# Patient Record
Sex: Female | Born: 1960 | Race: White | Hispanic: No | Marital: Single | Smoking: Never smoker
Health system: Southern US, Community
[De-identification: ages and names within clinical notes are randomized; demographics above are authoritative.]

## PROBLEM LIST (undated history)

## (undated) DIAGNOSIS — Z22322 Carrier or suspected carrier of Methicillin resistant Staphylococcus aureus: Secondary | ICD-10-CM

## (undated) DIAGNOSIS — D8940 Mast cell activation, unspecified: Secondary | ICD-10-CM

## (undated) DIAGNOSIS — I456 Pre-excitation syndrome: Secondary | ICD-10-CM

## (undated) DIAGNOSIS — E079 Disorder of thyroid, unspecified: Secondary | ICD-10-CM

## (undated) DIAGNOSIS — I441 Atrioventricular block, second degree: Secondary | ICD-10-CM

## (undated) DIAGNOSIS — F419 Anxiety disorder, unspecified: Secondary | ICD-10-CM

## (undated) DIAGNOSIS — D839 Common variable immunodeficiency, unspecified: Secondary | ICD-10-CM

## (undated) LAB — HM DIABETES EYE EXAM: HM Diabetes Eye Exam: NORMAL

## (undated) LAB — HM DM FOOT MONOFILAMENT AND PULSE

## (undated) LAB — GLUCOSE POC NURSING: POC Glucose: 133

## (undated) LAB — HEMOGLOBIN A1C: Hemoglobin A1C: 6.6

## (undated) MED FILL — NEBULIZER AND COMPRESSOR: 1.00 1.00 ampule | Qty: 1 | Fill #0

## (undated) MED FILL — VILAZODONE 10 MG (7)-20 MG (23) TABLETS IN A TITRATION PACK: 10 10 mg (7)- 20 mg (23) | ORAL | 30 days supply | Qty: 30 | Fill #0

## (undated) MED FILL — LEVALBUTEROL 1.25 MG/3 ML SOLUTION FOR NEBULIZATION: 1.25 1.25 mg/3 mL | RESPIRATORY_TRACT | 6 days supply | Qty: 72 | Fill #0

## (undated) MED FILL — CROMOLYN 100 MG/5 ML ORAL CONCENTRATE: 100 100 mg/5 mL | ORAL | 32 days supply | Qty: 960 | Fill #0

## (undated) MED FILL — CETIRIZINE 10 MG TABLET: 10 10 MG | ORAL | 30 days supply | Qty: 90 | Fill #0

## (undated) MED FILL — DIPHENHYDRAMINE 50 MG CAPSULE: 50 50 MG | ORAL | 30 days supply | Qty: 90 | Fill #0

---

## 2015-09-27 ENCOUNTER — Encounter (HOSPITAL_COMMUNITY): Payer: Self-pay | Admitting: Emergency Medicine

## 2015-09-27 ENCOUNTER — Emergency Department (HOSPITAL_COMMUNITY): Payer: Self-pay

## 2015-09-27 ENCOUNTER — Emergency Department (HOSPITAL_COMMUNITY)
Admission: EM | Admit: 2015-09-27 | Discharge: 2015-09-27 | Disposition: A | Discharge status: Home / Self Care | Payer: Self-pay | Attending: Emergency Medicine | Admitting: Emergency Medicine

## 2015-09-27 DIAGNOSIS — R002 Palpitations: Secondary | ICD-10-CM | POA: Insufficient documentation

## 2015-09-27 HISTORY — DX: Atrioventricular block, second degree: I44.1

## 2015-09-27 HISTORY — DX: Anxiety disorder, unspecified: F41.9

## 2015-09-27 HISTORY — DX: Mast cell activation, unspecified: D89.40

## 2015-09-27 HISTORY — DX: Carrier or suspected carrier of methicillin resistant Staphylococcus aureus: Z22.322

## 2015-09-27 HISTORY — DX: Pre-excitation syndrome: I45.6

## 2015-09-27 HISTORY — DX: Common variable immunodeficiency, unspecified: D83.9

## 2015-09-27 HISTORY — DX: Disorder of thyroid, unspecified: E07.9

## 2015-09-27 LAB — BASIC METABOLIC PANEL
Anion gap: 8 (ref 5–15)
BUN: <5 mg/dL — ABNORMAL LOW (ref 6–20)
CO2: 26 mmol/L (ref 22–32)
Calcium: 8.8 mg/dL — ABNORMAL LOW (ref 8.9–10.3)
Chloride: 105 mmol/L (ref 101–111)
Creatinine, Ser: 0.64 mg/dL (ref 0.44–1.00)
GFR calc Af Amer: >60 mL/min (ref >60)
GFR calc non Af Amer: >60 mL/min (ref >60)
Glucose, Bld: 91 mg/dL (ref 65–99)
Potassium: 3.1 mmol/L — ABNORMAL LOW (ref 3.5–5.1)
Sodium: 139 mmol/L (ref 135–145)

## 2015-09-27 LAB — CBC
HCT: 40.5 % (ref 36.0–46.0)
Hemoglobin: 12.5 g/dL (ref 12.0–15.0)
MCH: 29.1 pg (ref 26.0–34.0)
MCHC: 30.9 g/dL (ref 30.0–36.0)
MCV: 94.2 fL (ref 78.0–100.0)
Platelets: 388 K/uL (ref 150–400)
RBC: 4.3 MIL/uL (ref 3.87–5.11)
RDW: 14 % (ref 11.5–15.5)
WBC: 9.3 K/uL (ref 4.0–10.5)

## 2015-09-27 MED ORDER — POTASSIUM CHLORIDE CRYS ER 20 MEQ PO TBCR
40.0000 meq | EXTENDED_RELEASE_TABLET | Freq: Once | ORAL | Status: AC
  Administered 2015-09-27: 40 meq via ORAL
  Filled 2015-09-27: qty 2

## 2015-09-27 MED ORDER — SODIUM CHLORIDE 0.9 % IV BOLUS (SEPSIS)
1000.0000 mL | Freq: Once | INTRAVENOUS | Status: AC
  Administered 2015-09-27: 1000 mL via INTRAVENOUS

## 2015-09-27 MED ORDER — METHYLPREDNISOLONE 4 MG PO TBPK: ORAL_TABLET | ORAL | 0 refills | Status: DC

## 2015-09-27 MED ORDER — METHYLPREDNISOLONE SODIUM SUCC 125 MG IJ SOLR
125.0000 mg | Freq: Once | INTRAMUSCULAR | Status: AC
  Administered 2015-09-27: 125 mg via INTRAVENOUS
  Filled 2015-09-27: qty 2

## 2015-09-27 NOTE — ED Notes (Signed)
Patient transported to X-ray 

## 2015-09-27 NOTE — ED Triage Notes (Signed)
Pt. Stated, I have MRSA July 2nd and a month of hospital for the MRSA and it triggers my heart palpitations . 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

## 2015-09-27 NOTE — ED Notes (Signed)
Pt given turkey sandwich and coke 

## 2015-09-27 NOTE — ED Provider Notes (Signed)
[facility]-EMERGENCY DEPT Provider Note   CSN: [id number] Arrival date & time: 09/27/15  1348  By signing my name below, I, Joanna Rojas, attest that this documentation has been prepared under the direction and in the presence of Raeford RazorStephen Makih Stefanko, MD. Electronically Signed: Sonum Rojas, Neurosurgeoncribe. 09/27/15. 3:39 PM.  History   Chief Complaint Chief Complaint  Patient presents with  . Irregular Heart Beat  . Blood Infection   The history is provided by the patient. No language interpreter was used.     HPI Comments: Joanna Rojas is a 55 y.o. female with past medical history of mast cell activation syndrome, MRSA who presents to the Emergency Department complaining of intermittent palpitations that has been ongoing for the last 3 days. She describes her symptoms as a fluttering sensation and believes she had PVC/PAC every 4th beat yesterday. She reports also having facial swelling that started a few days ago. She had BLE fractures in June 2017 that were surgically repaired in [place]. When she was discharged from the hospital she was started on a 21 day course of Cipro for onset of pustules to different areas of her body. She reports having MRSA related pustules to the right knee today and to the face that began yesterday.  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 She denies chest pain, SOB.   Past Medical History:  Diagnosis Date  . Anxiety   . CVID (common variable immunodeficiency) (HCC)   . Heart block AV second degree   . Mast cell activation (HCC)   . MRSA (methicillin resistant staph aureus) culture positive   . Thyroid disease   . 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   History reviewed. No pertinent surgical history.  OB History    No data available       Home Medications    Prior to Admission medications   Not on File    Family History No family history  on file.  Social History Social History  Substance Use Topics  . Smoking status: Never Smoker  . Smokeless tobacco: Never Used  . Alcohol use No     Allergies   Review of patient's allergies indicates not on file.   Review of Systems Review of Systems  HENT: Positive for facial swelling.   Respiratory: Negative for shortness of breath.   Cardiovascular: Positive for palpitations. Negative for chest pain.  Skin: Positive for wound.  All other systems reviewed and are negative.    Physical Exam Updated Vital Signs BP 128/79 (BP Location: Left Arm)   Pulse 103   Temp 98.3 F (36.8 C) (Oral)   Resp 14   SpO2 99%   Physical Exam  Constitutional: She is oriented to person, place, and time. She appears well-developed and well-nourished. No distress.  HENT:  Head: Normocephalic and atraumatic.  Eyes: EOM are normal.  Neck: Normal range of motion.  Cardiovascular: Normal rate, regular rhythm and normal heart sounds.   Pulmonary/Chest: Effort normal and breath sounds normal.  Abdominal: Soft. She exhibits no distension. There is no tenderness.  Musculoskeletal: Normal range of motion.  Neurological: She is alert and oriented to person, place, and time.  Skin: Skin is warm and dry.  Small pustule to right knee. No surrounding cellulitis. Very small ulceration to the dorsal aspect of the left index finger. Scaled and scabbed lesion to lower lip.   Psychiatric: She has  a normal mood and affect. Judgment normal.  Nursing note and vitals reviewed.    ED Treatments / Results  DIAGNOSTIC STUDIES: Oxygen Saturation is 99% on RA, normal by my interpretation.    COORDINATION OF CARE: 3:39 PM Discussed treatment plan with pt at bedside and pt agreed to plan.   Labs (all labs ordered are listed, but only abnormal results are displayed) Labs Reviewed  CBC  BASIC METABOLIC PANEL    EKG  EKG Interpretation  Date/Time:  Saturday September 27 2015 13:54:52 EDT Ventricular  Rate:  102 PR Interval:    QRS Duration: 80 QT Interval:  532 QTC Calculation: 693 R Axis:   78 Text Interpretation:  Sinus rhythm Premature ventricular complexes Nonspecific ST and T wave abnormality Prolonged QT Abnormal ECG Confirmed by Juleen China  MD, Jeannett Senior ([id number]) on 09/27/2015 3:14:48 PM       Radiology No results found.  Procedures Procedures (including critical care time)  Medications Ordered in ED Medications - No data to display   Initial Impression / Assessment and Plan / ED Course  I have reviewed the triage vital signs and the nursing notes.  Pertinent labs & imaging results that were available during my care of the patient were reviewed by me and considered in my medical decision making (see chart for details).  Clinical Course   55yF with palpitations. Likely feeling PVCs. She has several other complaints including skin lesions. Sparse red papules to extremities. They appear in differing ages of chronicity. No cellulitis. She is currently on abx. I see no reason to change therapy.    Final Clinical Impressions(s) / ED Diagnoses   Final diagnoses:  Palpitations    New Prescriptions New Prescriptions   No medications on file   I personally preformed the services scribed in my presence. The recorded information has been reviewed is accurate. Raeford Razor, MD.    Raeford Razor, MD 10/01/15 [phone]

## 2015-11-18 ENCOUNTER — Encounter (HOSPITAL_COMMUNITY): Payer: Self-pay

## 2015-11-18 ENCOUNTER — Emergency Department (HOSPITAL_COMMUNITY): Payer: Medicare Other

## 2015-11-18 ENCOUNTER — Emergency Department (HOSPITAL_COMMUNITY)
Admission: EM | Admit: 2015-11-18 | Discharge: 2015-11-18 | Disposition: A | Discharge status: Home / Self Care | Payer: Medicare Other | Attending: Emergency Medicine | Admitting: Emergency Medicine

## 2015-11-18 DIAGNOSIS — Z79899 Other long term (current) drug therapy: Secondary | ICD-10-CM | POA: Insufficient documentation

## 2015-11-18 DIAGNOSIS — Y929 Unspecified place or not applicable: Secondary | ICD-10-CM | POA: Insufficient documentation

## 2015-11-18 DIAGNOSIS — S39012A Strain of muscle, fascia and tendon of lower back, initial encounter: Secondary | ICD-10-CM | POA: Diagnosis not present

## 2015-11-18 DIAGNOSIS — S199XXA Unspecified injury of neck, initial encounter: Secondary | ICD-10-CM | POA: Diagnosis present

## 2015-11-18 DIAGNOSIS — W19XXXA Unspecified fall, initial encounter: Secondary | ICD-10-CM | POA: Insufficient documentation

## 2015-11-18 DIAGNOSIS — S161XXA Strain of muscle, fascia and tendon at neck level, initial encounter: Secondary | ICD-10-CM | POA: Diagnosis not present

## 2015-11-18 DIAGNOSIS — R51 Headache: Secondary | ICD-10-CM | POA: Insufficient documentation

## 2015-11-18 DIAGNOSIS — Y939 Activity, unspecified: Secondary | ICD-10-CM | POA: Diagnosis not present

## 2015-11-18 DIAGNOSIS — Y999 Unspecified external cause status: Secondary | ICD-10-CM | POA: Insufficient documentation

## 2015-11-18 MED ORDER — METHYLPREDNISOLONE SODIUM SUCC 125 MG IJ SOLR
125.0000 mg | Freq: Once | INTRAMUSCULAR | Status: AC
  Administered 2015-11-18: 125 mg via INTRAVENOUS
  Filled 2015-11-18: qty 2

## 2015-11-18 MED ORDER — HYDROMORPHONE HCL 1 MG/ML IJ SOLN
1.0000 mg | Freq: Once | INTRAMUSCULAR | Status: AC
  Administered 2015-11-18: 1 mg via INTRAVENOUS
  Filled 2015-11-18: qty 1

## 2015-11-18 MED ORDER — DIPHENHYDRAMINE HCL 50 MG/ML IJ SOLN
50.0000 mg | Freq: Once | INTRAMUSCULAR | Status: AC
  Administered 2015-11-18: 50 mg via INTRAVENOUS
  Filled 2015-11-18: qty 1

## 2015-11-18 MED ORDER — SODIUM CHLORIDE 0.9 % IV SOLN
40.0000 mg | Freq: Once | INTRAVENOUS | Status: AC
  Administered 2015-11-18: 40 mg via INTRAVENOUS
  Filled 2015-11-18: qty 4

## 2015-11-18 NOTE — Progress Notes (Signed)
Staffed with ED Secretary and consult if for case management.  Elenore PaddyLaVonia Roel Douthat, LCSWA [phone] ED CSW 11/18/2015 8:24 AM

## 2015-11-18 NOTE — ED Notes (Signed)
Pt lives alone, the last thing patient remembers getting up to go to the bathroom and then waking up on the floor. Pt complains of neck and back pain from the fall more than she's normally hurts.

## 2015-11-18 NOTE — ED Provider Notes (Addendum)
[facility]-EMERGENCY DEPT Provider Note   CSN: [id number] Arrival date & time: 11/18/15  0535     History   Chief Complaint Chief Complaint  Patient presents with  . Loss of Consciousness    HPI Joanna Rojas is a 55 y.o. female.  Patient with history of mast cell activation resulting in frequent syncope presents to the ER after a syncopal episode. Patient reports that she did go to the bathroom, woke up on the floor. Patient is complaining of pain in her lower back as well as head and neck.  Patient also has a history of common variable immunodeficiency. She feels like her IgG is low and needs H1 and H2-blockers. She used to be on these continuously, but recently had MRSA infection causing hospitalization and her chronic IV access was removed.      Past Medical History:  Diagnosis Date  . Anxiety   . CVID (common variable immunodeficiency) (HCC)   . Heart block AV second degree   . Mast cell activation (HCC)   . MRSA (methicillin resistant staph aureus) culture positive   . Thyroid disease   . 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   History reviewed. No pertinent surgical history.  OB History    No data available       Home Medications    Prior to Admission medications   Medication Sig Start Date End Date Taking? Authorizing Provider  cetirizine (ZYRTEC) 10 MG tablet Take 20 mg by mouth every 8 (eight) hours.    Historical Provider, MD  clonazePAM (KLONOPIN) 1 MG tablet Take 1 mg by mouth 3 (three) times daily as needed for anxiety.    Historical Provider, MD  diphenhydrAMINE (BENADRYL) 50 MG/ML injection Inject 50 mg into the muscle See admin instructions. EVERY 4-6 HOURS AS NEEDED    Historical Provider, MD  ezetimibe (ZETIA) 10 MG tablet Take 10 mg by mouth every morning.    Historical Provider, MD  fexofenadine (ALLEGRA) 180 MG tablet Take 180 mg by mouth at bedtime.     Historical Provider, MD  furosemide (LASIX) 20 MG tablet Take 20-40 mg by mouth every morning. DEPENDING ON SWELLING    Historical Provider, MD  levalbuterol (XOPENEX) 1.25 MG/3ML nebulizer solution Take 1.25 mg by nebulization every 6 (six) hours as needed for wheezing or shortness of breath.    Historical Provider, MD  levothyroxine (SYNTHROID, LEVOTHROID) 112 MCG tablet Take 112 mcg by mouth daily before breakfast.    Historical Provider, MD  methylPREDNISolone (MEDROL DOSEPAK) 4 MG TBPK tablet 6,5,4,3,2,1 tablets daily. 09/27/15   Raeford RazorStephen Kohut, MD  montelukast (SINGULAIR) 10 MG tablet Take 10 mg by mouth at bedtime.    Historical Provider, MD  omeprazole (PRILOSEC) 40 MG capsule Take 40 mg by mouth 2 (two) times daily.    Historical Provider, MD  OnabotulinumtoxinA (BOTOX IM) Inject 400 Units into the muscle every 3 (three) months.    Historical Provider, MD  ranitidine (ZANTAC) 300 MG tablet Take 300 mg by mouth 3 (three) times daily.    Historical Provider, MD    Family History No family history on file.  Social History Social History  Substance Use Topics  . Smoking status: Never Smoker  . Smokeless tobacco: Never Used  . Alcohol use No     Allergies   Asa [aspirin]; Latex; Lidocaine; Lyrica [pregabalin]; Other; and Tape   Review of Systems Review of Systems  Musculoskeletal: Positive for back pain and neck pain.  Neurological: Positive for syncope and headaches.  All other systems reviewed and are negative.    Physical Exam Updated Vital Signs BP 139/97 (BP Location: Left Arm)   Pulse 67   Temp 98.3 F (36.8 C) (Oral)   Resp 18   Ht 5\' 7"  (1.702 m)   Wt 158 lb (71.7 kg)   SpO2 100%   BMI 24.75 kg/m   Physical Exam  Constitutional: She is oriented to person, place, and time. She appears well-developed and well-nourished. No distress.  HENT:  Head: Normocephalic and atraumatic.  Right Ear: Hearing normal.  Left Ear: Hearing normal.  Nose: Nose normal.    Mouth/Throat: Oropharynx is clear and moist and mucous membranes are normal.  Eyes: Conjunctivae and EOM are normal. Pupils are equal, round, and reactive to light.  Neck: Normal range of motion. Neck supple. Muscular tenderness present.  Cardiovascular: Regular rhythm, S1 normal and S2 normal.  Exam reveals no gallop and no friction rub.   No murmur heard. Pulmonary/Chest: Effort normal and breath sounds normal. No respiratory distress. She exhibits no tenderness.  Abdominal: Soft. Normal appearance and bowel sounds are normal. There is no hepatosplenomegaly. There is no tenderness. There is no rebound, no guarding, no tenderness at McBurney's point and negative Murphy's sign. No hernia.  Musculoskeletal: Normal range of motion.       Lumbar back: She exhibits tenderness.       Back:  Neurological: She is alert and oriented to person, place, and time. She has normal strength. No cranial nerve deficit or sensory deficit. Coordination normal. GCS eye subscore is 4. GCS verbal subscore is 5. GCS motor subscore is 6.  Skin: Skin is warm, dry and intact. No rash noted. No cyanosis.  Psychiatric: She has a normal mood and affect. Her speech is normal and behavior is normal. Thought content normal.  Nursing note and vitals reviewed.    ED Treatments / Results  Labs (all labs ordered are listed, but only abnormal results are displayed) Labs Reviewed - No data to display  EKG  EKG Interpretation  Date/Time:  Tuesday November 18 2015 07:04:19 EDT Ventricular Rate:  70 PR Interval:    QRS Duration: 92 QT Interval:  478 QTC Calculation: 516 R Axis:   57 Text Interpretation:  Sinus rhythm RSR' in V1 or V2, probably normal variant Nonspecific ST and T wave abnormality Prolonged QT interval No significant change since last tracing Confirmed by Blinda LeatherwoodPOLLINA  MD, Arvie Bartholomew [phone]([id number]) on 11/18/2015 7:20:10 AM       Radiology Dg Lumbar Spine Complete  Result Date: 11/18/2015 CLINICAL DATA:   Unwitnessed fall tonight. EXAM: LUMBAR SPINE - COMPLETE 4+ VIEW COMPARISON:  None. FINDINGS: There is no evidence of lumbar spine fracture. Alignment is normal. Mild degenerative lumbar disc disease, greatest at L5-S1. Sacroiliac joints appear unremarkable. IMPRESSION: Negative for acute lumbar spine fracture. Electronically Signed   By: Ellery Plunkaniel R Mitchell M.D.   On: 11/18/2015 07:00   Ct Head Wo Contrast  Result Date: 11/18/2015 CLINICAL DATA:  Unwitnessed fall. EXAM: CT HEAD WITHOUT CONTRAST CT CERVICAL SPINE WITHOUT CONTRAST TECHNIQUE: Multidetector CT imaging of the head and cervical spine was performed following the standard protocol without intravenous contrast. Multiplanar CT image reconstructions of the cervical spine were also generated. COMPARISON:  None. FINDINGS: CT HEAD FINDINGS Brain: No evidence of acute infarction, hemorrhage, hydrocephalus, extra-axial collection or mass lesion/mass effect. Gray matter and white matter are unremarkable, with normal differentiation.  Brain volume is normal for age. Vascular: No hyperdense vessel or unexpected calcification. Skull: Normal. Negative for fracture or focal lesion. Sinuses/Orbits: No acute finding. CT CERVICAL SPINE FINDINGS Alignment: The cervical vertebrae are normal in height and alignment. Skull base and vertebrae: No acute fracture. No primary bone lesion or focal pathologic process. Soft tissues and spinal canal: No prevertebral fluid or swelling. No visible canal hematoma. Disc levels: Moderately severe degenerative cervical disc disease from C3 through C7. Upper chest: No significant abnormality. IMPRESSION: 1. Normal brain. 2. Negative for acute cervical spine fracture. Moderately severe cervical degenerative disc disease. Electronically Signed   By: Ellery Plunk M.D.   On: 11/18/2015 06:57   Ct Cervical Spine Wo Contrast  Result Date: 11/18/2015 CLINICAL DATA:  Unwitnessed fall. EXAM: CT HEAD WITHOUT CONTRAST CT CERVICAL SPINE  WITHOUT CONTRAST TECHNIQUE: Multidetector CT imaging of the head and cervical spine was performed following the standard protocol without intravenous contrast. Multiplanar CT image reconstructions of the cervical spine were also generated. COMPARISON:  None. FINDINGS: CT HEAD FINDINGS Brain: No evidence of acute infarction, hemorrhage, hydrocephalus, extra-axial collection or mass lesion/mass effect. Gray matter and white matter are unremarkable, with normal differentiation. Brain volume is normal for age. Vascular: No hyperdense vessel or unexpected calcification. Skull: Normal. Negative for fracture or focal lesion. Sinuses/Orbits: No acute finding. CT CERVICAL SPINE FINDINGS Alignment: The cervical vertebrae are normal in height and alignment. Skull base and vertebrae: No acute fracture. No primary bone lesion or focal pathologic process. Soft tissues and spinal canal: No prevertebral fluid or swelling. No visible canal hematoma. Disc levels: Moderately severe degenerative cervical disc disease from C3 through C7. Upper chest: No significant abnormality. IMPRESSION: 1. Normal brain. 2. Negative for acute cervical spine fracture. Moderately severe cervical degenerative disc disease. Electronically Signed   By: Ellery Plunk M.D.   On: 11/18/2015 06:57    Procedures Procedures (including critical care time)  Medications Ordered in ED Medications  famotidine (PEPCID) 40 mg in sodium chloride 0.9 % 50 mL IVPB (40 mg Intravenous New Bag/Given 11/18/15 0702)  HYDROmorphone (DILAUDID) injection 1 mg (not administered)  diphenhydrAMINE (BENADRYL) injection 50 mg (50 mg Intravenous Given 11/18/15 0653)  methylPREDNISolone sodium succinate (SOLU-MEDROL) 125 mg/2 mL injection 125 mg (125 mg Intravenous Given 11/18/15 0653)     Initial Impression / Assessment and Plan / ED Course  I have reviewed the triage vital signs and the nursing notes.  Pertinent labs & imaging results that were available during  my care of the patient were reviewed by me and considered in my medical decision making (see chart for details).  Clinical Course   Patient presents for evaluation after a fall. Patient had a syncopal episode which is common for her because of her multiple medical problems. She was complaining of headache, neck pain and low back pain. CT head, CT cervical spine, lumbar spine x-ray negative. Patient initially declined pain medication. After being in the ER for a while, however, her pain increased and she was given a milligram of IV Dilaudid. She reports a history of allergies to multiple analgesics but has tolerated Dilaudid in the past.  Patient reports that she feels like she is experiencing low IgG secondary to her common variable immunodeficiency. She is generally treated with H1, H2 blockers and Solu-Medrol. She was administered IV medications here in the ER for this.  Final Clinical Impressions(s) / ED Diagnoses   Final diagnoses:  Cervical strain, acute, initial encounter  Lumbar strain, initial encounter  New Prescriptions New Prescriptions   No medications on file     Gilda Crease, MD 11/18/15 1191    Gilda Crease, MD 11/18/15 [phone]

## 2015-11-18 NOTE — Progress Notes (Signed)
CM consult called by ED unit secretary and ED Rn Ikrame   Pt from [place] moved to [place] temporary and wants medicare accepting MD but does not want to see a NP Has a 7 page list of medicare providers and unable tofind MD she wants Has appt in [facility] in 3 months for "her special needs" Pt to leave her number so if cm finds an earlier appt Cmwill share with her Apll apppt Cm found iin December  Pt has all her OTC meds Does not need Meds from CM

## 2015-11-18 NOTE — ED Notes (Signed)
IV access attempted x three, unsuccessful

## 2015-11-18 NOTE — ED Notes (Signed)
Pt states that if she doesn't eat every 2 hours that she will have a reaction. Nurse gave pt a sandwich and drink

## 2015-11-19 ENCOUNTER — Telehealth: Payer: Self-pay | Admitting: *Deleted

## 2015-11-21 ENCOUNTER — Ambulatory Visit: Payer: Self-pay | Admitting: Allergy and Immunology

## 2015-11-25 ENCOUNTER — Encounter (HOSPITAL_COMMUNITY): Payer: Self-pay | Admitting: Emergency Medicine

## 2015-11-25 ENCOUNTER — Emergency Department (HOSPITAL_COMMUNITY)
Admission: EM | Admit: 2015-11-25 | Discharge: 2015-11-25 | Disposition: A | Discharge status: Home / Self Care | Payer: Medicare PPO | Attending: Emergency Medicine | Admitting: Emergency Medicine

## 2015-11-25 DIAGNOSIS — D8940 Mast cell activation, unspecified: Secondary | ICD-10-CM | POA: Diagnosis not present

## 2015-11-25 DIAGNOSIS — Z91040 Latex allergy status: Secondary | ICD-10-CM | POA: Diagnosis not present

## 2015-11-25 DIAGNOSIS — R0602 Shortness of breath: Secondary | ICD-10-CM | POA: Diagnosis present

## 2015-11-25 MED ORDER — METHYLPREDNISOLONE 4 MG PO TBPK: ORAL_TABLET | ORAL | 0 refills | Status: DC

## 2015-11-25 MED ORDER — FAMOTIDINE IN NACL 20-0.9 MG/50ML-% IV SOLN
20.0000 mg | Freq: Once | INTRAVENOUS | Status: AC
  Administered 2015-11-25: 20 mg via INTRAVENOUS

## 2015-11-25 MED ORDER — DIPHENHYDRAMINE HCL 50 MG/ML IJ SOLN
INTRAMUSCULAR | Status: AC
  Filled 2015-11-25: qty 1

## 2015-11-25 MED ORDER — LEVALBUTEROL HCL 1.25 MG/0.5ML IN NEBU
1.2500 mg | INHALATION_SOLUTION | Freq: Once | RESPIRATORY_TRACT | Status: AC
  Administered 2015-11-25: 1.25 mg via RESPIRATORY_TRACT
  Filled 2015-11-25: qty 0.5

## 2015-11-25 MED ORDER — METHYLPREDNISOLONE SODIUM SUCC 125 MG IJ SOLR
INTRAMUSCULAR | Status: AC
  Filled 2015-11-25: qty 2

## 2015-11-25 MED ORDER — METHYLPREDNISOLONE SODIUM SUCC 125 MG IJ SOLR
125.0000 mg | Freq: Once | INTRAMUSCULAR | Status: AC
  Administered 2015-11-25: 125 mg via INTRAVENOUS

## 2015-11-25 MED ORDER — DIPHENHYDRAMINE HCL 50 MG/ML IJ SOLN
50.0000 mg | Freq: Once | INTRAMUSCULAR | Status: AC
  Administered 2015-11-25: 50 mg via INTRAVENOUS

## 2015-11-25 MED ORDER — DIPHENHYDRAMINE HCL 50 MG/ML IJ SOLN
25.0000 mg | Freq: Once | INTRAMUSCULAR | Status: AC
  Administered 2015-11-25: 25 mg via INTRAMUSCULAR
  Filled 2015-11-25: qty 1

## 2015-11-25 MED ORDER — SODIUM CHLORIDE 0.9 % IV BOLUS (SEPSIS)
500.0000 mL | Freq: Once | INTRAVENOUS | Status: AC
  Administered 2015-11-25: 500 mL via INTRAVENOUS

## 2015-11-25 NOTE — ED Notes (Signed)
Pt states that she is feeling better but she is complaining of her vision being "a little blurry"

## 2015-11-25 NOTE — ED Notes (Signed)
Pt is sitting comfortably in room but states " I am doing fine, just meditating. But my body is still reacting because me incision on my leg is still red and her lips still hurt"

## 2015-11-25 NOTE — ED Notes (Signed)
Patient ambulated with steady gait to restroom and back

## 2015-11-25 NOTE — ED Triage Notes (Signed)
Patient presents to ED via POV with SOB and anxiety. She states she has mast cell activation syndrome and is allergic to a lot of medications. She reports her stomach is "normally flat, but is bloated because there are mast cells in there." Patient very talkative and anxious at this time, airway intact. Tachypneic on arrival, resp equal and labored. A&O x 4. Ambulatory.

## 2015-11-25 NOTE — ED Notes (Signed)
Allergy for Alcohol is added

## 2015-11-25 NOTE — ED Notes (Signed)
Attempted to remove IV. Used alcohol. With a touch, patient states: "I have allergy to alcohol". It was cleaned off from the skin. MD notified. Pt requested Benadryl, which was given IM. Pt states: "I feel a little bit better"

## 2015-11-25 NOTE — ED Provider Notes (Signed)
[facility]PT Provider Note   CSN: [id number] Arrival date & time: 11/25/15  1814     History   Chief Complaint Chief Complaint  Patient presents with  . Shortness of Breath    HPI Joanna Rojas is a 55 y.o. female.  Patient presents with wheezing and tingling sensation in her lips. History of mastocytosis/mast cell activation and reports that she was exposed to a fragrance while in a taxi which is a typical symptom of hers. Reports this was sudden in onset. No other known allergens. She was previously well before the exposure.   The history is provided by the patient. No language interpreter was used.  Wheezing   This is a recurrent problem. The current episode started less than 1 hour ago. The problem occurs constantly. The problem has not changed since onset.Pertinent negatives include no chest pain, no fever, no vomiting, no diarrhea, no swollen glands, no cough, no sputum production and no rash. The problem's precipitants include strong odors. She has tried nothing for the symptoms. The treatment provided no relief. She has had prior ED visits. Past medical history comments: History of mastocytosis.    Past Medical History:  Diagnosis Date  . Anxiety   . CVID (common variable immunodeficiency) (HCC)   . Heart block AV second degree   . Mast cell activation (HCC)   . MRSA (methicillin resistant staph aureus) culture positive   . Thyroid disease   . 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   History reviewed. No pertinent surgical history.  OB History    No data available       Home Medications    Prior to Admission medications   Medication Sig Start Date End Date Taking? Authorizing Provider  amoxicillin (AMOXIL) 875 MG tablet Take 875 mg by mouth 2 (two) times daily. 11/17/15 11/27/15 Yes Historical Provider, MD  busPIRone (BUSPAR) 15 MG tablet Take 15 mg by mouth 3 (three)  times daily.   Yes Historical Provider, MD  cetirizine (ZYRTEC) 10 MG tablet Take 20 mg by mouth every 8 (eight) hours.   Yes Historical Provider, MD  clonazePAM (KLONOPIN) 1 MG tablet Take 1 mg by mouth 3 (three) times daily as needed for anxiety.   Yes Historical Provider, MD  cromolyn (GASTROCROM) 100 MG/5ML solution Take 100 mg by mouth 4 (four) times daily -  before meals and at bedtime.   Yes Historical Provider, MD  diphenhydrAMINE (BENADRYL) 50 MG/ML injection Inject 50 mg into the muscle See admin instructions. EVERY 4-6 HOURS AS NEEDED   Yes Historical Provider, MD  ezetimibe (ZETIA) 10 MG tablet Take 10 mg by mouth every morning.   Yes Historical Provider, MD  fexofenadine (ALLEGRA) 180 MG tablet Take 180 mg by mouth at bedtime.   Yes Historical Provider, MD  furosemide (LASIX) 20 MG tablet Take 20-40 mg by mouth every morning. DEPENDING ON SWELLING   Yes Historical Provider, MD  immune globulin, human, (GAMMAGARD S/D) 5 g injection Inject 6 g into the vein every 7 (seven) days.   Yes Historical Provider, MD  levalbuterol (XOPENEX) 1.25 MG/3ML nebulizer solution Take 1.25 mg by nebulization every 6 (six) hours as needed for wheezing or shortness of breath.   Yes Historical Provider, MD  levothyroxine (SYNTHROID, LEVOTHROID) 112 MCG tablet Take 112 mcg by mouth daily before breakfast.   Yes Historical Provider, MD  montelukast (SINGULAIR) 10 MG tablet Take 10 mg  by mouth at bedtime.   Yes Historical Provider, MD  omeprazole (PRILOSEC) 40 MG capsule Take 40 mg by mouth 2 (two) times daily.   Yes Historical Provider, MD  OnabotulinumtoxinA (BOTOX IM) Inject 400 Units into the muscle every 3 (three) months.   Yes Historical Provider, MD  ranitidine (ZANTAC) 300 MG tablet Take 300 mg by mouth 3 (three) times daily.   Yes Historical Provider, MD  methylPREDNISolone (MEDROL DOSEPAK) 4 MG TBPK tablet 6,5,4,3,2,1 tablets daily. 11/25/15   Preston FleetingAnna Kaesha Kirsch, MD    Family History No family history on  file.  Social History Social History  Substance Use Topics  . Smoking status: Never Smoker  . Smokeless tobacco: Never Used  . Alcohol use No     Allergies   Alcohol-sulfur [sulfur]; Asa [aspirin]; Epinephrine; Latex; Lidocaine; Lyrica [pregabalin]; Onion; Other; Red dye; Yellow dye; and Tape   Review of Systems Review of Systems  Constitutional: Negative for fever.  HENT: Negative.   Respiratory: Positive for wheezing. Negative for cough and sputum production.   Cardiovascular: Negative for chest pain.  Gastrointestinal: Negative for diarrhea and vomiting.  Genitourinary: Negative.   Musculoskeletal: Negative.   Skin: Negative for rash.  Allergic/Immunologic: Negative for immunocompromised state.  Neurological:       Tingling  Hematological: Does not bruise/bleed easily.  Psychiatric/Behavioral: Negative.      Physical Exam Updated Vital Signs BP 145/89 (BP Location: Left Arm)   Pulse 89   Temp 98.3 F (36.8 C) (Oral)   Resp 19   Ht 5\' 7"  (1.702 m)   Wt 71.7 kg   SpO2 96%   BMI 24.75 kg/m   Physical Exam  Constitutional: She appears well-developed and well-nourished. She is cooperative.  Non-toxic appearance. She appears distressed.  HENT:  Head: Normocephalic and atraumatic.  Mild facial flushing, no angioedema  Eyes: Conjunctivae and EOM are normal. Pupils are equal, round, and reactive to light.  Neck: Normal range of motion. Neck supple.  Cardiovascular: Regular rhythm and normal pulses.  Tachycardia present.   Pulmonary/Chest: No accessory muscle usage. Tachypnea noted. No respiratory distress. She has no decreased breath sounds. She has wheezes in the right middle field, the right lower field, the left middle field and the left lower field. She has no rhonchi. She has no rales.  Abdominal: Soft. She exhibits no distension. There is no tenderness. There is no rebound and no guarding.  Musculoskeletal: Normal range of motion.  Neurological: She is alert.    Skin: No rash noted. She is not diaphoretic.     ED Treatments / Results  Labs (all labs ordered are listed, but only abnormal results are displayed) Labs Reviewed - No data to display  EKG  EKG Interpretation None       Radiology No results found.  Procedures Procedures (including critical care time)  Medications Ordered in ED Medications  diphenhydrAMINE (BENADRYL) injection 50 mg (50 mg Intravenous Given 11/25/15 1821)  famotidine (PEPCID) IVPB 20 mg premix (0 mg Intravenous Stopped 11/25/15 1917)  methylPREDNISolone sodium succinate (SOLU-MEDROL) 125 mg/2 mL injection 125 mg (125 mg Intravenous Given 11/25/15 1821)  levalbuterol (XOPENEX) nebulizer solution 1.25 mg (1.25 mg Nebulization Given 11/25/15 1856)  sodium chloride 0.9 % bolus 500 mL (0 mLs Intravenous Stopped 11/25/15 2154)  diphenhydrAMINE (BENADRYL) injection 25 mg (25 mg Intramuscular Given 11/25/15 2222)     Initial Impression / Assessment and Plan / ED Course  I have reviewed the triage vital signs and the nursing notes.  Pertinent  labs & imaging results that were available during my care of the patient were reviewed by me and considered in my medical decision making (see chart for details).  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 On arrival she has wheezes and mild facial flushing but no rash, no angioedema. Do not suspect anaphylaxis based on her known history. She was given a dose of benadryl, solumedrol, and pepcid as well as IVF and had complete resolution of symptoms. She was observed for 3 hours and did have mild irritation from an alcohol wipe she was given another dose of IM benadryl, but otherwise felt well and back to baseline. She was given return precautions for worsening symptoms and expressed understanding. She is in good condition for discharge  home.  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

## 2015-11-27 ENCOUNTER — Emergency Department (HOSPITAL_COMMUNITY)
Admission: EM | Admit: 2015-11-27 | Discharge: 2015-11-27 | Disposition: A | Discharge status: Home / Self Care | Payer: Medicare PPO | Attending: Emergency Medicine | Admitting: Emergency Medicine

## 2015-11-27 ENCOUNTER — Encounter (HOSPITAL_COMMUNITY): Payer: Self-pay | Admitting: Emergency Medicine

## 2015-11-27 DIAGNOSIS — I456 Pre-excitation syndrome: Secondary | ICD-10-CM | POA: Insufficient documentation

## 2015-11-27 DIAGNOSIS — T7849XA Other allergy, initial encounter: Secondary | ICD-10-CM | POA: Insufficient documentation

## 2015-11-27 DIAGNOSIS — Z91040 Latex allergy status: Secondary | ICD-10-CM | POA: Diagnosis not present

## 2015-11-27 DIAGNOSIS — T7840XA Allergy, unspecified, initial encounter: Secondary | ICD-10-CM

## 2015-11-27 MED ORDER — METHYLPREDNISOLONE 4 MG PO TBPK: ORAL_TABLET | ORAL | 0 refills | Status: AC

## 2015-11-27 MED ORDER — METHYLPREDNISOLONE SODIUM SUCC 125 MG IJ SOLR
125.0000 mg | Freq: Once | INTRAMUSCULAR | Status: AC
  Administered 2015-11-27: 125 mg via INTRAVENOUS
  Filled 2015-11-27: qty 2

## 2015-11-27 MED ORDER — SODIUM CHLORIDE 0.9 % IV BOLUS (SEPSIS)
1000.0000 mL | Freq: Once | INTRAVENOUS | Status: AC
  Administered 2015-11-27: 1000 mL via INTRAVENOUS

## 2015-11-27 NOTE — Discharge Instructions (Signed)
Please read and follow all provided instructions.  Your diagnoses today include:  1. Allergic reaction, initial encounter     Tests performed today include:  Vital signs. See below for your results today.   Medications prescribed:   Medrol dose pack  Take any prescribed medications only as directed.  Home care instructions:   Follow any educational materials contained in this packet  Follow-up instructions: Please follow-up with your primary care provider in the next 3 days for further evaluation of your symptoms.   Return instructions:   Please return to the Emergency Department if you experience worsening symptoms.   Call 9-1-1 immediately if you have an allergic reaction that is worse than your typical reactions and not responding to your standard treatment. Please return if you have any other emergent concerns.  Additional Information:  Your vital signs today were: BP 147/84    Pulse 76    Temp 98.1 F (36.7 C) (Oral)    Resp 22    Ht 5\' 7"  (1.702 m)    Wt 69.9 kg    SpO2 99%    BMI 24.12 kg/m  If your blood pressure (BP) was elevated above 135/85 this visit, please have this repeated by your doctor within one month. --------------

## 2015-11-27 NOTE — ED Triage Notes (Signed)
Nurse first Marisa CyphersBrenda M, RN st's pt talking on phone, no resp. Distress present.

## 2015-11-27 NOTE — ED Triage Notes (Addendum)
Pt to ED with multi. Complaiints.  St's she has been having anaphylatic reactions every 15 mins since last pm.  St's she is allergic to everything even stress.  Pt very talkative in triage.  Speaking in full sentences.  No resp distress noted at this time.  Pt was seen here for same on 11/7.  Pt st's she needs a neg. Pressure room and needs to be shocked.  Pt st's she needs on a Benadryl drip.

## 2015-11-27 NOTE — ED Provider Notes (Signed)
[facility] Provider Note   CSN: [id number] Arrival date & time: 11/27/15  1534     History   Chief Complaint Chief Complaint  Patient presents with  . Allergic Reaction    HPI Joanna GuiseLynette Rojas is a 55 y.o. female.  Patient with history of mast cell activation requiring IV medications and highly specialized care -- presents with recurrent symptoms she states can be triggered by anything. Currently symptoms seem to be exacerbated by air freshener that was sprayed on the floor of the hotel she is staying in. She describes abdominal distention, lip swelling, near syncope, rash that occurs with these episodes. She was seen in emergency department 2 days ago and treated with albuterol and Solu-Medrol. Symptoms seemed to improve. Patient states that she has taken Benadryl, Zantac today which eventually helped calm her symptoms. Symptoms are currently resolved. No shortness of breath or vomiting. She is afraid to be alone and has been calling nursing homes to find a place to stay where she will be safe. She has an allergist appointment scheduled on 11/27 and [facility]. The onset of this condition was acute. The course is constant. Aggravating factors: none. Alleviating factors: none.        Past Medical History:  Diagnosis Date  . Anxiety   . CVID (common variable immunodeficiency) (HCC)   . Heart block AV second degree   . Mast cell activation (HCC)   . MRSA (methicillin resistant staph aureus) culture positive   . Thyroid disease   . 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   History reviewed. No pertinent surgical history.  OB History    No data available       Home Medications    Prior to Admission medications   Medication Sig Start Date End Date Taking? Authorizing Provider  amoxicillin (AMOXIL) 875 MG tablet Take 875 mg by mouth 2 (two) times daily. 11/17/15 11/27/15   Historical Provider, MD  busPIRone (BUSPAR) 15 MG tablet Take 15 mg by mouth 3 (three) times daily.    Historical Provider, MD  cetirizine (ZYRTEC) 10 MG tablet Take 20 mg by mouth every 8 (eight) hours.    Historical Provider, MD  clonazePAM (KLONOPIN) 1 MG tablet Take 1 mg by mouth 3 (three) times daily as needed for anxiety.    Historical Provider, MD  cromolyn (GASTROCROM) 100 MG/5ML solution Take 100 mg by mouth 4 (four) times daily -  before meals and at bedtime.    Historical Provider, MD  diphenhydrAMINE (BENADRYL) 50 MG/ML injection Inject 50 mg into the muscle See admin instructions. EVERY 4-6 HOURS AS NEEDED    Historical Provider, MD  ezetimibe (ZETIA) 10 MG tablet Take 10 mg by mouth every morning.    Historical Provider, MD  fexofenadine (ALLEGRA) 180 MG tablet Take 180 mg by mouth at bedtime.    Historical Provider, MD  furosemide (LASIX) 20 MG tablet Take 20-40 mg by mouth every morning. DEPENDING ON SWELLING    Historical Provider, MD  immune globulin, human, (GAMMAGARD S/D) 5 g injection Inject 6 g into the vein every 7 (seven) days.    Historical Provider, MD  levalbuterol Pauline Aus(XOPENEX) 1.25 MG/3ML nebulizer solution Take 1.25 mg by nebulization every 6 (six) hours as needed for wheezing or shortness of breath.    Historical Provider, MD  levothyroxine (SYNTHROID, LEVOTHROID) 112 MCG tablet Take 112 mcg by mouth daily before breakfast.  Historical Provider, MD  methylPREDNISolone (MEDROL DOSEPAK) 4 MG TBPK tablet 6,5,4,3,2,1 tablets daily. 11/25/15   Preston Fleeting, MD  montelukast (SINGULAIR) 10 MG tablet Take 10 mg by mouth at bedtime.    Historical Provider, MD  omeprazole (PRILOSEC) 40 MG capsule Take 40 mg by mouth 2 (two) times daily.    Historical Provider, MD  OnabotulinumtoxinA (BOTOX IM) Inject 400 Units into the muscle every 3 (three) months.    Historical Provider, MD  ranitidine (ZANTAC) 300 MG tablet Take 300 mg by mouth 3 (three) times daily.    Historical Provider, MD      Family History No family history on file.  Social History Social History  Substance Use Topics  . Smoking status: Never Smoker  . Smokeless tobacco: Never Used  . Alcohol use No     Allergies   Alcohol-sulfur [sulfur]; Asa [aspirin]; Epinephrine; Latex; Lidocaine; Lyrica [pregabalin]; Onion; Other; Red dye; Yellow dye; and Tape   Review of Systems Review of Systems  Constitutional: Negative for fever.  HENT: Positive for facial swelling. Negative for trouble swallowing.   Eyes: Negative for redness.  Respiratory: Positive for wheezing. Negative for shortness of breath and stridor.   Cardiovascular: Negative for chest pain.  Gastrointestinal: Negative for nausea and vomiting.  Musculoskeletal: Negative for myalgias.  Skin: Positive for rash.  Neurological: Positive for light-headedness.  Psychiatric/Behavioral: Negative for confusion.     Physical Exam Updated Vital Signs BP 125/86 (BP Location: Left Arm)   Pulse 96   Temp 98.1 F (36.7 C) (Oral)   Resp 22   Ht 5\' 7"  (1.702 m)   Wt 69.9 kg   SpO2 97%   BMI 24.12 kg/m   Physical Exam  Constitutional: She appears well-developed and well-nourished.  HENT:  Head: Normocephalic and atraumatic.  Mouth/Throat: Oropharynx is clear and moist.  No angioedema.  Eyes: Conjunctivae are normal. Right eye exhibits no discharge. Left eye exhibits no discharge.  Neck: Normal range of motion. Neck supple.  Cardiovascular: Normal rate, regular rhythm and normal heart sounds.   No murmur heard. Pulmonary/Chest: Effort normal and breath sounds normal. No respiratory distress. She has no wheezes. She has no rales.  Abdominal: Soft. There is no tenderness.  Neurological: She is alert.  Skin: Skin is warm and dry. No rash noted.  Psychiatric: She has a normal mood and affect.  Nursing note and vitals reviewed.    ED Treatments / Results  Labs (all labs ordered are listed, but only abnormal results are displayed) Labs  Reviewed - No data to display  EKG  EKG Interpretation  Date/Time:  Thursday November 27 2015 15:48:25 EST Ventricular Rate:  98 PR Interval:  114 QRS Duration: 80 QT Interval:  358 QTC Calculation: 457 R Axis:   85 Text Interpretation:  Sinus rhythm with Premature supraventricular complexes Nonspecific ST and T wave abnormality Abnormal ECG Since last tracing, non-specific ST depressions in inferior leads, previously noted but new since 11/18/15 Confirmed by ISAACS MD, Sheria Lang [phone]) on 11/27/2015 3:54:19 PM       Radiology No results found.  Procedures Procedures (including critical care time)  Medications Ordered in ED Medications  sodium chloride 0.9 % bolus 1,000 mL (not administered)  methylPREDNISolone sodium succinate (SOLU-MEDROL) 125 mg/2 mL injection 125 mg (not administered)     Initial Impression / Assessment and Plan / ED Course  I have reviewed the triage vital signs and the nursing notes.  Pertinent labs & imaging results that were available during my  care of the patient were reviewed by me and considered in my medical decision making (see chart for details).  Clinical Course    Patient seen and examined. She is very animated and anxious, in no extremis. No findings of anaphylaxis at this time. She has not had severe symptoms during 4+ hrs in waiting room. I don't see any indications for admission at this time. I will give IV bolus, IV solumedrol, check orthostatics and monitor. Hopefully this will help ameliorate her symptoms. I anticipate that she will be able to be discharged.   Vital signs reviewed and are as follows: BP 125/86 (BP Location: Left Arm)   Pulse 96   Temp 98.1 F (36.7 C) (Oral)   Resp 22   Ht 5\' 7"  (1.702 m)   Wt 69.9 kg   SpO2 97%   BMI 24.12 kg/m   10:46 PM She is stable. Will d/c to home with Medrol dose pack.   Encouraged return with worsening symptoms or other concerns.   Encouraged follow-up with allergist as planned.    Final Clinical Impressions(s) / ED Diagnoses   Final diagnoses:  Allergic reaction, initial encounter   Patient with history of mast cell disorder -- with recurrent effects of this. She requires highly specialized care by someone who is fully informed of her past history. Patient seems stable tonight, no unstable vitals. She is very frustrated with her chronic illness.   No dangerous or life-threatening conditions acutely suspected or identified by history, physical exam, and by work-up. No indications for hospitalization identified.     New Prescriptions Current Discharge Medication List       Renne CriglerJoshua Synia Douglass, PA-C 11/27/15 2250    Shaune Pollackameron Isaacs, MD 11/29/15 0157

## 2015-12-01 ENCOUNTER — Ambulatory Visit: Payer: Self-pay | Admitting: Pediatrics

## 2015-12-16 ENCOUNTER — Ambulatory Visit: Payer: Self-pay | Admitting: Allergy and Immunology

## 2016-03-30 ENCOUNTER — Ambulatory Visit: Admit: 2016-03-30 | Payer: MEDICARE

## 2016-03-30 ENCOUNTER — Other Ambulatory Visit: Admit: 2016-03-30 | Payer: MEDICARE

## 2016-03-30 DIAGNOSIS — I456 Pre-excitation syndrome: Secondary | ICD-10-CM

## 2016-03-30 DIAGNOSIS — D8940 Mast cell activation, unspecified: Secondary | ICD-10-CM

## 2016-03-30 LAB — TRYPTASE: Tryptase: 5.5 ug/L (ref 2.2–13.2)

## 2016-03-30 LAB — DIFFERENTIAL
Basophils Absolute: 16 /uL (ref 0–200)
Basophils Relative: 0.2 % (ref 0.0–1.0)
Eosinophils Absolute: 86 /uL (ref 15–500)
Eosinophils Relative: 1.1 % (ref 0.0–8.0)
Lymphocytes Absolute: 2207 /uL (ref 850–3900)
Lymphocytes Relative: 28.3 % (ref 15.0–45.0)
Monocytes Absolute: 827 /uL (ref 200–950)
Monocytes Relative: 10.6 % (ref 0.0–12.0)
Neutrophils Absolute: 4664 /uL (ref 1500–7800)
Neutrophils Relative: 59.8 % (ref 40.0–80.0)
nRBC: 0 /100 WBC (ref 0–0)

## 2016-03-30 LAB — CBC
Hematocrit: 41.6 % (ref 35.0–45.0)
Hemoglobin: 13.4 g/dL (ref 11.7–15.5)
MCH: 28.3 pg (ref 27.0–33.0)
MCHC: 32.1 g/dL (ref 32.0–36.0)
MCV: 88.1 fL (ref 80.0–100.0)
MPV: 9.4 fL (ref 7.5–11.5)
Platelets: 279 10*3/uL (ref 140–400)
RBC: 4.72 10*6/uL (ref 3.80–5.10)
RDW: 13.9 % (ref 11.0–15.0)
WBC: 7.8 10*3/uL (ref 3.8–10.8)

## 2016-03-30 NOTE — Unmapped (Signed)
Patient brought in her medical records from [facility] in [place]. Phone number [phone].     Medical records available in Epic.    Prior to that patient was at [employer].

## 2016-03-30 NOTE — Unmapped (Addendum)
It was a pleasure seeing you today!     Make sure to activate your [employer] account if you have not already done so to follow up with your lab results.   Go to the following website: [email or web address]  Click on Activate your account  Enter the following code:   [email or web address]  Expires: 05/29/2016 10:19 AM   Pick a username and password that you can remember, but is difficult for others to guess.  Make sure to write your username and password in a secure location.    Do not share your username and password with anyone to keep your medical records secure.  pt

## 2016-03-30 NOTE — Unmapped (Signed)
[facility] Internal Medicine  Faculty Practice New Patient Visit  Subjective:   Pre-visit planning done, including pre-visit huddle with nursing and MA staff prior to session.     Patient ID: Emily Bonilla is a 56 y.o. lady with a history of paroxysmal atrial fibrillation, mast cell disorder, anxiety, and wolff-parkinson-white syndrome, who presents today for a new patient visit to establish care and for a wellness exam.     HPI   Patient has numerous issues today.  I need help.     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     Recent blood work in [employer] says she has passed out, fell to the floor, 5 times within the past 4 months.  The last time she passed out was in November 2017.  It's when I go to the grocery store and I'm in the produce section and it just hits me.  I go to the ground.  Sometimes I get no warning and just go to the ground.  Sometimes I see little spots.  At times has associated memory loss, has also seized during these episodes, and has been out for 2 hours, I'm not epileptic.     Numerous allergies:  Patient has been using IV and IM benadryl several times daily for her allergies.  Has been putting in in through her port.     Atrial fibrillation with history of syncope, WPW, reportedly s/p ablations x2.  Syncopal episodes reportedly triggered by certain smells, eating, fatigue. Has had broken limbs and teeth as a result.  She states she isn't sure if she is confused afterwards.  She states she has had EEG's in the past, was told this isn't epilepsy.    - TTE 07/01/15 showed an EF of 65%, trace MR, Grade II DD.     Last episode of a fib was 6 weeks ago, says she can feel when it comes on and that it woke her out of her sleep.      I also had v tach.  It was because I am so allergic to so many chemicals, preservatives, and dyes.     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 She states that she was first diagnosed with WPW syndrome at the age of 73 by Dr. [place] in [place]. This was over 25 years ago. He did attempt an ablation at that time but she states it was unsuccessful due to being too close to the SA node. She was without problems for some time but 9 years ago due to a history of paroxysmal atrial fibrillation and frequent episodes with possible preexcitation with a diagnosis of WPW, she was seen at [facility] [place]. She had an EP study at that point in time however no ablation was performed due to the noninducibility of any arrhythmias. Despite the diagnosis of paroxysmal atrial fibrillation with a CHA2DS2VASC score of 1, she is not on any antiarrhythmic therapy and also not on any anticoagulation. Her biggest  complaint today is that she does continue to have frequent falls with the most recent occurring on 11/24/2015. These are commonly preceded by some kind of noxious stimuli whether that be a smell, stress, or the general environment. She has profound allergic reactions to multiple sources including the plastic use on face mask. Due to her complicated history she presents to establish care here as she is considering moving up to [place] from [place], [place].    Hypercholesterolemia: I'm allergic to statins.  My cholesterol is through the roof.2    Pt states she does have anxiety and episodes of depression, but I don't have any psych disorders.  Sees a psychologist who also writes meds for her I vent.  Also states that psychiatry won't see her.  Tries mindful meditation  to calm herself, also has taken klonopin in the past.     Pt moved around frequently because she was married to an Tourist information centre manager.  Was married for 18 years.     Preventative Health Care   There is no immunization history on file for this patient.   - PAP: partial hysterectomy, still has ovaries.  I did it because I bled for two years because of the inflammation.   - Sex: no   - Mammogram: I'm due.  Was due in November.   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    Review of Systems   Constitutional: Negative for chills and fever.   Respiratory: Negative for cough, chest tightness, shortness of breath and wheezing.    Cardiovascular: Negative for chest pain, palpitations and leg swelling.   Neurological: Positive for syncope. Negative for dizziness, weakness and light-headedness.   Psychiatric/Behavioral: Positive for depression. The patient is nervous/anxious.    All other systems reviewed and are negative.  As above, otherwise negative   Objective:   Physical Exam   Nursing note and vitals reviewed.  Constitutional: She is oriented to person, place, and time. She appears well-developed and well-nourished. No distress.   Pleasant lady, sitting in chair, in NAD   HENT:   Head: Normocephalic and atraumatic.   Nose: Nose normal.   Mouth/Throat: Oropharynx is clear and moist. No oropharyngeal exudate.   Eyes: Conjunctivae are normal. Pupils are equal, round, and reactive to light. Right eye exhibits no discharge. Left eye exhibits no discharge. No scleral icterus.   Neck: Normal  range of motion. Neck supple. No tracheal deviation present. No thyromegaly present.   Cardiovascular: Normal rate, regular rhythm and normal heart sounds.  Exam reveals no gallop and no friction rub.    No murmur heard.  Pulmonary/Chest: Effort normal and breath sounds normal. No respiratory distress. She has no wheezes. She has no rales. She exhibits no tenderness.   Abdominal: Soft. Bowel sounds are normal. She exhibits no distension and no mass. There is no tenderness. There is no rebound and no guarding.   Musculoskeletal: Normal range of motion.   Lymphadenopathy:     She has no cervical adenopathy.   Neurological: She  is alert and oriented to person, place, and time.   Skin: Skin is warm and dry. Rash noted. She is not diaphoretic.         Assessment:   Please see comments below.   Plan:   New patient/annual wellness/preventative exam without gynecologic exam  - Screening labs based on risk factors: tryptase, differential, CBC  - immunizations reviewed, none today  - safety and functional status assessed  - preventative measures discussed.    - PAP/STI screening, as above    - Colonoscopy - discussed    - Mammogram - discussed     Anxiety and depression: discussed treatment options with patient. Given numerous medication allergies will refer to psychiatry   - psychiatry referral     Atrial fibrillation, paroxysmal with syncope and WPW: sinus rhythm today.  Again, h/o allergy to several different blood thinners.    - will refer to cardiac electrophysiology     Mast Cell activation syndrome: unclear diagnosis.   - will refer to allery and hem/onc.   - check tryptase level    Multiple allergies: stable  - will refer to allergy    Chronic pain: Assessed as new problem.     - home health referral for PT and HHN    Hashimoto's: stable  - will check TFT's    Patient understands his/her medication(s). Proper use, potential side effects of these medications,  as well as barriers to using these medications were  reviewed and addressed, as were over-the-counter medications and supplements during encounter. Response and side effects to current medications also reviewed.      This note was scribed by me, Brittani Nicolaci, in the presence of Court Joy, MD.     **I spent greater than 60 minutes of direct face to face time with patient, including time spent evaluating, examining and discussing her current disease processes and plan of treatment. Greater than half of the above time was spent counseling patient on current chief complaint and disease processes.  We reviewed her outside records on [facility].     This note was scribed in the presence of me, Court Joy, MD, by Gregor Hams Nicolaci.  The documentation recorded by the scribe accurately reflects the service I personally performed and the decisions I made.  This note was reviewed by me, Court Joy, MD, for accuracy, though proofreading may not be 100% accurate.  Feel free to contact me with any questions or concerns.     Court Joy, MD, FACP  Assistant Professor of Clinical Medicine  [facility]  Department of Internal Medicine  03/30/2016   4:15 PM

## 2016-03-31 MED ORDER — ranitidine (ZANTAC) 300 MG tablet
300 | ORAL_TABLET | Freq: Three times a day (TID) | ORAL | 1 refills | Status: AC
Start: 2016-03-31 — End: 2016-06-08

## 2016-03-31 MED ORDER — montelukast (SINGULAIR) 10 mg tablet
10 | ORAL_TABLET | Freq: Every evening | ORAL | 1 refills | Status: AC
Start: 2016-03-31 — End: 2016-09-11

## 2016-03-31 MED ORDER — furosemide (LASIX) 20 MG tablet
20 | ORAL_TABLET | Freq: Every day | ORAL | 1 refills | Status: AC
Start: 2016-03-31 — End: 2016-09-11

## 2016-03-31 NOTE — Unmapped (Signed)
Requested Prescriptions     Pending Prescriptions Disp Refills   ??? cromolyn (GASTROCROM) 100 mg/5 mL solution 480 mL 1     Sig: Take 10 mLs (200 mg total) by mouth 4 times daily before meals and at bedtime.   ??? montelukast (SINGULAIR) 10 mg tablet 90 tablet 1     Sig: Take 1 tablet (10 mg total) by mouth at bedtime.   ??? ranitidine (ZANTAC) 300 MG tablet 90 tablet 1     Sig: Take 1 tablet (300 mg total) by mouth 3 times a day.   ??? furosemide (LASIX) 20 MG tablet 180 tablet 1     Sig: Take 1 tablet (20 mg total) by mouth daily.

## 2016-03-31 NOTE — Telephone Encounter (Signed)
SW received call forwarded from somewhere in the hospital. Pt looking for transportation resources to get to and from appointments. Pt being seen by Dr. Lourdes Sledge for a Mast Cell dx, receives chemo and sees a Hematologist, so SW provided pt with contact information for Sonnie Alamo from [employer] (ACS) [phone], encouraged her to call about transportation. SW also provided emotional support as pt shared her struggles coping emotionally with all of the medical issues and barriers it provides to her life. Pt has referral out for home health, is considering Assisted Living if she feels the home health is not enough care as she is very overwhelmed. Babette, SW with [facility] will call her this afternoon for follow up re: the referrals and any other needs. Pt was also given contact information for Judi, SW with Dr. Lourdes Sledge and this SW contact so we can hopefully provide some assistance to pt needs.   Jillyn Hidden, MSW, LISW-S  [phone]

## 2016-03-31 NOTE — Telephone Encounter (Signed)
Patient requesting process for [facility] referral.  Educated patient on HHC process and that referral will be sent once office visit not completed.  Patient with no preferred HHC.  Ms. Chadwick in agreement to referral being sent to next vendor. [facility] [phone].  Reviewed with patient services offered at [facility] [phone].  Patient in agreement.  Contact information provided.  No further SW need(s) reported or identified this encounter.  SW contact information provided should further need(s) arise.  Wished patient well.

## 2016-04-01 NOTE — Telephone Encounter (Signed)
AMB referral to home health faxed to [facility] [phone].  Patient accepted.

## 2016-04-02 NOTE — Unmapped (Signed)
Addended by: Court Joy on: 04/02/2016 12:08 PM     Modules accepted: Orders

## 2016-04-06 NOTE — Telephone Encounter (Signed)
Nurse calling-Alecia    From [facility]  Phone [phone]    Calling to ask to speak with MD directly     Future Appointments  Date Time Provider Department Center   04/21/2016 2:00 PM [facility] BIC SCR MAM 1 [facility] Imaging   04/21/2016 3:00 PM Deeptankar Demazumder, MD [facility] HOX HOX   05/13/2016 9:15 AM Court Joy, MD [facility] HOX HOX   05/17/2016 9:50 AM Loleta Books, MD [facility]   06/24/2016 1:15 PM Kalman Drape, MD [facility] PSY OP OP   07/15/2016 8:40 AM Parks Ranger, MD [facility] ALG HOX HOX     Forwarding to MD for review and further instructions or advice.

## 2016-04-06 NOTE — Telephone Encounter (Signed)
Called home health nurse #, spoke to [place].    Numerous different complaints -   - patient has follow up with allergy, cardiology and psychiatry, but allergy is not until June and she states she needs to be seen sooner.    - HHN was concerned that she is on both Zantac 300 mg TID as well as omeprazole.  I was unable to find records mentioning the need for this.   - patient asking about Botox injections.    - patient also asking about a nebulizer.      I discussed patient with RN - I did not see a lot of information from her records that we had access to.  I will look further. I will also get more information and try to get patient sooner appointments with her specialists.  She verbalized understanding and expressed appreciation for the call.

## 2016-04-09 NOTE — Telephone Encounter (Signed)
Patient called  Phone [phone] (home)     Asking for new allergy appointment - NEW PATIENT   My search took me to end of year.     Please advise

## 2016-04-09 NOTE — Unmapped (Signed)
Called and spoke with pt     Sooner appointment has been made     Future Appointments  Date Time Provider Department Center   04/21/2016 2:00 PM [facility] BIC SCR MAM 1 [facility] MAMM [facility] Imaging   04/21/2016 3:00 PM Deeptankar Demazumder, MD [facility] HOX HOX   05/13/2016 9:15 AM Court Joy, MD [facility] HOX HOX   05/17/2016 9:50 AM Loleta Books, MD [facility]   06/03/2016 9:20 AM Margo [place], DO [facility] ALG HOX HOX   06/24/2016 1:15 PM Kalman Drape, MD [facility] PSY OP OP

## 2016-04-14 NOTE — Unmapped (Signed)
Received paperwork from [facility] regarding PT plan of care, dated 04/14/16. Paperwork will be put in the MD's mailbox today for completion and signing Tera Mater 04/14/2016 3:24 PM

## 2016-04-14 NOTE — Unmapped (Signed)
Completed paperwork and faxed to number given Emily Bonilla 04/15/2016 9:01 AM

## 2016-04-20 MED ORDER — cromolyn (GASTROCROM) 100 mg/5 mL solution
100 | Freq: Four times a day (QID) | ORAL | 0 refills | 24.00000 days | Status: AC
Start: 2016-04-20 — End: 2016-05-24

## 2016-04-20 NOTE — Unmapped (Signed)
Per Dr. Alpha Gula pt needs to be seen sooner than May for allergy. Please see note from 3/13 from Dr. Alpha Gula and advise. Thank you, Doreene Adas 04/20/2016 11:28 AM

## 2016-04-20 NOTE — Telephone Encounter (Addendum)
HHC requesting sooner Allergy Visit for patient.  Currently scheduled for 06/03/16 with [facility] MD [place].  Patient is in need of new RX for Cromolyn to hold her over until she is able to establish care with mentioned allergy clinic.  Patient would like medical home to be UC and is not wanting to leave UC system to see other allergist.  SW spoke with [facility] Allergy clinic staff Marisue Ivan who is requesting MD Chrystie Nose review PCP office visit note to determine if patient can be seen sooner.

## 2016-04-20 NOTE — Unmapped (Signed)
Will forward message to fellows for permission to OB     Pt has been given the soonest appointment available at this time and has been added to the wait list as high priority     Thank you

## 2016-04-20 NOTE — Unmapped (Signed)
Addended by: Court Joy on: 04/20/2016 05:24 PM     Modules accepted: Orders

## 2016-04-21 ENCOUNTER — Ambulatory Visit: Admit: 2016-04-21 | Payer: MEDICARE | Attending: Clinical Cardiac Electrophysiology

## 2016-04-21 ENCOUNTER — Inpatient Hospital Stay: Admit: 2016-04-21 | Payer: MEDICARE

## 2016-04-21 DIAGNOSIS — I4891 Unspecified atrial fibrillation: Secondary | ICD-10-CM

## 2016-04-21 DIAGNOSIS — Z1231 Encounter for screening mammogram for malignant neoplasm of breast: Secondary | ICD-10-CM

## 2016-04-21 NOTE — Unmapped (Signed)
HISTORY OF PRESENT ILLNESS:  Emily Bonilla is a pleasant 56 year-old woman with a complex medical history, including psychiatric, allergic, mast cell, and thyroid issues, and a remote history of WPW and an episode of atrial fibrillation, who presents to the [facility] Cardiac Electrophysiology clinic who presents to the [facility] Cardiac Electrophysiology clinic and is being seen by me for the first time for assessment and management.    The patient brought a stack of papers with detailed history. This included the documented episode of atrial fibrillation which upon review, seem to not be atrial fibrillation and instead, is likely to be a sinus tachycardia. Further, review of her ECG suggests little or no anterograde conduction of the putative WPW accessory pathway. Unfortunately, no records of the prior EP studies were available at the time of this visit and my office will attempt to obtain these for further insight.    Currently, the patient has no other complaints and is comfortable. The patient does not have nausea, vomiting, fevers, chills, chest pain, dyspnea with exertion, diaphoresis, orthopnea, paroxysmal nocturnal dyspnea, dizziness, presyncope, palpitations, fatigue, exercise intolerance, claudication, visual changes, neurological complaints, urinary symptoms, bowel changes, or edema.    PAST MEDICAL/SURGICAL HISTORY:  As listed above in the HPI.    FAMILY HISTORY:  There is no family history of sudden cardiac death.    SOCIAL HISTORY:   The patient does not have a history of tobacco use, significant alcohol use or illicit drug use.      REVIEW OF SYSTEMS:  A 12-point review of systems was conducted and was negative except for the pertinent positives and negatives listed above in the HPI.    PHYSICAL EXAM:  A comprehensive cardiovascular examination was performed.  GEN: Comfortable, anxious, NAD.   SKIN: No cyanosis or pallor.  LUNGS: Symmetric chest, clear to auscultation bilaterally.  HEART: No JVD, regular  rhythm, normal S1 and S2, no murmurs, rubs or gallops.   ABD: Soft, non-tender, non-distended, bowel sounds present.   EXT: No LE edema, dorsalis Pedis and radial pulses are full and equal bilaterally.   NEURO: Alert & oriented x 3, CN II-XII intact.    ECG: Normal sinus rhythm, 67 bpm, no evidence of accessory pathway conduction at this rate.    TEST RESULTS:  Prior test results and chart notes were reviewed in detail, including labs, radiology and ECGs, and pertinent positives and negatives were noted.    ASSESSMENT:  Emily Bonilla is a pleasant 56 year-old woman with a complex medical history, including psychiatric, allergic, mast cell, and thyroid issues, and a remote history of WPW and an episode of atrial fibrillation, who presents to the [facility] Cardiac Electrophysiology clinic who presents to the [facility] Cardiac Electrophysiology clinic and is being seen by me for the first time for assessment and management.    The patient brought a stack of papers with detailed history. This included the documented episode of atrial fibrillation which upon review, seem to not be atrial fibrillation and instead, is likely to be a sinus tachycardia.     The lack of any signs of the accessory pathway on review of her ECG at 67 bpm suggests little or no anterograde conduction of the putative WPW accessory pathway. Unfortunately, no records of the prior EP studies were available at the time of this visit and my office will attempt to obtain these for further insight. It is reasonable to avoid AV nodal blocking agents in this patient.    RECOMMENDATIONS:    1.  There is no evidence available at the time of this clinic visit that the patient had any episode of atrial fibrillation. If she did have an episode of atrial fibrillation during stress, and this episode has not recurred, no further treatment is necessary.    2. There is no indication for additional EP workup or treatment for WPW at this time. A concealed accessory pathway is  possible. Recommend avoiding AV nodal blocking agents until documentation is available from the prior EP studies.    3. Agree with referral to psych and allergy and other specialists.    4. Centralize care through primary care physician and psychiatry.    Health Maintenance  -- The patient was advised to contact the Cardiac Electrophysiology team at the [facility] in case of worsening symptoms, including fever, chills, nausea, vomiting, persistent cough, diarrhea, decreased or increased urinary output, shortness of breath, chest pain, palpitations, headache, dizziness, weakness, loss of balance, persistent pain, visual changes, bleeding, or for any symptoms that are of concern to the patient.   -- For matters after hours or on weekends, the patient was advised to contact the senior Cardiology Fellow on Call.  -- If prompt contact cannot be established with a physician, or in case of an emergency, the patient should call 911 and go to the nearest emergency room (Crisis hotline is [phone] nationally).    DICTATED BY:  Hershal Coria, MD, PhD  Assistant Professor of Medicine (Cardiac Electrophysiology)  [facility] Department of Medicine  [address] [place], [address]), [place], [place] [address], [place]  Bethany Hudson, Environmental health practitioner  [phone] (F), [email or web address]

## 2016-04-21 NOTE — Unmapped (Addendum)
Follow up in three months.    There are no changes in your medications at this time.    Please call with any questions or concerns at [phone].

## 2016-04-21 NOTE — Unmapped (Signed)
Patient noftified that refill of cromolyn was sent to [facility]. Informed that allergy staff will check on possibility of moving up her new patient appointment.

## 2016-04-21 NOTE — Telephone Encounter (Addendum)
HHC informed of Rx for Cromolyn sent to patient's pharmacy.

## 2016-04-22 NOTE — Unmapped (Signed)
Patient called  Phone [phone] (home)     Pharmacy phone / location     Asking if you can see her on 05/17/2016.  She has Hemoc 9:50  At [facility].  nevermind , I found another slot for her on a later date with ALLERGY.     She gets wiped out from the [place] trip in and back out.  So I closed your 11 am on the day I scheduled her and took your acute to do it.      Future Appointments  Date Time Provider Department Center   05/17/2016 9:50 AM Loleta Books, MD [facility]   06/03/2016 9:20 AM Metta Clines, DO [facility] ALG HOX HOX   06/03/2016 10:30 AM Court Joy, MD [facility] FAC HOX HOX   06/24/2016 1:15 PM Kalman Drape, MD [facility] PSY OP OP   07/28/2016 9:30 AM Hershal Coria, MD [facility] HOX HOX

## 2016-04-27 ENCOUNTER — Inpatient Hospital Stay: Admit: 2016-04-27 | Payer: MEDICARE

## 2016-05-07 NOTE — Unmapped (Signed)
Faxed  to number given. Confirmation received, copy filed. LISA STARKEY5/2/20189:51 AM

## 2016-05-07 NOTE — Unmapped (Signed)
Received paperwork from [facility]  regarding PT POC, dated 2061/03/01. Paperwork will be put in the MD's mailbox today for completion and signing Tera Mater 05/07/2016 12:29 PM

## 2016-05-13 ENCOUNTER — Ambulatory Visit: Admit: 2016-05-13 | Payer: MEDICARE

## 2016-05-13 ENCOUNTER — Other Ambulatory Visit: Admit: 2016-05-13 | Payer: MEDICARE

## 2016-05-13 DIAGNOSIS — D4709 Other mast cell neoplasms of uncertain behavior: Secondary | ICD-10-CM

## 2016-05-13 LAB — CBC
Hematocrit: 43.3 % (ref 35.0–45.0)
Hemoglobin: 14.2 g/dL (ref 11.7–15.5)
MCH: 28.7 pg (ref 27.0–33.0)
MCHC: 32.8 g/dL (ref 32.0–36.0)
MCV: 87.6 fL (ref 80.0–100.0)
MPV: 8.4 fL (ref 7.5–11.5)
Platelets: 300 10E3/uL (ref 140–400)
RBC: 4.94 10E6/uL (ref 3.80–5.10)
RDW: 14.2 % (ref 11.0–15.0)
WBC: 7.1 10E3/uL (ref 3.8–10.8)

## 2016-05-13 LAB — DIFFERENTIAL
Basophils Absolute: 36 /uL (ref 0–200)
Basophils Relative: 0.5 % (ref 0.0–1.0)
Eosinophils Absolute: 71 /uL (ref 15–500)
Eosinophils Relative: 1 % (ref 0.0–8.0)
Lymphocytes Absolute: 1910 /uL (ref 850–3900)
Lymphocytes Relative: 26.9 % (ref 15.0–45.0)
Monocytes Absolute: 873 /uL (ref 200–950)
Monocytes Relative: 12.3 % (ref 0.0–12.0)
Neutrophils Absolute: 4210 /uL (ref 1500–7800)
Neutrophils Relative: 59.3 % (ref 40.0–80.0)
nRBC: 0 /100 WBC (ref 0–0)

## 2016-05-13 LAB — KAPPA/LAMBDA, FREE LIGHT CHAINS, SERUM
Kappa/Lambda Ratio: 0.29 ratio (ref 0.26–1.65)
Kappa: 2.4 mg/L (ref 3.3–19.4)
Lambda: 8.4 mg/L (ref 5.7–26.3)

## 2016-05-13 LAB — IGG: IgG: 800 mg/dL (ref 751.0–1560.0)

## 2016-05-13 LAB — TRYPTASE: Tryptase: 6 ug/L (ref 2.2–13.2)

## 2016-05-13 LAB — IGA: IgA: 119 mg/dL (ref 82.0–453.0)

## 2016-05-13 LAB — IGM: IgM: 56.1 mg/dL (ref 46.0–304.0)

## 2016-05-13 NOTE — Unmapped (Signed)
[facility] /F2F documentation paperwork recvd and placed on md's desk for completion. LISA [facility] 05/13/2016 1:59 PM

## 2016-05-13 NOTE — Unmapped (Signed)
Allergy/Immunology New Clinic Visit     Cc:   Chief Complaint   Patient presents with   ??? New Patient Visit/ Consultation     H/o mastocytosis       Reason for Consult: evaluation for h/o mastocytosis    HPI: Emily Bonilla is a 56 y.o.female with complicated PMH who presents for evaluation of mastocytosis.  Pt moved here from [place], as she has family in [place], and her diagnosis has been managed by at least 3 different hospitals.  This history below is largely derived from records provided by the pt.  She states that she was diagnosed with mastocytosis in the late 1990s.      [facility] February/March 1998: Pt reported intermittently recurrent sx--severe flushing episodes a/w BP changes, syncope, abdominal bloating and cramping, fatigue/weakness.  Also had recurrent episodes of respiratory distress c/b sudden onset, severe dyspnea, inspiratory and expiratory wheezing, throat and chest tightening.  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  Pt was started on high-dose PO antihistamines and Gastrocrom with decrease in frequency of attacks but no complete resolution.  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  Relaxation exercises + laryngeal massage improved resp distress episodes, and pt adopted strict diet (pork, fresh corn, potato, custards) that decreased abdominal bloating and flushing    Pt had hospital admission on 02/17/1996--p/w respiratory distress and states that her sx needed to be treated with IV benadryl--she was given 50mg  w/improvement in 1hr.  On second day of hospitalization, she had severe leg cramps and was given parenteral benadryl and  Vistaril with relief of sx--no recurrence of resp sx.      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    Allergy eval by [facility]: Physicians explained to pt that she did not have pathologic mastocytosis based on available evidence.  Felt that constellation of diagnoses (food and preservative allergies, asthma, VCD) could masquerade as mastocytosis--recommended no further evaluation for mastocytosis.  Attempted to wean down antihistamine regimen to benadryl, Zyrtec, doxepin.      Dr.Betty Ronne Binning at the [facility] [place] 09/09/1997: Described pt's condition as probable mastocytosis.  Pt was using Gastrocrom 200mg  QID, benadryl 100mg  BID, Zytec 10mg  QHS PRN for episodes, Doxepin 100mg  QHS, Zantac 200mg  BID and occasional Prevacid 30mg  in the morning.    Dr.Bryan Locke (Hematology) 11/05/2014: States that pt had elevated tryptase level on two occasions over five years apart (48 and in the 30s) during allergic rxn exacerbations--SOB, anxiety attacks, distention, nausea, and vomiting.  Pt also reported multiple hospitalizations for anaphylactic rxns to smells, odors, perfumes, various meds, narcotics.  At that time, pt reported giving herself IV benadryl 4-6hrs through a port.  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  Dx pt w/idiopathic mast cell activation syndrome due  to report of prior elevated tryptase (though all normal outside of those two episodes) and clinical history with objective  pictures of rash/flushing. Continued antihistamine w/plan to add cromolyn depending on insurance coverage.      [facility] 07/19/2015:  Admitted for fall but also evaluated by A/I service.  Per specialists--history and extensive w/units thus far is not c/w idiopathic mast cell activation syndrome--tryptase level normal. Had episode of lip swelling that was attributed to MSSA soft tissue infection (evaluated by ID).  Port removed during this time due to erosion.   Told she continue PO H1 and H2 blockers, PPI, Singulair 10mg  QHS, and benadryl 40mg  Q4H PRN for sx control.  Plan had been to check IgG and tryptase level 20mo after completion of steroids.      ACT score: ACT  How much time did your asthma keep you from getting as much done at work, school, or at home?: some of the time  How often have you had shortness of breath?: more than once a day  How often did your asthma symptoms wake you up at night or earlier than usual in the morning?: 2 or 3 nights a week  How often have you used your rescue inhaler or nebulizer medication?: not at all  How would you rate your asthma control?: well controlled  ACT Total: 15               Review of Systems:    Const.:      [x]  NL  []  Weight change  []  Fever  []  Other:    Skin:        []  NL  [x]  Itching  []  Rash  []  Other:    Head/Eyes:   [x]  NL  []  Headache  []  Change in vision  []  Other:    ENT/Mouth:   [x]  NL  []  Change in hearing  []  Dental problems  []  Other:    Resp.:       []  NL  []  Cough  [x]  Difficulty breathing  []  Other:    CV:          [x]  NL  []  Chest pain  []  Leg swelling  []  Other:    GI:          []  NL  [x]  Nausea/vomiting  [x]  Diarrhea  []  Other:    GU:          [x]  NL  []  Frequent urination  []  Urinary infection  []  Other:    MS:          [x]  NL  []  Muscle weakness  []  Joint pain  []  Other:    Heme/Lymph: [x]  NL  []  Easy bruising  []  Blood transfusion  []  Other:    Endo:        [x]  NL  []  Heat/cold intol.  []  Change in appetite  []  Other:     Neuro:       []  NL  []  Fainting  [x]  Seizures  []  Other:_    Psych:       []  NL  [x]  Anxiety  []  Depression  []  Other:_    All/Rheum:   []  NL  [x]  See ACT/Rhinitis []  Joint stiffness  []  Other:         PMH:  Hypothyroidism  WPW   Anxiety and depression  VCD  Asthma  CVID    PSH:  Past Surgical History:   Procedure Laterality Date   ??? CHOLECYSTECTOMY  Family History:  Mom and aunt--mastocytosis (clinical dx)  Venom allergy       Allergies:  Allergies   Allergen Reactions   ??? Acetaminophen Anaphylaxis     Unknown   ??? Aspirin Anaphylaxis     Unknown   ??? Blue Dye Anaphylaxis     Unknown   ??? Epinephrine Anaphylaxis   ??? Flecainide Shortness Of Breath     Itching, decreased libido, wheezing, flushing, numbness.    ??? Gabapentin Anaphylaxis   ??? Iodinated Contrast- Oral And Iv Dye Anaphylaxis   ??? Isopropyl Alcohol Anaphylaxis   ??? Latex Anaphylaxis and Rash   ??? Lidocaine Anaphylaxis   ??? Nsaids (Non-Steroidal Anti-Inflammatory Drug) Anaphylaxis   ??? Onion Anaphylaxis   ??? 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   ??? Perfume Anaphylaxis   ??? Pregabalin Anaphylaxis   ??? Quetiapine Anaphylaxis     Mental Status Change   ??? Red Dye Anaphylaxis     Unknown   ??? Sulfur Anaphylaxis     Pt states: Any alcohol product   ??? Yellow Dye Anaphylaxis     Unknown   ??? Doxepin Other (See Comments)     Unknown   ??? Duloxetine      Other reaction(s): Other (See Comments)  Mental Status Change   ??? Hydroxyzine Itching     Unknown   ??? Levofloxacin Itching   ??? Verapamil (Bulk) Hives     Insomnia, flushing, scattered brain, migraine, itching, anxiety.    ??? Adhesive Tape-Silicones Itching, Rash and Swelling     Unknown   ??? Trazodone Rash       Social History:  Social History     Social History   ??? Marital status: Divorced     Spouse name: N/A   ??? Number of children: N/A   ??? Years of education: N/A      Occupational History   ??? Not on file.     Social History Main Topics   ??? Smoking status: Never Smoker   ??? Smokeless tobacco: Never Used   ??? Alcohol use No   ??? Drug use: No   ??? Sexual activity: Not on file     Other Topics Concern   ??? Not on file     Social History Narrative   ??? No narrative on file         Medications:  Current Outpatient Prescriptions on File Prior to Visit   Medication Sig Dispense Refill   ??? cetirizine (ZYRTEC) 10 MG tablet Take 60 mg by mouth.               ??? cholecalciferol, vitamin D3, 1000 units tablet Take by mouth.     ??? cromolyn (GASTROCROM) 100 mg/5 mL solution Take 10 mLs (200 mg total) by mouth 4 times daily before meals and at bedtime. 480 mL 0   ??? diphenhydrAMINE (BENADRYL) 50 MG capsule Take by mouth every 4 hours.               ??? EPINEPHrine 1 mg/mL (1 mL) Soln injection Inject into the muscle.     ??? fexofenadine (ALLEGRA) 180 MG tablet Take 180 mg by mouth.     ??? furosemide (LASIX) 20 MG tablet Take 1 tablet (20 mg total) by mouth daily. 180 tablet 1   ??? levothyroxine (SYNTHROID, LEVOTHROID) 112 MCG tablet Take by  mouth.     ??? montelukast (SINGULAIR) 10 mg tablet Take 1 tablet (10 mg total) by mouth at bedtime. 90 tablet 1   ??? omeprazole (PRILOSEC) 40 MG capsule Take 40 mg by mouth.     ??? ranitidine (ZANTAC) 300 MG tablet Take 1 tablet (300 mg total) by mouth 3 times a day. 90 tablet 1   ??? IMMUNE GLOBULIN,GAMMA,IGG, (IMMUNE GLOBULIN, HUMAN,, IGG, IV) Inject 6 g subcutaneously.     ??? iron dextran complex (INFED) 100 mg/2 mL (50 mg/mL) Soln injection Inject into the vein.     ??? levalbuterol (XOPENEX) 1.25 mg/3 mL nebulizer solution 1.25 mg.     ??? onabotulinumtoxinA (BOTOX COSMETIC) 50 unit SolR Inject into the muscle.       No current facility-administered medications on file prior to visit.          Physical Exam:   Vitals:    05/13/16 1018   BP: 132/72   Pulse: 77   Temp: 98.4 ??F (36.9 ??C)     PF Readings from Last 3 Encounters:   No data found for PF               System/Body  Area  /  Normal/Negative     /  Positive Findings    Gen.       Distress     []  None significant     [x]  Present                   Habitus      []  Well Nourished       [x]  Overweight  []  Underweight    Skin        Turgor       [x]  Normal               []  Abnormal:                   Rash         []  None                 [x]  Present:  Red patch on leg  Eyes       Sclera       [x]  Non-injected         []  Injected                                [x]  No watering          []  Watering                    Pupils       [x]  Equal                []  Not equal                                [x]  Reactive             []  Not reactive    ENT/OP  Mucosa       []  Non-edematous       []  Boggy  [x]  Edematous                                                           []   Erythematous                    Discharge   []  None                 []  Watery  [x]  Mucoid                    Septum       [x]  No perforation       []  Perforation                    Polyps       [x]  None                 []  Present                    Oropharynx [x]  No edema, thrush    []  Cobblestoning  []  Thrush    Neck        ROM          [x]  Neck supple          []  Neck not supple                    Thyroid      []  No enlargement       []  Thyroid enlargement    Resp.      Effort       [x]  No resp distress     []  Resp distress                    Ausc.        [x]  Lungs Clear          []  Wheezing  []  Crackles    CV           Rhythm       [x]  Regular rhythm       []  Irregular rhythm                    Sounds       [x]  No M/R/G             []  Murmurs, rubs, gallops                    Edema        []  No edema             []  Edema    GI          Bowel Sound [x]  Normal               []  Hypoactive  []  Hyperactive                     Abd Palp     [x]  No tenderness        []  Tenderness    GU           CVA          []  No tenderness        []  Tenderness                     Bladder      []  Not distended        []  Distended    MS           Joints       []  No tenderness        []   Tenderness                      Muscles    []  No tenderness        []  Tenderness    Lymph      Neck         [x]  No lad    []  Lymphadenopathy                     Axillae      []  No lad    []  Lymphadenopathy    Neuro       Mental Stat [x]  A/O x3              []  Not oriented x3                     Sensation   [x]  Grossly normal       []  Sensation not normal      Labs/Imaging:  Tryptase 5.5 in 03/2016  Surgical pathology report from 08/2013: liver and GB biopsies--negative for malignancy, no increase in mast cells (confirmed by [facility] and CD117 immunohistochemical staining)  See HPI for further discussion of w/u    Assessment/Plan:  1. 55yo CF with complicated PMH presents for evaluation of mastocytosis.      According to the [facility] guidelines, those diagnosed with systemic mastocytosis must fulfill the following criteria (1 major and 1 minor OR 3 minor):  Major criterion??--??The major criterion is the presence of multifocal, dense aggregates of greater than 15 mast cells in bone marrow (preferred) or other extracutaneous organs (eg, gastrointestinal tract, lymph nodes, liver, or spleen), as detected with antibodies for tryptase or other special stains   Minor criteria??--1) Atypical morphology or spindle shapes in >25 percent of the mast cells in bone marrow sections, bone marrow aspirate, or other extracutaneous tissues. 2) Mutational analysis of KIT (the gene for c-kit) showing a codon 816 mutation (commonly Asp816Val) in bone marrow, peripheral blood, or extracutaneous organs. 3) Bone marrow or other extracutaneous mast cells expressing the surface markers CD2, CD25, or both. 4) Serum tryptase levels (when the patient is in a baseline state) >20 ng/mL.     At this time, the pt does not fulfill the criteria for a diagnosis of systemic mastocytosis, and recent evaluations by physicians seem to be in line with this thought process.  However, as the pt seems to have sx that respond to antihistamine use, work-up in relation to this  diagnosis should be pursued.  As pt complained of a potential attack during this visit, a tryptase level was obtained.  Other labwork, including urine studies, will also be pursued, including r/o carcinoid syndrome or pheochromocytoma.  Note that the differential for systemic mastocytosis include idiopathic anaphylaxis or  idiopathic mast cell activation syndrome.  Also, it was explained to pt that we would not be able to prescribe IV or IM benadryl and that treatment would largely focus on PO medications, if warranted.  I also recommended that she seek referral for Psychiatry for management of her anxiety.      The patient was seen and discussed with Dr.Villareal    Return to Clinic 2 months

## 2016-05-13 NOTE — Unmapped (Signed)
Faxed  to number given. Confirmation received, copy filed. LISA STARKEY4/30/20189:16 AM

## 2016-05-13 NOTE — Unmapped (Signed)
I saw and examined the patient 05/13/16.  I discussed with the resident or fellow and agree with Dr Aldean Jewett' findings and plan as documented in the note.    HPI:  56 year old female seen for allergy evaluation.  Pt presents with very complicated history.  She states she has mastocytosis and has been receiving IV and IM Benadryl as needed. Her sxs range from diarrhea, abdominal cramps, anxiety, swelling.  She currently takes PO  Benadryl 300 mg a day.   Reviewed medical records that patient brought with Dr Fayrene Fearing.    Physical Exam: Agree with Dr Fayrene Fearing' finding    A&P:  Agree with plan as discussed  Obtain tryptase, 24 hr 5hiaa, metanephrines, norepinephrine,  Check igg, iga, igm  Return in 2 months  Explained to patient we do not rx anxiety meds.    Flora Lipps, MD

## 2016-05-17 ENCOUNTER — Ambulatory Visit: Payer: MEDICARE

## 2016-05-19 NOTE — Unmapped (Signed)
The request for Medical records that was sent to [facility] was sent to the wrong dept, it went to the Billing Dept    The Pt was contacted about this     She is calling to give correct where the request should sent:     [facility]    Fax # [phone], office # [phone]    Pleas ensure that the return address is for [facility] Allergy Dept    The Fax # of [phone] is the wrong fax # , they shredded the documents that was sent to that fax #.

## 2016-05-19 NOTE — Telephone Encounter (Signed)
Patient called  Phone [phone] (home)     Please fax ROI to   The office of doctor  in [place]   Phone     This was originally faxed to a billing office in [place] , so Dr Metta Clines will not be getting the medical records without the corrections to where the records are to be sent and  Faxing release to correct office  to the correct office.   She will be calling to get correct information to resend ROI but the send to location needs to be changed to Dr Dineen Kid office address.

## 2016-05-21 ENCOUNTER — Ambulatory Visit: Admit: 2016-05-21 | Payer: MEDICARE

## 2016-05-21 DIAGNOSIS — F418 Other specified anxiety disorders: Secondary | ICD-10-CM

## 2016-05-21 MED ORDER — busPIRone (BUSPAR) 5 MG tablet
5 | ORAL_TABLET | Freq: Two times a day (BID) | ORAL | 1 refills | Status: AC
Start: 2016-05-21 — End: 2016-06-24
  Filled 2016-05-21: qty 60, 30d supply, fill #0

## 2016-05-21 NOTE — Unmapped (Signed)
[facility]  Faculty [facility], Follow Up Visit   Subjective:   Pre-visit planning done, including pre-visit huddle with nursing and MA staff prior to session.    Patient ID: Emily Bonilla is a 56 y.o. lady with a history of WPW, Mast Cell activation syndrome, as well as multiple allergies, anxiety and depression, syncope, here for follow up for above.     HPI   Anxiety and depression: has been on klonopin 1 mg daily as needed for anxiety.  psychiatry: pending [facility] appt 06/24/16.  Patient states she had been on klonopin in the past.  I've tried meditation, but it's not working. Patient states she gets worsening anxiety when she eats spicy foods.  Patient states she would like buspar and klonopin refills.      Mast cell activation syndrome: was seen by allergy -  patient states she is getting an overnight urine 5HIAA, metanephrines, norepinephrine.  Tryptase was negative, but they will repeat. Patient states she has been on IV benadryl for several years, and states her reaction has worsened since then.  She states she has noted inflammation in her lips, perianal and vaginal area.      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  She is rescheduled for June.       The following portions of the patient's history were reviewed and updated as appropriate: allergies, current medications, past family history, past medical history, past social history, past surgical history and problem list.    Review of Systems  As above, otherwise negative.   Objective:   Physical Exam   Nursing note and vitals reviewed.  Constitutional: She is oriented to person, place, and time.   Pleasant lady, sitting in chair, talkative, in no acute distress.   Cardiovascular: Normal rate, regular rhythm and normal heart sounds.    Pulmonary/Chest: Effort normal and breath  sounds normal. No respiratory distress. She has no wheezes. She has no rales.   Musculoskeletal: Normal range of motion.   Neurological: She is alert and oriented to person, place, and time.   Skin: Skin is warm and dry.   Psychiatric: She has a normal mood and affect. Her behavior is normal. Judgment and thought content normal.     Assessment:   Please see below   Plan:   Anxiety and depression: continues to remain uncontrolled. Unfortunately [facility] was unable to see her, but she does have a pending appt on June 24, 2016.  We had a long conversation that she would need to follow up with psychiatry. We also discussed that our office would not be able to write for benzodiazepines. Patient states she is interested in hypnotherapy - she understands that [facility] disorder clinic may not be able to provide this.    - will restart buspar 5 mg po BID (until she is seen by psychiatry, after which point they will take over her regimen).   - reiterated relaxation techniques, breathing exercises, as well as exercise, stretches, as well as proper sleep hygiene.     WPW with tachycardia: Assessed as unchanged - continues to have palpitations.  Cardiology was unsure of etiology, but they did not observe any arrhythmias. May be due to anxiety.     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 Diagnosis not clear.  Agree with allergy workup  - continue to follow up with allergy  - we again reviewed that our office would be unable to write for IV benadryl or a semi-permanent IV access.   - will follow up with hem/onc in June    Patient understands his/her medication(s). Proper use, potential side effects of these medications,  as well as barriers to using these medications were reviewed and addressed, as were over-the-counter medications and supplements during encounter. Response and side effects to current medications also reviewed.        Court Joy, MD, FACP  Assistant Professor of Clinical  Medicine  [facility]  [facility]  Department of Internal Medicine

## 2016-05-21 NOTE — Unmapped (Signed)
It was a pleasure seeing you today!    Buspirone tablets  What is this medicine?  BUSPIRONE (byoo SPYE rone) is used to treat anxiety disorders.  This medicine may be used for other purposes; ask your health care provider or pharmacist if you have questions.  What should I tell my health care provider before I take this medicine?  They need to know if you have any of these conditions:  -kidney or liver disease  -an unusual or allergic reaction to buspirone, other medicines, foods, dyes, or preservatives  -pregnant or trying to get pregnant  -breast-feeding  How should I use this medicine?  Take this medicine by mouth with a glass of water. Follow the directions on the prescription label. You may take this medicine with or without food. To ensure that this medicine always works the same way for you, you should take it either always with or always without food. Take your doses at regular intervals. Do not take your medicine more often than directed. Do not stop taking except on the advice of your doctor or health care professional.  Talk to your pediatrician regarding the use of this medicine in children. Special care may be needed.  Overdosage: If you think you have taken too much of this medicine contact a poison control center or emergency room at once.  NOTE: This medicine is only for you. Do not share this medicine with others.  What if I miss a dose?  If you miss a dose, take it as soon as you can. If it is almost time for your next dose, take only that dose. Do not take double or extra doses.  What may interact with this medicine?  Do not take this medicine with any of the following medications:  -linezolid  -MAOIs like Carbex, Eldepryl, Marplan, Nardil, and Parnate  -methylene blue  -procarbazine  This medicine may also interact with the following medications:  -diazepam  -digoxin  -diltiazem  -erythromycin  -grapefruit juice  -haloperidol  -medicines for mental depression or mood problems  -medicines for  seizures like carbamazepine, phenobarbital and phenytoin  -nefazodone  -other medications for anxiety  -rifampin  -ritonavir  -some antifungal medicines like itraconazole, ketoconazole, and voriconazole  -verapamil  -warfarin  This list may not describe all possible interactions. Give your health care provider a list of all the medicines, herbs, non-prescription drugs, or dietary supplements you use. Also tell them if you smoke, drink alcohol, or use illegal drugs. Some items may interact with your medicine.  What should I watch for while using this medicine?  Visit your doctor or health care professional for regular checks on your progress. It may take 1 to 2 weeks before your anxiety gets better.  You may get drowsy or dizzy. Do not drive, use machinery, or do anything that needs mental alertness until you know how this drug affects you. Do not stand or sit up quickly, especially if you are an older patient. This reduces the risk of dizzy or fainting spells. Alcohol can make you more drowsy and dizzy. Avoid alcoholic drinks.  What side effects may I notice from receiving this medicine?  Side effects that you should report to your doctor or health care professional as soon as possible:  -blurred vision or other vision changes  -chest pain  -confusion  -difficulty breathing  -feelings of hostility or anger  -muscle aches and pains  -numbness or tingling in hands or feet  -ringing in the ears  -skin rash  and itching  -vomiting  -weakness  Side effects that usually do not require medical attention (report to your doctor or health care professional if they continue or are bothersome):  -disturbed dreams, nightmares  -headache  -nausea  -restlessness or nervousness  -sore throat and nasal congestion  -stomach upset  This list may not describe all possible side effects. Call your doctor for medical advice about side effects. You may report side effects to FDA at 1-800-FDA-1088.  Where should I keep my medicine?  Keep out  of the reach of children.  Store at room temperature below 30 degrees C (86 degrees F). Protect from light. Keep container tightly closed. Throw away any unused medicine after the expiration date.  NOTE: This sheet is a summary. It may not cover all possible information. If you have questions about this medicine, talk to your doctor, pharmacist, or health care provider.     ?? 2016, Elsevier/Gold Standard. (2009-08-14 18:06:11)     Panic Attacks  Panic attacks are sudden, short feelings of great fear or discomfort. You may have them for no reason when you are relaxed, when you are uneasy (anxious), or when you are sleeping.  Follow these instructions at home:  ?? Take all your medicines as told.  ?? Check with your doctor before starting new medicines.  ?? Keep all doctor visits.  Contact a doctor if:  ?? You are not able to take your medicines as told.  ?? Your symptoms do not get better.  ?? Your symptoms get worse.  Get help right away if:  ?? Your attacks seem different than your normal attacks.  ?? You have thoughts about hurting yourself or others.  ?? You take panic attack medicine and you have a side effect.  This information is not intended to replace advice given to you by your health care provider. Make sure you discuss any questions you have with your health care provider.  Document Released: 02/06/2010 Document Revised: 06/12/2015 Document Reviewed: 08/18/2012  Elsevier Interactive Patient Education ?? 2017 [employer].

## 2016-05-24 MED ORDER — cromolyn (GASTROCROM) 100 mg/5 mL solution
100 | ORAL | 0 refills | 24.00000 days | Status: AC
Start: 2016-05-24 — End: 2016-06-08

## 2016-05-24 MED ORDER — levothyroxine (SYNTHROID, LEVOTHROID) 112 MCG tablet
112 | ORAL_TABLET | ORAL | 3 refills | Status: AC
Start: 2016-05-24 — End: 2017-05-07

## 2016-05-24 NOTE — Unmapped (Signed)
See other phone note from [facility] LPN

## 2016-05-26 NOTE — Unmapped (Signed)
[facility] HH cert and POC for 3/23 to 06/07/16 paperwork recvd and placed on md's desk for completion. LISA [facility] 05/26/2016 2:35 PM

## 2016-05-26 NOTE — Unmapped (Signed)
Faxed  to number given. Confirmation received, copy filed. LISA STARKEY5/11/20189:24 AM

## 2016-05-27 NOTE — Telephone Encounter (Addendum)
Pt requesting to speak with Md regarding a steroid burst     States she is having a lot of symptoms at this time and feels steroids are needed       Thank you

## 2016-05-28 NOTE — Unmapped (Signed)
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

## 2016-05-28 NOTE — Unmapped (Signed)
[facility] /PT Discharge paperwork recvd and placed on md's desk for completion. LISA [facility] 05/28/2016 3:11 PM

## 2016-05-28 NOTE — Unmapped (Signed)
Spoke to pt, informed her she will need a Dentist Eval by PT and that she will need to take this PT Discharge form with her to the appt, per Dr Alpha Gula. At her request I have mailed the form to her at [address] LISA [facility] 06/07/2016 2:55 PM

## 2016-05-29 ENCOUNTER — Inpatient Hospital Stay: Admit: 2016-05-29 | Discharge: 2016-05-29 | Disposition: A | Discharge status: Home / Self Care | Payer: MEDICARE

## 2016-05-29 ENCOUNTER — Emergency Department: Admit: 2016-05-29 | Payer: MEDICARE

## 2016-05-29 DIAGNOSIS — R55 Syncope and collapse: Secondary | ICD-10-CM

## 2016-05-29 LAB — B NATRIURETIC PEPTIDE: BNP: 10 pg/mL (ref 0–100)

## 2016-05-29 LAB — DIFFERENTIAL
Basophils Absolute: 39 /uL (ref 0–200)
Basophils Relative: 0.5 % (ref 0.0–1.0)
Eosinophils Absolute: 78 /uL (ref 15–500)
Eosinophils Relative: 1 % (ref 0.0–8.0)
Lymphocytes Absolute: 2200 /uL (ref 850–3900)
Lymphocytes Relative: 28.2 % (ref 15.0–45.0)
Monocytes Absolute: 952 /uL (ref 200–950)
Monocytes Relative: 12.2 % (ref 0.0–12.0)
Neutrophils Absolute: 4532 /uL (ref 1500–7800)
Neutrophils Relative: 58.1 % (ref 40.0–80.0)
nRBC: 0 /100 WBC (ref 0–0)

## 2016-05-29 LAB — BASIC METABOLIC PANEL
Anion Gap: 9 mmol/L (ref 3–16)
BUN: 7 mg/dL (ref 7–25)
CO2: 28 mmol/L (ref 21–33)
Calcium: 9.1 mg/dL (ref 8.6–10.3)
Chloride: 105 mmol/L (ref 98–110)
Creatinine: 0.73 mg/dL (ref 0.60–1.30)
Glucose: 99 mg/dL (ref 70–100)
Osmolality, Calculated: 292 mosm/kg (ref 278–305)
Potassium: 3.4 mmol/L — ABNORMAL LOW (ref 3.5–5.3)
Sodium: 142 mmol/L (ref 133–146)
eGFR AA CKD-EPI: >90 See note.
eGFR NONAA CKD-EPI: >90 See note.

## 2016-05-29 LAB — CBC
Hematocrit: 42.4 % (ref 35.0–45.0)
Hemoglobin: 13.5 g/dL (ref 11.7–15.5)
MCH: 28 pg (ref 27.0–33.0)
MCHC: 31.8 g/dL — ABNORMAL LOW (ref 32.0–36.0)
MCV: 88 fL (ref 80.0–100.0)
MPV: 8.5 fL (ref 7.5–11.5)
Platelets: 293 10E3/uL (ref 140–400)
RBC: 4.82 10E6/uL (ref 3.80–5.10)
RDW: 14.2 % (ref 11.0–15.0)
WBC: 7.8 10E3/uL (ref 3.8–10.8)

## 2016-05-29 LAB — VENOUS BLOOD GAS, LINE/SYRINGE
%HBO2-Line Draw: 53.6 % (ref 40.0–70.0)
Base Excess-Line Draw: 2.7 mmol/L (ref <=3.0)
CO2 Content-Line Draw: 30 mmol/L — ABNORMAL HIGH (ref 25–29)
Carboxyhgb-Line Draw: 0.8 % (ref 0.0–2.0)
HCO3-Line Draw: 28 mmol/L (ref 24–28)
Methemoglobin-Line Draw: 0.5 % (ref 0.0–1.5)
PCO2-Line Draw: 46 mmHg (ref 41–51)
PH-Line Draw: 7.4 (ref 7.32–7.42)
PO2-Line Draw: 29 mmHg (ref 25–40)
Reduced Hemoglobin-Line Draw: 45.1 % — ABNORMAL HIGH (ref 0.0–5.0)

## 2016-05-29 LAB — TROPONIN I: Troponin I: <0.04 ng/mL (ref 0.00–0.03)

## 2016-05-29 MED ORDER — diphenhydrAMINE (BENADRYL) injection 25 mg
50 | Freq: Once | INTRAMUSCULAR | Status: AC
Start: 2016-05-29 — End: 2016-05-29
  Administered 2016-05-29: 18:00:00 25 mg via INTRAVENOUS

## 2016-05-29 MED ORDER — famotidine (PF) (PEPCID) injection 20 mg
20 | Freq: Once | INTRAVENOUS | Status: AC
Start: 2016-05-29 — End: 2016-05-29
  Administered 2016-05-29: 18:00:00 20 mg via INTRAVENOUS

## 2016-05-29 MED ORDER — methylPREDNISolone sod suc(PF) (SOLU-medrol) SolR 125 mg
125 | Freq: Once | INTRAMUSCULAR | Status: AC
Start: 2016-05-29 — End: 2016-05-29
  Administered 2016-05-29: 18:00:00 125 mg via INTRAVENOUS

## 2016-05-29 MED ORDER — lactated Ringers 1,000 mL IV fluid
Freq: Once | INTRAVENOUS | Status: AC
Start: 2016-05-29 — End: 2016-05-29
  Administered 2016-05-29: 18:00:00 1000 mL via INTRAVENOUS

## 2016-05-29 MED ORDER — methylPREDNISolone (MEDROL DOSEPACK) 4 mg tablet
4 | PACK | ORAL | 0 refills | Status: AC
Start: 2016-05-29 — End: 2016-06-02

## 2016-05-29 MED FILL — DIPHENHYDRAMINE 50 MG/ML INJECTION SOLUTION: 50 50 mg/mL | INTRAMUSCULAR | Qty: 1

## 2016-05-29 MED FILL — SOLU-MEDROL (PF) 125 MG/2 ML SOLUTION FOR INJECTION: 125 125 mg/2 mL | INTRAMUSCULAR | Qty: 2

## 2016-05-29 MED FILL — FAMOTIDINE (PF) 20 MG/2 ML INTRAVENOUS SOLUTION: 20 20 mg/2 mL | INTRAVENOUS | Qty: 2

## 2016-05-29 NOTE — Unmapped (Signed)
Comfortable, playing on phone.  No SOB or resp distress noted.

## 2016-05-29 NOTE — Unmapped (Signed)
[place] ED Note    Date of Service: 05/29/2016    Reason for Visit: Syncope      Patient History     HPI:  This is a 56 y.o. 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  Patient was coming out of the bathroom and felt lightheaded and had some blurred vision and fell to the ground.  She was unable to walk to her couch and called for help.  She did not have preceding chest pain.  She has had some mild shortness of breath recently which she says accompanies her flares.  She was recently taken off IV Benadryl which she used through her port and is now on oral Benadryl and does not think it is helping.  She no longer has a port.  She denies any recent swelling in the legs.  She reports intermittent nausea and diarrhea but does not have it currently.  She still has some occasional blurry vision.  She denies any palpitations.  She thinks that she needs Pepcid and Benadryl and steroids at this time based on her prior history.    The patient states that there were no other associated signs and symptoms or modifying factors.    History reviewed. No pertinent past medical history.    Past Surgical History:   Procedure Laterality Date   ??? CHOLECYSTECTOMY         Emily Bonilla  reports that she has never smoked. She has never used smokeless tobacco. She reports that she does not drink alcohol or use drugs.    family history is not on file.    Previous Medications    BUSPIRONE (BUSPAR) 5 MG TABLET    Take 1 tablet (5 mg total) by mouth 2 times a day.    CETIRIZINE (ZYRTEC) 10 MG TABLET    Take 60 mg by mouth.              CROMOLYN (GASTROCROM) 100 MG/5 ML SOLUTION    Take 10 ml by mouth 3 times daily    DIPHENHYDRAMINE (BENADRYL) 50 MG CAPSULE    Take by mouth every 4 hours.              EPINEPHRINE 1 MG/ML (1 ML) SOLN INJECTION    Inject into the muscle.    FEXOFENADINE (ALLEGRA) 180 MG TABLET     Take 180 mg by mouth.    FUROSEMIDE (LASIX) 20 MG TABLET    Take 1 tablet (20 mg total) by mouth daily.    IMMUNE GLOBULIN,GAMMA,IGG, (IMMUNE GLOBULIN, HUMAN,, IGG, IV)    Inject 6 g subcutaneously.    IRON DEXTRAN COMPLEX (INFED) 100 MG/2 ML (50 MG/ML) SOLN INJECTION    Inject into the vein.    LEVALBUTEROL (XOPENEX) 1.25 MG/3 ML NEBULIZER SOLUTION    1.25 mg.    LEVOTHYROXINE (SYNTHROID, LEVOTHROID) 112 MCG TABLET    Take one tablet by mouth once daily    MONTELUKAST (SINGULAIR) 10 MG TABLET    Take 1 tablet (10 mg total) by mouth at bedtime.    OMEPRAZOLE (PRILOSEC) 40 MG CAPSULE    Take 40 mg by mouth.    ONABOTULINUMTOXINA (BOTOX COSMETIC) 50 UNIT SOLR    Inject into the muscle.    RANITIDINE (ZANTAC) 300 MG TABLET    Take 1 tablet (300 mg total) by mouth 3 times a day.       Allergies:  Allergies as of 05/29/2016 - Fully Reviewed 05/29/2016   Allergen Reaction Noted   ??? Acetaminophen Anaphylaxis 11/17/2015   ??? Aspirin Anaphylaxis 09/27/2015   ??? Blue dye Anaphylaxis 11/17/2015   ??? Epinephrine Anaphylaxis 11/25/2015   ??? Flecainide Shortness Of Breath 02/06/2016   ??? Gabapentin Anaphylaxis 11/17/2015   ??? Iodinated contrast- oral and iv dye Anaphylaxis 11/07/2015   ??? Isopropyl alcohol Anaphylaxis 11/07/2015   ??? Latex Anaphylaxis and Rash 09/27/2015   ??? Lidocaine Anaphylaxis 09/27/2015   ??? Nsaids (non-steroidal anti-inflammatory drug) Anaphylaxis 11/07/2015   ??? Onion Anaphylaxis 11/17/2015   ??? Other Anaphylaxis 09/27/2015   ??? Perfume Anaphylaxis 11/07/2015   ??? Pregabalin Anaphylaxis 09/27/2015   ??? Quetiapine Anaphylaxis 11/07/2015   ??? Red dye Anaphylaxis 11/07/2015   ??? Sulfur Anaphylaxis 11/25/2015   ??? Yellow dye Anaphylaxis 11/07/2015   ??? Doxepin Other (See Comments) 11/17/2015   ??? Duloxetine  11/17/2015   ??? Hydroxyzine Itching 11/07/2015   ??? Levofloxacin Itching 11/07/2015   ??? Montelukast  11/07/2015   ??? Verapamil (bulk) Hives 02/06/2016   ??? Adhesive tape-silicones Itching, Rash, and Swelling 09/27/2015   ???  Trazodone Rash 11/07/2015       PMH: Nursing notes reviewed   PSH: Nursing notes reviewed   FH: Nursing notes reviewed   MEDS: Nursing notes and chart reviewed     Review of Systems     ROS:  See HPI. Specifically, the patient denies Chest pain or headache or abdominal pain  All other systems negative as reported by the patient.    Physical Exam     ED Triage Vitals   Vital Signs Group      Temp       Temp src       Pulse       Heart Rate Source       Resp       SpO2       BP       MAP (mmHg)       BP Location       BP Method       Patient Position    SpO2    O2 Device      BP 133/79 (BP Location: Left arm, Patient Position: Sitting)    Pulse 76    Temp 98 ??F (36.7 ??C) (Oral)    Resp 16    Ht 5' 6 (1.676 m)    Wt 170 lb (77.1 kg)    SpO2 97%    BMI 27.44 kg/m??     General:  well nourished; well developed; in no apparent distress   HEENT:  normocephalic, atraumatic; pupils equal, round and reactive; sclera anicteric; conjunctiva pink; moist mucous membranes; no oropharyngeal erythema or mucosal lesions  Neck:  no meningismus; no lymphadenopathy; no JVD   Pulmonary:   lung sounds clear to auscultation bilaterally with good air entry; no respiratory distress; no wheezes or crackles   Cardiac:  regular rate and rhythm with no murmurs, rubs, or gallops   Abdomen:  soft; non-tender, non-distended; no rebound tenderness or guarding; normal bowel sounds   GU: deferred   Musculoskeletal:  atraumatic exam with no focal swelling or tenderness, no peripheral edema  Vascular:  2+ peripheral pulses in bilateral upper and lower extremities  Skin:  warm and well perfused without rashes or lesions  Neuro:  alert and oriented x4; cranial nerves II - XII grossly intact; normal gait; strength and sensation grossly intact   Psych:  appropriate  mood and affect; Normal mentation without evidence of hallucinations of mental disfunction       Diagnostic Studies     Labs:  Please see electronic medical record for any tests performed in the  ED     Radiology:  X-ray Chest Pa And Lateral    Result Date: 05/29/2016  Exam: XR CHEST PA AND LATERAL dated 05/29/2016 1:56 PM EDT CLINICAL HISTORY: Cough; COMPARISON: None FINDINGS : The cardiomediastinal silhouette is normal. The lungs are clear without focal consolidation to suggest pneumonia. There is no pneumothorax or pleural effusion. The bony thorax is intact.     IMPRESSION: No acute cardiopulmonary process. Approved by Fulton Reek on 05/29/2016 2:10 PM EDT I have personally reviewed the images and I agree with this report. Report Verified by: Renella Cunas at 05/29/2016 2:16 PM EDT      EKG:  Indication: Syncope  Rate: 68  Rhythm: Sinus rhythm  Intervals: PR 134, QRS 82, QTC 489  Axis: Normal  ST/T Waves: No acute ST elevation or depression or signs of right ventricular strain; no WPW or Brugada  Comparison: No prior    Emergency Department Procedures       ED Course and MDM     Emily Bonilla is a 56 y.o. female with a history and presentation as described above in HPI. The patient was evaluated by myself and the ED Attending Physician, Dr. Rubye Oaks. All management and disposition plans were discussed and agreed upon.    Upon presentation, the patient was Afebrile, hemotympanum stable and not acute distress.  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  She feels like she is currently having a flare but has no clinical signs of anaphylaxis currently.  She felt like she would need H1 and H2 blockers and steroids and fluids which normally improves her symptoms.  These were administered to her and she reported complete resolution in her symptoms.  She also reported that the mental fog had been lifted.  She also reports that now she is able to play a video game on her phone which is normally a good sign for her.  Her laboratory studies are reassuring.  Troponin and BNP are negative.  EKG without ischemia or evidence of WPW or Brugada or other signs concerning to her  presyncopal episode.  Her QTC is 489, but no evidence of torsades.  Chest x-ray without any mediastinal widening or new cardiomegaly or pulmonary edema.  History not suspicious for dissection.  Suspect this was a vasovagal versus orthostatic event or possibly related to her unclear allergen history.  She is now well appearing.  She is ambulating without assistance.  We'll have her follow-up with her allergist and PCP as scheduled.    Consults:  none    Summary of Treatment in ED:    Medications   lactated Ringers 1,000 mL IV fluid (1,000 mLs Intravenous New Bag 05/29/16 1407)   famotidine (PF) (PEPCID) injection 20 mg (20 mg Intravenous Given 05/29/16 1407)   methylPREDNISolone sod suc(PF) (SOLU-medrol) SolR 125 mg (125 mg Intravenous Given 05/29/16 1407)   diphenhydrAMINE (BENADRYL) injection 25 mg (25 mg Intravenous Given 05/29/16 1407)       Impression     1. Pre-syncope         Disposition       1. The patient is to be discharged home in stable/improved condition.  2. Workup, treatment and diagnosis were discussed with the patient and/or family members; the patient agrees to the  plan and all questions were addressed and answered.  3.The patient is instructed to return to the emergency department should her symptoms worsen or any concern she believes warrants acute physician evaluation.  4. The patient will be discharged on the following medications, which were discussed with the patient:    Starnisha, Batrez   Home Medication Instructions [id number]:[id number]    Printed on:05/29/16 1330   Medication Information                      busPIRone (BUSPAR) 5 MG tablet  Take 1 tablet (5 mg total) by mouth 2 times a day.             cetirizine (ZYRTEC) 10 MG tablet  Take 60 mg by mouth.                       cromolyn (GASTROCROM) 100 mg/5 mL solution  Take 10 ml by mouth 3 times daily             diphenhydrAMINE (BENADRYL) 50 MG capsule  Take by mouth every 4 hours.                       EPINEPHrine 1 mg/mL (1 mL) Soln  injection  Inject into the muscle.             fexofenadine (ALLEGRA) 180 MG tablet  Take 180 mg by mouth.             furosemide (LASIX) 20 MG tablet  Take 1 tablet (20 mg total) by mouth daily.             IMMUNE GLOBULIN,GAMMA,IGG, (IMMUNE GLOBULIN, HUMAN,, IGG, IV)  Inject 6 g subcutaneously.             iron dextran complex (INFED) 100 mg/2 mL (50 mg/mL) Soln injection  Inject into the vein.             levalbuterol (XOPENEX) 1.25 mg/3 mL nebulizer solution  1.25 mg.             levothyroxine (SYNTHROID, LEVOTHROID) 112 MCG tablet  Take one tablet by mouth once daily             montelukast (SINGULAIR) 10 mg tablet  Take 1 tablet (10 mg total) by mouth at bedtime.             omeprazole (PRILOSEC) 40 MG capsule  Take 40 mg by mouth.             onabotulinumtoxinA (BOTOX COSMETIC) 50 unit SolR  Inject into the muscle.             ranitidine (ZANTAC) 300 MG tablet  Take 1 tablet (300 mg total) by mouth 3 times a day.                 Thurmond Butts, MD, PGY-3  [facility]       Thurmond Butts, MD  Resident  05/29/16 [phone]

## 2016-05-29 NOTE — Unmapped (Signed)
States has been trying  To  Contact MD's for 3 weeks for increased benadryl and steroids, but no one has returned her calls.

## 2016-05-29 NOTE — Unmapped (Signed)
Pt states she has a lot of allergies.

## 2016-05-29 NOTE — Unmapped (Signed)
Bed: A02U  Expected date:   Expected time:   Means of arrival:   Comments:  HOLD FOR SRU BUMP OUT AFTER HE IS FINISHED GETTING SUTURED.

## 2016-05-29 NOTE — Unmapped (Signed)
Up to the bathroom.  Gait steady.  States swelling in urethra has decreased allowing her to void.  States thinking more clearly and vision less blurry.  Returned to room without event.

## 2016-05-29 NOTE — Unmapped (Signed)
Extensive discussion about allergic reaction to unknown substance today.  States would like to be place in an assisted living situation since home health does not seem to be enough to help her.  She would like to be place back on IV or IM benadryl to prevent further allergic reactions.  States had some confusion this morning with reaction.  Now mentation clear.

## 2016-05-29 NOTE — Unmapped (Signed)
Future Appointments  Date Time Provider Department Center   06/17/2016 10:00 AM [address] [place], DO [facility] HOX HOX   06/24/2016 1:15 PM Kalman Drape, MD [facility] PS[facility] OP OP   07/05/2016 2:10 PM Loleta Books, MD [facility]   07/28/2016 9:30 AM Hershal Coria, MD [facility] H[facility] HOX

## 2016-05-29 NOTE — Unmapped (Signed)
ED Attending Attestation Note    Date of service:  05/29/2016    This patient was seen by the resident physician.  I have seen and examined the patient, agree with the workup, evaluation, management and diagnosis. The care plan has been discussed and I concur.  I have reviewed the ECG and concur with the resident's interpretation.    My assessment reveals a 56 y.o. female presents to the ED with what she reports as a mast cell degranulation flare.  Exam reveals clear lungs.

## 2016-05-31 NOTE — Unmapped (Signed)
Patient called  Phone [phone] (home)     Pharmacy phone / location    Passed out Saturday and needs to speak with allergist about starting IM benedryl

## 2016-06-01 ENCOUNTER — Inpatient Hospital Stay: Admit: 2016-06-01 | Discharge: 2016-06-01 | Disposition: A | Discharge status: Home / Self Care | Payer: MEDICARE

## 2016-06-01 ENCOUNTER — Emergency Department: Admit: 2016-06-01 | Payer: MEDICARE

## 2016-06-01 DIAGNOSIS — T7840XA Allergy, unspecified, initial encounter: Secondary | ICD-10-CM

## 2016-06-01 MED ORDER — albuterol (PROVENTIL) nebulizer solution 2.5 mg
2.5 | Freq: Once | RESPIRATORY_TRACT | Status: AC
Start: 2016-06-01 — End: 2016-06-01
  Administered 2016-06-01: 16:00:00 2.5 mg via RESPIRATORY_TRACT

## 2016-06-01 MED ORDER — sodium chloride 0.9 % 250 mL IV fluid
Freq: Once | INTRAVENOUS | Status: AC
Start: 2016-06-01 — End: 2016-06-01
  Administered 2016-06-01: 16:00:00 via INTRAVENOUS

## 2016-06-01 MED ORDER — predniSONE (DELTASONE) tablet 60 mg
20 | Freq: Once | ORAL | Status: AC
Start: 2016-06-01 — End: 2016-06-01
  Administered 2016-06-01: 16:00:00 60 mg via ORAL

## 2016-06-01 MED FILL — PREDNISONE 20 MG TABLET: 20 20 MG | ORAL | Qty: 3

## 2016-06-01 MED FILL — ALBUTEROL SULFATE 2.5 MG/3 ML (0.083 %) SOLUTION FOR NEBULIZATION: 2.5 2.5 mg /3 mL (0.083 %) | RESPIRATORY_TRACT | Qty: 6

## 2016-06-01 NOTE — ED Notes (Signed)
Appears comfortable without SOB or resp distress.

## 2016-06-01 NOTE — Telephone Encounter (Signed)
Pt also states a Rx for a Jet Nebulizer was to be sent to her pharmacy during her last visit     Pt was recently seen in ED this past weekend and today     Thank you

## 2016-06-01 NOTE — Unmapped (Signed)
Called, no answer.  I spoke to Palak, RN earlier who will schedule patient to see me tomorrow at 11:30 AM.

## 2016-06-01 NOTE — ED Provider Notes (Signed)
[place] ED Note    Patient: Emily Bonilla   Date of Service: 06/01/2016  Reason for Visit: Allergic Reaction      Patient History     HPI:  56 y.o. 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 Unclear offending agent. Developed acute shortness of breath, wheezing, stridor this morning. EMS summoned - administered DuoNeb and 50 mg diphenhydramine IV en route. The pt denies CP, SOB, nausea, vomiting, abdominal pain, urticaria.     History reviewed. No pertinent past medical history.    Past Surgical History:   Procedure Laterality Date    CHOLECYSTECTOMY          reports that she has never smoked. She has never used smokeless tobacco. She reports that she does not drink alcohol or use drugs.    Previous Medications    BUSPIRONE (BUSPAR) 5 MG TABLET    Take 1 tablet (5 mg total) by mouth 2 times a day.    CETIRIZINE (ZYRTEC) 10 MG TABLET    Take 60 mg by mouth.              CROMOLYN (GASTROCROM) 100 MG/5 ML SOLUTION    Take 10 ml by mouth 3 times daily    DIPHENHYDRAMINE (BENADRYL) 50 MG CAPSULE    Take by mouth every 4 hours.              EPINEPHRINE 1 MG/ML (1 ML) SOLN INJECTION    Inject into the muscle.    FEXOFENADINE (ALLEGRA) 180 MG TABLET    Take 180 mg by mouth.    FUROSEMIDE (LASIX) 20 MG TABLET    Take 1 tablet (20 mg total) by mouth daily.    IMMUNE GLOBULIN,GAMMA,IGG, (IMMUNE GLOBULIN, HUMAN,, IGG, IV)    Inject 6 g subcutaneously.    IRON DEXTRAN COMPLEX (INFED) 100 MG/2 ML (50 MG/ML) SOLN INJECTION    Inject into the vein.    LEVALBUTEROL (XOPENEX) 1.25 MG/3 ML NEBULIZER SOLUTION    1.25 mg.    LEVOTHYROXINE (SYNTHROID, LEVOTHROID) 112 MCG TABLET    Take one tablet by mouth once daily    METHYLPREDNISOLONE (MEDROL DOSEPACK) 4 MG TABLET    follow package directions    MONTELUKAST (SINGULAIR) 10 MG TABLET    Take 1 tablet (10 mg total) by mouth at bedtime.    OMEPRAZOLE (PRILOSEC) 40 MG  CAPSULE    Take 40 mg by mouth.    ONABOTULINUMTOXINA (BOTOX COSMETIC) 50 UNIT SOLR    Inject into the muscle.    RANITIDINE (ZANTAC) 300 MG TABLET    Take 1 tablet (300 mg total) by mouth 3 times a day.       Allergies:   Allergies as of 06/01/2016 - Fully Reviewed 06/01/2016   Allergen Reaction Noted    Acetaminophen Anaphylaxis 11/17/2015    Aspirin Anaphylaxis 09/27/2015    Blue dye Anaphylaxis 11/17/2015    Epinephrine Anaphylaxis 11/25/2015    Flecainide Shortness Of Breath 02/06/2016    Gabapentin Anaphylaxis 11/17/2015    Iodinated contrast- oral and iv dye Anaphylaxis 11/07/2015    Isopropyl alcohol Anaphylaxis 11/07/2015    Latex Anaphylaxis and Rash 09/27/2015    Lidocaine Anaphylaxis 09/27/2015    Nsaids (non-steroidal anti-inflammatory drug) Anaphylaxis 11/07/2015    Onion Anaphylaxis 11/17/2015    Other Anaphylaxis 09/27/2015    Perfume Anaphylaxis 11/07/2015    Pregabalin Anaphylaxis 09/27/2015    Quetiapine Anaphylaxis 11/07/2015    Red dye Anaphylaxis  11/07/2015    Sulfur Anaphylaxis 11/25/2015    Yellow dye Anaphylaxis 11/07/2015    Doxepin Other (See Comments) 11/17/2015    Duloxetine  11/17/2015    Hydroxyzine Itching 11/07/2015    Levofloxacin Itching 11/07/2015    Montelukast  11/07/2015    Verapamil (bulk) Hives 02/06/2016    Adhesive tape-silicones Itching, Rash, and Swelling 09/27/2015    Trazodone Rash 11/07/2015       Review of Systems     ROS:  A full 10 point review of systems was performed. Positive for stridor, wheezing. Negative for rash, emesis. Otherwise negative except as in HPI.     Physical Exam     Vitals:    06/01/16 1151 06/01/16 1206 06/01/16 1209   BP: 142/87  146/79   BP Location: Right arm  Right arm   Patient Position: Sitting  Sitting   Pulse: 81     Resp: 18 18    Temp: 98.6 F (37 C)     TempSrc: Oral     SpO2: 97% 96% 99%   Weight: 170 lb (77.1 kg)     Height: 5' 6 (1.676 m)         General:  Non-toxic, NAD     HEENT:  Normocephalic,  atraumatic, PERRL, mucous membranes are moist    Neck:  Supple    Pulmonary:   No increased work of breathing; scant end expiratory wheezing, no significant prolongation of expiratory phase    Cardiac:  Regular rate/rhythm with no appreciable murmurs/rubs/gallops     Abdomen:  Soft, nontender, nondistended; no rebound or guarding     Musculoskeletal:  Strength grossly intact without obvious injury or deformity     Neuro:  CN II-XII grossly intact, moves all four extremities       Diagnostic Studies     Labs: Please see electronic medical record for any tests performed in the ED     Radiology: Please see electronic medical record for any tests performed in the ED      ED Course and MDM     This is a 56 y.o. female who presented to the emergency department with shortness of breath. On arrival, the patient is afebrile and nontoxic in appearance.  She is able to maintain adequate oxygenation on room air.  Occasionally, there is stridor noted, but when the patient is providing history, there is no noted stridor.  Auscultation of the lungs is notable for scant expiratory wheezing, good air flow, no significant prolongation of the expiratory phase.  Cardiac exam reveals no murmur.  The patient does not meet criteria for anaphylaxis and therefore I will not administer epinephrine.  She is alert and received diphenhydramine prior to arrival.  The patient is currently taking a Medrol Dosepak after her last recent presentation.  We will give him a one-time dose of 60 mg prednisone here in the emergency department.  We'll also administer additional of-year-old treatments.  We'll administer gentle IV fluids at the patient's request. CXR reveals no acute abnormality.     The patient reports some improvement following the above interventions.  She was observed for  several hours in the emergency department without worsening of her symptoms.  Repeat pulmonary exam reveals no evidence of wheezing.  The patient states she intends to  follow up with her primary care physician for chronic management of her mast cell patient's syndrome.  I sent a note to the patient's prior care physician, as well, to inform them of the  patient's presentation and some difficulty she has had in scheduling an appointment.    The plan is discharged to home with outpatient follow up her usual strict return precautions for new or worsening symptoms communicated.  The patient is to finish her previously prescribed Medrol Dosepak.      Clinical Impression:   1. Allergic reaction, initial encounter        Dispo: discharge    ---  Patriciaann Clan, MD   [facility]     Patriciaann Clan, MD  Resident  06/01/16 [phone]

## 2016-06-01 NOTE — ED Triage Notes (Signed)
Pt reports allergic reaction and squad reported stridor on arrival

## 2016-06-01 NOTE — ED Notes (Signed)
[facility]    Trauma / Critically Ill Assessment      Emily Bonilla  [id number]    Reason for Referral / Presenting Problem:  Allergic reaction           Family Contact and Involvement:      -De Hollingshead (brother) [phone]               Assessment and Social Work Interventions: Patient is a 56 year old female who arrived in the [facility] via ambulance for treatment of an allergic reaction.  Patient stated that she moved to [facility] to find allergy specialists after her previous doctor retired.  According to patient ,all of her family live out of state.  Per patient , she has home health and has her food and other items that meets her needs delivered to her residence.     Safety Concerns:None known.                                                                               Referral / Disposition Plan:  Patient discharged.  No further social work intervention indicated.    Britta Mccreedy Furman Trentman,LISW-S

## 2016-06-01 NOTE — Unmapped (Signed)
ED Attending Attestation Note    Date of service:  06/01/2016    This patient was seen by the resident physician.  I have seen and examined the patient, agree with the workup, evaluation, management and diagnosis. The care plan has been discussed and I concur.     My assessment reveals a 56 y.o. female with a history of prior mast cell disorder causing allergic reactions who comes in because of shortness of breath.  The patient reports this is similar to prior exacerbations.  On my exam, the patient has no respiratory distress, no airway involvement, no hives, no abdominal tenderness, and has minimal late expiratory wheeze.  As such, no indication for epinephrine at this time.  We will treat with albuterol and monitor for any clinical change.  The patient reports this is identical to prior episodes.    Jules Husbands, MD

## 2016-06-01 NOTE — ED Notes (Signed)
MD Samson Frederic at [facility] speaking with pt re: plan and discharge.

## 2016-06-01 NOTE — Unmapped (Signed)
PCN CARE COORDINATION     Chart reviewed. Patient seen in ED recently. Patient has already made contact with PCP's office for  appropriate follow up. No outreach from primary care management indicated.     Alois Cliche, RN  Care Management-[facility]  [phone]

## 2016-06-01 NOTE — Telephone Encounter (Addendum)
Called pt and scheduled appointment per Dr. Alpha Gula at 11.30pm tomorrow. Pt notified of the appointment. [facility]. Ph no to [facility] [phone], work cell [phone]    Future Appointments  Date Time Provider Department Center   06/02/2016 11:30 AM Court Joy, MD [facility] HOX HOX   06/17/2016 10:00 AM Metta Clines, DO [facility] HOX HOX   06/24/2016 1:15 PM Kalman Drape, MD [facility] PSY OP OP   07/05/2016 2:10 PM Loleta Books, MD [facility]   07/28/2016 9:30 AM Hershal Coria, MD [facility] HOX HOX

## 2016-06-01 NOTE — Telephone Encounter (Signed)
HHC RN CM reporting aide present in home this with patient who is crying uncontrollably.  Aide not able to console Ms. Hoppes.  Patient not in agreement to have aide assist with personal care. HHC RN Case Manager reports if nurse unable stabilize patient's MH will call Mobile Crisis. Educated HHC RN CM that patient has upcoming appointments in [facility] 06[facility]24[facility]16 and [facility] Clinic 06/17/16.  SW requested this be reviewed with patient at next [facility] visit if patient MH status results in need of a reminder.  Patient HHC recertification due in few weeks and HHN unable to find skilled need to recertify patient at this time.  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  No other skilled service noted at this time.  Patient d/c from home PT several weeks ago. No further PT need observed by [facility].  HHC applied for [facility] [employer].  No contact has been made by [facility] for date and time of assessment yet.  HHN will follow up on this at today's visit and follow up with SW accordingly.

## 2016-06-01 NOTE — Unmapped (Signed)
Pt calling to report that she was rushed to ER by Squad  For Allergic reaction  to heat, Activity, Go dizzy and Woozy at home after Explosive diarrhea    Was told to FU with PCP by 06/05/16, she sates she has left multiples messages with the Allergy clinic and waiting on a call back to get an appt    She refused an appt with any of the partners saying they would not know how to manage her because what she has is rare    She would like to be seen by you this this thursday or Friday, ( Your schedule is full)    Right now she has no anxiety or confusion, she will finish her Medrol dose Pak on Thursday, A Home health Nurse has not yet come to see her    She states she is a Engineer, 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

## 2016-06-01 NOTE — ED Notes (Signed)
Report from [facility]. Pt moved to A5 for continued care, pt on BS CMU, awaiting further dispo. NAD.

## 2016-06-01 NOTE — Unmapped (Signed)
Bed: A05U  Expected date: 06/01/16  Expected time:   Means of arrival:   Comments:  SRU bump

## 2016-06-02 ENCOUNTER — Ambulatory Visit: Admit: 2016-06-02 | Discharge: 2016-06-02 | Payer: MEDICARE

## 2016-06-02 DIAGNOSIS — T7840XD Allergy, unspecified, subsequent encounter: Secondary | ICD-10-CM

## 2016-06-02 MED ORDER — levalbuterol (XOPENEX) 1.25 mg/3 mL nebulizer solution
1.25 | Freq: Four times a day (QID) | RESPIRATORY_TRACT | 0 refills | Status: AC | PRN
Start: 2016-06-02 — End: 2016-09-16

## 2016-06-02 MED ORDER — predniSONE (DELTASONE) 10 MG tablet
10 | ORAL_TABLET | ORAL | 0 refills | Status: AC
Start: 2016-06-02 — End: 2016-07-28
  Filled 2016-06-02: qty 27, 9d supply, fill #0

## 2016-06-02 NOTE — Unmapped (Signed)
PCN CARE COORDINATION     Chart reviewed. Patient seen in ED recently. Patient has already had appropriate follow up. No outreach from primary care management indicated.     Wendy Nilani Hugill, RN  [facility] Management[facility]  [phone]

## 2016-06-02 NOTE — Unmapped (Signed)
[facility] Internal Medicine  Faculty Practice Acute Visit   Subjective:   Pre-visit planning done, including pre-visit huddle with nursing and MA staff prior to session.    Patient ID: Emily Bonilla is a 56 y.o. lady with a history of WPW, syncope, anxiety and depression, as well as Mast Cell activation syndrome, here for acute visit for post-ED follow up.     HPI  Patient presented to the [facility] ED for an allergic agent - patient not sure of trigger.  This was her second ED visit in May. She is followed by allergy clinic, who recently increased the duration of her prednisone.  Patient states she was written for fexofenadine, benadryl tablets - patient is concerned that she is allergic to the artificial (red) dye, as well as fillers.      Patient states she had nausea, explosive diarrhea, syncopal episode, but I woke up as soon as I hit the ground.  She states she wasn't sure what triggered this episode, which she feels is typical for her allergic response.  She states I'm off my IV benadryl and I'm below my threshold.      She states in the past she would treat herself with IM benadryl, as well as home IVF, but her physician retired and she has been off of IM benadryl since January.      Patient states she needs to go back to assisted living. Patient states I'm leaving the stove on, I'm wondering the house when I'm reacting.  I go to the grocery store and I hit the ground seizing.      the steroids have helped to prevent all of these - but I can't see my allergist until the 31st.  Allergy did refill a steroid regimen for her.     The following portions of the patient's history were reviewed and updated as appropriate: allergies, current medications, past family history, past medical history, past social history, past surgical history and problem list.    Review of Systems  As above, otherwise negative.   Objective:    Physical Exam   Nursing note and vitals reviewed.  Constitutional: She is oriented to  person, place, and time.   Pleasant lady, sitting in chair, slightly anxious, but in no acute distress.   Musculoskeletal: Normal range of motion.   Normal gait   Neurological: She is alert and oriented to person, place, and time.   Moving all 4 ext.   Skin: Skin is warm and dry.   Psychiatric:   As above, slightly anxious, intermittently tearful, but otherwise normal mood and affect. Normal thought content.       Assessment:   Please see below   Plan:   Allergic reaction in patient with Mast Cell activation Syndrome with anxiety and depression: currently relatively stable on steroid burst.  She does have significant anxiety over her reactions, but she has struggled to get an appointment (reportedly has a new patient appointment with psychiatry in early June).  She is followed by Allergy. She states on her steroid regimen, she states she feels safe to go home and feels her symptoms have been controlled, but she is worried that she may encounter a trigger and have another reaction.  She is also frustrated as [facility] [employer] manager [facility] has not been able to help her in a timely fashion with her home health (they will be able to see her tomorrow).   - continue steroid regimen as per allergy  - [facility] will try  to get patient more frequent home health visits.  I will also discuss this with our social worker to see if our office can be of assistance.   - if symptoms recur, or if she does not feel safe at home, she is instructed to call 911 and go to the ED.  Emergency Dept are encouraged to call me with any questions - patient is complex and we don't fully understand underlying etiology or source of her reactions.  If she returns to ED, it may be reasonable to admit her to the [facility] Medicine service for an inpatient Allergy consult, PT/OT assessment and SNF placement.     Please call me with any questions or concerns.     Court Joy, MD, FACP  Assistant Professor of Clinical Medicine  [facility] Faculty  Practice  [facility]  Department of Internal Medicine  m. [phone]  06/02/2016   12:14 PM       **I spent greater than 25 minutes of direct face to face time with patient, including time spent evaluating, examining and discussing her current disease processes and plan of treatment. Greater than half of the above time was spent counseling patient on current chief complaint and disease processes.

## 2016-06-02 NOTE — Unmapped (Signed)
It was a pleasure seeing you today!

## 2016-06-02 NOTE — Unmapped (Signed)
Emily Bonilla is a 56yo CF with PMH of previous diagnosis of mastocytosis, though pt does not fulfill WHO guidelines criteria for this condition (please see Allergy note from 4/26 for full details on previous work-up by multiple specialists), anxiety, and depression who called regarding advice and original request for IM benadryl.       Pt states that she has been having multiple episodes in the past week, for which she has gone to the ED twice.  She describes her sx as the following--flushing, foginess, I forget things a lot, I get short of breath.  She has been using the following medications for her episodes:  Cetirizine 10mg  six times daily  Fexofenadine 180mg  daily  Montelukast 10mg  QHS  Benadryl 50mg  QID  Cromolyn 100mg /56mL (10mL TID)  Also has Epi-pen but has not had to use    Per ER note from 5/12: Patient was coming out of the bathroom and felt lightheaded and had some blurred vision and fell to the ground.  She was unable to walk to her couch and called for help.  She did not have preceding chest pain.  She has had some mild shortness of breath recently which she says accompanies her flares. . . . She still has some occasional blurry vision.  She denies any palpitations.  She thinks that she needs Pepcid and Benadryl and steroids at this time based on her prior history.      Assessment from ER: Pt was noted to have normal vital signs with normal labwork, including cardiac w/u. No sx of anaphylaxis noted.  Pt was given IVF, steroids, H1 and H2 blockers--stated that she had complete resolution of sx w/lifting of mental foginess and was able to play video game on phone.  ED dx her with possible vasovagal versus orthostatic event.  She was sent home in stable condition.    Per ER note from 5/15: Pt p/w c/o acute SoB, wheezing, and stridor related to unclear offending agent.  EMS gave DuoNeb and 50mg  IV benadryl en route.  On arrival to ED, pt maintained adequate oxygenation on RA.  It was also noted while  occasional stridor was noted, there was none while the patient was speaking.  She also had some scant expiratory wheezing but no significant prolongation of expiratory phase.  She was given one dose of prednisone and gentle IVFs with discharge home in stable condition.         On further discussion with Ms.Glantz, she stated that she felt okay for now, but she felt unsafe.  I asked her to clarify this, and she said that because of her forgetfulness, I'm afraid I'm gonna leave something on that I shouldn't.  Although she has a home health aide, she fel that she needed to be in a nursing home for closer monitoring.  Pt also states that she does not have family nearby that can provide support for her.      At this time, her sx do not seem to be consist with a mast cell disorder, and as noted in my note from 4/26 and multiple specialists, she does not meet criteria for a mast cell disorder at this time, though part of our work-up is pending.  I expressed concern that her current regimen, which as prescribed by outside specialists, may be causing some of her current sx; however, she does not want to try weaning her medications--I don't want to change anything right now b/c every time we have in the past, I always have  to go back up on the doses.  I explained that I could not prescribe IM benadryl, which she understood.  I also suggested that the pt go the ER if her sx are worsening and request an Allergy consult, so we could evaluate her during an episode and adjust her medications accordingly; however, the pt declined this as well, saying, I already tried that and nothing happened.  Note that I do not see documentation of this in the ER notes.  She is also concerned about the cost of getting to and from the ER.    Of note, the pt has h/o asthma and has been on maintenance inhalers in the past; however, she stated my asthma was well-controlled but I've been having to use my rescue a lot.  She stated that  her medrol dose pack helped her somewhat, but she still has occasional SoB.  She has also c/o intermittent hives--I was supposed to start Xolair some years back, but I was afraid to try it.      Based on my conversation with Ms.Miano, her sx do not seem consistent with an underlying mast cell issue.  Her lightheadedness seems to be more reminiscent of a vasovagal or orthostatic issue, and her SoB could be related to worsening of asthma (she reports that the heat has made it harder for her to breathe) versus anxiety.  I also feel that she needs a great amount of assistance with her mental health, as she became tearful on the phone about living alone, lack of support, and anxiety.  My plan for her is as follows--    --Asked pt to bring in urine studies  --Explained to pt that tryptase during last episode in clinic was normal  --Will prescribe slower prednisone course: 40mg  x3days followed by 30mg  x3days followed by 20mg  x3days followed by 10mg  x3days  --She has an appt with Allergy on 5/31, at which time we will once again discuss her current medication regimen  --Order already placed for updated PFTs  --Encouraged pt to go to ER for worsening of sx so Allergy can potentially evaluate in-person during acute episode  --Pt to see PCP today    Pt expressed understanding of the above and appreciated phone call.

## 2016-06-03 NOTE — Unmapped (Signed)
SW received call from [place] with [facility] reporting patient received waiver and is getting 8 hrs nonskilled aid/week.  Helmut Muster reporting transportation issue resolved now that patient has waiver.    Paulette Blanch, MSSA, LISW  [phone]

## 2016-06-03 NOTE — Telephone Encounter (Signed)
Patient is managed by Dr. Fayrene Fearing. Will defer to her.

## 2016-06-04 ENCOUNTER — Inpatient Hospital Stay: Admit: 2016-06-04 | Discharge: 2016-06-04 | Disposition: A | Discharge status: Home / Self Care | Payer: MEDICARE

## 2016-06-04 DIAGNOSIS — R42 Dizziness and giddiness: Secondary | ICD-10-CM

## 2016-06-04 LAB — BASIC METABOLIC PANEL
Anion Gap: 11 mmol/L (ref 3–16)
BUN: 13 mg/dL (ref 7–25)
CO2: 23 mmol/L (ref 21–33)
Calcium: 8.4 mg/dL (ref 8.6–10.3)
Chloride: 107 mmol/L (ref 98–110)
Creatinine: 0.81 mg/dL (ref 0.60–1.30)
Glucose: 150 mg/dL (ref 70–100)
Osmolality, Calculated: 295 mOsm/kg (ref 278–305)
Potassium: 3.7 mmol/L (ref 3.5–5.3)
Sodium: 141 mmol/L (ref 133–146)
eGFR AA CKD-EPI: >90 See note.
eGFR NONAA CKD-EPI: 82 See note.

## 2016-06-04 LAB — CBC
Hematocrit: 43.7 % (ref 35.0–45.0)
Hemoglobin: 13.9 g/dL (ref 11.7–15.5)
MCH: 28.1 pg (ref 27.0–33.0)
MCHC: 31.7 g/dL — ABNORMAL LOW (ref 32.0–36.0)
MCV: 88.6 fL (ref 80.0–100.0)
MPV: 8.3 fL (ref 7.5–11.5)
Platelets: 310 10E3/uL (ref 140–400)
RBC: 4.93 10E6/uL (ref 3.80–5.10)
RDW: 14.8 % (ref 11.0–15.0)
WBC: 8.6 10E3/uL (ref 3.8–10.8)

## 2016-06-04 LAB — DIFFERENTIAL
Basophils Absolute: 17 /uL (ref 0–200)
Basophils Relative: 0.2 % (ref 0.0–1.0)
Eosinophils Absolute: 0 /uL — ABNORMAL LOW (ref 15–500)
Eosinophils Relative: 0 % (ref 0.0–8.0)
Lymphocytes Absolute: 903 /uL (ref 850–3900)
Lymphocytes Relative: 10.5 % — ABNORMAL LOW (ref 15.0–45.0)
Monocytes Absolute: 310 /uL (ref 200–950)
Monocytes Relative: 3.6 % (ref 0.0–12.0)
Neutrophils Absolute: 7370 /uL (ref 1500–7800)
Neutrophils Relative: 85.7 % — ABNORMAL HIGH (ref 40.0–80.0)
nRBC: 0 /100{WBCs} (ref 0–0)

## 2016-06-04 LAB — POC GLU MONITORING DEVICE: POC Glucose Monitoring Device: 133 mg/dL — ABNORMAL HIGH (ref 70–100)

## 2016-06-04 NOTE — Unmapped (Signed)
ED Attending Attestation Note    Date of service:  06/04/2016    This patient was seen by the resident physician.  I have seen and examined the patient, agree with the workup, evaluation, management and diagnosis. The care plan has been discussed and I concur.  I have reviewed the ECG and concur with the resident's interpretation.    My assessment reveals a 56 y.o. female presents with lightheadedness.  Patient with no focal neurological signs or symptoms.  No pain at this time.  She'll undergo workup and further evaluation.

## 2016-06-04 NOTE — Unmapped (Signed)
Pt with long time history of allergic reactions to multiple substances. Was seen here twice last week for same. Pt only reporting symptoms that have been going on for a length of time nothing acute. Pt saw PCP on Wednesday and still having symptoms, requesting consult to allergy and immunology and admit

## 2016-06-04 NOTE — Unmapped (Signed)
Faxed  to number given. Confirmation received, copy filed. LISA STARKEY5/22/20188:02 AM      ]

## 2016-06-04 NOTE — Unmapped (Signed)
[facility] ED Note    Date of Service: 06/04/2016      Reason for Visit: Dizziness      Patient History     HPI:  Emily Bonilla is a 56 y.o. 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  These episodes occur when she goes from sitting to standing and are alleviated by sitting.  She endorses scotomata and a lightheaded feeling and does not have any vertiginous symptoms.  Denies any chest pain or shortness of breath however she did state she was wheezing a little bit at 4 AM this morning which spontaneously resolved.  She also endorses a mental fog which she seems to endorse with these episodes in the past and says are consistent saying that she was unable to take out the trash today because she had no .  She was brought in by EMS and on the way received 50 of IV Benadryl as well as IV fluids per her request.      With the exception of above, the patient denies any aggravating or alleviating factors as well as any other associated signs or symptoms.    History reviewed. No pertinent past medical history.    Past Surgical History:   Procedure Laterality Date   ??? CHOLECYSTECTOMY         Emily Bonilla  reports that she has never smoked. She has never used smokeless tobacco. She reports that she does not drink alcohol or use drugs.    Previous Medications    BUSPIRONE (BUSPAR) 5 MG TABLET    Take 1 tablet (5 mg total) by mouth 2 times a day.    CETIRIZINE (ZYRTEC) 10 MG TABLET    Take 60 mg by mouth.              CROMOLYN (GASTROCROM) 100 MG/5 ML SOLUTION    Take 10 ml by mouth 3 times daily    DIPHENHYDRAMINE (BENADRYL) 50 MG CAPSULE    Take by mouth every 4 hours.              EPINEPHRINE 1 MG/ML (1 ML) SOLN INJECTION    Inject into the muscle.    FEXOFENADINE (ALLEGRA) 180 MG TABLET    Take 180 mg by mouth.    FUROSEMIDE (LASIX) 20 MG TABLET    Take 1 tablet  (20 mg total) by mouth daily.    IMMUNE GLOBULIN,GAMMA,IGG, (IMMUNE GLOBULIN, HUMAN,, IGG, IV)    Inject 6 g subcutaneously.    IRON DEXTRAN COMPLEX (INFED) 100 MG/2 ML (50 MG/ML) SOLN INJECTION    Inject into the vein.    LEVALBUTEROL (XOPENEX) 1.25 MG/3 ML NEBULIZER SOLUTION    Inhale 3 mL (1.25 mg total) by nebulization every 6 hours as needed for Wheezing.    LEVOTHYROXINE (SYNTHROID, LEVOTHROID) 112 MCG TABLET    Take one tablet by mouth once daily    MONTELUKAST (SINGULAIR) 10 MG TABLET    Take 1 tablet (10 mg total) by mouth at bedtime.    OMEPRAZOLE (PRILOSEC) 40 MG CAPSULE    Take 40 mg by mouth.    ONABOTULINUMTOXINA (BOTOX COSMETIC) 50 UNIT SOLR    Inject into the muscle.    PREDNISONE (DELTASONE) 10 MG TABLET    Take 4 tablets (40 mg) by mouth once daily for 3 days, then take 3 tablets (30 mg) once  daily for 3 days, then take 2 tablets (20 mg) once daily for 3 days, then stop.    RANITIDINE (ZANTAC) 300 MG TABLET    Take 1 tablet (300 mg total) by mouth 3 times a day.       Allergies:   Allergies as of 06/04/2016 - Fully Reviewed 06/04/2016   Allergen Reaction Noted   ??? Acetaminophen Anaphylaxis 11/17/2015   ??? Aspirin Anaphylaxis 09/27/2015   ??? Blue dye Anaphylaxis 11/17/2015   ??? Epinephrine Anaphylaxis 11/25/2015   ??? Flecainide Shortness Of Breath 02/06/2016   ??? Gabapentin Anaphylaxis 11/17/2015   ??? Iodinated contrast- oral and iv dye Anaphylaxis 11/07/2015   ??? Isopropyl alcohol Anaphylaxis 11/07/2015   ??? Latex Anaphylaxis and Rash 09/27/2015   ??? Lidocaine Anaphylaxis 09/27/2015   ??? Nsaids (non-steroidal anti-inflammatory drug) Anaphylaxis 11/07/2015   ??? Onion Anaphylaxis 11/17/2015   ??? Other Anaphylaxis 09/27/2015   ??? Perfume Anaphylaxis 11/07/2015   ??? Pregabalin Anaphylaxis 09/27/2015   ??? Quetiapine Anaphylaxis 11/07/2015   ??? Red dye Anaphylaxis 11/07/2015   ??? Sulfur Anaphylaxis 11/25/2015   ??? Yellow dye Anaphylaxis 11/07/2015   ??? Doxepin Other (See Comments) 11/17/2015   ??? Duloxetine  11/17/2015   ???  Epi e-z pen  01/19/1996   ??? Hydroxyzine Itching 11/07/2015   ??? Levofloxacin Itching 11/07/2015   ??? Montelukast  11/07/2015   ??? Verapamil (bulk) Hives 02/06/2016   ??? Adhesive tape-silicones Itching, Rash, and Swelling 09/27/2015   ??? Trazodone Rash 11/07/2015       Nursing notes reviewed.    Review of Systems     ROS:  All systems were reviewed and are negative except as noted in the history of the present illness. Specifically the patient denies:    Gen: No recent weight loss. No Fever. No Fatigue   Eyes: No visual changes.  HEENT: No change in hearing or ear pain. No sore throat. No headache.   Neck: No neck pain or stiffness  Back: No back pain  Chest: No shortness of breath or cough  CV: No Chest pain or palpitations  ABD: No N/V/D. No abdominal pain  Musculoskeletal: No muscle or joint pain, tenderness or swelling  Skin: No rashes  Hematologic: No lymph node swelling  Neuro: No numbness, tingling, or weakness.   Psych: No depression or anxiety      Physical Exam     ED Triage Vitals [06/04/16 1120]   Vital Signs Group      Temp 98.5 ??F (36.9 ??C)      Temp Source Oral      Heart Rate 69      Heart Rate Source Monitor      Resp 12      SpO2 95 %      BP 126/70      MAP (mmHg)       BP Location Left arm      BP Method Automatic      Patient Position Sitting   SpO2 95 %   O2 Device None (Room air)       General:  Well appearing and resting comfortably. No acute distress.   Eyes:  PERRLA. EOMI. No discharge from eyes.   ENT:  No discharge from nose. OP clear. No tonsillar exudates.   Neck:  Supple without lymphadenopathy.   Pulmonary:   Non-labored breathing. Lungs CTAB.   Cardiac:  Regular rate and rhythm. No murmurs, rubs, or gallops.   Abdomen:  Soft. Non-tender. Non-distended. Bowel sounds present  and normoactive in all four quadrants.  Musculoskeletal:  No long bone deformity.  No CVA tenderness.   Vascular:  Extremities warm and perfused. Capillary refill <2seconds.   Skin:  No rash.   Neuro: Alert and oriented  x 4. CN II-XII intact. 5/5 strength in all 4 distal extremities. Sensation grossly intact to light touch. Speech and mentation normal.  Gait narrow and stable.   Extremities:  No peripheral edema.       Diagnostic Studies     Labs:    Please see electronic medical record for any tests performed in the ED     Radiology:    No tests were performed during this ED visit    EKG:      EKG Interpretation    Interpreted by emergency department physician    Rhythm: normal sinus   Rate: normal  Axis: normal  Ectopy: none  Conduction: QTC is 475 otherwise no conduction abnormality's  ST Segments: normal  T Waves: Diffusely flattened T waves  Q Waves: Q waves in 3    Clinical Impression: Nonspecific EKG    Emily Bonilla      Emergency Department Procedures         ED Course and MDM   Emily Bonilla is a 56 y.o. female who presented to the emergency department with Dizziness   with a history and presentation as described above in HPI. The patient was evaluated by myself and the ED Attending Physician, Dr. Cathrine Muster and Dr. Lanae Boast. All management and disposition plans were discussed and agreed upon.    Presented with normal vital signs.    I spoke with the patient's primary care provider about the patient's presentation.  He was agreeable to having her follow up as an outpatient.    No evidence of anaphylaxis or angioedema per her presentation.      This is a 56 y.o. yo female presenting with low risk pre-syncope. Initial vitals within normal limits. There is no reported history of chest pain, shortness of breath, or family history of sudden cardiac death. The patient does not have a cardiac history structural heart disease such as CAD, AS, or CHF . ECG demonstrated no conduction abnormalities or evidence of tamponade and there was no evidence of dysarrythmia on telemetry. CBC is within normal limits (Hct > 30) and there was no apparent site for blood loss on history. Therefore, the patient has no high risk factors for serious  outcomes.     In addition, BMP demonstrated no significant electrolyte abnormalities. We have a low concern for infection, as patient is afebrile with no obvious source on exam. PE is unlikely with presentation and there is no evidence of tachycardia and/or hypoxia. The patient does not have risk factors for AAA and no pulsatile mass was appreciated on physical exam. The history was not consistent with a SAH or stroke as there is no history of headache and no focal neurologic symptoms are present. Patient presentation is most consistent with orthostasis / vasovagal syncope.     Risks, benefits, and alternatives were discussed. At this time the patient has been deemed safe for discharge. My customary discharge instructions including strict return precautions for worsening or new symptoms have been communicated.    Impression     1. Lightheadedness           Plan     1. The patient is to be discharged home in stable/improved condition.  2. Workup, treatment and diagnosis were discussed with the  patient and/or family members; the patient agrees to the plan and all questions were addressed and answered.  3. The patient is instructed to return to the emergency department should her symptoms worsen or any concern she believes warrants acute physician evaluation      Lanell Matar, MD, PGY1  [facility]     Lanell Matar  Resident  06/04/16 [phone]

## 2016-06-04 NOTE — Progress Notes (Signed)
Received paperwork from [employer] regarding Physician Verification. Paperwork will be put in MD's mailbox today for completion and signing. Kristine Royal 06/04/2016 2:18 PM

## 2016-06-04 NOTE — Unmapped (Signed)
Patient called  Phone [phone] (home)     Patient calling for update on status of paperwork from [employer] .  Weekend was much better but today reacted to some food.

## 2016-06-04 NOTE — Unmapped (Signed)
You were seen for lightheadedness.  This may be related to standing up too quickly.  Please sit down if the symptoms occur.  Please continue to rest as you need to follow up with her primary care provider this week.    In addition, return if:  - You develop a fever (greater than 101 F)   - You have chest pain, shortness of breath, abdominal pain, or uncontrollable vomiting  - You are unable to eat or drink  - You pass out  - You have difficulty moving your arms or legs    - You have new numbness  - You have difficulty speaking or slurred speech  - Or you have any other concerns

## 2016-06-07 MED ORDER — nebulizer and compressor Devi
0 refills | Status: AC
Start: 2016-06-07 — End: 2016-06-17

## 2016-06-08 MED ORDER — ranitidine (ZANTAC) 300 MG tablet
300 | ORAL_TABLET | Freq: Three times a day (TID) | ORAL | 3 refills | Status: AC
Start: 2016-06-08 — End: 2016-07-22

## 2016-06-08 MED ORDER — cromolyn (GASTROCROM) 100 mg/5 mL solution
100 | Freq: Three times a day (TID) | ORAL | 0 refills | 24.00000 days | Status: AC
Start: 2016-06-08 — End: 2016-06-17

## 2016-06-08 NOTE — Telephone Encounter (Signed)
Judeth Cornfield called Nurse CM called   Asking for appointment for May 31    You are not here   She is calling patient to see if she can come another day

## 2016-06-08 NOTE — Unmapped (Signed)
Discharge from: [facility] ED on 06/04/16  Discharge to: Home  Communication within 7 days of discharge: 06/08/16 at 8:30 am  Medicine Reconciliation done: yes  Discharge summary reviewed: yes  Pending Test reviewed: NA  Education provided to: side effects of prednisone  Referrals scheduled: NA  Community services done        Current Outpatient Prescriptions   Medication Sig   ??? busPIRone Take 1 tablet (5 mg total) by mouth 2 times a day.   ??? cetirizine Take 60 mg by mouth.             ??? cromolyn Take 10 ml by mouth 3 times daily   ??? diphenhydrAMINE Take by mouth every 4 hours.             ??? EPINEPHrine Inject into the muscle.   ??? fexofenadine Take 180 mg by mouth.   ??? furosemide Take 1 tablet (20 mg total) by mouth daily.   ??? IMMUNE GLOBULIN,GAMMA,IGG, (IMMUNE GLOBULIN, HUMAN,, IGG, IV) Inject 6 g subcutaneously.   ??? iron dextran complex Inject into the vein.   ??? levalbuterol Inhale 3 mL (1.25 mg total) by nebulization every 6 hours as needed for Wheezing.   ??? levothyroxine Take one tablet by mouth once daily   ??? montelukast Take 1 tablet (10 mg total) by mouth at bedtime.   ??? nebulizer and compressor Use 1 ampule as directed every 4 hours. Indications: Please dispense 1 nebulizer unit   ??? omeprazole Take 40 mg by mouth.   ??? onabotulinumtoxinA Inject into the muscle.   ??? predniSONE Take 4 tablets (40 mg) by mouth once daily for 3 days, then take 3 tablets (30 mg) once daily for 3 days, then take 2 tablets (20 mg) once daily for 3 days, then stop.   ??? ranitidine Take 1 tablet (300 mg total) by mouth 3 times a day.     No current facility-administered medications for this visit.    56 y.o. 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  These episodes occur when she goes from sitting to standing and are alleviated by sitting.  She endorses scotomata and a  lightheaded feeling and does not have any vertiginous symptoms.  Denies any chest pain or shortness of breath however she did state she was wheezing a little bit at 4 AM this morning which spontaneously resolved.  She also endorses a mental fog which she seems to endorse with these episodes in the past and says are consistent saying that she was unable to take out the trash today because she had no .  She was brought in by EMS and on the way received 50 of IV Benadryl as well as IV fluids per her request. There is no reported history of chest pain, shortness of breath, or family history of sudden cardiac death. The patient does not have a cardiac history structural heart disease such as CAD, AS, or CHF . ECG demonstrated no conduction abnormalities or evidence of tamponade and there was no evidence of dysarrythmia on telemetry. CBC is within normal limits (Hct > 30) and there was no apparent site for blood loss on history. Therefore, the patient has no high risk factors for serious outcomes.   ??  In addition, BMP demonstrated no significant electrolyte abnormalities. We have a low concern for infection, as patient is afebrile  with no obvious source on exam. PE is unlikely with presentation and there is no evidence of tachycardia and/or hypoxia. The patient does not have risk factors for AAA and no pulsatile mass was appreciated on physical exam. The history was not consistent with a SAH or stroke as there is no history of headache and no focal neurologic symptoms are present. Patient presentation is most consistent with orthostasis / vasovagal syncope.     RN spoke with the patient who states she is feeling better. She states she continues to have some diarrhea and is eating a bland diet. She states she had only one pre syncope episode and that was Saturday evening after an episode of diarrhea. She does mention she developed a carbuncle behind the ear which she lanced. She states it is red and warm but she does not  have a fever. The patient states she does not have transportation to see MD prior to 06/17/16, RN will try to make an appt for that date. Will discuss carbuncle with MD as well as needing an order for a nebulizer. RN unable to get patient in to see MD on 5/31. RN will continue to call patient throughout the week to ensure she continues to improve. RN also sent message to the patient's pcp regarding her ED visit and the site near the ear as well as her need for a nebulizer.    Rebecka Apley, BSN, Administrator, [facility]   [phone]    ??  ??

## 2016-06-10 NOTE — Unmapped (Signed)
RN followed up with the patient to assess how she is doing. The patient states the carbuncle has not progressed and seems to be healing, very little drainage and no pain or fever. She states she is feeling ok,had two brief episodes of dizziness yesterday after her skin flared up in shower while receiving assistance from her aide in bathing. RN did not hear back from pcp yet regarding nebulizer, will f/up with patient and MD tomorrow.       Rebecka Apley, BSN, Administrator, [facility]   [phone]

## 2016-06-17 ENCOUNTER — Other Ambulatory Visit: Admit: 2016-06-17 | Payer: MEDICARE

## 2016-06-17 ENCOUNTER — Ambulatory Visit: Admit: 2016-06-17 | Payer: MEDICARE

## 2016-06-17 DIAGNOSIS — J383 Other diseases of vocal cords: Secondary | ICD-10-CM

## 2016-06-17 LAB — 5 HIAA, QUANTITATIVE, URINE, 24 HOUR
5-HIAA, 24H Ur: 3.4 mg/24 hr (ref 0.0–14.9)
5-HIAA, 24H Urine: 2.5 mg/L

## 2016-06-17 LAB — METANEPHRINES, URINE, 24 HOUR
Metanephrine, Urine: 83 ug/L
Metanephrines, 24H Ur: 114 ug/(24.h) (ref 45–290)
Normetanephrine, 24H Ur: 318 ug/(24.h) (ref 82–500)
Normetanephrine, Ur: 231 ug/L

## 2016-06-17 MED ORDER — fexofenadine (ALLEGRA) 180 MG tablet
180 | ORAL_TABLET | Freq: Every day | ORAL | 3 refills | Status: AC
Start: 2016-06-17 — End: 2016-09-16
  Filled 2016-06-18: qty 30, 30d supply, fill #0

## 2016-06-17 MED ORDER — cromolyn (GASTROCROM) 100 mg/5 mL solution
100 | Freq: Three times a day (TID) | ORAL | 3 refills | 24.00000 days | Status: AC
Start: 2016-06-17 — End: 2016-07-13

## 2016-06-17 MED ORDER — nebulizer and compressor Devi
0 refills | Status: AC
Start: 2016-06-17 — End: 2016-09-16

## 2016-06-17 MED ORDER — diphenhydrAMINE (BENADRYL) 50 MG capsule
50 | ORAL_CAPSULE | Freq: Three times a day (TID) | ORAL | 2 refills | Status: AC | PRN
Start: 2016-06-17 — End: 2016-07-17

## 2016-06-17 MED ORDER — cetirizine (ZYRTEC) 10 MG tablet
10 | ORAL_TABLET | Freq: Three times a day (TID) | ORAL | 3 refills | Status: AC
Start: 2016-06-17 — End: 2016-07-17

## 2016-06-17 MED ORDER — triamcinolone (KENALOG) 0.1 % ointment
0.1 | Freq: Two times a day (BID) | TOPICAL | 3 refills | 1.00000 days | Status: AC
Start: 2016-06-17 — End: 2016-09-16
  Filled 2016-06-17: qty 30, 30d supply, fill #0

## 2016-06-17 NOTE — Unmapped (Signed)
I saw and examined the patient.  I discussed with the resident or fellow and agree with Emily ROCKWELL STEVENSON, Emily Bonilla's findings and plan as documented in the note.    HPI: 56 yr old female with complicated PMH with concern for mastocytosis here for 2 month follow-up. 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 After a detailed medical history at last visit it was determined that the pt. did not fulfill the criteria for a diagnosis of systemic mastocytosis. Further evaluation is warranted therefore labs were ordered including serum tryptase, 24hr 5hiaa, metanephrines, norepinephrine, and immunoglobulins (r/o carcinoid syndrome, pheochromocytoma). Urine pending. Serum tryptase during acute attack was normal. Bone marrow biopsies normal. Allergy eval by [facility] back in 1998: Physicians explained to pt that she did not have pathologic mastocytosis based on available evidence.  Felt that constellation of diagnoses (food and preservative allergies, asthma, VCD) could masquerade as mastocytosis--recommended no further evaluation for mastocytosis. In addition to above referred to Psychiatry for management of her anxiety (history of being diagnosed with narcissistic personality disorder).  Keeps going back to the ER for IV Benadryl complaining of mastocytosis symptoms despite normal physical examine. She describes her sx as the following--flushing, foginess, I forget things a lot, I get short of breath.  She has been using the following medications for her episodes: Cetirizine 10mg  six times daily, Fexofenadine 180mg  daily, Montelukast 10mg  QHS, Benadryl 50mg  QID, Cromolyn 100mg /78mL (10mL TID), Also has Epi-pen but has not had to use      Physical Exam:  GENERAL APPEARANCE:  Well developed, well nourished, in no acute distress.  SKIN: Inspection of the skin reveals no rashes, ulcerations or petechiae.  HEENT: normal turbinates, no evidence of swelling or rhinorrhea   NECK: Supple and symmetric.   LUNGS:   Auscultation of the lungs revealed normal breath sounds without any other adventitious sounds or rubs.  CARDIOVASCULAR: There was a regular rate and rhythm without any murmurs, gallops, rubs.   ABDOMEN: Soft and nontender with normal bowel sounds  LYMPH NODES:  No lymphadenopathy  EXTREMITIES:  No cyanosis, clubbing or edema.  NEUROLOGIC: Alert and oriented x 3    A&P: After a comprehensive review of medical history, previous tests/bone marrow biopsies, and serum tryptase during an acute attack there is no evidence of mastocytosis. Had a long conversation with the patient regarding these findings. Also had a discussion about what idiopathic mast cell activation syndrome (IMCAS) is. Main complaints are dizziness, abdominal cramps, and diarrhea for acute flares, no evidence of flushing and no flares consistent with anaphylaxis. This condition does produce elevations of tryptase in the serum or elevations of mast cell mediators in the urine following episodes of symptoms which is not consistent with the patients presentation. No history of [place] or POTS.    Dizziness and neurologic complaints could be secondary to side effects of high dose antihistamine regimen. Let start with wean of Benadryl, reduce to three time a day x 1 week, then two times a day x1 week, then once daily at night only. Continue on other antihistamines. Would like documentation of hives/angioedema/flushing breakthrough reactions during wean of Benadryl. Instructed patient to send pictures, consider skin biopsy with acute flare. If confirmed hives/angiodema will consider starting on Xolair treatment to allow patient to wean antihistamines.     Psychiatry  - Has appointment at [facility] on June 7th     Asthma/VCD  - Needs repeat PFT, currently on albuterol PRN.   - Referred to speech for VCD  workup.     Contact Rash  - topical triamcinolone 0.1% ointment BID PRN  - consider patch testing for metals      Emily Redmon Weston Anna, MD

## 2016-06-17 NOTE — Unmapped (Signed)
Allergy/Immunology Follow-up Note    Cc:  Chief Complaint   Patient presents with   ??? Follow-up     Follow-up regarding previous work-up for mast cell disease   ??? Rash   ??? Urticaria   ??? Asthma   ??? Dizziness     Likely related to vocal cord dysfunction     Date of last visit: 05/13/2016    HPI: Emily Bonilla is a 56 y.o. with complex past medical history who presents for follow-up regarding previous diagnosis of mastocytosis.  As explained in previous note, pt's extensive evaluation at outside hospitals did not fulfill criteria for mastocytosis, and tryptase obtained during last visit, as pt complained of acute episode, was negative, which also made idiopathic mast cell activation syndrome less likely.  Since last visit, pt visited the ED three times for episodes that she attributed to mast cell flare-up (please see telephone note from 06/02/2016 for details regarding two ED visits).  For initial sx, she described acute SoB, wheezing, and stridor--she was given IV benadryl by EMS each time, and by the time she arrived in ER, pt was stable.  ER physicians felt that pt was likely have vasovagal versus orthostatic events.  Pt was prescribed longer prednisone taper after second visit, as pt had stated on the phone that she seemed to be having increase in wheezing (had previous h/o asthma, currently only on albuterol, has not had PFTs done).  Pt states that sx improved on prednisone, including a rash on lower extremity that had been present after having titanium place in my leg in Fall 2017--I fell and broke my leg.  She had also been taking regimen previously prescribed for mast cell concern:  Cetirizine 10mg  six times daily  Fexofenadine 180mg  daily  Montelukast 10mg  QHS  Benadryl 50mg  QID  Cromolyn 100mg /42mL (10mL TID)  Since stopping prednisone a few days ago, rash began to come back but not nearly as bad.  Pt also says that her anxiety is quite high, but she [facility].      The patient's allergies, past medical history, past surgical history, family history, and social history were reviewed and updated.      Medications:  Current Outpatient Prescriptions on File Prior to Visit   Medication Sig Dispense Refill   ??? busPIRone (BUSPAR) 5 MG tablet Take 1 tablet (5 mg total) by mouth 2 times a day. 60 tablet 1   ??? cetirizine (ZYRTEC) 10 MG tablet Take 60 mg by mouth.               ??? cromolyn (GASTROCROM) 100 mg/5 mL solution Take 10 mLs (200 mg total) by mouth 3 times a day. 960 mL 0   ??? diphenhydrAMINE (BENADRYL) 50 MG capsule Take by mouth every 4 hours.               ??? EPINEPHrine 1 mg/mL (1 mL) Soln injection Inject into the muscle.     ??? fexofenadine (ALLEGRA) 180 MG tablet Take 180 mg by mouth.     ??? furosemide (LASIX) 20 MG tablet Take 1 tablet (20 mg total) by mouth daily. 180 tablet 1   ??? IMMUNE GLOBULIN,GAMMA,IGG, (IMMUNE GLOBULIN, HUMAN,, IGG, IV) Inject 6 g subcutaneously.     ??? iron dextran complex (INFED) 100 mg/2 mL (50 mg/mL) Soln injection Inject into the vein.     ??? levalbuterol (XOPENEX) 1.25 mg/3 mL nebulizer solution Inhale 3 mL (1.25 mg total) by nebulization every  6 hours as needed for Wheezing. 72 mL 0   ??? levothyroxine (SYNTHROID, LEVOTHROID) 112 MCG tablet Take one tablet by mouth once daily 90 tablet 3   ??? montelukast (SINGULAIR) 10 mg tablet Take 1 tablet (10 mg total) by mouth at bedtime. 90 tablet 1   ??? nebulizer and compressor Devi Use 1 ampule as directed every 4 hours. Indications: Please dispense 1 nebulizer unit 1 each 0   ??? omeprazole (PRILOSEC) 40 MG capsule Take 40 mg by mouth.     ??? onabotulinumtoxinA (BOTOX COSMETIC) 50 unit SolR Inject into the muscle.     ??? predniSONE (DELTASONE) 10 MG tablet Take 4 tablets (40 mg) by mouth once daily for 3 days, then take 3 tablets (30 mg) once daily for 3 days, then take 2 tablets (20 mg) once daily for 3 days, then stop. 27 tablet 0   ??? ranitidine (ZANTAC) 300 MG tablet Take 1 tablet (300 mg total) by mouth 3  times a day. 90 tablet 3     No current facility-administered medications on file prior to visit.              Review of Systems:    Const.:      [x]  NL  []  Weight change  []  Fever  []  Other:    Skin:        []  NL  [x]  Itching  [x]  Rash  []  Other:    Head/Eyes:   [x]  NL  []  Headache  []  Change in vision  []  Other:    ENT/Mouth:   [x]  NL  []  Change in hearing  []  Dental problems  []  Other:    Resp.:       []  NL  []  Cough  [x]  Difficulty breathing  []  Other:    CV:          [x]  NL  []  Chest pain  []  Leg swelling  []  Other:    GI:          []  NL  []  Nausea/vomiting  [x]  Diarrhea  []  Other:    GU:          [x]  NL  []  Frequent urination  []  Urinary infection  []  Other:    MS:          []  NL  []  Muscle weakness  [x]  Joint pain  []  Other:    Heme/Lymph: [x]  NL  []  Easy bruising  []  Blood transfusion  []  Other:    Endo:        [x]  NL  []  Heat/cold intol.  []  Change in appetite  []  Other:    Neuro:       [x]  NL  []  Fainting  []  Seizures  []  Other:_    Psych:       []  NL  [x]  Anxiety  [x]  Depression  []  Other:_    All/Rheum:   []  NL  [x]  See ACT/Rhinitis []  Joint stiffness  []  Other:          Physical Exam:   Vitals:    06/17/16 0919   BP: 130/63   Pulse: 80   Resp: 16   Temp: 98.2 ??F (36.8 ??C)     PF Readings from Last 3 Encounters:   No data found for PF               System/Body Area  /  Normal/Negative     /  Positive Findings    Gen.  Distress     [x]  None significant     []  Present                   Habitus      [x]  Well Nourished       []  Overweight  []  Underweight    Skin        Turgor       [x]  Normal               []  Abnormal:                   Rash         []  None                 [x]  Present: erythamatous rash with small papules and scabbing over (from scratching) noted on L medial shin    Eyes       Sclera       [x]  Non-injected         []  Injected                                [x]  No watering          []  Watering                    Pupils       [x]  Equal                []  Not equal                                 [x]  Reactive             []  Not reactive    ENT/OP  Mucosa       [x]  Non-edematous       []  Boggy  []  Edematous                                                           []  Erythematous                    Discharge   [x]  None                 []  Watery  []  Mucoid                    Septum       [x]  No perforation       []  Perforation                    Polyps       [x]  None                 []  Present                    Oropharynx [x]  No edema, thrush    []  Cobblestoning  []  Thrush    Neck        ROM          [x]  Neck supple          []  Neck not  supple                    Thyroid      []  No enlargement       []  Thyroid enlargement    Resp.      Effort       [x]  No resp distress     []  Resp distress                    Ausc.        [x]  Lungs Clear          []  Wheezing  []  Crackles    CV           Rhythm       [x]  Regular rhythm       []  Irregular rhythm                    Sounds       [x]  No M/R/G             []  Murmurs, rubs, gallops                    Edema        []  No edema             []  Edema    GI          Bowel Sound [x]  Normal               []  Hypoactive  []  Hyperactive                     Abd Palp     [x]  No tenderness        []  Tenderness    GU           CVA          []  No tenderness        []  Tenderness                     Bladder      []  Not distended        []  Distended    MS           Joints       []  No tenderness        []  Tenderness                     Muscles    []  No tenderness        []  Tenderness    Lymph      Neck         [x]  No lad    []  Lymphadenopathy                     Axillae      []  No lad    []  Lymphadenopathy    Neuro       Mental Stat [x]  A/O x3              []  Not oriented x3                     Sensation   [x]  Grossly normal       []  Sensation not normal      Labs/Imaging:  Tryptase normal  Immunoglobulins normal  Urine studies pending    Assessment/Plan:    Emily Lasso  Bonilla is a 56 y.o. with complex medical history who presents for follow-up visit.    As stated in last note and in  telephone call note, comprehensive review of medical work-up and current work-up is negative for mastocytosis.  Discussed idiopathic mast cell activation syndrome, though pt also does not fulfill criteria for this diagnosis (no evidence of elevated tryptase during acute episode, sx are not consistent with anaphylaxis--has had dizziness, abdominal cramping, diarrhea, SoB, flushing).  Documentation from past history or ER visits are negative for urticaria, angioedema, throat closing, GI sx, or CV sx.         Pt's main sx of dizziness and other neurologic complaints could be result of side-effects from such high doses of antihistamine regimen, as well as underlying anxiety.  As pt has h/o VCD, dizziness and acute SoB/wheezing episodes brought on by perfumes and smells more likely related to this diagnosis rather than mast cell disorder.    In regards to rash on leg, pt may have allergic contact dermatitis secondary to metal used for orthopedic surgery (timing and presentation is most consistent with this).      --Has appt with Psychiatry on 06/24/2016  --Order placed to Speech Pathology for VCD evaluation.  PFTs also ordered.  --Urine studies from initial mastocytosis eval in progress  --Will wean antihistamine regimen to see if this will improve dizziness.  Will start with benadryl 50mg  TID x1 week, 50mg  BID x1week, then 50mg  at night.  Continue cetirizine 10mg  TID (pt only increased to 60mg  during acute flares), Cromolyn 10mL TID, fexofenadine 180mg  daily, montelukast 10mg  QHS.    --Pt advised that if she has flare-up with hives when weaning benadryl to take pictures, as she could be candidate for Xolair.  --Triamcinolone 0.1% ointment BID PRN for current contact rash.  Consider patch testing but will need to check if materials available.     The patient was seen and discussed with Dr.Greiwe    RTC 2-3 months

## 2016-06-17 NOTE — Unmapped (Signed)
Please use triamcinolone ointment twice daily on rash on leg.    Please wean benadryl as follows:  50mg  three times a day for one week  50mg  two times a day for one week  50mg  each night onward    You will be scheduled for pulmonary function tests and a vocal cord dysfunction evaluation.    Follow-up: 2 months

## 2016-06-17 NOTE — Telephone Encounter (Signed)
Pt called back stating she had an appointment with Allergy today and they didn't address her dermatitis nor did they send a Rx for a nebulizer machine to her pharmacy     Informed pt the Rx was completed pt requesting it go to [facility];s the Rx was sent to [facility]; nurse will call and see if Rx can be transferred to [facility]     Regarding pt's other concerns please see allergy office note from 06/17/16 assessment by Md. Fayrene Fearing       Thank you    Assessment/Plan:    Emily Bonilla is a 56 y.o. with complex medical history who presents for follow-up visit.    As stated in last note and in telephone call note, comprehensive review of medical work-up and current work-up is negative for mastocytosis.  Discussed idiopathic mast cell activation syndrome, though pt also does not fulfill criteria for this diagnosis (no evidence of elevated tryptase during acute episode, sx are not consistent with anaphylaxis--has had dizziness, abdominal cramping, diarrhea, SoB, flushing).  Documentation from past history or ER visits are negative for urticaria, angioedema, throat closing, GI sx, or CV sx.         Pt's main sx of dizziness and other neurologic complaints could be result of side-effects from such high doses of antihistamine regimen, as well as underlying anxiety.  As pt has h/o           The patient was seen and discussed with Dr.Greiwe

## 2016-06-17 NOTE — Unmapped (Signed)
Patient called  Phone [phone] (home)     What is the plan for the allergic dermatitis on left leg and what about the syncopal episodes.    What is the game plan ?    Would like a future appointment on the books?  Said they could not schedule her when she was leaving ?   Said she did not get refills.

## 2016-06-17 NOTE — Unmapped (Signed)
Reviewed AVS with patient. Scheduled follow up appt. Placed on recall list . Given orders for labs and xrays. Questions invited and answered. Patient verbalized understanding.  Future Appointments  Date Time Provider Department Center   06/24/2016 1:15 PM Kalman Drape, MD [facility] OP   07/05/2016 2:10 PM Loleta Books, MD [facility]   07/28/2016 9:30 AM Hershal Coria, MD [facility]OX

## 2016-06-18 NOTE — Unmapped (Signed)
[facility]PT Discharge paperwork recvd and placed on md's desk for completion. LISA [facility] 06/18/2016 12:50 PM        Faxed  to number given. Confirmation received, copy filed. LISA STARKEY6/4/20188:50 AM

## 2016-06-23 NOTE — Unmapped (Signed)
[facility]/PO dated 06/22/16 and F2F Encounter received and placed on MD's desk for review and completion. LISA [facility] 06/23/2016 10:58 AM        Faxed  to number given. Confirmation received, copy filed. LISA STARKEY6/15/201810:25 AM

## 2016-06-24 ENCOUNTER — Ambulatory Visit: Admit: 2016-06-24 | Discharge: 2016-06-24 | Payer: MEDICARE | Attending: Psychiatry

## 2016-06-24 ENCOUNTER — Inpatient Hospital Stay: Admit: 2016-06-24 | Payer: MEDICARE

## 2016-06-24 ENCOUNTER — Ambulatory Visit: Admit: 2016-06-24 | Discharge: 2016-06-24 | Payer: MEDICARE

## 2016-06-24 DIAGNOSIS — T8489XA Other specified complication of internal orthopedic prosthetic devices, implants and grafts, initial encounter: Secondary | ICD-10-CM

## 2016-06-24 DIAGNOSIS — M79605 Pain in left leg: Secondary | ICD-10-CM

## 2016-06-24 DIAGNOSIS — F4323 Adjustment disorder with mixed anxiety and depressed mood: Secondary | ICD-10-CM

## 2016-06-24 MED ORDER — busPIRone (BUSPAR) 5 MG tablet
5 | ORAL_TABLET | ORAL | 0 refills | Status: AC
Start: 2016-06-24 — End: 2016-07-22
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
## 2016-06-24 NOTE — Unmapped (Signed)
I performed a history and physical exam of the patient and discussed his management with the resident. I reviewed the resident???s note and agree with the documented findings and plan of care.
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
## 2016-06-24 NOTE — Unmapped (Signed)
This office note has been dictated.  See transcribed note for additional clinical documentation for this visit.     Review of systems: A complete review of systems was performed today per the intake form, which is recorded in EPIC.  This was personally reviewed by me and pertinent positives are noted above in the history.

## 2016-07-05 ENCOUNTER — Ambulatory Visit: Payer: MEDICARE

## 2016-07-05 NOTE — Unmapped (Signed)
-----   Message from Whitman Hero, LPN sent at 1/61/0960  3:03 PM EDT -----  Message left on voicemail at 1439 pm  Angelique Blonder [phone]  [place] with [facility] called and said that the Pt is being d/c'ed from [facility] due to the Pt is getting from another agency

## 2016-07-06 NOTE — Telephone Encounter (Signed)
Pt states she got a letter in the mail today from you dated 06/17/16    It is a Referral for therapy, she called scheduling and they did not know why she needed the appt    She says that she does not need physical therapy again, she says the Agency sent it as protocol    She wants to Thank You for all of your  Time and patience

## 2016-07-12 ENCOUNTER — Ambulatory Visit: Admit: 2016-07-12 | Discharge: 2016-07-12 | Payer: MEDICARE | Attending: Speech-Language Pathologist

## 2016-07-12 ENCOUNTER — Inpatient Hospital Stay: Admit: 2016-07-12 | Discharge: 2016-07-22 | Payer: MEDICARE

## 2016-07-12 DIAGNOSIS — R490 Dysphonia: Secondary | ICD-10-CM

## 2016-07-12 DIAGNOSIS — Z8709 Personal history of other diseases of the respiratory system: Secondary | ICD-10-CM

## 2016-07-12 LAB — PFT13-PULMONARY FUNCTION TEST
DLCO%: 109
DLCO: 27.17
FEV1%: 105
FEV1/FVC EXP: 79
FEV1/FVC: 74
FEV1: 3.02
FVC%: 111
FVC: 4.1
RV%: 85
RV: 1.63
TLC%: 105
TLC: 5.86
VC%: 115
VC: 4.23

## 2016-07-12 NOTE — Unmapped (Signed)
CPT Coding: 16109 Laryngeal Exam: Rigid with stroboscopy     See below for stills from the video. Video can be viewed through endoportal in the quick links lab of intranet.   Pt was seen for a VLS to visualize the true vocal folds and laryngeal area for diagnosis and plan of care.    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 She underwent hypnoptherapy - can use this technique coupled with breathing exercises to eliminate VCD and MTD. Complains of severe inflammation and muscle tension throughout body - routinely gets botox injected base of skull and down spine for treatment. Pt has a handle on VCD but feels impaired on a daily basis by her voice. Complains of constant odynophonia and admits this is very much perpetuated by anxiety. Pt states her voice will get squeaky at times - tries not to talk throughout the day. Pt is disabled therefore voicing not required for work purposes but very much limited in social life. Pt on Zantac 300 mg 3x per day and trying to get Prilosec covered by insurance. Occasional dysphagia (cough with liquids, solid foods get stuck) but can manage with coping mechanisms and relaxation techniques. Dysphagia incidents range from 2-3x per day when she is anxious to no dysphagia incidents for months if she is feeling calm.    Surgical/medical history: Please see chart  Hydration: 60 ounce bottles per day  Smoking history: None  Caffeine intake: Coffee/Pepsi 20 ounces per day  Alcohol: None     Perceptual Quality: Pt presented with grossly normal vocal function with momentary pitch breaks and strain    Acoustic measurements  1. Maximum phonation time on /a/: 233 Hz for 15 seconds   2. S/Z ratio: .7 (Score >1 indicates glottal  insufficiency)  a. /s/: 7 sec  b. /z/:220 Hz for 10 sec    3. Pitch range: 165 Hz to 522 Hz    Videostroboscopy Ratings    1. Vocal fold edge- Left  a. smooth and edema questionable mild bowing    2. Vocal fold edge- Right  a. smooth, straight and edema    3. Glottic closure  anterior gap and posterior gap - resolution of anterior gap with increased breath support    4. Mucosal wave- Left (medial-lateral amplitude)  a. WFL    5. Mucosal wave- Right (medial-lateral amplitude)  a. WFL    6. Mucosal wave- Left (superior-inferior amplitude)  a. mildly decreased    7. Mucosal wave- Right (superior-inferior amplitude)         a.   mildly decreased         8. Phase symmetry  a. regular    9. Periodicity  a. WFL    10. Arytenoid movement  a. Equal movement of arytenoids bilaterally    11. Latero-medial compression  a. partial    12. Antero-posterior constriction  a. not present    13. Vertical level of approximation  a. equal    14. Interarytenoid tissue changes  a. not present    15. Post-cricoid space  a. normal    16. Oropharyngeal wall  a. normal    17. Endolaryngeal mucus  a. thick and sticky  18. Other  a. not applicable    ASSESSMENT AND PLAN OF CARE:   Ms. Abood presents with grossly normal vocal function with momentary pitch breaks and strain complaining of odynophonia and unreliable vocal quality secondary to vocal fold edema and muscle tension. Pt with previously diagnosed paradoxical vocal fold motion, currently managed with breathing strategies after hypnotherapy and relaxation techniques. Pt mainly symptomatic for odynophonia and muscle tension. Videostroboscopy today revealed bilateral TVF edema and questionable slight bowing on LTVF. Glottic closure characterized primarily by posterior gap and anterior gap at all pitches. Complete glottic closure attained with cues for increased breath support. Supraglottic compression noted during entirety of exam. In addition, thick mucus observed despite throat  clearing and sips of water. Subglottis was patent without evidence of narrowing. No mass lesions, paresis or paralysis was noted. Voice therapy recommended at this time to focus on optimizing laryngeal functioning with reduced tension and voicing techniques to minimize odynophonia. Pt already on medication and behavioral modifications for acid reflux. Pt was amenable and agreed to the plan of care.     RECOMMENDATIONS:     1.  Voice therapy to focus on optimizing laryngeal functioning with reduced tension and voicing techniques to minimize odynophonia.            Thank You,    Tommie Raymond, M.A., CF-SLP  Speech-Language Pathology Clinical Fellow   [facility]   Dept. of Otolaryngology - Head & Neck Surgery  [place], [place]    The fellow has directed the delivery of services while the qualified practitioner is present in the room or is responsible for the direction of the assessment and treatment.     Thank You,    Sharene Butters, M.A., CCC-SLP  Speech-Language Pathologist  [facility]   Dept. of Otolaryngology - Head & Neck Surgery  [place] [facility] [place], [place]

## 2016-07-14 MED ORDER — cromolyn (GASTROCROM) 100 mg/5 mL solution
100 | ORAL | 0 refills | 24.00000 days | Status: AC
Start: 2016-07-14 — End: 2016-08-07

## 2016-07-15 ENCOUNTER — Ambulatory Visit: Payer: MEDICARE

## 2016-07-19 NOTE — Unmapped (Addendum)
Patient calling stating that she has poison ivy on her left arm and right palm and as a result ended up having a near syncopal episode.  She denies any injury, but is requesting a prescription for medrol dose pack.  Patient can be reached at [phone].  Allergy and pharmacy verified

## 2016-07-22 ENCOUNTER — Ambulatory Visit: Admit: 2016-07-22 | Discharge: 2016-07-22 | Payer: MEDICARE

## 2016-07-22 ENCOUNTER — Inpatient Hospital Stay: Admit: 2016-07-22 | Discharge: 2016-07-28 | Payer: MEDICARE

## 2016-07-22 ENCOUNTER — Ambulatory Visit: Admit: 2016-07-22 | Discharge: 2016-07-22 | Payer: MEDICARE | Attending: Psychiatry

## 2016-07-22 DIAGNOSIS — F4322 Adjustment disorder with anxiety: Secondary | ICD-10-CM

## 2016-07-22 DIAGNOSIS — T8484XA Pain due to internal orthopedic prosthetic devices, implants and grafts, initial encounter: Secondary | ICD-10-CM

## 2016-07-22 DIAGNOSIS — Z09 Encounter for follow-up examination after completed treatment for conditions other than malignant neoplasm: Secondary | ICD-10-CM

## 2016-07-22 MED ORDER — busPIRone (BUSPAR) 10 MG tablet
10 | ORAL_TABLET | ORAL | 0 refills | Status: AC
Start: 2016-07-22 — End: 2016-08-26

## 2016-07-22 MED ORDER — ranitidine (ZANTAC) 300 MG tablet
300 | ORAL_TABLET | Freq: Three times a day (TID) | ORAL | 11 refills | Status: AC
Start: 2016-07-22 — End: 2016-09-16

## 2016-07-22 NOTE — Unmapped (Signed)
This office note has been dictated.

## 2016-07-22 NOTE — Telephone Encounter (Signed)
Pt is requesting a referral be sent for hypnotherapy and wanted provider to know that she has VCD and will be starting therapy for her vocal cords on Monday.

## 2016-07-22 NOTE — Unmapped (Signed)
The patient was sent in consultation by Dr. Christain Sacramento.  She has hardware in her left tibia that she would like removed because she has severe mast cell disease and has significant reaction to her retained metal.  Currently, her skin is in good condition.  Does not have significant dermatitis.  However, she requires extensive medication and medical management to keep her under control.    Physical exam: Her left medial calf wounds are fully healed with no dermatitis no erythema or no drainage.  She is minimally tender over the hardware.  She is a full range of motion of the left knee and ankle.  She has no instability.  Neurovascular exam LE - EHL, tibialis anterior and gastroc soleus complex are intact.  The light touch is intact dorsal, plantar, in the first web space, and palpable pulses in the dorsalis pedis and posterior tibia with cap refill brisk in all digits.    Radiographs: AP and lateral of her left tibia demonstrate a well-healed distal tibia fracture with intact hardware.  Assessment and plan: Given her extensive mast cell disease.  She needs to hardware removed.  The biggest concern is her soft tissue reaction and reaction to surgical intervention.  Removing the hardware.  I think we'll be the minimal component of her surgical intervention.  The patient and I have discussed the risks, benefits, and alternative procedures of operative versus nonoperative treatment.  I've given the patient the opportunity to ask questions.  I've answered all of her questions.  She understands the risks of the operation as well as the potential benefits.  I've asked her to sign an informed surgical consent which she has signed.  We will schedule her for surgery.
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
## 2016-07-23 MED ORDER — ciprofloxacin lactate (CIPRO) 400 mg in dextrose 5% in water (D5W) 200 mL IVPB
Freq: Two times a day (BID) | INTRAVENOUS | Status: AC
Start: 2016-07-23 — End: 2016-10-28

## 2016-07-26 ENCOUNTER — Ambulatory Visit: Admit: 2016-07-26 | Discharge: 2016-07-26 | Payer: MEDICARE | Attending: Speech-Language Pathologist

## 2016-07-26 DIAGNOSIS — R490 Dysphonia: Secondary | ICD-10-CM

## 2016-07-26 NOTE — Unmapped (Signed)
[facility] HH/HH cert and POC for 6/11 to 08/26/16 received and placed on MD's desk for review and completion. LISA [facility] 07/26/2016 2:29 PM        Faxed  to number given. Confirmation received, copy filed. LISA STARKEY7/10/20188:52 AM

## 2016-07-26 NOTE — Progress Notes (Signed)
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 She underwent hypnoptherapy - can use this technique coupled with breathing exercises to eliminate VCD and MTD. Complains of severe inflammation and muscle tension throughout body - routinely gets botox injected base of skull and down spine for treatment. Pt has a handle on VCD but feels impaired on a daily basis by her voice. Complains of constant odynophonia and admits this is very much perpetuated by anxiety. Pt states her voice will get squeaky at times - tries not to talk throughout the day. Pt is disabled therefore voicing not required for work purposes but very much limited in social life. Pt on Zantac 300 mg 3x per day and trying to get Prilosec covered by insurance. Occasional dysphagia (cough with liquids, solid foods get stuck) but can manage with coping mechanisms and relaxation techniques. Dysphagia incidents range from 2-3x per day when she is anxious to no dysphagia incidents for months if she is feeling calm.    [name] presents with grossly normal vocal function with momentary pitch breaks and strain complaining of odynophonia and unreliable vocal quality secondary to vocal fold edema and muscle tension. Pt with previously diagnosed paradoxical vocal fold motion, currently managed with breathing strategies after hypnotherapy and relaxation techniques. Pt mainly symptomatic for odynophonia and muscle tension. Videostroboscopy today revealed bilateral TVF edema and questionable slight bowing on LTVF. Glottic closure characterized primarily by posterior gap and anterior gap at all pitches.  Complete glottic closure attained with cues for increased breath support. Supraglottic compression noted during entirety of exam. In addition, thick mucus observed despite throat clearing and sips of water. Subglottis was patent without evidence of narrowing. No mass lesions, paresis or paralysis was noted. Voice therapy recommended at this time to focus on optimizing laryngeal functioning with reduced tension and voicing techniques to minimize odynophonia. Pt already on medication and behavioral modifications for acid reflux. Pt was amenable and agreed to the plan of care.     Subjective: Pt states she had a VCD attack this morning triggered by another individual's perfume. She was able to resolve episode with breathing technique and relaxation breathing.     Pt currently intakes:  Water - 64 + ounces per day  Caffeine - 8 ounces coffee per day    Objective:    Goal: Session #1: Session #2: Session #3: Session #4:   Pt will adhere to vocal hygiene protocol during daily life with 80% accuracy given mod cues Pt expressed understanding    Discussed importance of hydration       Pt will adhere to acid reflux management diet during daily life with 80% accuracy given moderate cues Pt expressed understanding     Currently adhering to anti-reflux diet. Pt very sensitive to foods/drink      Pt will perform abdominal breathing exercises at rest and during phonation with 80% accuracy and mod cues Pt performed abdominal breathing exercises:  -at rest with 65% accuracy and max cues  -during phonation with 60% accuracy and max cues      Pt will perform SOVT  exercises during various voicing exercises with 80% accuracy given mod cues Pt performed SOVT strawflow exercises during sustained phonation with 60% accuracy and mod cues      Pt will perform RVT exercises with forward-focused resonance, adequate airflow, and buzzy phonation with 80% accuracy and mod cues Pt performed RVT exercises with 65% accuracy and max cues    Hums,  hums + vowels, inflected vowels, and chanted m resonant sentences today      Pt will complete laryngeal/general relaxation stretches/exercises with 90% accuracy and no cues. Pt independent and utilizing        Assessment: Ms. Studer presents with grossly normal vocal function with momentary pitch breaks and strain complaining of odynophonia and unreliable vocal quality secondary to vocal fold edema and muscle tension. Videostroboscopy revealed bilateral TVF edema, questionable slight bowing on LTVF, and muscle tension. Initial session spent primarily on voice exercises as pt already adhering to vocal hygiene, anti-reflux, and laryngeal relaxation techniques. Semi-occluded vocal tract exercises very effective in improving vocal quality and eliminating odynophonia. I can't feel tension in my throat at all with these!. Prognosis is excellent with pt compliance. Pt to F/U next week.     Plan:   Follow HEP - Pt provided with handout for home practice  RTC in 1 week    Thank You,    Tommie Raymond, M.A., CF-SLP  Speech-Language Pathology Clinical Fellow   [facility]   Dept. of Otolaryngology - Head & Neck Surgery  [place] of [place], [place]    The fellow has directed the delivery of services while the qualified practitioner is present in the room or is responsible for the direction of the assessment and treatment.     Thank You,    Sharene Butters, M.A., CCC-SLP  Speech-Language Pathologist  [facility]   Dept. of Otolaryngology - Head & Neck Surgery  [place] of [place], [place]

## 2016-07-27 MED ORDER — methylPREDNISolone (MEDROL, PAK,) 4 mg tablet
4 | ORAL_TABLET | ORAL | 0 refills | Status: AC
Start: 2016-07-27 — End: 2016-07-28
  Filled 2016-07-27: qty 21, 6d supply, fill #0

## 2016-07-27 NOTE — Unmapped (Signed)
Patient called  Phone [phone] (home)     Pharmacy phone / location [facility]     Got poison ivy on left arm 1 week ago. 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  She is using topical cream and aerated sleeve.    She was pulling weeds because she is having surgery.  And did not want to look at it after surgery.  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

## 2016-07-28 ENCOUNTER — Ambulatory Visit: Admit: 2016-07-28 | Payer: MEDICARE | Attending: Clinical Cardiac Electrophysiology

## 2016-07-28 DIAGNOSIS — I456 Pre-excitation syndrome: Secondary | ICD-10-CM

## 2016-07-28 NOTE — Telephone Encounter (Signed)
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  Talked for a while about her allergy and poison ivy and then said that the doctor who ordered the medication then took it back  So she still does not have steroids for her poison ivy and allergy condition.       Called MD and medication fell off medication list , ok to call in per Dr Alpha Gula .   Notified patient and per patient request called into [facility] and called [facility] to verify they did not dispense     Spoke with Kendell Bane at [facility]

## 2016-07-28 NOTE — Unmapped (Signed)
Will complete once available.

## 2016-07-28 NOTE — Unmapped (Signed)
Paperwork received from [facility] for physician orders from 06/28/16 for Skilled nursing services x60 days.   Form placed in PCP's box/desk for completion and signature.

## 2016-07-28 NOTE — Unmapped (Signed)
Paperwork received from [facility] for Start of Care Patient's needs assessment.   Placed in PCP's box for signature/completion.

## 2016-07-28 NOTE — Unmapped (Signed)
Faxed  to number given. Confirmation received, copy filed. LISA STARKEY7/11/20182:43 PM

## 2016-07-28 NOTE — Unmapped (Signed)
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 Cardiac Electrophysiology clinic for assessment and management.     During the prior clinic visit, the patient had brought a stack of papers with detailed history. This included the documented episode of atrial fibrillation which upon review, seem to not be atrial fibrillation and instead, is likely to be a sinus tachycardia. Further, review of her ECG suggests little or no anterograde conduction of the putative WPW accessory pathway. Unfortunately, no records of the prior EP studies were available at the time of this visit.    Today, the patient states she feels much better. She had not had any signs or symptoms of arrhythmia.      Currently, the patient has no other complaints and is comfortable. The patient does not have nausea, vomiting, fevers, chills, chest pain, dyspnea with exertion, diaphoresis, orthopnea, paroxysmal nocturnal dyspnea, dizziness, presyncope, palpitations, fatigue, exercise intolerance, claudication, visual changes, neurological complaints, urinary symptoms, bowel changes, or edema.     PAST MEDICAL/SURGICAL HISTORY:  As listed above in the HPI.     FAMILY HISTORY:  There is no family history of sudden cardiac death.     SOCIAL HISTORY:   The patient does not have a history of tobacco use, significant alcohol use or illicit drug use.      REVIEW OF SYSTEMS:  A 12-point review of systems was conducted and was negative except for the pertinent positives and negatives listed above in the HPI.     PHYSICAL EXAM:  A comprehensive cardiovascular examination was performed.  GEN: Comfortable, anxious, NAD.   SKIN: No cyanosis or pallor.  LUNGS: Symmetric chest, clear to auscultation  bilaterally.  HEART: No JVD, regular rhythm, normal S1 and S2, no murmurs, rubs or gallops.   ABD: Soft, non-tender, non-distended, bowel sounds present.   EXT: No LE edema, dorsalis Pedis and radial pulses are full and equal bilaterally.   NEURO: Alert & oriented x 3, CN II-XII intact.     PRIOR ECG: Normal sinus rhythm, 67 bpm, no evidence of accessory pathway conduction at this rate.    ECG today: Normal sinus rhythm, 83 bpm, no evidence of accessory pathway conduction at this rate.     TEST RESULTS:  Prior test results and chart notes were reviewed in detail, including labs, radiology and ECGs, and pertinent positives and negatives were noted.     ASSESSMENT:  Emily Bonilla is a pleasant 56 year-old woman with a complex medical history, including psychiatric, allergic, mast cell, and thyroid issues, and a remote history of WPW and an episode of atrial fibrillation, who presents to the [facility] Cardiac Electrophysiology clinic who presents to the [facility] Cardiac Electrophysiology clinic for assessment and management.     The lack of any signs of the accessory pathway on review of her ECG at 67 bpm suggests little or no anterograde conduction of the putative WPW accessory pathway. Unfortunately, no records of the prior EP studies were available at the time of this visit. It is reasonable to avoid AV nodal blocking agents in this patient.    There is no evidence of cardiac rhythm disorders in this patient.      RECOMMENDATIONS:     1. There is no evidence available at the  time of this clinic visit that the patient had any episode of atrial fibrillation. If she did have an episode of atrial fibrillation during stress, and this episode has not recurred, no further treatment is necessary.     2. There is no indication for additional EP workup or treatment for WPW at this time. A concealed accessory pathway is possible. Recommend avoiding AV nodal blocking agents until documentation is available from the prior EP studies.      3. Agree with continued follow up with psych and allergy and other specialists.     4. Centralize care through primary care physician and psychiatry.    5. Should the patient develop any signs of arrhythmia or have any evidence of arrhythmia, we would be happy to see the patient in cardiac EP clinic for further evaluation and management.     Health Maintenance  -- The patient was advised to contact the Cardiac Electrophysiology team at the [facility] in case of worsening symptoms, including fever, chills, nausea, vomiting, persistent cough, diarrhea, decreased or increased urinary output, shortness of breath, chest pain, palpitations, headache, dizziness, weakness, loss of balance, persistent pain, visual changes, bleeding, or for any symptoms that are of concern to the patient.   -- For matters after hours or on weekends, the patient was advised to contact the senior Cardiology Fellow on Call.  -- If prompt contact cannot be established with a physician, or in case of an emergency, the patient should call 911 and go to the nearest emergency room (Crisis hotline is [phone] nationally).     DICTATED BY:  Hershal Coria, MD, PhD  Assistant Professor of Medicine (Cardiac Electrophysiology)  [facility]  [address] [place], [address] [place], [place] [address], [place]  Bethany [place], Environmental health practitioner  [phone] (F), [email or web address]

## 2016-07-28 NOTE — Unmapped (Signed)
Faxed  to number given. Confirmation received, copy filed. LISA STARKEY7/11/20182:49 PM

## 2016-07-28 NOTE — Unmapped (Signed)
Just FYI-Pt has 9:30 appt today. States that she may be 5 minutes late due to transportation.

## 2016-07-30 NOTE — Unmapped (Signed)
Received paperwork from [facility] regarding PO dated 07/07/16. Paperwork will be put in MD's mailbox today for completion and signing. Kristine Royal 07/30/2016 9:25 AM

## 2016-07-30 NOTE — Unmapped (Signed)
Faxed  to number given. Confirmation received, copy filed. LISA STARKEY7/16/20189:56 AM

## 2016-08-02 ENCOUNTER — Ambulatory Visit: Admit: 2016-08-02 | Discharge: 2016-08-02 | Payer: MEDICARE | Attending: Speech-Language Pathologist

## 2016-08-02 DIAGNOSIS — R490 Dysphonia: Secondary | ICD-10-CM

## 2016-08-02 NOTE — Unmapped (Signed)
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  Background History:     Pt with complex medical history. 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 She underwent hypnoptherapy - can use this technique coupled with breathing exercises to eliminate VCD and MTD. Complains of severe inflammation and muscle tension throughout body - routinely gets botox injected base of skull and down spine for treatment. Pt has a handle on VCD but feels impaired on a daily basis by her voice. Complains of constant odynophonia and admits this is very much perpetuated by anxiety. Pt states her voice will get squeaky at times - tries not to talk throughout the day. Pt is disabled therefore voicing not required for work purposes but very much limited in social life. Pt on Zantac 300 mg 3x per day and trying to get Prilosec covered by insurance. Occasional dysphagia (cough with liquids, solid foods get stuck) but can manage with coping mechanisms and relaxation techniques. Dysphagia incidents range from 2-3x per day when she is anxious to no dysphagia incidents for months if she is feeling calm.    Emily Bonilla presents with grossly normal vocal function with momentary pitch breaks and strain complaining of odynophonia and unreliable vocal quality secondary to vocal fold edema and muscle tension. Pt with previously diagnosed paradoxical vocal fold motion, currently managed with breathing strategies after hypnotherapy and relaxation techniques. Pt mainly symptomatic for odynophonia and muscle tension. Videostroboscopy today revealed bilateral TVF edema and questionable slight bowing on LTVF. Glottic closure characterized primarily by posterior gap and anterior gap at all pitches.  Complete glottic closure attained with cues for increased breath support. Supraglottic compression noted during entirety of exam. In addition, thick mucus observed despite throat clearing and sips of water. Subglottis was patent without evidence of narrowing. No mass lesions, paresis or paralysis was noted. Voice therapy recommended at this time to focus on optimizing laryngeal functioning with reduced tension and voicing techniques to minimize odynophonia. Pt already on medication and behavioral modifications for acid reflux. Pt was amenable and agreed to the plan of care.     Subjective: Pt states she has been practicing her exercises and they help her a lot. She doesn't notice tension in her throat if she focuses on her lips. She states she was having particular problems with the chanted sentences.   ??  Pt currently intakes:  Water - 64 + ounces per day  Caffeine - 8 ounces coffee per day    Objective:    Goal: Session #1: Session #2: Session #3: Session #4:   Pt will adhere to vocal hygiene protocol during daily life with 80% accuracy given mod cues Pt expressed understanding    Discussed importance of hydration  Addressed. Pt adhering with ease     Pt will adhere to acid reflux management diet during daily life with 80% accuracy given moderate cues Pt expressed understanding     Currently adhering to anti-reflux diet. Pt very sensitive to foods/drink Addressed. Pt adhering with ease     Pt will perform abdominal breathing exercises at rest and during phonation with 80% accuracy and mod cues Pt performed abdominal breathing exercises:  -at rest with 65% accuracy and  max cues  -during phonation with 60% accuracy and max cues Pt performed abdominal breathing exercises:    -during phonation with 70% accuracy and max cues     Pt will perform SOVT exercises during various voicing exercises with 80% accuracy given mod cues Pt performed SOVT strawflow exercises during sustained phonation with 60% accuracy and mod cues  Pt performed SOVT strawflow exercises during sustained phonation with 75% accuracy and mod cues    Air added before high pitches     Pt will perform RVT exercises with forward-focused resonance, adequate airflow, and buzzy phonation with 80% accuracy and mod cues Pt performed RVT exercises with 65% accuracy and max cues    Hums, hums + vowels, inflected vowels, and chanted m resonant sentences today Pt performed RVT exercises with 70% accuracy and max cues    Hums, hums + vowels, inflected vowels, and chanted m resonant sentences today    Cues to smack lips with /m/s     Pt will complete laryngeal/general relaxation stretches/exercises with 90% accuracy and no cues. Pt independent and utilizing Pt independent and utilizing       Assessment: Emily Bonilla presents with grossly normal vocal function with momentary pitch breaks and strain complaining of odynophonia and unreliable vocal quality secondary to vocal fold edema and muscle tension. Videostroboscopy revealed bilateral TVF edema, questionable slight bowing on LTVF, and muscle tension. Second therapy session continued in voice exercises. SOVT exercises altered for easier onset in higher pitches with addition of air before phonation. RVT shaped to include more buzz and dial back on breath. Pt extremely receptive and with wonderful progress. She continues to state the exercises help reduce laryngeal muscle tension. Prognosis is excellent with pt compliance. Pt to F/U next week.     Plan:   Follow HEP - Pt provided with handout for home practice  RTC in 1 week  ??  Thank You,    Tommie Raymond, M.A., CF-SLP  Speech-Language Pathology Clinical Fellow   [facility]   Dept. of Otolaryngology - Head & Neck Surgery  [place] of [place], [place]    The fellow has directed the delivery of services while the qualified practitioner is present in the room or is responsible for the direction of the assessment and treatment.     Thank You,    Sharene Butters, M.A., CCC-SLP  Speech-Language Pathologist  [facility]   Dept. of Otolaryngology - Head & Neck Surgery  [place] of [place], [place]

## 2016-08-03 NOTE — Unmapped (Signed)
Pharmacy wanted to verify directions on buspar prescription as direction don't match the way pt reports taking medication.  Please call.

## 2016-08-04 NOTE — Unmapped (Signed)
Pharmacy left another message that they would not dispense until directions are verified.  Called pharmacy, advised that last office visit says that she should increase 0.5 tabs every 7 days until a max dose of 40 mg per day.  Spoke to [name].

## 2016-08-05 ENCOUNTER — Ambulatory Visit: Admit: 2016-08-05 | Payer: MEDICARE

## 2016-08-05 DIAGNOSIS — T8484XA Pain due to internal orthopedic prosthetic devices, implants and grafts, initial encounter: Secondary | ICD-10-CM

## 2016-08-05 NOTE — Unmapped (Signed)
ANESTHESIOLOGY CONSULTATION AND PRE-OPERATIVE HISTORY AND PHYSICAL       Subjective:      [facility] Attending Physician: Orion Crook, MD  [facility] NP / PA:   Ivor Reining, CNP    Date of Surgery:  08/17/2016  Surgeon:  Dr. Cloyde Reams  Diagnosis:  painful orthopaedic hardware  Procedure:  Removal of hardware left tibia    Patient ID: Emily Bonilla is a 56 y.o. female.    Patient is being seen today at the request of Dr. Cloyde Reams to render an opinion on perioperative risk optimization and to coordinate medical care as necessary prior to the following procedure:  Removal of hardware left tibia.    Chief Complaint   Patient presents with   ??? Pre-op Exam     painful orthopaedic hardware       History of Present Illness: This is a 56 yo female with past medical history including anxiety, asthma, GERD< thyroid disease, Afib, WPW, and IMCAS.  She had a tibia fracture surgery last year and has retained hardware in her left tibia that is causing her pain, as well as a diffuse dermatitis that has improved with benadryl and oral and topical steroids.  She is not using any assistive devices for ambulation. While sitting for the exam today she rates her left lower leg pain at 5/10 described as achy and occasionally sharp in nature.  She has a pins and needles sensation in the lower leg intermittently since her surgery as well. Pain is worse with a lot of activity.  She is seen in pre-op today.       Chronic Medical Conditions, Severity, Optimization:  See below    Duke Activity Scale:  4 - Raking leaves; weeding or pushing a power mower.           Medical History:     Past Medical History:   Diagnosis Date   ??? Anaphylaxis    ??? Anemia    ??? Asthma    ??? Atrial fibrillation (CMS Dx)    ??? Bronchitis    ??? Heart disease    ??? Neurological disease    ??? OSA (obstructive sleep apnea)     states resolved   ??? Osteoporosis    ??? Spontaneous pneumothorax     states pneumomediatsium/pneumopericardium, self resolved   ??? Thyroid disease    ??? Ulcer  (CMS Dx)    ??? WPW (Wolff-Parkinson-White syndrome)        Surgical History:     Past Surgical History:   Procedure Laterality Date   ??? BONE MARROW BIOPSY      benign   ??? CARDIAC ELECTROPHYSIOLOGY MAPPING AND ABLATION      attempted x 2, states not successful either time   ??? CHOLECYSTECTOMY     ??? FACIAL COSMETIC SURGERY     ??? HYSTERECTOMY     ??? LIVER BIOPSY      benign hepatic adednoma per patient   ??? TIBIA FRACTURE SURGERY     ??? TONSILLECTOMY      + adenoids       Family History:     Family History   Problem Relation Age of Onset   ??? Kidney failure Mother    ??? Aneurysm Mother        Social History:     Social History     Social History   ??? Marital status: Divorced     Spouse name: N/A   ??? Number of children: N/A   ???  Years of education: N/A     Occupational History   ??? Not on file.     Social History Main Topics   ??? Smoking status: Never Smoker   ??? Smokeless tobacco: Never Used   ??? Alcohol use No   ??? Drug use: No   ??? Sexual activity: Not on file     Other Topics Concern   ??? Not on file     Social History Narrative   ??? No narrative on file       Allergies:     Allergies   Allergen Reactions   ??? Acetaminophen Anaphylaxis     Unknown   ??? Aspirin Anaphylaxis     Unknown   ??? Blue Dye Anaphylaxis     Unknown   ??? Epinephrine Anaphylaxis   ??? Flecainide Shortness Of Breath     Itching, decreased libido, wheezing, flushing, numbness.    ??? Gabapentin Anaphylaxis   ??? Iodinated Contrast- Oral And Iv Dye Anaphylaxis   ??? Isopropyl Alcohol Anaphylaxis   ??? Latex Anaphylaxis and Rash   ??? Lidocaine Anaphylaxis   ??? Nsaids (Non-Steroidal Anti-Inflammatory Drug) Anaphylaxis   ??? Onion Anaphylaxis   ??? 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   ???  Perfume Anaphylaxis   ??? Pregabalin Anaphylaxis   ??? Quetiapine Anaphylaxis     Mental Status Change   ??? Red Dye Anaphylaxis     Unknown   ??? Sulfur Anaphylaxis     Pt states: Any alcohol product   ??? Yellow Dye Anaphylaxis     Unknown   ??? Doxepin Other (See Comments)     Unknown   ??? Duloxetine      Other reaction(s): Other (See Comments)  Mental Status Change   ??? Epi E-Z Pen    ??? Hydroxyzine Itching     Unknown   ??? Levofloxacin Itching   ??? Montelukast    ??? Verapamil (Bulk) Hives     Insomnia, flushing, scattered brain, migraine, itching, anxiety.    ??? Adhesive Tape-Silicones Itching, Rash and Swelling     Unknown   ??? Trazodone Rash       Medications:     Prior to Admission medications taking for visit date 08/05/16   Medication Sig Taking? Authorizing Provider   diphenhydrAMINE (BENADRYL) 50 MG tablet Take 50 mg by mouth 3 times a day. Yes Historical Provider, MD   busPIRone (BUSPAR) 10 MG tablet Take 0.5 tablets (5 mg total) by mouth See Admin Instructions. Indications: Generalized Anxiety Disorder  Kalman Drape, MD   cromolyn (GASTROCROM) 100 mg/5 mL solution Take 10 ml by mouth 3 times daily  Court Joy, MD   EPINEPHrine 1 mg/mL (1 mL) Soln injection Inject into the muscle.  Historical Provider, MD   fexofenadine (ALLEGRA) 180 MG tablet Take 1 tablet (180 mg total) by mouth daily.  Aldean Jewett, MD   furosemide (LASIX) 20 MG tablet Take 1 tablet (20 mg total) by mouth daily.  Court Joy, MD   iron dextran complex (INFED) 100 mg/2 mL (50 mg/mL) Soln injection Inject into the vein.  Historical Provider, MD   levalbuterol (XOPENEX) 1.25 mg/3 mL nebulizer solution Inhale 3 mL (1.25 mg total) by nebulization every 6 hours  as needed for Wheezing.  Parks Ranger, MD   levothyroxine (SYNTHROID, LEVOTHROID) 112 MCG tablet Take one tablet by mouth once daily  Court Joy, MD   montelukast (SINGULAIR) 10 mg tablet Take 1 tablet (10 mg total) by mouth at bedtime.  Court Joy, MD   nebulizer and  compressor Devi Use 1 ampule as directed every 4 hours. Indications: Please dispense 1 nebulizer unit  Aldean Jewett, MD   omeprazole (PRILOSEC) 40 MG capsule Take 40 mg by mouth.  Historical Provider, MD   onabotulinumtoxinA (BOTOX COSMETIC) 50 unit SolR Inject into the muscle.  Historical Provider, MD   ranitidine (ZANTAC) 300 MG tablet Take 1 tablet (300 mg total) by mouth 3 times a day.  Court Joy, MD   triamcinolone (KENALOG) 0.1 % ointment Apply topically 2 times a day.  Aldean Jewett, MD          Review of Systems   Constitutional: Positive for weight gain. Negative for activity change, appetite change, chills, fever and weight loss.        Has gained about 18 pounds in the last month   HENT: Negative for sore throat and trouble swallowing.    Eyes: Positive for visual disturbance. Negative for pain, redness and itching.        Wears glasses  (B) cataracts causing trouble seeing in both eyes, worse in right   Respiratory: Negative for cough, shortness of breath and wheezing.    Cardiovascular: Positive for palpitations. Negative for chest pain and leg swelling.        Intermittent palpitations   Gastrointestinal: Positive for diarrhea. Negative for abdominal distention, abdominal pain, blood in stool, constipation and vomiting.        Intermittent diarrhea   Genitourinary: Negative for dysuria and hematuria.   Musculoskeletal: Positive for arthralgias and back pain. Negative for myalgias, neck pain and neck stiffness.        Left leg pain  Chronic low back pain     Skin: Positive for rash. Negative for color change and wound.         poison ivy to right arm   Neurological: Positive for headaches. Negative for dizziness, seizures, syncope, weakness and numbness.        Intermittent chronic headaches, gets botox therapy   Hematological: Does not bruise/bleed easily.   Psychiatric/Behavioral: Negative for agitation, confusion, hallucinations, self-injury and suicidal ideas.   All other systems reviewed  and are negative.      Objective:   Blood pressure 134/78, pulse 102, temperature 99.1 ??F (37.3 ??C), temperature source Oral, height 5' 7 (1.702 m), weight 179 lb (81.2 kg), SpO2 96 %.    Physical Exam   Vitals reviewed.  Constitutional: She is oriented to person, place, and time. She appears well-developed and well-nourished. No distress.   HENT:   Head: Normocephalic and atraumatic.   Right Ear: Hearing and external ear normal.   Left Ear: Hearing and external ear normal.   Nose: Nose normal.   Mouth/Throat: Uvula is midline, oropharynx is clear and moist and mucous membranes are normal.   Eyes: Conjunctivae, EOM and lids are normal. Pupils are equal, round, and reactive to light. Right conjunctiva is not injected. No scleral icterus.   Neck: Trachea normal and normal range of motion. Neck supple. No JVD present. Carotid bruit is not present. No tracheal deviation present. No thyroid mass and no thyromegaly present.   Cardiovascular: Normal rate, regular rhythm, S1 normal, S2 normal and normal heart sounds.  Exam reveals no gallop and no friction rub.    No murmur heard.  Pulses:       Carotid pulses are 2+ on the right side, and 2+ on the left side.       Radial pulses are 2+ on the right side, and 2+ on the left side.        Dorsalis pedis pulses are 2+ on the right side, and 2+ on the left side.        Posterior tibial pulses are 2+ on the right side, and 2+ on the left side.   Pulmonary/Chest: Effort normal and breath sounds normal. No stridor. No apnea. No respiratory distress. She has no wheezes. She has no rhonchi. She has no rales.   Abdominal: Soft. Bowel sounds are normal. She exhibits no distension and no mass. There is no tenderness. There is no rebound and no guarding.   Musculoskeletal: Normal range of motion. She exhibits no edema or tenderness.   Motor strength 4/5 BUE, BLE   Lymphadenopathy:     She has no cervical adenopathy.   Neurological: She is alert and oriented to person, place, and time.  She has normal strength. She displays no tremor. No cranial nerve deficit or sensory deficit. She exhibits normal muscle tone. Coordination and gait normal.   Skin: Skin is warm and dry. No petechiae, no purpura and no rash noted. She is not diaphoretic. No cyanosis or erythema. No pallor. Nails show no clubbing.   Psychiatric: She has a normal mood and affect. Her speech is normal and behavior is normal. Judgment and thought content normal.       Airway:  Mallampati I (soft palate, uvula, fauces, and tonsillar pillars visible), Thyromental distance 3 finger breadths, opening 3 finger breadths. She denies dentures      Lab Review:     Lab Results   Component Value Date    WBC 8.6 06/04/2016    HGB 13.9 06/04/2016    HCT 43.7 06/04/2016    MCH 28.1 06/04/2016    PLT 310 06/04/2016    GLUCOSE 150 (H) 06/04/2016    CREATININE 0.81 06/04/2016    NA 141 06/04/2016    K 3.7 06/04/2016    CL 107 06/04/2016    CO2 23 06/04/2016         Study Results:   Pulmonary function test 07/12/2016     07/12/16 1335     FEV1 3.02    FEV1% 105    FVC 4.10    FVC% 111    FEV1/FVC 74    FEV1/FVC EXP 79    RESPONSE TO BRONCHODILATOR % FVC, % FEV1    TLC 5.86    TLC% 105    RV 1.63    RV% 85    VC 4.23    VC% 115    DLCO 27.17    DLCO% 109    Impression   [facility]    Pulmonary Function Tests Interpretation    [place] Nolton is a 56 y.o. female who had a pulmonary function test performed at the [facility].    PFT Spirometry, Lung volumes and Diffusing capacity tests are normal          ECHO 06/2015 (from copies that patient brought with her)  Normal LV and RV function  EF 65%  Trace MR  Grade 2 DD    ASA Physical Status:   ASA Physical Status:  3       Assessment and Recommendations:   This is a 56 yo female with painful orthopaedic hardware for removal of hardware left tibia.    Concurrent medical conditions include:  1.  Cardiac Risk:  No h/o MI, CAD  or CHF. She has been evaluated by cardiology in the past at several hospitals out of state. + history of WPW, Atrial fibrillation. + cardiac arrest in 2012 she states secondary to an allergic reaction. Most recent ECHO 06/2015 showed EF 65% with trace MR and grade 2 DD. Reports good functional status, Duke Activity Score 4.  Denies orthopnea, CP and SOB.  She does not exercise regularly, but is able to occasionally walk around her neighborhood without experiencing CP or DOE.  Non-smoker.  No further cardiac w/u indicated.     2. Atrial fibrillation / WPW: reports 2 attempted unsuccessful ablations. HR today is 102 and regular. She was recently evaluated by Dr. Cyndie Chime of EP here, last on 07/28/2016. Per his note:    RECOMMENDATIONS:  1. There is no evidence available at the time of this clinic visit that the patient had any episode of atrial fibrillation. If she did have an episode of atrial fibrillation during stress, and this episode has not recurred, no further treatment is necessary.  2. There is no indication for additional EP workup or treatment for WPW at this time. A concealed accessory pathway is possible. Recommend avoiding AV nodal blocking agents until documentation is available from the prior EP studies.  3. Agree with continued follow up with psych and allergy and other specialists.  4. Centralize care through primary care physician and psychiatry.  5. Should the patient develop any signs of arrhythmia or have any evidence of arrhythmia, we would be happy to see the patient in cardiac EP clinic for further evaluation and management    3. Anxiety / Depression: She is taking Buspar; recommend taking AM of OR.     4.  Asthma:  Symptoms controlled on Levalbuterol nebs as needed (she states that she cannot use inhalers because of the propellants) as well as Singulair which she can use AM of OR.  Denies any recent exacerbation.  Lung sounds today clear t/o, O2 saturation 96% on room air. PFTs 06/2016  showed DLCO% 109, FEV1% 105.    5. Hypothyroidism: The patient takes Levothyroxine daily.  Recommend taking AM of OR.    6. GERD:  The patient is controlled on Omeprazole and raniditine which she should take AM of OR.    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 Denies any syncope in the last two months.  Patient states that mastocytosis was diagnosed in the 1990s however, under recent evaluation with [facility] allergy it was determined that the pt. did not fulfill the criteria for a diagnosis of systemic mastocytosis. Additional workup was done through them.  Per note on 06/17/2016    A&P: After a comprehensive review of medical history, previous tests/bone marrow biopsies, and serum tryptase during an acute attack there is no evidence of mastocytosis. Had a long conversation with the patient regarding these findings. Also had a discussion about what idiopathic mast cell activation syndrome (IMCAS) is. Main complaints are dizziness, abdominal cramps, and diarrhea for acute flares, no evidence of flushing and no flares consistent with anaphylaxis. This condition does produce elevations of tryptase in the serum or elevations of mast cell mediators in the urine following episodes  of symptoms which is not consistent with the patients presentation. No history of Ethlers Danlos or POTS.     Dizziness and neurologic complaints could be secondary to side effects of high dose antihistamine regimen. Let start with wean of Benadryl, reduce to three time a day x 1 week, then two times a day x1 week, then once daily at night only. Continue on other antihistamines. Would like documentation of hives/angioedema/flushing breakthrough reactions during wean of Benadryl. Instructed patient to send pictures, consider skin biopsy with acute flare. If confirmed hives/angiodema will consider starting on Xolair treatment to  allow patient to wean antihistamines.     She is taking Cromolyn, Allegra, Levalbuterol, Benadryl, all of which she may continue perioperatively.    8. Edema: taking Lasix which she should hold AM of OR.    9. Anemia: has received iron injections in the past.  Most recent H&H 13.9/43.7 on 06/04/2016.    10. MSSA soft tissue infection: diagnosed in 07/2015 after her initial tibia fracture surgery. Had infection to lower lip and bridge of nose requiring I&D. She states this was thought to be related to a surgical mask that she wore.       I have conveyed my assessment and recommendations to the referring provider via shared EMR.  Preoperative instructions reviewed with patient. Patient verbalizes understanding.  No labs obtained today    Ivor Reining CNP    Attending Attestation and Medical Decision Making    I have seen and examined this patient along with Ivor Reining, CNP.  I have reviewed the above note as well as relevant laboratory data, imaging data and pertinent elements of the patient???s medical record.    Recommendations for upcoming procedure include:  Type of Anesthetic: GETA  IV Access: PIV  Monitors: Standard ASA monitors  Analgesia: Patient states she has anaphylaxis to tylenol, NSAIDs, gabapentin and lidocaine. Patient would prefer to opioids, but tolerates hydromorphone.  Postoperative Disposition: PACU    Special considerations for this patient and their upcoming procedure include: Patient has had several uneventful general anesthetics, but she has also had what appears to be a severe contact dermatitis related to the mask used for pre-oxygenation.  Recommend GETA with rapid sequence intubation.  She has never had a problem with propofol or hydromorphone.  I recommend minimizing exposure to anesthetic drugs;  would use only propofol, NMB, hydromorphone and an inhalational agent for the anesthetic, and pretreat with IV benadryl and IV corticosteroid.  Per the Allergy consult note, she does not have  mastocytosis or IMCAS, but she may be prone to angiodema and/or contact dermatitis.   OK to proceed as scheduled.      The patient verbalized understanding of surgery and anesthetic plan and agree to proceed.    I have communicated my recommendations for the perioperative care of this patient with the surgeon through a shared EMR.    Lenice Llamas, MD

## 2016-08-05 NOTE — Unmapped (Signed)
Pre-Procedure Instructions    We???re pleased that you have chosen [facility] for your upcoming procedure.  The staff serving you is professionally trained to provide the highest quality care.  We encourage you to ask questions and to let the staff know your special needs.  We want your visit to be as comfortable as possible.    Your procedure is scheduled on 08/17/2016 at 7:30 AM.  Please arrive at 5:30 AM at: [facility] on the second floor of the main hospital     ??? DO NOT EAT OR DRINK ANYTHING (including gum, mints, water, etc.) after midnight the night before your procedure.  You may brush your teeth and gargle on the morning of surgery, but do not swallow any water.  You may take the following medications with a small sip of water:    1. Buspar  2. Levothyroxine  3. Omeprazole  4. Cromolyn  5. Allegra  6. Benadryl  ??? Please make transportation arrangements and bring a responsible adult to accompany you home and remain with you for 24 hours.  ??? Leave valuables (money, jewelry, credit cards) at home.  If you wear glasses or contacts, bring a case for safekeeping.  ??? Wear casual, loose fitting, and comfortable clothing.  A gown will be provided.  If you are staying overnight, bring a small overnight bag.  (Storage space is limited.)  ??? Please remove all makeup, jewelry, body piercings, powder, perfume, and nail polish before you arrive.  ??? Bring a list of your medications and dose including herbal and over-the-counter medications.  Do not bring any pills or medications to the hospital. (Exception: transplant patients.)  ??? Bring a photo ID and your insurance card so we can bill your insurance company directly.  ??? Please do not bring any children under the age of 19 to the hospital.  ??? Do not shave in the area of the surgery for 2 days prior to surgery.  If needed, a trained staff member will clip the area immediately before your surgery.  ??? Quit smoking as far in advance of surgery as possible.   Patients who quit at least 30 days before surgery may have better outcomes.  ??? If you are diabetic, pay close attention to your blood sugar and try to keep it in the range your doctor wants it to be in.  If you have low blood sugar prior to coming in to the hospital you may drink a small amount of CLEAR SUGAR-CONTAINING FLUIDS WITHOUT PULP SUCH AS APPLE JUICE OR CLEAR SODA.  Depending on the procedure you are having this could impact the timing of your surgery.  ??? Discuss discontinuing herbal medications with your doctor before surgery.  ??? Please shower at home the evening before and the morning of surgery using an antiseptic agent, if provided, or soap and water.  ??? If you suspect that you have an infection prior to surgery, contact your doctor and surgeon prior to surgery.  Also tell the pre-surgery nurse on the day of surgery.    Additional instructions:  Stop vitamins, supplements, fish oil, NSAIDS (Ibuprofen, Advil, Aleve, Motrin, Naproxen, Meloxicam, Aspirin) for one week prior to surgery.  May take Tylenol as needed for pain      Contact information:  [facility], Monday - Friday 8:30 am - 5:00 pm   [phone]  [facility], - Friday 8:30 am - 5:00 pm   [phone]

## 2016-08-09 MED ORDER — cromolyn (GASTROCROM) 100 mg/5 mL solution
100 | ORAL | 0 refills | 24.00000 days | Status: AC
Start: 2016-08-09 — End: 2016-09-11

## 2016-08-10 NOTE — Unmapped (Signed)
Patient called and requested a PT order and stated that this was needed in order for her to get assistance from [employer] to have a ramp installed at the front steps leading into her home following surgery for safety and stability reasons.  PT order was faxed to [employer] at [phone] with confirmation received 7/24 @ 2:28 pm.

## 2016-08-11 ENCOUNTER — Ambulatory Visit: Admit: 2016-08-11 | Discharge: 2016-08-11 | Payer: MEDICARE | Attending: Speech-Language Pathologist

## 2016-08-11 DIAGNOSIS — R490 Dysphonia: Secondary | ICD-10-CM

## 2016-08-11 NOTE — Unmapped (Signed)
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  Background History:     Pt with complex medical history. 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 She underwent hypnoptherapy - can use this technique coupled with breathing exercises to eliminate VCD and MTD. Complains of severe inflammation and muscle tension throughout body - routinely gets botox injected base of skull and down spine for treatment. Pt has a handle on VCD but feels impaired on a daily basis by her voice. Complains of constant odynophonia and admits this is very much perpetuated by anxiety. Pt states her voice will get squeaky at times - tries not to talk throughout the day. Pt is disabled therefore voicing not required for work purposes but very much limited in social life. Pt on Zantac 300 mg 3x per day and trying to get Prilosec covered by insurance. Occasional dysphagia (cough with liquids, solid foods get stuck) but can manage with coping mechanisms and relaxation techniques. Dysphagia incidents range from 2-3x per day when she is anxious to no dysphagia incidents for months if she is feeling calm.    Ms. Ureste presents with grossly normal vocal function with momentary pitch breaks and strain complaining of odynophonia and unreliable vocal quality secondary to vocal fold edema and muscle tension. Pt with previously diagnosed paradoxical vocal fold motion, currently managed with breathing strategies after hypnotherapy and relaxation techniques. Pt mainly symptomatic for odynophonia and muscle tension. Videostroboscopy today revealed bilateral TVF edema and questionable slight bowing on LTVF. Glottic closure characterized primarily by posterior gap and anterior gap at all pitches.  Complete glottic closure attained with cues for increased breath support. Supraglottic compression noted during entirety of exam. In addition, thick mucus observed despite throat clearing and sips of water. Subglottis was patent without evidence of narrowing. No mass lesions, paresis or paralysis was noted. Voice therapy recommended at this time to focus on optimizing laryngeal functioning with reduced tension and voicing techniques to minimize odynophonia. Pt already on medication and behavioral modifications for acid reflux. Pt was amenable and agreed to the plan of care.     Subjective: Pt states she had a couple VCD/throat attacks when getting out of the bath this morning but was able to use resistive breathing and calming techniques to recover. She states she has been consciously trying to speak in a more forward place. I don't notice the scratchiness or rasp anymore. She also reports a reduction in throat pain.   ??  Pt currently intakes:  Water - 64 + ounces per day  Caffeine - 8 ounces coffee per day    Objective:    Goal: Session #1: Session #2: Session #3: Session #4:   Pt will adhere to vocal hygiene protocol during daily life with 80% accuracy given mod cues Pt expressed understanding    Discussed importance of hydration  Addressed. Pt adhering with ease Goal Met    Pt will adhere to acid reflux management diet during daily life with 80% accuracy given moderate cues Pt expressed understanding     Currently adhering to anti-reflux diet. Pt very sensitive to foods/drink Addressed. Pt adhering with ease Goal Met    Pt will perform abdominal breathing exercises at  rest and during phonation with 80% accuracy and mod cues Pt performed abdominal breathing exercises:  -at rest with 65% accuracy and max cues  -during phonation with 60% accuracy and max cues Pt performed abdominal breathing exercises:    -during phonation with 70% accuracy and max cues Goal Met    Pt will perform SOVT exercises during various  voicing exercises with 80% accuracy given mod cues Pt performed SOVT strawflow exercises during sustained phonation with 60% accuracy and mod cues Pt performed SOVT strawflow exercises during sustained phonation with 75% accuracy and mod cues    Air added before high pitches Goal Met    Pt will perform RVT exercises with forward-focused resonance, adequate airflow, and buzzy phonation with 80% accuracy and mod cues Pt performed RVT exercises with 65% accuracy and max cues    Hums, hums + vowels, inflected vowels, and chanted m resonant sentences today Pt performed RVT exercises with 70% accuracy and max cues    Hums, hums + vowels, inflected vowels, and chanted m resonant sentences today    Cues to smack lips with /m/s Goal Met    Pt will complete laryngeal/general relaxation stretches/exercises with 90% accuracy and no cues. Pt independent and utilizing Pt independent and utilizing Goal Met      Assessment: Ms. Cadle presents with grossly normal vocal function with momentary pitch breaks and strain complaining of odynophonia and unreliable vocal quality secondary to vocal fold edema and muscle tension. Videostroboscopy revealed bilateral TVF edema, questionable slight bowing on LTVF, and muscle tension. Patient completed three sessions of voice therapy and has successfully rehabilitated her voice to sound grossly normal and patient reports a reduction in throat pain. Patient feels equipped with tools to improve voice and reduce laryngeal tension. Decided upon discharge at this time and pt free to RTC as needed. It was my pleasure to work with Ms. Darcey Nora.     Plan:   Follow HEP - Pt provided with handout for home practice  Discharge/RTC as needed  ??  Thank You,    Tommie Raymond, M.A., CF-SLP  Speech-Language Pathology Clinical Fellow   [facility]   Dept. of Otolaryngology - Head & Neck Surgery  [facility] [place], [place]    The fellow has directed the delivery of services while the  qualified practitioner is present in the room or is responsible for the direction of the assessment and treatment.     Thank You,    Sharene Butters, M.A., CCC-SLP  Speech-Language Pathologist  [facility]   Dept. of Otolaryngology - Head & Neck Surgery  [place] [facility] [place], [place]

## 2016-08-17 ENCOUNTER — Observation Stay
Admit: 2016-08-17 | Discharge: 2016-08-19 | Disposition: A | Discharge status: Home / Self Care | Payer: MEDICARE | Source: Ambulatory Visit

## 2016-08-17 ENCOUNTER — Ambulatory Visit: Admit: 2016-08-17 | Payer: MEDICARE

## 2016-08-17 DIAGNOSIS — T8484XA Pain due to internal orthopedic prosthetic devices, implants and grafts, initial encounter: Secondary | ICD-10-CM

## 2016-08-17 MED ORDER — busPIRone (BUSPAR) tablet 10 mg
10 | Freq: Once | ORAL | Status: AC
Start: 2016-08-17 — End: 2016-08-17
  Administered 2016-08-18: 02:00:00 10 mg via ORAL

## 2016-08-17 MED ORDER — ondansetron (ZOFRAN) injection
4 | INTRAMUSCULAR | Status: AC | PRN
Start: 2016-08-17 — End: 2016-08-17
  Administered 2016-08-17: 12:00:00 4 via INTRAVENOUS

## 2016-08-17 MED ORDER — ondansetron (ZOFRAN) 4 mg/2 mL injection
4 | INTRAMUSCULAR | Status: AC
Start: 2016-08-17 — End: ?

## 2016-08-17 MED ORDER — HYDROmorphone (DILAUDID) injection Syrg 0.25 mg
0.5 | INTRAMUSCULAR | Status: AC | PRN
Start: 2016-08-17 — End: 2016-08-19

## 2016-08-17 MED ORDER — fentaNYL (SUBLIMAZE) injection 50 mcg
50 | INTRAMUSCULAR | Status: AC | PRN
Start: 2016-08-17 — End: 2016-08-19

## 2016-08-17 MED ORDER — pantoprazole (PROTONIX) EC tablet 80 mg
40 | Freq: Two times a day (BID) | ORAL | Status: AC
Start: 2016-08-17 — End: 2016-08-19
  Administered 2016-08-18 – 2016-08-19 (×4): 80 mg via ORAL

## 2016-08-17 MED ORDER — NON FORMULARY 1 each
Freq: Three times a day (TID) | Status: AC
Start: 2016-08-17 — End: 2016-08-17
  Administered 2016-08-17: 19:00:00 1 via ORAL

## 2016-08-17 MED ORDER — busPIRone (BUSPAR) tablet 5 mg
5 | Freq: Two times a day (BID) | ORAL | Status: AC
Start: 2016-08-17 — End: 2016-08-18
  Administered 2016-08-17 – 2016-08-18 (×3): 5 mg via ORAL

## 2016-08-17 MED ORDER — ondansetron (ZOFRAN) injection 4 mg
4 | Freq: Three times a day (TID) | INTRAMUSCULAR | Status: AC | PRN
Start: 2016-08-17 — End: 2016-08-19
  Administered 2016-08-17: 15:00:00 4 mg via INTRAVENOUS

## 2016-08-17 MED ORDER — ciprofloxacin (CIPRO) 400 mg in dextrose 5% 200 mL IVPB
400 | INTRAVENOUS | Status: AC | PRN
Start: 2016-08-17 — End: 2016-08-17
  Administered 2016-08-17: 12:00:00 400 via INTRAVENOUS

## 2016-08-17 MED ORDER — cromolyn (GASTROCROM) solution 200 mg
100 | Freq: Three times a day (TID) | ORAL | Status: AC
Start: 2016-08-17 — End: 2016-08-19
  Administered 2016-08-18 – 2016-08-19 (×5): 200 mg via ORAL

## 2016-08-17 MED ORDER — dexamethasone (DECADRON) 4 mg/mL injection
4 | INTRAMUSCULAR | Status: AC
Start: 2016-08-17 — End: ?

## 2016-08-17 MED ORDER — HYDROmorphone (DILAUDID) injection Syrg 0.1 mg
0.5 | INTRAMUSCULAR | Status: AC | PRN
Start: 2016-08-17 — End: 2016-08-17

## 2016-08-17 MED ORDER — proMETHazine (PHENERGAN) injection 6.25 mg
25 | Freq: Four times a day (QID) | INTRAMUSCULAR | Status: AC | PRN
Start: 2016-08-17 — End: 2016-08-19
  Administered 2016-08-17: 13:00:00 6.25 mg via INTRAVENOUS

## 2016-08-17 MED ORDER — fentaNYL (SUBLIMAZE) injection 12.5 mcg
50 | INTRAMUSCULAR | Status: AC | PRN
Start: 2016-08-17 — End: 2016-08-19

## 2016-08-17 MED ORDER — ondansetron (ZOFRAN) tablet 4 mg
4 | Freq: Four times a day (QID) | ORAL | Status: AC | PRN
Start: 2016-08-17 — End: 2016-08-19

## 2016-08-17 MED ORDER — HYDROmorphone (DILAUDID) injection Syrg 0.2 mg
0.5 | INTRAMUSCULAR | Status: AC | PRN
Start: 2016-08-17 — End: 2016-08-17

## 2016-08-17 MED ORDER — ondansetron (ZOFRAN) injection 4 mg
4 | Freq: Four times a day (QID) | INTRAMUSCULAR | Status: AC | PRN
Start: 2016-08-17 — End: 2016-08-19
  Administered 2016-08-17: 22:00:00 4 mg via INTRAVENOUS

## 2016-08-17 MED ORDER — rocuronium (ZEMURON) 10 mg/mL injection
10 | INTRAVENOUS | Status: AC
Start: 2016-08-17 — End: ?

## 2016-08-17 MED ORDER — oxyCODONE (ROXICODONE) immediate release tablet 5 mg
5 | ORAL | Status: AC | PRN
Start: 2016-08-17 — End: 2016-08-19

## 2016-08-17 MED ORDER — HYDROmorphone (DILAUDID) injection Syrg 0.4 mg
0.5 | INTRAMUSCULAR | Status: AC | PRN
Start: 2016-08-17 — End: 2016-08-17
  Administered 2016-08-17 (×3): 0.4 mg via INTRAVENOUS

## 2016-08-17 MED ORDER — levothyroxineSYNTHROIDLEVOTHROIDtablet112mcg
112 | Freq: Every day | ORAL | Status: AC
Start: 2016-08-17 — End: 2016-08-19
  Administered 2016-08-18 – 2016-08-19 (×2): 112 ug via ORAL

## 2016-08-17 MED ORDER — famotidine (PEPCID) tablet 40 mg
20 | Freq: Three times a day (TID) | ORAL | Status: AC
Start: 2016-08-17 — End: 2016-08-19
  Administered 2016-08-17 – 2016-08-19 (×6): 40 mg via ORAL

## 2016-08-17 MED ORDER — sodium chloride, irrigation 0.9 % irrigation
0.9 | Status: AC | PRN
Start: 2016-08-17 — End: 2016-08-17
  Administered 2016-08-17: 12:00:00 1000

## 2016-08-17 MED ORDER — ceFAZolin (ANCEF) 1 g in sodium chloride 0.9% 100 mL ADDaptor IVPB
1 | Freq: Three times a day (TID) | INTRAMUSCULAR | Status: AC
Start: 2016-08-17 — End: 2016-08-17
  Administered 2016-08-17 – 2016-08-18 (×2): 1 g via INTRAVENOUS

## 2016-08-17 MED ORDER — ondansetron (ZOFRAN) injection 8 mg
4 | Freq: Four times a day (QID) | INTRAMUSCULAR | Status: AC | PRN
Start: 2016-08-17 — End: 2016-08-19

## 2016-08-17 MED ORDER — dexamethasone (DECADRON) injection 4 mg
4 | Freq: Once | INTRAMUSCULAR | Status: AC | PRN
Start: 2016-08-17 — End: 2016-08-17

## 2016-08-17 MED ORDER — propofol 10 mg/ml (DIPRIVAN) 10 mg/mL injection
10 | INTRAVENOUS | Status: AC
Start: 2016-08-17 — End: ?

## 2016-08-17 MED ORDER — loratadine (CLARITIN) tablet 10 mg
10 | Freq: Every day | ORAL | Status: AC
Start: 2016-08-17 — End: 2016-08-17
  Administered 2016-08-17: 16:00:00 10 mg via ORAL

## 2016-08-17 MED ORDER — diphenhydrAMINE (BENADRYL) capsule 50 mg
25 | Freq: Three times a day (TID) | ORAL | Status: AC
Start: 2016-08-17 — End: 2016-08-19
  Administered 2016-08-17 – 2016-08-19 (×5): 50 mg via ORAL

## 2016-08-17 MED ORDER — famotidine (PF) (PEPCID) 20 mg/2 mL injection
20 | INTRAVENOUS | Status: AC
Start: 2016-08-17 — End: ?

## 2016-08-17 MED ORDER — albuterol (PROVENTIL) nebulizer solution 2.5 mg
2.5 | Freq: Four times a day (QID) | RESPIRATORY_TRACT | Status: AC | PRN
Start: 2016-08-17 — End: 2016-08-19

## 2016-08-17 MED ORDER — naloxone (NARCAN) injection 0.04 mg
0.4 | INTRAMUSCULAR | Status: AC | PRN
Start: 2016-08-17 — End: 2016-08-19

## 2016-08-17 MED ORDER — dexamethasone (DECADRON) injection
4 | INTRAMUSCULAR | Status: AC | PRN
Start: 2016-08-17 — End: 2016-08-17
  Administered 2016-08-17: 12:00:00 8 via INTRAVENOUS

## 2016-08-17 MED ORDER — propofol 10 mg/ml (DIPRIVAN) injection
10 | INTRAVENOUS | Status: AC | PRN
Start: 2016-08-17 — End: 2016-08-17
  Administered 2016-08-17: 12:00:00 200 via INTRAVENOUS

## 2016-08-17 MED ORDER — oxyCODONE (ROXICODONE) immediate release tablet 2.5 mg
5 | ORAL | Status: AC | PRN
Start: 2016-08-17 — End: 2016-08-19

## 2016-08-17 MED ORDER — succinylcholine (QUELICIN) 20 mg/mL injection
20 | INTRAMUSCULAR | Status: AC
Start: 2016-08-17 — End: ?

## 2016-08-17 MED ORDER — lidocaine (PF) 2% (20 mg/mL) 20 mg/mL (2 %) Soln
20 | INTRAMUSCULAR | Status: AC
Start: 2016-08-17 — End: ?

## 2016-08-17 MED ORDER — peppermint oil liquid 1 mL
Status: AC | PRN
Start: 2016-08-17 — End: 2016-08-19

## 2016-08-17 MED ORDER — lactated Ringers infusion 100 mL
INTRAVENOUS | Status: AC
Start: 2016-08-17 — End: 2016-08-19
  Administered 2016-08-17 – 2016-08-19 (×4): 100 mL via INTRAVENOUS

## 2016-08-17 MED ORDER — oxyCODONE (ROXICODONE) immediate release tablet 5 mg
5 | Freq: Once | ORAL | Status: AC | PRN
Start: 2016-08-17 — End: 2016-08-17
  Administered 2016-08-17: 13:00:00 5 mg via ORAL

## 2016-08-17 MED ORDER — enoxaparin (LOVENOX) for prophylaxis syringe 30 mg/0.3 mL
30 | Freq: Two times a day (BID) | SUBCUTANEOUS | Status: AC
Start: 2016-08-17 — End: 2016-08-19
  Administered 2016-08-17 – 2016-08-19 (×5): 30 mg via SUBCUTANEOUS

## 2016-08-17 MED ORDER — fentaNYL (SUBLIMAZE) injection
50 | INTRAMUSCULAR | Status: AC | PRN
Start: 2016-08-17 — End: 2016-08-17
  Administered 2016-08-17 (×2): 50 via INTRAVENOUS
  Administered 2016-08-17: 12:00:00 100 via INTRAVENOUS

## 2016-08-17 MED ORDER — triamcinolone (KENALOG) 0.1 % ointment
0.1 | Freq: Two times a day (BID) | TOPICAL | Status: AC
Start: 2016-08-17 — End: 2016-08-19

## 2016-08-17 MED ORDER — lactated Ringers infusion
INTRAVENOUS | Status: AC | PRN
Start: 2016-08-17 — End: 2016-08-17
  Administered 2016-08-17: 11:00:00 via INTRAVENOUS

## 2016-08-17 MED ORDER — fentaNYL (SUBLIMAZE) injection 6.5 mcg
50 | INTRAMUSCULAR | Status: AC | PRN
Start: 2016-08-17 — End: 2016-08-19

## 2016-08-17 MED ORDER — midazolam (PF) (VERSED) 1 mg/mL injection
1 | INTRAMUSCULAR | Status: AC
Start: 2016-08-17 — End: ?

## 2016-08-17 MED ORDER — fentaNYL (SUBLIMAZE) injection 25 mcg
50 | INTRAMUSCULAR | Status: AC | PRN
Start: 2016-08-17 — End: 2016-08-19

## 2016-08-17 MED ORDER — HYDROmorphone (DILAUDID) injection Syrg 0.5 mg
0.5 | INTRAMUSCULAR | Status: AC | PRN
Start: 2016-08-17 — End: 2016-08-19
  Administered 2016-08-17 – 2016-08-19 (×6): 0.5 mg via INTRAVENOUS

## 2016-08-17 MED ORDER — HYDROmorphoneDILAUDIDinjectionSyrg06mg
0.5 | INTRAMUSCULAR | Status: AC | PRN
Start: 2016-08-17 — End: 2016-08-17
  Administered 2016-08-17 (×3): 0.6 mg via INTRAVENOUS

## 2016-08-17 MED ORDER — famotidine (PF) (PEPCID) injection
20 | INTRAVENOUS | Status: AC | PRN
Start: 2016-08-17 — End: 2016-08-17
  Administered 2016-08-17: 11:00:00 20 via INTRAVENOUS

## 2016-08-17 MED ORDER — traMADol (ULTRAM) tablet 50 mg
50 | Freq: Four times a day (QID) | ORAL | Status: AC | PRN
Start: 2016-08-17 — End: 2016-08-17

## 2016-08-17 MED ORDER — cetirizine (ZYRTEC) tablet 10 mg
5 | Freq: Two times a day (BID) | ORAL | Status: AC
Start: 2016-08-17 — End: 2016-08-19
  Administered 2016-08-18 – 2016-08-19 (×4): 10 mg via ORAL

## 2016-08-17 MED ORDER — bisacodyl (DULCOLAX) suppository 10 mg
10 | Freq: Every day | RECTAL | Status: AC | PRN
Start: 2016-08-17 — End: 2016-08-19

## 2016-08-17 MED ORDER — senna-docusate (SENNA-S) 8.6-50 mg per tablet 1 tablet
8.6-50 | Freq: Two times a day (BID) | ORAL | Status: AC
Start: 2016-08-17 — End: 2016-08-19
  Administered 2016-08-17: 16:00:00 1 via ORAL

## 2016-08-17 MED ORDER — ondansetron (ZOFRAN) tablet 8 mg
8 | Freq: Four times a day (QID) | ORAL | Status: AC | PRN
Start: 2016-08-17 — End: 2016-08-19

## 2016-08-17 MED ORDER — midazolam (PF) (VERSED) injection
1 | INTRAMUSCULAR | Status: AC | PRN
Start: 2016-08-17 — End: 2016-08-17
  Administered 2016-08-17: 12:00:00 2 via INTRAVENOUS

## 2016-08-17 MED ORDER — succinylcholine (QUELICIN) injection
20 | INTRAMUSCULAR | Status: AC | PRN
Start: 2016-08-17 — End: 2016-08-17
  Administered 2016-08-17: 12:00:00 100 via INTRAVENOUS

## 2016-08-17 MED ORDER — fentaNYL (SUBLIMAZE) 50 mcg/mL injection
50 | INTRAMUSCULAR | Status: AC
Start: 2016-08-17 — End: ?

## 2016-08-17 MED ORDER — oxyCODONE (ROXICODONE) immediate release tablet 10 mg
5 | Freq: Once | ORAL | Status: AC | PRN
Start: 2016-08-17 — End: 2016-08-17

## 2016-08-17 MED ORDER — traMADol (ULTRAM) half tablet 25 mg
50 | Freq: Four times a day (QID) | ORAL | Status: AC | PRN
Start: 2016-08-17 — End: 2016-08-17

## 2016-08-17 MED ORDER — diphenhydrAMINE (BENADRYL) injection
50 | INTRAMUSCULAR | Status: AC | PRN
Start: 2016-08-17 — End: 2016-08-17
  Administered 2016-08-17 (×2): 25 via INTRAVENOUS

## 2016-08-17 MED ORDER — montelukast (SINGULAIR) tablet 10 mg
10 | Freq: Every evening | ORAL | Status: AC
Start: 2016-08-17 — End: 2016-08-19
  Administered 2016-08-18 – 2016-08-19 (×2): 10 mg via ORAL

## 2016-08-17 MED ORDER — diphenhydrAMINEBENADRYL50mgmLinjection
50 | INTRAMUSCULAR | Status: AC
Start: 2016-08-17 — End: ?

## 2016-08-17 MED FILL — HYDROMORPHONE 0.5 MG/0.5 ML INJECTION SYRINGE: 0.5 0.5 mg/0.5 mL | INTRAMUSCULAR | Qty: 0.5

## 2016-08-17 MED FILL — ENOXAPARIN 30 MG/0.3 ML SUBCUTANEOUS SYRINGE: 30 30 mg/0.3 mL | SUBCUTANEOUS | Qty: 0.3

## 2016-08-17 MED FILL — FAMOTIDINE (PF) 20 MG/2 ML INTRAVENOUS SOLUTION: 20 20 mg/2 mL | INTRAVENOUS | Qty: 2

## 2016-08-17 MED FILL — MONTELUKAST 10 MG TABLET: 10 10 mg | ORAL | Qty: 1

## 2016-08-17 MED FILL — DILAUDID (PF) 0.5 MG/0.5 ML INJECTION SYRINGE: 0.5 0.5 mg/0.5 mL | INTRAMUSCULAR | Qty: 1

## 2016-08-17 MED FILL — LEVOTHYROXINE 112 MCG TABLET: 112 112 MCG | ORAL | Qty: 1

## 2016-08-17 MED FILL — TRIAMCINOLONE ACETONIDE 0.1 % TOPICAL OINTMENT: 0.1 0.1 % | TOPICAL | Qty: 15

## 2016-08-17 MED FILL — ONDANSETRON HCL (PF) 4 MG/2 ML INJECTION SOLUTION: 4 4 mg/2 mL | INTRAMUSCULAR | Qty: 2

## 2016-08-17 MED FILL — DEXAMETHASONE SODIUM PHOSPHATE 4 MG/ML INJECTION SOLUTION: 4 4 mg/mL | INTRAMUSCULAR | Qty: 1

## 2016-08-17 MED FILL — CEFAZOLIN 1 GRAM SOLUTION FOR INJECTION: 1 1 g | INTRAMUSCULAR | Qty: 1000

## 2016-08-17 MED FILL — BUSPIRONE 10 MG TABLET: 10 10 MG | ORAL | Qty: 1

## 2016-08-17 MED FILL — DILAUDID (PF) 0.5 MG/0.5 ML INJECTION SYRINGE: 0.5 0.5 mg/0.5 mL | INTRAMUSCULAR | Qty: 0.5

## 2016-08-17 MED FILL — FAMOTIDINE 20 MG TABLET: 20 20 MG | ORAL | Qty: 2

## 2016-08-17 MED FILL — QUELICIN 20 MG/ML INJECTION SOLUTION: 20 20 mg/mL | INTRAMUSCULAR | Qty: 10

## 2016-08-17 MED FILL — OXYCODONE 5 MG TABLET: 5 5 MG | ORAL | Qty: 1

## 2016-08-17 MED FILL — LORATADINE 10 MG TABLET: 10 10 mg | ORAL | Qty: 1

## 2016-08-17 MED FILL — XYLOCAINE-MPF 20 MG/ML (2 %) INJECTION SOLUTION: 20 20 mg/mL (2 %) | INTRAMUSCULAR | Qty: 5

## 2016-08-17 MED FILL — PROPOFOL 10 MG/ML INTRAVENOUS EMULSION: 10 10 mg/mL | INTRAVENOUS | Qty: 20

## 2016-08-17 MED FILL — BUSPIRONE 5 MG TABLET: 5 5 MG | ORAL | Qty: 1

## 2016-08-17 MED FILL — SENNOSIDES 8.6 MG-DOCUSATE SODIUM 50 MG TABLET: 8.6-50 8.6-50 mg | ORAL | Qty: 1

## 2016-08-17 MED FILL — PROMETHAZINE 25 MG/ML INJECTION SOLUTION: 25 25 mg/mL | INTRAMUSCULAR | Qty: 1

## 2016-08-17 MED FILL — DIPHENHYDRAMINE 25 MG CAPSULE: 25 25 mg | ORAL | Qty: 2

## 2016-08-17 MED FILL — SODIUM CHLORIDE 0.9 % INTRAVENOUS PIGGYBACK: 1.00 1.00 g | INTRAVENOUS | Qty: 100

## 2016-08-17 MED FILL — BISAC-EVAC 10 MG RECTAL SUPPOSITORY: 10 10 mg | RECTAL | Qty: 1

## 2016-08-17 MED FILL — ROCURONIUM 10 MG/ML INTRAVENOUS SOLUTION: 10 10 mg/mL | INTRAVENOUS | Qty: 1

## 2016-08-17 MED FILL — PEPPERMINT OIL: 1.00 1.00 mL | Qty: 3.7

## 2016-08-17 MED FILL — FENTANYL (PF) 50 MCG/ML INJECTION SOLUTION: 50 50 mcg/mL | INTRAMUSCULAR | Qty: 5

## 2016-08-17 MED FILL — MIDAZOLAM (PF) 1 MG/ML INJECTION SOLUTION: 1 1 mg/mL | INTRAMUSCULAR | Qty: 2

## 2016-08-17 MED FILL — DIPHENHYDRAMINE 50 MG/ML INJECTION SOLUTION: 50 50 mg/mL | INTRAMUSCULAR | Qty: 1

## 2016-08-17 NOTE — Unmapped (Signed)
Anesthesia Extubation Criteria:    Airway Device: endotracheal tube    Emergence Details:      Smooth      _x_      Stormy       __       Prolonged   __     Extubation Criteria:      Motor strength intact       _x_      Follows commands        _x_      Good airway reflexes      _x_      OP suctioned                  _x_        Follows commands:  Yes     Patient extubated:  Yes

## 2016-08-17 NOTE — Unmapped (Signed)
ORTHOPAEDIC SURGERY PROGRESS NOTE    Subjective: Seen postop in PACU. No complaints. Pain well controlled. Denies numbness/tingling     Objective:  Temp:  [97.4 ??F (36.3 ??C)] 97.4 ??F (36.3 ??C)  Heart Rate:  [70] 70  Resp:  [18] 18  BP: (138)/(85) 138/85  FiO2:  [62 %-100 %] 99 %    General: NAD  CV/Pulm: Breathing unlabored  Abd: Soft  MSK:    LLE  Dressings c/d/i  SILT sp/dp/t/sural/saphenous  EHL/TA/GS intact  Toes up and down  Cap refill < 2 sec    Assessment:  Emily Bonilla is a 56 y.o. female with:  s/p ROH L tibia    Plan:  - Overnight observation for history of severe postop allergic reaction  - WBS: WBAT  - Diet: regular  - ABX: Ancef for 24 hours  - DVT Ppx: SCDs, Lovenox  - Ice, elevation, pain control  - Dispo: no PT OT, dispo home tomorrow    Jacarri Gesner Crist Infante, MD  Orthopaedic Surgery Resident

## 2016-08-17 NOTE — Unmapped (Signed)
Anesthesia Post Note    Patient: Emily Bonilla    Procedure(s) Performed: Procedure(s):  REMOVAL OF HARDWARE LEFT TIBIA    Anesthesia type: general    Patient location: PACU    Post pain: Adequate analgesia    Post assessment: no apparent anesthetic complications and tolerated procedure well    Last Vitals:   Vitals:    08/17/16 0845 08/17/16 0900 08/17/16 0915 08/17/16 0930   BP: 164/82 139/63 150/75 118/58   BP Location:    Left arm   Patient Position:    Lying   Pulse: 93 100 85 95   Resp: 21 22 18 13    Temp:    97.1 ??F (36.2 ??C)   TempSrc:    Temporal   SpO2: 99% 100% 95% 95%   Weight:       Height:            Post vital signs: stable    Level of consciousness: awake, alert  and oriented    Complications: None

## 2016-08-17 NOTE — Unmapped (Addendum)
[place]  DEPARTMENT OF ANESTHESIOLOGY  PRE-PROCEDURAL EVALUATION    Emily Bonilla is a 56 y.o. year old female presenting for:    Procedure(s):  REMOVAL OF HARDWARE LEFT TIBIA    Surgeon:   Andee Lineman, MD    Chief Complaint     Painful orthopaedic hardware (CMS Dx) [Z61.09UE]    Review of Systems     Anesthesia Evaluation    Patient summary reviewed, nursing notes reviewed and CPC/PAT note reviewed.  All other systems reviewed and are negative.     No history of anesthetic complications         Cardiovascular:    Exercise tolerance: poor  Duke Met score: 4 - Raking leaves. Weeding or pushing a power mower.  (+) dysrhythmias (Hx A fib/WPW, reportedly s/p 2 unsuccessful ablations), CHF (G2DD) and hyperlipidemia.  Hypertension (not currently on medications) is.  Valvular problems/murmurs (Trace) related to MR.    (-) past MI, CAD, CABG/stent.    Neuro/Muscoloskeletal/Psych:    (+) neuromuscular disease, psychiatric history, anxiety and depression.  Seizures (last seizure was November. Not on medication.).    (-) TIA, CVA, headaches.     Pulmonary:    (+) asthma.  Sleep apnea (postive for OSA per sleep studies).    (-) shortness of breath, recent URI, no PE.       GI/Hepatic/Renal:    GERD is well controlled.    (-) hepatitis, liver disease, renal disease.    Endo/Other:    (+) hypothyroidism.      (-) diabetes mellitus, no anemia, no thrombocytopenia.     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 Anaphylaxis    ??? Anemia    ??? Asthma    ??? Atrial fibrillation (CMS Dx)    ??? Bronchitis    ??? Heart disease    ??? Neurological disease    ??? OSA (obstructive sleep apnea)     states resolved   ??? Osteoporosis    ??? Spontaneous pneumothorax     states pneumomediatsium/pneumopericardium, self resolved   ??? Thyroid disease    ??? Ulcer (CMS Dx)    ??? WPW (Wolff-Parkinson-White syndrome)        Past Surgical History     Past Surgical  History:   Procedure Laterality Date   ??? BONE MARROW BIOPSY      benign   ??? CARDIAC ELECTROPHYSIOLOGY MAPPING AND ABLATION      attempted x 2, states not successful either time   ??? CHOLECYSTECTOMY     ??? FACIAL COSMETIC SURGERY     ??? HYSTERECTOMY     ??? LIVER BIOPSY      benign hepatic adednoma per patient   ??? TIBIA FRACTURE SURGERY     ??? TONSILLECTOMY      + adenoids       Family History     Family History   Problem Relation Age of Onset   ??? Kidney failure Mother    ??? Aneurysm Mother        Social History     Social History     Social History   ??? Marital status: Divorced     Spouse name: N/A   ??? Number of children: N/A   ??? Years of education: N/A     Occupational History   ??? Not on file.  Social History Main Topics   ??? Smoking status: Never Smoker   ??? Smokeless tobacco: Never Used   ??? Alcohol use No   ??? Drug use: No   ??? Sexual activity: Not on file     Other Topics Concern   ??? Not on file     Social History Narrative   ??? No narrative on file       Medications     Allergies:  Allergies   Allergen Reactions   ??? Acetaminophen Anaphylaxis     Unknown   ??? Aspirin Anaphylaxis     Unknown   ??? Blue Dye Anaphylaxis     Unknown   ??? Epinephrine Anaphylaxis   ??? Flecainide Shortness Of Breath     Itching, decreased libido, wheezing, flushing, numbness.    ??? Gabapentin Anaphylaxis   ??? Iodinated Contrast- Oral And Iv Dye Anaphylaxis   ??? Isopropyl Alcohol Anaphylaxis   ??? Latex Anaphylaxis and Rash   ??? Lidocaine Anaphylaxis   ??? Nsaids (Non-Steroidal Anti-Inflammatory Drug) Anaphylaxis   ??? Onion Anaphylaxis   ??? 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   ??? Perfume Anaphylaxis   ??? Pregabalin Anaphylaxis   ??? Quetiapine Anaphylaxis     Mental  Status Change   ??? Red Dye Anaphylaxis     Unknown   ??? Sulfur Anaphylaxis     Pt states: Any alcohol product   ??? Yellow Dye Anaphylaxis     Unknown   ??? Doxepin Other (See Comments)     Unknown   ??? Duloxetine      Other reaction(s): Other (See Comments)  Mental Status Change   ??? Epi E-Z Pen    ??? Hydroxyzine Itching     Unknown   ??? Levofloxacin Itching   ??? Montelukast    ??? Verapamil (Bulk) Hives     Insomnia, flushing, scattered brain, migraine, itching, anxiety.    ??? Adhesive Tape-Silicones Itching, Rash and Swelling     Unknown   ??? Trazodone Rash       Home Meds:  Prior to Admission medications as of 08/09/16 1139   Medication Sig Taking?   busPIRone (BUSPAR) 10 MG tablet Take 0.5 tablets (5 mg total) by mouth See Admin Instructions. Indications: Generalized Anxiety Disorder    cromolyn (GASTROCROM) 100 mg/5 mL solution Take 10 ml by mouth 3 times daily    diphenhydrAMINE (BENADRYL) 50 MG tablet Take 50 mg by mouth 3 times a day.    EPINEPHrine 1 mg/mL (1 mL) Soln injection Inject into the muscle.    fexofenadine (ALLEGRA) 180 MG tablet Take 1 tablet (180 mg total) by mouth daily.    furosemide (LASIX) 20 MG tablet Take 1 tablet (20 mg total) by mouth daily.    iron dextran complex (INFED) 100 mg/2 mL (50 mg/mL) Soln injection Inject into the vein.    levalbuterol (XOPENEX) 1.25 mg/3 mL nebulizer solution Inhale 3 mL (1.25 mg total) by nebulization every 6 hours as needed for Wheezing.    levothyroxine (SYNTHROID, LEVOTHROID) 112 MCG tablet Take one tablet by mouth once daily    montelukast (SINGULAIR) 10 mg tablet Take 1 tablet (10 mg total) by mouth at bedtime.    nebulizer and compressor [employer]  Use 1 ampule as directed every 4 hours. Indications: Please dispense 1 nebulizer unit    omeprazole (PRILOSEC) 40 MG capsule Take 40 mg by mouth.    onabotulinumtoxinA (BOTOX COSMETIC) 50 unit SolR Inject into the muscle.    ranitidine (ZANTAC) 300 MG tablet Take 1 tablet (300 mg total) by mouth 3 times a day.     triamcinolone (KENALOG) 0.1 % ointment Apply topically 2 times a day.        Inpatient Meds:  Scheduled:   Continuous:     PRN: dexamethasone, fentaNYL **OR** fentaNYL **OR** fentaNYL **OR** fentaNYL **OR** fentaNYL, HYDROmorphone **OR** HYDROmorphone **OR** HYDROmorphone **OR** HYDROmorphone **OR** HYDROmorphone, naloxone, ondansetron, oxyCODONE **OR** oxyCODONE, peppermint oil, proMETHazine    Vital Signs     Wt Readings from Last 3 Encounters:   08/05/16 179 lb (81.2 kg)   07/28/16 182 lb 9.6 oz (82.8 kg)   07/22/16 168 lb (76.2 kg)     Ht Readings from Last 3 Encounters:   08/05/16 5' 7 (1.702 m)   07/28/16 5' 7 (1.702 m)   07/22/16 5' 7 (1.702 m)     Temp Readings from Last 3 Encounters:   08/05/16 99.1 ??F (37.3 ??C) (Oral)   06/17/16 98.2 ??F (36.8 ??C)   06/04/16 98.5 ??F (36.9 ??C) (Oral)     BP Readings from Last 3 Encounters:   08/05/16 134/78   07/28/16 129/87   07/22/16 137/79     Pulse Readings from Last 3 Encounters:   08/05/16 102   07/28/16 89   07/22/16 81       Physical Exam     Airway:     Mallampati: I  Mouth Opening: >2 FB  TM distance: > = 3 FB  Neck ROM: full  (-) no facial hair, neck not short      Dental:            Pulmonary:       Breath sounds clear to auscultation.     (-) no rhonchi, no decreased breath sounds, no wheezes and no PE.    Cardiovascular:     Rhythm: regular  Rate: normal  (-) murmur.    Neuro/Musculoskeletal/Psych:    Mental status: alert and oriented to person, place and time.    No sensory deficit.        Abdominal:       Current OB Status:       Other Findings:        Laboratory Data     Lab Results   Component Value Date    WBC 8.6 06/04/2016    HGB 13.9 06/04/2016    HCT 43.7 06/04/2016    MCV 88.6 06/04/2016    PLT 310 06/04/2016       No results found for: [facility]    Lab Results   Component Value Date    GLUCOSE 150 (H) 06/04/2016    BUN 13 06/04/2016    CO2 23 06/04/2016    CREATININE 0.81 06/04/2016    K 3.7 06/04/2016    NA 141 06/04/2016    CL 107 06/04/2016     CALCIUM 8.4 (L) 06/04/2016       No results found for: PTT, INR    No results found for: PREGTESTUR, PREGSERUM, HCG, HCGQUANT    Anesthesia Plan     ASA 3       Female, current non-smoker and opiate use  Planned PONV prophylaxis.    Anesthesia Type:  general.     (  Plan to give IV pepcid, benadryl, and decadron given Hx of multiple allergies and that patient seems prone to angiodema and/or contact dermatitis.)    Intravenous induction.    Anesthetic plan and risks discussed with patient.    Plan, alternatives, and risks of anesthesia, including death, have been explained to and discussed with the patient/legal guardian.  By my assessment, the patient/legal guardian understands and agrees.  Scenario presented in detail.  Questions answered.    Use of blood products discussed with patient whom consented to blood products.   Plan discussed with CRNA and RNSA.

## 2016-08-17 NOTE — Unmapped (Signed)
Pt met pacu discharge criteria, alert/oriented, rates pain 3-4/10, tolerating po intake.

## 2016-08-17 NOTE — Unmapped (Signed)
UPDATED BRIEF H&P    Patient seen in pre-op holding area  No changes from prior H&P  Patient denies any changes in medical status  Site marked and consent obtained/verified  Plans discussed and questions answered  To OR when ready    See previous EPIC notes for full details    Willia Craze MD  ORTHOPEDIC SURGERY  08/17/2016 6:48 AM

## 2016-08-17 NOTE — Unmapped (Signed)
REMOVAL OF HARDWARE LEFT TIBIA  Procedure Note    Emily Bonilla  08/17/2016      Pre-op Diagnosis: Painful orthopaedic hardware (CMS Dx) [Z61.09UE]       Post-op Diagnosis: same    Procedure(s):  REMOVAL OF HARDWARE LEFT TIBIA      Surgeon(s):  Andee Lineman, MD    Anesthesia: General    Staff:   Circulator: Darnell Level, RN  Scrub Person: Theresa Mulligan, RN  Assistant: Octaviano Batty, CST  Resident: Lawson Fiscal, MD; Luz Lex Crist Infante, MD    Estimated Blood Loss: Minimal                 Specimens:            Drains:        There were no complications unless listed below.        Emily Bonilla     Date: 08/17/2016  Time: 8:26 AM

## 2016-08-17 NOTE — Unmapped (Signed)
When administering night meds to pt, pt stated multiple meds were not included or the same as her current regimen. Pt stated it's very important for her to stay with her medication regimen due to her mast cell disease. Ortho team paged due to her buspar being 5 mg twice a day. Pt states she receives 35 mg total of buspar per day, with 10mg  10 mg and then 15 mg at night. A one time dose of 10 mg added for the night, and per ortho resident medications will be adjusted during the day. Ortho resident did come to bedside and talk with patient about medications and added protonix. Will continue to monitor pt.

## 2016-08-17 NOTE — Unmapped (Signed)
[facility]                      [facility]     PATIENT NAME:   Emily Bonilla, Emily Bonilla              MRN: [id number]  DATE OF BIRTH:  Jul 06, 1960                     CSN: [id number]  SURGEON:        Andee Lineman, M.D.       ADMIT DATE: 08/17/2016  SERVICE:        Orthopaedic Surgery and Sports Med  DICTATED BY:    Andee Lineman, M.D.       SURGERY DATE: 08/17/2016                                    OPERATIVE REPORT     PREOPERATIVE DIAGNOSIS(ES):  Mechanical complication of internal orthopedic  device, implant, or graft (painful and inflammatory medial tibia fixation  hardware left lower leg); ICD-9 code 996.40.     POSTOPERATIVE DIAGNOSIS(ES):  Mechanical complication of internal orthopedic  device, implant, or graft (painful and inflammatory medial tibia fixation  hardware left lower leg); ICD-9 code 996.40.     PROCEDURE(S) PERFORMED:  Removal of implant deep (left medial tibial plate  and screws); CPT code 78295.     ATTENDING SURGEON:  Andee Lineman, M.D.     ASSISTANT(S):  Willey Blade, M.D.; Tawanna Solo, M.D.     ANESTHESIA:  General via laryngeal mask airway.     ESTIMATED BLOOD LOSS:  Less than 100 mL.     COMPLICATION(S):  None.     CLINICAL NOTE:  This is a 56 year old female patient who has significant  severe mast cell disease who presented to my office with hardware in her  medial tibia which was causing an inflammatory reaction and pain.  She wanted  the hardware removed.  We discussed in detail the risks, benefits, and  alternative procedures of operative versus nonoperative treatment.  I gave  her the opportunity to ask questions.  I answered all of her questions.  She  signed informed surgical consent and was scheduled and taken to surgery.     DETAILS OF PROCEDURE(S):  The patient was taken to the operating room.  General anesthesia was induced via a laryngeal mask airway.  The left lower  extremity was prepped and draped in a  sterile fashion.  The patient received  1 gram IV cefazolin.  A formal timeout procedure was then performed,  confirming the appropriate and correct left lower extremity and informed  consent document.     The previous distal medial incision of the tibia was carried through the skin  and subcutaneous tissue.  Dissection was taken down to the level of the  plate, exposing the distal aspect of the plate.  Six screws were removed from  the plate.  Proximally four screws were removed percutaneously under  fluoroscopic guidance.  The plate itself was removed.  A single  anterior/posterior lag screw was removed through the distal incision.  AP and  lateral fluoroscopic radiographs were obtained, demonstrating complete  removal of all hardware with a stable healed fracture.  The wounds were  copiously irrigated and closed in layers, the subcutaneous layers with  inverted 3-0 PDS suture and the skin with a 3-0  nylon simple suture.  Sterile  compressive dressings were applied.  The patient tolerated the procedure well  and was taken to the PACU in stable condition.  There were no complications.     I was the attending orthopedic surgeon for this procedure.  I was present for  the key and critical portions of the operation which included removal of  hardware from the left lower leg.                                                Andee Lineman, M.D.  MA/jlq  D:  08/17/2016 08:22  T:  08/17/2016 08:54  Job #:  [id number]     c:   Andee Lineman, M.D.          Willey Blade, M.D.          Tawanna Solo, M.D.     OPERATIVE REPORT                                             PAGE    1 of   1

## 2016-08-17 NOTE — Unmapped (Signed)
Pt admitted into PACU bay 9 from the OR, protocols placed, report from anesthesia at the bedside. Pt asleep. Vitals WDL on 3lpm O2 via NC.  Will continue to monitor.

## 2016-08-17 NOTE — Unmapped (Signed)
Anesthesia Transfer of Care Note    Patient: Emily Bonilla  Procedure(s) Performed: Procedure(s):  REMOVAL OF HARDWARE LEFT TIBIA    Patient location: PACU    Anesthesia type: general    Airway Device on Arrival to PACU/ICU: Nasal Cannula    IV Access: Peripheral    Monitors Recommended to be Used During PACU/ICU: Standard Monitors    Outstanding Issues to Address: None    Level of Consciousness: awake and alert     Post vital signs:    Vitals:    08/17/16 0835   BP: 163/79   Pulse: 93   Resp: 20   Temp: 97.7 ??F (36.5 ??C)   SpO2: 100%       Complications: None      Date 08/16/16 0700 - 08/17/16 0659(Not Admitted) 08/17/16 0700 - 08/18/16 0659   Shift 0700-1459 1500-2259 2300-0659 24 Hour Total 0700-1459 1500-2259 2300-0659 24 Hour Total   I  N  T  A  K  E   I.V.     250  (3.3)   250  (3.3)      Volume (mL) (lactated Ringers infusion)     250   250    Shift Total  (mL/kg)     250  (3.3)   250  (3.3)   O  U  T  P  U  T   Shift Total  (mL/kg)           Weight (kg)     74.8 74.8 74.8 74.8

## 2016-08-18 ENCOUNTER — Encounter: Payer: MEDICARE | Attending: Speech-Language Pathologist

## 2016-08-18 MED ORDER — busPIRone (BUSPAR) tablet 10 mg
10 | Freq: Three times a day (TID) | ORAL | Status: AC
Start: 2016-08-18 — End: 2016-08-18

## 2016-08-18 MED ORDER — diphenhydrAMINE (BENADRYL) injection 25 mg
50 | Freq: Four times a day (QID) | INTRAMUSCULAR | Status: AC | PRN
Start: 2016-08-18 — End: 2016-08-19
  Administered 2016-08-18 – 2016-08-19 (×3): 25 mg via INTRAVENOUS

## 2016-08-18 MED ORDER — busPIRone (BUSPAR) tablet 15 mg
5 | Freq: Every evening | ORAL | Status: AC
Start: 2016-08-18 — End: 2016-08-19
  Administered 2016-08-19: 01:00:00 15 mg via ORAL

## 2016-08-18 MED ORDER — busPIRone (BUSPAR) tablet 10 mg
10 | Freq: Two times a day (BID) | ORAL | Status: AC
Start: 2016-08-18 — End: 2016-08-19
  Administered 2016-08-18 – 2016-08-19 (×2): 10 mg via ORAL

## 2016-08-18 MED ORDER — busPIRone (BUSPAR) tablet 5 mg
5 | Freq: Once | ORAL | Status: AC
Start: 2016-08-18 — End: 2016-08-18
  Administered 2016-08-18: 16:00:00 5 mg via ORAL

## 2016-08-18 MED FILL — HYDROMORPHONE 0.5 MG/0.5 ML INJECTION SYRINGE: 0.5 0.5 mg/0.5 mL | INTRAMUSCULAR | Qty: 0.5

## 2016-08-18 MED FILL — PANTOPRAZOLE 40 MG TABLET,DELAYED RELEASE: 40 40 MG | ORAL | Qty: 2

## 2016-08-18 MED FILL — DIPHENHYDRAMINE 50 MG/ML INJECTION SOLUTION: 50 50 mg/mL | INTRAMUSCULAR | Qty: 1

## 2016-08-18 MED FILL — BUSPIRONE 5 MG TABLET: 5 5 MG | ORAL | Qty: 1

## 2016-08-18 MED FILL — LEVOTHYROXINE 112 MCG TABLET: 112 112 MCG | ORAL | Qty: 1

## 2016-08-18 MED FILL — FAMOTIDINE 20 MG TABLET: 20 20 MG | ORAL | Qty: 2

## 2016-08-18 MED FILL — BUSPIRONE 5 MG TABLET: 5 5 MG | ORAL | Qty: 3

## 2016-08-18 MED FILL — SENNOSIDES 8.6 MG-DOCUSATE SODIUM 50 MG TABLET: 8.6-50 8.6-50 mg | ORAL | Qty: 1

## 2016-08-18 MED FILL — BUSPIRONE 10 MG TABLET: 10 10 MG | ORAL | Qty: 1

## 2016-08-18 MED FILL — MONTELUKAST 10 MG TABLET: 10 10 mg | ORAL | Qty: 1

## 2016-08-18 MED FILL — DIPHENHYDRAMINE 25 MG CAPSULE: 25 25 mg | ORAL | Qty: 2

## 2016-08-18 MED FILL — ENOXAPARIN 30 MG/0.3 ML SUBCUTANEOUS SYRINGE: 30 30 mg/0.3 mL | SUBCUTANEOUS | Qty: 0.3

## 2016-08-18 NOTE — Progress Notes (Signed)
Ortho Nurse Clinician Note:      Chart Reviewed.    Diagnosis/Activity/WBS:s/p ROH L tibia. OOBAT WBAT LLE.    Assessment: Pt sitting in bed with no s/s of distress. Had long conversation with pt on her history of severe mast cell disease. Discussed patients pain stating she is only able to take IV pain medication because of the dyes in oral medications. Pt requesting to stay one more night due to pain. Pt states when she is discharged she will have no pain medication and will rely strictly on meditation. She would like to have that pain somewhat under control before she is discharged. Pt is reasonable with her requests. Requesting IV benadryl with her pain medication. States her throat feels a little swollen. Requesting to be seen by PTOT to have DME brought to her. Ace noted c/d/I. Adequate movement and sensation, palpable pulses, good pedal/dorsal flexion.       Wound Care:Keep c/d/I. Sponge bathe only.      Ortho plan of care:OR plans complete. Continue pain management. Pt will discharge without pain medication. Continue to wean down the IV dilaudid. PTOT needed for DME assessment.       Educated pt on importance of activity at discharge to decrease risk for complications. Educated pt on side effects of narcotics. Appropriate bowel reg ordered. Educated pt on use of IS. Encouraged ambulation, ice, stool softeners, and IS. Discussed WBS, and wound care. Pt verbalized understanding.    DVT prophylaxis/Anticoagulation:Lovenox 30 mg BID while in house. Will not discharge with chemical ppx due to her allergy to ASA and he WBAT status.     PTOT Recs:Awaiting Recs      Dispo planning:Dispo home tomorrow pending further pain management.    Patient is agreeable to plan of care.     Follow up has been scheduled withDr Archdeacon's PA 8.16 2:30 MAB    Patient has no further questions at this time. Will continue to follow.    Shari Heritage RN  Pager: [phone]  Office: [phone]

## 2016-08-18 NOTE — Unmapped (Signed)
Social Worker notified by team that when patient is medically ready for discharge, patient will require a rolling walker, 3in1, tub chair, and ramp for 4 steps.   SW to work on securing patient's needed equipment prior to discharge.     SW will continue to follow.    Josephina Gip, MSW, [place]  [phone]

## 2016-08-18 NOTE — Unmapped (Signed)
ORTHOPAEDIC SURGERY PROGRESS NOTE    Subjective: Seen this morning. No complaints. Pain well controlled. Denies numbness/tingling     Objective:  Temp:  [97 ??F (36.1 ??C)-97.8 ??F (36.6 ??C)] 97.7 ??F (36.5 ??C)  Heart Rate:  [70-100] 75  Resp:  [9-22] 16  BP: (114-164)/(49-85) 114/49  FiO2:  [62 %-100 %] 97 %    General: NAD  CV/Pulm: Breathing unlabored  Abd: Soft  MSK:    LLE  Dressings c/d/i  SILT sp/dp/t/sural/saphenous  EHL/TA/GS intact  Toes up and down  Cap refill < 2 sec    Assessment:  Emily Bonilla is a 56 y.o. female with:  s/p ROH L tibia    Plan:  - Overnight observation for history of severe postop allergic reaction  - WBS: WBAT  - Diet: regular  - ABX: Ancef for 24 hours  - DVT Ppx: SCDs, Lovenox  - Ice, elevation, pain control  - Dispo: no PT OT, home today    Emily Mineau Crist Infante, MD  Orthopaedic Surgery Resident

## 2016-08-18 NOTE — Unmapped (Signed)
CMU reconciliation completed for patient, Emily Bonilla    MIR Sanjuana Letters, RN

## 2016-08-18 NOTE — Unmapped (Signed)
Central monitoring remote orders reconciliation.    No active CMU SpO2 monitoring orders in EPIC.     Patient currently being monitored for SpO2 on box [id number].    Irving Burton RN notified by telephone conversation.    Sherrilyn Rist RN - Central Monitoring Unit

## 2016-08-18 NOTE — Progress Notes (Signed)
Inpatient Physical Therapy   Initial Assessment    Name: Emily Bonilla  DOB:01/13/1961  Attending Physician: Andee Lineman, MD  Admitting Diagnosis: Painful orthopaedic hardware (CMS Dx) [G95.62ZH]  Date: 08/18/2016  Room: [phone]    Hospital Course PT/OT: 56 yo F with s/p ROH L tibia 7/31  Precautions: WBAT LLE    Activity level: activity as tolerated      Assessment:  Patient presents with impairments including activity tolerance and gait.   Ms K required CGA for sit to stand and to ambulate in the hallway using a rolling walker.    Recommendations:   Recommendation: Other (Comment) (home with prn assist)    AM-PAC 6 Clicks Basic Mobility Inpatient Short Form: PT 6 Clicks Score: 21     Equipment Recommended: Rolling Walker        Mobility Recommendations for Staff:  Patient ambulates in hallway with 1 person assist requires use of rolling walker during activity has WBAT LLE precautions      Preadmission Environment:  Patient lives alone and has some (not 24 hour) assistance available at discharge.    Patient lives in a house.   4 STE    Patient has the following equipment: no assistive device, Life Line      Prior Level of Function:  Information per patient:  --Patient was using no assistive device for functional mobility.  --Prior to hospital admission the patient needed assistance with requires assist at time d/t her allergies/medical condition   --Patient received assistance from home health aide.      Present Cognitive Status:  Patient is oriented X 4.  Patient is alert, appropriate and cooperative.  Patient is able to follow all commands.       Patient Comments:  I have a baby bump      Pain:  Patient denies pain      Vision/Perception:  Not formally tested  Grossly WFL      Upper Extremity Function:  Not tested- defer to OT      Lower Extremity Function:  Strength/ROM grossly WFL . L foot ankle NT    Sensation: intact bilaterally    Muscle Tone: normal    Motor Control: normal      Edema:    abdomen      Functional Mobility:  Bed mobility = Patient transitions from supine to sit independently  Sit to stand = Patient transfers from sit to stand with contact guard assistance  Stand to sit = Patient transfers from stand to sit with contact guard assistance  Gait = Patient ambulates ~90 ft. with contact guard assistance using rolling walker . Gait deviations include slow cadence, decreased B step length.  Pt required 1 seated rest break d/t going into anaphylaxis..      Balance:  Static sitting = performs independently  Dynamic sitting = performs independently  Static standing = performs with contact guard assistance using rolling walker  Dynamic standing = performs with contact guard assistance using rolling walker      Positioning:  Patient left in bed at end of session, call light/ needs left within reach. Bed alarm unchanged from previous setting.       Goals:  To be met by: 08/25/16    Sit to stand = Patient will transfer from sit to stand with supervision  Gait = Patient will ambulate 50 ft. with supervision using rolling walker    Long-term goal =STG  Patient stated goals: to go home    Rehabilitation Potential (  for above goals): good   Strengths: motivation    Barriers: medical conditions    Above goals discussed with patient -- Yes.       Patient/Family Education:  Educated patient on the role of physical therapy, goals, plan of care, importance of increased activity and discharge recommendations and fall prevention strategies, including use of call light; patient verbalized understanding and demonstrated understanding.       Plan:  Patient to be seen at least 3  time(s) per week to address above deficits with functional mobility, therapeutic activity, gait training and patient/family education.    The plan of care and recommendations assesses the patient's and/or caregiver's readiness, willingness, and ability to provide or support functional mobility and ADL tasks as needed upon  discharge.      Signed:    Hal Morales PT  [facility]  [phone]  Office  [phone]  pager  Hours 6:00-3:30 M-W        Patient Class: Observation    G-Code Primary Functional Limitation:    Visit #: 1 (Eval),  G-CODE: mobility walking/moving (selection based on gait assessment)  Current Status: CI (1-20% impaired, limited, or restricted) and Goal status: CI (1-20% impaired, limited, or restricted)    Start Time: 1342    Stop Time: 1421   Time Calculation (min): 39 min    Units Rendered:   $PT Evaluation Mod Complex 30 Min: 1 Procedure  $Gait/Mobility: 8-22 mins                PMH:   Past Medical History:   Diagnosis Date    Anaphylaxis     Anemia     Asthma     Atrial fibrillation (CMS Dx)     Bronchitis     Heart disease     Neurological disease     OSA (obstructive sleep apnea)     states resolved    Osteoporosis     Spontaneous pneumothorax     states pneumomediatsium/pneumopericardium, self resolved    Thyroid disease     Ulcer (CMS Dx)     WPW (Wolff-Parkinson-White syndrome)      PSH:   Past Surgical History:   Procedure Laterality Date    BONE MARROW BIOPSY      benign    CARDIAC ELECTROPHYSIOLOGY MAPPING AND ABLATION      attempted x 2, states not successful either time    CHOLECYSTECTOMY      FACIAL COSMETIC SURGERY      HYSTERECTOMY      LIVER BIOPSY      benign hepatic adednoma per patient    REMOVAL HARDWARE LEG Left 08/17/2016    Procedure: REMOVAL OF HARDWARE LEFT TIBIA;  Surgeon: Andee Lineman, MD;  Location: [facility] OR;  Service: Orthopedics;  Laterality: Left;    TIBIA FRACTURE SURGERY      TONSILLECTOMY      + adenoids

## 2016-08-18 NOTE — Unmapped (Signed)
Central monitoring remote orders reconciled.    Sherrilyn Rist RN - Central Monitoring Unit

## 2016-08-18 NOTE — Unmapped (Signed)
MOON notice given to pt. Copy signed by pt and put in chart.

## 2016-08-19 MED FILL — ENOXAPARIN 30 MG/0.3 ML SUBCUTANEOUS SYRINGE: 30 30 mg/0.3 mL | SUBCUTANEOUS | Qty: 0.3

## 2016-08-19 MED FILL — DIPHENHYDRAMINE 25 MG CAPSULE: 25 25 mg | ORAL | Qty: 2

## 2016-08-19 MED FILL — DIPHENHYDRAMINE 50 MG/ML INJECTION SOLUTION: 50 50 mg/mL | INTRAMUSCULAR | Qty: 1

## 2016-08-19 MED FILL — SENNOSIDES 8.6 MG-DOCUSATE SODIUM 50 MG TABLET: 8.6-50 8.6-50 mg | ORAL | Qty: 1

## 2016-08-19 MED FILL — PANTOPRAZOLE 40 MG TABLET,DELAYED RELEASE: 40 40 MG | ORAL | Qty: 2

## 2016-08-19 MED FILL — LEVOTHYROXINE 112 MCG TABLET: 112 112 MCG | ORAL | Qty: 1

## 2016-08-19 MED FILL — HYDROMORPHONE 0.5 MG/0.5 ML INJECTION SYRINGE: 0.5 0.5 mg/0.5 mL | INTRAMUSCULAR | Qty: 0.5

## 2016-08-19 MED FILL — FAMOTIDINE 20 MG TABLET: 20 20 MG | ORAL | Qty: 2

## 2016-08-19 NOTE — Unmapped (Signed)
[facility]  Department of Orthopaedic Surgery  Discharge Summary    Patient ID:  Emily Bonilla  56 y.o.  [id number]     Date of Admission: 08/17/2016  Date of Discharge: 08/19/2016    Attending Surgeon: No att. providers found    Discharge Diagnoses:  Patient Active Problem List   Diagnosis   ??? WPW syndrome   ??? Mast cell activation syndrome (CMS Dx)   ??? Multiple allergies   ??? Syncope and collapse   ??? Anxiety and depression   ??? Painful orthopaedic hardware (CMS Dx)   ??? History of asthma   ??? Thyroid disease       Operations Performed: ROH L tibia    Consultations: PT/OT, SW    Allergies:  Allergies   Allergen Reactions   ??? Acetaminophen Anaphylaxis     Unknown   ??? Aspirin Anaphylaxis     Unknown   ??? Blue Dye Anaphylaxis     Unknown   ??? Epinephrine Anaphylaxis   ??? Flecainide Shortness Of Breath     Itching, decreased libido, wheezing, flushing, numbness.    ??? Gabapentin Anaphylaxis   ??? Iodinated Contrast- Oral And Iv Dye Anaphylaxis   ??? Isopropyl Alcohol Anaphylaxis   ??? Latex Anaphylaxis and Rash   ??? Lidocaine Anaphylaxis   ??? Nsaids (Non-Steroidal Anti-Inflammatory Drug) Anaphylaxis   ??? Onion Anaphylaxis   ??? 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   ??? Perfume Anaphylaxis   ??? Pregabalin Anaphylaxis   ??? Quetiapine Anaphylaxis     Mental Status Change   ??? Red Dye Anaphylaxis     Unknown   ??? Sulfur Anaphylaxis     Pt states: Any alcohol product   ??? Yellow Dye Anaphylaxis     Unknown   ??? Doxepin Other (See Comments)     Unknown   ??? Duloxetine      Other reaction(s): Other (See Comments)  Mental Status Change   ??? Epi E-Z Pen    ??? Hydroxyzine Itching     Unknown   ??? Levofloxacin Itching   ??? Montelukast     ??? Verapamil (Bulk) Hives     Insomnia, flushing, scattered brain, migraine, itching, anxiety.    ??? Adhesive Tape-Silicones Itching, Rash and Swelling     Unknown   ??? Trazodone Rash       Discharge Medication:     Medication List      TAKE these medications, which you were ALREADY TAKING      Quantity/Refills   busPIRone 10 MG tablet  Commonly known as:  BUSPAR  Take 0.5 tablets (5 mg total) by mouth See Admin Instructions. Indications: Generalized Anxiety Disorder   Quantity:  113 tablet  For:  Generalized Anxiety Disorder  Refills:  0     cromolyn 100 mg/5 mL solution  Commonly known as:  GASTROCROM  Take 10 ml by mouth 3 times daily   Quantity:  960 mL  Refills:  0     diphenhydrAMINE 50 MG tablet  Commonly known as:  BENADRYL  Take 50 mg by mouth 3 times a day.   Refills:  0     fexofenadine 180 MG tablet  Commonly known as:  ALLEGRA  Take 1 tablet (180 mg total) by mouth daily.   Quantity:  30 tablet  Refills:  3     furosemide 20 MG tablet  Commonly known as:  LASIX  Take 1 tablet (20 mg total) by mouth daily.   Quantity:  180 tablet  Refills:  1     levalbuterol 1.25 mg/3 mL nebulizer solution  Commonly known as:  XOPENEX  Inhale 3 mL (1.25 mg total) by nebulization every 6 hours as needed for Wheezing.   Quantity:  72 mL  Refills:  0     levothyroxine 112 MCG tablet  Commonly known as:  SYNTHROID, LEVOTHROID  Take one tablet by mouth once daily   Quantity:  90 tablet  Refills:  3     montelukast 10 mg tablet  Commonly known as:  SINGULAIR  Take 1 tablet (10 mg total) by mouth at bedtime.   Quantity:  90 tablet  Refills:  1     nebulizer and compressor Devi  Use 1 ampule as directed every 4 hours. Indications: Please dispense 1 nebulizer unit   Quantity:  1 each  For:  Please dispense 1 nebulizer unit  Refills:  0     omeprazole 40 MG capsule  Commonly known as:  PRILOSEC  Take 40 mg by mouth.   Refills:  0     onabotulinumtoxinA 50 unit Solr  Commonly known as:  BOTOX COSMETIC  Inject into the muscle.    Refills:  0     ranitidine 300 MG tablet  Commonly known as:  ZANTAC  Take 1 tablet (300 mg total) by mouth 3 times a day.   Quantity:  90 tablet  Refills:  11     triamcinolone 0.1 % ointment  Commonly known as:  KENALOG  Apply topically 2 times a day.   Quantity:  30 g  Refills:  3        STOP taking these medications    EPINEPHrine 1 mg/mL (1 mL) Soln injection     iron dextran complex 100 mg/2 mL (50 mg/mL) Soln injection            Reason for Admission: Emily Bonilla is a 56 y.o. female admitted for [facility] L tibia     Hospital Course: The patient was admitted to the orthopaedic surgery floor after undergoing ROH L tibia. The post-operative course was uncomplicated.  The patient's diet was advanced as tolerated. The patient's pain was well controlled with oral and IV medications. The patient was seen and evaluated by PT/OT who recommended home. The patient was discharged to Home in stable condition.    Condition on Discharge: stable      Disposition:  Discharge to home  Pain control    Discharge Instructions:  ACTIVITY:  As tolerated, WBAT LLE; OOB TID @ minimum, ice/elevate PRN  DIET:  Regular diet   MEDICATIONS:  Take all medications as prescribed. Do NOT drink alcohol, drive or operate heavy machinery while taking narcotic pain medications (e.g., Oxycodone, Percocet, Vicodin, Norco, etc).  Do not take additional Acetaminophen (Tylenol) products while taking combination medications like Oxycodone/acetaminophen (Percocet) or Hydrocodone/acetaminophen (Vicodin, Norco).  OK to take Acetaminophen (Tylenol) if taking plain Oxycodone (Roxicodone).  Do not exceed 3000 mg Acetaminophen (Tylenol) in 24 hours.                                  WOUND CARE:  Dry sterile dressing  changes daily prn if soiled/saturated.  No soaking in a tub.  Monitor for signs/symptoms of infection - including fever >101.5 F, redness, warmth and increased pain & swelling.  SPINE STATUS:  Clear  DVT PROPHYLAXIS:   Lovenox 30 mg BID while in  house. Will not discharge with chemical ppx due to her allergy to ASA and WBAT status. Encourage OOB and SCDs.   ANTIBIOTICS:  n/a  FOLLOW UP:  Future Appointments  Date Time Provider Department Center   08/26/2016 2:45 PM Jonelle Sidle, MD [facility] PSY OP OP   09/02/2016 2:30 PM ORTHO PA, [facility] ORTH MAB MAB   09/16/2016 9:40 AM Casimer Leek [place], DO [facility] ALG HOX HOX   10/08/2016 10:30 AM Court Joy, MD [facility] HOX HOX     Andee Lineman, MD  [address]  [address]  [place] [address]  [phone]    On 09/02/2016  Please arrive at 2:00 for your 2:30 appointment with the ortho PA     Court Joy, MD  [address]  [place] [address]  [phone]            Andi Devon, MD  Orthopaedic Surgery Resident  08/19/2016 7:33 PM

## 2016-08-19 NOTE — Unmapped (Signed)
ORTHOPAEDIC SERVICE DISCHARGE INSTRUCTIONS    ORTHOPAEDIC HOTLINE:  [phone]  ORTHOPAEDIC FAX:  [phone]    *For questions please call the Orthopaedic Hotline and leave a message.*  If your call is between the hours of 7:00 AM - 3:00 PM every day, an Orthopaedic Nurse will return your call.        For emergencies after 3:00 PM and on major holidays, please call the [facility] at [phone] and ask the operator to page the Orthopaedic Resident on call or return to an Emergency Department.    Call the Orthopaedic Hotline or Return to an Emergency Department for:  1) Fever greater than 101.5??                                      2) Persistent nausea & vomiting                               3) Shortness of breath                  4) Persistent or increasing unrelieved pain  5) Foul smelling drainage from wounds/surgical sites  6) Increased redness, swelling, or warmth at wound/surgical sites.  7) Other:    INJURY/CONDITION INFORMATION: removal of hardware left tibia 08/17/16   Date of Injury:    DIET:  [x]  Regular    []  Special diet:    ACTIVITY:      [x]  As tolerated    []  Shower    [x]  Sponge bathe to keep dressing(s) dry    []  Do not lift more than:    []  0 pounds  []  5 pounds  []  10 pounds  []  Other:  []  No pushing, pulling, lifting or carrying       []  No excessive bending or twisting  []  Other:    RESTRICTIONS:   Left leg:    [x]  Weight bear as tolerated     []  Knee immobilizer  []  Toe touch weight bear     []  Non-weight bearing    WOUND CARE:   [x]  Leave sutures/staples/steri-strips in place  [x]  Keep ace wrap/splint in place until follow-up visit  []  May shower and wash wound daily                                      []  Dressing changes:  []  Other:      PATIENT/FAMILY TEACHING:  [x]  Return to work/school on:                   Or [x]  to be determined at follow-up                   [x]  Return to driving:                                 Or [x]  to be determined at follow-up             [x]  Pain  management - ice and elevation    [x]  Incentive spirometer and coughing 10 times each hour while awake  []  PICC line care:  []  Vaccinations given:   []  Tetanus:  []  Influenza (Flu):  []  Pneumovax (  Pneumonia):  []  Diabetes information - see attached  []  Other:    MEDICATIONS:   []  Do not take Aspirin or Ibuprofen/NSAID (Advil, Motrin, Aleve, Naprosyn, Mobic, Celebrex) products while taking Warfarin (Coumadin).   []  Do not take Ibuprofen/NSAID (Advil, Motrin, Aleve, Naprosyn, Mobic, Celebrex) products unless ok'd by Orthopaedics physician.  [x]  OK to take Ibuprofen/NSAID (Advil, Motrin, Aleve, Naprosyn, Mobic, Celebrex) products when taking Acetaminophen (Tylenol).     -Do not exceed 1200 mg total or 400 mg 3 times/day Ibuprofen in 24 hours unless prescribed more by a physician. 2 tablets of over the counter 200 mg strength Ibuprofen equals 400 mg.     -Always take with a full glass of water and with food.  []  Please take prescribed Pepcid while taking 14 day course of Aspirin and Ibuprofen or other NSAIDS.  []  Do not take Ibuprofen or other NSAIDs while taking 14 day course of Aspirin unless okay'd by Orthopaedics physician  [x]  OK to take Acetaminophen (Tylenol) when taking plain Oxycodone (Roxicodone).    []  Do not take additional Acetaminophen (Tylenol) products while taking combination medications Oxycodone/APAP (Percocets) or Hydrocodone/APAP (Lortab, Vicodin, Norco).    *Do not exceed 3000 mg (9 tablets of 325 mg strength or 6 tablets of 500 mg strength) Acetaminophen (Tylenol) in 24 hours.                               *Take pain medication as prescribed. Do not drink alcohol, drive or operate heavy machinery while on narcotics.    *[place] law changed in 2017 regarding the prescription of opioid analgesic (narcotic) pain medications.  At discharge you will be provided with a prescription for pain medication that should last until your follow-up appointment with your orthopaedic surgeon.  Based on [place], we  will not be able to refill your pain medication prior to your follow-up visit with your orthopaedic surgeon. For more information regarding recent law changes you may visit:  [email or web address].[email or web address]        DISCHARGE:  [x]  Home  []  Home with 24 hour/day assistance  []  Other:    [x]  Equipment        Company:       Phone Number:  []  Crutches    [x]  3in1 commode, tub bench- prescriptions provided     []  Abduction pillow/brace  []  Polar pack     [x]  Walker: rolling    [] CPM: Start at 0-40?? of flexion, increase by 5-10?? per day. Wear CPM no longer than 4 hours at a time.  []  Other:

## 2016-08-19 NOTE — Unmapped (Signed)
Contacted Bobbi RN to resolve SpO2 & leads off issues. Per Yvonne Kendall RN,  Discharge pending, ok to discharge the patient from the CMU system and clear sector. Verified discharge order is in place.

## 2016-08-19 NOTE — Unmapped (Signed)
Patient aware of discharge to home.  Discharge instructions given, pt verbalized understanding. PIV removed, core measures completed. Pt belongings gathered pt received correct prescriptions  Wheelchair scheduled for transport to discharge hospitality. Medicare to transport home

## 2016-08-19 NOTE — Care Coordination-Inpatient (Signed)
Social Worker met with patient at bedside to introduce self and explain role. Additionally, SW discussed discharge recommendations with patient, which are to discharge home with a rolling walker. Patient provided with a rolling walker at bedside from [facility]. Additionally, patient signed copy of Medicare letter-copr left with patient and placed in chart.      Patient denies any further questions or concerns at this time.     SW will continue to follow, should any needs arise.     Josephina Gip, MSW, [place]  [phone]

## 2016-08-19 NOTE — Unmapped (Addendum)
Ortho Nurse Clinician Note:     Chart Reviewed.    Diagnosis/Activity/WBS: Pt is s/p ROH L tibia. WBAT LLE.??    Assessment: Pt seen sitting up in bed with no s/s of distress. Pain controlled. LLE ace wrap c/d/I, states MD changed it this morning. Pt comfortable with dc home. States she had her house rearranged for her to easily navigate. Pt states she has 4 stairs to enter her house but will bump up on her bottom to get in. Will have assist at home. Does not feel she needs a ramp. Would like a 3in1 commode and tub bench. Per SW these are not covered, pt aware and okay with prescriptions being provided.     Wound Care: keep dressing c/d/I     Ortho plan of care: OR plans complete. PT eval completed yesterday. Monitor pain. Dispo planning.     DVT prophylaxis/Anticoagulation: Lovenox 30 mg BID while in house. Will not discharge with chemical ppx due to her allergy to ASA and WBAT status. Encourage OOB and SCDs.     PT Recs: Recommendation: Other (Comment) (home with prn assist)  Equipment Recommended: Rolling Walker   OT Recs:       Dispo planning: dc home today. SW obtained pt a walker. Prescriptions provided for additional DME that pt requested.       Patient is agreeable to plan of care. Follow up has been scheduled with Dr. Erskine Speed PA clinic 8/16 at 1430. Information is in the dc navigator.     Patient has no further questions at this time. Will continue to follow.    Cory Munch, RN  Pager #: [phone]

## 2016-08-19 NOTE — Unmapped (Signed)
ORTHOPAEDIC SURGERY PROGRESS NOTE    Subjective: No acute events overnight. Feeling much better this AM after getting her Allegra last night. Pain controlled. Denies paresthesias/numbness.      Objective:  Temp:  [97.5 ??F (36.4 ??C)-98.4 ??F (36.9 ??C)] 97.5 ??F (36.4 ??C)  Heart Rate:  [63-81] 79  Resp:  [16-18] 16  BP: (92-128)/(45-66) 128/65    General: NAD  CV/Pulm: Breathing unlabored  MSK:    LLE  Dressings intact with some saturation over medial and distal aspect   SILT sp/dp/t/sural/saphenous  EHL/TA/GS intact  Wiggles toes up and down  Cap refill < 2 sec    Assessment:  Emily Bonilla is a 56 y.o. female with:  s/p ROH L tibia    Plan:  - Dressing change before discharge  - WBS: WBAT  - Diet: regular  - ABX: Ancef for 24 hours (complete)  - DVT Ppx: SCDs, LMWH  - Ice, elevation, pain control  - No PT/OT  - Anticipate discharge to home today    Willia Craze, MD  Orthopaedic Surgery Resident

## 2016-08-23 NOTE — Unmapped (Signed)
Returned patient's call. Patient is very anxious. Had recent surgery. Home Health did not come until Saturday. Does not yet have a commode. 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 She adamatntly denies SI. Discussed with the patient that there are no sooner appointments than 08/26/16 and that I would not want to make any medication changes until face-to-face evaluation. Discussed PES if the patient develops any safety concerns.    Jonelle Sidle, MD  Psychiatry PGY-3  Pager: [phone]

## 2016-08-23 NOTE — Unmapped (Signed)
Patient is asking for a sooner appointment with Dr. Gerilyn Pilgrim. Patient had a procedure removing 11 screws and 1 titanium plate. Patient was sent home on Thursday and was due to have 25 hours per week of home care and has only received 2 hours since Thursday. Patient is asking for help to maintain and to manage or anxiety. Patient states that council on aging has been made aware of all concerns. Patient has been asked for money from her aid to help with her electric bill and was asked to provide her banking account. And patient does not have a portable commode resulting in her staying in bed not drinking after 6 pm. Patient states this is causing her anxiety to stay in fight or flight mode. Patient is asking for a return call.

## 2016-08-25 NOTE — Unmapped (Signed)
Patient called  Phone [phone] (home)     Pharmacy phone / location     [facility] Spaniel that she has post surgical appointment on August 16th.  Would like to be seen by Allergy  then if possible.  Please advise   Future Appointments  Date Time Provider Department Center   08/26/2016 2:45 PM Jonelle Sidle, MD [facility] PSY OP OP   09/02/2016 2:30 PM ORTHO PA, [facility] ORTH MAB MAB   09/16/2016 9:40 AM Ilona Dubuske V, DO [facility] ALG HOX HOX   10/08/2016 10:30 AM Court Joy, MD [facility] FAC HOX HOX

## 2016-08-25 NOTE — Unmapped (Signed)
There are no available appointments for 09/02/16 in allergy clinic     Will add pt to wait list     Notified pt via MyChart     Thank you

## 2016-08-26 ENCOUNTER — Ambulatory Visit: Admit: 2016-08-26 | Discharge: 2016-08-26 | Payer: MEDICARE | Attending: Psychiatry

## 2016-08-26 DIAGNOSIS — F411 Generalized anxiety disorder: Secondary | ICD-10-CM

## 2016-08-26 MED ORDER — busPIRone (BUSPAR) 10 MG tablet
10 | ORAL_TABLET | Freq: Three times a day (TID) | ORAL | 3 refills | Status: AC
Start: 2016-08-26 — End: 2016-11-25

## 2016-08-26 MED ORDER — vilazodone 10 mg (7)- 20 mg (23) DsPk
10 | ORAL_TABLET | Freq: Every day | ORAL | 2 refills | Status: AC
Start: 2016-08-26 — End: 2016-09-23

## 2016-08-26 MED ORDER — clonazePAM (KLONOPIN) 1 MG tablet
1 | ORAL_TABLET | Freq: Two times a day (BID) | ORAL | 0 refills | Status: AC | PRN
Start: 2016-08-26 — End: 2016-09-02
  Filled 2016-08-26: qty 14, 7d supply, fill #0

## 2016-08-26 NOTE — Unmapped (Signed)
Visit Start Time: 2:45 PM  Visit Quit Time: 3:30 PM      Chief Complaint   Patient presents with   ??? Anxiety     Context: recent surgery, allergic reactions, feeling overwhelmed with the things that need to be done at the house  Location: Altered mental status of mood and anxiety  Duration: acute on chronic  Severity: moderate.  Associated Symptoms: hopelessness, fatigue, tearfulness, irritability  Modifying Factors: optimization of medications, not currently in therapy        History of Present Illness:   Emily Bonilla is a 56 y.o. female with PMH anxiety, atrial fibrillation, WPW, idiopathic mast cell activation, anemia, asthma, hypothyroidism, GERD, intermittent chronic HA, [facility]. Of note, most recent allergy note 06/17/16 stated that the patient does not meet criteria for mastocytosis (no lab findings consistent with the disorder). She was also recently admitted to the orthopedic service 08/17/16-08/19/16 for removal of hardware in her left leg.    Per chart review, the patient last saw previous provider Dr. Vance Gather 07/22/16. At that time she was very focused on her health and anxiety. Up-titrated Buspirone to 40 mg total daily given beneficial effects.    HPI     On interview, the patient is anxious and mildly irritable. She is perseverative about her medical symptoms and demanding about adequate treatment for her anxiety. It is difficult to appease her and she is loud at times but not verbally aggressive. She talks about her anxiety recently worsening because of her surgery. Anxiety has been up and down for the past few years. She says her anxiety was ramped up before the surgery and then worsened because of her reactions to the anesthetics. She says that the anxiety goes hand-in-hand with her mast cell activation allergic reactions and anaphylaxis. She talks about many different triggers for her allergic reactions, including pollen and certain perfumes. She feels  overwhelmed with the things to do around the house, including leaks in the roof and financial stress and is thinking about going into an assisted living facility. She talks about how her home health has ben inconsistent. For the past few weeks she has had daily episodes of tearfulness, not enjoying anything because she doesn't have the energy to do so because she is spending her energy doing things around the house. +hopelessness. Appetite fine. Sleeping well. Denies SI/HI, intent or plan. CFS. Denies excessively elevated or irritable mood with increased energy, decreased need for sleep, grandiosity, increase in goal-directed or risky behaviors. Denies AH/VH, paranoia. She says that Buspirone has been helpful to take the edge off but says that it is not enough and if something isn't done then she will go to the Emergency Room and admit myself to the hospital because she can't tolerate this anxiety. Denies any medication SEs. Is taking multiple anti-histamine medications. Says that she was previously on Clonazepam (for 10 months on/10 months off), prescribed by physician at [facility], says this was coordinated with her hematologist when her IgG levels were low. Very focused on the treatment for her medical symptoms and talks about how she was previously on IV benadryl for several years. Is interested in re-starting hypno-therapy. Tell patient that I am not familiar with this or where to find a hypno-therapist, encourage her to look this up on the Internet. Patient is interested in starting psychotherapy however. Denies any substance use. Lives by herself. Previously worked as a Engineer, civil (consulting).  Patient History:     Psychiatric History:     Prior Inpatient Psychiatric Hospitalization(s): denies  Prior Psychotropic Medications: reports taking prior psychotropic medications.  Current Psychotropic Medications: yes - see below current psychotropic medications.  SA: denies    Past Medical History:   Diagnosis  Date   ??? Anaphylaxis    ??? Anemia    ??? Asthma    ??? Atrial fibrillation (CMS Dx)    ??? Bronchitis    ??? Heart disease    ??? Neurological disease    ??? OSA (obstructive sleep apnea)     states resolved   ??? Osteoporosis    ??? Spontaneous pneumothorax     states pneumomediatsium/pneumopericardium, self resolved   ??? Thyroid disease    ??? Ulcer (CMS Dx)    ??? WPW (Wolff-Parkinson-White syndrome)        Past Surgical History:   Procedure Laterality Date   ??? BONE MARROW BIOPSY      benign   ??? CARDIAC ELECTROPHYSIOLOGY MAPPING AND ABLATION      attempted x 2, states not successful either time   ??? CHOLECYSTECTOMY     ??? FACIAL COSMETIC SURGERY     ??? HYSTERECTOMY     ??? LIVER BIOPSY      benign hepatic adednoma per patient   ??? REMOVAL HARDWARE LEG Left 08/17/2016    Procedure: REMOVAL OF HARDWARE LEFT TIBIA;  Surgeon: Andee Lineman, MD;  Location: [facility] OR;  Service: Orthopedics;  Laterality: Left;   ??? TIBIA FRACTURE SURGERY     ??? TONSILLECTOMY      + adenoids       Social History     Social History   ??? Marital status: Divorced     Spouse name: N/A   ??? Number of children: N/A   ??? Years of education: N/A     Social History Main Topics   ??? Smoking status: Never Smoker   ??? Smokeless tobacco: Never Used   ??? Alcohol use No   ??? Drug use: No   ??? Sexual activity: Not Asked     Other Topics Concern   ??? None     Social History Narrative   ??? None       Family History   Problem Relation Age of Onset   ??? Kidney failure Mother    ??? Aneurysm Mother        Review of Systems  +chronic intermittent diarrhea  +weight gain 10 lbs over the past 1 month  Denies fevers, CP, SOB, abdominal pain, dysuria    Allergies:   Acetaminophen; Aspirin; Blue dye; Epinephrine; Flecainide; Gabapentin; Iodinated contrast- oral and iv dye; Isopropyl alcohol; Latex; Lidocaine; Nsaids (non-steroidal anti-inflammatory drug); Onion; Other; Perfume; Pregabalin; Quetiapine; Red dye; Sulfur; Yellow dye; Doxepin; Duloxetine; Epi e-z pen; Hydroxyzine; Levofloxacin; Montelukast;  Verapamil (bulk); Adhesive tape-silicones; and Trazodone    Medications:     Outpatient Encounter Prescriptions as of 08/26/2016   Medication Sig Dispense Refill   ??? cromolyn (GASTROCROM) 100 mg/5 mL solution Take 10 ml by mouth 3 times daily 960 mL 0   ??? diphenhydrAMINE (BENADRYL) 50 MG tablet Take 50 mg by mouth 3 times a day.     ??? fexofenadine (ALLEGRA) 180 MG tablet Take 1 tablet (180 mg total) by mouth daily. 30 tablet 3   ??? furosemide (LASIX) 20 MG tablet Take 1 tablet (20 mg total) by mouth daily. 180 tablet 1   ??? levalbuterol (XOPENEX) 1.25 mg/3 mL nebulizer solution Inhale  3 mL (1.25 mg total) by nebulization every 6 hours as needed for Wheezing. 72 mL 0   ??? levothyroxine (SYNTHROID, LEVOTHROID) 112 MCG tablet Take one tablet by mouth once daily 90 tablet 3   ??? montelukast (SINGULAIR) 10 mg tablet Take 1 tablet (10 mg total) by mouth at bedtime. 90 tablet 1   ??? nebulizer and compressor Devi Use 1 ampule as directed every 4 hours. Indications: Please dispense 1 nebulizer unit 1 each 0   ??? omeprazole (PRILOSEC) 40 MG capsule Take 40 mg by mouth.     ??? onabotulinumtoxinA (BOTOX COSMETIC) 50 unit SolR Inject into the muscle.     ??? ranitidine (ZANTAC) 300 MG tablet Take 1 tablet (300 mg total) by mouth 3 times a day. 90 tablet 11   ??? triamcinolone (KENALOG) 0.1 % ointment Apply topically 2 times a day. 30 g 3   ??? [DISCONTINUED] busPIRone (BUSPAR) 10 MG tablet Take 0.5 tablets (5 mg total) by mouth See Admin Instructions. Indications: Generalized Anxiety Disorder 113 tablet 0   ??? busPIRone (BUSPAR) 10 MG tablet Take 2 tablets (20 mg total) by mouth 3 times a day. Indications: Generalized Anxiety Disorder 180 tablet 3   ??? clonazePAM (KLONOPIN) 1 MG tablet Take 1 tablet (1 mg total) by mouth 2 times a day as needed for Anxiety for up to 7 days. 14 tablet 0   ??? vilazodone 10 mg (7)- 20 mg (23) DsPk Take 10 mg by mouth daily for 7 days, then take 20 mg daily thereafter. 30 tablet 2     Facility-Administered Encounter  Medications as of 08/26/2016   Medication Dose Route Frequency Provider Last Rate Last Dose   ??? ciprofloxacin lactate (CIPRO) 400 mg in dextrose 5% in water (D5W) 200 mL IVPB  400 mg Intravenous 2 times per day Andee Lineman, MD            Outpatient Medications Prior to Visit   Medication Sig Dispense Refill   ??? cromolyn (GASTROCROM) 100 mg/5 mL solution Take 10 ml by mouth 3 times daily 960 mL 0   ??? diphenhydrAMINE (BENADRYL) 50 MG tablet Take 50 mg by mouth 3 times a day.     ??? fexofenadine (ALLEGRA) 180 MG tablet Take 1 tablet (180 mg total) by mouth daily. 30 tablet 3   ??? furosemide (LASIX) 20 MG tablet Take 1 tablet (20 mg total) by mouth daily. 180 tablet 1   ??? levalbuterol (XOPENEX) 1.25 mg/3 mL nebulizer solution Inhale 3 mL (1.25 mg total) by nebulization every 6 hours as needed for Wheezing. 72 mL 0   ??? levothyroxine (SYNTHROID, LEVOTHROID) 112 MCG tablet Take one tablet by mouth once daily 90 tablet 3   ??? montelukast (SINGULAIR) 10 mg tablet Take 1 tablet (10 mg total) by mouth at bedtime. 90 tablet 1   ??? nebulizer and compressor Devi Use 1 ampule as directed every 4 hours. Indications: Please dispense 1 nebulizer unit 1 each 0   ??? omeprazole (PRILOSEC) 40 MG capsule Take 40 mg by mouth.     ??? onabotulinumtoxinA (BOTOX COSMETIC) 50 unit SolR Inject into the muscle.     ??? ranitidine (ZANTAC) 300 MG tablet Take 1 tablet (300 mg total) by mouth 3 times a day. 90 tablet 11   ??? triamcinolone (KENALOG) 0.1 % ointment Apply topically 2 times a day. 30 g 3   ??? busPIRone (BUSPAR) 10 MG tablet Take 0.5 tablets (5 mg total) by mouth See Admin Instructions. Indications: Generalized  Anxiety Disorder 113 tablet 0     Facility-Administered Medications Prior to Visit   Medication Dose Route Frequency Provider Last Rate Last Dose   ??? ciprofloxacin lactate (CIPRO) 400 mg in dextrose 5% in water (D5W) 200 mL IVPB  400 mg Intravenous 2 times per day Andee Lineman, MD           Objective:   Physical Exam    Gait and  Stations: normal    Mental Status Exam:   Appearance: middle-aged Caucasian female, appears stated age, good hygiene and grooming, fair eye contact, cooperative however mildly irritable  Activity: no PMR or PMA, no abnormal movements noted  Orientation: alert, oriented to person, place, purpose  Speech: hyper-verbal and mildly pressured but easily able to be interrupted, at times loud, normal latency of response, fluent with no aphasia  Mood/Affect: anxious. Affect congruent with stated mood, restricted range, anxious, mildly irritable  Thought Process: organized, linear, goal-directed, no LOA or FOI  Thought Content: denies SI/HI, talks about significant anxiety related to allergic reactions, very focused on her health symptoms  Perceptions: denies AH/VH, does not appear to be internally preoccupied or RTIS  Attention:fair to conversation  Fund of Knowledge:average  Memory:appropriate to conversation  Insight/Judgement: fair/fair    PHQ-9 Scores:   PHQ Total Score 03/30/2016 05/21/2016 06/24/2016 08/26/2016   PHQ-9 Total Score 0 0 0 9       GAD7 Scores:   GAD7Total Score 08/26/2016   Total Score 10          Assessment/Plan:   Devan Babino is a 56 y.o. female with PMH anxiety, atrial fibrillation, WPW, idiopathic mast cell activation, anemia, asthma, hypothyroidism, GERD, intermittent chronic HA, [facility].    The patient is anxious and very preoccupied with her reported allergic reactions and reported mast cell activation syndrome. However in reviewing the most recent UC allergy note, she does not meet criteria for mastocytosis. Will reach out to her providers, as this clarification about her medical diagnosis is very important in fully understanding her psychiatric presentation and symptoms. 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 Will optimize patient's pharmacologic management of her anxiety with the below plan and also start  therapy with Dr. Nedra Hai.    DSM-5 Diagnoses: Unspecified anxiety disorder. R/O GAD. R/O Adjustment disorder with mixed anxiety and depressed mood. Cluster B traits (particularly histrionic). R/O Somatic symptom disorder. R/O Illness anxiety disorder. R/O Factitious disorder  Medical Diagnoses: Atrial fibrillation, WPW, idiopathic mast cell activation, anemia, asthma, hypothyroidism, GERD, intermittent chronic HA  GAF: 60    # Unspecified anxiety disorder:  -Increase Buspirone to 20 mg TID  -Will prescribe short course of Clonazepam 1 mg BID for 7 days (14 total pills) for acute period of significant anxiety  -Start Vilazodone for anxiety and mood. 10 mg daily for 1 week, followed by 20 mg daily  -Follow-up with Dr. Nedra Hai for psychotherapy  -Will reach out to Dr. Elisabeth Most and Dr. Joni Reining at the [facility] for clarification about the patient's medical diagnosis  -RTC in 4 weeks    Discussed risks/benefits/side effects/alternatives to medications. In particular discussed risks with taking both anti-histamine medications and a benzodiazepine, including risk for sedation, respiratory depression, confusion and hypotension. Patient expressed understanding of and agreement to the current treatment plan.     # Safety: The patient's imminent risk appears low, as she denies SI/HI, intent or plan, no previous SA, CFS, future-oriented and goal-oriented to  continue treatment. Patient expressed understanding of resources, including 911 and PES.    # Substance:  -No active issues    # Medical:  -Follow-up with PCP    Jonelle Sidle, MD  Psychiatry PGY-3  Pager: [phone]    Patient seen, plan discussed and agreed upon by attending Dr. Thomasena Edis

## 2016-08-26 NOTE — Unmapped (Signed)
Please take your medication as prescribed and follow-up in 4-6 weeks.    If feeling suicidal, call the suicide hotline at 513-281-CARE (2273), 911, or return to the emergency room.     SUICIDE PREVENTION RESOURCES:   [facility]:  513-281-CARE [phone]) hotline      [facility]: [phone]      Mobile Crisis Team [phone]   [facility]:  [facility] Consultation & Crisis: [phone]   [facility]: Emergency Crisis Hotline: 513-528-SAVE 587-078-2447)   [place]: [facility] Emergency Crisis Line: (224) 741-8360   National:  National Hotline: 1-800-273-TALK (8255)      Www.suicidepreventionlifeline.orgg  ------------------------------------------------------------------------------------------------------------------  Discharge Instructions After an Episode of Suicide Threats or Actions   Remove all fireams, weapons (of any kind) or any unneeded medicines that could be used   Take person to follow-up mental health appointments   Have a loved one sign a release of information allowing you to contact the mental health provider   Be direct and talk openly about suicidal thoughts   Be willing to listen. Allow expressions of feelings   Block all inappropriate internet websites and social media   Offer to be available to them at any time   Be available to accompany them to the nearest emergency department   Call other family members or friends to help and offer support       Get help from agencies that specialize in crisis intervention   Using the information here and other community resources, create a suicide safety plan. Example: call support networks, crisis lines, 911, go to emergency room, call Mobile Crisis Team, call mental health provider    Warning Signs:   Severe depression with restlessness   Inability to sleep or sleeping too much   Withdrawing from friends, family and life   Increase alcohol and/or drug use   Giving away personal items   Anxiety, agitation, rage, or  anger   Unpredictable mood swings   Feelings like there is no way out   Hopelessness   Constantly talking or writing about death   Seeking out information about suicide    What Family Members & Friends Should Know:   Suicidal ideas are usually associated with treatable conditions   People who try or actually commit suicide try to let someone know by leaving a note   All suicide threats should be taken seriously   Suicidal ideas occur when people are depressed, intoxicated or irrational   Suicidal thinking can consume a person   Suicide risk increases when a person who has been severely depressed suddenly has more energy   Most common ways of suicide - pills, guns, poisons, hanging, carbon monoxide breathing, jumping off high places, and accidents    How Can I Help When Someone is Threatening Suicide?   Take Action!    1. Take his/her words seriously and respond with compassion    2. Do not leave them alone    3. Call 911 and have person taken to emergency room    4. Accompany the person to the emergency room and provide the physician with information

## 2016-09-01 ENCOUNTER — Encounter: Payer: MEDICARE | Attending: Clinical

## 2016-09-02 ENCOUNTER — Ambulatory Visit: Admit: 2016-09-02 | Discharge: 2016-09-02 | Payer: MEDICARE

## 2016-09-02 DIAGNOSIS — T8484XA Pain due to internal orthopedic prosthetic devices, implants and grafts, initial encounter: Secondary | ICD-10-CM

## 2016-09-02 NOTE — Unmapped (Signed)
This office note has been dictated.

## 2016-09-02 NOTE — Unmapped (Signed)
Sutures were removed per the doctor's instruction.   Patient tolerated this well with no complications.   Steri strips were applied.   Patient was given instructions regarding showering/bathing.

## 2016-09-02 NOTE — Unmapped (Signed)
Chief Complaint:   Chief Complaint   Patient presents with   ??? Post-op Evaluation       Subjective:   Emily Bonilla 56 y.o.female returns 2 wks s/p ROH L distal tibia.  h/o mast cell dz.   No major adverse rxn to surgery.  Reports some localized swelling.  No current redness.    Objective:  Vitals:    09/02/16 1435   Weight: 165 lb (74.8 kg)   Height: 5' 7 (1.702 m)       Physical Exam:  Well-appearing.  L ankle medial incision healing well.  Focal swelling at distal end of incision.  Euthermic .  No erythema.    Assessment:  s/p ROH L distal tibia    Plan:  Sutures removed, steris applied.  Do not submerge wounds for another 2-3 wks.  Activity as tol  RTC prn       Edison Simon, PA-C

## 2016-09-06 NOTE — Unmapped (Signed)
Patient calling to say that she took Viibryd for 10 days and then stopped it over the weekend because of adverse reaction (crying, extreme sadness, pacing, itching, irregular heartbeat). She is still taking the Buspar and she is feeling much better.

## 2016-09-06 NOTE — Progress Notes (Signed)
Faxed  to number given. Confirmation received, copy filed. LISA STARKEY8/21/201812:39 PM

## 2016-09-06 NOTE — Progress Notes (Signed)
[facility] regarding SN Discharge Summary, dated 09/01/16. Received and will be placed in MD's box.Houleye Sall 09/06/2016 1:30 PM

## 2016-09-08 NOTE — Unmapped (Signed)
Patient called  Phone [phone] (home)     Pharmacy phone / location [facility] in the computer because they deliver because she is homebound     Held for an hour on Monday and left a message and not hearing anything from office   Allergies and pharmacy verified. --MANY  MANY  That is my disease     Call 9-1-1 if there is:  Chest tightness, SOB, or lip and tongue swelling ?  No happened last Friday   Seek emergency care now if:  Bite is from [employer] or Black widow Spider?  No clue   If bite results in decreased consciousness ?  no  Seek medical care in 2 -4 hours if after bug bite:  There is a sudden onset of hives, rash or itching ? Rash raised red and hot to the whole area and there was lump the size of a plum with a pustule by the end of the morning  - area of bite is upper thigh   There is swelling at a different site than the bite. ? Yes   There is muscle stiffness, abdominal pain, or restlessness. ? No I did get diarrhea   There is drainage, red streaks, or pus ? Next morning there was a green bloody drainage and she had got the core out at that point and she did have mental changes with it that may have been new medicine or from bite and had increased anxiety and sadness  that she has not ever had in her life which resolved.   Seek medical care within 24 hours if after bite:  Patient has headache, fever,. Or chills ? 101.4  Patient is not able to remove stinger or tick ? yes  There is increased swelling or redness or streaking at the site.? Now that it is a week later it looks like a dinner size redness and large purple lump the size of a grape and I messed the rest of what she said - she was using warm compresses   There was a bullseye around the area   Or if patient is diabetic and bite on  lower extremity. ? Mast cell activation syndrome   Advice/Appointment given : been a week and patient is homebound and not able to get into office at this time but would like a medrol dose pack and an antibiotic  Can use Cipro, Biaxin, Ceftin,  or Bactrim DS     Use OTC antihistamine such as Benadryl Takes massive amounts daily - be advised that this may cause drowsiness so activities requiring mental alertness should be avoided.  Forwarding to MD for review and further instructions or advice.

## 2016-09-13 ENCOUNTER — Ambulatory Visit: Admit: 2016-09-13 | Payer: MEDICARE | Attending: Clinical

## 2016-09-13 DIAGNOSIS — F4321 Adjustment disorder with depressed mood: Secondary | ICD-10-CM

## 2016-09-13 MED ORDER — montelukast (SINGULAIR) 10 mg tablet
10 | ORAL_TABLET | Freq: Every evening | ORAL | 1 refills | Status: AC
Start: 2016-09-13 — End: 2016-09-16

## 2016-09-13 MED ORDER — cromolyn (GASTROCROM) 100 mg/5 mL solution
100 | Freq: Three times a day (TID) | ORAL | 0 refills | 24.00000 days | Status: AC
Start: 2016-09-13 — End: 2016-09-16

## 2016-09-13 MED ORDER — furosemide (LASIX) 20 MG tablet
20 | ORAL_TABLET | Freq: Every day | ORAL | 1 refills | Status: AC
Start: 2016-09-13 — End: 2017-04-09

## 2016-09-13 NOTE — Unmapped (Signed)
Initial Visit    Visit Start Time:  9:00am   Visit Quit Time:  9:56am  Note: Writer obtained verbal informed consent prior to initiating today's evaluation. Patient had previously provided written informed consent for outpatient services at [facility].    Chief complaint:anxiety, chronic health issues  History of Present Illness:   Emily Bonilla is a 56 y.o. 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 Patient reported that she experiences allergic reactions up to 15 times per day and that when this happens, her fight or flight system activates and she feels very anxious. Patient reported that she has had mast cell activation throughout her life but that her anxiety has worsened more recently because she perceives herself to have less opportunity to work out her anxiety. Patient did not articulate many symptoms of anxiety apart from describing the fight or flight response. She provided extensive information on her medical history and the efforts she has made to reduce symptoms associated with mast cell activation syndrome. Patient reported that she has been in [place] for 8 months and that she came here specifically because she believes doctors here have had experience with her disorder. Patient reported she has received and/or sought treatment at [facility], [facility], and [facility]. She reported that in the past she had an IV and she would push benedryl every four hours. Patient stated that she uses meditation to cope with her anxiety when it emerges. She reported that she has also found hypnotherapy to be helpful in the past.     Patient reported she currently lives alone but that she has a home health aid that come to her house four hours every day. Patient stated she is hoping to get placed in an assisted living facility, which will help her to be around people more often. Patient described herself as an active and outgoing person.  She reported her ability to engage in things she enjoys has been significantly limited in the last three years, and that she is having difficulty coping with this. Patient was tearful as she described the stress she experiences as a result of her medical condition. Patient reported she was divorced about five years ago after being married for over 20 years. She reported her primary support is a couple of friends with whom she talks on the phone or texts with. Patient denied the use of alcohol or illicit substances.         Patient History:   Psychiatric History:     Prior Inpatient Psychiatric Hospitalization(s): has never been hospitalized  Prior Out-patient Treatment: had several psychiatric outpatient treatments  Prior Psychotropic Medications: reports taking prior psychotropic medications.  Current Psychotropic Medications: see medication list for current psychotropic medications.    Past Medical History:   Diagnosis Date   ??? Anaphylaxis    ??? Anemia    ??? Asthma    ??? Atrial fibrillation (CMS Dx)    ??? Bronchitis    ??? Heart disease    ??? Neurological disease    ??? OSA (obstructive sleep apnea)     states resolved   ??? Osteoporosis    ??? Spontaneous pneumothorax     states pneumomediatsium/pneumopericardium, self resolved   ??? Thyroid disease    ??? Ulcer (CMS Dx)    ??? WPW (Wolff-Parkinson-White syndrome)        Past Surgical History:   Procedure Laterality Date   ??? BONE MARROW BIOPSY  benign   ??? CARDIAC ELECTROPHYSIOLOGY MAPPING AND ABLATION      attempted x 2, states not successful either time   ??? CHOLECYSTECTOMY     ??? FACIAL COSMETIC SURGERY     ??? HYSTERECTOMY     ??? LIVER BIOPSY      benign hepatic adednoma per patient   ??? REMOVAL HARDWARE LEG Left 08/17/2016    Procedure: REMOVAL OF HARDWARE LEFT TIBIA;  Surgeon: Andee Lineman, MD;  Location: [facility] OR;  Service: Orthopedics;  Laterality: Left;   ??? TIBIA FRACTURE SURGERY     ??? TONSILLECTOMY      + adenoids       Social History     Social History   ??? Marital status:  Divorced     Spouse name: N/A   ??? Number of children: N/A   ??? Years of education: N/A     Social History Main Topics   ??? Smoking status: Never Smoker   ??? Smokeless tobacco: Never Used   ??? Alcohol use No   ??? Drug use: No   ??? Sexual activity: Not on file     Other Topics Concern   ??? Not on file     Social History Narrative   ??? No narrative on file       Family History   Problem Relation Age of Onset   ??? Kidney failure Mother    ??? Aneurysm Mother          Mental Status Exam:   Motor Behavior:?? no psychomotor abnormalities  Cognition:?? short term and long term memory intact  Attitude:?? cooperative  Affect:?? tearful and labile  Appearance:??appropriately dressed  Speech:?? normal rate  Mood:?? ??dysphoric  Thought Processes:??goal directed  Perceptions:no hallucinations or other abnormalities?? ??  Thought content:  ??somatic concerns  Insight/ judgement: fair  Suicidal ideation: none  Homicidal ideation: none  Patient was asked directly and denied SI/HI, plan or intent. She is future-oriented and treatment-seeking; voicing her intention to follow up with her providers and to continue to seek adequate medical care. Patient is not at imminent risk for harm to self or others and remains sustainable as an outpatient.   Assessment:   Emily Bonilla is a 56 y.o. female who presents with anxiety and sadness in response to difficulties managing her chronic health conditions. Patient described anxiety in response to experiencing allergic reactions; describing a fight or flight response. Patient reported she is interested in hypnotherapy and meditation to address her anxiety. Writer told patient that she is unaware of any psychologists at [facility] her practice hypnotherapy; however, Clinical research associate stated she is willing to look into possible referrals for this therapeutic approach. Writer also provided psychoeducation on the [facility] and discussed with patient how the psychologist within this clinic may be a  better match for her needs and could link her with additional resources. Patient was agreeable to this referral.       Dx:  Unspecified anxiety disorder  Adjustment disorder with depressed mood  Plan:   Provider to place referral for integrative wellness psychologist and to obtain information on therapists who provide hypnotherapy, at patient's request. Writer will contact patient by phone to provide this information.    GOALS/ STRATEGIES OF TREATMENT PLAN   Goals not established and patient and provided collaboratively determined that referrals to specialist were appropriate

## 2016-09-16 ENCOUNTER — Ambulatory Visit: Admit: 2016-09-16 | Discharge: 2017-08-08 | Payer: MEDICARE

## 2016-09-16 DIAGNOSIS — D839 Common variable immunodeficiency, unspecified: Secondary | ICD-10-CM

## 2016-09-16 MED ORDER — cromolyn (GASTROCROM) 100 mg/5 mL solution
100 | Freq: Three times a day (TID) | ORAL | 0 refills | 24.00000 days | Status: AC
Start: 2016-09-16 — End: 2016-10-20

## 2016-09-16 MED ORDER — levalbuterol (XOPENEX) 1.25 mg/3 mL nebulizer solution
1.25 | Freq: Four times a day (QID) | RESPIRATORY_TRACT | 0 refills | Status: AC | PRN
Start: 2016-09-16 — End: 2018-10-19

## 2016-09-16 MED ORDER — nebulizer and compressor Devi
0 refills | Status: AC
Start: 2016-09-16 — End: 2018-10-19

## 2016-09-16 MED ORDER — predniSONE (DELTASONE) 20 MG tablet
20 | ORAL_TABLET | Freq: Every day | ORAL | 0 refills | Status: AC
Start: 2016-09-16 — End: 2016-11-08

## 2016-09-16 MED ORDER — fexofenadine (ALLEGRA) 180 MG tablet
180 | ORAL_TABLET | Freq: Every day | ORAL | 3 refills | Status: AC
Start: 2016-09-16 — End: 2018-10-19

## 2016-09-16 MED ORDER — triamcinolone (KENALOG) 0.1 % ointment
0.1 | Freq: Two times a day (BID) | TOPICAL | 3 refills | 1.00000 days | Status: AC
Start: 2016-09-16 — End: 2017-01-04

## 2016-09-16 MED ORDER — montelukast (SINGULAIR) 10 mg tablet
10 | ORAL_TABLET | Freq: Every evening | ORAL | 1 refills | Status: AC
Start: 2016-09-16 — End: 2017-03-08

## 2016-09-16 MED ORDER — doxepinSINEQUAN10MGcapsule
10 | ORAL_CAPSULE | Freq: Every evening | ORAL | 3 refills | Status: AC
Start: 2016-09-16 — End: 2017-09-06

## 2016-09-16 MED ORDER — ranitidine (ZANTAC) 300 MG tablet
300 | ORAL_TABLET | Freq: Three times a day (TID) | ORAL | 11 refills | Status: AC
Start: 2016-09-16 — End: 2017-09-08

## 2016-09-16 MED ORDER — EPINEPHrine (EPIPEN) 0.3 mg/0.3 mL AtIn injection
0.3 | PACK | Freq: Once | INTRAMUSCULAR | 3 refills | 30.00000 days | Status: AC
Start: 2016-09-16 — End: 2016-09-16

## 2016-09-16 NOTE — Unmapped (Signed)
Patient called  Phone [phone] (home)       York Spaniel she was just seen and   York Spaniel she got overwhelmed at visit but she had IGG that was normal in April or May and said she saw electrophysiologist 2 times

## 2016-09-16 NOTE — Unmapped (Signed)
ALLERGY & IMMUNOLOGY Clinic Note     CC: Follow up for mast cell disorder. Last seen 06/17/2016    HPI:  Emily Bonilla is a 56 y.o. female with a complicated PMHx significant for asthma and idiopathic mast cell activation who presents to the allergy clinic today for a f/u visit, accompanied by her aide.     Mast cell disorder  Her symptoms include hives, flushing, itching, diarrhea, and hypotension during episodes, ever since she was a teenager. She also has dermatographia. Urticaria is improved since last visit, but not completely gone. About 2 episodes/week. She would like to have something added to her regimen to continue to improve her quality of life. July 31st had titanium removed in her L leg due to developing contact dermatitis. She claims to be allergic to all metals. She reports at the last visit, she was told to cut down on the Benadryl because of her dizziness, but she has not been able to due to severity of her episodes. She is back to taking 250mg  a day.     Asthma  SOB is mild, usually with a reaction. Reports no wheezing, & that her asthma has been well-controlled for the past 2 years. Peak Flow today: 360 L/min.    Focal seizures  Dizziness not better since last visit, syncope yes. Being exposed to cinnamon increased rate of focal seizures (3 in last 2.5 wks). No neurologist at this time, nobody following. In process of getting one. Gets Depakote in ER each time. Has been having seizures since 16. Loses consciousness each time.     Cardiac arrythmia  Reports feeling skipped beats sometimes during reactions, reports diagnoses of WPW and Afib. No cardiologist at this time, in process of getting one.    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 Used to get IVIG, but now that she has moved to [place] (8 mo ago) she needs a new immunologist. In process of getting one.    She is requesting prescriptions for Xopenex (Levosalbutamol) 1.25mg  PRN and nebulizer. Wants to get back on  Doxepin. Would like a scrip for prednisone as well as an Epipen to have on hand for flares. Would be open to trying Xolair today.      Review of Systems:  GENERAL: weight gain (50 lbs since October)  EYES: denies changes in vision, watery or itchy eyes   ENT: denies changes in hearing or dental problems   CARDIOVASCULAR: per HPI  LUNGS: +mild SOB   ABDOMEN: +diarrhea during episodes  GU: denies dysuria or hematuria  HEME/ONC: denies abnormal bleeding or bruising  SKIN: per HPI  MSK: denies muscle weakness, joint stiffness or joint pain   ENDO: denies heat or cold intolerance  NEURO: denies seizures, headaches or focal neurological deficits  PSYCH: +anxiety      Past Medical History:  Past Medical History:   Diagnosis Date   ??? Anaphylaxis    ??? Anemia    ??? Asthma    ??? Atrial fibrillation (CMS Dx)    ??? Bronchitis    ??? Heart disease    ??? Neurological disease    ??? OSA (obstructive sleep apnea)     states resolved   ??? Osteoporosis    ??? Spontaneous pneumothorax     states pneumomediatsium/pneumopericardium, self resolved   ??? Thyroid disease    ??? Ulcer (CMS Dx)    ??? WPW (Wolff-Parkinson-White syndrome)        Past Surgical History:  Past Surgical History:  Procedure Laterality Date   ??? BONE MARROW BIOPSY      benign   ??? CARDIAC ELECTROPHYSIOLOGY MAPPING AND ABLATION      attempted x 2, states not successful either time   ??? CHOLECYSTECTOMY     ??? FACIAL COSMETIC SURGERY     ??? HYSTERECTOMY     ??? LIVER BIOPSY      benign hepatic adednoma per patient   ??? REMOVAL HARDWARE LEG Left 08/17/2016    Procedure: REMOVAL OF HARDWARE LEFT TIBIA;  Surgeon: Andee Lineman, MD;  Location: [facility] OR;  Service: Orthopedics;  Laterality: Left;   ??? TIBIA FRACTURE SURGERY     ??? TONSILLECTOMY      + adenoids       Social History:  Tobacco use: none  Alcohol use: none    Allergies:  Allergies   Allergen Reactions   ??? Acetaminophen Anaphylaxis     Unknown   ??? Aspirin Anaphylaxis     Unknown   ??? Blue Dye Anaphylaxis     Unknown   ??? Epinephrine  Anaphylaxis   ??? Flecainide Shortness Of Breath     Itching, decreased libido, wheezing, flushing, numbness.    ??? Gabapentin Anaphylaxis   ??? Iodinated Contrast- Oral And Iv Dye Anaphylaxis   ??? Isopropyl Alcohol Anaphylaxis   ??? Latex Anaphylaxis and Rash   ??? Lidocaine Anaphylaxis   ??? Nsaids (Non-Steroidal Anti-Inflammatory Drug) Anaphylaxis   ??? Onion Anaphylaxis   ??? 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   ??? Perfume Anaphylaxis   ??? Pregabalin Anaphylaxis   ??? Quetiapine Anaphylaxis     Mental Status Change   ??? Red Dye Anaphylaxis     Unknown   ??? Sulfur Anaphylaxis     Pt states: Any alcohol product   ??? Yellow Dye Anaphylaxis     Unknown   ??? Doxepin Other (See Comments)     Unknown   ??? Duloxetine      Other reaction(s): Other (See Comments)  Mental Status Change   ??? Epi E-Z Pen    ??? Hydroxyzine Itching     Unknown   ??? Levofloxacin Itching   ??? Montelukast    ??? Verapamil (Bulk) Hives     Insomnia, flushing, scattered brain, migraine, itching, anxiety.    ??? Adhesive Tape-Silicones Itching, Rash and Swelling     Unknown   ??? Trazodone Rash       Current Medications:  Buspirone 20mg  3x day  Cetirizine 10mg  3 times daily  Fexofenadine 180mg  daily  Montelukast 10mg  QHS  Benadryl 250mg  a day  Cromolyn 200 mg 3x a day  Synthroid 112 ug 1x day  Lasix 20 mg 1x day    Physical Exam:   Resp. rate 18, height 5' 6 (1.676 m), weight 189 lb (85.7 kg), peak flow 360 L/min.  GENERAL APPEARANCE: Well developed, well nourished, in no acute distress, alert and oriented.  SKIN: Inspection of the skin reveals no rashes, ulcerations or petechia. Healing scar on L lower leg from titanium removal.  HEENT: Normal turbinates, no evidence of swelling or  rhinorrhea. No masses, polyps or pus. No septal perforation.  NECK: Supple and symmetric.  LUNGS: Auscultation of the lungs revealed normal breath sounds, no  wheezes or crackles.  CARDIOVASCULAR: Regular rate and rhythm without any murmurs, rubs or gallops.  ABDOMEN: Soft and nontender with normal bowel sounds.  LYMPH NODES:  No lymphadenopathy.  EXTREMITIES:  No cyanosis, clubbing or edema.  NEUROLOGIC: No focal neurological deficits noted.    Assessment and Plan:    1. Idiopathic mast cell activation  - We have requested the pt bring her medical records from [place] [facility] [place]. Apparently there has been a miscommunication where the records have not been provided to the allergy clinic. The patient was instructed to directly request the records and bring them to her next appointment.  - start doxepin 10mg  1x day  - prescriptions for prednisone and Epipen sent in to use PRN    2. Asthma  - prescriptions for Xopenex, nebulizer sent in to use PRN    3. Focal seizures  - f/u with neurologist    4. WPW & Afib  - f/u with cardiologist    5. CVID  - blood draw for IgG level today  - f/u with our clinic regarding initiation of immunoglobulin therapy. Patient has had subcutaneous IgG before and would like to restart.    Pt instructed to f/u in 6 weeks. Xolair may be a discussion at a later time, pending results of doxepin.     Discussed with the allergy fellow Matilde Sprang, D.O. and allergy attending Dr. Sanda Klein who is in agreement with the above plan.    [employer] [facility] [place] [facility]    Matilde Sprang, D.O.  Allergy and Immunology Fellow

## 2016-09-16 NOTE — Unmapped (Signed)
I saw and examined the patient.  I discussed with the resident or fellow and agree with Christena Flake, DO's findings and plan as documented in the note.    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      A&P: Will need to restart replacement IGG    Laqueta Carina, MD

## 2016-09-23 ENCOUNTER — Ambulatory Visit: Admit: 2016-09-23 | Discharge: 2016-09-23 | Payer: PRIVATE HEALTH INSURANCE | Attending: Psychiatry

## 2016-09-23 ENCOUNTER — Other Ambulatory Visit: Admit: 2016-09-23 | Payer: PRIVATE HEALTH INSURANCE

## 2016-09-23 DIAGNOSIS — D839 Common variable immunodeficiency, unspecified: Secondary | ICD-10-CM

## 2016-09-23 DIAGNOSIS — F419 Anxiety disorder, unspecified: Secondary | ICD-10-CM

## 2016-09-23 LAB — IGG: IgG: 647 mg/dL (ref 751.0–1560.0)

## 2016-09-23 NOTE — Unmapped (Signed)
Visit Start Time:  2:00 PM  Visit Quit Time: 2:20 PM      Chief Complaint   Patient presents with   ??? Anxiety     Context: allergic reactions, feeling overwhelmed with the things that need to be done at the house  Location: Altered mental status of mood and anxiety  Duration: acute on chronic  Severity: moderate.  Associated Symptoms: irritability, anxiety  Modifying Factors: optimization of medications, not currently in therapy      History of Present Illness:   Emily Bonilla is a 56 y.o. female with PMH anxiety, atrial fibrillation, WPW, idiopathic mast cell activation, anemia, asthma, hypothyroidism, GERD, intermittent chronic HA, [facility].    Last seen by this provider 08/26/16. At that time increased Buspirone to 20 mg TID, started Vilazodone and prescribed short-course of Clonazepam 1 mg BID for 7 days. Per chart review, the patient recently saw a different allergist at [facility] 09/16/16 who started Doxepin 10 mg daily and Prednisone and Epipen PRN.    HPI        The patient continues to be anxious and perseverative about her medical symptoms however she is less irritable and less hysterical today. She reports her anxiety is improved as compared to her last visit, 5/10 severity as compared to last visit was higher 8.5/10. Thinks the higher dose of Buspirone has been helpful. Denies any SEs. She started Vilazodone but had adverse effects - I just paced, felt sad, hopeless and more anxious! She stopped it and since stopping it denies any symptoms of depression. Denies sad mood, amotivation, anhedonia, hopelessness, low energy, impaired appetite. Sleeping well. Denies SI. Not interested in re-trying another anti-depressant at this time. Denies excessively elevated or irritable mood with increased energy, decreased need for sleep, grandiosity, increase in goal-directed or risky behaviors. Denies AH/VH. Continues to perseverate about her medical symptoms - talks about how she had a  spider bite on her leg 3 weeks ago and shows me a picture of what her leg looked like. Continues to talk about how when she has a allergic reaction, this worsens her anxiety. Is taking very high daily doses of anti-histamines - reports 250 mg of Benadryl daily, Allegra 180 mg daily, Zertec 10 mg TID, Doxepin 10 mg daily and Xantac 900 mg daily. Talks about how she recently saw a new allergist who started her on Doxepin and also gave steroids to use PRN for any reaction she might have. Also talks about how she had 2 focal seizures this past week they tend to come with the allergic reactions. 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 Saw Dr. Nedra Hai for therapy and says she is going to get a referral for integrative medicine to do massage therapy and acupuncture. Has a CM at [facility] and is looking into assisted living. Denies any substance use.      Patient History:     Psychiatric History:     Prior Inpatient Psychiatric Hospitalization(s): denies  Prior Psychotropic Medications: reports taking prior psychotropic medications.  Current Psychotropic Medications: yes - see below current psychotropic medications.  SA: denies    Past Medical History:   Diagnosis Date   ??? Anaphylaxis    ??? Anemia    ??? Asthma    ??? Atrial fibrillation (CMS Dx)    ??? Bronchitis    ??? Heart disease    ??? Neurological disease    ??? OSA (obstructive sleep  apnea)     states resolved   ??? Osteoporosis    ??? Spontaneous pneumothorax     states pneumomediatsium/pneumopericardium, self resolved   ??? Thyroid disease    ??? Ulcer (CMS Dx)    ??? WPW (Wolff-Parkinson-White syndrome)        Past Surgical History:   Procedure Laterality Date   ??? BONE MARROW BIOPSY      benign   ??? CARDIAC ELECTROPHYSIOLOGY MAPPING AND ABLATION      attempted x 2, states not successful either time   ??? CHOLECYSTECTOMY     ??? FACIAL COSMETIC SURGERY     ??? HYSTERECTOMY     ??? LIVER BIOPSY      benign hepatic adednoma per patient   ??? REMOVAL HARDWARE LEG Left  08/17/2016    Procedure: REMOVAL OF HARDWARE LEFT TIBIA;  Surgeon: Andee Lineman, MD;  Location: [facility] OR;  Service: Orthopedics;  Laterality: Left;   ??? TIBIA FRACTURE SURGERY     ??? TONSILLECTOMY      + adenoids       Social History     Social History   ??? Marital status: Divorced     Spouse name: N/A   ??? Number of children: N/A   ??? Years of education: N/A     Social History Main Topics   ??? Smoking status: Never Smoker   ??? Smokeless tobacco: Never Used   ??? Alcohol use No   ??? Drug use: No   ??? Sexual activity: Not Asked     Other Topics Concern   ??? None     Social History Narrative   ??? None       Family History   Problem Relation Age of Onset   ??? Kidney failure Mother    ??? Aneurysm Mother        Review of Systems  Denies fevers, CP, SOB, abdominal pain, n/v, dysuria.     Allergies:   Acetaminophen; Aspirin; Blue dye; Epinephrine; Flecainide; Gabapentin; Iodinated contrast- oral and iv dye; Isopropyl alcohol; Latex; Lidocaine; Nsaids (non-steroidal anti-inflammatory drug); Onion; Other; Perfume; Pregabalin; Quetiapine; Red dye; Sulfur; Yellow dye; Doxepin; Duloxetine; Epi e-z pen; Hydroxyzine; Levofloxacin; Montelukast; Verapamil (bulk); Adhesive tape-silicones; and Trazodone    Medications:     Outpatient Encounter Prescriptions as of 09/23/2016   Medication Sig Dispense Refill   ??? busPIRone (BUSPAR) 10 MG tablet Take 2 tablets (20 mg total) by mouth 3 times a day. Indications: Generalized Anxiety Disorder 180 tablet 3   ??? cromolyn (GASTROCROM) 100 mg/5 mL solution Take 10 mLs (200 mg total) by mouth 3 times a day. 960 mL 0   ??? diphenhydrAMINE (BENADRYL) 50 MG tablet Take 50 mg by mouth 3 times a day.     ??? doxepin (SINEQUAN) 10 MG capsule Take 1 capsule (10 mg total) by mouth at bedtime. 90 capsule 3   ??? fexofenadine (ALLEGRA) 180 MG tablet Take 1 tablet (180 mg total) by mouth daily. 30 tablet 3   ??? furosemide (LASIX) 20 MG tablet Take 1 tablet (20 mg total) by mouth daily. 180 tablet 1   ??? levalbuterol (XOPENEX) 1.25  mg/3 mL nebulizer solution Inhale 3 mL (1.25 mg total) by nebulization every 6 hours as needed for Wheezing. 72 mL 0   ??? levothyroxine (SYNTHROID, LEVOTHROID) 112 MCG tablet Take one tablet by mouth once daily 90 tablet 3   ??? montelukast (SINGULAIR) 10 mg tablet Take 1 tablet (10 mg total) by mouth at bedtime. 90 tablet  1   ??? nebulizer and compressor Devi Use 1 ampule as directed every 4 hours. Indications: Please dispense 1 nebulizer unit 1 each 0   ??? omeprazole (PRILOSEC) 40 MG capsule Take 40 mg by mouth.     ??? onabotulinumtoxinA (BOTOX COSMETIC) 50 unit SolR Inject into the muscle.     ??? predniSONE (DELTASONE) 20 MG tablet Take 1 tablet (20 mg total) by mouth daily. 30 tablet 0   ??? ranitidine (ZANTAC) 300 MG tablet Take 1 tablet (300 mg total) by mouth 3 times a day. 90 tablet 11   ??? triamcinolone (KENALOG) 0.1 % ointment Apply topically 2 times a day. 30 g 3   ??? [DISCONTINUED] vilazodone 10 mg (7)- 20 mg (23) DsPk Take 10 mg by mouth daily for 7 days, then take 20 mg daily thereafter. 30 tablet 2     Facility-Administered Encounter Medications as of 09/23/2016   Medication Dose Route Frequency Provider Last Rate Last Dose   ??? ciprofloxacin lactate (CIPRO) 400 mg in dextrose 5% in water (D5W) 200 mL IVPB  400 mg Intravenous 2 times per day Andee Lineman, MD            Outpatient Medications Prior to Visit   Medication Sig Dispense Refill   ??? busPIRone (BUSPAR) 10 MG tablet Take 2 tablets (20 mg total) by mouth 3 times a day. Indications: Generalized Anxiety Disorder 180 tablet 3   ??? cromolyn (GASTROCROM) 100 mg/5 mL solution Take 10 mLs (200 mg total) by mouth 3 times a day. 960 mL 0   ??? diphenhydrAMINE (BENADRYL) 50 MG tablet Take 50 mg by mouth 3 times a day.     ??? doxepin (SINEQUAN) 10 MG capsule Take 1 capsule (10 mg total) by mouth at bedtime. 90 capsule 3   ??? fexofenadine (ALLEGRA) 180 MG tablet Take 1 tablet (180 mg total) by mouth daily. 30 tablet 3   ??? furosemide (LASIX) 20 MG tablet Take 1 tablet  (20 mg total) by mouth daily. 180 tablet 1   ??? levalbuterol (XOPENEX) 1.25 mg/3 mL nebulizer solution Inhale 3 mL (1.25 mg total) by nebulization every 6 hours as needed for Wheezing. 72 mL 0   ??? levothyroxine (SYNTHROID, LEVOTHROID) 112 MCG tablet Take one tablet by mouth once daily 90 tablet 3   ??? montelukast (SINGULAIR) 10 mg tablet Take 1 tablet (10 mg total) by mouth at bedtime. 90 tablet 1   ??? nebulizer and compressor Devi Use 1 ampule as directed every 4 hours. Indications: Please dispense 1 nebulizer unit 1 each 0   ??? omeprazole (PRILOSEC) 40 MG capsule Take 40 mg by mouth.     ??? onabotulinumtoxinA (BOTOX COSMETIC) 50 unit SolR Inject into the muscle.     ??? predniSONE (DELTASONE) 20 MG tablet Take 1 tablet (20 mg total) by mouth daily. 30 tablet 0   ??? ranitidine (ZANTAC) 300 MG tablet Take 1 tablet (300 mg total) by mouth 3 times a day. 90 tablet 11   ??? triamcinolone (KENALOG) 0.1 % ointment Apply topically 2 times a day. 30 g 3   ??? vilazodone 10 mg (7)- 20 mg (23) DsPk Take 10 mg by mouth daily for 7 days, then take 20 mg daily thereafter. 30 tablet 2     Facility-Administered Medications Prior to Visit   Medication Dose Route Frequency Provider Last Rate Last Dose   ??? ciprofloxacin lactate (CIPRO) 400 mg in dextrose 5% in water (D5W) 200 mL IVPB  400 mg Intravenous  2 times per day Andee Lineman, MD           Objective:   Physical Exam    Gait and Stations: normal  ??  Mental Status Exam:   Appearance: middle-aged Caucasian female, appears stated age, good hygiene and grooming, fair eye contact, cooperative  Activity: no PMR or PMA, no abnormal movements noted  Orientation: alert, oriented to person, place, purpose  Speech: hyper-verbal and mildly pressured but easily able to be interrupted, at times loud, normal latency of response, fluent with no aphasia  Mood/Affect: anxious. Affect congruent with stated mood, restricted range, anxious but to lessened extent as compared to last visit  Thought  Process: organized, linear, goal-directed, no LOA or FOI  Thought Content: denies SI, talks about significant anxiety related to allergic reactions, very focused on her health symptoms  Perceptions: denies AH/VH, does not appear to be internally preoccupied or RTIS  Attention:fair to conversation  Fund of Knowledge:average  Memory:appropriate to conversation  Insight/Judgement: fair/fair         Assessment/Plan:     Emily Bonilla is a 56 y.o. female with PMH anxiety, atrial fibrillation, WPW, idiopathic mast cell activation, anemia, asthma, hypothyroidism, GERD, intermittent chronic HA, [facility].  ??  The patient's anxiety has improved since last visit with the higher dose of Buspirone. She continues to very preoccupied with her health, particularly her reported allergic reactions and reported mast cell activation syndrome. I communicated with Dr. Elisabeth Most (who reviewed the patient's chart, as Dr. Fayrene Fearing had graduated), who reported that per chart review the patient does not have mastocytosis and her persistent symptoms could be from high-dose anti-histamine regimen that they were trying to wean off. However this conflicts with the documentation 09/16/16 from her new allergists Dr. Robb Matar and Dr. Nicholes Stairs. Will reach out to her current and previous allergists to get clarification about her medical diagnosis, as this is important in fully understanding her psychiatric presentation and symptoms. 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 Will continue regimen as below.    DSM-5 Diagnoses: Unspecified anxiety disorder. R/O GAD. R/O Adjustment disorder with mixed anxiety and depressed mood. Cluster B traits (particularly histrionic). R/O Somatic symptom disorder. R/O Illness anxiety disorder. R/O Factitious disorder  Medical Diagnoses: Atrial fibrillation, WPW, idiopathic mast cell activation, anemia, asthma, hypothyroidism, GERD,  intermittent chronic HA  GAF: 60  ??  # Unspecified anxiety disorder:  -Continue Buspirone 20 mg TID  -Stop Vilazodone given adverse side effects. Patient not interested in re-trialing an anti-depressant at this time  -No clinical indication for benzodiazepine therapy. Given the patient's high-dose daily regimen of anti-histamines, it would not be safe for her to be on a benzodiazepine as well, given risk with combination for delirium, falls, sedation, respiratory depression  -Follow-up with integrative medicine for massage therapy, acupuncture   -Will reach out to Dr. Elisabeth Most, Dr. Joni Reining, Dr. Robb Matar and Dr. Nicholes Stairs at the [facility] for clarification about the patient's medical diagnosis, given two different impressions  -RTC in 8 weeks  ??  Discussed risks/benefits/side effects/alternatives to medications. Patient expressed understanding of and agreement to the current treatment plan.   ??  # Safety: The patient's imminent risk appears low, as she denies SI/HI, intent or plan, no previous SA, CFS, future-oriented and goal-oriented to continue treatment. Patient expressed understanding of resources, including 911 and PES.  ??  # Substance:  -No active issues  ??  # Medical:  -Follow-up with  PCP  ??  Jonelle Sidle, MD  Psychiatry PGY-3  Pager: [phone]  ??  Patient seen, plan discussed and agreed upon by attending Dr. Thomasena Edis

## 2016-09-23 NOTE — Unmapped (Signed)
Please take your medication as prescribed and follow-up in 8 weeks. If you have any questions or concerns, please call [phone].    If feeling suicidal, call the suicide hotline at 513-281-CARE (2273), 911, or return to the emergency room.     SUICIDE PREVENTION RESOURCES:   [facility]:  513-281-CARE [phone]) hotline      [facility]: [phone]      Mobile Crisis Team [phone]   [facility]:  [facility] Consultation & Crisis: [phone]   [facility]: Emergency Crisis Hotline: 513-528-SAVE [phone])   [place]: [facility] Emergency Crisis Line: 517-061-6495   National:  National Hotline: 1-800-273-TALK (8255)      Www.suicidepreventionlifeline.orgg  ------------------------------------------------------------------------------------------------------------------  Discharge Instructions After an Episode of Suicide Threats or Actions   Remove all fireams, weapons (of any kind) or any unneeded medicines that could be used   Take person to follow-up mental health appointments   Have a loved one sign a release of information allowing you to contact the mental health provider   Be direct and talk openly about suicidal thoughts   Be willing to listen. Allow expressions of feelings   Block all inappropriate internet websites and social media   Offer to be available to them at any time   Be available to accompany them to the nearest emergency department   Call other family members or friends to help and offer support       Get help from agencies that specialize in crisis intervention   Using the information here and other community resources, create a suicide safety plan. Example: call support networks, crisis lines, 911, go to emergency room, call Mobile Crisis Team, call mental health provider    Warning Signs:   Severe depression with restlessness   Inability to sleep or sleeping too much   Withdrawing from friends, family and life   Increase alcohol and/or drug use   Giving  away personal items   Anxiety, agitation, rage, or anger   Unpredictable mood swings   Feelings like there is no way out   Hopelessness   Constantly talking or writing about death   Seeking out information about suicide    What Family Members & Friends Should Know:   Suicidal ideas are usually associated with treatable conditions   People who try or actually commit suicide try to let someone know by leaving a note   All suicide threats should be taken seriously   Suicidal ideas occur when people are depressed, intoxicated or irrational   Suicidal thinking can consume a person   Suicide risk increases when a person who has been severely depressed suddenly has more energy   Most common ways of suicide - pills, guns, poisons, hanging, carbon monoxide breathing, jumping off high places, and accidents    How Can I Help When Someone is Threatening Suicide?   Take Action!    1. Take his/her words seriously and respond with compassion    2. Do not leave them alone    3. Call 911 and have person taken to emergency room    4. Accompany the person to the emergency room and provide the physician with information

## 2016-09-28 ENCOUNTER — Ambulatory Visit: Admit: 2016-09-28 | Payer: PRIVATE HEALTH INSURANCE | Attending: Neurology

## 2016-09-28 DIAGNOSIS — R6889 Other general symptoms and signs: Secondary | ICD-10-CM

## 2016-09-28 NOTE — Unmapped (Signed)
Completed paperwork and faxed to number given [name] [name] 09/29/2016 2:10 PM
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
## 2016-09-28 NOTE — Unmapped (Signed)
Completed paperwork and faxed to number given Timm Bonenberger M Donshay Lupinski 09/29/2016 2:10 PM
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
## 2016-09-28 NOTE — Progress Notes (Signed)
Received paperwork from [facility] regarding PO dated 08/25/16 and HHC and POC 08/27/16-10/25/16. Paperwork will be put in the MD's mailbox today for completion and signing Laurey Arrow 09/28/2016 2:23 PM

## 2016-09-28 NOTE — Unmapped (Signed)
Received paperwork from [facility] regarding PO dated 08/20/16 and HHC and POC dated 06/28/16-08/09-18. Paperwork will be put in the MD's mailbox today for completion and signing Karalee Hauter M Janille Draughon 09/28/2016 2:25 PM

## 2016-10-01 ENCOUNTER — Inpatient Hospital Stay: Admit: 2016-10-01 | Discharge: 2016-10-05 | Payer: PRIVATE HEALTH INSURANCE | Attending: Neurology

## 2016-10-01 DIAGNOSIS — R404 Transient alteration of awareness: Secondary | ICD-10-CM

## 2016-10-01 NOTE — Unmapped (Signed)
AMBULATORY ELECTROENCEPHALOGRAM REPORT    Identifying Information:  Name: Emily Bonilla  MRN: [id number]  DOB: 01-29-1960  Interpreting Physician: Cherlynn Perches      Clinical History:  Emily Bonilla is an 56 y.o. female , who was evaluated on 10/01/2016 with a primary diagnosis of spells possibly concerning for seizures. A 3 day aEEG was requested to assist with spell classification.    Past Medical History:  Past Medical History:   Diagnosis Date   ??? Anaphylaxis    ??? Anemia    ??? Asthma    ??? Atrial fibrillation (CMS Dx)    ??? Bronchitis    ??? Heart disease    ??? Neurological disease    ??? OSA (obstructive sleep apnea)     states resolved   ??? Osteoporosis    ??? Spontaneous pneumothorax     states pneumomediatsium/pneumopericardium, self resolved   ??? Thyroid disease    ??? Ulcer (CMS Dx)    ??? WPW (Wolff-Parkinson-White syndrome)        Current Medications:   Current Outpatient Prescriptions   Medication Sig   ??? busPIRone Take 2 tablets (20 mg total) by mouth 3 times a day. Indications: Generalized Anxiety Disorder   ??? cromolyn Take 10 mLs (200 mg total) by mouth 3 times a day.   ??? diphenhydrAMINE Take 50 mg by mouth 3 times a day.   ??? doxepin Take 1 capsule (10 mg total) by mouth at bedtime.   ??? fexofenadine Take 1 tablet (180 mg total) by mouth daily.   ??? furosemide Take 1 tablet (20 mg total) by mouth daily.   ??? levalbuterol Inhale 3 mL (1.25 mg total) by nebulization every 6 hours as needed for Wheezing.   ??? levothyroxine Take one tablet by mouth once daily   ??? montelukast Take 1 tablet (10 mg total) by mouth at bedtime.   ??? nebulizer and compressor Use 1 ampule as directed every 4 hours. Indications: Please dispense 1 nebulizer unit   ??? omeprazole Take 40 mg by mouth.   ??? onabotulinumtoxinA Inject into the muscle.   ??? predniSONE Take 1 tablet (20 mg total) by mouth daily.   ??? ranitidine Take 1 tablet (300 mg total) by mouth 3 times a day.   ??? triamcinolone Apply topically 2 times a day.     Current  Facility-Administered Medications   Medication Dose Frequency Provider Last Dose   ??? ciprofloxacin  400 mg 2 times per day Andee Lineman, MD         aEEG start date and time: 10/01/16, 1054  aEEG end date and time: 10/04/16, 938    Technical Summary:  20 channels of EEG were recorded in a digital format over a 3-day period.  The patient kept a diary of normal activities and any abnormal events and was instructed to press the event button for any abnormal symptoms. Computerized spike and seizure detection was utilized during the entire period of monitoring.      This is an ambulatory EEG performed without video monitoring.    The background rhythm consisted of well-developed, well-regulated 10 Hz alpha activity, maximal over the posterior head regions and reactive to eye opening and closure. Stage II sleep was recorded.    No epileptiform abnormalities were seen.    ??       PUSH-BUTTON EVENTS:  The patient pressed the button multiple times for the following:  d1 11:01:14    Patient Event (likely push button test)  d1 20:45:37  Patient Event (bathroom, diarrhea x 1, ate grapes)  d1 22:51:54    Patient Event (awake all night, sweats all night long x 8, glue!!! Flushing, stomach distended, face swelled, fluid retention, joint pain)  d2 06:36:57    Patient Event (likely same as above)  d2 06:52:43    Patient Event (likely same as above)  d2 07:15:39    Patient Event (wake up)  d2 18:55:26    Patient Event (brush teeth)  d3 11:14:08    Patient Event (after sponge bath, scattered, lost remote, found near tv after 5 min, not my neuro typical episode)  d3 11:14:18    Patient Event (same as above)  d3 12:01:23    Patient Event (flushing skin, panic attack, vision blurry, hot, no cardiac)  d3 16:42:37    Patient Event (nothing in diary)  d3 16:43:10    Patient Event (nothing in diary)  d3 16:43:16    Patient Event (nothing in diary)  d3 17:20:59    Patient Event (ate)  d3 21:24:52    Patient Event (brush teeth)  d4  09:37:28    Patient Event (unknown, not marked in diary)  There were also mentions of allergic episodes over the 3 days several brain fog episodes, ringing in ears, blurry vision, diarrhea, and distention that were not marked with button pushes.  Review of the EEG prior to, during and after the push button events revealed no epileptiform changes and normal EEG during the event.      EEG Interpretation:   Three day ambulatory EEG monitoring is normal in wakefulness and sleep.    No epileptiform abnormalities or electrographic seizures were seen.    There were innumerable push button events as described above for a variety of somatic complaints, including flushing, diarrhea, sweating, and abdominal distention (amongst others). No epileptiform change was seen in association with these events, suggesting they are nonepileptic in nature.    Clinical correlation is recommended.

## 2016-10-02 ENCOUNTER — Inpatient Hospital Stay: Admit: 2016-10-04 | Discharge: 2016-10-04 | Payer: PRIVATE HEALTH INSURANCE | Attending: Neurology

## 2016-10-02 DIAGNOSIS — R930 Abnormal findings on diagnostic imaging of skull and head, not elsewhere classified: Secondary | ICD-10-CM

## 2016-10-03 ENCOUNTER — Inpatient Hospital Stay: Admit: 2016-10-04 | Discharge: 2016-10-04 | Payer: PRIVATE HEALTH INSURANCE | Attending: Neurology

## 2016-10-03 DIAGNOSIS — R930 Abnormal findings on diagnostic imaging of skull and head, not elsewhere classified: Secondary | ICD-10-CM

## 2016-10-04 ENCOUNTER — Inpatient Hospital Stay: Admit: 2016-10-04 | Discharge: 2016-10-04 | Payer: PRIVATE HEALTH INSURANCE | Attending: Neurology

## 2016-10-07 NOTE — Telephone Encounter (Signed)
Called patient to follow up on referral to Dr. Dan Humphreys in Integrative medicine. Writer had contacted Integrative Medicine intake worker who indicated that she is uncertain if someone is monitoring the referral cue. She recommended patient call clinic herself and request referral to Dr. Dan Humphreys and that paperwork will be sent to patient to ensure she is a good fit.     Writer contacted patient and relayed this information. Provided patient with number for integrative medicine. Patient voiced appreciation and stated she plans to call. She denied acute distress or risk.

## 2016-10-08 ENCOUNTER — Ambulatory Visit: Admit: 2016-10-08 | Payer: PRIVATE HEALTH INSURANCE

## 2016-10-08 DIAGNOSIS — R569 Unspecified convulsions: Secondary | ICD-10-CM

## 2016-10-08 NOTE — Unmapped (Signed)
Pt declined flu vaccine.
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
## 2016-10-08 NOTE — Unmapped (Signed)
It was a pleasure seeing you today!

## 2016-10-11 NOTE — Unmapped (Signed)
NPQ scanned and given to Dr. Walker for review.

## 2016-10-12 NOTE — Telephone Encounter (Signed)
Pt's case manger with [employer] called requesting information be faxed to: [facility]     Demographics, H&P along with current med list     Please fax to [phone]    Thank you

## 2016-10-13 NOTE — Telephone Encounter (Signed)
Call to patient to confirm she would like her information sent to [facility]. Patient confirmed and states she would feel more comfortable living at [facility] due to need for more assistance. The assistance she is currently receiving has not been working out despite multiple home health aide agencies.     10/08/16 office visit note, med list, and demographics faxed to [facility] [phone].

## 2016-10-15 ENCOUNTER — Ambulatory Visit: Admit: 2016-10-15 | Payer: PRIVATE HEALTH INSURANCE

## 2016-10-15 DIAGNOSIS — H1031 Unspecified acute conjunctivitis, right eye: Secondary | ICD-10-CM

## 2016-10-15 NOTE — Unmapped (Signed)
Pt refused flu vaccine.

## 2016-10-15 NOTE — Unmapped (Signed)
[facility] Internal Medicine  Faculty Practice Acute Visit   Subjective:   Pre-visit planning done, including pre-visit huddle with nursing and MA staff prior to session.    Patient ID: Emily Bonilla is a 56 y.o. lady with a history of WPW, syncope, anxiety and depression, Mast cell activation syndrome, here for an acute visit for pinkeye.     HPI  Patient states she was mowing the lawn and developed a rash as well as conjunctivitis in her right eye.  No change in vision.  She took 100 mg of Benadryl after exposure.      Conjunctivitis started last night, + itching. No difficulty keeping eye open or closed. She felt there was something in it yesterday, she pulled her eyelid and does not have the sensation of foreign object in her eye anymore.      Patient states her symptoms, including abdominal distension, diarrhea are settling.  Continues to have some pruritis, but overall they are improving.      Social worker is working on getting patient placed at [facility] assisted living facility.     The following portions of the patient's history were reviewed and updated as appropriate: allergies, current medications, past family history, past medical history, past social history, past surgical history and problem list.    Review of Systems  As above, otherwise negative.   Objective:   Physical Exam   Nursing note and vitals reviewed.  Constitutional: She is oriented to person, place, and time.   Pleasant lady, sitting in chair, in no acute distress.    Eyes:   Fundoscopic exam:       The right eye shows no exudate, no hemorrhage and no papilledema.        The left eye shows no exudate, no hemorrhage and no papilledema.   Right eye conjunctival erythema. No lesions.    Neurological: She is alert and oriented to person, place, and time. No cranial nerve deficit.   Skin: Skin is warm and dry.   Psychiatric: She has a normal mood and affect. Her behavior is normal. Judgment and thought content normal.     Assessment  & Plan:   Conjunctivitis: Assessed as new problem.   Likely due to allergy vs viral.  Discussed the ddx.   - saline eye washes.   - continue to monitor  - call/RTC if symptoms persist or worsen.     Anxiety and depression: Assessed as deteriorated. No SI  - continue current regimen  - follow up with psychiatrist.     Allergic reaction: improving.   - will follow up with allergy    Patient understands his/her medication(s). Proper use, potential side effects of these medications,  as well as barriers to using these medications were reviewed and addressed, as were over-the-counter medications and supplements during encounter. Response and side effects to current medications also reviewed.        Court Joy, MD, FACP  Assistant Professor of Clinical Medicine  [facility]  Department of Internal Medicine  10/15/2016   3:46 PM

## 2016-10-15 NOTE — Unmapped (Signed)
It was a pleasure seeing you today!

## 2016-10-18 NOTE — Unmapped (Signed)
Patient had a allergic reaction Thursday night to conjunctivitis came a couple hours of mowing the grass, anxiety 9/10, saw primary care on Friday and had steroids last dosage of prednisone 20 mg. Conjunctivitis is gone, facial swelling gone but anxiety is up and done.  Patient is asking for return call.

## 2016-10-18 NOTE — Unmapped (Signed)
Received approval after review of NP Questionnaire to schedule visit with Dr. Dan Humphreys.  Tried contacting the patient to schedule NPV with Dr. Arnetha Massy.  No answer, left vm.

## 2016-10-18 NOTE — Telephone Encounter (Signed)
Pt is calling and would like to schedule for Acupuncture, pt has [place] dual-medicare and medicaid. Emily Bonilla is participating and so is mediaid, but medicare is not. Are you able to look into this for the Pt? I just want to make sure they get the correct information.

## 2016-10-18 NOTE — Unmapped (Signed)
Called patient, as she had requested a call back. Patient developed conjunctivitis several days ago from mowing the lawn. Has been feeling more anxious. Denies SI. Sent in paperwork for integrative medicine. Is looking forward to doing hypnotherapy and acupuncture. Also found an apartment in an assisted living facility. Is requesting something for anxiety in the meantime until she is able to be seen at the integrative medicine clinic. Discuss with patient that she is already on the maximum daily dose of Buspirone. Discuss with patient that we could re-try an anti-depressant however the patient is against this, as she has previously been sensitive to SEs. Encourage the patient to make a follow-up appointment with Dr. Nedra Hai for psychotherapy in the meantime before she can be be seen at the integrative medicine clinic. Also discuss self-management strategies for her anxiety    -Patient has a follow-up appointment with this provider 11/25/16 at 2:45 PM

## 2016-10-19 NOTE — Unmapped (Signed)
Any type of managed medicare plan like this will not cover Acu due to Traditional Medicare guidelines not recognizing [facility] as eligible providers.  Please inform the Pt that any Acu visits would be self-pay @ $84 a visit.

## 2016-10-19 NOTE — Unmapped (Signed)
Received approved paperwork from Dr. Dan Humphreys.  LVM for patient on 10/18/16 to contact the office to schedule. Patient is scheduled for 10/18/16. Will need insurance verification check.

## 2016-10-21 MED ORDER — cromolyn (GASTROCROM) 100 mg/5 mL solution
100 | ORAL | 0 refills | 24.00000 days | Status: AC
Start: 2016-10-21 — End: 2016-11-05

## 2016-10-25 NOTE — Unmapped (Signed)
Patient is asking for a return call from Dr. Gerilyn Pilgrim. Patient is having extreme anxiety. Patient is going in for admission for epileptic seizure monitoring  on Thursday. Patient is having joint pain, tinnitus and other issues.

## 2016-10-28 ENCOUNTER — Observation Stay
Admit: 2016-10-28 | Discharge: 2016-10-31 | Disposition: A | Discharge status: Home / Self Care | Payer: PRIVATE HEALTH INSURANCE | Source: Ambulatory Visit | Attending: Neurology | Admitting: Neurology

## 2016-10-28 ENCOUNTER — Inpatient Hospital Stay: Admit: 2016-10-28 | Discharge: 2016-10-28 | Payer: PRIVATE HEALTH INSURANCE

## 2016-10-28 DIAGNOSIS — G40219 Localization-related (focal) (partial) symptomatic epilepsy and epileptic syndromes with complex partial seizures, intractable, without status epilepticus: Secondary | ICD-10-CM

## 2016-10-28 LAB — URINALYSIS W/RFL TO MICROSCOPIC
Bilirubin, UA: NEGATIVE
Blood, UA: NEGATIVE
Glucose, UA: NEGATIVE mg/dL
Ketones, UA: NEGATIVE mg/dL
Leukocytes, UA: NEGATIVE
Nitrite, UA: NEGATIVE
Protein, UA: NEGATIVE mg/dL
Specific Gravity, UA: 1.02 (ref 1.005–1.035)
Urobilinogen, UA: <2 mg/dL (ref 0.2–1.9)
pH, UA: 5 (ref 5.0–8.0)

## 2016-10-28 LAB — DIFFERENTIAL
Basophils Absolute: 45 /uL (ref 0–200)
Basophils Relative: 0.4 % (ref 0.0–1.0)
Eosinophils Absolute: 22 /uL (ref 15–500)
Eosinophils Relative: 0.2 % (ref 0.0–8.0)
Lymphocytes Absolute: 2576 /uL (ref 850–3900)
Lymphocytes Relative: 23 % (ref 15.0–45.0)
Monocytes Absolute: 1198 /uL (ref 200–950)
Monocytes Relative: 10.7 % (ref 0.0–12.0)
Neutrophils Absolute: 7358 /uL (ref 1500–7800)
Neutrophils Relative: 65.7 % (ref 40.0–80.0)
nRBC: 0 /100 WBC (ref 0–0)

## 2016-10-28 LAB — HEPATIC FUNCTION PANEL
ALT: 14 U/L (ref 7–52)
AST: 16 U/L (ref 13–39)
Albumin: 4.2 g/dL (ref 3.5–5.7)
Alkaline Phosphatase: 110 U/L (ref 36–125)
Bilirubin, Direct: 0 mg/dL (ref 0.00–0.40)
Bilirubin, Indirect: 0.3 mg/dL (ref 0.00–1.10)
Total Bilirubin: 0.3 mg/dL (ref 0.0–1.5)
Total Protein: 6.6 g/dL (ref 6.4–8.9)

## 2016-10-28 LAB — BASIC METABOLIC PANEL, FASTING
Anion Gap: 9 mmol/L (ref 3–16)
BUN: 10 mg/dL (ref 7–25)
CO2: 28 mmol/L (ref 21–33)
Calcium: 9.2 mg/dL (ref 8.6–10.3)
Chloride: 104 mmol/L (ref 98–110)
Creatinine: 0.66 mg/dL (ref 0.60–1.30)
Glucose, Fasting: 110 mg/dL (ref 70–100)
Osmolality, Calculated: 292 mOsm/kg (ref 278–305)
Potassium: 3.6 mmol/L (ref 3.5–5.3)
Sodium: 141 mmol/L (ref 133–146)
eGFR AA CKD-EPI: >90 See note.
eGFR NONAA CKD-EPI: >90 See note.

## 2016-10-28 LAB — CBC
Hematocrit: 38.5 % (ref 35.0–45.0)
Hemoglobin: 12.8 g/dL (ref 11.7–15.5)
MCH: 29.1 pg (ref 27.0–33.0)
MCHC: 33.2 g/dL (ref 32.0–36.0)
MCV: 87.7 fL (ref 80.0–100.0)
MPV: 8.4 fL (ref 7.5–11.5)
Platelets: 331 10*3/uL (ref 140–400)
RBC: 4.39 10*6/uL (ref 3.80–5.10)
RDW: 13.7 % (ref 11.0–15.0)
WBC: 11.2 10*3/uL (ref 3.8–10.8)

## 2016-10-28 LAB — TSH: TSH: 2.29 u[IU]/mL (ref 0.45–4.12)

## 2016-10-28 MED ORDER — busPIRone (BUSPAR) tablet 20 mg
10 | Freq: Three times a day (TID) | ORAL | Status: AC
Start: 2016-10-28 — End: 2016-10-29
  Administered 2016-10-29: 01:00:00 20 mg via ORAL

## 2016-10-28 MED ORDER — famotidine (PEPCID) tablet 40 mg
20 | Freq: Two times a day (BID) | ORAL | Status: AC
Start: 2016-10-28 — End: 2016-10-31
  Administered 2016-10-29 – 2016-10-31 (×6): 40 mg via ORAL

## 2016-10-28 MED ORDER — pantoprazole (PROTONIX) EC tablet 40 mg
40 | Freq: Two times a day (BID) | ORAL | Status: AC
Start: 2016-10-28 — End: 2016-10-28

## 2016-10-28 MED ORDER — loratadine (CLARITIN) tablet 10 mg
10 | Freq: Every evening | ORAL | Status: AC
Start: 2016-10-28 — End: 2016-10-31

## 2016-10-28 MED ORDER — albuterol (PROVENTIL) nebulizer solution 1.25 mg
2.5 | Freq: Four times a day (QID) | RESPIRATORY_TRACT | Status: AC | PRN
Start: 2016-10-28 — End: 2016-10-31

## 2016-10-28 MED ORDER — famotidine (PEPCID) tablet 40 mg
20 | Freq: Every evening | ORAL | Status: AC
Start: 2016-10-28 — End: 2016-10-28

## 2016-10-28 MED ORDER — sodium chloride flush 10 mL
INTRAMUSCULAR | Status: AC
Start: 2016-10-28 — End: 2016-10-31
  Administered 2016-10-29 – 2016-10-31 (×8): 10 mL via INTRAVENOUS

## 2016-10-28 MED ORDER — famotidine (PEPCID) tablet 20 mg
20 | Freq: Two times a day (BID) | ORAL | Status: AC
Start: 2016-10-28 — End: 2016-10-28

## 2016-10-28 MED ORDER — levothyroxine (SYNTHROID, LEVOTHROID) tablet 112 mcg
112 | Freq: Every day | ORAL | Status: AC
Start: 2016-10-28 — End: 2016-10-31
  Administered 2016-10-29 – 2016-10-31 (×3): 112 ug via ORAL

## 2016-10-28 MED ORDER — pantoprazole (PROTONIX) EC tablet 40 mg
40 | Freq: Two times a day (BID) | ORAL | Status: AC
Start: 2016-10-28 — End: 2016-10-31
  Administered 2016-10-28 – 2016-10-31 (×5): 40 mg via ORAL

## 2016-10-28 MED ORDER — cromolyn (GASTROCROM) solution 200 mg
100 | Freq: Three times a day (TID) | ORAL | Status: AC
Start: 2016-10-28 — End: 2016-10-31
  Administered 2016-10-28 – 2016-10-31 (×8): 200 mg via ORAL

## 2016-10-28 MED ORDER — doxepin (SINEQUAN) capsule 10 mg
10 | Freq: Every evening | ORAL | Status: AC
Start: 2016-10-28 — End: 2016-10-31
  Administered 2016-10-29 – 2016-10-31 (×3): 10 mg via ORAL

## 2016-10-28 MED ORDER — heparin (porcine) injection 5,000 Units
5000 | Freq: Three times a day (TID) | INTRAMUSCULAR | Status: AC
Start: 2016-10-28 — End: 2016-10-31
  Administered 2016-10-29 – 2016-10-31 (×8): 5000 [IU] via SUBCUTANEOUS

## 2016-10-28 MED ORDER — cetirizine (ZYRTEC) tablet 10 mg
10 | Freq: Every day | ORAL | Status: AC
Start: 2016-10-28 — End: 2016-10-31
  Administered 2016-10-29 – 2016-10-31 (×4): 10 mg via ORAL

## 2016-10-28 MED ORDER — furosemide (LASIX) tablet 20 mg
20 | Freq: Every day | ORAL | Status: AC
Start: 2016-10-28 — End: 2016-10-31
  Administered 2016-10-29 – 2016-10-31 (×3): 20 mg via ORAL

## 2016-10-28 MED ORDER — diphenhydrAMINE (BENADRYL) capsule 50 mg
25 | Freq: Three times a day (TID) | ORAL | Status: AC
Start: 2016-10-28 — End: 2016-10-29
  Administered 2016-10-29: 50 mg via ORAL

## 2016-10-28 MED ORDER — predniSONE (DELTASONE) tablet 20 mg
20 | Freq: Every day | ORAL | Status: AC
Start: 2016-10-28 — End: 2016-10-31
  Administered 2016-10-29 – 2016-10-31 (×3): 20 mg via ORAL

## 2016-10-28 MED ORDER — montelukast (SINGULAIR) tablet 10 mg
10 | Freq: Every evening | ORAL | Status: AC
Start: 2016-10-28 — End: 2016-10-31
  Administered 2016-10-29 – 2016-10-31 (×3): 10 mg via ORAL

## 2016-10-28 MED FILL — PREDNISONE 20 MG TABLET: 20 20 MG | ORAL | Qty: 1

## 2016-10-28 MED FILL — BUSPIRONE 10 MG TABLET: 10 10 MG | ORAL | Qty: 2

## 2016-10-28 MED FILL — DIPHENHYDRAMINE 25 MG CAPSULE: 25 25 mg | ORAL | Qty: 2

## 2016-10-28 MED FILL — DOXEPIN 10 MG CAPSULE: 10 10 MG | ORAL | Qty: 1

## 2016-10-28 MED FILL — HEPARIN (PORCINE) 5,000 UNIT/ML INJECTION SOLUTION: 5000 5,000 unit/mL | INTRAMUSCULAR | Qty: 1

## 2016-10-28 MED FILL — LEVOTHYROXINE 112 MCG TABLET: 112 112 MCG | ORAL | Qty: 1

## 2016-10-28 MED FILL — FAMOTIDINE 20 MG TABLET: 20 20 MG | ORAL | Qty: 1

## 2016-10-28 MED FILL — MONTELUKAST 10 MG TABLET: 10 10 mg | ORAL | Qty: 1

## 2016-10-28 MED FILL — FAMOTIDINE 20 MG TABLET: 20 20 MG | ORAL | Qty: 2

## 2016-10-28 MED FILL — LORATADINE 10 MG TABLET: 10 10 mg | ORAL | Qty: 1

## 2016-10-28 MED FILL — CETIRIZINE 10 MG TABLET: 10 10 MG | ORAL | Qty: 1

## 2016-10-28 NOTE — Unmapped (Signed)
CMU orders reconciled with ongoing CMU monitoring at this time.

## 2016-10-28 NOTE — Unmapped (Signed)
PROLONGED VIDEO ELECTROENCEPHALOGRAM REPORT  Epilepsy Monitoring Unit    Identifying Information:  Name: Emily Bonilla  MRN: [id number]  DOB: 06-Sep-1960  Attending Physician: Gearlean Alf, MD  Interpreting Physician: Gearlean Alf, MD  Reporting Physician : Kathryne Sharper, MD      Clinical History:  Emily Bonilla is an 56 y.o. female , who was admitted on 10/28/2016 with a primary diagnosis of spells    Past Medical History:  Past Medical History:   Diagnosis Date   ??? Anaphylaxis    ??? Anemia    ??? Anxiety    ??? Asthma    ??? Atrial fibrillation (CMS Dx)    ??? Bronchitis    ??? Heart disease    ??? Neurological disease    ??? OSA (obstructive sleep apnea)     states resolved   ??? Osteoporosis    ??? Spontaneous pneumothorax     states pneumomediatsium/pneumopericardium, self resolved   ??? Thyroid disease    ??? Ulcer    ??? WPW (Wolff-Parkinson-White syndrome)        vEEG start date and time: 10/28/2016 1205  vEEG end date and time: 10/31/2016 1029    Technical Summary:  32 channels of EEG were recorded in a digital format with simultaneous video monitoring.  Spike and seizure detection was utilized during the entire period of monitoring.      No epileptiform abnormalities were seen.    During prolonged video/EEG monitoring the patient did not have typical events, but had 1 event consisting of the following:    Event A Oct 28, 2016     14:34:13    fanning herself with right hand and looking at her phone held in    left hand   14:34:25    hot flash   14:34:39    read the sign   14:34:49    no eeg changeevents    The EKG monitoring channel did not detect any significant rhythm abnormalities.    EEG Interpretation:   Prolonged video/EEG monitoring captured 1 event consisting of hot flash.     No EEG change was seen in association with these events, suggesting they are nonepileptic in nature.  Notable, the event she was admitted to [facility] for capturing was not seen. The absence of interictal epileptiform abnormalities on multiple  EEG evaluations does not support a diagnosis of epilepsy.     Clinical correlation is recommended.  I have personally reviewed the EEG in its entirety with the resident/fellow and I agree with this report.    11/03/2016  8:30 PM  Electronically signed by Youlanda Roys
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
## 2016-10-28 NOTE — Unmapped (Signed)
Attempted to deliver moon form at 1656 on 10/28/16, sign on door ststed Do not enter, EEG in progress

## 2016-10-28 NOTE — Unmapped (Signed)
Identifying Information:  Name: Emily Bonilla  MRN: [id number]  DOB: Jan 21, 1960  Interpreting Physician: Gearlean Alf, MD  Reporting Physician: Kathryne Sharper, MD  Referring Physician: Gearlean Alf, MD  Date of EEG: 10/28/2016  Procedure Location:In    Clinical History:  Emily Bonilla is a 56 y.o. female with a reported history of seziures who was referred for spell capture.    Current Antiepileptic Medications:   Current Facility-Administered Medications   Medication Dose Frequency Provider Last Dose   ??? albuterol  1.25 mg RT Q6H PRN Claudell Kyle, CNP     ??? busPIRone  20 mg TID Gearlean Alf, MD 20 mg at 10/29/16 1252   ??? cetirizine  10 mg Daily 0900 Perrin Smack, MD 10 mg at 10/29/16 0857   ??? cromolyn  200 mg TID AC Claudell Kyle, CNP 200 mg at 10/29/16 1149   ??? diphenhydrAMINE  50 mg TID Gearlean Alf, MD 50 mg at 10/29/16 1254   ??? doxepin  10 mg Nightly (2100) Claudell Kyle, CNP 10 mg at 10/28/16 2049   ??? famotidine  40 mg BID Perrin Smack, MD 40 mg at 10/29/16 0857   ??? fexofenadine  180 mg Nightly (2100) Claudell Kyle, CNP     ??? furosemide  20 mg Daily 0900 Claudell Kyle, CNP 20 mg at 10/29/16 0857   ??? heparin (porcine)  5,000 Units Q8H Claudell Kyle, CNP 5,000 Units at 10/29/16 1254   ??? levothyroxine  112 mcg DAILY 0600 Claudell Kyle, CNP 112 mcg at 10/29/16 5409   ??? loratadine  10 mg Nightly (2100) Claudell Kyle, CNP     ??? montelukast  10 mg Nightly (2100) Claudell Kyle, CNP 10 mg at 10/28/16 2030   ??? pantoprazole  40 mg BID Claudell Kyle, CNP 40 mg at 10/28/16 1934   ??? predniSONE  20 mg Daily 0900 Claudell Kyle, CNP 20 mg at 10/29/16 0857   ??? sodium chloride  10 mL QS Claudell Kyle, CNP 10 mL at 10/29/16 1255       Indication:  Spell capture    Technical Summary:  20 channels of EEG were recorded in a digital format on a patient who was reported to be awake and drowsy during the recording.    The PDR consisted of well-developed, well-regulated 10 Hz alpha activity, maximal over the posterior head  regions and reactive to eye opening and closure. Photic stimulation and hyperventilation  performed and produced no abnormalities. During the recording stage II sleep was not seen. The EKG lead revealed no rhythm abnormalities. Small sharp spikes of drowsiness seen over bitemporal lobes.    EEG Interpretation:   This EEG was within normal limits for a patient of this age in the awake and drowsy state. No focal, lateralizing, or epileptiform features were seen during the recording.    Clinical correlation is recommended.

## 2016-10-29 MED ORDER — fexofenadineALLEGRAtablet180mg
180 | Freq: Every evening | ORAL | Status: AC
Start: 2016-10-29 — End: 2016-10-31
  Administered 2016-10-30 – 2016-10-31 (×2): 180 mg via ORAL

## 2016-10-29 MED ORDER — diphenhydrAMINE (BENADRYL) capsule 50 mg
25 | Freq: Three times a day (TID) | ORAL | Status: AC
Start: 2016-10-29 — End: 2016-10-31
  Administered 2016-10-29 – 2016-10-31 (×7): 50 mg via ORAL

## 2016-10-29 MED ORDER — busPIRone (BUSPAR) tablet 20 mg
10 | Freq: Three times a day (TID) | ORAL | Status: AC
Start: 2016-10-29 — End: 2016-10-31
  Administered 2016-10-29 – 2016-10-31 (×7): 20 mg via ORAL

## 2016-10-29 MED FILL — LORATADINE 10 MG TABLET: 10 10 mg | ORAL | Qty: 1

## 2016-10-29 MED FILL — DIPHENHYDRAMINE 25 MG CAPSULE: 25 25 mg | ORAL | Qty: 2

## 2016-10-29 MED FILL — HEPARIN (PORCINE) 5,000 UNIT/ML INJECTION SOLUTION: 5000 5,000 unit/mL | INTRAMUSCULAR | Qty: 1

## 2016-10-29 MED FILL — LEVOTHYROXINE 112 MCG TABLET: 112 112 MCG | ORAL | Qty: 1

## 2016-10-29 MED FILL — PREDNISONE 20 MG TABLET: 20 20 MG | ORAL | Qty: 1

## 2016-10-29 MED FILL — MONTELUKAST 10 MG TABLET: 10 10 mg | ORAL | Qty: 1

## 2016-10-29 MED FILL — BUSPIRONE 10 MG TABLET: 10 10 MG | ORAL | Qty: 2

## 2016-10-29 MED FILL — CETIRIZINE 10 MG TABLET: 10 10 MG | ORAL | Qty: 1

## 2016-10-29 MED FILL — PANTOPRAZOLE 40 MG TABLET,DELAYED RELEASE: 40 40 MG | ORAL | Qty: 1

## 2016-10-29 MED FILL — FAMOTIDINE 20 MG TABLET: 20 20 MG | ORAL | Qty: 2

## 2016-10-29 MED FILL — DOXEPIN 10 MG CAPSULE: 10 10 MG | ORAL | Qty: 1

## 2016-10-29 MED FILL — FUROSEMIDE 20 MG TABLET: 20 20 MG | ORAL | Qty: 1

## 2016-10-29 NOTE — Unmapped (Signed)
CMU orders reconciled with ongoing CMU monitoring at this time.

## 2016-10-29 NOTE — Unmapped (Signed)
PROGRESS NOTE 10/29/2016    CAse reviewed with Dr Gearlean Alf.  Reviewed case and developed plan for today.     No events overnight.  Id not sleep well.  Episode of anxiety this morning over therapeutic exchanges for medications. Went over each of the medications to assure her for safety and adequate treatment of her complaints.     Lab Results   Component Value Date    WBC 11.2 (H) 10/28/2016    HGB 12.8 10/28/2016    HCT 38.5 10/28/2016    MCV 87.7 10/28/2016    PLT 331 10/28/2016   high absolute moncytes but percentage is normal. Monocytes chronically elevated.       Lab Results   Component Value Date    ALKPHOS 110 10/28/2016    ALT 14 10/28/2016    AST 16 10/28/2016    BILITOT 0.3 10/28/2016    ALBUMIN 4.2 10/28/2016    BILIDIRECT 0.00 10/28/2016    PROT 6.6 10/28/2016     Lab Results   Component Value Date    TSH 2.29 10/28/2016     Urinalysis: normal sample.             Impression/Recommendations: She is a 56 y.o. 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 Spells of convulsions have tended to occur when she has missed doses of antihistamine, been exposed to known allergen.  Spells of LOC may have been associated with arrhythmia.   ??  ??  EPILEPSY  1. Admit to the EMU for continuous video EEG (cvEEG) for spell capture of GTC like events since staring spells have been determined to be non epileptic in past.    2. Antiepileptic drug regimen: NONE  3. Initiation of intermittent photic stimulation. No HV for hx of asthma.    4. Repeat sleep deprivation tonight.   6. Basic labs: completed and reviewed. TSH, urinalysis, CBC and diff, hepatic panel unremarkable except for chronic elevated monocytes.   7. Admission EKG  ordered but not done.  We do have an ECG from 08/11/2016 with SR, prolonged QTc  8. Seizure precautions/ fall precautions.   ??  GERD/GI  H2 blocker ranitidine 300 TID changed to famotidine 20 mg TID  PPI omeprazole changed to pantoprazole 40 mg BID AC.   Cromolyn from home supply  ??  Anxiety  Continue buspar 20 mg TID  ??  MAST CELL ACTIVATION SYNDROME  Diphenhydramine 50 mg TID  Prednisone 20 mg daily  Claritin in AM  Fexofenadine ay bedtime.   ??  ASTHMA  Prn albuterol inhaler  Montelukast at bedtime  ??  HYPOTHYROID  TSH- level is normal  Continue lexothyroxine  ??  ??  Diet: regular  Prophylaxis: heparin subcut  Dispo: inpatient EEG monitoring unit for 3 days to attempt to capture convulsive spells and monitor interictal EEG  ??  Rocky Crafts NP    Update of plan for 10/12.  NP services will not be billed.  Dr Alveda Reasons will bill for services provided to day.  Please see H&P noted dated, 10/28/2016 authored by me for attestation.

## 2016-10-29 NOTE — Unmapped (Signed)
MOON NOTICE GIVEN TO PATIENT. SIGNED COPY TO REGISTRATION FOR SCANNING.

## 2016-10-30 MED ORDER — neomycin-bacitracin-polymyxin (NEOSPORIN) ointment
Freq: Every day | TOPICAL | Status: AC
Start: 2016-10-30 — End: 2016-10-31
  Administered 2016-10-30 – 2016-10-31 (×2): via TOPICAL

## 2016-10-30 MED FILL — HEPARIN (PORCINE) 5,000 UNIT/ML INJECTION SOLUTION: 5000 5,000 unit/mL | INTRAMUSCULAR | Qty: 1

## 2016-10-30 MED FILL — PREDNISONE 20 MG TABLET: 20 20 MG | ORAL | Qty: 1

## 2016-10-30 MED FILL — DIPHENHYDRAMINE 25 MG CAPSULE: 25 25 mg | ORAL | Qty: 2

## 2016-10-30 MED FILL — PANTOPRAZOLE 40 MG TABLET,DELAYED RELEASE: 40 40 MG | ORAL | Qty: 1

## 2016-10-30 MED FILL — BUSPIRONE 10 MG TABLET: 10 10 MG | ORAL | Qty: 2

## 2016-10-30 MED FILL — FAMOTIDINE 20 MG TABLET: 20 20 MG | ORAL | Qty: 2

## 2016-10-30 MED FILL — DOXEPIN 10 MG CAPSULE: 10 10 MG | ORAL | Qty: 1

## 2016-10-30 MED FILL — TRIPLE ANTIBIOTIC 3.5 MG-400 UNIT-5,000 UNIT/GRAM TOPICAL OINTMENT: TOPICAL | Qty: 14.17

## 2016-10-30 MED FILL — LORATADINE 10 MG TABLET: 10 10 mg | ORAL | Qty: 1

## 2016-10-30 MED FILL — MONTELUKAST 10 MG TABLET: 10 10 mg | ORAL | Qty: 1

## 2016-10-30 MED FILL — FUROSEMIDE 20 MG TABLET: 20 20 MG | ORAL | Qty: 1

## 2016-10-30 MED FILL — CETIRIZINE 10 MG TABLET: 10 10 MG | ORAL | Qty: 1

## 2016-10-30 MED FILL — LEVOTHYROXINE 112 MCG TABLET: 112 112 MCG | ORAL | Qty: 1

## 2016-10-30 NOTE — Unmapped (Signed)
PROGRESS NOTE 10/30/2016    No typical events overnight.  DId not sleep well.  Episode of anxiety this morning over therapeutic exchanges for medications. Went over each of the medications to assure her for safety and adequate treatment of her complaints.     Lab Results   Component Value Date    WBC 11.2 (H) 10/28/2016    HGB 12.8 10/28/2016    HCT 38.5 10/28/2016    MCV 87.7 10/28/2016    PLT 331 10/28/2016   high absolute moncytes but percentage is normal. Monocytes chronically elevated.       Lab Results   Component Value Date    ALKPHOS 110 10/28/2016    ALT 14 10/28/2016    AST 16 10/28/2016    BILITOT 0.3 10/28/2016    ALBUMIN 4.2 10/28/2016    BILIDIRECT 0.00 10/28/2016    PROT 6.6 10/28/2016     Lab Results   Component Value Date    TSH 2.29 10/28/2016     Urinalysis: normal sample.       Impression/Recommendations: She is a 56 y.o. 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 Spells of convulsions have tended to occur when she has missed doses of antihistamine, been exposed to known allergen.  Spells of LOC may have been associated with arrhythmia.   ??  ??  EPILEPSY  1. Admit to the EMU for continuous video EEG (cvEEG) for spell capture of GTC like events since staring spells have been determined to be non epileptic in past.    2. Antiepileptic drug regimen: NONE  3. Initiation of intermittent photic stimulation. No HV for hx of asthma.    4. Repeat sleep deprivation tonight.   6. Basic labs: completed and reviewed. TSH, urinalysis, CBC and diff, hepatic panel unremarkable except for chronic elevated monocytes.   7. Admission EKG ordered but not done.  We do have an ECG from 08/11/2016 with SR, prolonged QTc  8. Seizure  precautions/ fall precautions.   ??  GERD/GI  H2 blocker ranitidine 300 TID changed to famotidine 20 mg TID  PPI omeprazole changed to pantoprazole 40 mg BID AC.   Cromolyn from home supply  ??  Anxiety  Continue buspar 20 mg TID  ??  MAST CELL ACTIVATION SYNDROME  Diphenhydramine 50 mg TID  Prednisone 20 mg daily  Claritin in AM  Fexofenadine ay bedtime.   ??  ASTHMA  Prn albuterol inhaler  Montelukast at bedtime  ??  HYPOTHYROID  TSH- level is normal  Continue lexothyroxine  ??  ??  Diet: regular  Prophylaxis: heparin subcut  Dispo: inpatient EEG monitoring unit for 3 days to attempt to capture convulsive spells and monitor interictal EEG  ??  On this day 10/30/2016  I saw, examined, and was physically present for the services provided. I agree with the resident's plan and notes except for those listed on my note. Patient said she had major anxiety attack last night which she attributes to the improperly cooked chicken she was served for dinner. She speaks extensively about the inflammation she experiences, but none of her complaints sound like seizures. The EEG was within normal variability, showing benign epileptiform transients of sleep, a normal variant. We discussed whether she was aware of any ways to  bring on a seizure, but she only mentioned her food allergies, which I am reluctant to try as a trigger for fear of anaphylaxis. She denies starting spells that she describes as missing time. She is off all antiepileptic drugs. Her exam including mental status, coordination, cranial nerves, and motor function are normal. Plan: Continue video/EEG in an attempt to capture and characterize events.     Youlanda Roys

## 2016-10-31 ENCOUNTER — Inpatient Hospital Stay
Admit: 2016-10-31 | Discharge: 2016-11-01 | Disposition: A | Discharge status: Home / Self Care | Payer: PRIVATE HEALTH INSURANCE

## 2016-10-31 DIAGNOSIS — F411 Generalized anxiety disorder: Secondary | ICD-10-CM

## 2016-10-31 DIAGNOSIS — F6810 Factitious disorder, unspecified: Secondary | ICD-10-CM

## 2016-10-31 MED ORDER — hydrOXYzine HCl (ATARAX) 25 MG tablet
25 | ORAL_TABLET | Freq: Three times a day (TID) | ORAL | 0 refills | Status: AC | PRN
Start: 2016-10-31 — End: 2016-11-04

## 2016-10-31 MED ORDER — clonazePAM (klonoPIN) tablet 0.5 mg
1 | Freq: Once | ORAL | Status: AC
Start: 2016-10-31 — End: 2016-10-31
  Administered 2016-10-31: 23:00:00 0.5 mg via ORAL

## 2016-10-31 MED FILL — HEPARIN (PORCINE) 5,000 UNIT/ML INJECTION SOLUTION: 5000 5,000 unit/mL | INTRAMUSCULAR | Qty: 1

## 2016-10-31 MED FILL — CLONAZEPAM 1 MG TABLET: 1 1 MG | ORAL | Qty: 1

## 2016-10-31 MED FILL — PANTOPRAZOLE 40 MG TABLET,DELAYED RELEASE: 40 40 MG | ORAL | Qty: 1

## 2016-10-31 MED FILL — FAMOTIDINE 20 MG TABLET: 20 20 MG | ORAL | Qty: 2

## 2016-10-31 MED FILL — DIPHENHYDRAMINE 25 MG CAPSULE: 25 25 mg | ORAL | Qty: 2

## 2016-10-31 MED FILL — FUROSEMIDE 20 MG TABLET: 20 20 MG | ORAL | Qty: 1

## 2016-10-31 NOTE — Unmapped (Signed)
ED Attending Attestation Note    Date of service:  10/31/2016    This patient was seen by the advanced practice provider.  I have seen and examined the patient, agree with the workup, evaluation, management and diagnosis.  The care plan has been discussed and I concur.      My assessment reveals a 56 y.o. female who presents due to anxiety.  The patient has a long history of psychiatric disease and anxiety and feels like her anxiety is flaring up.  This feels identical to prior anxiety attacks.  She denies any suicidal or homicidal ideations.  She denies any ingestions.  She recently had an evaluation for seizures that was negative.    Jules Husbands, MD

## 2016-10-31 NOTE — ED Provider Notes (Signed)
[facility] ED Note  Date of Service: 10/31/2016  Reason for Visit: Anxiety      Patient History     HPI:  Emily Bonilla is a 56 y.o. female who presents to the Emergency Department with a chief complaint of anxiety. Patient presents stating we have to do something about this anxiety. Patient reports a long-standing history of anxiety and is currently take Buspar. Patient reports she feels so anxious that she feels like she cannot live alone. Patient reports she is filling out an application for assisted living because of the anxiety. Patient reports she has been on Klonopin in the past, is not taking any currently.  Patient denies any SI or HI.  Patient reports a history of anaphylaxis and her constant worry over what is going to trigger her worsened her anxiety.  No somatic complaints including fever, chest pain, shortness of breath.       Past Medical History:   Diagnosis Date    Anaphylaxis     Anemia     Asthma     Atrial fibrillation (CMS Dx)     Bronchitis     Heart disease     Neurological disease     OSA (obstructive sleep apnea)     states resolved    Osteoporosis     Spontaneous pneumothorax     states pneumomediatsium/pneumopericardium, self resolved    Thyroid disease     Ulcer     WPW (Wolff-Parkinson-White syndrome)        Past Surgical History:   Procedure Laterality Date    BONE MARROW BIOPSY      benign    CARDIAC ELECTROPHYSIOLOGY MAPPING AND ABLATION      attempted x 2, states not successful either time    CHOLECYSTECTOMY      FACIAL COSMETIC SURGERY      HYSTERECTOMY      LIVER BIOPSY      benign hepatic adednoma per patient    REMOVAL HARDWARE LEG Left 08/17/2016    Procedure: REMOVAL OF HARDWARE LEFT TIBIA;  Surgeon: Andee Lineman, MD;  Location: [facility] OR;  Service: Orthopedics;  Laterality: Left;    TIBIA FRACTURE SURGERY      TONSILLECTOMY      + adenoids       Family History   Problem Relation Age of Onset    Kidney failure Mother     Aneurysm Mother         Emily Bonilla  reports that she has never smoked. She has never used smokeless tobacco. She reports that she does not drink alcohol or use drugs.    Discharge Medication List as of 10/31/2016  8:01 PM      CONTINUE these medications which have NOT CHANGED    Details   busPIRone (BUSPAR) 10 MG tablet Take 2 tablets (20 mg total) by mouth 3 times a day. Indications: Generalized Anxiety Disorder, Starting Thu 08/26/2016, Normal      cromolyn (GASTROCROM) 100 mg/5 mL solution TAKE BY MOUTH 3 TIMES DAILY, Normal, Disp-480 mL, R-0      doxepin (SINEQUAN) 10 MG capsule Take 1 capsule (10 mg total) by mouth at bedtime., Starting Thu 09/16/2016, Normal, Disp-90 capsule, R-3      fexofenadine (ALLEGRA) 180 MG tablet Take 1 tablet (180 mg total) by mouth daily., Starting Thu 09/16/2016, Normal, Disp-30 tablet, R-3      furosemide (LASIX) 20 MG tablet Take 1 tablet (20 mg total) by mouth daily., Starting Mon 09/13/2016,  Normal, Disp-180 tablet, R-1      levalbuterol (XOPENEX) 1.25 mg/3 mL nebulizer solution Inhale 3 mL (1.25 mg total) by nebulization every 6 hours as needed for Wheezing., Starting Thu 09/16/2016, Normal, Disp-72 mL, R-0      levothyroxine (SYNTHROID, LEVOTHROID) 112 MCG tablet Take one tablet by mouth once daily, Normal      montelukast (SINGULAIR) 10 mg tablet Take 1 tablet (10 mg total) by mouth at bedtime., Starting Thu 09/16/2016, Normal, Disp-90 tablet, R-1      nebulizer and compressor Devi Use 1 ampule as directed every 4 hours. Indications: Please dispense 1 nebulizer unit, Starting Thu 09/16/2016, Normal, Disp-1 each, R-0      omeprazole (PRILOSEC) 40 MG capsule Take 40 mg by mouth., Historical Med      onabotulinumtoxinA (BOTOX COSMETIC) 50 unit SolR Inject into the muscle., Historical Med      predniSONE (DELTASONE) 20 MG tablet Take 1 tablet (20 mg total) by mouth daily., Starting Thu 09/16/2016, Normal, Disp-30 tablet, R-0      ranitidine (ZANTAC) 300 MG tablet Take 1 tablet (300 mg total) by  mouth 3 times a day., Starting Thu 09/16/2016, Normal, Disp-90 tablet, R-11      triamcinolone (KENALOG) 0.1 % ointment Apply topically 2 times a day., Starting Thu 09/16/2016, Normal, Disp-30 g, R-3             Allergies:   Allergies as of 10/31/2016 - Fully Reviewed 10/31/2016   Allergen Reaction Noted    Acetaminophen Anaphylaxis 11/17/2015    Aspirin Anaphylaxis 09/27/2015    Blue dye Anaphylaxis 11/17/2015    Epinephrine Anaphylaxis 11/25/2015    Flecainide Shortness Of Breath 02/06/2016    Gabapentin Anaphylaxis 11/17/2015    Iodinated contrast- oral and iv dye Anaphylaxis 11/07/2015    Isopropyl alcohol Anaphylaxis 11/07/2015    Latex Anaphylaxis and Rash 09/27/2015    Lidocaine Anaphylaxis 09/27/2015    Nsaids (non-steroidal anti-inflammatory drug) Anaphylaxis 11/07/2015    Onion Anaphylaxis 11/17/2015    Other Anaphylaxis 09/27/2015    Perfume Anaphylaxis 11/07/2015    Pregabalin Anaphylaxis 09/27/2015    Quetiapine Anaphylaxis 11/07/2015    Red dye Anaphylaxis 11/07/2015    Sulfur Anaphylaxis 11/25/2015    Yellow dye Anaphylaxis 11/07/2015    Doxepin Other (See Comments) 11/17/2015    Duloxetine  11/17/2015    Epi e-z pen  01/19/1996    Hydroxyzine Itching 11/07/2015    Levofloxacin Itching 11/07/2015    Montelukast  11/07/2015    Verapamil (bulk) Hives 02/06/2016    Adhesive tape-silicones Itching, Rash, and Swelling 09/27/2015    Trazodone Rash 11/07/2015       Review of Systems     Review of Systems   Constitutional: Negative.  Negative for fever.   Respiratory: Negative.  Negative for shortness of breath.    Cardiovascular: Negative.  Negative for chest pain.   Gastrointestinal: Negative.  Negative for abdominal pain.   Musculoskeletal: Negative.    Neurological: Negative.  Negative for speech change, focal weakness and headaches.   Psychiatric/Behavioral: Negative for suicidal ideas. The patient is nervous/anxious.    All other systems reviewed and are  negative.          Physical Exam     General: Well-developed well-nourished in no acute distress, very anxious, unable to sit still.  HEENT: Head normocephalic atraumatic.  Pupils equal and round.  External ears and nose normal.   Neck: Full range of motion, supple.  Pulmonary: Lungs clear to auscultation bilaterally.  No wheezing, rhonchi, or rales.  Cardiac: Regular rate and rhythm.  No murmurs, rubs, or gallops.  Clear S1, S2.  Musculoskeletal: Moving all extremities appropriately, no obvious weakness noted.    Vascular: Palpable pulses to all extremities.  No pitting edema noted.  Skin: Warm and dry.  Neuro: Alert and oriented x 4.  Cranial nerves II through XII without deficit.  No facial droop or dysarthria.  Psych:  Very anxious. No SI/HI. No hallucinations.    ED Course and MDM     MEDICAL DECISION MAKING    RECENT VITALS:  BP: (!) 155/91, Temp: 98.5 F (36.9 C), Heart Rate: 110, Resp: 18     RADIOLOGY:  No orders to display       LABS:   Labs Reviewed - No data to display    MEDS:  Medications   clonazePAM (klonoPIN) tablet 0.5 mg (0.5 mg Oral Given 10/31/16 1907)         PROCEDURES: N/A    CONSULTS:  None    MEDICAL DECISION MAKING / ED COURSE:    The patient was seen and examined by myself and presented to Dr. No att. providers found, who also examined the patient and agrees with assessment and plan.    Emily Bonilla is a 56 y.o. female who presents to the Emergency Department with a chief complaint of anxiety.  Patient is extremely anxious here, unable to sit still.  Patient is afebrile, mildly tachycardic, hypertensive.  Patient without any somatic complaints.  Patient was given Klonopin in the ER.  This had minimal effect.  I did discuss the patient's complaint with our psych social worker who agrees that the patient does not qualify for a psychiatric hold, but is welcome to go to [facility] voluntarily if she feels like she needs emergent evaluation.  Patient was discharged to [facility] and escorted to the  facility. Return precautions discussed.    The patient tolerated their visit well.  They were seen and evaluated by the attending physician who agreed with the assessment and plan.  The patient and / or the family were informed of the results of any tests, a time was given to answer questions, a plan was proposed and they agreed with plan.      DISCHARGE DIAGNOSIS:  1. Anxiety state        PATIENT REFERRED TO:  Court Joy, MD  [address]  [place] [address]  [phone]    Schedule an appointment as soon as possible for a visit       [facility]  [address]  [place] [address]  [phone]    As needed, If symptoms worsen      DISCHARGE MEDICATIONS:  Discharge Medication List as of 10/31/2016  8:01 PM            Critical Care Time (Attendings)          Kathyrn Sheriff, [place]  10/31/16 2026

## 2016-10-31 NOTE — Unmapped (Signed)
Go to psychiatric emergency services for further evaluation.  Return to ER for any emergent concerns.

## 2016-10-31 NOTE — Unmapped (Signed)
Living With Anxiety  After being diagnosed with an anxiety disorder, you may be relieved to know why you have felt or behaved a certain way. It is natural to also feel overwhelmed about the treatment ahead and what it will mean for your life. With care and support, you can manage this condition and recover from it.  How to cope with anxiety  Dealing with stress  Stress is your body???s reaction to life changes and events, both good and bad. Stress can last just a few hours or it can be ongoing. Stress can play a major role in anxiety, so it is important to learn both how to cope with stress and how to think about it differently.  Talk with your health care provider or a counselor to learn more about stress reduction. He or she may suggest some stress reduction techniques, such as:  ?? Music therapy. This can include creating or listening to music that you enjoy and that inspires you.  ?? Mindfulness-based meditation. This involves being aware of your normal breaths, rather than trying to control your breathing. It can be done while sitting or walking.  ?? Centering prayer. This is a kind of meditation that involves focusing on a word, phrase, or sacred image that is meaningful to you and that brings you peace.  ?? Deep breathing. To do this, expand your stomach and inhale slowly through your nose. Hold your breath for 3-5 seconds. Then exhale slowly, allowing your stomach muscles to relax.  ?? Self-talk. This is a skill where you identify thought patterns that lead to anxiety reactions and correct those thoughts.  ?? Muscle relaxation. This involves tensing muscles then relaxing them.  Choose a stress reduction technique that fits your lifestyle and personality. Stress reduction techniques take time and practice. Set aside 5-15 minutes a day to do them. Therapists can offer training in these techniques. The training may be covered by some insurance plans. Other things you can do to manage stress include:  ?? Keeping a stress  diary. This can help you learn what triggers your stress and ways to control your response.  ?? Thinking about how you respond to certain situations. You may not be able to control everything, but you can control your reaction.  ?? Making time for activities that help you relax, and not feeling guilty about spending your time in this way.  Therapy combined with coping and stress-reduction skills provides the best chance for successful treatment.  Medicines  Medicines can help ease symptoms. Medicines for anxiety include:  ?? Anti-anxiety drugs.  ?? Antidepressants.  ?? Beta-blockers.  Medicines may be used as the main treatment for anxiety disorder, along with therapy, or if other treatments are not working. Medicines should be prescribed by a health care provider.  Relationships  Relationships can play a big part in helping you recover. Try to spend more time connecting with trusted friends and family members. Consider going to couples counseling, taking family education classes, or going to family therapy. Therapy can help you and others better understand the condition.  How to recognize changes in your condition  Everyone has a different response to treatment for anxiety. Recovery from anxiety happens when symptoms decrease and stop interfering with your daily activities at home or work. This may mean that you will start to:  ?? Have better concentration and focus.  ?? Sleep better.  ?? Be less irritable.  ?? Have more energy.  ?? Have improved memory.  It is important to   recognize when your condition is getting worse. Contact your health care provider if your symptoms interfere with home or work and you do not feel like your condition is improving.  Where to find help and support:  You can get help and support from these sources:  ?? Self-help groups.  ?? Online and community organizations.  ?? A trusted spiritual leader.  ?? Couples counseling.  ?? Family education classes.  ?? Family therapy.  Follow these instructions at  home:  ?? Eat a healthy diet that includes plenty of vegetables, fruits, whole grains, low-fat dairy products, and lean protein. Do not eat a lot of foods that are high in solid fats, added sugars, or salt.  ?? Exercise. Most adults should do the following:  ?? Exercise for at least 150 minutes each week. The exercise should increase your heart rate and make you sweat (moderate-intensity exercise).  ?? Strengthening exercises at least twice a week.  ?? Cut down on caffeine, tobacco, alcohol, and other potentially harmful substances.  ?? Get the right amount and quality of sleep. Most adults need 7-9 hours of sleep each night.  ?? Make choices that simplify your life.  ?? Take over-the-counter and prescription medicines only as told by your health care provider.  ?? Avoid caffeine, alcohol, and certain over-the-counter cold medicines. These may make you feel worse. Ask your pharmacist which medicines to avoid.  ?? Keep all follow-up visits as told by your health care provider. This is important.  Questions to ask your health care provider  ?? Would I benefit from therapy?  ?? How often should I follow up with a health care provider?  ?? How long do I need to take medicine?  ?? Are there any long-term side effects of my medicine?  ?? Are there any alternatives to taking medicine?  Contact a health care provider if:  ?? You have a hard time staying focused or finishing daily tasks.  ?? You spend many hours a day feeling worried about everyday life.  ?? You become exhausted by worry.  ?? You start to have headaches, feel tense, or have nausea.  ?? You urinate more than normal.  ?? You have diarrhea.  Get help right away if:  ?? You have a racing heart and shortness of breath.  ?? You have thoughts of hurting yourself or others.  If you ever feel like you may hurt yourself or others, or have thoughts about taking your own life, get help right away. You can go to your nearest emergency department or call:  ?? Your local emergency services (911  in the [place]  ?? A suicide crisis helpline, such as the National Suicide Prevention Lifeline at 1-800-273-8255. This is open 24-hours a day.  Summary  ?? Taking steps to deal with stress can help calm you.  ?? Medicines cannot cure anxiety disorders, but they can help ease symptoms.  ?? Family, friends, and partners can play a big part in helping you recover from an anxiety disorder.  This information is not intended to replace advice given to you by your health care provider. Make sure you discuss any questions you have with your health care provider.  Document Released: 12/30/2015 Document Revised: 12/30/2015 Document Reviewed: 12/30/2015  [employer] Interactive Patient Education ?? 2018 [employer]

## 2016-10-31 NOTE — Unmapped (Signed)
Epilepsy Monitoring Unit - Physician Discharge Summary     Patient ID:  Emily Bonilla  [id number]  56 y.o.  10-31-1960    Admit date: 10/28/2016  Discharge date: 10/31/2016    Admitting Physician: No att. providers found  Discharge Physician: Gearlean Alf, MD  Resident Physician: Deborra Medina    Referring Physician: Hezzie Bump, MD    Admission Diagnoses: INTRACTABLE COMPLEX PARTIAL SEIZURES    Discharge Diagnoses:   Psychogenic Nonepileptiform Spells    Past Medical History:   Diagnosis Date   ??? Anaphylaxis    ??? Anemia    ??? Anxiety    ??? Asthma    ??? Atrial fibrillation (CMS Dx)    ??? Bronchitis    ??? Heart disease    ??? Neurological disease    ??? OSA (obstructive sleep apnea)     states resolved   ??? Osteoporosis    ??? Spontaneous pneumothorax     states pneumomediatsium/pneumopericardium, self resolved   ??? Thyroid disease    ??? Ulcer    ??? WPW (Wolff-Parkinson-White syndrome)        Admission Condition: Stable  Discharged Condition: Stable    Indication for Admission: Differential Diagnosis.    Seizure Semiology/Frequency:  -Drop attacks with some degree of shaking, mostly triggered by strong smells, occurring sporadically about one every few years.  -Staring spells lasting 30s to 2 min and which occur 2 - 3 times per month.  No post ictal confusion.      Hospital Course: [name] is a 56 y.o. 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 was observed in the EMU for 4 days during which she had numerous episodes of anxiety and other transient somatic complaints, non of which had any abnormal EEG correlate.  She did not have any of her spells here either.  From her responses to questionnaires she likely has  moderate anxiety and mild to moderate depression.    Summary of EMU admission and cvEEG findings:  1. Pre-telemetry EEG was within the limits of normal for a patient of her age.   2. Unable to capture typical events. cvEEG was carefully reviewed and showed no abnormalities.  3. Screening revealed a moderate level of anxiety (however after discharge from the EMU she presented to the ED the same day with a complaint of uncontrollable anxiety) and a moderate level of depression.  4. The diagnosis of non-epileptic spells was discussed in detail with the patient. Patient voiced understanding of the diagnosis. Please see below for discharge instructions.   5. Please see Dr. No att. providers found's discharge summary for a review of the patient's events while being monitored in the EMU and a formal EEG read.     Mood and Anxiety Screening;   -NDDI-E of 13 (no depression)   -BDI-II of 20 (moderate depression)   -GAD-7 of 12 (moderate anxiety)     Consults: none.    Significant Diagnostic Studies: continuous video EEG (cvEEG)    Discharge Exam:  Examination unchanged from admission with no neurological abnormalities.    Patient discharge medications:      Nuha, Degner   Home Medication Instructions [id number]    Printed on:10/31/16 1033   Medication Information  busPIRone (BUSPAR) 10 MG tablet  Take 2 tablets (20 mg total) by mouth 3 times a day. Indications: Generalized Anxiety Disorder             cromolyn (GASTROCROM) 100 mg/5 mL solution  TAKE BY MOUTH 3 TIMES DAILY             doxepin (SINEQUAN) 10 MG capsule  Take 1 capsule (10 mg total) by mouth at bedtime.             fexofenadine (ALLEGRA) 180 MG tablet  Take 1 tablet (180 mg total) by mouth daily.             furosemide (LASIX) 20 MG tablet  Take 1 tablet (20 mg total) by mouth daily.             levalbuterol (XOPENEX) 1.25 mg/3 mL nebulizer solution  Inhale 3 mL (1.25 mg total) by nebulization every 6 hours as needed for  Wheezing.             levothyroxine (SYNTHROID, LEVOTHROID) 112 MCG tablet  Take one tablet by mouth once daily             montelukast (SINGULAIR) 10 mg tablet  Take 1 tablet (10 mg total) by mouth at bedtime.             nebulizer and compressor Devi  Use 1 ampule as directed every 4 hours. Indications: Please dispense 1 nebulizer unit             omeprazole (PRILOSEC) 40 MG capsule  Take 40 mg by mouth.             onabotulinumtoxinA (BOTOX COSMETIC) 50 unit SolR  Inject into the muscle.             predniSONE (DELTASONE) 20 MG tablet  Take 1 tablet (20 mg total) by mouth daily.             ranitidine (ZANTAC) 300 MG tablet  Take 1 tablet (300 mg total) by mouth 3 times a day.             triamcinolone (KENALOG) 0.1 % ointment  Apply topically 2 times a day.                 Disposition: Home or Self Care WITH NO Home Care Services.     Activity: The following seizure precautions were discussed with the patient: No driving, no operating heavy machinery, no swimming or tub bathing, and the avoidence of ladders and heights.     Diet: Regular    Follow-up: Neurologist and PCP as scheduled.  Psychiatry follow up recommended.    Other discharge instruction:   1. 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 NDDI-E, BDI-II, and GAD-7 testing was completed, patient tested positive for depression and severe anxiety.   2. The importance of psychiatric evaluation/ professional counseling/ mental health follow-up was stressed with the patient. Patient is agreeable to counseling. If the patient is unable to find such follow-up, they have been instructed to fax the Epilepsy Monitoring Unit (EMU) at [phone] a list of their insurance covered outpatient mental health providers for recommendations. Then, the patient can schedule an appointment.  3. Since the patient experiences a loss in awareness during the spells, the importance of adhering to seizure precautions until the patient is event free and without periodic alterations in  consciousness of 3 months was reinforced.     Anson Oregon, MD  Neurology PGY-2  11/01/2016  10:32 AM

## 2016-10-31 NOTE — Unmapped (Signed)
Pt discharge teaching given to pt.  Pt verbalized understanding and has all of her belongings.  IV and CMU discontinued.  Pt insisted on walking out.

## 2016-10-31 NOTE — Unmapped (Signed)
Dear Ms Emily Bonilla,    You came to the EMU because your doctor was concerned that you were having episodes that might have been seizures.  While you were here you did not have any of these episodes and your continuous video EEG did not show any abnormalities.  As we discussed, your episodes are likely more related to a combination of medication side effects, stress, and other triggers rather than true seizures.  You do not need to take any medication for seizures.    I am restarting all your home medications and not making any changes.    Thank you for letting us take care of you!    -Dr Lucretia Roers and the [facility] Epilepsy Monitoring Unit (EMU) Team

## 2016-10-31 NOTE — Progress Notes (Signed)
Neurology/Epilepsy Attending Discharge Note  Patient will be discharged to home. Video/EEG did not detect any ictal or interictal epileptiform abnormalities. We did not see any of her total body shaking episodes nor did we find any of her staring spells (where she misses time but has no other signs). The interictal EEG was within normal variability, showing benign epileptiform transients of sleep, a normal variant. Patient said she had major anxiety attack one night which she attributes to the improperly cooked chicken she was served for dinner; no EEG changes were seen with this event. She speaks extensively about the inflammation she experiences, but none of her complaints sound like seizures.  We discussed whether she was aware of any ways to bring on a seizure, but she only mentioned her food allergies, which I am reluctant to try as a trigger for fear of anaphylaxis. She was not on any antiepileptic drugs at the time of admission.   Assessment was the complaints of missing time are extremely unlikely to be seizures based on semiology, absence of any interictal EEG abnormalities during this hospitalization, absence of EEG findings on prior epilepsy monitoring unit evaluations in the past and the recent ambulatory EEG, and absence of response to trials of antiepileptic drugs in the past. She had moderate anxiety on GAD and moderate depression on Beck anxiety/depression screening. She has follow up scheduled with immunology/allergy. It is possible that the loss of time episodes are psychogenic nonepileptic seizures and the patient is under treatment for anxiety.

## 2016-10-31 NOTE — Unmapped (Signed)
Reports having severe anxiety for the past 4 hours

## 2016-10-31 NOTE — Unmapped (Signed)
Emily Bonilla is a 26 WF who presents today voluntarily through the lobby after she reports having been at the [facility] tonight for her anxiety and then was referred here to PES. Pt reports that she was given Klonopin at the [employer] and states,   it took the edge off only pt states, The anxiety is over the top. She denies SI/HI reports no past hx, current plan or intent. Denies A/V H    Pt reports that she takes Buspar 60 mg per day for her anxiety and that she has been taking her medications but her trigger today was/is her chronic medical issues. She reports sx as : rocking, pacing, crying and insomnia. Pt states,  I can't go on like this my mind is going to break     She reports that her insomnia has increased over the past couple of months and no issues with appetite.    She reports that she may have an upcoming appointment with Dr Gerilyn Pilgrim on November 8 for follow-up but that is unclear at this time.    She reports that she has limited support here in [place] and that her family is in [place] and [place] and that is her support system.    She is cooperative with triage process and is currently in [facility] awaiting a provider.

## 2016-10-31 NOTE — Unmapped (Signed)
[facility] Psychiatric Emergency  Service Evaluation    Reason for Visit/Chief Complaint: Anxiety      Patient History     HPI  Ms Emily Bonilla is a 56 y/o F with a complex medical history including anaphylaxis, anemia, OSA, WPW, hypothyroidism, PNES, who presents to the lobby by self with concern for increasing anxiety. She was just discharged from EMU with 4 day stay, with multiple recorded episodes with no epileptiform activity. Self presented to [facility], was seen by psych SW and released without SOB. She reports since discharge she has panic attacks, lasting 5-15 min, sweating, feeling of impending doom. Says triggers are her friend heaping her problems on me, as well as eating spicy chicken, smelling propane.  She was given a klonopin in the [facility] and says it took the edge off. but also seems to still believes that she has seizures, despite her negative EMU visit. She denies SI/HI AVH. Mood is so anxious. She is very interested in medical testing/ medications, highly demanding of an immediate fix to her anxiety symptoms, despite her being connected to multiple services, including integrative care, and resident mood disorder clinic (next appt Nov 9th)    She takes Buspar 60mg  daily, says it works, but does not get the breakthrough anxiety.     PES Triage Screening:  Broset score:             PSS- Safety Screen Score: 0  Suicide Screen: Is patient expressing suicidal ideations?: No    Is Admission due to self harm?: No    Has Patient Attempted Suicide or Self Harm in past 72 hours?: No    Is Patient experiencing acute agitation, anxiety or insomnia?  : Yes    Context: stress and adherence with meds  Location: Altered mental status of anxiety  Duration: 3 days.  Severity: moderate .  Associated Symptoms: moderate .  Modifying Factors: other: complex medical history  Timing: Constant. , occurring in episodes as well    Past Psychiatric History: connected to resident mood disorder clinic    Hospitalizations: no.     Past suicide attempts: no.    History of violence: no.    Substance Use History: denies.    PMH:       Past Medical History:   Diagnosis Date   ??? Anaphylaxis    ??? Anemia    ??? Anxiety    ??? Asthma    ??? Atrial fibrillation (CMS Dx)    ??? Bronchitis    ??? Heart disease    ??? Neurological disease    ??? OSA (obstructive sleep apnea)     states resolved   ??? Osteoporosis    ??? Spontaneous pneumothorax     states pneumomediatsium/pneumopericardium, self resolved   ??? Thyroid disease    ??? Ulcer    ??? WPW (Wolff-Parkinson-White syndrome)      I have reviewed the past medical history.  Additional history obtained: no    Social History:    Work History:  employed    Social History     Social History   ??? Marital status: Divorced     Spouse name: N/A   ??? Number of children: N/A   ??? Years of education: N/A     Social History Main Topics   ??? Smoking status: Never Smoker   ??? Smokeless tobacco: Never Used   ??? Alcohol use No   ??? Drug use: No   ??? Sexual activity: Not Currently     Other  Topics Concern   ??? Caffeine Use Yes   ??? Exercise No   ??? Seat Belt Yes     Social History Narrative   ??? None     I have reviewed the past social history.  Additional history obtained: no.    Family History:    Family History   Problem Relation Age of Onset   ??? Kidney failure Mother    ??? Aneurysm Mother      I have reviewed the past family history.  Additional history obtained: no.    Medications:  Previous Medications    BUSPIRONE (BUSPAR) 10 MG TABLET    Take 2 tablets (20 mg total) by mouth 3 times a day. Indications: Generalized Anxiety Disorder    CROMOLYN (GASTROCROM) 100 MG/5 ML SOLUTION    TAKE BY MOUTH 3 TIMES DAILY    DOXEPIN (SINEQUAN) 10 MG CAPSULE    Take 1 capsule (10 mg total) by mouth at bedtime.    FEXOFENADINE (ALLEGRA) 180 MG TABLET    Take 1 tablet (180 mg total) by mouth daily.    FUROSEMIDE (LASIX) 20 MG TABLET    Take 1 tablet (20 mg total) by mouth daily.    LEVALBUTEROL (XOPENEX) 1.25 MG/3 ML NEBULIZER SOLUTION    Inhale 3 mL (1.25  mg total) by nebulization every 6 hours as needed for Wheezing.    LEVOTHYROXINE (SYNTHROID, LEVOTHROID) 112 MCG TABLET    Take one tablet by mouth once daily    MONTELUKAST (SINGULAIR) 10 MG TABLET    Take 1 tablet (10 mg total) by mouth at bedtime.    NEBULIZER AND COMPRESSOR DEVI    Use 1 ampule as directed every 4 hours. Indications: Please dispense 1 nebulizer unit    OMEPRAZOLE (PRILOSEC) 40 MG CAPSULE    Take 40 mg by mouth.    ONABOTULINUMTOXINA (BOTOX COSMETIC) 50 UNIT SOLR    Inject into the muscle.    PREDNISONE (DELTASONE) 20 MG TABLET    Take 1 tablet (20 mg total) by mouth daily.    RANITIDINE (ZANTAC) 300 MG TABLET    Take 1 tablet (300 mg total) by mouth 3 times a day.    TRIAMCINOLONE (KENALOG) 0.1 % OINTMENT    Apply topically 2 times a day.       Allergies:   Allergies as of 10/31/2016 - Fully Reviewed 10/31/2016   Allergen Reaction Noted   ??? Acetaminophen Anaphylaxis 11/17/2015   ??? Aspirin Anaphylaxis 09/27/2015   ??? Blue dye Anaphylaxis 11/17/2015   ??? Epinephrine Anaphylaxis 11/25/2015   ??? Flecainide Shortness Of Breath 02/06/2016   ??? Gabapentin Anaphylaxis 11/17/2015   ??? Iodinated contrast- oral and iv dye Anaphylaxis 11/07/2015   ??? Isopropyl alcohol Anaphylaxis 11/07/2015   ??? Latex Anaphylaxis and Rash 09/27/2015   ??? Lidocaine Anaphylaxis 09/27/2015   ??? Nsaids (non-steroidal anti-inflammatory drug) Anaphylaxis 11/07/2015   ??? Onion Anaphylaxis 11/17/2015   ??? Other Anaphylaxis 09/27/2015   ??? Perfume Anaphylaxis 11/07/2015   ??? Pregabalin Anaphylaxis 09/27/2015   ??? Quetiapine Anaphylaxis 11/07/2015   ??? Red dye Anaphylaxis 11/07/2015   ??? Sulfur Anaphylaxis 11/25/2015   ??? Yellow dye Anaphylaxis 11/07/2015   ??? Doxepin Other (See Comments) 11/17/2015   ??? Duloxetine  11/17/2015   ??? Epi e-z pen  01/19/1996   ??? Hydroxyzine Itching 11/07/2015   ??? Levofloxacin Itching 11/07/2015   ??? Montelukast  11/07/2015   ??? Verapamil (bulk) Hives 02/06/2016   ??? Adhesive tape-silicones Itching, Rash, and Swelling  09/27/2015   ??? Trazodone Rash 11/07/2015       Review of Systems     Review of Systems      Physical Exam/Objective Data     ED Triage Vitals [10/31/16 2026]   Vital Signs Group      Temp 97.9 ??F (36.6 ??C)      Temp Source Oral      Heart Rate 86      Heart Rate Source Automatic      Resp 16      SpO2 95 %      BP 137/83      MAP (mmHg)       BP Location Left arm      BP Method Automatic      Patient Position Sitting   SpO2 95 %   O2 Device None (Room air)       Physical Exam    Mental Status Exam:     Gait and Muscle Strength:  Normal and Muscle strength intact  Appearance and Behavior: Cooperative and Pacing      Groomed and NL Body Habitus  Speech: NL articulation, prosody, volume and production  Language: Naming intact  Mood: dysphoric  Affect: appropriate and constricted  Thought Process and Associations: goal directed and no derailment       No loose associations  Thought Content: no suicidal/homicidal with plans for the future  Abnormal or psychotic thoughts: None  Orientation: person, place, time/date and situation  Memory: recent, remote, and immediate recall intact  Attention and Concentration: intact  Abstraction: Abstraction normal  Fund of Knowledge: average  Insight and Judgement: Minimal     Fair        Labs:    Please see electronic medical record for any tests performed in the ED.    No results found for this or any previous visit (from the past 24 hour(s)).    Radiology and EKG:  No results found.    EKG: Please see electronic medical record for any studies performed in the ED.    Emergency Course and Plan     Russell Engelstad is a 56 y.o. female who presented to the emergency department with Anxiety  which has been a long term, ongoing problem. She has had extensive medical workup for this, and is well connected to services. She appears very interested in obtaining more medical care, but not necissarily med seeking. Denies SI/HI AVH. Stable for discharge. Sent in a prescription for hydroxizine to  take on a PRN basis--and told pt to see if it helped and to discuss with mood disorder provider if it should be continued.     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 See AVS for prescriptions, followup, and discharge instructions.  No emergency medical condition present at discharge.  Patient not deemed to be an imminent threat of harm to self or others.  Patient has a good safety plan and discharge disposition in place. Protective factors: Support System and Connected to [employer].   Summary of rationale for disposition: no SI/HI AVH. Connected to service. Long history of anxiety.    Provider completing note: Resident, supervised by  Dr. Loni Beckwith.    Patient was in [place].     Patient had a completed Statement of Belief during this encounter:no    Medications given in PES: no.  Medications prescribed for home or inpatient use: yes.  Laboratory work ordered: no.  Other diagnostic studies ordered: yes.  Old and/or outside medical records reviewed: yes.  Collateral information contacted: no.  Patient's outside provider contacted: no.         Nicholaus Bloom, MD  Resident  10/31/16 [phone]

## 2016-10-31 NOTE — Unmapped (Signed)
Pt was seen by MD and able to be discharged. She was given discharge instructions and follow up care reviewed. Pt left [facility].

## 2016-11-01 ENCOUNTER — Inpatient Hospital Stay
Admit: 2016-11-01 | Discharge: 2016-11-01 | Disposition: A | Discharge status: Home / Self Care | Payer: PRIVATE HEALTH INSURANCE

## 2016-11-03 ENCOUNTER — Ambulatory Visit: Admit: 2016-11-03 | Discharge: 2016-11-03

## 2016-11-03 DIAGNOSIS — Z5189 Encounter for other specified aftercare: Secondary | ICD-10-CM

## 2016-11-03 NOTE — Unmapped (Signed)
S- Hx many health issues that include severe anxiety, CVID.MSAS, allergic reactions to many things, hx of many falls by passing out, r/o epilepsy. 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 Here today to try massage to help with muscle tension and stress.  O- no obvious restrictions of movement noted. Has many broken teeth and scars to face from falls.  A- is wearing a mask with washcloth in it to start because of her intense reactions to smells. Used vitamin E oil without any scent.  Prone-heat applied to back area to loosen musculature. Worked legs over the blanket. Worked paraspinal with mild pressure tightness to HNS area. Hot stones used to facilitate muscular releases. Worked lumbo-sacral with moderate pressure. Cervical/ suboccipital release done with very mild pressure. Supine- worked HNS with very mild pressure. Ended with footwork.  P- will see how this therapy processes before returning to clinic,

## 2016-11-04 ENCOUNTER — Other Ambulatory Visit: Admit: 2016-11-04 | Payer: PRIVATE HEALTH INSURANCE

## 2016-11-04 ENCOUNTER — Ambulatory Visit: Admit: 2016-11-04 | Payer: PRIVATE HEALTH INSURANCE

## 2016-11-04 DIAGNOSIS — D839 Common variable immunodeficiency, unspecified: Secondary | ICD-10-CM

## 2016-11-04 LAB — IGG: IgG: 665 mg/dL (ref 751.0–1560.0)

## 2016-11-04 LAB — PNEUMOCOCCAL ANTIBODY IGG, 14 SEROTYPE PANEL
Serotype 1 (1): 0.1 ug/mL — ABNORMAL LOW (ref >1.3)
Serotype 1 2 (12F): <0.1 ug/mL — ABNORMAL LOW (ref >1.3)
Serotype 14 (14): 0.2 ug/mL — ABNORMAL LOW (ref >1.3)
Serotype 19 (19F): 0.6 ug/mL — ABNORMAL LOW (ref >1.3)
Serotype 23 (23F): 0.1 ug/mL — ABNORMAL LOW (ref >1.3)
Serotype 26 (6B): 1.1 ug/mL — ABNORMAL LOW (ref >1.3)
Serotype 3 (3): 1 ug/mL — ABNORMAL LOW (ref >1.3)
Serotype 4 (4): 0.2 ug/mL — ABNORMAL LOW (ref >1.3)
Serotype 51 (7F): 0.3 ug/mL — ABNORMAL LOW (ref >1.3)
Serotype 56 (18C): >16.8 ug/mL (ref >1.3)
Serotype 57: 0.7 ug/mL — ABNORMAL LOW (ref >1.3)
Serotype 68 (9V): 0.3 ug/mL — ABNORMAL LOW (ref >1.3)
Serotype 8 (8): 0.1 ug/mL — ABNORMAL LOW (ref >1.3)
Serotype 9 (9N): <0.1 ug/mL — ABNORMAL LOW (ref >1.3)

## 2016-11-04 MED ORDER — hydrOXYzine HCl (ATARAX) 25 MG tablet
25 | ORAL_TABLET | Freq: Three times a day (TID) | ORAL | 3 refills | Status: AC | PRN
Start: 2016-11-04 — End: 2017-02-03

## 2016-11-04 MED ORDER — predniSONE (DELTASONE) 5 MG tablet
5 | ORAL_TABLET | Freq: Every day | ORAL | 0 refills | Status: AC
Start: 2016-11-04 — End: 2017-04-26

## 2016-11-04 NOTE — Unmapped (Signed)
ALLERGY & IMMUNOLOGY Clinic Note     CC: Follow up for mast cell disorder. Last seen 09/16/2016. Main complaint today is fatigue     HPI:  Emily Bonilla is a 56 y.o. female with a complicated PMHx significant for asthma and idiopathic mast cell activation who presents to the allergy clinic today for a f/u visit.     Reported history of mast cell disorder:  Her symptoms previously included hives, flushing, itching, diarrhea, and hypotension during episodes, ever since she was a teenager. She also has dermatographia. Last visit was 6 weeks ago. She has been feeling significantly better since and feels the aforementioned symptoms have been resolving. Pt received 20mg  prednisone for 30 days at the last visit as well as initiation of doxepin. She didn't know to start medications right after last appointment. She started steroids when the last flair started and it helped.She has two weeks remaining of the steroids. She feels food is a trigger for her symptoms as it started after eating baked chicken during last hospitalization. A few weeks ago she had worsening anxiety and presented to the ED. She was started on vistaril and feels it really helped her anxiety. Her main concerns right now is fatigue and joint pains. Diarrhea is better after taking the steroids.     Of note, she is going to [place] to have her teeth removed and for creation of dentures. She has a reported history of contact dermatitis and reactions to metal.     Asthma  She reports no wheezing, & that her asthma has been well controlled.    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 Used to get IVIG, but now that she has moved to [place] (8 mo ago) she needs a new immunologist. In process of getting one. She feels like she's had some skin infection and carbuncles in the previous few weeks. Her IgG supplementation was stopped last year when she was seen at [facility]. She feels it was stopped in the interim due to a  miscommunication.       Current Medications:  Current Outpatient Prescriptions   Medication Sig   ??? busPIRone Take 2 tablets (20 mg total) by mouth 3 times a day. Indications: Generalized Anxiety Disorder   ??? cromolyn TAKE BY MOUTH 3 TIMES DAILY   ??? doxepin Take 1 capsule (10 mg total) by mouth at bedtime.   ??? fexofenadine Take 1 tablet (180 mg total) by mouth daily.   ??? furosemide Take 1 tablet (20 mg total) by mouth daily.   ??? hydrOXYzine HCl Take 1 tablet (25 mg total) by mouth every 8 hours as needed for Anxiety. Indications: anxiety   ??? levalbuterol Inhale 3 mL (1.25 mg total) by nebulization every 6 hours as needed for Wheezing.   ??? levothyroxine Take one tablet by mouth once daily   ??? montelukast Take 1 tablet (10 mg total) by mouth at bedtime.   ??? nebulizer and compressor Use 1 ampule as directed every 4 hours. Indications: Please dispense 1 nebulizer unit   ??? omeprazole Take 40 mg by mouth.   ??? onabotulinumtoxinA Inject into the muscle.   ??? predniSONE Take 1 tablet (20 mg total) by mouth daily.   ??? ranitidine Take 1 tablet (300 mg total) by mouth 3 times a day.   ??? triamcinolone Apply topically 2 times a day.     No current facility-administered medications for this visit.        Review  of Systems:  GENERAL: stable weight  EYES: denies changes in vision, watery or itchy eyes   ENT: denies changes in hearing or dental problems   CARDIOVASCULAR: no palpitations, chest pain  LUNGS: -SOB, -cough   ABDOMEN: -diarrhea   GU: denies dysuria or hematuria  HEME/ONC: denies abnormal bleeding or bruising  SKIN: per HPI  MSK: denies muscle weakness, joint stiffness or joint pain   ENDO: denies heat or cold intolerance  NEURO: denies seizures, headaches or focal neurological deficits  PSYCH: +anxiety      Past Medical History:  Past Medical History:   Diagnosis Date   ??? Anaphylaxis    ??? Anemia    ??? Anxiety    ??? Asthma    ??? Atrial fibrillation (CMS Dx)    ??? Bronchitis    ??? Heart disease    ??? Neurological disease    ???  OSA (obstructive sleep apnea)     states resolved   ??? Osteoporosis    ??? Spontaneous pneumothorax     states pneumomediatsium/pneumopericardium, self resolved   ??? Thyroid disease    ??? Ulcer    ??? WPW (Wolff-Parkinson-White syndrome)        Past Surgical History:  Past Surgical History:   Procedure Laterality Date   ??? BONE MARROW BIOPSY      benign   ??? CARDIAC ELECTROPHYSIOLOGY MAPPING AND ABLATION      attempted x 2, states not successful either time   ??? CHOLECYSTECTOMY     ??? FACIAL COSMETIC SURGERY     ??? HYSTERECTOMY     ??? LIVER BIOPSY      benign hepatic adednoma per patient   ??? REMOVAL HARDWARE LEG Left 08/17/2016    Procedure: REMOVAL OF HARDWARE LEFT TIBIA;  Surgeon: Andee Lineman, MD;  Location: [facility] OR;  Service: Orthopedics;  Laterality: Left;   ??? TIBIA FRACTURE SURGERY     ??? TONSILLECTOMY      + adenoids       Social History:  Tobacco use: none  Alcohol use: none    Allergies:  Allergies   Allergen Reactions   ??? Acetaminophen Anaphylaxis     Unknown   ??? Aspirin Anaphylaxis     Unknown   ??? Blue Dye Anaphylaxis     Unknown   ??? Epinephrine Anaphylaxis   ??? Flecainide Shortness Of Breath     Itching, decreased libido, wheezing, flushing, numbness.    ??? Gabapentin Anaphylaxis   ??? Iodinated Contrast- Oral And Iv Dye Anaphylaxis   ??? Isopropyl Alcohol Anaphylaxis   ??? Latex Anaphylaxis and Rash   ??? Lidocaine Anaphylaxis   ??? Nsaids (Non-Steroidal Anti-Inflammatory Drug) Anaphylaxis   ??? Onion Anaphylaxis   ??? 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   ??? Perfume Anaphylaxis   ??? Pregabalin Anaphylaxis   ??? Quetiapine Anaphylaxis     Mental Status Change   ??? Red Dye Anaphylaxis     Unknown   ??? Sulfur Anaphylaxis     Pt states: Any  alcohol product   ??? Yellow Dye Anaphylaxis     Unknown   ??? Doxepin Other (See Comments)     Unknown   ??? Duloxetine      Other reaction(s): Other (  See Comments)  Mental Status Change   ??? Epi E-Z Pen    ??? Hydroxyzine Itching     Unknown   ??? Levofloxacin Itching   ??? Montelukast    ??? Verapamil (Bulk) Hives     Insomnia, flushing, scattered brain, migraine, itching, anxiety.    ??? Adhesive Tape-Silicones Itching, Rash and Swelling     Unknown   ??? Trazodone Rash       Physical Exam:   Blood pressure 155/71, pulse 85, temperature 97.7 ??F (36.5 ??C), resp. rate 18, height 5' 7 (1.702 m), weight 196 lb (88.9 kg).  GENERAL APPEARANCE: Well developed, well nourished, in no acute distress, alert and oriented.  SKIN: Inspection of the skin reveals no rashes, ulcerations or petechia. Healing scar on L lower leg from titanium removal.  HEENT: Normal turbinates, no evidence of swelling or rhinorrhea. No masses, polyps or pus. No septal perforation.  NECK: Supple and symmetric.  LUNGS: Auscultation of the lungs revealed normal breath sounds, no wheezes or crackles.  CARDIOVASCULAR: Regular rate and rhythm without any murmurs, rubs or gallops.  ABDOMEN: Soft and nontender with normal bowel sounds.  LYMPH NODES:  No lymphadenopathy.  EXTREMITIES:  No cyanosis, clubbing or edema.  NEUROLOGIC: No focal neurological deficits noted.    Assessment and Plan: Melvinia Ashby is a 56 y.o. female with a complicated PMHx significant for asthma and idiopathic mast cell activation who presents to the allergy clinic today for a f/u visit.     1. Reported history of mast cell activation  - We have requested the pt bring her medical records from [place] [facility].   - Continue Doxepin and Hydroxyzine  - Taper Prednisone    2. Asthma  - Currently controlled, not using any medications    3. CVID  - Repeat IgG level  - Pneumo titers    Follow up: 3 months    Emily Bonilla, D.O.  Allergy and Immunology Fellow    Discussed with the allergy attending  Dr. Harley Hallmark who is in agreement with the above plan.

## 2016-11-04 NOTE — Unmapped (Signed)
I saw and examined the patient.  I discussed with the resident or fellow and agree with Christena Flake, DO's findings and plan as documented in the note.    HPI: Pt is a 56 y/o female with h/o asthma, ? Mast cell activation syndrome (dx at [facility]), anxiety, arthralgias/myalgias, focal seizures, carries a dx of CVID with prior IVIG/subC therapy 1 yr ago.    Last seen 6 wks ago in our clinic and was asked to bring her prior records from outside hospital - has yet to get this uploaded into our system.  She was started on Hydroxyzine 25mg  TID as needed and doxepin 10mg  qhs, has been on Prednisone 20mg  daily for the past 2 weeks.  Reports feeling much better from an anxiety and mast cell standpoint.    Physical Exam:  Well appearing female     A&P:  1. H/o Mast cell activation syndrome - need to review records to get a clear understanding of her prior diagnostic studies:  - cont Hydroxyzine 25mg  TID prn and doxepin 10mg  qhs  - cont Prednisone 20mg  x 2 add'l weeks (was prescribed at last visit), then taper off     2. H/o CVID on prior IVIG, again need to review prior records to review/clarify diagnosis:  - repeat IgG and strep pneumo titers today  - review records before ordering IVIG, pt states she will take records to medical records for upload    Hiram Gash, MD

## 2016-11-04 NOTE — Unmapped (Signed)
Addended by: Hayden Pedro A on: 11/05/2016 12:20 PM     Modules accepted: Level of Service

## 2016-11-05 MED ORDER — cromolyn (GASTROCROM) 100 mg/5 mL solution
100 | Freq: Four times a day (QID) | ORAL | 11 refills | 24.00000 days | Status: AC
Start: 2016-11-05 — End: 2017-04-25

## 2016-11-08 NOTE — Unmapped (Signed)
Received paperwork from [facility] regarding Physician Order, for discharge from services, per patient request, dated 09/01/16. Paperwork will be put in the MD's mailbox today for completion and signing Zackory Pudlo 11/08/2016 2:04 PM

## 2016-11-08 NOTE — Unmapped (Signed)
Faxed  to number given. Confirmation received, copy filed. LISA STARKEY11/6/20182:50 PM

## 2016-11-09 MED ORDER — predniSONE (DELTASONE) 20 MG tablet
20 | ORAL_TABLET | ORAL | 0 refills | Status: AC
Start: 2016-11-09 — End: 2019-05-17

## 2016-11-12 NOTE — Unmapped (Signed)
Patient calling to see what it is that she should be doing with her prednisone prescription she was under the impression that she was starting a taper and then stopping the steroids all together.  She can be reached at [phone]

## 2016-11-15 ENCOUNTER — Other Ambulatory Visit: Admit: 2016-11-15 | Payer: PRIVATE HEALTH INSURANCE

## 2016-11-15 ENCOUNTER — Inpatient Hospital Stay
Admit: 2016-11-15 | Discharge: 2016-11-15 | Disposition: A | Discharge status: Home / Self Care | Payer: PRIVATE HEALTH INSURANCE

## 2016-11-15 DIAGNOSIS — D849 Immunodeficiency, unspecified: Secondary | ICD-10-CM

## 2016-11-15 DIAGNOSIS — T7840XA Allergy, unspecified, initial encounter: Secondary | ICD-10-CM

## 2016-11-15 LAB — MISCELLANEOUS REFERENCE TEST: Test Name: 812166

## 2016-11-15 LAB — IMMUNOGLOBULIN G SUBCLASSES PANEL 1
IgG: 616 mg/dL (ref 700–1600)
Immuno G Subclass 1: 287 mg/dL (ref 248–810)
Immuno G Subclass 2: 268 mg/dL (ref 130–555)
Immuno G Subclass 3: 37 mg/dL (ref 15–102)
Immuno G Subclass 4: 37 mg/dL (ref 2–96)

## 2016-11-15 LAB — TETANUS TOXIN ANTIBODY: Tetanus Ab: 0.4 [IU]/mL (ref <0.10)

## 2016-11-15 MED ORDER — diphenhydrAMINE (BENADRYL) injection 50 mg
50 | Freq: Once | INTRAMUSCULAR | Status: AC
Start: 2016-11-15 — End: 2016-11-15

## 2016-11-15 MED ORDER — methylPREDNISolone sod suc(PF) (SOLU-medrol) 125 mg/2 mL SolR
125 | INTRAMUSCULAR | Status: AC
Start: 2016-11-15 — End: 2016-11-15
  Administered 2016-11-15: 18:00:00 125 via INTRAVENOUS

## 2016-11-15 MED ORDER — diphenhydrAMINE (BENADRYL) 50 mg/mL injection
50 | INTRAMUSCULAR | Status: AC
Start: 2016-11-15 — End: 2016-11-15
  Administered 2016-11-15: 18:00:00 50 via INTRAVENOUS

## 2016-11-15 MED ORDER — racepinephrine (VAPONEFRIN) 2.25 % nebulizer solution 0.5 mL
2.25 | Freq: Once | RESPIRATORY_TRACT | Status: AC
Start: 2016-11-15 — End: 2016-11-15
  Administered 2016-11-15: 18:00:00 0.5 mL via RESPIRATORY_TRACT

## 2016-11-15 MED ORDER — sodium chloride 0.9 % nebulizer solution 3 mL
0.9 | Freq: Once | RESPIRATORY_TRACT | Status: AC
Start: 2016-11-15 — End: 2016-11-15
  Administered 2016-11-15: 18:00:00 3 mL via RESPIRATORY_TRACT

## 2016-11-15 MED ORDER — famotidine (PF) (PEPCID) 20 mg/2 mL injection
20 | INTRAVENOUS | Status: AC
Start: 2016-11-15 — End: 2016-11-15
  Administered 2016-11-15: 18:00:00 20 via INTRAVENOUS

## 2016-11-15 MED ORDER — famotidine (PF) (PEPCID) injection 20 mg
20 | Freq: Once | INTRAVENOUS | Status: AC
Start: 2016-11-15 — End: 2016-11-15

## 2016-11-15 MED ORDER — methylPREDNISolone sod suc(PF) (SOLU-medrol) SolR 125 mg
125 | Freq: Once | INTRAMUSCULAR | Status: AC
Start: 2016-11-15 — End: 2016-11-15

## 2016-11-15 MED ORDER — hydrOXYzine HCl (ATARAX) 25 MG tablet
25 | ORAL_TABLET | Freq: Three times a day (TID) | ORAL | 3 refills | Status: AC | PRN
Start: 2016-11-15 — End: 2017-02-03
  Filled 2016-11-15: qty 20, 7d supply, fill #0

## 2016-11-15 MED FILL — FAMOTIDINE (PF) 20 MG/2 ML INTRAVENOUS SOLUTION: 20 20 mg/2 mL | INTRAVENOUS | Qty: 2

## 2016-11-15 MED FILL — SOLU-MEDROL (PF) 125 MG/2 ML SOLUTION FOR INJECTION: 125 125 mg/2 mL | INTRAMUSCULAR | Qty: 2

## 2016-11-15 MED FILL — DIPHENHYDRAMINE 50 MG/ML INJECTION SOLUTION: 50 50 mg/mL | INTRAMUSCULAR | Qty: 1

## 2016-11-15 NOTE — Unmapped (Signed)
Respiratory Therapy at bedside for Racepinephrine breathing treatment.  Patient tolerating well at this time.  Patient presents to the Emergency Department with report of trouble breathing and allergic reaction to rubbing alcohol at MD office.  Patient reports she was at MD office for routine lab draw.  Patient noted with upper airway wheezing upon initial assessment.  Patient speaking in full and complete sentences.  Patient endorses joint pain and spasms to legs, airway, and shoulders.  Patient maintaining airway, no drooling noted.  Respirations remains shallow, tachypneic and slightly labored.  Skin warm and diaphoretic.  Patient placed on cardiac monitor and continuous pulse oximetry.  NSR noted wihtout ectopy.  Dr. Clarene Reamer at bedside.  Will continue to monitor.

## 2016-11-15 NOTE — Unmapped (Signed)
Patient resting quietly in bed with eyes open.  Patient reports feeling much better.  Patient denies chest pain, SOB, or trouble breathing.  Patient denies nausea or bloating at this time.  Respirations easy and unlabored.  Skin warm and dry.  No acute distress noted.  NSR remains via monitor continues.  Will continue to monitor.

## 2016-11-15 NOTE — Unmapped (Signed)
56 year old female arrived in the [facility] from her MD's office.  Social worker met with patient who was alert and sitting up.  Patient stated that she had an allergic reaction that is common for her. Patient also stated that she does noot have any family or friends in the area and that she will call Benedetto Goad when she's discharged.    Britta Mccreedy Valerie Cones,LISW-S

## 2016-11-15 NOTE — Unmapped (Signed)
Patient resting quietly in bed with eyes open.  Patient reports pain in joints has decreased at this time.  Patient denies trouble breathing or trouble breathing.  Respirations easy and unlabored.  Skin warm and dry.  No acute distress noted.  NSR remains via monitor without ectopy.  Will continue to monitor.

## 2016-11-15 NOTE — Unmapped (Signed)
Return for new chest pain or shortness of breath or any other concern that you would like to have evaluated by a doctor

## 2016-11-15 NOTE — Unmapped (Addendum)
Blood drawn per protocol and sent to lab for hold.

## 2016-11-15 NOTE — Unmapped (Signed)
[facility] ED Note    Date of service:  11/15/2016    Reason for Visit: Respiratory Distress    Patient History     HPI:  This is a 56 y.o. female with a history as noted below presenting with allergic reaction.  History is obtained from the patient and rapid response team.     56 YO female that presents with concern for allergic reaction.  Patient was in [place] for blood draw when she reported allergic reaction to alcohol swab.  Reports that she inhaled the fumes and had immediate shortness of breath.  Rapid response called and patient brought to ED.  Started on O2 en route.  Patient speaking in multiple complete sentences.  Hx of multiple immunologic/allergy conditions.    Patient reports hx of intermittent allergy to rubbing alcohol.  States that it depends on the amount of histamine in my system.     With the exception of the above, there are no other exacerbating, alleviating, or associated factors.      Past Medical History:   Diagnosis Date   ??? Anaphylaxis    ??? Anemia    ??? Anxiety    ??? Asthma    ??? Atrial fibrillation (CMS Dx)    ??? Bronchitis    ??? Heart disease    ??? Neurological disease    ??? OSA (obstructive sleep apnea)     states resolved   ??? Osteoporosis    ??? Spontaneous pneumothorax     states pneumomediatsium/pneumopericardium, self resolved   ??? Thyroid disease    ??? Ulcer    ??? WPW (Wolff-Parkinson-White syndrome)        Past Surgical History:   Procedure Laterality Date   ??? BONE MARROW BIOPSY      benign   ??? CARDIAC ELECTROPHYSIOLOGY MAPPING AND ABLATION      attempted x 2, states not successful either time   ??? CHOLECYSTECTOMY     ??? FACIAL COSMETIC SURGERY     ??? HYSTERECTOMY     ??? LIVER BIOPSY      benign hepatic adednoma per patient   ??? REMOVAL HARDWARE LEG Left 08/17/2016    Procedure: REMOVAL OF HARDWARE LEFT TIBIA;  Surgeon: Andee Lineman, MD;  Location: [facility] OR;  Service: Orthopedics;  Laterality: Left;   ??? TIBIA FRACTURE SURGERY     ???  TONSILLECTOMY      + adenoids       Emily Bonilla  reports that she has never smoked. She has never used smokeless tobacco. She reports that she does not drink alcohol or use drugs.    Previous Medications    BUSPIRONE (BUSPAR) 10 MG TABLET    Take 2 tablets (20 mg total) by mouth 3 times a day. Indications: Generalized Anxiety Disorder    CROMOLYN (GASTROCROM) 100 MG/5 ML SOLUTION    Take 5 mLs (100 mg total) by mouth 4 times daily before meals and at bedtime.    DOXEPIN (SINEQUAN) 10 MG CAPSULE    Take 1 capsule (10 mg total) by mouth at bedtime.    FEXOFENADINE (ALLEGRA) 180 MG TABLET    Take 1 tablet (180 mg total) by mouth daily.    FUROSEMIDE (LASIX) 20 MG TABLET    Take 1 tablet (20 mg total) by mouth daily.    HYDROXYZINE HCL (ATARAX) 25 MG TABLET    Take 1 tablet (25 mg total) by mouth every 8 hours as needed for Anxiety. Indications: anxiety    LEVALBUTEROL (XOPENEX)  1.25 MG/3 ML NEBULIZER SOLUTION    Inhale 3 mL (1.25 mg total) by nebulization every 6 hours as needed for Wheezing.    LEVOTHYROXINE (SYNTHROID, LEVOTHROID) 112 MCG TABLET    Take one tablet by mouth once daily    MONTELUKAST (SINGULAIR) 10 MG TABLET    Take 1 tablet (10 mg total) by mouth at bedtime.    NEBULIZER AND COMPRESSOR DEVI    Use 1 ampule as directed every 4 hours. Indications: Please dispense 1 nebulizer unit    OMEPRAZOLE (PRILOSEC) 40 MG CAPSULE    Take 40 mg by mouth.    ONABOTULINUMTOXINA (BOTOX COSMETIC) 50 UNIT SOLR    Inject into the muscle.    PREDNISONE (DELTASONE) 20 MG TABLET    TAKE ONE TABLET BY MOUTH DAILY.    PREDNISONE (DELTASONE) 5 MG TABLET    Take 1 tablet (5 mg total) by mouth daily. Take 3 tablets (15 mg) for 5 days, then 2 tablets (10 mg), then 1 tablet (5 mg) and then stop.    RANITIDINE (ZANTAC) 300 MG TABLET    Take 1 tablet (300 mg total) by mouth 3 times a day.    TRIAMCINOLONE (KENALOG) 0.1 % OINTMENT    Apply topically 2 times a day.       Allergies:   Allergies as of 11/15/2016 - Fully Reviewed  11/04/2016   Allergen Reaction Noted   ??? Acetaminophen Anaphylaxis 11/17/2015   ??? Aspirin Anaphylaxis 09/27/2015   ??? Blue dye Anaphylaxis 11/17/2015   ??? Epinephrine Anaphylaxis 11/25/2015   ??? Flecainide Shortness Of Breath 02/06/2016   ??? Gabapentin Anaphylaxis 11/17/2015   ??? Iodinated contrast- oral and iv dye Anaphylaxis 11/07/2015   ??? Isopropyl alcohol Anaphylaxis 11/07/2015   ??? Latex Anaphylaxis and Rash 09/27/2015   ??? Lidocaine Anaphylaxis 09/27/2015   ??? Nsaids (non-steroidal anti-inflammatory drug) Anaphylaxis 11/07/2015   ??? Onion Anaphylaxis 11/17/2015   ??? Other Anaphylaxis 09/27/2015   ??? Perfume Anaphylaxis 11/07/2015   ??? Pregabalin Anaphylaxis 09/27/2015   ??? Quetiapine Anaphylaxis 11/07/2015   ??? Red dye Anaphylaxis 11/07/2015   ??? Sulfur Anaphylaxis 11/25/2015   ??? Yellow dye Anaphylaxis 11/07/2015   ??? Doxepin Other (See Comments) 11/17/2015   ??? Duloxetine  11/17/2015   ??? Epi e-z pen  01/19/1996   ??? Hydroxyzine Itching 11/07/2015   ??? Levofloxacin Itching 11/07/2015   ??? Montelukast  11/07/2015   ??? Verapamil (bulk) Hives 02/06/2016   ??? Adhesive tape-silicones Itching, Rash, and Swelling 09/27/2015   ??? Trazodone Rash 11/07/2015       PMH: Nursing notes reviewed   PSH: Nursing notes reviewed   FH: Nursing notes reviewed   MEDS: Nursing notes and chart reviewed     Review of Systems     ROS:  The patient reports shortness of breath as above.  Denies LOC.  All other systems reviewed and were negative except as mentioned in HPI.    Physical Exam     ED Triage Vitals [11/15/16 1350]   Vital Signs Group      Temp 98.5 ??F (36.9 ??C)      Temp Source Oral      Heart Rate 104      Heart Rate Source Monitor      Resp 18      SpO2 97 %      BP 121/71      MAP (mmHg)       BP Location Left arm      BP Method  Automatic      Patient Position Lying   SpO2 97 %   O2 Device Aerosol Mask       Physical Exam    General:   well nourished; well developed;appears to have increased work of breathing, but speaking in full paragraphs  without difficulty  HEENT:  Normocephalic, atraumatic; pupils equal, round and reactive; moist mucous membrane  Neck:  Neck supple, trachea midline, no meningismus, no lymphadenopathy   Pulmonary:  Faint scattered wheeze  Cardiovascular:  Regular rate and rhythm with no murmurs, rubs, or gallops; 2+ bilateral upper extremity peripheral pulses  Abdomen:  Soft, non-distended, non-tender to palpation with no rebound or guarding  Musculoskeletal:  Atraumatic exam with no focal swelling or tenderness, no peripheral edema   Skin:  Warm and well perfused without rashes or lesions   Neuro:  Alert and oriented x 3, normal speech, moving all extremities without any focal deficits   Psych:  Appropriate mood and affect to the clinical situation     Diagnostic Studies   Labs:    Please see electronic medical record for any tests performed in the ED     Radiology:    Please see electronic medical record for any tests performed in the ED    EKG:  none    Emergency Department Procedures     Procedures    No ED procedure performed     Medical Decision Making     Elinore Shults is a 56 y.o. female with a history and presentation as described above in HPI.  The patient was evaluated by myself and the ED Attending Physician, Dr. Lewis Moccasin. All management and disposition plans were discussed and agreed upon. Nursing documentation reviewed.     Patient is a 56 YO that presents with shortness of breath following exposure to potential allergen.  Awake and alert, speaking in full sentences.  Reports that she needs racemic epi, solumedrol, pepcid, and benadryl.  These medications given and on reassessment asymptomatic and resting quietly.  Will be discharged to follow up PRN.     ED Course     Diagnosis: allergic reaction  On initial encounter the patient was in no acute distress with reassuring vitals and no signs of impending hemodynamic or respiratory collapse.    Physical exam was significant for normal speech.    Results and findings  were discussed in detail with the patient, who understood and agreed with the plan for discharge.    Prior to discharge the patient was reassessed and found to be in stable condition.    Return precautions were provided, and the patient left the ED with the understanding that they were welcome to return to the ED at any time.      Medications received during this ED visit:  Medications - No data to display    Impression     No diagnosis found.     Plan     At this time the patient has been deemed safe for discharge.  The patient is to be discharged home in stable/improved condition.  Workup, treatment and diagnosis were discussed with the patient and/or family members; the patient agrees to the plan and all questions were addressed and answered.  My customary discharge instructions including strict return precautions for worsening or new symptoms have been communicated.  The patient is agreeable to our plan.        Future Appointments  Date Time Provider Department Center   11/25/2016 2:45 PM Jonelle Sidle, MD  [facility] OP OP   01/04/2017 3:30 PM Asencion Noble, MD [facility]   01/27/2017 12:00 PM Biagio Borg, Ph.D. [facility]   02/17/2017 9:20 AM Christena Flake, DO [facility] ALG [facility]         Lenore Manner, MD, PGY-3  [facility]       Lenore Manner, MD  Resident  11/16/16 [id number]

## 2016-11-15 NOTE — Unmapped (Signed)
ED Attending Attestation Note    Date of service:  11/15/2016    This patient was seen by the resident physician.  I have seen and examined the patient, agree with the workup, evaluation, management and diagnosis. The care plan has been discussed and I concur.     My assessment reveals a 57 y.o. female presenting in respiratory distress but with clear lungs, no stridor, able to speak in full sentences.

## 2016-11-25 ENCOUNTER — Inpatient Hospital Stay
Admit: 2016-11-25 | Discharge: 2016-11-25 | Disposition: A | Discharge status: Home / Self Care | Payer: PRIVATE HEALTH INSURANCE

## 2016-11-25 ENCOUNTER — Emergency Department: Admit: 2016-11-25 | Payer: PRIVATE HEALTH INSURANCE

## 2016-11-25 ENCOUNTER — Ambulatory Visit: Admit: 2016-11-25 | Discharge: 2016-11-25 | Payer: PRIVATE HEALTH INSURANCE | Attending: Psychiatry

## 2016-11-25 DIAGNOSIS — S93402A Sprain of unspecified ligament of left ankle, initial encounter: Secondary | ICD-10-CM

## 2016-11-25 DIAGNOSIS — F419 Anxiety disorder, unspecified: Secondary | ICD-10-CM

## 2016-11-25 MED ORDER — hydrOXYzine pamoate (VISTARIL) 25 MG capsule
25 | ORAL_CAPSULE | ORAL | 3 refills | Status: AC
Start: 2016-11-25 — End: 2017-02-03

## 2016-11-25 MED ORDER — busPIRone (BUSPAR) 10 MG tablet
10 | ORAL_TABLET | Freq: Three times a day (TID) | ORAL | 3 refills | Status: AC
Start: 2016-11-25 — End: 2017-02-03

## 2016-11-25 NOTE — Unmapped (Signed)
ED Attending Attestation Note    This patient was seen by the advanced practice provider. I have seen and examined the patient, agree with the workup, evaluation, management and diagnosis.     Care plan has been discussed and I concur.     Assessment reveals a 56 y/o female who sustained a mechanical fall today.  Complaining of pain in her left ankle.  Reports hearing a pop.  Patient is neurovascular intact in the left foot.  X-ray negative for any fracture or dislocation.

## 2016-11-25 NOTE — Unmapped (Signed)
Please take your medication as prescribed and follow-up in 2 months.    If you develop any medication side effects or have any concerns about your mental health or your medication, please call [phone].    If feeling suicidal, call the suicide hotline at 513-281-CARE (2273), 911, or return to the emergency room.     SUICIDE PREVENTION RESOURCES:   [facility]:  513-281-CARE 8434153057) hotline      [facility]: [phone]      Mobile Crisis Team [phone]   [facility]:  [facility] Consultation & Crisis: [phone]   [facility]: Emergency Crisis Hotline: 513-528-SAVE 6052211514)   [place]: [facility] Emergency Crisis Line: 615-347-4746   National:  National Hotline: 1-800-273-TALK (8255)      Www.suicidepreventionlifeline.orgg  ------------------------------------------------------------------------------------------------------------------  Discharge Instructions After an Episode of Suicide Threats or Actions   Remove all fireams, weapons (of any kind) or any unneeded medicines that could be used   Take person to follow-up mental health appointments   Have a loved one sign a release of information allowing you to contact the mental health provider   Be direct and talk openly about suicidal thoughts   Be willing to listen. Allow expressions of feelings   Block all inappropriate internet websites and social media   Offer to be available to them at any time   Be available to accompany them to the nearest emergency department   Call other family members or friends to help and offer support       Get help from agencies that specialize in crisis intervention   Using the information here and other community resources, create a suicide safety plan. Example: call support networks, crisis lines, 911, go to emergency room, call Mobile Crisis Team, call mental health provider    Warning Signs:   Severe depression with restlessness   Inability to sleep or sleeping too much   Withdrawing  from friends, family and life   Increase alcohol and/or drug use   Giving away personal items   Anxiety, agitation, rage, or anger   Unpredictable mood swings   Feelings like there is no way out   Hopelessness   Constantly talking or writing about death   Seeking out information about suicide    What Family Members & Friends Should Know:   Suicidal ideas are usually associated with treatable conditions   People who try or actually commit suicide try to let someone know by leaving a note   All suicide threats should be taken seriously   Suicidal ideas occur when people are depressed, intoxicated or irrational   Suicidal thinking can consume a person   Suicide risk increases when a person who has been severely depressed suddenly has more energy   Most common ways of suicide - pills, guns, poisons, hanging, carbon monoxide breathing, jumping off high places, and accidents    How Can I Help When Someone is Threatening Suicide?   Take Action!    1. Take his/her words seriously and respond with compassion    2. Do not leave them alone    3. Call 911 and have person taken to emergency room    4. Accompany the person to the emergency room and provide the physician with information

## 2016-11-25 NOTE — Unmapped (Signed)
Please rest, ice, and elevate your left foot. Please call your primary care doctor tomorrow for follow-up appointment within a week.  Return for any worsening pain, numbness or weakness in the left foot, or any other concerning symptoms.

## 2016-11-25 NOTE — Unmapped (Signed)
[place] ED Note    Date of Service: 11/25/2016    Reason for Visit: Fall and Ankle Pain      Patient History     HPI:  This is a 56 y.o. female with PMH of Left ankle fracture presenting with left ankle pain.  She states she was walking from her doctor's appointment to the [facility] pharmacy and she slipped and fell.  She denies any syncope or loss of consciousness, stating it was a mechanical fall.  She states she feels like she rolled her ankle outwards and she heard a pop. She states she currently has a 3/10 pain that increases to a 5/10 with dorsiflexion.  She was unable to bear weight on the ankle after the fall.  She denies any loss of sensation or tingling.  She states she had plates and screws put in approximately 1.5 years ago for a fracture.  In July of this year she had the hardware removed due to an allergic reaction.  She denies any other musculoskeletal pains, chest pain, shortness of breath, syncope, head or neck trauma, abdominal pain, numbness or tingling, any other symptoms.    With the exception of the above, there are no aggravating or alleviating factors.    Past Medical History:   Diagnosis Date   ??? Anaphylaxis    ??? Anemia    ??? Anxiety    ??? Asthma    ??? Atrial fibrillation (CMS Dx)    ??? Bronchitis    ??? Heart disease    ??? Neurological disease    ??? OSA (obstructive sleep apnea)     states resolved   ??? Osteoporosis    ??? Spontaneous pneumothorax     states pneumomediatsium/pneumopericardium, self resolved   ??? Thyroid disease    ??? Ulcer    ??? WPW (Wolff-Parkinson-White syndrome)        Past Surgical History:   Procedure Laterality Date   ??? BONE MARROW BIOPSY      benign   ??? CARDIAC ELECTROPHYSIOLOGY MAPPING AND ABLATION      attempted x 2, states not successful either time   ??? CHOLECYSTECTOMY     ??? FACIAL COSMETIC SURGERY     ??? HYSTERECTOMY     ??? LIVER BIOPSY      benign hepatic adednoma per patient   ??? REMOVAL HARDWARE LEG Left 08/17/2016    Procedure: REMOVAL OF HARDWARE LEFT TIBIA;  Surgeon: Andee Lineman, MD;  Location: [facility] OR;  Service: Orthopedics;  Laterality: Left;   ??? TIBIA FRACTURE SURGERY     ??? TONSILLECTOMY      + adenoids       Emily Bonilla  reports that she has never smoked. She has never used smokeless tobacco. She reports that she does not drink alcohol or use drugs.    Discharge Medication List as of 11/25/2016  5:45 PM      CONTINUE these medications which have NOT CHANGED    Details   busPIRone (BUSPAR) 10 MG tablet Take 2 tablets (20 mg total) by mouth 3 times a day. Indications: Generalized Anxiety Disorder, Starting Thu 11/25/2016, Normal, Disp-180 tablet, R-3      cromolyn (GASTROCROM) 100 mg/5 mL solution Take 5 mLs (100 mg total) by mouth 4 times daily before meals and at bedtime., Starting Fri 11/05/2016, Normal, Disp-480 mL, R-11      doxepin (SINEQUAN) 10 MG capsule Take 1 capsule (10 mg total) by mouth at bedtime., Starting Thu 09/16/2016, Normal, Disp-90 capsule,  R-3      fexofenadine (ALLEGRA) 180 MG tablet Take 1 tablet (180 mg total) by mouth daily., Starting Thu 09/16/2016, Normal, Disp-30 tablet, R-3      furosemide (LASIX) 20 MG tablet Take 1 tablet (20 mg total) by mouth daily., Starting Mon 09/13/2016, Normal, Disp-180 tablet, R-1      !! hydrOXYzine HCl (ATARAX) 25 MG tablet Take 1 tablet (25 mg total) by mouth every 8 hours as needed for Anxiety. Indications: anxiety, Starting Thu 11/04/2016, Normal, Disp-20 tablet, R-3      !! hydrOXYzine HCl (ATARAX) 25 MG tablet Take 1 tablet (25 mg total) by mouth every 8 hours as needed for anxiety, Starting Thu 11/04/2016, Normal, Disp-20 tablet, R-3      hydrOXYzine pamoate (VISTARIL) 25 MG capsule Take one capsule (25 mg) by mouth three times daily. May take an additional capsule (two total capsules, 50 mg) as needed for severe anxiety Indications: anxiety, Print, Disp-100 capsule, R-3      levalbuterol (XOPENEX) 1.25 mg/3 mL nebulizer solution Inhale 3 mL (1.25 mg total) by nebulization every 6 hours as needed for Wheezing.,  Starting Thu 09/16/2016, Normal, Disp-72 mL, R-0      levothyroxine (SYNTHROID, LEVOTHROID) 112 MCG tablet Take one tablet by mouth once daily, Normal      montelukast (SINGULAIR) 10 mg tablet Take 1 tablet (10 mg total) by mouth at bedtime., Starting Thu 09/16/2016, Normal, Disp-90 tablet, R-1      nebulizer and compressor Devi Use 1 ampule as directed every 4 hours. Indications: Please dispense 1 nebulizer unit, Starting Thu 09/16/2016, Normal, Disp-1 each, R-0      omeprazole (PRILOSEC) 40 MG capsule Take 40 mg by mouth., Historical Med      onabotulinumtoxinA (BOTOX COSMETIC) 50 unit SolR Inject into the muscle., Historical Med      !! predniSONE (DELTASONE) 20 MG tablet TAKE ONE TABLET BY MOUTH DAILY., Normal, Disp-30 tablet, R-0      !! predniSONE (DELTASONE) 5 MG tablet Take 1 tablet (5 mg total) by mouth daily. Take 3 tablets (15 mg) for 5 days, then 2 tablets (10 mg), then 1 tablet (5 mg) and then stop., Starting Thu 11/04/2016, Normal, Disp-30 tablet, R-0      ranitidine (ZANTAC) 300 MG tablet Take 1 tablet (300 mg total) by mouth 3 times a day., Starting Thu 09/16/2016, Normal, Disp-90 tablet, R-11      triamcinolone (KENALOG) 0.1 % ointment Apply topically 2 times a day., Starting Thu 09/16/2016, Normal, Disp-30 g, R-3       !! - Potential duplicate medications found. Please discuss with provider.          Allergies:   Allergies as of 11/25/2016 - Fully Reviewed 11/25/2016   Allergen Reaction Noted   ??? Acetaminophen Anaphylaxis 11/17/2015   ??? Aspirin Anaphylaxis 09/27/2015   ??? Blue dye Anaphylaxis 11/17/2015   ??? Epinephrine Anaphylaxis 11/25/2015   ??? Flecainide Shortness Of Breath 02/06/2016   ??? Gabapentin Anaphylaxis 11/17/2015   ??? Iodinated contrast- oral and iv dye Anaphylaxis 11/07/2015   ??? Isopropyl alcohol Anaphylaxis 11/07/2015   ??? Latex Anaphylaxis and Rash 09/27/2015   ??? Lidocaine Anaphylaxis 09/27/2015   ??? Nsaids (non-steroidal anti-inflammatory drug) Anaphylaxis 11/07/2015   ??? Onion Anaphylaxis  11/17/2015   ??? Other Anaphylaxis 09/27/2015   ??? Perfume Anaphylaxis 11/07/2015   ??? Pregabalin Anaphylaxis 09/27/2015   ??? Quetiapine Anaphylaxis 11/07/2015   ??? Red dye Anaphylaxis 11/07/2015   ??? Sulfur Anaphylaxis 11/25/2015   ??? Yellow  dye Anaphylaxis 11/07/2015   ??? Doxepin Other (See Comments) 11/17/2015   ??? Duloxetine  11/17/2015   ??? Epi e-z pen  01/19/1996   ??? Hydroxyzine Itching 11/07/2015   ??? Levofloxacin Itching 11/07/2015   ??? Montelukast  11/07/2015   ??? Verapamil (bulk) Hives 02/06/2016   ??? Adhesive tape-silicones Itching, Rash, and Swelling 09/27/2015   ??? Trazodone Rash 11/07/2015       PMH: Nursing notes reviewed   PSH: Nursing notes reviewed   FH: Nursing notes reviewed   MEDS: Nursing notes and chart reviewed         Review of Systems     ROS:    Review of Systems   Constitutional: Negative for chills and fever.   Eyes: Negative for blurred vision and photophobia.   Respiratory: Negative for cough and shortness of breath.    Cardiovascular: Negative for chest pain, palpitations and leg swelling.   Gastrointestinal: Negative for abdominal pain, diarrhea, nausea and vomiting.   Genitourinary: Negative for dysuria.   Musculoskeletal: Positive for joint pain (left ankle). Negative for back pain, myalgias and neck pain.   Neurological: Negative for dizziness, tingling and headaches.   Psychiatric/Behavioral: Negative for suicidal ideas.       Physical Exam     Vitals:    11/25/16 1531   BP: (!) 132/92   BP Location: Right arm   Patient Position: Sitting   Pulse: 96   Resp: 18   Temp: 97.9 ??F (36.6 ??C)   TempSrc: Oral   SpO2: 98%       General:  well nourished; well developed; in no apparent distress     HEENT:  normocephalic, atraumatic    Neck:  Supple; trachea midline     Pulmonary:   no respiratory distress    Cardiac:  Chest symmetrical    Abdomen:  non-distended    Musculoskeletal:  no peripheral edema; No deformity noted; tenderness to palpation on the posterior portion of the lateral malleolus and over  the talar bone; patient is able to plantar flex and dorsiflex left ankle against resistance.  She states that dorsiflexion increases the pain.  She is able to wiggle her toes and has full range of motion of her left knee.    Vascular:  2+ peripheral pulses in bilateral lower extremities     Skin:  warm and well perfused without rashes or lesions     Neuro:  alert and oriented x 3; normal gait; strength 5/5 in lower extremities bilaterally; sensation intact in lower extremities bilaterally    Psych:  appropriate mood and affect         Diagnostic Studies     Labs: The attending and I have reviewed laboratory findings.  Labs were reviewed and and significant values identified.    Please see electronic medical record for any tests performed in the ED     Labs Reviewed - No data to display    IMAGING STUDIES / RADIOLOGY: The attending and I have reviewed radiographic imaging.  Imaging studies were reviewed and and significant values identified.    Please see electronic medical record for any tests performed in the ED    X-ray Ankle Left min 3-views   Final Result   IMPRESSION:   Mild medial soft tissue swelling, without acute fracture or dislocation.      Postoperative and posttraumatic findings of the distal tibia as above.      Report Verified by: Letitia Caul at 11/25/2016 5:25 PM EST  ED Course and MDM     Vital signs, medical history, social history, allergies and nursing notes reviewed.    Vitals:  BP (!) 132/92 (BP Location: Right arm, Patient Position: Sitting)    Pulse 96    Temp 97.9 ??F (36.6 ??C) (Oral)    Resp 18    LMP  (LMP Unknown)    SpO2 98%     Briefly this is a is a 56 y.o. female who presents to the emergency department with Left ankle pain after fall.  Vital signs were stable and remained stable throughout her stay.  Physical exam was performed and is detailed above.    X-ray of left ankle was performed due to history of fracture and tenderness to posterior portion of lateral malleolus.  It  came back showing no acute fracture or dislocation.  She is allergic to Tylenol and ibuprofen, therefore she was not prescribed any pain medication.  She was given crutches and told to follow-up with her primary care provider within the week.  The patient was agreeable to the plan at this time and is stable for discharge.    The patient was seen and evaluated by the attending physician Dr. Richrd Sox who agreed with the assessment and plan.  The patient and / or the family were informed of the results of any tests, a time was given to answer questions, a plan was proposed and they agreed with plan.     The patient was given pertinent discharge instructions related to their diagnosis, reasons to return to the emergency department as well as follow up instructions.     Risks, benefits, and alternatives were discussed. At this time the patient has been deemed safe for discharge. My customary discharge instructions including strict return precautions for worsening or new symptoms have been communicated.  Please see AVS for further details.      Dispo:    Discharge    Medications administered in the ED:    Medications - No data to display        Impression     1. Sprain of left ankle, unspecified ligament, initial encounter                    Dyane Dustman, [place]  11/25/16 1806

## 2016-11-25 NOTE — Unmapped (Signed)
Pt able to walk using crutches.

## 2016-11-25 NOTE — Unmapped (Signed)
Visit Start Time:  2:10 PM  Visit Quit Time:  2:50 PM    Chief Complaint   Patient presents with   ??? Anxiety     Context:??allergic reactions, preoccupation with somatic symptoms  Location: Altered mental status of anxiety  Duration: chronic  Severity:??mild-moderate.  Associated Symptoms:??irritability, anxiety  Modifying Factors: optimization of medications, not currently in therapy      History of Present Illness:   Emily Bonilla is a 56 y.o. female with PMH anxiety, atrial fibrillation, WPW, idiopathic mast cell activation, anemia, asthma, hypothyroidism, GERD, intermittent chronic HA, [facility].     Last seen [facility] 09/23/16. At that time the patient reported improvement in her anxiety from higher dose of Buspirone but continued to perseverate about her medical symptoms. She had a planned admission to the EMU 10/28/16-10/31/16 and had numerous episodes of anxiety and transient somatic complaints but had no abnormal EEG correlates, no spells during her admission, cvEEG was normal. Was diagnosed with non-epileptic spells. The patient subsequently presented to [facility] 10/31/16 with severe anxiety, was given 1 dose of Clonazepam and referred to [facility] where she presented voluntarily and was prescribed Hydroxyzine 25 mg TID PRN. She also presented to [facility] 11/15/16 with concern for an allergic reaction in response to an alcohol swab and was discharged home.    Current psychiatric medications: Buspirone 20 mg TID, Hydroxyzine 25 mg TID PRN, Doxepin 10 mg QHS (Doxepin prescribed by her allergist)    HPI       The patient reports improvement in her anxiety over the past few weeks since she has been taking Hydroxyzine. Rates it 4-5/10 in severity, as compared to previously 9-10/10 severity. Says she has been feeling more calm. Also regaining my filter which she says gets lost from high levels of anxiety. Denies any recent panic attacks. Has been taking Hydroxyzine 25 mg  scheduled and once took 50 mg when she was very anxious after an allergic reaction. Started doing massage therapy and will start doing hypnotherapy starting in the middle of January. Has home health aide 4 hours per day and has looked into getting an assisted living apartment but says that once she is on the wait list it would take 2-5 years to get an apartment. Now has an emergency GPS button she can push if she starts to feel like she is having an anaphylactic reaction which is comforting. Continues to be very focused on her medical symptoms. Had an anaphylactic reaction 10 days ago. Says she is having a hard time getting IgG approved by her insurance and that her anxiety is improved when her IgG levels are higher. Says that she has not had any seizure activity over the past few weeks. Denies feeling sad or down. Good energy, eating well, sleeping well. Denies SI/HI. Denies any medication SEs. Thinks both Buspirone and Hydroxyzine have been helpful. Will be going to her brother's in [place] for Thanksgiving and Christmas. Has decreased her use of Diphenhydramine - now only taking 75 mg daily as compared to previously when she was taking 250 mg daily. Has been tapering Prednisone and tomorrow will be her last dose. Agreeable to continuing current medication regimen, with the future possibility to decrease Hydroxyzine from scheduled to PRN usage once she is further engaged in integrative medicine.    Patient History:     Psychiatric History:     Prior Inpatient Psychiatric Hospitalization(s): denies  Prior Psychotropic Medications: reports taking prior psychotropic medications.  Current Psychotropic Medications: yes - see below current psychotropic medications.  SA: denies    Past Medical History:   Diagnosis Date   ??? Anaphylaxis    ??? Anemia    ??? Anxiety    ??? Asthma    ??? Atrial fibrillation (CMS Dx)    ??? Bronchitis    ??? Heart disease    ??? Neurological disease    ??? OSA (obstructive sleep apnea)     states resolved   ???  Osteoporosis    ??? Spontaneous pneumothorax     states pneumomediatsium/pneumopericardium, self resolved   ??? Thyroid disease    ??? Ulcer    ??? WPW (Wolff-Parkinson-White syndrome)        Past Surgical History:   Procedure Laterality Date   ??? BONE MARROW BIOPSY      benign   ??? CARDIAC ELECTROPHYSIOLOGY MAPPING AND ABLATION      attempted x 2, states not successful either time   ??? CHOLECYSTECTOMY     ??? FACIAL COSMETIC SURGERY     ??? HYSTERECTOMY     ??? LIVER BIOPSY      benign hepatic adednoma per patient   ??? REMOVAL HARDWARE LEG Left 08/17/2016    Procedure: REMOVAL OF HARDWARE LEFT TIBIA;  Surgeon: Andee Lineman, MD;  Location: [facility] OR;  Service: Orthopedics;  Laterality: Left;   ??? TIBIA FRACTURE SURGERY     ??? TONSILLECTOMY      + adenoids       Social History     Social History   ??? Marital status: Divorced     Spouse name: N/A   ??? Number of children: N/A   ??? Years of education: N/A     Social History Main Topics   ??? Smoking status: Never Smoker   ??? Smokeless tobacco: Never Used      Comment: Denies (11/15/2016)   ??? Alcohol use No      Comment: Denies (11/15/2016)   ??? Drug use: No      Comment: Denies (11/15/2016)   ??? Sexual activity: Not Currently     Other Topics Concern   ??? Caffeine Use Yes   ??? Exercise No   ??? Seat Belt Yes     Social History Narrative   ??? None       Family History   Problem Relation Age of Onset   ??? Kidney failure Mother    ??? Aneurysm Mother        Review of Systems  +weight gain, attributes to taking Prednisone  Denies CP, SOB, abdominal pain, nausea, vomiting, extremity pain    Allergies:   Acetaminophen; Aspirin; Blue dye; Epinephrine; Flecainide; Gabapentin; Iodinated contrast- oral and iv dye; Isopropyl alcohol; Latex; Lidocaine; Nsaids (non-steroidal anti-inflammatory drug); Onion; Other; Perfume; Pregabalin; Quetiapine; Red dye; Sulfur; Yellow dye; Doxepin; Duloxetine; Epi e-z pen; Hydroxyzine; Levofloxacin; Montelukast; Verapamil (bulk); Adhesive tape-silicones; and  Trazodone    Medications:     Outpatient Encounter Prescriptions as of 11/25/2016   Medication Sig Dispense Refill   ??? busPIRone (BUSPAR) 10 MG tablet Take 2 tablets (20 mg total) by mouth 3 times a day. Indications: Generalized Anxiety Disorder 180 tablet 3   ??? cromolyn (GASTROCROM) 100 mg/5 mL solution Take 5 mLs (100 mg total) by mouth 4 times daily before meals and at bedtime. 480 mL 11   ??? doxepin (SINEQUAN) 10 MG capsule Take 1 capsule (10 mg total) by mouth at bedtime. 90 capsule 3   ??? fexofenadine (ALLEGRA) 180  MG tablet Take 1 tablet (180 mg total) by mouth daily. 30 tablet 3   ??? furosemide (LASIX) 20 MG tablet Take 1 tablet (20 mg total) by mouth daily. 180 tablet 1   ??? hydrOXYzine HCl (ATARAX) 25 MG tablet Take 1 tablet (25 mg total) by mouth every 8 hours as needed for Anxiety. Indications: anxiety 20 tablet 3   ??? hydrOXYzine HCl (ATARAX) 25 MG tablet Take 1 tablet (25 mg total) by mouth every 8 hours as needed for anxiety 20 tablet 3   ??? hydrOXYzine pamoate (VISTARIL) 25 MG capsule Take one capsule (25 mg) by mouth three times daily. May take an additional capsule (two total capsules, 50 mg) as needed for severe anxiety Indications: anxiety 100 capsule 3   ??? levalbuterol (XOPENEX) 1.25 mg/3 mL nebulizer solution Inhale 3 mL (1.25 mg total) by nebulization every 6 hours as needed for Wheezing. 72 mL 0   ??? levothyroxine (SYNTHROID, LEVOTHROID) 112 MCG tablet Take one tablet by mouth once daily 90 tablet 3   ??? montelukast (SINGULAIR) 10 mg tablet Take 1 tablet (10 mg total) by mouth at bedtime. 90 tablet 1   ??? nebulizer and compressor Devi Use 1 ampule as directed every 4 hours. Indications: Please dispense 1 nebulizer unit 1 each 0   ??? omeprazole (PRILOSEC) 40 MG capsule Take 40 mg by mouth.     ??? onabotulinumtoxinA (BOTOX COSMETIC) 50 unit SolR Inject into the muscle.     ??? predniSONE (DELTASONE) 20 MG tablet TAKE ONE TABLET BY MOUTH DAILY. 30 tablet 0   ??? predniSONE (DELTASONE) 5 MG tablet Take 1 tablet  (5 mg total) by mouth daily. Take 3 tablets (15 mg) for 5 days, then 2 tablets (10 mg), then 1 tablet (5 mg) and then stop. 30 tablet 0   ??? ranitidine (ZANTAC) 300 MG tablet Take 1 tablet (300 mg total) by mouth 3 times a day. 90 tablet 11   ??? triamcinolone (KENALOG) 0.1 % ointment Apply topically 2 times a day. 30 g 3   ??? [DISCONTINUED] busPIRone (BUSPAR) 10 MG tablet Take 2 tablets (20 mg total) by mouth 3 times a day. Indications: Generalized Anxiety Disorder 180 tablet 3     No facility-administered encounter medications on file as of 11/25/2016.         Outpatient Medications Prior to Visit   Medication Sig Dispense Refill   ??? cromolyn (GASTROCROM) 100 mg/5 mL solution Take 5 mLs (100 mg total) by mouth 4 times daily before meals and at bedtime. 480 mL 11   ??? doxepin (SINEQUAN) 10 MG capsule Take 1 capsule (10 mg total) by mouth at bedtime. 90 capsule 3   ??? fexofenadine (ALLEGRA) 180 MG tablet Take 1 tablet (180 mg total) by mouth daily. 30 tablet 3   ??? furosemide (LASIX) 20 MG tablet Take 1 tablet (20 mg total) by mouth daily. 180 tablet 1   ??? hydrOXYzine HCl (ATARAX) 25 MG tablet Take 1 tablet (25 mg total) by mouth every 8 hours as needed for Anxiety. Indications: anxiety 20 tablet 3   ??? hydrOXYzine HCl (ATARAX) 25 MG tablet Take 1 tablet (25 mg total) by mouth every 8 hours as needed for anxiety 20 tablet 3   ??? levalbuterol (XOPENEX) 1.25 mg/3 mL nebulizer solution Inhale 3 mL (1.25 mg total) by nebulization every 6 hours as needed for Wheezing. 72 mL 0   ??? levothyroxine (SYNTHROID, LEVOTHROID) 112 MCG tablet Take one tablet by mouth once daily 90  tablet 3   ??? montelukast (SINGULAIR) 10 mg tablet Take 1 tablet (10 mg total) by mouth at bedtime. 90 tablet 1   ??? nebulizer and compressor Devi Use 1 ampule as directed every 4 hours. Indications: Please dispense 1 nebulizer unit 1 each 0   ??? omeprazole (PRILOSEC) 40 MG capsule Take 40 mg by mouth.     ??? onabotulinumtoxinA (BOTOX COSMETIC) 50 unit SolR Inject into  the muscle.     ??? predniSONE (DELTASONE) 20 MG tablet TAKE ONE TABLET BY MOUTH DAILY. 30 tablet 0   ??? predniSONE (DELTASONE) 5 MG tablet Take 1 tablet (5 mg total) by mouth daily. Take 3 tablets (15 mg) for 5 days, then 2 tablets (10 mg), then 1 tablet (5 mg) and then stop. 30 tablet 0   ??? ranitidine (ZANTAC) 300 MG tablet Take 1 tablet (300 mg total) by mouth 3 times a day. 90 tablet 11   ??? triamcinolone (KENALOG) 0.1 % ointment Apply topically 2 times a day. 30 g 3   ??? busPIRone (BUSPAR) 10 MG tablet Take 2 tablets (20 mg total) by mouth 3 times a day. Indications: Generalized Anxiety Disorder 180 tablet 3     No facility-administered medications prior to visit.        Objective:   Physical Exam    Gait and Stations: Normal    Mental Status Exam:   Appearance: middle-aged Caucasian female, appears stated age, good hygiene and grooming, fair eye contact, cooperative  Activity: no PMR or PMA, no abnormal movements noted  Speech: hyper-verbal and mildly pressured but easily able to be interrupted, at times loud, normal latency of response, fluent with no aphasia  Mood/Affect: better affect congruent with stated mood, restricted range, anxious but to lessened extent as compared to previously  Thought Process: organized, linear, goal-directed, no LOA or FOI  Thought Content: talks about improvement in her anxiety, very focused on her health symptoms, denies SI/HI  Perceptions: does not appear to be internally preoccupied or RTIS  Attention:fair to conversation  Fund of Knowledge:average  Memory:appropriate to conversation  Insight/Judgement: fair/fair    PHQ-9 Scores:   PHQ Total Score 03/30/2016 05/21/2016 06/24/2016 08/26/2016   PHQ-9 Total Score 0 0 0 9       GAD7 Scores:   GAD7Total Score 08/26/2016 11/25/2016   Total Score 10 6   08/26/16: very difficult  11/25/16: somewhat difficult       Assessment/Plan:     Emily Bonilla??is a 56 y.o. female with PMH anxiety, atrial fibrillation, WPW, idiopathic mast cell  activation, anemia, asthma, hypothyroidism, GERD, intermittent chronic HA, [facility].  ??  The patient's anxiety has improved on the current medication regimen of Buspirone and Hydroxyzine (prescribed by [facility] physician), and she denies any SEs so will continue current medication regimen, particularly given her previous sensitivity to other medications. She has also significantly cut back on her use of Diphenhydramine so it is safe for her to continue lower daily dose of Hydroxyzine and she is not having any SEs. She continues to be very preoccupied with her medical symptoms and has notable illness anxiety which appears to be an exaggerated response to objective data of her medical diagnoses (awaiting records to determine if she has a formal diagnosis of mast cell activation syndrome, previous allergists concluded that she did not have mastocytosis, EMU admission demonstrated negative cvEEG). 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 Will continue regimen as below.  ??  DSM-5 Diagnoses: Unspecified anxiety disorder. R/O Adjustment disorder with anxiety. Cluster B traits (particularly histrionic). R/O Somatic symptom disorder. R/O Illness anxiety disorder. R/O Factitious disorder  Medical Diagnoses: Atrial fibrillation, WPW, idiopathic mast cell activation, anemia, asthma, hypothyroidism, GERD, intermittent chronic HA  GAF: 60  ??  # Unspecified anxiety disorder. R/O Illness anxiety disorder. R/O Somatic symptom disorder:  -Continue Buspirone 20 mg TID for anxiety  -Continue Hydroxyzine 25 mg TID for anxiety and can take an additional 25 mg daily PRN for severe anxiety (will prescribe 100 capsules per month).   -Reached out to Dr. Nicholes Stairs regarding uncertain diagnosis of mast cell activation syndrome - awaiting records from [place] and [place] for clarification  -Follow-up with integrative medicine for massage therapy, acupuncture   -RTC  in 8 weeks  ??  Discussed risks/benefits/side effects/alternatives to medications.??Patient expressed understanding of and agreement to the current treatment plan.   ??  # Safety: The patient's imminent risk appears low, as she denies SI/HI, intent or plan, no previous SA, future-oriented and goal-oriented to continue treatment. Patient expressed understanding of resources, including 911 and PES.  ??  # Substance:  -No active issues  ??  # Medical:  -Follow-up with PCP  ??  Jonelle Sidle, MD  Psychiatry PGY-3  Pager: [phone]  ??  Patient seen, plan discussed and agreed upon by attending Dr. Thomasena Edis

## 2016-11-26 NOTE — Unmapped (Signed)
Patient states she needs the Hydroxyzine script sent to [facility].  She fell & went to [facility] ER yesterday & isn't able to take her script to the pharmacy.

## 2016-11-29 NOTE — Telephone Encounter (Signed)
Called script into [place].

## 2016-12-16 NOTE — Unmapped (Signed)
Pt was informed to contact her Neurologist regarding tinnitus via MyChart     Thank you

## 2016-12-29 NOTE — Unmapped (Signed)
Patient called  Phone [phone] (home)     Telephone Information:   Mobile [phone]         Fever of 102.90F @ 4pm  today  How long have you had a fever? Started today  Do you have any respiratory symptoms such as cough, nasal congestion, sore throat, ear pain? Nasal congestation , green phlegm, sore throat  Do you have any abdominal pain? no      Have you tried anything for your fever?  No, states she is allergic to tylenol, motrin, can not take any medications.  If so, does it bring your temperature down? 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 Has prn prednisone and will start taking taking 20mg  of prednisone tonight.   Wold like to see pcp tomorrow.       Future Appointments  Date Time Provider Department Center   12/30/2016 10:30 AM Court Joy, MD [facility] HOX HOX   01/04/2017 3:30 PM Asencion Noble, MD [facility] NEUR HOX HOX   01/27/2017 12:00 PM Biagio Borg, Ph.D. [facility] MID MID   02/03/2017 2:45 PM Jonelle Sidle, MD [facility] PSY OP OP   02/17/2017 9:20 AM Matilde Sprang V, DO [facility] ALG HOX HOX               Forwarding to MD for review and further instructions or advice.     [employer] (2012). Telephone Triage Protocols for Nurses. [place], [place]. Audrea Muscat- Fourth Edition.

## 2016-12-30 ENCOUNTER — Ambulatory Visit: Admit: 2016-12-30 | Payer: PRIVATE HEALTH INSURANCE

## 2016-12-30 DIAGNOSIS — J019 Acute sinusitis, unspecified: Secondary | ICD-10-CM

## 2016-12-30 NOTE — Unmapped (Signed)
It was a pleasure seeing you today and we hope you have a happy and safe holiday season!      Please try to keep the following in mind for a health holiday.     - Avoid overeating on multiple days during the holidays (remember it's a Holiday not aHoliweek) - avoiding any overeating would be ideal, but sometimes it's tough to avoid a single big feast with family and friends.  - Drink water when thirsty (no calories from liquid food sources) - high calorie drinks such as juice and sugar drinks add calories without filling you up!    - Eat at least 8 servings of fresh fruits and vegetables (except bananas! They have lots of sugar).    - Walk at least 30 min a day.    Happy Holidays!

## 2016-12-30 NOTE — Unmapped (Signed)
[facility] Internal Medicine  Faculty Practice Acute Visit   Subjective:   Pre-visit planning done, including pre-visit huddle with nursing and MA staff prior to session.     Patient ID: Emily Bonilla is a 56 y.o. lady with a history of WPW, syncope, anxiety and depression, Mast cell activation syndrome, here for an acute visit for fever.     HPIhad sinusitis for the past 2 weeks then it dropped into my chest.  Has had cough for the past 4-5 days.  Cough productive of sputum.  She also had a fever of 101.7 on Monday, 102 on Wednesday.  I usually have an antibiotic at home, but I didn't have anything.  No sick contact.  +myalgias, generalized malaise.  I feel better today than I did yesterday, but the fever goes up and down.      She does have numerous Abx allergies, but has tolerated azithromycin well in the past.  Patient states she is allergic to tylenol and ibuprofen and was unable to take any medications.     The following portions of the patient's history were reviewed and updated as appropriate: allergies, current medications, past family history, past medical history, past social history, past surgical history and problem list.    Review of Systems   as above, otherwise negative.   Objective:   Physical Exam   Nursing note and vitals reviewed.  Constitutional: She is oriented to person, place, and time.   Pleasant lady, sitting in chair, coughing, congested.   HENT:   Head: Normocephalic and atraumatic.   Mouth/Throat: Oropharynx is clear and moist. No oropharyngeal exudate.   Nares inflamed, with small ulcerations.    Eyes: Conjunctivae are normal. Right eye exhibits no discharge. Left eye exhibits no discharge. No scleral icterus.   Neck: Normal range of motion. Neck supple. No thyromegaly present.   Cardiovascular: Normal rate, regular rhythm and normal heart sounds.    Pulmonary/Chest: Effort normal and breath sounds normal.   Diffuse exp wheezes, but moving air well. Normal respiratory effort    Musculoskeletal: Normal range of motion.   Lymphadenopathy:     She has no cervical adenopathy.   Neurological: She is alert and oriented to person, place, and time. No cranial nerve deficit. Coordination normal.   Skin: Skin is warm and dry.   Psychiatric: She has a normal mood and affect. Her behavior is normal. Judgment and thought content normal.     Assessment & Plan:   Viral URI with cough: Assessed as new problem.   Discussed pathophysiology and normal disease course with patient.    - continue with supportive care  - call/RTC if symptoms persist or worsen.   - will refer to ENT given nasal ulcers    Hyperglycemia: noted on exams. May be due to chronic steroid use  - will check A1c  - will order DXA (given chronic steroid use).     Patient understands his/her medication(s). Proper use, potential side effects of these medications,  as well as barriers to using these medications were reviewed and addressed, as were over-the-counter medications and supplements during encounter. Response and side effects to current medications also reviewed.          Court Joy, MD, FACP  Assistant Professor of Clinical Medicine  [facility]  [place] [facility]  Department of Internal Medicine  12/30/2016   11:01 AM

## 2017-01-04 ENCOUNTER — Ambulatory Visit: Admit: 2017-01-04 | Payer: PRIVATE HEALTH INSURANCE | Attending: Neurology

## 2017-01-04 DIAGNOSIS — F445 Conversion disorder with seizures or convulsions: Secondary | ICD-10-CM

## 2017-01-04 NOTE — Unmapped (Signed)
[facility]  Neurology Department      Subjective:   Chief Complaint: Seizures     History of Present Illness:  Emily Bonilla is a 56 y.o. 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    Last seen by me as NPV 09/28/16. She has since been evaluated with 72-hour EEG and EMU admission. She was diagnosed with PNES. She seems receptive to this diagnosis and has thought about whether stress was indeed affecting her. Overall her anxiety is improved. She has also established care with allergy. She will see integrative and psychiatry.     Today, she describes an episode over Thanksgiving of blurry vision, tinnitus, and a sensation as if her she was sitting on a generator.     She is not driving.     She reports prior botox for migraine headaches. She has 10 headache free days a month. +Photo/phonophobia. 20 days of migraine headache. +Nausea. Throbbing quality. Severity right-sided. +Blurry/double vision. She has failed prior medications including VPA, amitriptyline. She has numerous drug allergies. She has tolerated botox in the past.     Past Medical History:     Past Medical History:   Diagnosis Date   ??? Anaphylaxis    ??? Anemia    ??? Anxiety    ??? Asthma    ??? Atrial fibrillation (CMS Dx)    ??? Bronchitis    ??? Heart disease    ??? Neurological disease    ??? OSA (obstructive sleep apnea)     states resolved   ??? Osteoporosis    ??? Spontaneous pneumothorax     states pneumomediatsium/pneumopericardium, self resolved   ??? Thyroid disease    ??? Ulcer    ??? WPW (Wolff-Parkinson-White syndrome)      Past Surgical History:   Procedure Laterality Date   ??? BONE MARROW BIOPSY      benign   ??? CARDIAC ELECTROPHYSIOLOGY MAPPING AND ABLATION      attempted x 2, states not successful either time   ??? CHOLECYSTECTOMY     ??? FACIAL COSMETIC SURGERY     ??? HYSTERECTOMY     ??? LIVER BIOPSY      benign hepatic adednoma per patient   ??? REMOVAL HARDWARE LEG Left 08/17/2016     Procedure: REMOVAL OF HARDWARE LEFT TIBIA;  Surgeon: Andee Lineman, MD;  Location: [facility] OR;  Service: Orthopedics;  Laterality: Left;   ??? TIBIA FRACTURE SURGERY     ??? TONSILLECTOMY      + adenoids       Social History:     Social History     Social History   ??? Marital status: Divorced     Spouse name: N/A   ??? Number of children: N/A   ??? Years of education: N/A     Occupational History   ??? Not on file.     Social History Main Topics   ??? Smoking status: Never Smoker   ??? Smokeless tobacco: Never Used      Comment: Denies (11/15/2016)   ??? Alcohol use No      Comment: Denies (11/15/2016)   ??? Drug use: No      Comment: Denies (11/15/2016)   ??? Sexual activity: Not Currently     Other Topics Concern   ??? Caffeine Use Yes   ??? Exercise No   ??? Seat Belt Yes     Social History Narrative   ??? No  narrative on file       Family History:     Family History   Problem Relation Age of Onset   ??? Kidney failure Mother    ??? Aneurysm Mother        Review of System:     Answers for HPI/ROS submitted by the patient on 01/03/2017   Activity Change: Yes  Appetite change: No  Chills: Yes  Diaphoresis (Excessive Sweating): Yes  Fatigue: Yes  Fever: Yes  Unexpected Weight Change: Yes  Facial Swelling: Yes  Neck Pain: Yes  Neck Stiffness: No  Ear Discharge: No  Hearing Loss: No  Ear Pain: Yes  Tinnitus (ringing in ears): Yes  Nosebleeds: No  Congestion: Yes  Rhinorrhea (Excessive discharge from the nose): Yes  Postnasal Drip: Yes  Sneezing: No  Sinus Pressure: Yes  Dental Problems: Yes  Drooling: No  Mouth Sores: Yes  Sore Throat: No  Trouble Swallowing: Yes  Voice Change: Yes  Eye Discharge: No  Eye Itching: Yes  Eye Pain: No  Eye Redness: Yes  Photophobia (Sensitivity to Light): Yes  Visual Disturbance: Yes  Apnea: No  Chest Tightness: Yes  Choking: Yes  Cough: Yes  Shortness of Breath: Yes  Stridor (High pitched wheezing): No  Wheezing: No  Chest Pain: No  Leg Swelling: No  Palpitations (Abnormal heartbeat): No  Abominal distention (Abdominal  Swelling): Yes  Abdominal Pain: No  Anal Bleeding: No  Blood in Stool: No  Constipation: Yes  Diarrhea: Yes  Nausea: No  Rectal Pain: No  Vomiting: No  Cold Intolerance: Yes  Heat Intolerance: Yes  Polydipsia (excessive thirst): No  Polyphagia (increased appetite): No  Polyuria (frequent urination): No  Difficulty urinating: No  Dysuria (Pain with Urination): No  Enuresis (Urinary incontinence): No  Flank pain (Pain on one side between upper abdomen and back): No  Frequency (urine): No  Genital sore: No  Hematuria (blood in urine): No  Urgency (urine): No  Urine decreased: Yes  Arthralgias (Joint pain): Yes  Back pain: Yes  Gait problem (Problems walking): Yes  Joint swelling: Yes  Color change: Yes  Pallor (Pale skin): No  Rash : Yes  Wound: No  Environmental allergies: Yes  Food Allergies : Yes  Immunocompromised: Yes  Dizziness: Yes  Facial asymmetry: No  Headaches : Yes  Light-headedness: Yes  Numbness: Yes  Seizures: Yes  Speech difficulty: No  Syncope (fainting): Yes  Tremors: Yes  Weakness: Yes  Adenopathy (swollen lymph): No  Bruises/bleeds easily: Yes  Agitation: Yes  Behavior problem: No  Confusion: No  Decreased concentration: Yes  Dysphoric mood (unpleasant mood): No  Hallucinations: No  Hyperactive: No  Nervous/anxious: Yes  Self-injury: No  Sleep disturbance: No  Suicidal ideas: No    Medications:     Allergies   Allergen Reactions   ??? Acetaminophen Anaphylaxis     Unknown   ??? Aspirin Anaphylaxis     Unknown   ??? Blue Dye Anaphylaxis     Unknown   ??? Epinephrine Anaphylaxis   ??? Flecainide Shortness Of Breath     Itching, decreased libido, wheezing, flushing, numbness.    ??? Gabapentin Anaphylaxis   ??? Iodinated Contrast- Oral And Iv Dye Anaphylaxis   ??? Isopropyl Alcohol Anaphylaxis   ??? Latex Anaphylaxis and Rash   ??? Lidocaine Anaphylaxis   ??? Nsaids (Non-Steroidal Anti-Inflammatory Drug) Anaphylaxis   ??? Onion Anaphylaxis   ??? Other Anaphylaxis     HAS MAST CELL ACTIVATION SYNDROME, SO PATIENT HAS  ANAPHYLACTIC REACTIONS IF BODY RECOGNIZES TRIGGERS  CANNOT TOLERATE ANY DYES, PRESERVATIVES, PARABENS, ETC.  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   ??? Perfume Anaphylaxis   ??? Pregabalin Anaphylaxis   ??? Quetiapine Anaphylaxis     Mental Status Change   ??? Red Dye Anaphylaxis     Unknown   ??? Sulfur Anaphylaxis     Pt states: Any alcohol product   ??? Yellow Dye Anaphylaxis     Unknown   ??? Doxepin Other (See Comments)     Unknown   ??? Duloxetine      Other reaction(s): Other (See Comments)  Mental Status Change   ??? Epi E-Z Pen    ??? Hydroxyzine Itching     Unknown   ??? Levofloxacin Itching   ??? Montelukast    ??? Verapamil (Bulk) Hives     Insomnia, flushing, scattered brain, migraine, itching, anxiety.    ??? Adhesive Tape-Silicones Itching, Rash and Swelling     Unknown   ??? Trazodone Rash     Current Outpatient Prescriptions on File Prior to Visit   Medication Sig Dispense Refill   ??? busPIRone (BUSPAR) 10 MG tablet Take 2 tablets (20 mg total) by mouth 3 times a day. Indications: Generalized Anxiety Disorder 180 tablet 3   ??? cromolyn (GASTROCROM) 100 mg/5 mL solution Take 5 mLs (100 mg total) by mouth 4 times daily before meals and at bedtime. 480 mL 11   ??? diphenhydrAMINE (BENADRYL) 25 mg capsule Take 75-150 mg by mouth daily.     ??? doxepin (SINEQUAN) 10 MG capsule Take 1 capsule (10 mg total) by mouth at bedtime. 90 capsule 3   ??? fexofenadine (ALLEGRA) 180 MG tablet Take 1 tablet (180 mg total) by mouth daily. 30 tablet 3   ??? furosemide (LASIX) 20 MG tablet Take 1 tablet (20 mg total) by mouth daily. 180 tablet 1   ??? hydrOXYzine HCl (ATARAX) 25 MG tablet Take 1 tablet (25 mg total) by mouth every 8 hours as needed for Anxiety. Indications: anxiety 20 tablet 3   ??? hydrOXYzine HCl (ATARAX) 25 MG tablet Take 1 tablet (25 mg total) by mouth every 8 hours as needed for anxiety 20 tablet 3   ??? hydrOXYzine  pamoate (VISTARIL) 25 MG capsule Take one capsule (25 mg) by mouth three times daily. May take an additional capsule (two total capsules, 50 mg) as needed for severe anxiety Indications: anxiety 100 capsule 3   ??? levalbuterol (XOPENEX) 1.25 mg/3 mL nebulizer solution Inhale 3 mL (1.25 mg total) by nebulization every 6 hours as needed for Wheezing. 72 mL 0   ??? levothyroxine (SYNTHROID, LEVOTHROID) 112 MCG tablet Take one tablet by mouth once daily 90 tablet 3   ??? montelukast (SINGULAIR) 10 mg tablet Take 1 tablet (10 mg total) by mouth at bedtime. 90 tablet 1   ??? nebulizer and compressor Devi Use 1 ampule as directed every 4 hours. Indications: Please dispense 1 nebulizer unit 1 each 0   ??? omeprazole (PRILOSEC) 40 MG capsule Take 40 mg by mouth.     ??? onabotulinumtoxinA (BOTOX COSMETIC) 50 unit SolR Inject into the muscle.     ??? predniSONE (DELTASONE) 20 MG tablet TAKE ONE TABLET BY MOUTH DAILY. 30 tablet 0   ??? predniSONE (DELTASONE) 5 MG tablet Take 1 tablet (5 mg total) by mouth daily. Take 3 tablets (15 mg) for 5 days, then  2 tablets (10 mg), then 1 tablet (5 mg) and then stop. 30 tablet 0   ??? ranitidine (ZANTAC) 300 MG tablet Take 1 tablet (300 mg total) by mouth 3 times a day. 90 tablet 11   ??? triamcinolone (KENALOG) 0.1 % ointment Apply topically 2 times a day. 30 g 3     No current facility-administered medications on file prior to visit.         Physical Examination:   LMP  (LMP Unknown)   Wt Readings from Last 3 Encounters:   12/30/16 203 lb 12.8 oz (92.4 kg)   11/15/16 190 lb (86.2 kg)   11/04/16 196 lb (88.9 kg)       General Exam:   GEN: NAD  CV: RRR, no carotid bruits  Pulm: breathing comfortably, CTA b/l  GI: soft, NTTP  Ext: no LEE    Neurologic Exam:   MS:   Alert, oriented to person, place, time and situation.   Language fluent, appropriate, and without dysarthria.     CN:   Visual Fields full to finger-count confrontation   PERRLA, EOM full without diplopia or nystagmus   Facial sensation was intact  to light touch    Facial movements are symmetric   Palate moved in the midline   Tongue moved in the midline   Shoulder shrug symmetric    Motor: R / L (MRC Scale)   Deltoid 5 / 5                     Hip Flexion 5 / 5   Biceps 5 / 5                     Knee extension 5 / 5   Triceps 5 / 5                    Knee flexion 5 / 5   Wrist Extensors 5 / 5      Plantar flexion 5 / 5   Interosei 5 / 5                   Ankle dorsiflex 5 / 5   Normal Tone.     Sensory:   Light touch intact   Pinprick intact   Proprioception intact   Vibration intact   Temperature Intact     Cerebellar:   Finger-to-nose without ataxia   Rapid alternating movements were symmetric     Reflexes: R / L (MRC Scale)   Biceps 2 / 2                    Quadriceps 2 / 2   Triceps 2 / 2                   Achilles 2 / 2   Brachoradialis 2 / 2            Gait: Narrow based. Able to tandem.      Assessment :    Emily Bonilla is a 56 y.o. female who presents with PNES. She seems to understand the diagnosis of PNES. She is receptive to integrative medicine, psychiatry, and psychology for the treatment.     Plan:       -referral to psychology for CBT for PNES  -seizure precautions discussed, she is not driving  -PNES information provided  -no follow-up with me   -will schedule with Dr. Elmyra Ricks in ~1  month for botox        Patient seen and discussed with attending, who is in agreement.       Asencion Noble, MD  Neurology Resident

## 2017-01-04 NOTE — Unmapped (Signed)
[facility]    Frequently Asked Questions about Psychogenic Nonepileptic Seizures    What is a psychogenic nonepileptic seizure?     A seizure is a temporary loss of control. Most seizures are characterized by abnormal movements, unconsciousness or both. Epileptic seizures are caused by a sudden abnormal electrical discharge in the brain. A psychogenic nonepileptic seizure (PNES) is an attack that may look like an epileptic seizure, but it is not caused by abnormal electrical brain discharges. Psychogenic nonepileptic seizures are related to stress or emotion. They are sometimes called pseudoseizures.     Are psychogenic nonepileptic seizures rare?     PNES are the most common condition misdiagnosed as epilepsy, and they are not rare. In general, one in five patients sent to epilepsy centers for difficult-to-control seizures is found to have PNES instead of epileptic seizures.     How can you be sure I have PNES?     Your physician may suspect PNES when the seizures have unusual features, such as the type of movements, duration, triggers and frequency. PNES may look like generalized convulsions (similar to grand-mal seizures) with falling and shaking. They may also mimic petit mal or complex partial seizures with temporary loss of attention, unresponsiveness or staring.     The best test to make this diagnosis is electroencephalogram (EEG) video monitoring. This procedure monitors a patient for several hours to several days with a video camera and an EEG until a seizure occurs. By analyzing the video and EEG recordings, the diagnosis can be made with nearly 100 percent certainty. It is important that the recorded seizures are similar to the type of seizure you typically experience. Sometimes techniques can also be used to trigger seizures during monitoring.     Why have I been told that I have epilepsy?     About 80 percent of patient with PNES have been  treated with antiepileptic drugs for several years before the correct diagnosis is made. There are many reasons for this:    ??? The diagnosis of seizures relies on the descriptions by observers, who may not notice important details.    ??? Few physicians have access to EEG video monitoring, which has to be performed by an epileptologist - a neurologist who specializes in epilepsy.     ??? Because epileptic seizures are potentially more harmful than PNES, physicians in doubt will treat for the more serious condition.     ??? If seizures continue despite medications, then either the treatment needs to be changed or the diagnosis is not epilepsy. At that point patients are sent to an epilepsy center, where the correct diagnosis is usually made.    What about my abnormal EEG?     As mentioned previously, most patients with PNES have received a diagnosis of epilepsy before being correctly diagnosed. Similarly, many have had EEGs reported as abnormal. This is because neurologists who do not specialize in EEG or epilepsy frequently view results as abnormal when specialists would see them as normal.     This is one reason why the diagnosis of PNES should only be made by epileptologists. If you have had an abnormal EEG in the past, it is important that you send the test results to your specialist so he/she can review them further.     About 10 percent of patients with PNES also have epilepsy. If you have both types, it is very important that you and your family learn to distinguish between   the two types.    What causes psychogenic nonepileptic seizures?     PNES, unlike epileptic seizures, are not the result of a physical brain disease. Rather, they are emotional, stress-induced reactions as a result of traumatic psychological experiences. It is well known that emotional or psychological stresses or stressors can produce physical reactions in people with no physical illness. Blushing in embarrassment or being anxious from stage  fright are everyday examples of this. Illnesses that are greatly influenced by psychological or emotional factors are called psychosomatic or mind-body illnesses.    Disorders in which emotional stresses cause symptoms that look like physical illnesses are called somatoform, or 'taking form in the body' disorders, and the most common type is conversion disorder. In fact, the official psychiatric classification (DSM-IV) has a specific category called conversion disorder with seizures. It is important to remember that somatoform disorders are real conditions that arise in response to real stress. Patients are not faking them. The fact that the vast majority of PNES are not consciously produced is often poorly understood by family members and even by some health care professionals.     A specific traumatic event, such as physical or sexual abuse, incest, divorce, death of a loved one, or other great loss or sudden change, can be identified in many patients. Often the underlying trauma has been blocked from the consciousness, and patients can recall the event only with help from a trained therapist. The unconscious processes that cause PNES may also cause or contribute to other conditions, such as depression and anxiety.    Do I really need psychiatric treatment?     PNES, like other conversion disorders, are a psychiatric condition. Some patients are reluctant to believe the diagnosis. With EEG video monitoring performed by an epileptologist, PNES can be shown with near 100 percent reliability to be of psychological origin. Some patients become upset when told that their seizures are psychological. Remember that PNES are not purposely produced, and it isn't your fault that you have them. The psychological factors can be best identified with the help of those with special training in psychological issues.     Your neurologist may continue to see you, but treatment will be provided primarily by a mental health  professional. Treatment may involve psychotherapy, stress-reduction techniques such as relaxation and biofeedback training, and personal support to help you cope with the seizures during the course of treatment.     PNES may cause great difficulties and disruptions in your life. Just because you were not diagnosed with severe epilepsy doesn't mean you do not have a significant problem.     Can I drive?     Many people with PNES have stopped driving, since they have carried a diagnosis of epilepsy. There is no specific law that regulates driving in patients with PNES, but any spell that causes loss of awareness or affects the ability to respond can affect driving.     In most cases, you should not drive until you have been completely free of seizures for a specified period of time, ranging from 3-6 months. The same applies to other potentially dangerous activities that are not advised for people with seizures, such as swimming and climbing.     What is the outlook?     With proper treatment, the seizures eventually disappear in many adults. Psychiatric treatments are not a quick fix and will take time. A common mistake is to refuse the diagnosis and not follow up with the proper treatment.   Unfortunately, patients who make this choice will continue antiepileptic drugs, which will not work and may cause dangerous side-effects or reactions.     An important factor is early diagnosis. The less time patients have carried the wrong diagnosis of epilepsy, the better the chances of full recovery. With the supervision of your neurologist, antiepileptic drugs should be gradually stopped, unless they are stopped during EEG video monitoring.    Document Released: 08/28/2014  Document Revised: 08/28/2014 Document Reviewed: 08/28/2014

## 2017-01-04 NOTE — Unmapped (Signed)
Reviewed AVS with patient. Scheduled follow up appt.  Questions invited and answered. Patient verbalized understanding.  Future Appointments  Date Time Provider Department Center   02/01/2017 1:00 PM Biagio Borg, Ph.D. [facility]   02/03/2017 2:45 PM Jonelle Sidle, MD [facility] PSY OP OP   02/08/2017 10:00 AM Biagio Borg, Ph.D. [facility]   02/15/2017 10:00 AM Biagio Borg, Ph.D. [facility]   02/17/2017 9:20 AM Casimer Leek Mia [place], DO [facility] ALG HOX HOX   02/21/2017 10:30 AM Dorothyann Gibbs, MD [facility] ENT [facility]   02/22/2017 10:30 AM Karlene Lineman, MD [facility] NEUR HOX HOX

## 2017-01-05 MED ORDER — triamcinolone (KENALOG) 0.1 % ointment
0.1 | TOPICAL | 11 refills | 1.00000 days | Status: AC
Start: 2017-01-05 — End: 2017-12-22

## 2017-01-20 NOTE — Unmapped (Signed)
Lawanna Kobus with [facility] requesting H and P, Med list, and diagnosis for admission to assisted living.     Please fax to [phone].

## 2017-01-27 ENCOUNTER — Ambulatory Visit: Payer: PRIVATE HEALTH INSURANCE

## 2017-02-01 ENCOUNTER — Ambulatory Visit: Payer: PRIVATE HEALTH INSURANCE

## 2017-02-01 ENCOUNTER — Ambulatory Visit: Admit: 2017-02-01 | Discharge: 2017-02-01 | Payer: PRIVATE HEALTH INSURANCE

## 2017-02-01 DIAGNOSIS — F4323 Adjustment disorder with mixed anxiety and depressed mood: Secondary | ICD-10-CM

## 2017-02-01 NOTE — Unmapped (Signed)
Appointment time in: 10am  Appointment time out:1055am    Emily Bonilla was in the office on 02/01/2017 for stress associated with health condition.    Patient is a 57 y.o. female     Referred by: Dr. Nickie Retort    Occupation:  Retired Naval architect History: college graduate    Personal Relationships:  divorced        Primary Reason for Consultation:   Stress management    Top 3 Objectives that Patient would like to achieve as a result from sessions:  Learn new coping skills, relaxation, continue mindful meditation         Current Stressors [including any perceived barriers that are inhibiting patient from reaching goals:  Medical conditions causing anxiety, daily stressors so need to make decisions about assisted living.  Feel more hopeful about health improving      PERSONAL HABITS     Typical Eating Habits: well     Movement / Exercise Routines: not much     Sleep Routine: 8-9 hrs; reports needing to stay within a strict bed time.      Medical Conditions or Injuries:   Describe any in the past or may be experiencing currently: see chart    Medications: (prescription and non-prescription)     List : see chart     Family History:   Describe:  Divorced, daughter now married and has children.  Family of origin in [place], [place].  Father deceased,  Mother and sister in [place], brother in [place].      MENTAL STATUS  Motor Behavior: no psychomotor abnormalities    Cognition: alert    Attitude: cooperative    Affect: sad, tearful, appropriate to thought and anxious    Appearance: appropriately dressed    Speech: normal rate    Mood: depressed and anxious    Thought Processes: well organized and no looseness of association     Perceptions: no hallucinations or other abnormalities      Thought content: no delusions, low self-esteem and demoralized    Insight/ judgement: no impairments    Suicidal ideation: none    Homicidal ideation: none    Goals:  Stress management for illness and life management.  Coping  tools

## 2017-02-03 ENCOUNTER — Ambulatory Visit: Admit: 2017-02-03 | Payer: PRIVATE HEALTH INSURANCE | Attending: Psychiatry

## 2017-02-03 DIAGNOSIS — F419 Anxiety disorder, unspecified: Secondary | ICD-10-CM

## 2017-02-03 MED ORDER — busPIRone (BUSPAR) 10 MG tablet
10 | ORAL_TABLET | Freq: Three times a day (TID) | ORAL | 3 refills | Status: AC
Start: 2017-02-03 — End: 2017-04-14

## 2017-02-03 MED ORDER — hydrOXYzine pamoate (VISTARIL) 25 MG capsule
25 | ORAL_CAPSULE | ORAL | 3 refills | Status: AC
Start: 2017-02-03 — End: 2017-04-14

## 2017-02-03 NOTE — Unmapped (Signed)
Please take your medication as prescribed and follow-up in 10 weeks.    Please follow-up with [facility].    If you develop any medication side effects or have any concerns about your mental health or your medication, please call [phone].    If feeling suicidal, call the suicide hotline at 513-281-CARE (2273), 911, or return to the emergency room.     SUICIDE PREVENTION RESOURCES:   [facility]:  513-281-CARE [phone]) hotline      [facility]: [phone]      Mobile Crisis Team [phone]   [facility]:  [facility] Consultation & Crisis: [phone]   [facility]: Emergency Crisis Hotline: 513-528-SAVE (201) [phone])   [place]: [facility] Emergency Crisis Line: 424-864-6454   National:  National Hotline: 1-800-273-TALK (8255)      Www.suicidepreventionlifeline.orgg  ------------------------------------------------------------------------------------------------------------------  Discharge Instructions After an Episode of Suicide Threats or Actions   Remove all fireams, weapons (of any kind) or any unneeded medicines that could be used   Take person to follow-up mental health appointments   Have a loved one sign a release of information allowing you to contact the mental health provider   Be direct and talk openly about suicidal thoughts   Be willing to listen. Allow expressions of feelings   Block all inappropriate internet websites and social media   Offer to be available to them at any time   Be available to accompany them to the nearest emergency department   Call other family members or friends to help and offer support       Get help from agencies that specialize in crisis intervention   Using the information here and other community resources, create a suicide safety plan. Example: call support networks, crisis lines, 911, go to emergency room, call Mobile Crisis Team, call mental health provider    Warning Signs:   Severe depression with  restlessness   Inability to sleep or sleeping too much   Withdrawing from friends, family and life   Increase alcohol and/or drug use   Giving away personal items   Anxiety, agitation, rage, or anger   Unpredictable mood swings   Feelings like there is no way out   Hopelessness   Constantly talking or writing about death   Seeking out information about suicide    What Family Members & Friends Should Know:   Suicidal ideas are usually associated with treatable conditions   People who try or actually commit suicide try to let someone know by leaving a note   All suicide threats should be taken seriously   Suicidal ideas occur when people are depressed, intoxicated or irrational   Suicidal thinking can consume a person   Suicide risk increases when a person who has been severely depressed suddenly has more energy   Most common ways of suicide - pills, guns, poisons, hanging, carbon monoxide breathing, jumping off high places, and accidents    How Can I Help When Someone is Threatening Suicide?   Take Action!    1. Take his/her words seriously and respond with compassion    2. Do not leave them alone    3. Call 911 and have person taken to emergency room    4. Accompany the person to the emergency room and provide the physician with information

## 2017-02-03 NOTE — Unmapped (Signed)
Visit Start Time: 2:45 PM  Visit Quit Time: 3:30 PM    Chief complaint: Feeling well    Context:??allergic reactions, preoccupation with somatic symptoms  Location: Altered mental status of anxiety  Duration: chronic  Severity:??mild  Associated Symptoms: anxiety  Modifying Factors: not currently in therapy        History of Present Illness:   Emily Bonilla is a 57 y.o. female with PMH unspecified anxiety disorder, atrial fibrillation, WPW, idiopathic mast cell activation, anemia, asthma, hypothyroidism, GERD, intermittent chronic HA, PNES, [facility].    Last seen in the clinic 11/25/16. Current psychiatric medications: Buspirone 20 mg TID, Hydroxyzine 25 mg TID with an additional 25 mg daily PRN, Doxepin 10 mg QHS (Doxepin prescribed by her allergist)    HPI       Patient reports doing well. Denies feel sad, amotivation, or anhedonia. Good appetite and sleeping well. Has low energy which she contributes to CVID. Denies SI. Reports occasionally feeling overwhelmed but denies excessive worry or feeling anxious. Says anxiety has improved from last visit. Denies any panic attacks. Medications helpful. Denies any SEs. Has been taking Hydroxyzine TID scheduled with Buspirone. Since the last visit, has not needed to take the extra 25 mg of Hydroxyzine. Had an appointment with [facility] and will start biofeedback next week. Is very excited about this. Also looking into massage therapy and acupuncture. 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 Has accepted this diagnosis but is not interested in CBT right now, prefers to do Integrative Medicine. Continues to be preoccupied with medical symptoms but reports she has decreased her use of anti-histamines - taking Zyrtec once daily and Benadryl 75-150 mg per day. Continues to have home aid 4 hours per day which is helpful.    Patient History:     Psychiatric History:     Prior Inpatient Psychiatric  Hospitalization(s): denies  Prior Psychotropic Medications: reports taking prior psychotropic medications.  Current Psychotropic Medications: yes - see below current psychotropic medications.  SA: denies    Past Medical History:   Diagnosis Date   ??? Anaphylaxis    ??? Anemia    ??? Anxiety    ??? Asthma    ??? Atrial fibrillation (CMS Dx)    ??? Bronchitis    ??? Heart disease    ??? Neurological disease    ??? OSA (obstructive sleep apnea)     states resolved   ??? Osteoporosis    ??? Psychogenic nonepileptic seizure    ??? Spontaneous pneumothorax     states pneumomediatsium/pneumopericardium, self resolved   ??? Thyroid disease    ??? Ulcer    ??? WPW (Wolff-Parkinson-White syndrome)        Past Surgical History:   Procedure Laterality Date   ??? BONE MARROW BIOPSY      benign   ??? CARDIAC ELECTROPHYSIOLOGY MAPPING AND ABLATION      attempted x 2, states not successful either time   ??? CHOLECYSTECTOMY     ??? FACIAL COSMETIC SURGERY     ??? HYSTERECTOMY     ??? LIVER BIOPSY      benign hepatic adednoma per patient   ??? REMOVAL HARDWARE LEG Left 08/17/2016    Procedure: REMOVAL OF HARDWARE LEFT TIBIA;  Surgeon: Andee Lineman, MD;  Location: [facility] OR;  Service: Orthopedics;  Laterality: Left;   ??? TIBIA FRACTURE SURGERY     ??? TONSILLECTOMY      + adenoids  Social History     Social History   ??? Marital status: Divorced     Spouse name: N/A   ??? Number of children: N/A   ??? Years of education: N/A     Social History Main Topics   ??? Smoking status: Never Smoker   ??? Smokeless tobacco: Never Used      Comment: Denies (11/15/2016)   ??? Alcohol use No      Comment: Denies (11/15/2016)   ??? Drug use: No      Comment: Denies (11/15/2016)   ??? Sexual activity: Not Currently     Other Topics Concern   ??? Caffeine Use Yes   ??? Exercise No   ??? Seat Belt Yes     Social History Narrative   ??? No narrative on file       Family History   Problem Relation Age of Onset   ??? Kidney failure Mother    ??? Aneurysm Mother        Review of Systems   +weight gain (patient reports 52  lb weight gain since September), +joint pain  Denies CP, SOB, abdominal pain, nausea, vomiting    Allergies:   Acetaminophen; Aspirin; Blue dye; Epinephrine; Flecainide; Gabapentin; Iodinated contrast- oral and iv dye; Isopropyl alcohol; Latex; Lidocaine; Nsaids (non-steroidal anti-inflammatory drug); Onion; Other; Perfume; Pregabalin; Quetiapine; Red dye; Sulfur; Yellow dye; Amitriptyline; Doxepin; Duloxetine; Epi e-z pen; Hydroxyzine; Levofloxacin; Montelukast; Verapamil (bulk); Adhesive tape-silicones; and Trazodone    Medications:     Outpatient Encounter Prescriptions as of 02/03/2017   Medication Sig Dispense Refill   ??? busPIRone (BUSPAR) 10 MG tablet Take 2 tablets (20 mg total) by mouth 3 times a day. Indications: Generalized Anxiety Disorder 180 tablet 3   ??? cromolyn (GASTROCROM) 100 mg/5 mL solution Take 5 mLs (100 mg total) by mouth 4 times daily before meals and at bedtime. 480 mL 11   ??? diphenhydrAMINE (BENADRYL) 25 mg capsule Take 75-150 mg by mouth daily.     ??? doxepin (SINEQUAN) 10 MG capsule Take 1 capsule (10 mg total) by mouth at bedtime. 90 capsule 3   ??? fexofenadine (ALLEGRA) 180 MG tablet Take 1 tablet (180 mg total) by mouth daily. 30 tablet 3   ??? furosemide (LASIX) 20 MG tablet Take 1 tablet (20 mg total) by mouth daily. 180 tablet 1   ??? hydrOXYzine HCl (ATARAX) 25 MG tablet Take 1 tablet (25 mg total) by mouth every 8 hours as needed for Anxiety. Indications: anxiety 20 tablet 3   ??? hydrOXYzine HCl (ATARAX) 25 MG tablet Take 1 tablet (25 mg total) by mouth every 8 hours as needed for anxiety 20 tablet 3   ??? hydrOXYzine pamoate (VISTARIL) 25 MG capsule Take one capsule (25 mg) by mouth three times daily. May take an additional capsule (two total capsules, 50 mg) as needed for severe anxiety Indications: anxiety 100 capsule 3   ??? levalbuterol (XOPENEX) 1.25 mg/3 mL nebulizer solution Inhale 3 mL (1.25 mg total) by nebulization every 6 hours as needed for Wheezing. 72 mL 0   ??? levothyroxine  (SYNTHROID, LEVOTHROID) 112 MCG tablet Take one tablet by mouth once daily 90 tablet 3   ??? montelukast (SINGULAIR) 10 mg tablet Take 1 tablet (10 mg total) by mouth at bedtime. 90 tablet 1   ??? nebulizer and compressor Devi Use 1 ampule as directed every 4 hours. Indications: Please dispense 1 nebulizer unit 1 each 0   ??? omeprazole (PRILOSEC) 40 MG capsule Take 40  mg by mouth.     ??? onabotulinumtoxinA (BOTOX COSMETIC) 50 unit SolR Inject into the muscle.     ??? predniSONE (DELTASONE) 20 MG tablet TAKE ONE TABLET BY MOUTH DAILY. 30 tablet 0   ??? predniSONE (DELTASONE) 5 MG tablet Take 1 tablet (5 mg total) by mouth daily. Take 3 tablets (15 mg) for 5 days, then 2 tablets (10 mg), then 1 tablet (5 mg) and then stop. 30 tablet 0   ??? ranitidine (ZANTAC) 300 MG tablet Take 1 tablet (300 mg total) by mouth 3 times a day. 90 tablet 11   ??? triamcinolone (KENALOG) 0.1 % ointment APPLY 2 TIMES DAILY 80 g 11     No facility-administered encounter medications on file as of 02/03/2017.         Outpatient Medications Prior to Visit   Medication Sig Dispense Refill   ??? busPIRone (BUSPAR) 10 MG tablet Take 2 tablets (20 mg total) by mouth 3 times a day. Indications: Generalized Anxiety Disorder 180 tablet 3   ??? cromolyn (GASTROCROM) 100 mg/5 mL solution Take 5 mLs (100 mg total) by mouth 4 times daily before meals and at bedtime. 480 mL 11   ??? diphenhydrAMINE (BENADRYL) 25 mg capsule Take 75-150 mg by mouth daily.     ??? doxepin (SINEQUAN) 10 MG capsule Take 1 capsule (10 mg total) by mouth at bedtime. 90 capsule 3   ??? fexofenadine (ALLEGRA) 180 MG tablet Take 1 tablet (180 mg total) by mouth daily. 30 tablet 3   ??? furosemide (LASIX) 20 MG tablet Take 1 tablet (20 mg total) by mouth daily. 180 tablet 1   ??? hydrOXYzine HCl (ATARAX) 25 MG tablet Take 1 tablet (25 mg total) by mouth every 8 hours as needed for Anxiety. Indications: anxiety 20 tablet 3   ??? hydrOXYzine HCl (ATARAX) 25 MG tablet Take 1 tablet (25 mg total) by mouth every 8  hours as needed for anxiety 20 tablet 3   ??? hydrOXYzine pamoate (VISTARIL) 25 MG capsule Take one capsule (25 mg) by mouth three times daily. May take an additional capsule (two total capsules, 50 mg) as needed for severe anxiety Indications: anxiety 100 capsule 3   ??? levalbuterol (XOPENEX) 1.25 mg/3 mL nebulizer solution Inhale 3 mL (1.25 mg total) by nebulization every 6 hours as needed for Wheezing. 72 mL 0   ??? levothyroxine (SYNTHROID, LEVOTHROID) 112 MCG tablet Take one tablet by mouth once daily 90 tablet 3   ??? montelukast (SINGULAIR) 10 mg tablet Take 1 tablet (10 mg total) by mouth at bedtime. 90 tablet 1   ??? nebulizer and compressor Devi Use 1 ampule as directed every 4 hours. Indications: Please dispense 1 nebulizer unit 1 each 0   ??? omeprazole (PRILOSEC) 40 MG capsule Take 40 mg by mouth.     ??? onabotulinumtoxinA (BOTOX COSMETIC) 50 unit SolR Inject into the muscle.     ??? predniSONE (DELTASONE) 20 MG tablet TAKE ONE TABLET BY MOUTH DAILY. 30 tablet 0   ??? predniSONE (DELTASONE) 5 MG tablet Take 1 tablet (5 mg total) by mouth daily. Take 3 tablets (15 mg) for 5 days, then 2 tablets (10 mg), then 1 tablet (5 mg) and then stop. 30 tablet 0   ??? ranitidine (ZANTAC) 300 MG tablet Take 1 tablet (300 mg total) by mouth 3 times a day. 90 tablet 11   ??? triamcinolone (KENALOG) 0.1 % ointment APPLY 2 TIMES DAILY 80 g 11     No facility-administered  medications prior to visit.        Objective:   Physical Exam    Vitals:    02/03/17 1358   BP: 135/67   Pulse: 86     Gait and Stations: Normal    Mental Status Exam:   Appearance: middle-aged Caucasian female, appears stated age, good hygiene and grooming, fair eye contact, cooperative  Activity: no PMR or PMA, no abnormal movements noted  Speech: hyper-verbal and mildly pressured but easily able to be interrupted, at times loud,??normal latency of response, fluent with no aphasia  Mood/Affect: good affect congruent with stated mood, full range, euthymic  Thought  Process: organized, linear, goal-directed, no LOA or FOI  Thought Content: talks about improvement in her anxiety, very focused on her health symptoms, denies SI, does not express HI, no delusions expressed  Perceptions: does not appear to be internally preoccupied or RTIS  Attention: fair to conversation  Fund of Knowledge:average  Memory:appropriate to conversation  Insight/Judgement: fair/fair  ??  PHQ-9 Scores:   PHQ Total Score 03/30/2016 05/21/2016 06/24/2016 08/26/2016   PHQ-9 Total Score 0 0 0 9       GAD7 Scores:   GAD7Total Score 08/26/2016 11/25/2016 02/03/2017   GAD-7 Total Score 10 6 2           Assessment/Plan:       Emily Bonilla is a 57 y.o. female with PMH unspecified anxiety disorder, atrial fibrillation, WPW, idiopathic mast cell activation, anemia, asthma, hypothyroidism, GERD, intermittent chronic HA, PNES, [facility] for [facility]-up.     The patient's anxiety has significantly improved on the current medication regimen of Buspirone and Hydroxyzine. She continues to be preoccupied with her medical symptoms and has notable illness anxiety which appears to be an exaggerated response to objective data of her medical diagnoses (for instance awaiting records to determine if she has a formal diagnosis of mast cell activation syndrome, previous allergists concluded that she did not have mastocytosis). She is very motivated to follow-up with integrative medicine. Will continue current medication regimen given beneficial effects and will decrease Hydroxyzine to 25 mg TID PRN 90 pills per month (as patient has not required the extra 25 mg daily PRN) and encouraged patient to try taking Hydroxyzine PRN as opposed to scheduled.  ??  DSM-5 Diagnoses: Unspecified anxiety disorder. R/O Adjustment disorder with anxiety. Cluster B traits (particularly histrionic). R/O Somatic symptom disorder. R/O Illness anxiety disorder. R/O Factitious disorder  Medical Diagnoses: Atrial fibrillation, WPW,  idiopathic mast cell activation, anemia, asthma, hypothyroidism, GERD, intermittent chronic HA, PNES  GAF: 65  ??  # Anxiety, significantly improved:  -Continue Buspirone 20 mg TID for anxiety  -Continue Hydroxyzine 25 mg TID PRN for anxiety. Encouraged to try taking medication PRN as opposed to scheduled  -Reached out to Dr. Nicholes Stairs regarding uncertain diagnosis of mast cell activation syndrome - awaiting records from [place] and [place] for clarification  -Follow-up with integrative medicine for biofeedback, massage therapy, acupuncture   -Patient not interested in CBT at this time  -RTC in 10 weeks  ??  Discussed risks/benefits/side effects/alternatives to medications.??Patient expressed understanding of and agreement to the current treatment plan.   ??  # Safety: The patient's imminent risk appears low, as she denies SI, does not express HI, no previous SA, future-oriented and goal-oriented to continue treatment. Patient expressed understanding of resources, including 911 and PES.  ??  # Substance:  -No active issues  ??  # Medical:  -Weight gain: patient encouraged  to follow-up with her PCP  ??  Jonelle Sidle, MD  Psychiatry PGY-3  Pager: [phone]  ??  Patient seen, plan discussed and agreed upon by attending Dr. Thomasena Edis

## 2017-02-08 ENCOUNTER — Ambulatory Visit: Admit: 2017-02-08 | Discharge: 2017-02-08 | Payer: PRIVATE HEALTH INSURANCE

## 2017-02-08 DIAGNOSIS — F4323 Adjustment disorder with mixed anxiety and depressed mood: Secondary | ICD-10-CM

## 2017-02-08 NOTE — Unmapped (Signed)
In time: 10am  Out time: 1055am    Chief Issue: stress/anxiety about health     Subjective:   Pt discussed additional symptoms that she has from her disordered health.  Bloating stomach today and stated that symptoms change constantly.   Tries to find a way to stay optimistic and positive and not give in to the illness.  Gets very sad and down when she thinks about what used to be too much.  Stated that has tried antidepressants in the past and hasn't done well on them - in fact, made her feel very depressed and suicidal.  Wants to learn additional strategies to manage her stress and learn how to relax and get recentered if starts going on a path of negativity. Spends most of her time alone at home.        Objective:   Very optimistic and positive on the outside especially with what she is managing health and financially.  MSE  Motor Behavior: no psychomotor abnormalities    Cognition: alert    Attitude: cooperative    Affect: sad, tearful, appropriate to thought and anxious    Appearance: appropriately dressed    Speech: normal rate    Mood: depressed, consistent and anxious    Thought Processes: well organized and no looseness of association     Perceptions: no hallucinations or other abnormalities      Thought content: no delusions, low self-esteem and demoralized    Insight/ judgement: no impairments    Suicidal ideation: none    Homicidal ideation: none    Symptoms observed during session:  Agitation, anhedonia, anxiety/fear,  decreased energy/fatigue, mildly depressed, emotional lability, sleep disturbance, restlessness, sad/pained/worried expression,Open coachable motivated      Assessment:     Intervention strategies implemented and session focus was cognitive behavioral, insight-oriented, behavior modification, supportive, biofeedback, mindfulness strategies    Disc ANS and biofeedback.  Started on this x1 with diaphragmatic breath visualizing past and then with eyes closed.  Understands concept but then  discussed how she has done this in the past with breathing.  Will try again at next session.   Had difficulty taking deep breath today b/c of distended abdomen which was pressing against diaphragm.      Plan:     Homework:   Practice breathing techniques  Continuing physical exercise  Follow bedtime routine with consistency    Future treatment/follow-up issues:    Two weeks  Has tried hypnosis and deep relaxation in past - is looking for a way to find this again, as well as a way to recenter when thoughts get negative.

## 2017-02-10 NOTE — Unmapped (Signed)
PA for Botox 155 units faxed to [facility] confirmation received

## 2017-02-14 NOTE — Unmapped (Signed)
PA for Botox approved     [facility]  03/03/17-09/06/17  PA# [id number]  155 Units only x 2 doses

## 2017-02-15 ENCOUNTER — Ambulatory Visit: Admit: 2017-02-15 | Discharge: 2017-02-15 | Payer: PRIVATE HEALTH INSURANCE

## 2017-02-15 DIAGNOSIS — F4323 Adjustment disorder with mixed anxiety and depressed mood: Secondary | ICD-10-CM

## 2017-02-15 NOTE — Unmapped (Signed)
In:  10am  OUt:  1055am    Chief Issue: stress/anxiety about health   ??  Subjective:   Pt discussed additional symptoms that she has from her disordered health.  Bloating stomach today and stated that symptoms change constantly.   Tries to find a way to stay optimistic and positive and not give in to the illness.  Gets very sad and down when she thinks about what used to be too much.  Practiced a session of meditation within the office and stated that felt more relaxed and taller.  Discussed multiple symptoms that she continues to experience. spends most of her time alone at home.        Objective:   Very optimistic and positive on the outside especially with what she is managing health and financially.  MSE  Motor Behavior: no psychomotor abnormalities  ??  Cognition: alert  ??  Attitude: cooperative  ??  Affect: sad, tearful, appropriate to thought and anxious  ??  Appearance: appropriately dressed  ??  Speech: normal rate  ??  Mood: depressed, consistent and anxious  ??  Thought Processes: well organized and no looseness of association   ??  Perceptions: no hallucinations or other abnormalities    ??  Thought content: no delusions, low self-esteem and demoralized  ??  Insight/ judgement: no impairments  ??  Suicidal ideation: none  ??  Homicidal ideation: none  ??  Symptoms observed during session:  Agitation, anhedonia, anxiety/fear,  decreased energy/fatigue, mildly depressed, emotional lability, sleep disturbance, restlessness, sad/pained/worried expression,Open coachable motivated  ??  ??  Assessment:   ??  Intervention strategies implemented and session focus was cognitive behavioral, insight-oriented, behavior modification, supportive, biofeedback, mindfulness strategies  ??  Disc doing some additional practice of visualization/guided/hypnotherapy type practice to find a way to help her relax.  Stays tense constantly.    Plan:   ??  Homework:   Practice breathing techniques  Continuing physical exercise  Follow bedtime  routine with consistency  ??  Future treatment/follow-up issues:  ??  Two weeks

## 2017-02-17 ENCOUNTER — Ambulatory Visit: Payer: PRIVATE HEALTH INSURANCE

## 2017-02-17 NOTE — Unmapped (Signed)
Understandable. The weather has been terrible for the last 2 days. I am happy to see Ms. Parmer sooner. Please double book my schedule for February 21 at 9AM. Thank you, Marni Griffon, D.O.  Allergy and Immunology Fellow  Pager: [phone]

## 2017-02-17 NOTE — Unmapped (Signed)
Pt calling to reschedule her appointment. States she can not leave her her house as her water is frozen (has no water). The first available is on 05/05/17 with Dr. Nicholes Stairs. Pt would like to come in sooner. Please advise.    Future Appointments  Date Time Provider Department Center   02/23/2017 10:00 AM Jeneen Rinks, RN, LMT [facility]   02/28/2017 11:00 AM Biagio Borg, Ph.D. [facility]   03/08/2017 10:30 AM Karlene Lineman, MD [facility]   03/14/2017 11:00 AM Biagio Borg, Ph.D. [facility]   04/14/2017 2:45 PM Jonelle Sidle, MD [facility] PSY OP OP

## 2017-02-21 ENCOUNTER — Ambulatory Visit: Payer: PRIVATE HEALTH INSURANCE | Attending: Otolaryngic Allergy

## 2017-02-22 ENCOUNTER — Ambulatory Visit: Payer: PRIVATE HEALTH INSURANCE | Attending: Neurology

## 2017-02-23 ENCOUNTER — Ambulatory Visit: Admit: 2017-02-23 | Discharge: 2017-02-23

## 2017-02-23 DIAGNOSIS — Z5189 Encounter for other specified aftercare: Secondary | ICD-10-CM

## 2017-02-23 NOTE — Unmapped (Signed)
S- getting botox next week so wants work to HNS to release tension. Also has fallen recently and needs work to hips where she is very sore. Also wants work to legs r/t muscle spasms that become painful.  O- no obvious restriction of movement noted.  A- PRone- tolerated heat rolls to back area while working legs and gluteals.  Pain to medial distal tibial left scar area with light scar work and massage. Worked back area with moderate to mild pressure. Discomfort with work and very tight to levator scapular muscle bilaterally. Worked cervicals/suboccipital with slight traction to work. Hot stones used to release tightness to muscles. Supine- worked HNS area with very slight stretch to work. O/A release done with mild release and spread. Ear,hand and foot reflexology done. Worked scar area with side to side strokes. Ended with inner mouth work to [employer] with good release.  P RTC q week for further release.

## 2017-02-28 ENCOUNTER — Ambulatory Visit: Admit: 2017-02-28 | Discharge: 2017-02-28 | Payer: PRIVATE HEALTH INSURANCE

## 2017-02-28 DIAGNOSIS — F4323 Adjustment disorder with mixed anxiety and depressed mood: Secondary | ICD-10-CM

## 2017-02-28 NOTE — Unmapped (Signed)
In:  11am  OUt:  1155am  ??  Chief Issue: stress/anxiety about health??  ??  Subjective:   Pt discussed additional symptoms that she has from her disordered health. ??Bloating stomach today and stated that symptoms change constantly. ??Tries to find a way to stay optimistic and positive and not give in to the illness. ??Gets very sad and down when she thinks about what used to be too much. Practiced another relaxation exercise (pick a spot) and felt very light in the arms near the end.  Came in with 'fluttering vision/stated that she was seeing cloudiness', but after relaxation session, cloudiness dissipated.  Will try to practice relaxation just before session as well so that we can try to find an even deeper state for next session. ??discussed that her aids cook for her and isn't able to do anything with her stove for safety reasons.   ????  ??  Objective:   Very optimistic and positive on the outside especially with what she is managing health and financially.  MSE  Motor Behavior:??no psychomotor abnormalities  ??  Cognition: alert  ??  Attitude: cooperative  ??  Affect: sad, tearful, appropriate to thought and anxious  ??  Appearance: appropriately dressed  ??  Speech: normal rate  ??  Mood:??depressed, consistent and anxious  ??  Thought Processes:??well organized and no looseness of association??  ??  Perceptions:??no hallucinations or other abnormalities????  ??  Thought content:??no delusions, low self-esteem and demoralized  ??  Insight/ judgement:??no impairments  ??  Suicidal ideation:??none  ??  Homicidal ideation:??none  ??  Symptoms observed during session:  Agitation,??anhedonia,??anxiety/fear,????decreased energy/fatigue, mildly??depressed,??emotional lability, sleep disturbance, restlessness,??sad/pained/worried expression,Open coachable motivated  ??  ??  Assessment:   ??  Intervention strategies implemented and session focus was cognitive behavioral,??insight-oriented,??behavior modification,??supportive,??biofeedback,??mindfulness  strategies  ??  Disc doing some additional practice of visualization/guided/hypnotherapy type practice to find a way to help her relax.  Stays tense constantly.  ??  Plan:   ??  Homework:   Practice breathing techniques  Continuing physical exercise  Follow bedtime routine with consistency  ??  Future treatment/follow-up issues:  ??  Two weeks

## 2017-03-03 NOTE — Unmapped (Signed)
[facility] H&P    03/03/2017               1:12 PM  Patient:Emily Bonilla    History: Emily Bonilla is a 57 y.o. female with history significant for Mast cell degranulation disorder, a-fib w/ WPW. Patient has coded twice secondary to anaphylaxis with asystole with return of ROSC with Code epinephrine administered. Patient is severely atopic with sensitivities to multiple substances including all local anesthetics and all narcotics except dilaudid. Patient has undergone multiple anesthetics in the past for fractures and removal of hardware. Patient presents today with referral for extraction of all remaining teeth. Patient is aware that her dentition is not beyond repair but she reports that no dentist will treat her due to concern of exacerbating her condition. Patient reports that triggers for her condition include, medications, stress, smells in the air, anesthetic mask, previously removed titanium from her leg fractures. Patient is wondering what her options are in terms of getting her teeth repaired/extracted and the possibility of having a denture made that doesn't induce mast cell degranulation. Patient is hopeful that we will be able to facilitate her care because her rheumatology and allergy team are here at [facility] and will be able to assist in her treatment.    Active Problems:    * No active hospital problems. *    Past Medical History:   Diagnosis Date   ??? Anaphylaxis    ??? Anemia    ??? Anxiety    ??? Asthma    ??? Atrial fibrillation (CMS Dx)    ??? Bronchitis    ??? Heart disease    ??? Neurological disease    ??? OSA (obstructive sleep apnea)     states resolved   ??? Osteoporosis    ??? Psychogenic nonepileptic seizure    ??? Spontaneous pneumothorax     states pneumomediatsium/pneumopericardium, self resolved   ??? Thyroid disease    ??? Ulcer    ??? WPW (Wolff-Parkinson-White syndrome)      Past Surgical History:   Procedure Laterality Date   ??? BONE MARROW BIOPSY      benign   ??? CARDIAC ELECTROPHYSIOLOGY MAPPING AND ABLATION       attempted x 2, states not successful either time   ??? CHOLECYSTECTOMY     ??? FACIAL COSMETIC SURGERY     ??? HYSTERECTOMY     ??? LIVER BIOPSY      benign hepatic adednoma per patient   ??? REMOVAL HARDWARE LEG Left 08/17/2016    Procedure: REMOVAL OF HARDWARE LEFT TIBIA;  Surgeon: Andee Lineman, MD;  Location: [facility] OR;  Service: Orthopedics;  Laterality: Left;   ??? TIBIA FRACTURE SURGERY     ??? TONSILLECTOMY      + adenoids     Family History   Problem Relation Age of Onset   ??? Kidney failure Mother    ??? Aneurysm Mother      Social History   Substance Use Topics   ??? Smoking status: Never Smoker   ??? Smokeless tobacco: Never Used      Comment: Denies (11/15/2016)   ??? Alcohol use No      Comment: Denies (11/15/2016)     Allergies   Allergen Reactions   ??? Acetaminophen Anaphylaxis     Unknown   ??? Aspirin Anaphylaxis     Unknown   ??? Blue Dye Anaphylaxis     Unknown   ??? Epinephrine Anaphylaxis   ??? Flecainide Shortness Of Breath  Itching, decreased libido, wheezing, flushing, numbness.    ??? Gabapentin Anaphylaxis   ??? Iodinated Contrast- Oral And Iv Dye Anaphylaxis   ??? Isopropyl Alcohol Anaphylaxis   ??? Latex Anaphylaxis and Rash   ??? Lidocaine Anaphylaxis   ??? Nsaids (Non-Steroidal Anti-Inflammatory Drug) Anaphylaxis   ??? Onion Anaphylaxis   ??? 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   ??? Perfume Anaphylaxis   ??? Pregabalin Anaphylaxis   ??? Quetiapine Anaphylaxis     Mental Status Change   ??? Red Dye Anaphylaxis     Unknown   ??? Sulfur Anaphylaxis     Pt states: Any alcohol product   ??? Yellow Dye Anaphylaxis     Unknown   ??? Amitriptyline    ??? Doxepin Other (See Comments)     Unknown   ??? Duloxetine      Other reaction(s): Other (See  Comments)  Mental Status Change   ??? Epi E-Z Pen    ??? Hydroxyzine Itching     Unknown   ??? Levofloxacin Itching   ??? Montelukast    ??? Verapamil (Bulk) Hives     Insomnia, flushing, scattered brain, migraine, itching, anxiety.    ??? Adhesive Tape-Silicones Itching, Rash and Swelling     Unknown   ??? Trazodone Rash       Prior to Admission Meds:  Current Outpatient Prescriptions on File Prior to Visit   Medication Sig Dispense Refill   ??? busPIRone (BUSPAR) 10 MG tablet Take 2 tablets (20 mg total) by mouth 3 times a day. Indications: Generalized Anxiety Disorder 180 tablet 3   ??? cromolyn (GASTROCROM) 100 mg/5 mL solution Take 5 mLs (100 mg total) by mouth 4 times daily before meals and at bedtime. 480 mL 11   ??? diphenhydrAMINE (BENADRYL) 25 mg capsule Take 75-150 mg by mouth daily.     ??? doxepin (SINEQUAN) 10 MG capsule Take 1 capsule (10 mg total) by mouth at bedtime. 90 capsule 3   ??? fexofenadine (ALLEGRA) 180 MG tablet Take 1 tablet (180 mg total) by mouth daily. 30 tablet 3   ??? furosemide (LASIX) 20 MG tablet Take 1 tablet (20 mg total) by mouth daily. 180 tablet 1   ??? hydrOXYzine pamoate (VISTARIL) 25 MG capsule Take one capsule (25 mg) by mouth three times daily as needed for anxiety Indications: anxiety 90 capsule 3   ??? levalbuterol (XOPENEX) 1.25 mg/3 mL nebulizer solution Inhale 3 mL (1.25 mg total) by nebulization every 6 hours as needed for Wheezing. 72 mL 0   ??? levothyroxine (SYNTHROID, LEVOTHROID) 112 MCG tablet Take one tablet by mouth once daily 90 tablet 3   ??? montelukast (SINGULAIR) 10 mg tablet Take 1 tablet (10 mg total) by mouth at bedtime. 90 tablet 1   ??? nebulizer and compressor Devi Use 1 ampule as directed every 4 hours. Indications: Please dispense 1 nebulizer unit 1 each 0   ??? omeprazole (PRILOSEC) 40 MG capsule Take 40 mg by mouth.     ??? onabotulinumtoxinA (BOTOX COSMETIC) 50 unit SolR Inject into the muscle.     ??? predniSONE (DELTASONE) 20 MG tablet TAKE ONE TABLET BY MOUTH DAILY. 30 tablet 0    ??? predniSONE (DELTASONE) 5 MG tablet Take 1  tablet (5 mg total) by mouth daily. Take 3 tablets (15 mg) for 5 days, then 2 tablets (10 mg), then 1 tablet (5 mg) and then stop. 30 tablet 0   ??? ranitidine (ZANTAC) 300 MG tablet Take 1 tablet (300 mg total) by mouth 3 times a day. 90 tablet 11   ??? triamcinolone (KENALOG) 0.1 % ointment APPLY 2 TIMES DAILY 80 g 11     No current facility-administered medications on file prior to visit.        Review of Systems:  Constitutional: Negative for fevers/chills,  HENT: Negative for drooling and nosebleeds.    Eyes: Negative for photophobia and discharge.   Respiratory: Negative for cough, shortness of breath.    Cardiovascular: Negative for chest pain and leg swelling.   Gastrointestinal: Negative for nausea, vomiting,   Genitourinary: Negative for dysuria and flank pain.   Musculoskeletal: Negative for back pain, joint swelling.   Skin: Negative for rash and bruising.   Neurological: Negative for syncope, headaches.   Hematological: Negative for adenopathy. Does not bruise/bleed easily.   Psychiatric/Behavioral: Negative for hallucinations and self-injury.       Labs:  Lab Results   Component Value Date    GLUCOSE 150 (H) 06/04/2016    BUN 10 10/28/2016    CO2 28 10/28/2016    CREATININE 0.66 10/28/2016    K 3.6 10/28/2016    NA 141 10/28/2016    CL 104 10/28/2016    CALCIUM 9.2 10/28/2016     No results found for: MG  No results found for: PHOS  Lab Results   Component Value Date    WBC 11.2 (H) 10/28/2016    HGB 12.8 10/28/2016    HCT 38.5 10/28/2016    MCV 87.7 10/28/2016    PLT 331 10/28/2016     No results found for: PTT, INR    Vitals:  There were no vitals taken for this visit.  Physical Exam  Constitutional: Appears stated age. No acute distress.   Head: Normocephalic and atraumatic.   Eyes: Conjunctivae and EOM are normal.   Ears: Normal hearing and external ear.  Neck: Supple. No tracheal deviation.       Imaging:  No results found.    Assessment/Plan:  Emily Bonilla is a 57 y.o. female with mast cell degranulation disorder with multiple other comorbidities presents to request an opinion for treatment for her remaining dentition.    Plan:  Discuss options with [facility] Dentists to see if they can do anything for the patient  - If patient elects for full mouth extractions we will need to coordinate her care with Rhuematolgy/allergy, anesthesia [facility] and pain service    Mason Jim, DMD  1:30 PM  03/03/2017

## 2017-03-08 ENCOUNTER — Ambulatory Visit: Admit: 2017-03-08 | Payer: PRIVATE HEALTH INSURANCE | Attending: Neurology

## 2017-03-08 DIAGNOSIS — G43719 Chronic migraine without aura, intractable, without status migrainosus: Secondary | ICD-10-CM

## 2017-03-08 MED ORDER — botulinum toxin A (BOTOX) injection 200 Units
100 | Freq: Once | INTRAMUSCULAR | Status: AC
Start: 2017-03-08 — End: 2017-03-08
  Administered 2017-03-08: 16:00:00 200 [IU] via INTRAMUSCULAR

## 2017-03-08 NOTE — Unmapped (Signed)
Consent and timeout completed     Botox injection prepared for RN per [employer]'s reconstitution instructions     Botox 100 units reconstitute with 2.0 mL of 0.9% sodium chloride   Resulting in 5.0 units     Botox 200 units reconstitute with 4.0 mL of 0.9% sodium chloride   Resulting in 5.0 units     When drawing up medication do not invert Botox vial; mix by gently rotating the vial   Do not use the Botox vial if the vacuum does not pull the saline into the vial   Do not invert the Botox vial; Leave the 2-inch needle in the bottle to draw up medication     RTC in 3 mths

## 2017-03-08 NOTE — Unmapped (Signed)
BOTOX PREEMPT PROTOCOL     Total headache days per month: 30.  Total days headache free per month: 0  Severity of headaches: moderate  Botox effective: Yes    The risks, benefits and anticipated outcomes of the procedure, the risks and benefits of the alternatives to the procedure, and the roles and tasks of the personnel to be involved, were discussed with the patient. The patient has given written informed consent to the procedure and agrees to proceed.Yes    Time out done with patient and RN/MA in the room. Patient identified by name and DOB.    Treatment # 1   Dr. Leonard Downing has done many injections before up to 400 UNITS especially in the back.  I am only doing the BOTOX protocol for chronic migraine intractable.  BOTOX brought in by patient? No  Lot #: Exp:    Dilution: 5 units/0.1 ml ( 100 unit vial with 2 cc diluent or 200 unit vial with 4 cc diluent)  Diluent: normal saline  Indication: Chronic Migraine Intractable    Injection Sites   Muscle    Fixed Site/Fixed Dose   L     R Optional follow pain # units     Corrugator  10 Units divided in 2 sites        Procerus   5 Units in 1 site          Frontalis   20 Units divided in 4 sites         Temporalis   40 Units divided in 8 sites         Occipitalis   30 Units divided in 6 sites         Cervical PSPs   20 Units divided in 4 sites        Trapezius   30 Units divided in 6 sites  22.5 units extra left trapezius in 5 divided doses                                                                                                  22.5 units extra right trapezius in 5 divided doses                                      Dry needling done across bilateral cervical paraspinal muscles where she said she had injections of 12 units each  I told her I would only do the BOTOX protocol approved across the [place] for chronic migraine headache    Other sites:  SCM              Rhomboids              Other                       _________     Subtotals        155 Units plus 45  units EXTRA as above     Total Units used:200   Total Units wasted:0  Patient did tolerate procedure.     Change in Treatment Plan?no    RTC: 3 Months

## 2017-03-10 ENCOUNTER — Ambulatory Visit: Admit: 2017-03-10 | Payer: PRIVATE HEALTH INSURANCE

## 2017-03-10 DIAGNOSIS — D849 Immunodeficiency, unspecified: Secondary | ICD-10-CM

## 2017-03-10 NOTE — Unmapped (Signed)
ALLERGY & IMMUNOLOGY Clinic Note     CC: Follow up with allergy clinic. Last seen 11/04/2016.    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     Reported mast cell disorder   Previous review of her medical record and tryptase levels do not support this diagnosis. Tryptase 03/2016 was 5.5 and 04/2016 was 6. Her symptoms previously included hives, flushing, itching, diarrhea, and hypotension during episodes, ever since she was a teenager. She also has history of dermatographia. She reports having a lot of anxiety and stress which trigger a lot of her symptoms. She is currently taking Doxepin and Hydroxyzine and feels that her symptoms of hives, flushing and itching are better controlled.    Of note, last fall she was evaluated with an EEG and EMU by neurology and was diagnosed with psychogenic nonepileptic seizures. She seems to be receptive of this diagnosis.     She endorses continued fatigue, but states she has significant improvement in diarrhea, skin sympoms (flushing and urticaria), and anxiety. She still experiences occasional urticaria on her left leg where she had prior surgery and endorses occasional urticaria inside her bottom lip. She has 1 carbuncle now from an old spider bite, located on her L inner thigh. She noticed this 5 days ago. Overall, she feels that her symptoms are much improved and states that her fatigue is her biggest concern.    She has gained 45 pounds since last visit. She has been watching what she eats, does not eat junk food or sweets and has been watching her portions. Her last PCP visit was 4-5 months ago. He attributed weight gain to fatigue and decreased activity level. She takes synthroid 112 mcg daily (dose has not changed recently) and last TSH was 10/28/2016, which was  2.29 (nl).    Asthma  She reports no wheezing. She is currently not using any inhalers and she reports that her asthma is well controlled. She states this is the best she has been able to breathe in 20 years. Peak flow today was 340 L/min.    Oral Surgery  She is having oral surgery work up tomorrow with anesthesia and surgeons. She has extensive problem with her teeth, has had multiple broken teeth with several reactions during fillings and dental procedures. A local oral surgeon is on board for the procedure who understands her prior reactions to lidocaine. She had previously been on a benadryl drip during prior procedures.     Chronic Migraine  She receives Botox injection every three months for chronic migraine. She has experienced chronic migraines for 40 years. She was seen by pain management for 7 years and tried many preventative therapies. She receives significant improvement with Botox and feels well controlled.       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 She was previously on IVIG before moving to [place] from [place]. Her IVIG was stopped in 2013, after which she started subcutaneous Ig until last year. Her Hizentra regimen was 6g weekly. The IVIG caused her to have a reaction consisting of rash and fatigue, which she did not experience with Hizentra. Recent immunologic workup showed decreased IgG level and poor response to pneumovax. She has received Pneumovax in the past, but not in the  past year. She has had prior urticarial reactions to vaccines, including Pneumovax, and no longer receives them. She had an appropriate response to tetanus vaccine (0.4).     Since her last visit, she has had 3 viral upper respiratory infections which she did see her PCP for, but only required symptomatic treatment and have since resolved.    11/15/2016:                Current Medications:  Current Outpatient  Prescriptions   Medication Sig   ??? busPIRone Take 2 tablets (20 mg total) by mouth 3 times a day. Indications: Generalized Anxiety Disorder   ??? cromolyn Take 5 mLs (100 mg total) by mouth 4 times daily before meals and at bedtime.   ??? diphenhydrAMINE Take 75-150 mg by mouth daily.   ??? doxepin Take 1 capsule (10 mg total) by mouth at bedtime.   ??? fexofenadine Take 1 tablet (180 mg total) by mouth daily.   ??? furosemide Take 1 tablet (20 mg total) by mouth daily.   ??? hydrOXYzine pamoate Take one capsule (25 mg) by mouth three times daily as needed for anxiety Indications: anxiety   ??? levalbuterol Inhale 3 mL (1.25 mg total) by nebulization every 6 hours as needed for Wheezing.   ??? levothyroxine Take one tablet by mouth once daily   ??? montelukast Take 1 tablet (10 mg total) by mouth at bedtime.   ??? nebulizer and compressor aerosol device Use 1 ampule as directed every 4 hours. Indications: Please dispense 1 nebulizer unit   ??? omeprazole Take 40 mg by mouth.   ??? onabotulinumtoxinA Inject into the muscle.   ??? predniSONE TAKE ONE TABLET BY MOUTH DAILY.   ??? predniSONE Take 1 tablet (5 mg total) by mouth daily. Take 3 tablets (15 mg) for 5 days, then 2 tablets (10 mg), then 1 tablet (5 mg) and then stop.   ??? ranitidine Take 1 tablet (300 mg total) by mouth 3 times a day.   ??? triamcinolone APPLY 2 TIMES DAILY     No current facility-administered medications for this visit.        Review of Systems:  GENERAL:  +weight gain  EYES: denies changes in vision, watery or itchy eyes   ENT: denies changes in hearing or dental problems   CARDIOVASCULAR: no palpitations, chest pain  LUNGS: -SOB, -cough   ABDOMEN: -diarrhea   GU: denies dysuria or hematuria  HEME/ONC: denies abnormal bleeding or bruising  SKIN: per HPI  MSK: denies muscle weakness, joint stiffness or joint pain   ENDO: denies heat or cold intolerance  NEURO: denies seizures, headaches or focal neurological deficits  PSYCH: +anxiety      Past Medical History:  Past  Medical History:   Diagnosis Date   ??? Anaphylaxis    ??? Anemia    ??? Anxiety    ??? Asthma    ??? Atrial fibrillation (CMS Dx)    ??? Bronchitis    ??? Heart disease    ??? Neurological disease    ??? OSA (obstructive sleep apnea)     states resolved   ??? Osteoporosis    ??? Psychogenic nonepileptic seizure    ??? Spontaneous pneumothorax     states pneumomediatsium/pneumopericardium, self resolved   ??? Thyroid disease    ??? Ulcer    ??? WPW (Wolff-Parkinson-White syndrome)        Past Surgical History:  Past Surgical History:   Procedure Laterality Date   ??? BONE MARROW  BIOPSY      benign   ??? CARDIAC ELECTROPHYSIOLOGY MAPPING AND ABLATION      attempted x 2, states not successful either time   ??? CHOLECYSTECTOMY     ??? FACIAL COSMETIC SURGERY     ??? HYSTERECTOMY     ??? LIVER BIOPSY      benign hepatic adednoma per patient   ??? REMOVAL HARDWARE LEG Left 08/17/2016    Procedure: REMOVAL OF HARDWARE LEFT TIBIA;  Surgeon: Andee Lineman, MD;  Location: [facility] OR;  Service: Orthopedics;  Laterality: Left;   ??? TIBIA FRACTURE SURGERY     ??? TONSILLECTOMY      + adenoids       Social History:  Tobacco use: none  Alcohol use: none    Allergies:  Allergies   Allergen Reactions   ??? Acetaminophen Anaphylaxis     Unknown   ??? Aspirin Anaphylaxis     Unknown   ??? Blue Dye Anaphylaxis     Unknown   ??? Epinephrine Anaphylaxis   ??? Flecainide Shortness Of Breath     Itching, decreased libido, wheezing, flushing, numbness.    ??? Gabapentin Anaphylaxis   ??? Iodinated Contrast- Oral And Iv Dye Anaphylaxis   ??? Isopropyl Alcohol Anaphylaxis   ??? Latex Anaphylaxis and Rash   ??? Lidocaine Anaphylaxis   ??? Nsaids (Non-Steroidal Anti-Inflammatory Drug) Anaphylaxis   ??? Onion Anaphylaxis   ??? 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  HAS MAST CELL ACTIVATION SYNDROME, SO PATIENT HAS ANAPHYLACTIC REACTIONS IF BODY RECOGNIZES TRIGGERS  CANNOT  TOLERATE ANY DYES, PRESERVATIVES, PARABENS, ETC.   ??? Perfume Anaphylaxis   ??? Pregabalin Anaphylaxis   ??? Quetiapine Anaphylaxis     Mental Status Change   ??? Red Dye Anaphylaxis     Unknown   ??? Sulfur Anaphylaxis     Pt states: Any alcohol product   ??? Yellow Dye Anaphylaxis     Unknown   ??? Amitriptyline    ??? Doxepin Other (See Comments)     Unknown   ??? Duloxetine      Other reaction(s): Other (See Comments)  Mental Status Change   ??? Epi E-Z Pen    ??? Hydroxyzine Itching     Unknown   ??? Levofloxacin Itching   ??? Montelukast    ??? Verapamil (Bulk) Hives     Insomnia, flushing, scattered brain, migraine, itching, anxiety.    ??? Adhesive Tape-Silicones Itching, Rash and Swelling     Unknown   ??? Trazodone Rash       Physical Exam:   There were no vitals taken for this visit.  GENERAL APPEARANCE: Well developed, well nourished, in no acute distress, alert and oriented.  SKIN: Inspection of the skin reveals no rashes, ulcerations or petechia. Healing scar on L lower leg from titanium removal.  HEENT: Normal turbinates, no evidence of swelling or rhinorrhea. No masses, polyps or pus. No septal perforation.   NECK: Supple and symmetric.  LUNGS: Auscultation of the lungs revealed normal breath sounds, no wheezes or crackles.  CARDIOVASCULAR: Regular rate and rhythm without any murmurs, rubs or gallops.  ABDOMEN: Soft and nontender with normal bowel sounds.  LYMPH NODES:  No lymphadenopathy.  EXTREMITIES:  No cyanosis, clubbing or edema.  NEUROLOGIC: No focal neurological deficits noted.    Assessment and Plan: Emily Bonilla is a 57 y.o. female with a  complicated PMHx significant for asthma and idiopathic mast cell activation who presents to the allergy clinic today for a f/u visit.     1. Reported history of mast cell activation  - Currently controlled.  - Continue Doxepin and Hydroxyzine.    2. Asthma  - Currently controlled, not using any medications or inhalers.    3. 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   - If symptoms begin to worsen, we will consider further work up with repeat pneumococcal vaccination and post-vaccination titers.    4. Oral Surgery  - Treat with standard pre-medication regimen for patients with mast cell activation prior to antigen exposure:   - Prednisone 50 mg PO - 13 hours prior to surgery, 7 hours prior and 1 hour prior to surgery.   - Diphenhydramine 50 mg PO - 1 hour prior to procedure.     5. Chronic fatigue  - This could be likely in the setting of her anti-histamine use  - Encouraged continuing to monitor diet and continuing physical activity.  - Encouraged to follow-up with PCP for repeat thyroid testing.      Follow up: 6 months     Rozann Lesches, MS3    Matilde Sprang, D.O.  Allergy and Immunology Fellow    Discussed with the allergy attending Dr. Harley Hallmark who is in agreement with the above plan.

## 2017-03-10 NOTE — Unmapped (Signed)
I saw and examined the patient.  I discussed with the resident or fellow and agree with Christena Flake, DO's findings and plan as documented in the note.    HPI: Pt is a 57 y/o female with  h/o asthma, ?Mast cell activation syndrome (dx at [facility]), anxiety, arthralgias/myalgias, focal seizures, carries a dx of CVID with prior IVIG therapy last several years ago.  Last seen 10/18 at which time she was tapered off prednisone.  Has been taking hydroxyzine 25g TID and doxepin 10mg   Qhs.  Feeling much better from an anxiety standpoint and mast cell standpoint.  No ER visits related to mast cell issues since last visit.  Has upcoming dental procedure and wants to discuss pretreatment options today.    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     H/o CVID with prior IVIG use: denies any major issues since last visit.  Had a few URIS but no abx or hospitalizations related to infections.  Her IgG level was slightly low at 665 but she had normal IgG in 4/18 and her IgA and IgM were both normal.  Low Pneumococcal titers at last visit.  She reports h/o reacting to pneumococcal vaccine in past but did not provide details of reaction today.   ??  Physical Exam:  Well appearing female   No rashes or hives   No nasal discharge  Lungs clear without wheezing  No LE edema  ??  A&P:  1. H/o Mast cell activation syndrome - patient still has not brought records into for our review; although report from prior visits states that she has not had e/o elevated mast cell mediators in past:  - cont Hydroxyzine 25mg  TID prn and doxepin 10mg  qhs  - she has not needed any prednisone or ER visits since last visit  - for her upcoming dental procedure - recommend the typical pretreatment with prednisone/bendaryl 13hrs, 7 hrs and 1 hr prior to surgery and avoid medications that cause direct mast cell degranulation (opioids, neuromuscular blocking agents, NSAIDs, RCM)    ??  2. H/o CVID on prior IVIG,  again need to review prior records to review/clarify diagnosis:  - currently labs are not concerning for CVID - had an isolated mildly decreased IgG with normal IgA and IgM, tetanus titers good, pneumococcal titers low  - she reports h/o rxn to pneumovax so WILL NOT check post-vaccine titers now given her lack of clinical symptoms of immunodeficiency  - if she develops more severe infections or clinical scenario changes - will re-address pneumococcal vaccine rxn and consider giving at that time to assess post-vaccine titers, depending on rxn severity    Lynise Porr Alinda Dooms, MD

## 2017-03-10 NOTE — Unmapped (Signed)
Addended by: Hayden Pedro A on: 03/10/2017 01:30 PM     Modules accepted: Level of Service

## 2017-03-11 MED ORDER — montelukast (SINGULAIR) 10 mg tablet
10 | ORAL_TABLET | ORAL | 0 refills | Status: AC
Start: 2017-03-11 — End: 2017-05-07

## 2017-03-14 ENCOUNTER — Ambulatory Visit: Admit: 2017-03-14 | Discharge: 2017-03-14 | Payer: PRIVATE HEALTH INSURANCE

## 2017-03-14 DIAGNOSIS — F4323 Adjustment disorder with mixed anxiety and depressed mood: Secondary | ICD-10-CM

## 2017-03-14 NOTE — Unmapped (Signed)
In: ??11am  OUt: ??1155am  ??  Chief Issue: stress/anxiety about health??  ??  Subjective:   Pt discussed additional symptoms that she has from her disordered health. ??Discussed weather changes and how that affects her system.  Came without her aid today - off for personal reasons.  discussed that she likes this person and hoping that she can take her out to more places.  Has been trying to stay positive and find more ways to interact with others - went through short list of activities that she is looking forward to in the spring.  Discussed ex-husband and his behavior at the end of their marriage, which was very unlike him and disappointing to her.  Discussed friends that she talks with on the phone sometimes - one in particular that has the same disorder and how this person is not functioning well and how it affects patient.  Has been cooking a lot when aid home with her.  Will have teeth worked on very soon - having surgery (needed to be fixed from fall she had a while ago).   ??Tries to find a way to stay optimistic and positive and not give in to the illness. ??Gets very sad and down when she thinks about what used to be too much. Discussed some additional relaxation exercises that she could employ.????  ??  Objective:   Very optimistic and positive on the outside especially with what she is managing health and financially.  MSE  Motor Behavior:??no psychomotor abnormalities  ??  Cognition: alert  ??  Attitude: cooperative  ??  Affect: sad, tearful, appropriate to thought and anxious  ??  Appearance: appropriately dressed  ??  Speech: normal rate  ??  Mood:??depressed, consistent and anxious  ??  Thought Processes:??well organized and no looseness of association??  ??  Perceptions:??no hallucinations or other abnormalities????  ??  Thought content:??no delusions, low self-esteem and demoralized  ??  Insight/ judgement:??no impairments  ??  Suicidal ideation:??none  ??  Homicidal ideation:??none  ??  Symptoms observed during  session:  Agitation,??anhedonia,??anxiety/fear,????decreased energy/fatigue, mildly??depressed,??emotional lability, sleep disturbance, restlessness,??sad/pained/worried expression,Open coachable motivated  ??  ??  Assessment:   ??  Intervention strategies implemented and session focus was cognitive behavioral,??insight-oriented,??behavior modification,??supportive,??biofeedback,??mindfulness strategies  ??  Disc doing some additional practice of visualization/guided/hypnotherapy type practice to find a way to help her relax. ??Stays tense constantly.  ??  Plan:   ??  Homework:   Practice breathing techniques  Continuing physical exercise  Follow bedtime routine with consistency  ??  Future treatment/follow-up issues:  ??  Two weeks

## 2017-03-21 ENCOUNTER — Ambulatory Visit: Admit: 2017-03-21 | Discharge: 2017-03-21 | Payer: PRIVATE HEALTH INSURANCE

## 2017-03-21 DIAGNOSIS — F4323 Adjustment disorder with mixed anxiety and depressed mood: Secondary | ICD-10-CM

## 2017-03-21 NOTE — Unmapped (Signed)
In: ??11am  OUt: ??1155am  ??  Chief Issue: stress/anxiety about health??  ??  Subjective:   Pt discussed additional symptoms that she has from her disordered health. ??Discussed weather changes and how that affects her system.   Discussed several stressors that has to deal with when aids help her.  Need them but they are usually lacking.  The current one continues to be on her phone while she's at patient's house.  Tries to continue to gently teach her that she shouldn't be, but this is causing her additional stress.  Discussed upcoming dental surgeries that she is going to have.  Discussed that one of the practitioners that will be doing her dental work doesn't seem to understand how her body may potentially react to being worked on.  Continues to try to be optimistic, patient, and just control the controllables.    ??  Objective:   Very optimistic and positive on the outside especially with what she is managing health and financially.  MSE  Motor Behavior:??no psychomotor abnormalities  ??  Cognition: alert  ??  Attitude: cooperative  ??  Affect: sad, tearful, appropriate to thought and anxious  ??  Appearance: appropriately dressed  ??  Speech: normal rate  ??  Mood:??depressed, consistent and anxious  ??  Thought Processes:??well organized and no looseness of association??  ??  Perceptions:??no hallucinations or other abnormalities????  ??  Thought content:??no delusions, low self-esteem and demoralized  ??  Insight/ judgement:??no impairments  ??  Suicidal ideation:??none  ??  Homicidal ideation:??none  ??  Symptoms observed during session:  Agitation,??anhedonia,??anxiety/fear,????decreased energy/fatigue, mildly??depressed,??emotional lability, sleep disturbance, restlessness,??sad/pained/worried expression,Open coachable motivated  ??  ??  Assessment:   ??  Intervention strategies implemented and session focus was cognitive behavioral,??insight-oriented,??behavior modification,??supportive,??biofeedback,??mindfulness strategies  ??  Disc doing some  additional practice of visualization/guided/hypnotherapy type practice to find a way to help her relax. ??Stays tense constantly.  ??  Plan:   ??  Homework:   Practice breathing techniques  Continuing physical exercise  Follow bedtime routine with consistency  ??  Future treatment/follow-up issues:  ??  Two weeks

## 2017-03-29 ENCOUNTER — Ambulatory Visit: Admit: 2017-03-29 | Discharge: 2017-03-29 | Payer: PRIVATE HEALTH INSURANCE

## 2017-03-29 DIAGNOSIS — F4323 Adjustment disorder with mixed anxiety and depressed mood: Secondary | ICD-10-CM

## 2017-03-29 NOTE — Unmapped (Signed)
In: ??10am  OUt: ??1055am  ??  Chief Issue: stress/anxiety about health??  ??  Subjective:   Pt discussed additional symptoms that she has from her disordered health. ??Discussed weather changes and how that affects her system. ?? Discussed several stressors that has to deal with when aids help her.  Last week was especially difficult because her aid was ill, they didn't send a replacement and she had to postpone a consultation with the oral surgeon.  We discussed stress associated with the aids and how she is handling this.   Needs to continue to focus on what she can control.  Discussed several stressors from the past with husband in particular as well as the church.  Is longing for a connection with a group locally.  Wants to help others.   Trying to continue to be optimistic, hopeful.  ??  Objective:   Very optimistic and positive on the outside especially with what she is managing health and financially.  MSE  Motor Behavior:??no psychomotor abnormalities  ??  Cognition: alert  ??  Attitude: cooperative  ??  Affect: sad, tearful, appropriate to thought and anxious  ??  Appearance: appropriately dressed  ??  Speech: normal rate  ??  Mood:??depressed, consistent and anxious  ??  Thought Processes:??well organized and no looseness of association??  ??  Perceptions:??no hallucinations or other abnormalities????  ??  Thought content:??no delusions, low self-esteem and demoralized  ??  Insight/ judgement:??no impairments  ??  Suicidal ideation:??none  ??  Homicidal ideation:??none  ??  Symptoms observed during session:  Agitation,??anhedonia,??anxiety/fear,????decreased energy/fatigue, mildly??depressed,??emotional lability, sleep disturbance, restlessness,??sad/pained/worried expression,Open coachable motivated  ??  ??  Assessment:   ??  Intervention strategies implemented and session focus was cognitive behavioral,??insight-oriented,??behavior modification,??supportive,??biofeedback,??mindfulness strategies  ??  Disc doing some additional practice of  visualization/guided/hypnotherapy type practice to find a way to help her relax. ??Stays tense constantly.  ??  Plan:   ??  Homework:   Practice breathing techniques  Continuing physical exercise  Follow bedtime routine with consistency  Continue to seek out groups that she can potentially get involved in  ??  Future treatment/follow-up issues:  ??three  weeks

## 2017-04-04 ENCOUNTER — Ambulatory Visit: Admit: 2017-04-04 | Discharge: 2017-04-04 | Payer: PRIVATE HEALTH INSURANCE

## 2017-04-04 DIAGNOSIS — F4323 Adjustment disorder with mixed anxiety and depressed mood: Secondary | ICD-10-CM

## 2017-04-04 NOTE — Unmapped (Signed)
In: ??10am  OUt: ??1055am  ??  Chief Issue: stress/anxiety about health??  ??  Subjective:   Pt discussed additional symptoms that she has from her disordered health. ??Discussed weather changes and how that affects her system. ????Discussed several stressors that has to deal with when aids help her - wrote out a bulleted list of what she needs.  Thankful that aid shows up but has been very stressful with her behavior and doesn't seem to understand what patient needs for her health.  Went through relaxation exercise today (historic landmark with passive muscle relaxation).  Stated that felt much more relaxed while in exercise.  Went deeper than previous attempt.  Having pain on one point on the top of her head where she thinks the physician missed an area for botox.  Also, had teeth worked on and is feeling more hopeful from this.  ??Discussed seeking a consultation with Dr. Valeda Malm for additional information with histamine response.    Wants to help others.   Trying to continue to be optimistic, hopeful.  ??  Objective:   Very optimistic and positive on the outside especially with what she is managing health and financially.  MSE  Motor Behavior:??no psychomotor abnormalities  ??  Cognition: alert  ??  Attitude: cooperative  ??  Affect: sad, tearful, appropriate to thought and anxious  ??  Appearance: appropriately dressed  ??  Speech: normal rate  ??  Mood:??depressed, consistent and anxious  ??  Thought Processes:??well organized and no looseness of association??  ??  Perceptions:??no hallucinations or other abnormalities????  ??  Thought content:??no delusions, low self-esteem and demoralized  ??  Insight/ judgement:??no impairments  ??  Suicidal ideation:??none  ??  Homicidal ideation:??none  ??  Symptoms observed during session:  Agitation,??anhedonia,??anxiety/fear,????decreased energy/fatigue, mildly??depressed,??emotional lability, sleep disturbance, restlessness,??sad/pained/worried expression,Open coachable motivated  ??  ??  Assessment:    ??  Intervention strategies implemented and session focus was cognitive behavioral,??insight-oriented,??behavior modification,??supportive,??biofeedback,??mindfulness strategies  ??  Guided imagery exercise (Historic landmark)  ??  Plan:   ??  Homework:   Practice breathing techniques  Continuing physical exercise  Follow bedtime routine with consistency  Continue to seek out groups that she can potentially get involved in  ??  Future treatment/follow-up issues:  ??two weeks.  Also, refer to Dr. Valeda Malm

## 2017-04-11 MED ORDER — furosemide (LASIX) 20 MG tablet
20 | ORAL_TABLET | Freq: Every day | ORAL | 3 refills | Status: AC
Start: 2017-04-11 — End: 2018-03-27

## 2017-04-11 NOTE — Unmapped (Signed)
Requested Prescriptions     Pending Prescriptions Disp Refills   ??? furosemide (LASIX) 20 MG tablet [Pharmacy Med Name: FUROSEMIDE 20 MG TAB] 90 tablet 3     Sig: Take 1 tablet (20 mg total) by mouth daily.

## 2017-04-14 ENCOUNTER — Ambulatory Visit: Admit: 2017-04-14 | Payer: PRIVATE HEALTH INSURANCE | Attending: Psychiatry

## 2017-04-14 DIAGNOSIS — F419 Anxiety disorder, unspecified: Secondary | ICD-10-CM

## 2017-04-14 MED ORDER — hydrOXYzine HCl (ATARAX) 25 MG tablet
25 | ORAL_TABLET | Freq: Three times a day (TID) | ORAL | 2 refills | Status: AC | PRN
Start: 2017-04-14 — End: 2017-07-07

## 2017-04-14 MED ORDER — busPIRone (BUSPAR) 10 MG tablet
10 | ORAL_TABLET | Freq: Three times a day (TID) | ORAL | 2 refills | Status: AC
Start: 2017-04-14 — End: 2017-08-06

## 2017-04-14 NOTE — Unmapped (Signed)
Please take your medication as prescribed and follow-up in 10 weeks    If you develop any medication side effects or have any concerns about your mental health or your medication, please call [phone].    If feeling suicidal, call the suicide hotline at 513-281-CARE (2273), 911, or return to the emergency room.     SUICIDE PREVENTION RESOURCES:   [facility]:  513-281-CARE [phone]) hotline      [facility]: [phone]      Mobile Crisis Team [phone]   [facility]:  [facility] Consultation & Crisis: [phone]   [facility]: Emergency Crisis Hotline: 513-528-SAVE (317)639-1846)   [place]: [facility] Emergency Crisis Line: 859-718-8709   National:  National Hotline: 1-800-273-TALK (8255)      Www.suicidepreventionlifeline.orgg  ------------------------------------------------------------------------------------------------------------------  Discharge Instructions After an Episode of Suicide Threats or Actions   Remove all fireams, weapons (of any kind) or any unneeded medicines that could be used   Take person to follow-up mental health appointments   Have a loved one sign a release of information allowing you to contact the mental health provider   Be direct and talk openly about suicidal thoughts   Be willing to listen. Allow expressions of feelings   Block all inappropriate internet websites and social media   Offer to be available to them at any time   Be available to accompany them to the nearest emergency department   Call other family members or friends to help and offer support       Get help from agencies that specialize in crisis intervention   Using the information here and other community resources, create a suicide safety plan. Example: call support networks, crisis lines, 911, go to emergency room, call Mobile Crisis Team, call mental health provider    Warning Signs:   Severe depression with restlessness   Inability to sleep or sleeping too much   Withdrawing  from friends, family and life   Increase alcohol and/or drug use   Giving away personal items   Anxiety, agitation, rage, or anger   Unpredictable mood swings   Feelings like there is no way out   Hopelessness   Constantly talking or writing about death   Seeking out information about suicide    What Family Members & Friends Should Know:   Suicidal ideas are usually associated with treatable conditions   People who try or actually commit suicide try to let someone know by leaving a note   All suicide threats should be taken seriously   Suicidal ideas occur when people are depressed, intoxicated or irrational   Suicidal thinking can consume a person   Suicide risk increases when a person who has been severely depressed suddenly has more energy   Most common ways of suicide - pills, guns, poisons, hanging, carbon monoxide breathing, jumping off high places, and accidents    How Can I Help When Someone is Threatening Suicide?   Take Action!    1. Take his/her words seriously and respond with compassion    2. Do not leave them alone    3. Call 911 and have person taken to emergency room    4. Accompany the person to the emergency room and provide the physician with information

## 2017-04-14 NOTE — Unmapped (Signed)
Visit Start Time: 2:45 PM   Visit Quit Time: 3:30 PM    Chief Complaint   Patient presents with   ??? Anxiety     Context:??preoccupation with somatic symptoms  Location: Altered mental status of anxiety  Duration: chronic  Severity:??mild  Associated Symptoms: anxiety  Modifying Factors: challenging the belief that she has mast cell activation syndrome        History of Present Illness:   Emily Bonilla is a 57 y.o. female with PMH unspecified anxiety disorder, atrial fibrillation, WPW, idiopathic mast cell activation, anemia, asthma, hypothyroidism, GERD, intermittent chronic HA, PNES, presents for follow-up.    Last seen 02/03/17. Current psychiatric medications: Buspirone 20 mg TID, Hydroxyzine 25 mg TID PRN    Per recent Allergy/Immunology Clinic note 03/10/17, review of her records and tryptase levels do not support a diagnosis of mast cell activation disorder     HPI      Doing pretty good. Denies sad mood, amotivation, anhedonia. Chronic low energy. Good appetite. Sleeping well. Denies SI. Denies anxiety, worrying or feeling tense but does feel frustrated at times. Denies panic attacks. Doing therapy at [facility] weekly and has learned additional techniques for meditation. Helpful for anxiety. Doing meditation scheduled in the mornings and evening. Also does yoga. Spends her time during the day cooking with her home health aid. Says she cannot cook or bake by herself because she will get brain fog due to her mast cell disorder. Continues to be very focused on her health and medical symptoms. Talks about how she will be getting IV Benadryl and Solumedrol with an upcoming dental procedure. Medications helpful. Denies any SEs.    Patient History:     Psychiatric History:     Prior Inpatient Psychiatric Hospitalization(s): denies  Prior Psychotropic Medications: reports taking prior psychotropic medications.  Current Psychotropic Medications: yes - see below current psychotropic medications.  SA:  denies    Past Medical History:   Diagnosis Date   ??? Anaphylaxis    ??? Anemia    ??? Anxiety    ??? Asthma    ??? Atrial fibrillation (CMS Dx)    ??? Bronchitis    ??? Heart disease    ??? Neurological disease    ??? OSA (obstructive sleep apnea)     states resolved   ??? Osteoporosis    ??? Psychogenic nonepileptic seizure    ??? Spontaneous pneumothorax     states pneumomediatsium/pneumopericardium, self resolved   ??? Thyroid disease    ??? Ulcer    ??? WPW (Wolff-Parkinson-White syndrome)        Past Surgical History:   Procedure Laterality Date   ??? BONE MARROW BIOPSY      benign   ??? CARDIAC ELECTROPHYSIOLOGY MAPPING AND ABLATION      attempted x 2, states not successful either time   ??? CHOLECYSTECTOMY     ??? FACIAL COSMETIC SURGERY     ??? HYSTERECTOMY     ??? LIVER BIOPSY      benign hepatic adednoma per patient   ??? REMOVAL HARDWARE LEG Left 08/17/2016    Procedure: REMOVAL OF HARDWARE LEFT TIBIA;  Surgeon: Andee Lineman, MD;  Location: [facility] OR;  Service: Orthopedics;  Laterality: Left;   ??? TIBIA FRACTURE SURGERY     ??? TONSILLECTOMY      + adenoids       Social History     Social History   ??? Marital status: Divorced     Spouse name: N/A   ???  Number of children: N/A   ??? Years of education: N/A     Social History Main Topics   ??? Smoking status: Never Smoker   ??? Smokeless tobacco: Never Used      Comment: Denies (11/15/2016)   ??? Alcohol use No      Comment: Denies (11/15/2016)   ??? Drug use: No      Comment: Denies (11/15/2016)   ??? Sexual activity: Not Currently     Other Topics Concern   ??? Caffeine Use Yes   ??? Exercise No   ??? Seat Belt Yes     Social History Narrative   ??? None       Family History   Problem Relation Age of Onset   ??? Kidney failure Mother    ??? Aneurysm Mother        Review of Systems  +HAs, +itching, +joint pain, +abdominal pain, +intermittent diarrhea, +weight gain  Denies CP, SOB, nausea, vomiting    Allergies:   Acetaminophen; Aspirin; Blue dye; Epinephrine; Flecainide; Gabapentin; Iodinated contrast- oral and iv dye;  Isopropyl alcohol; Latex; Lidocaine; Nsaids (non-steroidal anti-inflammatory drug); Onion; Other; Perfume; Pregabalin; Quetiapine; Red dye; Sulfur; Yellow dye; Amitriptyline; Doxepin; Duloxetine; Epi e-z pen; Hydroxyzine; Levofloxacin; Montelukast; Verapamil (bulk); Adhesive tape-silicones; and Trazodone    Medications:     Outpatient Encounter Prescriptions as of 04/14/2017   Medication Sig Dispense Refill   ??? busPIRone (BUSPAR) 10 MG tablet Take 2 tablets (20 mg total) by mouth 3 times a day. Indications: Generalized Anxiety Disorder 180 tablet 2   ??? cromolyn (GASTROCROM) 100 mg/5 mL solution Take 5 mLs (100 mg total) by mouth 4 times daily before meals and at bedtime. 480 mL 11   ??? diphenhydrAMINE (BENADRYL) 25 mg capsule Take 75-150 mg by mouth daily.     ??? doxepin (SINEQUAN) 10 MG capsule Take 1 capsule (10 mg total) by mouth at bedtime. 90 capsule 3   ??? fexofenadine (ALLEGRA) 180 MG tablet Take 1 tablet (180 mg total) by mouth daily. 30 tablet 3   ??? furosemide (LASIX) 20 MG tablet Take 1 tablet (20 mg total) by mouth daily. 90 tablet 3   ??? levalbuterol (XOPENEX) 1.25 mg/3 mL nebulizer solution Inhale 3 mL (1.25 mg total) by nebulization every 6 hours as needed for Wheezing. 72 mL 0   ??? levothyroxine (SYNTHROID, LEVOTHROID) 112 MCG tablet Take one tablet by mouth once daily 90 tablet 3   ??? montelukast (SINGULAIR) 10 mg tablet TAKE ONE TABLET BY MOUTH AT BEDTIME. 30 tablet 0   ??? nebulizer and compressor Devi Use 1 ampule as directed every 4 hours. Indications: Please dispense 1 nebulizer unit 1 each 0   ??? omeprazole (PRILOSEC) 40 MG capsule Take 40 mg by mouth.     ??? onabotulinumtoxinA (BOTOX COSMETIC) 50 unit SolR Inject into the muscle.     ??? predniSONE (DELTASONE) 20 MG tablet TAKE ONE TABLET BY MOUTH DAILY. 30 tablet 0   ??? predniSONE (DELTASONE) 5 MG tablet Take 1 tablet (5 mg total) by mouth daily. Take 3 tablets (15 mg) for 5 days, then 2 tablets (10 mg), then 1 tablet (5 mg) and then stop. 30 tablet 0   ???  ranitidine (ZANTAC) 300 MG tablet Take 1 tablet (300 mg total) by mouth 3 times a day. 90 tablet 11   ??? triamcinolone (KENALOG) 0.1 % ointment APPLY 2 TIMES DAILY 80 g 11   ??? [DISCONTINUED] busPIRone (BUSPAR) 10 MG tablet Take 2 tablets (20 mg  total) by mouth 3 times a day. Indications: Generalized Anxiety Disorder 180 tablet 3   ??? [DISCONTINUED] hydrOXYzine pamoate (VISTARIL) 25 MG capsule Take one capsule (25 mg) by mouth three times daily as needed for anxiety Indications: anxiety 90 capsule 3   ??? hydrOXYzine HCl (ATARAX) 25 MG tablet Take 1 tablet (25 mg total) by mouth 3 times a day as needed for Anxiety. Indications: anxiety 90 tablet 2     No facility-administered encounter medications on file as of 04/14/2017.         Outpatient Medications Prior to Visit   Medication Sig Dispense Refill   ??? cromolyn (GASTROCROM) 100 mg/5 mL solution Take 5 mLs (100 mg total) by mouth 4 times daily before meals and at bedtime. 480 mL 11   ??? diphenhydrAMINE (BENADRYL) 25 mg capsule Take 75-150 mg by mouth daily.     ??? doxepin (SINEQUAN) 10 MG capsule Take 1 capsule (10 mg total) by mouth at bedtime. 90 capsule 3   ??? fexofenadine (ALLEGRA) 180 MG tablet Take 1 tablet (180 mg total) by mouth daily. 30 tablet 3   ??? furosemide (LASIX) 20 MG tablet Take 1 tablet (20 mg total) by mouth daily. 90 tablet 3   ??? levalbuterol (XOPENEX) 1.25 mg/3 mL nebulizer solution Inhale 3 mL (1.25 mg total) by nebulization every 6 hours as needed for Wheezing. 72 mL 0   ??? levothyroxine (SYNTHROID, LEVOTHROID) 112 MCG tablet Take one tablet by mouth once daily 90 tablet 3   ??? montelukast (SINGULAIR) 10 mg tablet TAKE ONE TABLET BY MOUTH AT BEDTIME. 30 tablet 0   ??? nebulizer and compressor Devi Use 1 ampule as directed every 4 hours. Indications: Please dispense 1 nebulizer unit 1 each 0   ??? omeprazole (PRILOSEC) 40 MG capsule Take 40 mg by mouth.     ??? onabotulinumtoxinA (BOTOX COSMETIC) 50 unit SolR Inject into the muscle.     ??? predniSONE  (DELTASONE) 20 MG tablet TAKE ONE TABLET BY MOUTH DAILY. 30 tablet 0   ??? predniSONE (DELTASONE) 5 MG tablet Take 1 tablet (5 mg total) by mouth daily. Take 3 tablets (15 mg) for 5 days, then 2 tablets (10 mg), then 1 tablet (5 mg) and then stop. 30 tablet 0   ??? ranitidine (ZANTAC) 300 MG tablet Take 1 tablet (300 mg total) by mouth 3 times a day. 90 tablet 11   ??? triamcinolone (KENALOG) 0.1 % ointment APPLY 2 TIMES DAILY 80 g 11   ??? busPIRone (BUSPAR) 10 MG tablet Take 2 tablets (20 mg total) by mouth 3 times a day. Indications: Generalized Anxiety Disorder 180 tablet 3   ??? hydrOXYzine pamoate (VISTARIL) 25 MG capsule Take one capsule (25 mg) by mouth three times daily as needed for anxiety Indications: anxiety 90 capsule 3     No facility-administered medications prior to visit.        Objective:   Physical Exam  Vitals:    04/14/17 1503   BP: 133/73   Pulse: 85   Temp: 98.3 ??F (36.8 ??C)       Gait and Stations: Normal    Mental Status Exam:   Appearance: middle-aged Caucasian female, appears stated age, good hygiene and grooming, fair eye contact, cooperative  Activity: no PMR or PMA, no abnormal movements noted  Speech: hyper-verbal and mildly pressured but easily able to be interrupted, at times loud,??normal latency of response, fluent with no aphasia  Mood/Affect: good affect congruent with stated mood, full range,  euthymic  Thought Process: organized, linear, goal-directed, no LOA or FOI  Thought Content: talks about feeling well but is very focused on her health symptoms, denies SI, does not express HI, no delusions expressed  Perceptions: does not appear to be internally preoccupied or responding to hallucinations  Attention: fair to conversation  Fund of Knowledge:average  Memory:appropriate to conversation  Insight/Judgement: fair/fair    PHQ-9 Scores:   PHQ Total Score 03/30/2016 05/21/2016 06/24/2016 08/26/2016   PHQ-9 Total Score 0 0 0 9       GAD7 Scores:   GAD7Total Score 08/26/2016 11/25/2016 02/03/2017  04/14/2017   GAD-7 Total Score 10 6 2 6           Assessment/Plan:     Diagnoses:  -Somatic symptom disorder  -Unspecified anxiety disorder, mild  -Cluster B personality traits (particularly histrionic)  -R/O Illness anxiety disorder  -R/O Factitious disorder    Her anxiety has significantly improved on the current medication regimen and with treatment through integrative medicine. She continues to be preoccupied with numerous medical symptoms and her illness preoccupation and anxiety appears to be an exaggerated response to objective medical findings (example - per recent Allergy/Immunology clinic note 03/10/17 review of her records and tryptase levels do not support a diagnosis of mast cell activation disorder). Since she reported doing very well today, I decided not to bring up the discrepancy between her reported mast cell activation disorder and findings not consistent with this diagnosis. Will plan to bring this up with her at the next visit and explore what is underlying her preoccupation with this medical condition. Will continue current medication regimen given beneficial effects.      # Anxiety, mild, significantly improved:  -Continue Buspirone 20 mg TID for anxiety  -Continue Hydroxyzine 25 mg TID PRN for anxiety. Encouraged to try taking medication PRN as opposed to scheduled due to risk of cognitive decline with long-term use of anti-histamine  -Continue treatment with integrative medicine for biofeedback, massage therapy and acupuncture  -Self-management: meditation, yoga  -RTC in 10 weeks  ??  Discussed risks/benefits/side effects/alternatives to medications. Discussed the risk of cognitive decline with long-term use of anti-histamine. Patient expressed understanding of and agreement to the current treatment plan.   ??  # Safety: The patient's imminent risk appears low, as she denies SI, does not express HI, no previous SA, future-oriented and goal-oriented to continue treatment. Patient expressed  understanding of resources, including 911 and PES.  ??  # Substance:  -No active issues  ??  # Medical:  -Follow-up with PCP  ??  Jonelle Sidle, MD  Psychiatry PGY-3  Pager: [phone]  ??  Patient plan discussed and agreed upon by attending Dr. Thomasena Edis

## 2017-04-18 ENCOUNTER — Ambulatory Visit: Admit: 2017-04-18 | Payer: PRIVATE HEALTH INSURANCE | Attending: Family

## 2017-04-18 DIAGNOSIS — K029 Dental caries, unspecified: Secondary | ICD-10-CM

## 2017-04-18 LAB — BASIC METABOLIC PANEL
Anion Gap: 12 mmol/L (ref 3–16)
BUN: 10 mg/dL (ref 7–25)
CO2: 27 mmol/L (ref 21–33)
Calcium: 9.3 mg/dL (ref 8.6–10.3)
Chloride: 106 mmol/L (ref 98–110)
Creatinine: 0.66 mg/dL (ref 0.60–1.30)
Glucose: 83 mg/dL (ref 70–100)
Osmolality, Calculated: 298 mosm/kg (ref 278–305)
Potassium: 3.7 mmol/L (ref 3.5–5.3)
Sodium: 145 mmol/L (ref 133–146)
eGFR AA CKD-EPI: >90 See note.
eGFR NONAA CKD-EPI: >90 See note.

## 2017-04-18 LAB — CBC
Hematocrit: 43 % (ref 35.0–45.0)
Hemoglobin: 14.3 g/dL (ref 11.7–15.5)
MCH: 27.7 pg (ref 27.0–33.0)
MCHC: 33.2 g/dL (ref 32.0–36.0)
MCV: 83.5 fL (ref 80.0–100.0)
MPV: 9.3 fL (ref 7.5–11.5)
Platelets: 289 10E3/uL (ref 140–400)
RBC: 5.15 10E6/uL — ABNORMAL HIGH (ref 3.80–5.10)
RDW: 13.8 % (ref 11.0–15.0)
WBC: 10.4 10E3/uL (ref 3.8–10.8)

## 2017-04-18 NOTE — Unmapped (Addendum)
Pre-Procedure Instructions    We???re pleased that you have chosen [facility] for your upcoming procedure.  The staff serving you is professionally trained to provide the highest quality care.  We encourage you to ask questions and to let the staff know your special needs.  We want your visit to be as comfortable as possible.    Your procedure is scheduled on 04/25/2017. The surgeon's office will contact you regarding the time of your procedure.   Please arrive two hours prior to scheduled surgery time at: [facility] on the second floor of the main hospital     ??? DO NOT EAT OR DRINK ANYTHING (including gum, mints, water, etc.) after midnight the night before your procedure.  You may brush your teeth and gargle on the morning of surgery, but do not swallow any water.  You may take the following medications with a small sip of water:    1. synthroid  2. allegra  3. Benadryl   4. Cromolyn   5. Atarax  6. Prilosec  7. Zantac  8. prednisone     Stop taking all Vitamins and all Supplements including Fish oil. No NSAIDs (Aspirin, Aleve/Naproxen, Advil/Motrin/Ibuprofen over-the-counter or prescribed Meloxicam/Mobic or diclofenac/voltaren) for 7 days prior to surgery. You may continue iron supplements if taking. Acetaminophen (Tylenol) is ok to take for pain.     ??? Please make transportation arrangements and bring a responsible adult to accompany you home and remain with you for 24 hours.  ??? Leave valuables (money, jewelry, credit cards) at home.  If you wear glasses or contacts, bring a case for safekeeping.  ??? Wear casual, loose fitting, and comfortable clothing.  A gown will be provided.  If you are staying overnight, bring a small overnight bag.  (Storage space is limited.)  ??? Please remove all makeup, jewelry, body piercings, powder, perfume, and nail polish before you arrive.  ??? Bring a list of your medications and dose including herbal and over-the-counter medications.  Do not bring any pills or  medications to the hospital. (Exception: transplant patients.)  ??? Bring a photo ID and your insurance card so we can bill your insurance company directly.  ??? Please do not bring any children under the age of 79 to the hospital.  ??? Do not shave in the area of the surgery for 2 days prior to surgery.  If needed, a trained staff member will clip the area immediately before your surgery.  ??? Quit smoking as far in advance of surgery as possible.  Patients who quit at least 30 days before surgery may have better outcomes.  ??? If you are diabetic, pay close attention to your blood sugar and try to keep it in the range your doctor wants it to be in.  If you have low blood sugar prior to coming in to the hospital you may drink a small amount of CLEAR SUGAR-CONTAINING FLUIDS WITHOUT PULP SUCH AS APPLE JUICE OR CLEAR SODA.  Depending on the procedure you are having this could impact the timing of your surgery.  ??? Discuss discontinuing herbal medications with your doctor before surgery.  ??? Please shower at home the evening before and the morning of surgery using an antiseptic agent, if provided, or soap and water.  ??? If you suspect that you have an infection prior to surgery, contact your doctor and surgeon prior to surgery.  Also tell the pre-surgery nurse on the day of surgery.  Contact information:  [facility], Monday - Friday 8:30 am - 5:00 pm   [phone]  [facility], - Friday 8:30 am - 5:00 pm   [phone]

## 2017-04-18 NOTE — Unmapped (Signed)
ANESTHESIOLOGY CONSULTATION AND PRE-OPERATIVE HISTORY AND PHYSICAL       Subjective:      [facility] Attending Physician: Larina Bras, MD  [facility] NP / PA:   Hosie Spangle, CNP    Date of Surgery:  04/25/2017  Surgeon:  Dr. Allena Katz  Diagnosis:  caries  Procedure:  Dental extraction coordinate with Dr. Tomma Lightning to help with sectioning the bridge    Patient ID: Emily Bonilla is a 57 y.o. female.    Patient is being seen today at the request of Dr. Allena Katz to render an opinion on perioperative risk optimization and to coordinate medical care as necessary prior to the following procedure:  Dental extraction.    Chief Complaint   Patient presents with   ??? 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 Feels she may be in a flair that started a couple of days ago. Managing with zyrtex and prednisone 5 mg. Other symptoms include rash, vomiting and diarrhea.     Chronic Medical Conditions, Severity, Optimization:  See below     Duke Activity Scale:  3 - Walking on a flat surface for one or two blocks.           Medical History:     Past Medical History:   Diagnosis Date   ??? Anaphylaxis    ??? Anemia    ??? Anxiety    ??? Asthma    ??? Atrial fibrillation (CMS Dx)    ??? Bronchitis    ??? Heart disease    ??? Neurological disease    ??? OSA (obstructive sleep apnea)     states resolved   ??? Osteoporosis    ??? Psychogenic nonepileptic seizure    ??? Seizures (CMS Dx)    ??? Spontaneous pneumothorax     states pneumomediatsium/pneumopericardium, self resolved   ??? Thyroid disease    ??? Ulcer    ??? WPW (Wolff-Parkinson-White syndrome)        Surgical History:     Past Surgical History:   Procedure Laterality Date   ??? BONE MARROW BIOPSY      benign   ??? CARDIAC ELECTROPHYSIOLOGY MAPPING AND ABLATION      attempted x 2, states not successful either time   ??? CHOLECYSTECTOMY     ??? FACIAL COSMETIC SURGERY     ???  HYSTERECTOMY     ??? LIVER BIOPSY      benign hepatic adednoma per patient   ??? REMOVAL HARDWARE LEG Left 08/17/2016    Procedure: REMOVAL OF HARDWARE LEFT TIBIA;  Surgeon: Andee Lineman, MD;  Location: [facility] OR;  Service: Orthopedics;  Laterality: Left;   ??? TIBIA FRACTURE SURGERY     ??? TONSILLECTOMY      + adenoids       Family History:     Family History   Problem Relation Age of Onset   ??? Kidney failure Mother    ??? Aneurysm Mother        Social History:     Social History     Social History   ??? Marital status: Divorced     Spouse name: N/A   ??? Number of children: N/A   ??? Years of education: N/A     Occupational History   ??? Not on file.     Social History  Main Topics   ??? Smoking status: Never Smoker   ??? Smokeless tobacco: Never Used      Comment: Denies (11/15/2016)   ??? Alcohol use No      Comment: Denies (11/15/2016)   ??? Drug use: No      Comment: Denies (11/15/2016)   ??? Sexual activity: Not Currently     Other Topics Concern   ??? Caffeine Use Yes   ??? Exercise No   ??? Seat Belt Yes     Social History Narrative   ??? No narrative on file       Allergies:     Allergies   Allergen Reactions   ??? Acetaminophen Anaphylaxis     Unknown   ??? Aspirin Anaphylaxis     Unknown   ??? Blue Dye Anaphylaxis     Unknown   ??? Epinephrine Anaphylaxis   ??? Flecainide Shortness Of Breath     Itching, decreased libido, wheezing, flushing, numbness.    ??? Gabapentin Anaphylaxis   ??? Iodinated Contrast- Oral And Iv Dye Anaphylaxis   ??? Isopropyl Alcohol Anaphylaxis   ??? Latex Anaphylaxis and Rash   ??? Lidocaine Anaphylaxis   ??? Nsaids (Non-Steroidal Anti-Inflammatory Drug) Anaphylaxis   ??? Onion Anaphylaxis   ??? 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   ???  Perfume Anaphylaxis   ??? Pregabalin Anaphylaxis   ??? Quetiapine Anaphylaxis     Mental Status Change   ??? Red Dye Anaphylaxis     Unknown   ??? Sulfur Anaphylaxis     Pt states: Any alcohol product   ??? Yellow Dye Anaphylaxis     Unknown   ??? Amitriptyline    ??? Doxepin Other (See Comments)     Unknown   ??? Duloxetine      Other reaction(s): Other (See Comments)  Mental Status Change   ??? Epi E-Z Pen    ??? Hydroxyzine Itching     Unknown   ??? Levofloxacin Itching   ??? Montelukast    ??? Verapamil (Bulk) Hives     Insomnia, flushing, scattered brain, migraine, itching, anxiety.    ??? Adhesive Tape-Silicones Itching, Rash and Swelling     Unknown   ??? Trazodone Rash       Medications:     Prior to Admission medications taking for visit date 04/18/17   Medication Sig Taking? Authorizing Provider   busPIRone (BUSPAR) 10 MG tablet Take 2 tablets (20 mg total) by mouth 3 times a day. Indications: Generalized Anxiety Disorder Yes Jonelle Sidle, MD   cromolyn (GASTROCROM) 100 mg/5 mL solution Take 5 mLs (100 mg total) by mouth 4 times daily before meals and at bedtime. Yes Ilona Dubuske V, DO   diphenhydrAMINE (BENADRYL) 25 mg capsule Take 75-150 mg by mouth daily. Yes Historical Provider, MD   doxepin (SINEQUAN) 10 MG capsule Take 1 capsule (10 mg total) by mouth at bedtime. Yes Ilona Dubuske V, DO   fexofenadine (ALLEGRA) 180 MG tablet Take 1 tablet (180 mg total) by mouth daily. Yes Ilona Dubuske V, DO   furosemide (LASIX) 20 MG tablet Take 1 tablet (20 mg total) by mouth daily. Yes Court Joy, MD   hydrOXYzine HCl (ATARAX) 25 MG tablet Take 1 tablet (25  mg total) by mouth 3 times a day as needed for Anxiety. Indications: anxiety Yes Jonelle Sidle, MD   levalbuterol (XOPENEX) 1.25 mg/3 mL nebulizer solution Inhale 3 mL (1.25 mg total) by nebulization every 6 hours as needed for Wheezing. Yes Ilona Dubuske V, DO   levothyroxine (SYNTHROID, LEVOTHROID) 112 MCG tablet Take one tablet by mouth once daily Yes Court Joy, MD   montelukast  (SINGULAIR) 10 mg tablet TAKE ONE TABLET BY MOUTH AT BEDTIME. Yes Ilona Dubuske V, DO   nebulizer and compressor Devi Use 1 ampule as directed every 4 hours. Indications: Please dispense 1 nebulizer unit Yes Ilona Dubuske V, DO   omeprazole (PRILOSEC) 40 MG capsule Take 40 mg by mouth. Yes Historical Provider, MD   onabotulinumtoxinA (BOTOX COSMETIC) 50 unit SolR Inject into the muscle. Yes Historical Provider, MD   predniSONE (DELTASONE) 20 MG tablet TAKE ONE TABLET BY MOUTH DAILY. Yes Ilona Dubuske V, DO   predniSONE (DELTASONE) 5 MG tablet Take 1 tablet (5 mg total) by mouth daily. Take 3 tablets (15 mg) for 5 days, then 2 tablets (10 mg), then 1 tablet (5 mg) and then stop. Yes Ilona Dubuske V, DO   ranitidine (ZANTAC) 300 MG tablet Take 1 tablet (300 mg total) by mouth 3 times a day. Yes Ilona Dubuske V, DO   triamcinolone (KENALOG) 0.1 % ointment APPLY 2 TIMES DAILY Yes Ilona Dubuske V, DO          Review of Systems   Constitutional: Negative for activity change, appetite change, chills, fatigue and fever.   HENT: Negative for sore throat and trouble swallowing.    Eyes: Negative for pain, discharge, redness and itching.   Respiratory: Negative for apnea, cough, shortness of breath and wheezing.    Cardiovascular: Negative for chest pain, palpitations and leg swelling.        Functionally: the patient is doing mild stretching and ADLs. Able to go up 1 flight of steps with some SOB but no CP, climbing steps is a stressor, patient does not do this often.         Denies orthopnea.    Gastrointestinal: Positive for diarrhea, nausea and vomiting. Negative for abdominal distention, abdominal pain and constipation.   Genitourinary: Negative for difficulty urinating.   Musculoskeletal: Positive for arthralgias and back pain. Negative for gait problem, joint swelling, neck pain and neck stiffness.   Skin: Negative for rash and wound.   Neurological: Negative for dizziness, tremors, seizures, syncope, light-headedness  and numbness.   Hematological: Bruises/bleeds easily.   Psychiatric/Behavioral: The patient is not nervous/anxious.        Objective:   Blood pressure 142/83, pulse 75, temperature 96.8 ??F (36 ??C), temperature source Oral, resp. rate 16, height 5' 7 (1.702 m), weight 212 lb (96.2 kg), SpO2 96 %.    Physical Exam   Constitutional: She is oriented to person, place, and time. She appears well-developed and well-nourished.   HENT:   Head: Normocephalic and atraumatic.   Right Ear: External ear normal.   Left Ear: External ear normal.   Nose: Nose normal.   Mouth/Throat: Oropharynx is clear and moist.   Eyes: Pupils are equal, round, and reactive to light. Conjunctivae and EOM are normal.   Neck: Normal range of motion. Neck supple.   Cardiovascular: Normal rate, regular rhythm, normal heart sounds and intact distal pulses.    Pulmonary/Chest: Effort normal and breath sounds normal.   Abdominal: Soft. Bowel sounds are normal.   Musculoskeletal: Normal  range of motion.   Neurological: She is alert and oriented to person, place, and time.   Skin: Skin is warm and dry.       Airway:  Mallampati II (hard and soft palate, upper portion of tonsils anduvula visible), Thyromental distance 3 finger breadths, opening 3 finger breadths. Good neck ROM. Reports some missing and loose teeth. Denies any chipped teeth.       Lab Review:     Lab Results   Component Value Date    WBC 11.2 (H) 10/28/2016    HGB 12.8 10/28/2016    HCT 38.5 10/28/2016    MCH 29.1 10/28/2016    PLT 331 10/28/2016    GLUCOSE 150 (H) 06/04/2016    CREATININE 0.66 10/28/2016    NA 141 10/28/2016    K 3.6 10/28/2016    CL 104 10/28/2016    CO2 28 10/28/2016    BILITOT 0.3 10/28/2016    PROT 6.6 10/28/2016    AST 16 10/28/2016    ALT 14 10/28/2016    ALKPHOS 110 10/28/2016         Study Results:   Study Results:      Pulmonary function test 07/12/2016    ?? 07/12/16 1335  ??   FEV1 3.02    FEV1% 105    FVC 4.10    FVC% 111    FEV1/FVC 74    FEV1/FVC EXP 79     RESPONSE TO BRONCHODILATOR % FVC, % FEV1    TLC 5.86    TLC% 105    RV 1.63    RV% 85    VC 4.23    VC% 115    DLCO 27.17    DLCO% 109    Impression   [place] [facility]    Pulmonary Function Tests Interpretation    Emily Bonilla is a 57 y.o. female who had a pulmonary function test performed at the [place] [facility].    PFT Spirometry, Lung volumes and Diffusing capacity tests are normal      ??  ??  ECHO 06/2015 (from copies that patient brought with her)  Normal LV and RV function  EF 65%  Trace MR  Grade 2 DD      ASA Physical Status:   ASA Physical Status:  3       Assessment and Recommendations:     Patient is a 57 year old female with caries who is scheduled for dental extraction under [facility] at [facility].     1. Cardiac: No known CAD or CHF.  Most recent ECHO: 06/2015: EF 65 with trace MR and grade 2 DD.  Denies SOB, orthopnea or edema. Duke Activity      1a. WPW/Atrial fibrillation: two unsuccessful ablations. Followed up with Dr. Cyndie Chime of EP here, on 07/28/2016. Per his note:     RECOMMENDATIONS: visit 07/28/2016  1. There is no evidence available at the time of this clinic visit that the patient had any episode of atrial fibrillation. If she did have an episode of atrial fibrillation during stress, and this episode has not recurred, no further treatment is necessary.  2. There is no indication for additional EP workup or treatment for WPW at this time. A concealed accessory pathway is possible. Recommend avoiding AV nodal blocking agents until documentation is available from the prior EP studies.  3.??Agree with continued follow up with psych and allergy and other specialists.  4.??Centralize care through primary care  physician and psychiatry.  5. Should the patient develop any signs of arrhythmia or have any evidence of arrhythmia, we would be happy to see the patient in cardiac EP clinic for further evaluation and management     1b.  Cardiac arrest: in 2012 secondary to an allergic reaction. Per pt history, she was given IV epinephrine secondary to an allergic reaction and then went into V Tach from the epi and required cardioversion.      2. H/o mast cell activation syndrome: reports frequent symptoms including abdominal cramping, diarrhea, syncope, weakness, anaphylaxis with certain fragrances. Denies any syncope in the last two months.  Patient states that mastocytosis was diagnosed in the 1990s however, under recent evaluation with [facility] allergy it was determined that the pt. did not fulfill the criteria for a diagnosis of systemic mastocytosis or idiopathic mast cell activation syndrome (IMCAS).     Taking hydroxazine, doxepin, benedryl daily.  Recently started prednisone 5mg  daily for upcoming procedure    Per recent allergy note (03/10/2017), for her upcoming dental procedure - recommend the typical pretreatment with prednisone/bendaryl 13hrs, 7 hrs and 1 hr prior to surgery and avoid medications that cause direct mast cell degranulation (opioids, neuromuscular blocking agents, NSAIDs, RCM). Placed on benadryl drip during prior procedures.     3. 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  Allergy does not want to redose IVIG at this time.    4. Asthma: Symptoms controlled. Currently not using Levalbuterol nebs as needed (she states that she cannot use inhalers because of the propellants) as well as Singulair which she can use AM of OR.  Denies any recent exacerbation.  Lung sounds today clear. O2 saturation 96% on room air. PFTs 06/2016 showed DLCO% 109, FEV1% 105.    5. Psychogenic nonepileptic seizures: Follows w neurology.  Does not tolerate medication. Last seizure was 09/2016 reports it was a focal seizure.     6. Chronic migraines: receives Botox injection every three months for chronic migraine.     7. Edema: taking lasix. Hold Am of OR.     8. Anemia: received iron injections in the past. Most  recent H&H 12.8/38.5. Will obtain CBC today.     9. GERD: taking Prilosec and zantac. Advised to take AM of OR.     10. Hyporthyroidism: taking synthroid. Advised to take Am of OR.     11. Mood: taking Buspar and doxepin. Advised to continue perioperatively.     12. Vocal cord dysfunction: follows with otolaryngology.  Video scope revealed mild VC edema with slight bowing of LTVF.  She manages symptoms breathing therapy relaxation techniques.      Labs obtained today: BMP, CBC  Hosie Spangle CNP     Anesthetic Considerations: Patient denies a history of anesthetic complications.  Mallampati 2 with good neck range of motion and mouth opening.  Grade 2 view with DL Miller 2 and 1.6XW ETT on previous anesthetic.  Exam does not predict difficulty with IV access.     Anesthetic Recommendations:   Anesthesia type: General anesthesia, airway amendable to DL   Access: Adequate peripheral IV access   Monitors: Standard ASA monitors   Analgesia: OK with fentanyl and dilaudid in the past   Disposition: PACU postoperatively    Special considerations:  - Patient with a history of mast cell activation syndrome, although she follows with allergy and they have not been able to confirm dx of mascocytosis or ICMAS.  Although she certainly is more prone to angioedema and contact dermatitis after exposure to medications or topical allergens.  See full allergy list for details.  - She reports that she is usually well controlled perioperatively with benadryl and prednisone. Her allergist recommended that she have pre-treatment with prednisone/benadryl 13 hours, 7 hours, and 1 hour prior to her procedure. I disucussed with her she will take benedryl PO 13 hrs and 7 hrs prior to surgery.  Will need IV benedryl and steroids immediately prior to surgery  - Uneventful course during last anesthetic. The medications that were used at that time include famotidine, benadryl, versed, fentanyl, propofol, succinylcholine, dexamethasone,  ciprofloxacin, zofran.  - Will need admission overnight at least for 24 hour observation given her mast cell degranulation disorder after surgery.  She also has no one who can stay with her overnight at her own home.  Will notify Dr Eliane Decree office.    All questions answered.    CHRISTOPHER Dennard Nip, DO    Attending Addendum:    I have seen and examined the patient and have made the appropriate changes to the above H&P.  I agree with the assessment and plan.  I have communicated our recommendations to the referring physician via EMR.  Larina Bras, MD

## 2017-04-19 ENCOUNTER — Ambulatory Visit: Admit: 2017-04-19 | Discharge: 2017-04-19 | Payer: PRIVATE HEALTH INSURANCE

## 2017-04-19 DIAGNOSIS — F4323 Adjustment disorder with mixed anxiety and depressed mood: Secondary | ICD-10-CM

## 2017-04-19 NOTE — Unmapped (Signed)
In: ??9am  OUt: ??955am  ??  Chief Issue: stress/anxiety about health??  ??  Subjective:   Pt discussed additional symptoms that she has from her disordered health. ??Oral surgery scheduled for this upcoming Monday.  Will stay one night in hospital  Worried about how this is going to go.   Sent her pre-surgery audio to listen to in order to facilitate as much relaxation as possible prior to the surgery.  Aide will go with her before the surgery.   Has been trying to keep stress levels low in general.   Found out some bad news about moving into a facility - she would have only 50. Per month to spend of her own money, so while she had been looking forward to some reprieve, it appears that this isn't going to be an option right now for her.  Since spring is coming and pollens are high, allergy reactions/stress reactions in her body have been worse.  ????Trying to continue to be optimistic, hopeful. ????  Objective:   Very optimistic and positive on the outside especially with what she is managing health and financially.  MSE  Motor Behavior:??no psychomotor abnormalities  ??  Cognition: alert  ??  Attitude: cooperative  ??  Affect: sad, tearful, appropriate to thought and anxious  ??  Appearance: appropriately dressed  ??  Speech: normal rate  ??  Mood:??depressed, consistent and anxious  ??  Thought Processes:??well organized and no looseness of association??  ??  Perceptions:??no hallucinations or other abnormalities????  ??  Thought content:??no delusions, low self-esteem and demoralized  ??  Insight/ judgement:??no impairments  ??  Suicidal ideation:??none  ??  Homicidal ideation:??none  ??  Symptoms observed during session:  Agitation,??anhedonia,??anxiety/fear,????decreased energy/fatigue, mildly??depressed,??emotional lability, sleep disturbance, restlessness,??sad/pained/worried expression,Open coachable motivated  ??  ??  Assessment:   ??  Intervention strategies implemented and session focus was cognitive behavioral,??insight-oriented,??behavior  modification,??supportive,??biofeedback,??mindfulness strategies  ??  Plan:   ??  Homework:   Practice breathing techniques  Continuing physical exercise  Follow bedtime routine with consistency  Continue to seek out groups that she can potentially get involved in  ??  Future treatment/follow-up issues:  ??two weeks.  Also, refer to Dr. Valeda Malm

## 2017-04-22 NOTE — Other (Signed)
Arrival time [phone]) surg time (0920) on 04/25/2017 confirmed with pt.

## 2017-04-25 ENCOUNTER — Observation Stay
Admit: 2017-04-25 | Discharge: 2017-04-26 | Disposition: A | Discharge status: Home / Self Care | Payer: PRIVATE HEALTH INSURANCE | Source: Ambulatory Visit

## 2017-04-25 DIAGNOSIS — K029 Dental caries, unspecified: Secondary | ICD-10-CM

## 2017-04-25 MED ORDER — traMADol (ULTRAM) tablet 50 mg
50 | Freq: Once | ORAL | Status: AC
Start: 2017-04-25 — End: 2017-04-25

## 2017-04-25 MED ORDER — propofol 10 mg/ml (DIPRIVAN) 10 mg/mL injection
10 | INTRAVENOUS | Status: AC
Start: 2017-04-25 — End: ?

## 2017-04-25 MED ORDER — cromolyn (GASTROCROM) solution 100 mg
100 | Freq: Four times a day (QID) | ORAL | Status: AC
Start: 2017-04-25 — End: 2017-04-25

## 2017-04-25 MED ORDER — busPIRone (BUSPAR) tablet 20 mg
10 | Freq: Three times a day (TID) | ORAL | Status: AC
Start: 2017-04-25 — End: 2017-04-25
  Administered 2017-04-25: 18:00:00 20 mg via ORAL

## 2017-04-25 MED ORDER — fentaNYL (SUBLIMAZE) injection 12.5 mcg
50 | INTRAMUSCULAR | Status: AC | PRN
Start: 2017-04-25 — End: 2017-04-25

## 2017-04-25 MED ORDER — HYDROmorphone (DILAUDID) injection Syrg 0.2 mg
0.5 | INTRAMUSCULAR | Status: AC | PRN
Start: 2017-04-25 — End: 2017-04-25

## 2017-04-25 MED ORDER — doxepin (SINEQUAN) capsule 10 mg
10 | Freq: Every evening | ORAL | Status: AC
Start: 2017-04-25 — End: 2017-04-26
  Administered 2017-04-26: 02:00:00 10 mg via ORAL

## 2017-04-25 MED ORDER — famotidine (PEPCID) tablet 20 mg
20 | Freq: Two times a day (BID) | ORAL | Status: AC
Start: 2017-04-25 — End: 2017-04-25

## 2017-04-25 MED ORDER — levothyroxine (SYNTHROID, LEVOTHROID) tablet 112 mcg
112 | Freq: Every day | ORAL | Status: AC
Start: 2017-04-25 — End: 2017-04-26

## 2017-04-25 MED ORDER — cromolyn (GASTROCROM) solution 200 mg
100 | Freq: Three times a day (TID) | ORAL | Status: AC
Start: 2017-04-25 — End: 2017-04-25
  Administered 2017-04-25: 23:00:00 200 mg via ORAL

## 2017-04-25 MED ORDER — fentaNYL (SUBLIMAZE) injection
50 | INTRAMUSCULAR | Status: AC | PRN
Start: 2017-04-25 — End: 2017-05-02
  Administered 2017-04-25: 14:00:00 25 via INTRAVENOUS
  Administered 2017-04-25 (×2): 50 via INTRAVENOUS

## 2017-04-25 MED ORDER — NON FORMULARY 20 each
Freq: Every day | Status: AC
Start: 2017-04-25 — End: 2017-04-26

## 2017-04-25 MED ORDER — lidocaine (PF) 2% (20 mg/mL) Soln 20 mg
20 | Freq: Once | INTRAMUSCULAR | Status: AC | PRN
Start: 2017-04-25 — End: 2017-04-25

## 2017-04-25 MED ORDER — lactated Ringers infusion
INTRAVENOUS | Status: AC
Start: 2017-04-25 — End: 2017-04-26

## 2017-04-25 MED ORDER — succinylcholine (QUELICIN) 20 mg/mL injection
20 | INTRAMUSCULAR | Status: AC
Start: 2017-04-25 — End: ?

## 2017-04-25 MED ORDER — oxyCODONE (ROXICODONE) immediate release tablet 5 mg
5 | Freq: Once | ORAL | Status: AC | PRN
Start: 2017-04-25 — End: 2017-04-25

## 2017-04-25 MED ORDER — diphenhydrAMINE (BENADRYL) injection 25 mg
50 | Freq: Four times a day (QID) | INTRAMUSCULAR | Status: AC | PRN
Start: 2017-04-25 — End: 2017-04-26
  Administered 2017-04-26 (×2): 25 mg via INTRAVENOUS

## 2017-04-25 MED ORDER — predniSONE (DELTASONE) tablet 5 mg
5 | Freq: Every day | ORAL | Status: AC
Start: 2017-04-25 — End: 2017-04-25
  Administered 2017-04-25: 23:00:00 5 mg via ORAL

## 2017-04-25 MED ORDER — busPIRone (BUSPAR) tablet 20 mg
10 | Freq: Three times a day (TID) | ORAL | Status: AC
Start: 2017-04-25 — End: 2017-04-26
  Administered 2017-04-26: 02:00:00 20 mg via ORAL

## 2017-04-25 MED ORDER — dexamethasone (DECADRON) injection
4 | INTRAMUSCULAR | Status: AC | PRN
Start: 2017-04-25 — End: 2017-05-02
  Administered 2017-04-25: 13:00:00 8 via INTRAVENOUS

## 2017-04-25 MED ORDER — diphenhydrAMINE (BENADRYL) injection 25 mg
50 | Freq: Four times a day (QID) | INTRAMUSCULAR | Status: AC | PRN
Start: 2017-04-25 — End: 2017-04-25
  Administered 2017-04-25 (×2): 25 mg via INTRAVENOUS

## 2017-04-25 MED ORDER — traMADol (ULTRAM) tablet 50 mg
50 | Freq: Three times a day (TID) | ORAL | Status: AC | PRN
Start: 2017-04-25 — End: 2017-04-25

## 2017-04-25 MED ORDER — predniSONE (DELTASONE) tablet 5 mg
5 | Freq: Every day | ORAL | Status: AC
Start: 2017-04-25 — End: 2017-04-26

## 2017-04-25 MED ORDER — furosemide (LASIX) tablet 20 mg
20 | Freq: Every day | ORAL | Status: AC
Start: 2017-04-25 — End: 2017-04-26

## 2017-04-25 MED ORDER — rocuronium (ZEMURON) 10 mg/mL injection
10 | INTRAVENOUS | Status: AC
Start: 2017-04-25 — End: ?

## 2017-04-25 MED ORDER — fentaNYLSUBLIMAZEinjection125mcg
50 | INTRAMUSCULAR | Status: AC | PRN
Start: 2017-04-25 — End: 2017-04-25

## 2017-04-25 MED ORDER — loratadine (CLARITIN) tablet 10 mg
10 | Freq: Every day | ORAL | Status: AC
Start: 2017-04-25 — End: 2017-04-25

## 2017-04-25 MED ORDER — HYDROmorphone (DILAUDID) injection Syrg 0.5 mg
1 | Freq: Four times a day (QID) | INTRAMUSCULAR | Status: AC | PRN
Start: 2017-04-25 — End: 2017-04-26
  Administered 2017-04-26: 03:00:00 0.5 mg via INTRAVENOUS

## 2017-04-25 MED ORDER — fentaNYL (SUBLIMAZE) injection 6.5 mcg
50 | INTRAMUSCULAR | Status: AC | PRN
Start: 2017-04-25 — End: 2017-04-25

## 2017-04-25 MED ORDER — ondansetron (ZOFRAN) 4 mg/2 mL injection
4 | INTRAMUSCULAR | Status: AC
Start: 2017-04-25 — End: ?

## 2017-04-25 MED ORDER — NON FORMULARY 300 each
Freq: Three times a day (TID) | Status: AC
Start: 2017-04-25 — End: 2017-04-26
  Administered 2017-04-26: 02:00:00 300 via ORAL

## 2017-04-25 MED ORDER — midazolam (PF) (VERSED) injection
1 | INTRAMUSCULAR | Status: AC | PRN
Start: 2017-04-25 — End: 2017-05-02
  Administered 2017-04-25: 13:00:00 2 via INTRAVENOUS

## 2017-04-25 MED ORDER — propofol10mgmlDIPRIVAN10mgmLinjection
10 | INTRAVENOUS | Status: AC
Start: 2017-04-25 — End: ?

## 2017-04-25 MED ORDER — lidocaine (PF) 2% (20 mg/mL) 20 mg/mL (2 %) Soln
20 | INTRAMUSCULAR | Status: AC
Start: 2017-04-25 — End: ?

## 2017-04-25 MED ORDER — lactated Ringers infusion
INTRAVENOUS | Status: AC
Start: 2017-04-25 — End: 2017-04-25

## 2017-04-25 MED ORDER — pantoprazole (PROTONIX) EC tablet 40 mg
40 | Freq: Every day | ORAL | Status: AC
Start: 2017-04-25 — End: 2017-04-25

## 2017-04-25 MED ORDER — levothyroxine (SYNTHROID, LEVOTHROID) tablet 112 mcg
112 | Freq: Every day | ORAL | Status: AC
Start: 2017-04-25 — End: 2017-04-25

## 2017-04-25 MED ORDER — HYDROmorphone (DILAUDID) injection Syrg 0.6 mg
1 | INTRAMUSCULAR | Status: AC | PRN
Start: 2017-04-25 — End: 2017-04-25
  Administered 2017-04-25: 15:00:00 0.6 mg via INTRAVENOUS

## 2017-04-25 MED ORDER — dexamethasone (DECADRON) injection 8 mg
4 | Freq: Once | INTRAMUSCULAR | Status: AC
Start: 2017-04-25 — End: 2017-04-25

## 2017-04-25 MED ORDER — diphenhydrAMINE (BENADRYL) injection 25 mg
50 | INTRAMUSCULAR | Status: AC | PRN
Start: 2017-04-25 — End: 2017-04-25
  Administered 2017-04-25: 15:00:00 25 mg via INTRAVENOUS

## 2017-04-25 MED ORDER — propofol 10 mg/ml (DIPRIVAN) injection
10 | INTRAVENOUS | Status: AC | PRN
Start: 2017-04-25 — End: 2017-05-02
  Administered 2017-04-25: 13:00:00 200 via INTRAVENOUS
  Administered 2017-04-25: 14:00:00 30 via INTRAVENOUS

## 2017-04-25 MED ORDER — fentaNYL (SUBLIMAZE) 50 mcg/mL injection
50 | INTRAMUSCULAR | Status: AC
Start: 2017-04-25 — End: ?

## 2017-04-25 MED ORDER — albuterol (PROVENTIL) nebulizer solution 2.5 mg
2.5 | RESPIRATORY_TRACT | Status: AC | PRN
Start: 2017-04-25 — End: 2017-04-26

## 2017-04-25 MED ORDER — montelukast (SINGULAIR) tablet 10 mg
10 | Freq: Every evening | ORAL | Status: AC
Start: 2017-04-25 — End: 2017-04-26
  Administered 2017-04-26: 02:00:00 10 mg via ORAL

## 2017-04-25 MED ORDER — dexamethasone (DECADRON) 10 mg/mL injection
10 | INTRAMUSCULAR | Status: AC
Start: 2017-04-25 — End: 2017-04-25

## 2017-04-25 MED ORDER — ondansetron (ZOFRAN) injection 4 mg
4 | Freq: Three times a day (TID) | INTRAMUSCULAR | Status: AC | PRN
Start: 2017-04-25 — End: 2017-04-25

## 2017-04-25 MED ORDER — cromolyn (GASTROCROM) solution 200 mg
100 | Freq: Four times a day (QID) | ORAL | Status: AC
Start: 2017-04-25 — End: 2017-04-26
  Administered 2017-04-26: 15:00:00 200 mg via ORAL

## 2017-04-25 MED ORDER — hydrOXYzine HCl (ATARAX) tablet 25 mg
25 | Freq: Three times a day (TID) | ORAL | Status: AC | PRN
Start: 2017-04-25 — End: 2017-04-26

## 2017-04-25 MED ORDER — cetirizine (ZYRTEC) tablet 10 mg
10 | Freq: Every day | ORAL | Status: AC
Start: 2017-04-25 — End: 2017-04-26

## 2017-04-25 MED ORDER — fentaNYL (SUBLIMAZE) injection 50 mcg
50 | INTRAMUSCULAR | Status: AC | PRN
Start: 2017-04-25 — End: 2017-04-25

## 2017-04-25 MED ORDER — furosemide (LASIX) tablet 20 mg
20 | Freq: Every day | ORAL | Status: AC
Start: 2017-04-25 — End: 2017-04-25

## 2017-04-25 MED ORDER — famotidine (PF) (PEPCID) injection
20 | INTRAVENOUS | Status: AC | PRN
Start: 2017-04-25 — End: 2017-05-02
  Administered 2017-04-25: 13:00:00 20 via INTRAVENOUS

## 2017-04-25 MED ORDER — doxepin (SINEQUAN) capsule 10 mg
10 | Freq: Every evening | ORAL | Status: AC
Start: 2017-04-25 — End: 2017-04-25

## 2017-04-25 MED ORDER — HYDROmorphone (DILAUDID) injection Syrg 0.1 mg
0.5 | INTRAMUSCULAR | Status: AC | PRN
Start: 2017-04-25 — End: 2017-04-25

## 2017-04-25 MED ORDER — diphenhydrAMINE (BENADRYL) 50 mg/mL injection
50 | INTRAMUSCULAR | Status: AC
Start: 2017-04-25 — End: 2017-04-25

## 2017-04-25 MED ORDER — famotidine (PF) (PEPCID) injection 20 mg
20 | Freq: Once | INTRAVENOUS | Status: AC
Start: 2017-04-25 — End: 2017-04-25

## 2017-04-25 MED ORDER — succinylcholine (QUELICIN) injection
20 | INTRAMUSCULAR | Status: AC | PRN
Start: 2017-04-25 — End: 2017-05-02
  Administered 2017-04-25: 13:00:00 70 via INTRAVENOUS

## 2017-04-25 MED ORDER — midazolam (PF) (VERSED) 1 mg/mL injection
1 | INTRAMUSCULAR | Status: AC
Start: 2017-04-25 — End: ?

## 2017-04-25 MED ORDER — propofol (DIPRIVAN) infusion 10 mg/mL ADS Med
10 | INTRAVENOUS | Status: AC
Start: 2017-04-25 — End: ?

## 2017-04-25 MED ORDER — diphenhydrAMINE (BENADRYL) capsule 25 mg
25 | ORAL | Status: AC | PRN
Start: 2017-04-25 — End: 2017-04-26

## 2017-04-25 MED ORDER — HYDROmorphone (DILAUDID) injection Syrg 0.4 mg
1 | INTRAMUSCULAR | Status: AC | PRN
Start: 2017-04-25 — End: 2017-04-25
  Administered 2017-04-25: 15:00:00 0.4 mg via INTRAVENOUS

## 2017-04-25 MED ORDER — HYDROmorphone (DILAUDID) tablet 1 mg
2 | Freq: Once | ORAL | Status: AC
Start: 2017-04-25 — End: 2017-04-25

## 2017-04-25 MED ORDER — naloxone (NARCAN) injection 0.04 mg
0.4 | INTRAMUSCULAR | Status: AC | PRN
Start: 2017-04-25 — End: 2017-04-25

## 2017-04-25 MED ORDER — oxyCODONE (ROXICODONE) immediate release tablet 10 mg
5 | Freq: Once | ORAL | Status: AC | PRN
Start: 2017-04-25 — End: 2017-04-25

## 2017-04-25 MED ORDER — fentaNYL (SUBLIMAZE) injection 25 mcg
50 | INTRAMUSCULAR | Status: AC | PRN
Start: 2017-04-25 — End: 2017-04-25

## 2017-04-25 MED ORDER — lactated Ringers infusion
INTRAVENOUS | Status: AC | PRN
Start: 2017-04-25 — End: 2017-05-02
  Administered 2017-04-25: 13:00:00 via INTRAVENOUS

## 2017-04-25 MED ORDER — diphenhydrAMINE (BENADRYL) injection 25 mg
50 | Freq: Once | INTRAMUSCULAR | Status: AC
Start: 2017-04-25 — End: 2017-04-25

## 2017-04-25 MED ORDER — famotidine (PEPCID) tablet 20 mg
20 | Freq: Two times a day (BID) | ORAL | Status: AC
Start: 2017-04-25 — End: 2017-04-26
  Administered 2017-04-26: 02:00:00 20 mg via ORAL

## 2017-04-25 MED FILL — HYDROMORPHONE 1 MG/ML INJECTION SYRINGE: 1 1 mg/mL | INTRAMUSCULAR | Qty: 1

## 2017-04-25 MED FILL — MIDAZOLAM (PF) 1 MG/ML INJECTION SOLUTION: 1 1 mg/mL | INTRAMUSCULAR | Qty: 2

## 2017-04-25 MED FILL — DIPHENHYDRAMINE 50 MG/ML INJECTION SOLUTION: 50 50 mg/mL | INTRAMUSCULAR | Qty: 1

## 2017-04-25 MED FILL — PROPOFOL 10 MG/ML INTRAVENOUS EMULSION: 10 10 mg/mL | INTRAVENOUS | Qty: 20

## 2017-04-25 MED FILL — DIPRIVAN 10 MG/ML INTRAVENOUS EMULSION: 10 10 mg/mL | INTRAVENOUS | Qty: 100

## 2017-04-25 MED FILL — PREDNISONE 5 MG TABLET: 5 5 MG | ORAL | Qty: 1

## 2017-04-25 MED FILL — DEXAMETHASONE SODIUM PHOSPHATE 10 MG/ML INJECTION SOLUTION: 10 10 mg/mL | INTRAMUSCULAR | Qty: 1

## 2017-04-25 MED FILL — FENTANYL (PF) 50 MCG/ML INJECTION SOLUTION: 50 50 mcg/mL | INTRAMUSCULAR | Qty: 2

## 2017-04-25 MED FILL — LORATADINE 10 MG TABLET: 10 10 mg | ORAL | Qty: 1

## 2017-04-25 MED FILL — XYLOCAINE-MPF 20 MG/ML (2 %) INJECTION SOLUTION: 20 20 mg/mL (2 %) | INTRAMUSCULAR | Qty: 5

## 2017-04-25 MED FILL — DOXEPIN 10 MG CAPSULE: 10 10 MG | ORAL | Qty: 1

## 2017-04-25 MED FILL — QUELICIN 20 MG/ML INJECTION SOLUTION: 20 20 mg/mL | INTRAMUSCULAR | Qty: 10

## 2017-04-25 MED FILL — BUSPIRONE 10 MG TABLET: 10 10 MG | ORAL | Qty: 2

## 2017-04-25 MED FILL — ONDANSETRON HCL (PF) 4 MG/2 ML INJECTION SOLUTION: 4 4 mg/2 mL | INTRAMUSCULAR | Qty: 2

## 2017-04-25 MED FILL — ROCURONIUM 10 MG/ML INTRAVENOUS SOLUTION: 10 10 mg/mL | INTRAVENOUS | Qty: 1

## 2017-04-25 MED FILL — LEVOTHYROXINE 112 MCG TABLET: 112 112 MCG | ORAL | Qty: 1

## 2017-04-25 MED FILL — TRAMADOL 50 MG TABLET: 50 50 mg | ORAL | Qty: 1

## 2017-04-25 MED FILL — HYDROXYZINE HCL 25 MG TABLET: 25 25 MG | ORAL | Qty: 1

## 2017-04-25 MED FILL — FAMOTIDINE (PF) 20 MG/2 ML INTRAVENOUS SOLUTION: 20 20 mg/2 mL | INTRAVENOUS | Qty: 2

## 2017-04-25 MED FILL — OXYCODONE 5 MG TABLET: 5 5 MG | ORAL | Qty: 2

## 2017-04-25 NOTE — Progress Notes (Signed)
Pt to room 10 per PACU protocol. Bedside report per anesthesia.

## 2017-04-25 NOTE — Unmapped (Signed)
[facility]                      [facility]     PATIENT NAME:   Emily Bonilla, Emily Bonilla              MRN: [id number]  DATE OF BIRTH:  [birth year]                     CSN: [id number]  SURGEON:        Gladys Damme, D.M.D.           ADMIT DATE: 04/25/2017  SERVICE:        Oral and Maxillofacial Surgery  DICTATED BY:    Harlow Mares, D.M.D.       SURGERY DATE: 04/25/2017                                    OPERATIVE REPORT  Attending Physician: Gladys Damme, DMD    Fist Assistant: Clelia Croft    Second Assistant: Kara Mead, DMD    Third Assistant: Derinda Late, DMD       ANESTHESIA: General anesthesia.     PREOPERATIVE DIAGNOSIS(ES): Dental caries.     POSTOPERATIVE DIAGNOSIS(ES): Dental caries.    PROCEDURE(S) PERFORMED:  1.   Simple extraction of tooth #31.  2.   Sectioning of fixed dental prosthesis, posterior right mandible.     INDICATION(S): The patient is a 57 year old female with a history of mast  cell degranulation disorder, atrial fibrillation and Wolff-Parkinson-White  disorder. She has had cardiac arrests twice due to anaphylaxis with return of  spontaneous circulation after having been coded. The patient is severely  atopic with sensitivities to multiple substances including all local  anesthetics and narcotics except Dilaudid.  She has had multiple anesthetics  in the past for removal of hardware, fractures of the lower extremities. She  was sent to our clinic for evaluation for extraction of all remaining teeth.  On our evaluation, it did not appear that she needed to have all remaining  teeth removed; therefore, we consulted another general dentist who would like  for [place] to remove tooth #31. Tooth #31 is in a fixed dental prosthesis with  #29, #30 and #31; therefore, the fixed dental prosthesis would have to be  sectioned for removal of tooth #31.  After we had all the appropriate  information, we then booked the patient for the  operating room to be treated.  We did have the patient sent for perioperative care which anesthesia  recommended the patient to be admitted for 23-hour observation after her  procedure given her multiple medical comorbidities.      DETAILS OF PROCEDURE(S):  The patient was seen in same day surgery where all  questions were answered. The risks and benefits of the procedure were  discussed and consent was reinforced.  The patient was then taken to  operating room #7 where she was under the care of the anesthesia team where  she underwent a routine orotracheal intubation. On intubation, the patient  did not appear to have any redness of her skin or blotchiness of her skin  that would indicate any kind of anesthetic reaction.  After this she was  turned over to the surgical team where she was prepped and draped in the  standard fashion.  We did not use any intraoral prep.  We did not  use any  local anesthetic. We attempted to use as minimal of products as possible in  order to prevent any kind of anaphylactic type reaction.     After this, attention was turned to the right posterior mandible where the  fixed dental prosthesis was noted.  We suctioned the oral cavity and noted it to be free of debris  we placed a moistened throat pack.  After this we had one of the general  dental residents available to section the bridge. The bridge was sectioned  just distal to tooth #29.  After complete sectioning of the bridge we then  turned our attention to tooth #31. Tooth #31 was extracted with simple  forceps technique.  After this, the dental resident again came back and  smoothed the distal surface of tooth #29 in order to have good contour and  not have anything cutting her lip or tongue.  At this time we thoroughly  suctioned the oral cavity and all was noted to be free of debris. We removed  the moistened throat pack and we passed an orogastric tube. After this, the  patient was emerged from anesthesia in the operating  room. She was extubated  successfully in the operating room. Again, did not note any blotchy redness  or any type of airway edema or anything to note any type of allergic  reaction. At this time the patient was extubated and then transferred to PACU  where upon meeting PACU criteria will be admitted up to the floor for  postoperative observation.     COMPLICATIONS:  None.     ESTIMATED BLOOD LOSS:  10 mL.     URINE OUTPUT:  Not recorded.     SPECIMEN(S):  None.                                               Gladys Damme, D.M.D.  WM/tlm                                 Dictated by:  Harlow Mares, D.M.D.  D:  04/25/2017 10:05  T:  04/25/2017 11:42  R:  04/25/2017 13:39 - tmg  Job #:  [id number]           OPERATIVE REPORT                                             PAGE    1 of   1

## 2017-04-25 NOTE — Other (Signed)
Anesthesia Extubation Criteria:    Airway Device: endotracheal tube    Emergence Details:      Smooth      _x_      Stormy       __       Prolonged   __     Extubation Criteria:      Motor strength intact       _x_      Follows commands        _x_      Good airway reflexes      _x_      OP suctioned                  _x_        Follows commands:  Yes     Patient extubated:  Yes

## 2017-04-25 NOTE — Unmapped (Signed)
Oral and Maxillofacial Surgery Progress Note    Patient: Emily Bonilla  MRN: [id number]    Procedure:   Ext #31, sectioning of fixed dental prosthesis by dental    Subjective:   Emily Bonilla is a 57 y.o. female 10 hours s/p ext of #31. Pt reports she is overall feeling well and rates her pain a 4/10 to her right mandible. Pt reports a headache that she is trying to prevent from progressing to a migraine at this time. Pt reports completing her meditation to aid in her pain management. Pt denies blood oozing into her mouth but she is concerned for hemorrhaging from her past experiences with dental extractions.     Denies nausea, vomiting, cough, cp, dyspnea, dysphagia.       Objective:   Vitals:  BP  Min: 91/62  Max: 151/83  Location could not be evaluated. This SmartLink does not work with rows of the type: String Type  Pulse  Avg: 91.9  Min: 83  Max: 105  Temp  Avg: 97.8 ??F (36.6 ??C)  Min: 97.2 ??F (36.2 ??C)  Max: 98 ??F (36.7 ??C)  SpO2  Avg: 95.9 %  Min: 94 %  Max: 99 %  FiO2  Avg: 95.9 %  Min: 92 %  Max: 99 %  Vitals:    04/25/17 1644   BP: 151/83   Pulse: 105   Resp: 18   Temp: 97.9 ??F (36.6 ??C)   SpO2:          Date 04/24/17 1500 - 04/25/17 0659(Not Admitted) 04/25/17 0700 - 04/26/17 0659   Shift 1500-2259 2300-0659 24 Hour Total 0700-1459 1500-2259 2300-0659 24 Hour Total   I  N  T  A  K  E   P.O.    0   0      P.O.    0   0    I.V.    700  (7.3)   700  (7.3)      Volume (mL) (lactated Ringers infusion)    700   700    Shift Total  (mL/kg)    700  (7.3)   700  (7.3)   O  U  T  P  U  T   Urine    0  (0)   0      Urine    0   0    Shift Total  (mL/kg)    0  (0)   0  (0)   Weight (kg)    96.2 96.2 96.2 96.2       Physical Exam:  AAO x 4, NAD, resting comfortably in bed  CN V2 intact  CN V3 intact  Extraction site hemostatic and clean  Occlusion stable and appropriate  Neck supple, non-tender, trachea midline  RRR, no MRG  CTAB, no RRW  Abd soft, nt, nd  MAES    Labs:  No results for input(s): WBC, HGB, HCT, PLT  in the last 72 hours.  No results for input(s): NA, K, CL, CO2, BUN, CREATININE, GLUCOSE, CALCIUM, MG, PHOS in the last 72 hours.  No results for input(s): POCGLU, POCGMD in the last 72 hours.    Invalid input(s): GLU      Current Medications:  Scheduled Medications:    busPIRone 20 mg TID   cromolyn 200 mg TID AC   dexamethasone 8 mg Once   doxepin 10 mg Nightly (2100)   famotidine 20 mg BID   [START ON 04/26/2017]  furosemide 20 mg Daily 0900   [START ON 04/26/2017] levothyroxine 112 mcg DAILY 0600   [START ON 04/26/2017] loratadine 10 mg Daily 0900   [START ON 04/26/2017] pantoprazole 40 mg DAILY 0600   predniSONE 5 mg Daily 0900   traMADol 50 mg Once      IV Medications:    lactated Ringers Last Rate: Stopped (04/25/17 1123)      PRN Medications:    albuterol 2.5 mg RT Q4H PRN   diphenhydrAMINE 25 mg Q4H PRN   diphenhydrAMINE 25 mg Q6H PRN   hydrOXYzine HCl 25 mg TID PRN       Imaging:    No results found.    Assessment/Plan:   Emily Bonilla is a 57 y.o. female 10 hours s/p ext of #31 and sectioning of fixed dental prosthesis. Pt is following appropriate post operative course and reports her pain is at a level she can tolerate.     - Continue pain management per primary team   - Full diet as tolerated  - No need for OMS follow up  - OMS to sign off, please page with any questions    Jinny Sanders, DDS    04/25/2017   7:29 PM  ?[id number]

## 2017-04-25 NOTE — Unmapped (Signed)
EXTRACTION TOOTH # 31, BRIDGE RESECTIONING  Procedure Note    Emily Bonilla  04/25/2017      Pre-op Diagnosis: Caries [K02.9]       Post-op Diagnosis: same    Procedure(s):  EXTRACTION TOOTH # 31, BRIDGE sectioning of 29x31 bridge      Surgeon:  Gladys Damme, DMD    Anesthesia: General    Staff:   Circulator: Lynett Fish, RN  Scrub Person: Unknown Jim  Resident: Si Gaul Ericka Pontiff, DMD; Assunta Found McLaurin, DMD    Estimated Blood Loss: Minimal                 Specimens: none           Drains:  none      There were no complications unless listed below.        [name]     Date: 04/25/2017  Time: 10:02 AM

## 2017-04-25 NOTE — H&P (Signed)
HOSPITAL MEDICINE  HISTORY & PHYSICAL    Name: Emily Bonilla  MRN: [id number]  CSN: [id number]    Chief complaint:  caries    History of Present Illness     Emily Bonilla is an 57 y.o. 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 OMFS is requesting this patient be admitted post-operatively for close observation given her multiple medical problems.    Obtaining an accurate medical history was somewhat challenging; patient is somewhat circumferential. The patient has been through multiple medical systems on review of care everywhere. She reports a history of mast cell activation syndrome and CVID. Apparently was at one point getting IVIG but stopped in 2013 when she was transitioned I tsubcutaneous Ig.  but no longer. She had a negative bone marrow biopsy in the past. She was last seen by our allergist Feb 2019 and they reviewed her previous medical record and tryptase levels, and feel that it does not support her reported hx of mast cell disorder. She also has a history of dermatographia and reports having a lot of anxiety and stress which triggers a lot of her symptoms. 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     I interviewed the patient post-operatively. She is doing well. Denies of any shortness of breath. No hives or pruritis. She had taken all of her morning medications prior to the surgery.     Review of Systems     No fever, no chills  No cough  No shortness of breath  No wheezing  No pruritis or rash    A 10-system review was performed and is otherwise negative.       Past Medical & Surgical History     Past Medical History:   Diagnosis Date    Anaphylaxis     Anemia     Anxiety     Asthma     Atrial fibrillation (CMS Dx)     Bronchitis     Heart disease     Neurological disease      OSA (obstructive sleep apnea)     states resolved    Osteoporosis     Psychogenic nonepileptic seizure     Seizures (CMS Dx)     Spontaneous pneumothorax     states pneumomediatsium/pneumopericardium, self resolved    Thyroid disease     Ulcer     WPW (Wolff-Parkinson-White syndrome)        Past Surgical History:   Procedure Laterality Date    BONE MARROW BIOPSY      benign    CARDIAC ELECTROPHYSIOLOGY MAPPING AND ABLATION      attempted x 2, states not successful either time    CHOLECYSTECTOMY      FACIAL COSMETIC SURGERY      HYSTERECTOMY      LIVER BIOPSY      benign hepatic adednoma per patient    REMOVAL HARDWARE LEG Left 08/17/2016    Procedure: REMOVAL OF HARDWARE LEFT TIBIA;  Surgeon: Andee Lineman, MD;  Location: [facility] OR;  Service: Orthopedics;  Laterality: Left;    TIBIA FRACTURE SURGERY      TONSILLECTOMY      + adenoids         Family History     Family History   Problem Relation Age of Onset    Kidney failure  Mother     Aneurysm Mother          Social History     Social History   Substance Use Topics    Smoking status: Never Smoker    Smokeless tobacco: Never Used      Comment: Denies (11/15/2016)    Alcohol use No      Comment: Denies (11/15/2016)         Medications     Prescriptions Prior to Admission   Medication Sig Dispense Refill Last Dose    busPIRone (BUSPAR) 10 MG tablet Take 2 tablets (20 mg total) by mouth 3 times a day. Indications: Generalized Anxiety Disorder 180 tablet 2 04/25/2017 at Unknown time    cromolyn (GASTROCROM) 100 mg/5 mL solution Take 5 mLs (100 mg total) by mouth 4 times daily before meals and at bedtime. 480 mL 11 04/24/2017 at Unknown time    diphenhydrAMINE (BENADRYL) 25 mg capsule Take 75-150 mg by mouth daily.   04/25/2017 at Unknown time    doxepin (SINEQUAN) 10 MG capsule Take 1 capsule (10 mg total) by mouth at bedtime. 90 capsule 3 04/24/2017 at Unknown time    fexofenadine (ALLEGRA) 180 MG tablet Take 1 tablet (180 mg total) by mouth  daily. 30 tablet 3 04/24/2017 at Unknown time    furosemide (LASIX) 20 MG tablet Take 1 tablet (20 mg total) by mouth daily. 90 tablet 3 04/25/2017 at Unknown time    hydrOXYzine HCl (ATARAX) 25 MG tablet Take 1 tablet (25 mg total) by mouth 3 times a day as needed for Anxiety. Indications: anxiety 90 tablet 2 04/25/2017 at Unknown time    levothyroxine (SYNTHROID, LEVOTHROID) 112 MCG tablet Take one tablet by mouth once daily 90 tablet 3 04/25/2017 at Unknown time    montelukast (SINGULAIR) 10 mg tablet TAKE ONE TABLET BY MOUTH AT BEDTIME. 30 tablet 0 04/24/2017 at Unknown time    omeprazole (PRILOSEC) 40 MG capsule Take 40 mg by mouth.   04/25/2017 at Unknown time    predniSONE (DELTASONE) 5 MG tablet Take 1 tablet (5 mg total) by mouth daily. Take 3 tablets (15 mg) for 5 days, then 2 tablets (10 mg), then 1 tablet (5 mg) and then stop. 30 tablet 0 04/25/2017 at Unknown time    ranitidine (ZANTAC) 300 MG tablet Take 1 tablet (300 mg total) by mouth 3 times a day. 90 tablet 11 04/25/2017 at Unknown time    levalbuterol (XOPENEX) 1.25 mg/3 mL nebulizer solution Inhale 3 mL (1.25 mg total) by nebulization every 6 hours as needed for Wheezing. 72 mL 0 More than a month at Unknown time    nebulizer and compressor Devi Use 1 ampule as directed every 4 hours. Indications: Please dispense 1 nebulizer unit 1 each 0 More than a month at Unknown time    onabotulinumtoxinA (BOTOX COSMETIC) 50 unit SolR Inject into the muscle.   Unknown at Unknown time    predniSONE (DELTASONE) 20 MG tablet TAKE ONE TABLET BY MOUTH DAILY. 30 tablet 0 More than a month at Unknown time    triamcinolone (KENALOG) 0.1 % ointment APPLY 2 TIMES DAILY 80 g 11 Unknown at Unknown time          Vital Signs     Temp:  [97.2 F (36.2 C)-98 F (36.7 C)] 98 F (36.7 C)  Heart Rate:  [83-95] 89  Resp:  [13-20] 14  BP: (91-150)/(44-88) 131/81  FiO2:  [92 %-99 %] 97 %    Intake/Output  Summary (Last 24 hours) at 04/25/17 1309  Last data filed at 04/25/17 1123    Gross per 24 hour   Intake              700 ml   Output                0 ml   Net              700 ml         Physical Exam     Gen - alert, no distress  Eyes - normal conjunctiva, normal pupils  ENT - moist mucosa  Neck - supple, no thyromegaly  CV - RRR, no MRG, no edema  Lung - CTA, normal WOB  Abd -  soft nt/nd, no organomegaly  MSK - no clubbing, no cyanosis, no joint swelling, no muscle tenderness  Skin - normal temp, no rashes  Neuro - alert, oriented to self/place/time/situation, no facial assymmetry, no gross focal weakness, sensation grossly intact to light touch    Laboratory Data                                   Invalid input(s): PROTEIN          Invalid input(s): WBCCAST, GRANCAST          No results found for: NTPROBNP    Lab Results   Component Value Date    TSH 2.29 10/28/2016                 Diagnostic Studies     NONE NEW    Assessment & Plan     Emily Bonilla is a 57 y.o. female being admitted to the hospital for post-procedural observation after tooth extraction    Principal Problem:    Caries      # Reported hx of mast cell activation - per most recent allergy note, review of her prior workup and labs do not support this diagnosis. Nevertheless, her symptoms appear controlled. Continue doxepin, hydroxyzine, H2 blocker, cromolyn. Symptoms appear to be associated with anxiety and stress.     # Asthma - per patient well-controlled and has not required inhalers in years. Continue levalbuterol prn.    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 Not being actively managed.     # Dental caries - status-post tooth extraction and bridge removal.     # PNES - recently diagnosed by neurology.    # Disposition: from my standpoint she is stable to be discharged today, however she has made arrangements for planned discharge tomorrow. Will observe overnight.     Kathlen Brunswick, DO  Department of Internal Medicine  Pager ID [phone])  04/25/2017, 1:09 PM

## 2017-04-25 NOTE — Anesthesia Pre-Procedure Evaluation (Signed)
[facility]  DEPARTMENT OF ANESTHESIOLOGY  PRE-PROCEDURAL EVALUATION    Emily Bonilla is a 57 y.o. year old female presenting for:    Procedure(s):  EXTRACTION TOOTH # 31, BRIDGE RESECTIONING    Surgeon:   Gladys Damme, DMD    Chief Complaint     Caries [K02.9]    Review of Systems     Anesthesia Evaluation    Patient summary reviewed, nursing notes reviewed, CPC/PAT note reviewed and Previous anesthesia note reviewed.  All other systems reviewed and are negative.     No history of anesthetic complications   I have reviewed the History and Physical Exam, any relevant changes are noted in the anesthesia pre-operative evaluation.      Cardiovascular:    Exercise tolerance: poor  [employer] Met score: 4 - Raking leaves. Weeding or pushing a power mower.  (+) dysrhythmias (Hx A fib/WPW, reportedly s/p 2 unsuccessful ablations), CHF (G2DD) and hyperlipidemia.  Hypertension (not currently on medications) is.  Valvular problems/murmurs (Trace) related to MR.    (-) past MI, CAD, CABG/stent.    Neuro/Muscoloskeletal/Psych:    (+) neuromuscular disease, psychiatric history, anxiety and depression.  Seizures (last seizure was November. Not on medication.).    (-) TIA, CVA, headaches.     Pulmonary:    (+) asthma.  Sleep apnea (postive for OSA per sleep studies).    (-) shortness of breath, recent URI, no PE.       GI/Hepatic/Renal:    GERD is well controlled.    (-) hepatitis, liver disease, renal disease.    Endo/Other:    (+) hypothyroidism.      (-) diabetes mellitus, no anemia, no thrombocytopenia.     Comments: Chart Hx of Idiopathic mast cell activation syndrome       Past Medical History     Past Medical History:   Diagnosis Date    Anaphylaxis     Anemia     Anxiety     Asthma     Atrial fibrillation (CMS Dx)     Bronchitis     Heart disease     Neurological disease     OSA (obstructive sleep apnea)     states resolved    Osteoporosis     Psychogenic nonepileptic seizure     Seizures (CMS Dx)     Spontaneous  pneumothorax     states pneumomediatsium/pneumopericardium, self resolved    Thyroid disease     Ulcer     WPW (Wolff-Parkinson-White syndrome)        Past Surgical History     Past Surgical History:   Procedure Laterality Date    BONE MARROW BIOPSY      benign    CARDIAC ELECTROPHYSIOLOGY MAPPING AND ABLATION      attempted x 2, states not successful either time    CHOLECYSTECTOMY      FACIAL COSMETIC SURGERY      HYSTERECTOMY      LIVER BIOPSY      benign hepatic adednoma per patient    REMOVAL HARDWARE LEG Left 08/17/2016    Procedure: REMOVAL OF HARDWARE LEFT TIBIA;  Surgeon: Andee Lineman, MD;  Location: [facility] OR;  Service: Orthopedics;  Laterality: Left;    TIBIA FRACTURE SURGERY      TONSILLECTOMY      + adenoids       Family History     Family History   Problem Relation Age of Onset    Kidney failure Mother  Aneurysm Mother        Social History     Social History     Social History    Marital status: Divorced     Spouse name: N/A    Number of children: N/A    Years of education: N/A     Occupational History    Not on file.     Social History Main Topics    Smoking status: Never Smoker    Smokeless tobacco: Never Used      Comment: Denies (11/15/2016)    Alcohol use No      Comment: Denies (11/15/2016)    Drug use: No      Comment: Denies (11/15/2016)    Sexual activity: Not Currently     Other Topics Concern    Caffeine Use Yes    Exercise No    Seat Belt Yes     Social History Narrative    No narrative on file       Medications     Allergies:  Allergies   Allergen Reactions    Acetaminophen Anaphylaxis     Unknown    Aspirin Anaphylaxis     Unknown    Blue Dye Anaphylaxis     Unknown    Epinephrine Anaphylaxis    Flecainide Shortness Of Breath     Itching, decreased libido, wheezing, flushing, numbness.     Gabapentin Anaphylaxis    Iodinated Contrast- Oral And Iv Dye Anaphylaxis    Isopropyl Alcohol Anaphylaxis    Latex Anaphylaxis and Rash    Lidocaine Anaphylaxis     Nsaids (Non-Steroidal Anti-Inflammatory Drug) Anaphylaxis    Onion Anaphylaxis    Other Anaphylaxis     HAS MAST CELL ACTIVATION SYNDROME, SO PATIENT HAS ANAPHYLACTIC REACTIONS IF BODY RECOGNIZES TRIGGERS  CANNOT TOLERATE ANY DYES, PRESERVATIVES, PARABENS, ETC.  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    Perfume Anaphylaxis    Pregabalin Anaphylaxis    Quetiapine Anaphylaxis     Mental Status Change    Red Dye Anaphylaxis     Unknown    Sulfur Anaphylaxis     Pt states: Any alcohol product    Yellow Dye Anaphylaxis     Unknown    Amitriptyline     Doxepin Other (See Comments)     Unknown    Duloxetine      Other reaction(s): Other (See Comments)  Mental Status Change    Epi E-Z Pen     Hydroxyzine Itching     Unknown    Levofloxacin Itching    Montelukast     Verapamil (Bulk) Hives     Insomnia, flushing, scattered brain, migraine, itching, anxiety.     Adhesive Tape-Silicones Itching, Rash and Swelling     Unknown    Trazodone Rash       Home Meds:  Prior to Admission medications as of 04/25/17 0715   Medication Sig Taking?   busPIRone (BUSPAR) 10 MG tablet Take 2 tablets (20 mg total) by mouth 3 times a day. Indications: Generalized Anxiety Disorder    cromolyn (GASTROCROM) 100 mg/5 mL solution Take 5 mLs (100 mg total) by mouth 4 times daily before meals and at bedtime.    diphenhydrAMINE (BENADRYL) 25 mg capsule Take 75-150 mg by mouth daily.    doxepin (SINEQUAN) 10 MG capsule Take 1 capsule (10 mg total) by mouth at bedtime.  fexofenadine (ALLEGRA) 180 MG tablet Take 1 tablet (180 mg total) by mouth daily.    furosemide (LASIX) 20 MG tablet Take 1 tablet (20 mg total) by mouth daily.    hydrOXYzine HCl (ATARAX) 25 MG tablet Take 1 tablet (25 mg total) by mouth 3 times a day as needed for Anxiety. Indications: anxiety    levalbuterol (XOPENEX) 1.25 mg/3 mL nebulizer  solution Inhale 3 mL (1.25 mg total) by nebulization every 6 hours as needed for Wheezing.    levothyroxine (SYNTHROID, LEVOTHROID) 112 MCG tablet Take one tablet by mouth once daily    montelukast (SINGULAIR) 10 mg tablet TAKE ONE TABLET BY MOUTH AT BEDTIME.    nebulizer and compressor Devi Use 1 ampule as directed every 4 hours. Indications: Please dispense 1 nebulizer unit    omeprazole (PRILOSEC) 40 MG capsule Take 40 mg by mouth.    onabotulinumtoxinA (BOTOX COSMETIC) 50 unit SolR Inject into the muscle.    predniSONE (DELTASONE) 20 MG tablet TAKE ONE TABLET BY MOUTH DAILY.    predniSONE (DELTASONE) 5 MG tablet Take 1 tablet (5 mg total) by mouth daily. Take 3 tablets (15 mg) for 5 days, then 2 tablets (10 mg), then 1 tablet (5 mg) and then stop.    ranitidine (ZANTAC) 300 MG tablet Take 1 tablet (300 mg total) by mouth 3 times a day.    triamcinolone (KENALOG) 0.1 % ointment APPLY 2 TIMES DAILY        Inpatient Meds:  Scheduled:   Continuous:     PRN:     Vital Signs     Wt Readings from Last 3 Encounters:   04/18/17 212 lb (96.2 kg)   03/10/17 214 lb (97.1 kg)   03/08/17 212 lb (96.2 kg)     Ht Readings from Last 3 Encounters:   04/18/17 5' 7 (1.702 m)   03/10/17 5' 7.01 (1.702 m)   03/08/17 5' 7.01 (1.702 m)     Temp Readings from Last 3 Encounters:   04/18/17 96.8 F (36 C) (Oral)   01/04/17 98.3 F (36.8 C) (Oral)   12/30/16 98.9 F (37.2 C) (Oral)     BP Readings from Last 3 Encounters:   04/18/17 142/83   03/10/17 138/74   03/08/17 124/77     Pulse Readings from Last 3 Encounters:   04/18/17 75   03/10/17 85   03/08/17 87     @LASTSAO2 (3)@    Physical Exam     Airway:     Mallampati: I  TM distance: > = 3 FB  Neck ROM: full  (-) no facial hair, neck not short      Dental:            Pulmonary:       Breath sounds clear to auscultation.     (-) no rhonchi, no decreased breath sounds, no wheezes and no PE.    Cardiovascular:     Rhythm: regular  Rate: normal  (-)  murmur.    Neuro/Musculoskeletal/Psych:    Mental status: alert and oriented to person, place and time.    No sensory deficit.        Abdominal:       Current OB Status:       Other Findings:        Laboratory Data     Lab Results   Component Value Date    WBC 10.4 04/18/2017    HGB 14.3 04/18/2017    HCT 43.0 04/18/2017  MCV 83.5 04/18/2017    PLT 289 04/18/2017       No results found for: [facility]    Lab Results   Component Value Date    GLUCOSE 83 04/18/2017    BUN 10 04/18/2017    CO2 27 04/18/2017    CREATININE 0.66 04/18/2017    K 3.7 04/18/2017    NA 145 04/18/2017    CL 106 04/18/2017    CALCIUM 9.3 04/18/2017    ALBUMIN 4.2 10/28/2016    PROT 6.6 10/28/2016    ALKPHOS 110 10/28/2016    ALT 14 10/28/2016    AST 16 10/28/2016    BILITOT 0.3 10/28/2016       No results found for: PTT, INR    No results found for: PREGTESTUR, PREGSERUM, HCG, HCGQUANT    Anesthesia Plan     ASA 3       Female and opiate use    Anesthesia Type:  general.         Intravenous induction.    Anesthetic plan and risks discussed with patient.    Plan, alternatives, and risks of anesthesia, including death, have been explained to and discussed with the patient/legal guardian.  By my assessment, the patient/legal guardian understands and agrees.  Scenario presented in detail.  Questions answered.      Plan discussed with CRNA, attending and RNSA.

## 2017-04-25 NOTE — TOC Discharge Planning (AHS/AVS) (Signed)
Anesthesia Transfer of Care Note    Patient: Emily Bonilla  Procedure(s) Performed: Procedure(s):  EXTRACTION TOOTH # 31, BRIDGE RESECTIONING    Patient location: PACU    Anesthesia type: general    Airway Device on Arrival to PACU/ICU: Room Air    IV Access: Peripheral    Monitors Recommended to be Used During PACU/ICU: Quarry manager Issues to Address: None    Level of Consciousness: awake and responds to stimulation    Post vital signs:    Vitals:    04/25/17 1012   BP: 143 70   Pulse: 95   Resp: 11   Temp: 97.5   SpO2: 95       Complications: None

## 2017-04-25 NOTE — Other (Signed)
INTRA-OP POST BRIEFING NOTE: Emily Bonilla      Specimens:     Prior to leaving the room: Nurse confirmed name of procedure, completion of instrument, sponge & needle counts, reads specimen labels aloud including patient name and addresses any equipment issues? Nurse confirmed wound class. Nurse to surgeon and anesthesia: What are key concerns for recovery and management of the patient?  Yes      Blood products stored at appropriate temperatures prior to return to blood bank (if applicable)? N/A      Patient identification band secured on patient prior to transfer out of the operating room? Yes      Other Comments:     Signed: Lafonda Mosses    Date: 04/25/2017    Time: 10:12 AM

## 2017-04-25 NOTE — Progress Notes (Signed)
Emily Bonilla has orders to be transferred to the floor. Pt has been assigned a bed on 5E room number 5144. Called report to [facility]. All belongings, chart and meds gathered and transferred with pt. CMU  not ordered. Pt transferred  via bed. All questions answered. Emily Bonilla

## 2017-04-25 NOTE — Anesthesia Post-Procedure Evaluation (Signed)
Anesthesia Post Note    Patient: Emily Bonilla    Procedure(s) Performed: Procedure(s):  EXTRACTION TOOTH # 31, BRIDGE RESECTIONING    Anesthesia type: general    Patient location: PACU     Post pain: Adequate analgesia    Post assessment: no apparent anesthetic complications and tolerated procedure well    Last Vitals:   Vitals:    04/25/17 1025 04/25/17 1040 04/25/17 1100 04/25/17 1115   BP: 150/88 131/81 146/80 131/81   BP Location:    Left arm   Patient Position:    Lying   Pulse: 95 83 84 89   Resp: 13 20 15 14    Temp:    98 F (36.7 C)   TempSrc:    Temporal   SpO2: 96% 98% 94% 95%   Weight:       Height:            Post vital signs: stable    Level of consciousness: awake and alert     Complications: None

## 2017-04-26 MED ORDER — diphenhydrAMINE (BENADRYL) injection 25 mg
50 | Freq: Once | INTRAMUSCULAR | Status: AC
Start: 2017-04-26 — End: 2017-04-26
  Administered 2017-04-26: 14:00:00 25 mg via INTRAVENOUS

## 2017-04-26 MED ORDER — diphenhydrAMINE (BENADRYL) injection 50 mg
50 | Freq: Once | INTRAMUSCULAR | Status: AC
Start: 2017-04-26 — End: 2017-04-26

## 2017-04-26 MED ORDER — ranitidine (ZANTAC) tablet 300 mg
300 | ORAL | Status: AC
Start: 2017-04-26 — End: 2017-04-26
  Administered 2017-04-26: 14:00:00 300 mg via ORAL

## 2017-04-26 MED ORDER — diphenhydrAMINE (BENADRYL) 50 mg/mL injection
50 | INTRAMUSCULAR | Status: AC
Start: 2017-04-26 — End: 2017-04-26
  Administered 2017-04-26: 15:00:00 50 via INTRAVENOUS

## 2017-04-26 MED ORDER — cetirizine (ZYRTEC) tablet 10 mg
10 | ORAL | Status: AC
Start: 2017-04-26 — End: 2017-04-26
  Administered 2017-04-26: 14:00:00 10 mg via ORAL

## 2017-04-26 MED FILL — DIPHENHYDRAMINE 50 MG/ML INJECTION SOLUTION: 50 50 mg/mL | INTRAMUSCULAR | Qty: 1

## 2017-04-26 NOTE — Progress Notes (Signed)
Pt okay to discharge to home after receiving benadryl and reaction has subsided, vital signs stable. Discharge instructions given and reviewed with patient. All questions answered. CMU and IV removed per protocol. Prescriptions given to patient and all belongings sent home with pt. Will continue to monitor until discharge via wheelchair.

## 2017-04-26 NOTE — Progress Notes (Signed)
[facility] - Department of Pharmacy Services:   Patients Home Medication Identification    Request     Medication and dose strength: Zyrtec (cetirizine) 10 mg  Dosage Form: Tablet  Shape: Round  Color: White  Is product a hazardous medication? No  Is product tampered with or damaged? yes - Patient provided medication from pill box; patient identified medication confirmed on Lexicomp.   Reason for Patient Own Medication Usage: allergies/intolerances to dyes    Medication and dose strength: Zantac (rantidine) 300 mg  Dosage Form: Tablet  Shape: Oblong  Color: White  Imprint: 430  Is product a hazardous medication? No  Is product tampered with or damaged? yes - Patient provided medication from pill box; patient identified medication confirmed on Lexicomp.  Reason for Patient Own Medication Usage: allergies/intolerances to dyes    Patient acutely asking for these medications due to the fight or flight kicking in and having rash on chest. Patient has additional medications in the room which she would usually take, but states she will take these once she gets home today.    Labels procured from central pharmacy and given to bedside nurse.     Althea Grimmer, PharmD  Clinical Pharmacy Specialist  Pager: [phone]  On-Call/Weekend Pager: [phone]  04/26/17  10:23 AM

## 2017-04-26 NOTE — Progress Notes (Signed)
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 While trying to obtain new orders patient took home medications without staff knowledge at 2130 (see MAR for medications and dosage). MD and pharmacy aware. MD to bedside to change orders to home supplied medication, but pharmacy unable to verify at this time d/t medications not in prescribed bottle. Nursing supervisor updated. Pt educated on future admissions to bring bottles of home medications

## 2017-04-26 NOTE — Care Coordination-Inpatient (Signed)
[facility]  Care Management Department    High Risk Screen    Name: Emily Bonilla  MRN:  [id number]  Date:   04/26/2017     High Risk Screen  Patient admitted from nursing home, group home or rehab facility: No  Patient is over 56 years of age and lives alone: No  Patient is active with Hospice services: No  Patient is suspected victim of abuse, neglect, violence: No  Current alcohol or drug abuse: No  Patient is homeless: No  Patient is from justice center or on police hold: No  Patient has a new diagnosis of a terminal illness: No  Patient has a new diagnosis of a chronic illness: No  Patient has multiple co-morbidities and/or>5 home medications: Yes  Patient has no PCP or medical home: No  Patient has history of dementia, mental illness or DD: No  Patient has no payer source: No  Patient has previous admission within last 30 days: No  Anticipate specialized home health needs, i.e., wounds, trach, cellulitis, ostomy, tube-feeds, TPN, DME: No  Score: 2    Assignment of Risk Level    Patient has been screened by [facility] and meets Moderate Risk Indicators, psychosocial assessment to follow. (Score of 2-4)      SW to follow.    [place], LISW  [phone]

## 2017-04-26 NOTE — Discharge Summary (Signed)
[facility]  Department of Internal Medicine  Inpatient Discharge Summary    Patient: Emily Bonilla   MRN: [id number]   CSN: [id number]    Date of Admission: 04/25/2017  Date of Discharge: 04/26/2017  Attending Physician: Maeola Harman, MD      Diagnoses Present on Admission     Past Medical History:   Diagnosis Date    Anaphylaxis     Anemia     Anxiety     Asthma     Atrial fibrillation (CMS Dx)     Bronchitis     Heart disease     Neurological disease     OSA (obstructive sleep apnea)     states resolved    Osteoporosis     Psychogenic nonepileptic seizure     Seizures (CMS Dx)     Spontaneous pneumothorax     states pneumomediatsium/pneumopericardium, self resolved    Thyroid disease     Ulcer     WPW (Wolff-Parkinson-White syndrome)           Discharge Diagnoses     Active Hospital Problems    Diagnosis Date Noted    Caries [K02.9] 04/08/2017      Resolved Hospital Problems    Diagnosis Date Noted Date Resolved   No resolved problems to display.         Operations/Procedures Performed (include dates)     Surgeries:  Surgical/Procedural Cases on this Admission     Case IDs Date Procedure Surgeon Location Status    [id number] 04/25/17 EXTRACTION TOOTH # 31, BRIDGE RESECTIONING Riddhi Allena Katz, DMD [facility]            Lines/Drains/Airways:  Patient Lines/Drains/Airways Status    Active Line / PIV Line     None                  Notable Imaging Studies:  None     Other Procedures:  None       Consulting Services (include reason)     1. Oral Surgery: tooth extraction       Allergies     Acetaminophen  Aspirin  Blue Dye  Epinephrine  Flecainide  Gabapentin  Iodinated Contrast- Oral And Iv Dye  Isopropyl Alcohol  Latex  Lidocaine  Nsaids (Non-Steroidal Anti-Inflammatory Drug)  Onion  Other  Perfume  Pregabalin  Quetiapine  Red Dye  Sulfur  Yellow Dye  Amitriptyline  Doxepin  Duloxetine  Epi E-Z Pen  Hydroxyzine  Levofloxacin  Montelukast  Verapamil (Bulk)  Adhesive  Tape-Silicones  Trazodone      Discharge Medications        Medication List      TAKE these medication, which have CHANGED      Quantity/Refills   predniSONE 20 MG tablet  Commonly known as:  DELTASONE  TAKE ONE TABLET BY MOUTH DAILY.  What changed:  Another medication with the same name was removed. Continue taking this medication, and follow the directions you see here.   Quantity:  30 tablet  Refills:  0        TAKE these medications, which you were ALREADY TAKING      Quantity/Refills   BENADRYL 25 mg capsule  Generic drug:  diphenhydrAMINE  Take 75-150 mg by mouth daily.   Refills:  0     busPIRone 10 MG tablet  Commonly known as:  BUSPAR  Take 2 tablets (20 mg total) by mouth 3 times a day. Indications: Generalized Anxiety  Disorder   Quantity:  180 tablet  For:  Generalized Anxiety Disorder  Refills:  2     doxepin 10 MG capsule  Commonly known as:  SINEQUAN  Take 1 capsule (10 mg total) by mouth at bedtime.   Quantity:  90 capsule  Refills:  3     fexofenadine 180 MG tablet  Commonly known as:  ALLEGRA  Take 1 tablet (180 mg total) by mouth daily.   Quantity:  30 tablet  Refills:  3     furosemide 20 MG tablet  Commonly known as:  LASIX  Take 1 tablet (20 mg total) by mouth daily.   Quantity:  90 tablet  Refills:  3     hydrOXYzine HCl 25 MG tablet  Commonly known as:  ATARAX  Take 1 tablet (25 mg total) by mouth 3 times a day as needed for Anxiety. Indications: anxiety   Quantity:  90 tablet  For:  anxiety  Refills:  2     levalbuterol 1.25 mg/3 mL nebulizer solution  Commonly known as:  XOPENEX  Inhale 3 mL (1.25 mg total) by nebulization every 6 hours as needed for Wheezing.   Quantity:  72 mL  Refills:  0     levothyroxine 112 MCG tablet  Commonly known as:  SYNTHROID, LEVOTHROID  Take one tablet by mouth once daily   Quantity:  90 tablet  Refills:  3     montelukast 10 mg tablet  Commonly known as:  SINGULAIR  TAKE ONE TABLET BY MOUTH AT BEDTIME.   Quantity:  30 tablet  Refills:  0     nebulizer and  compressor aerosol device  Use 1 ampule as directed every 4 hours. Indications: Please dispense 1 nebulizer unit   Quantity:  1 each  For:  Please dispense 1 nebulizer unit  Refills:  0     omeprazole 40 MG capsule  Commonly known as:  PRILOSEC  Take 40 mg by mouth.   Refills:  0     onabotulinumtoxinA 50 unit Solr  Commonly known as:  BOTOX COSMETIC  Inject into the muscle.   Refills:  0     ranitidine 300 MG tablet  Commonly known as:  ZANTAC  Take 1 tablet (300 mg total) by mouth 3 times a day.   Quantity:  90 tablet  Refills:  11     triamcinolone 0.1 % ointment  Commonly known as:  KENALOG  APPLY 2 TIMES DAILY   Quantity:  80 g  Refills:  11              Reason for Admission     Emily Bonilla is a 57 y.o. female with PMH of asthma, mast cell activation syndrome, CVID, vocal cord dysfunction, atrial fibrillation, WPW, anxiety disorder, hypothyroidismwho presented with elective tooth extraction and bridge removal. She was admitted under the medicine team due to management of multiple medical problems       Hospital Course By Problem     1. Dental caries   Oral surgery was consulted and on 04/25/2017 underwent a simple tooth extraction of tooth #31 and sectioning of fixed dental prosthesis, posterior right mandible. She tolerated the procedure well. Per oral surgery no need for follow-up outpatient.     2. h/o mast cell activation syndrome  The most recent allergy and immunology office note, reviewed her prior workup and bloodwork and do not support this diagnosis, but she reports that she has been treated by multiple allergists in  the past 5 years and revealed photographs of the reactions she has experienced. Only some of her medications could be continued during hospitalization as she did not have the actually medications in prescription bottles and all were in a medi-set container and only comfortable taking her own medications due to risk of reactions. The day of discharge she her cheeks and anterior chest  were slightly flush. She improved after benadryl IV and was discharged safely home with continuation of her home medications     3. Asthma   Well-controlled and has not required inhalers in years. Continued on home medications at discharge     4. 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 Not being actively managed    5. Psychogenic nonepileptic seizures   Recent diagnoses by neurology. No issues this hospitalization       Condition on Discharge     1. Functional Status: Normal    2. Mental Status: Normal    3. Diet / Tube Feeding / TPN:  Diet Orders          Diet regular starting at 04/08 1120        As listed above    4. Respiratory / Lines & Tubes / Wounds:  None required    5. Discharge Physical Exam:  BP 139/77 (BP Location: Right arm, Patient Position: Lying)   Pulse 74   Temp 97.9 F (36.6 C) (Oral)   Resp 18   Ht 5' 7 (1.702 m)   Wt 212 lb (96.2 kg)   LMP  (LMP Unknown)   SpO2 99%   BMI 33.20 kg/m    BP 139/77 (BP Location: Right arm, Patient Position: Lying)   Pulse 74   Temp 97.9 F (36.6 C) (Oral)   Resp 18   Ht 5' 7 (1.702 m)   Wt 212 lb (96.2 kg)   LMP  (LMP Unknown)   SpO2 99%   BMI 33.20 kg/m     General Appearance:  Alert, cooperative, no distress, appears stated age   Head:  Normocephalic, without obvious abnormality, atraumatic   Eyes:  PERRL, conjunctiva/corneas clear, EOM's intact, fundi benign, both eyes   Ears:  Normal TM's and external ear canals, both ears   Nose: Nares normal, septum midline,mucosa normal, no drainage or sinus tenderness   Throat: Lips, mucosa, and tongue normal; teeth and gums normal   Neck: Supple, symmetrical, trachea midline, no adenopathy;  thyroid: not enlarged, symmetric, no tenderness/mass/nodules; no carotid bruit or JVD   Back:   Symmetric, no curvature, ROM normal, no CVA tenderness   Lungs:   Clear to auscultation bilaterally, respirations unlabored   Breasts:  No masses or tenderness   Heart:  Regular rate and rhythm, S1 and  S2 normal, no murmur, rub, or gallop   Abdomen:   Soft, non-tender, bowel sounds active all four quadrants,  no masses, no organomegaly   Pelvic: Deferred   Extremities: Extremities normal, atraumatic, no cyanosis or edema   Pulses: 2+ and symmetric   Skin: Skin color, texture, turgor normal, no rashes or lesions   Lymph nodes: Cervical, supraclavicular, and axillary nodes normal   Neurologic: Normal             Disposition     Home independent      Follow-Up Appointments     Future Appointments  Date Time Provider Department Center   05/03/2017 12:00 PM Biagio Borg, Ph.D. [facility]   05/10/2017 10:00 AM Biagio Borg,  Ph.D. [place] [facility]   06/02/2017 9:20 AM Casimer Leek [place], DO [facility]   06/07/2017 10:30 AM Karlene Lineman, MD [facility]       No follow-up provider specified.          Total time with discharge planning: >30 minutes     Signed:    Derry Skill, CNP   04/26/2017, 12:36 PM

## 2017-04-26 NOTE — Unmapped (Signed)
Anesthesia Post Note    Patient: Emily Bonilla    Procedure(s) Performed: Procedure(s):  EXTRACTION TOOTH # 31, BRIDGE RESECTIONING    Anesthesia type: general    Patient location: Med Surgical Floor    Post pain: Adequate analgesia    Post assessment: no apparent anesthetic complications    Last Vitals:   Vitals:    04/25/17 1644 04/25/17 2200 04/26/17 0400 04/26/17 0825   BP: 151/83 150/90 121/54 139/77   BP Location: Right arm Right arm Right arm Right arm   Patient Position: Sitting Sitting Lying Lying   Pulse: 105 97 78 74   Resp: 18 18 18 18    Temp: 97.9 ??F (36.6 ??C) 97.5 ??F (36.4 ??C) 97.8 ??F (36.6 ??C) 97.9 ??F (36.6 ??C)   TempSrc: Oral Oral Oral Oral   SpO2:  96% 94% 99%   Weight:       Height:            Post vital signs: stable    Level of consciousness: awake, alert  and oriented    Complications: None

## 2017-04-26 NOTE — Unmapped (Signed)
[facility] - Department of Pharmacy Services:   Patient???s Home Medication Identification    Request     Medication and dose strength: Benadryl (diphenhydramine) 25 mg  Dosage Form: Tablet  Shape: Oblong  Color: Pink  Is product a hazardous medication? No  Is product tampered with or damaged? yes - Patient provided medication from pill box; patient identified medication confirmed on Lexicomp.  Reason for Patient Own Medication Usage: allergies/intolerances to dyes    Medication and dose strength: Atarax (hydroxyzine) 25 mg  Dosage Form: Tablet  Shape: Round  Color: White  Imprint: 308  Is product a hazardous medication? No  Is product tampered with or damaged? yes - Patient provided medication from pill box; patient identified medication confirmed on Lexicomp.  Reason for Patient Own Medication Usage: allergies/intolerances to dyes    Medication and dose strength: Buspirone (Buspar) 10 mg  Dosage Form: Tablet  Shape: Oblong  Color: White  Imprint: 10  Is product a hazardous medication? No  Is product tampered with or damaged? yes - Patient provided medication from pill box; patient identified medication confirmed on Lexicomp.  Reason for Patient Own Medication Usage: allergies/intolerances to dyes    Medication and dose strength: Levothyroxine   Dosage Form: Tablet  Shape: Oblong  Color: Pink  Imprint: GG336  Is product a hazardous medication? No  Is product tampered with or damaged? yes - Patient provided medication from pill box; patient identified medication confirmed on Lexicomp.  Reason for Patient Own Medication Usage: allergies/intolerances to dyes  ??  Medication and dose strength: Pepcid (famotidine) 20 mg  Dosage Form: Tablet  Shape: Round  Color: White  Imprint: L194  If a controlled substance, initial inventory (number of doses): N/A  Is product a hazardous medication? No  Is product tampered with or damaged? yes - loose tablets; patient provided name and confirmed with Lexicomp.   Reason for Patient  Own Medication Usage: Patient intolerant to dyes    Patient would states she is ok waiting until she gets home to take these medications. Team (NP Carollee Herter) aware to contact pharmacy should these medications be acutely needed.    Althea Grimmer, PharmD  Clinical Pharmacy Specialist  Pager: [phone]  On-Call/Weekend Pager: [phone]  04/26/17  10:31 AM

## 2017-04-26 NOTE — Unmapped (Addendum)
[facility] was advised by SW Normajean Baxter that patient may be in need of an IMM since she's discharge ready. Patient signed one yesterday 04/25/2017 at 6:48:49 AM. No IMM needed at this time.      Fonnie Mu  Care Coordinator Assistant  Care Management Services

## 2017-05-03 ENCOUNTER — Ambulatory Visit: Admit: 2017-05-03 | Payer: PRIVATE HEALTH INSURANCE

## 2017-05-03 DIAGNOSIS — F4323 Adjustment disorder with mixed anxiety and depressed mood: Secondary | ICD-10-CM

## 2017-05-03 NOTE — Unmapped (Signed)
In: 12pm  Out: ??1255pm  ??  Chief Issue: stress/anxiety about health??  ??  Subjective:   Pt discussed additional symptoms that she has from her disordered health. ??oral surgery was ultimately successful, but there were difficulties along the way.  Her aide has continued to be a source of stress for her, not staying with her and paying attention to her needs as she should. This has been very stressful to her and will likely find someone new after next week or so.  Continues to try to be optimistic and hopeful.  Discussed plans that she could make to look forward to.  Recognizing how lonely she is becoming.  Discussed getting involved with church and other volunteer activities potentially, but also knows that she can't overly commit to anything.  If she isn't feeling well, she'll need to cancel.   Has been trying to keep stress levels low in general.      ????Trying to continue to be optimistic, hopeful. ????  Objective:   Very optimistic and positive on the outside especially with what she is managing health and financially.  MSE  Motor Behavior:??no psychomotor abnormalities  ??  Cognition: alert  ??  Attitude: cooperative  ??  Affect: sad, tearful, appropriate to thought and anxious  ??  Appearance: appropriately dressed  ??  Speech: normal rate  ??  Mood:??depressed, consistent and anxious  ??  Thought Processes:??well organized and no looseness of association??  ??  Perceptions:??no hallucinations or other abnormalities????  ??  Thought content:??no delusions, low self-esteem and demoralized  ??  Insight/ judgement:??no impairments  ??  Suicidal ideation:??none  ??  Homicidal ideation:??none  ??  Symptoms observed during session:  Agitation,??anhedonia,??anxiety/fear,????decreased energy/fatigue, mildly??depressed,??emotional lability, sleep disturbance, restlessness,??sad/pained/worried expression,Open coachable motivated  ??  ??  Assessment:   ??  Intervention strategies implemented and session focus was cognitive  behavioral,??insight-oriented,??behavior modification,??supportive,??biofeedback,??mindfulness strategies  ??  Plan:   ??  Homework:   Practice breathing techniques  Continuing physical exercise  Follow bedtime routine with consistency  Continue to seek out groups that she can potentially get involved in  ??  Future treatment/follow-up issues:  ??two weeks.  Also, refer to Dr. Valeda Malm

## 2017-05-05 ENCOUNTER — Ambulatory Visit: Payer: PRIVATE HEALTH INSURANCE

## 2017-05-09 MED ORDER — montelukast (SINGULAIR) 10 mg tablet
10 | ORAL_TABLET | ORAL | 0 refills | Status: AC
Start: 2017-05-09 — End: 2017-07-07

## 2017-05-09 MED ORDER — levothyroxine (SYNTHROID, LEVOTHROID) 112 MCG tablet
112 | ORAL_TABLET | Freq: Every day | ORAL | 3 refills | Status: AC
Start: 2017-05-09 — End: 2017-06-29

## 2017-05-10 ENCOUNTER — Ambulatory Visit: Admit: 2017-05-10 | Payer: PRIVATE HEALTH INSURANCE

## 2017-05-10 DIAGNOSIS — F4323 Adjustment disorder with mixed anxiety and depressed mood: Secondary | ICD-10-CM

## 2017-05-10 NOTE — Unmapped (Signed)
In: 10am  Out: ??1055am  ??  Chief Issue: stress/anxiety about health??  ??  Subjective:   Pt discussed additional symptoms that she has from her disordered health.  Continuing to manage with her oral health from surgery.  She had another issue with her aide and fired her yesterday.   Has a new one today that seems promising.  The aide situation has been very stressful to her and isn't helping her situation.  Reviewed stress management techniques and talked about her swelling.  Practiced a relaxation exercise today in session and felt much more relaxed and reset.  Lip was swollen, as well as stomach.  Discussed inflammation and put in a referral to Dr. Valeda Malm as well who may be able to assist her.  ??????Trying to continue to be optimistic, hopeful. ????  Objective:   Very optimistic and positive on the outside especially with what she is managing health and financially.  MSE  Motor Behavior:??no psychomotor abnormalities  ??  Cognition: alert  ??  Attitude: cooperative  ??  Affect: sad, tearful, appropriate to thought and anxious  ??  Appearance: appropriately dressed  ??  Speech: normal rate  ??  Mood:??depressed, consistent and anxious  ??  Thought Processes:??well organized and no looseness of association??  ??  Perceptions:??no hallucinations or other abnormalities????  ??  Thought content:??no delusions, low self-esteem and demoralized  ??  Insight/ judgement:??no impairments  ??  Suicidal ideation:??none  ??  Homicidal ideation:??none  ??  Symptoms observed during session:  Agitation,??anhedonia,??anxiety/fear,????decreased energy/fatigue, mildly??depressed,??emotional lability, sleep disturbance, restlessness,??sad/pained/worried expression,Open coachable motivated  ??  ??  Assessment:   ??  Intervention strategies implemented and session focus was cognitive behavioral,??insight-oriented,??behavior modification,??supportive,??mindfulness strategies    Practiced passive muscle relaxation exercise.  ??  Plan:   ??  Homework:   Practice breathing  techniques  Continuing physical exercise  Follow bedtime routine with consistency  Continue to seek out groups that she can potentially get involved in  ??  Future treatment/follow-up issues:  ??two weeks.  Also, refer to Dr. Valeda Malm

## 2017-05-25 ENCOUNTER — Ambulatory Visit: Admit: 2017-05-25 | Payer: PRIVATE HEALTH INSURANCE

## 2017-05-25 DIAGNOSIS — F4323 Adjustment disorder with mixed anxiety and depressed mood: Secondary | ICD-10-CM

## 2017-05-25 NOTE — Unmapped (Signed)
In: 11am  Out: ??1155am  ??  Chief Issue: stress/anxiety about health??  ??  Subjective:   Pt discussed additional symptoms that she has from her disordered health.   Continuing to manage with her oral health from surgery.  She had another issue with her aide and fired her yesterday.   Has a new one today that seems promising.  The aide situation has been very stressful to her and isn't helping her situation.  Reviewed stress management techniques and talked about her swelling.  Discussed that she ultimately needs to find a strategy to cope with the brain fog that occurs when having anaphalactic issues.   Lip and stomach are swollen.  Feels very upset about what's happening with her body, but not discouraged.  Assistant is working out fairly well.  Brother didn't come to visit as he said he would, so was let down.  Will be seeing Dr. Valeda Malm 5/23.   ??????Trying to continue to be optimistic, hopeful. ????  Objective:   Very optimistic and positive on the outside especially with what she is managing health and financially.  MSE  Motor Behavior:??no psychomotor abnormalities  ??  Cognition: alert  ??  Attitude: cooperative  ??  Affect: sad, tearful, appropriate to thought and anxious  ??  Appearance: appropriately dressed  ??  Speech: normal rate  ??  Mood:??depressed, consistent and anxious  ??  Thought Processes:??well organized and no looseness of association??  ??  Perceptions:??no hallucinations or other abnormalities????  ??  Thought content:??no delusions, low self-esteem and demoralized  ??  Insight/ judgement:??no impairments  ??  Suicidal ideation:??none  ??  Homicidal ideation:??none  ??  Symptoms observed during session:  Agitation,??anhedonia,??anxiety/fear,????decreased energy/fatigue, mildly??depressed,??emotional lability, sleep disturbance, restlessness,??sad/pained/worried expression,Open coachable motivated  ??  ??  Assessment:   ??  Intervention strategies implemented and session focus was cognitive behavioral,??insight-oriented,??behavior  modification,??supportive,??mindfulness strategies  ??  ??  Plan:   ??  Homework:   Practice breathing techniques  Continuing physical exercise  Follow bedtime routine with consistency  Continue to seek out groups that she can potentially get involved in  ??  Future treatment/follow-up issues:  ??two weeks.    Will practice on biofeedback at next visit

## 2017-05-31 ENCOUNTER — Inpatient Hospital Stay
Admit: 2017-05-31 | Discharge: 2017-05-31 | Disposition: A | Discharge status: Home / Self Care | Payer: PRIVATE HEALTH INSURANCE

## 2017-05-31 ENCOUNTER — Inpatient Hospital Stay: Admit: 2017-05-31 | Payer: PRIVATE HEALTH INSURANCE

## 2017-05-31 DIAGNOSIS — T7849XA Other allergy, initial encounter: Secondary | ICD-10-CM

## 2017-05-31 DIAGNOSIS — Z1231 Encounter for screening mammogram for malignant neoplasm of breast: Secondary | ICD-10-CM

## 2017-05-31 LAB — VENOUS BLOOD GAS, LINE/SYRINGE
%HBO2-Line Draw: 47.9 % (ref 40.0–70.0)
Base Excess-Line Draw: 3.6 mmol/L (ref <=3.0)
CO2 Content-Line Draw: 30 mmol/L (ref 25–29)
Carboxyhgb-Line Draw: 1 % (ref 0.0–2.0)
HCO3-Line Draw: 29 mmol/L (ref 24–28)
Methemoglobin-Line Draw: 0.6 % (ref 0.0–1.5)
PCO2-Line Draw: 46 mm Hg (ref 41–51)
PH-Line Draw: 7.41 (ref 7.32–7.42)
PO2-Line Draw: 26 mm Hg (ref 25–40)
Reduced Hemoglobin-Line Draw: 50.5 % (ref 0.0–5.0)

## 2017-05-31 LAB — BASIC METABOLIC PANEL
Anion Gap: 12 mmol/L (ref 3–16)
BUN: 10 mg/dL (ref 7–25)
CO2: 27 mmol/L (ref 21–33)
Calcium: 9.4 mg/dL (ref 8.6–10.3)
Chloride: 102 mmol/L (ref 98–110)
Creatinine: 0.84 mg/dL (ref 0.60–1.30)
Glucose: 110 mg/dL (ref 70–100)
Osmolality, Calculated: 292 mOsm/kg (ref 278–305)
Potassium: 4.3 mmol/L (ref 3.5–5.3)
Sodium: 141 mmol/L (ref 133–146)
eGFR AA CKD-EPI: >90 See note.
eGFR NONAA CKD-EPI: 78 See note.

## 2017-05-31 LAB — DIFFERENTIAL
Basophils Absolute: 50 /uL (ref 0–200)
Basophils Relative: 0.4 % (ref 0.0–1.0)
Eosinophils Absolute: 139 /uL (ref 15–500)
Eosinophils Relative: 1.1 % (ref 0.0–8.0)
Lymphocytes Absolute: 3490 /uL (ref 850–3900)
Lymphocytes Relative: 27.7 % (ref 15.0–45.0)
Monocytes Absolute: 1399 /uL (ref 200–950)
Monocytes Relative: 11.1 % (ref 0.0–12.0)
Neutrophils Absolute: 7522 /uL (ref 1500–7800)
Neutrophils Relative: 59.7 % (ref 40.0–80.0)
nRBC: 0 /100 WBC (ref 0–0)

## 2017-05-31 LAB — SED RATE: Sed Rate: 29 mm/hr (ref 0–30)

## 2017-05-31 LAB — CBC
Hematocrit: 45.2 % (ref 35.0–45.0)
Hemoglobin: 14.8 g/dL (ref 11.7–15.5)
MCH: 27.9 pg (ref 27.0–33.0)
MCHC: 32.9 g/dL (ref 32.0–36.0)
MCV: 84.9 fL (ref 80.0–100.0)
MPV: 8.3 fL (ref 7.5–11.5)
Platelets: 317 10*3/uL (ref 140–400)
RBC: 5.32 10*6/uL (ref 3.80–5.10)
RDW: 14.6 % (ref 11.0–15.0)
WBC: 12.6 10*3/uL (ref 3.8–10.8)

## 2017-05-31 LAB — CK: Total CK: 108 U/L (ref 30–223)

## 2017-05-31 MED ORDER — diphenhydrAMINE (BENADRYL) injection 25 mg
50 | Freq: Once | INTRAMUSCULAR | Status: AC
Start: 2017-05-31 — End: 2017-05-31
  Administered 2017-05-31: 15:00:00 25 mg via INTRAVENOUS

## 2017-05-31 MED ORDER — diphenhydrAMINE (BENADRYL) injection 25 mg
50 | Freq: Once | INTRAMUSCULAR | Status: AC
Start: 2017-05-31 — End: 2017-05-31

## 2017-05-31 MED ORDER — famotidine (PF) (PEPCID) injection 20 mg
20 | Freq: Two times a day (BID) | INTRAVENOUS | Status: AC
Start: 2017-05-31 — End: 2017-05-31

## 2017-05-31 MED ORDER — diphenhydrAMINE (BENADRYL) 50 mg/mL injection
50 | INTRAMUSCULAR | Status: AC
Start: 2017-05-31 — End: 2017-05-31
  Administered 2017-05-31: 15:00:00 25 via INTRAVENOUS

## 2017-05-31 MED ORDER — diphenhydrAMINE (BENADRYL) 50 mg/mL injection
50 | INTRAMUSCULAR | Status: AC
Start: 2017-05-31 — End: ?

## 2017-05-31 MED ORDER — methylPREDNISolone sod suc(PF) (SOLU-medrol) SolR 125 mg
125 | Freq: Once | INTRAMUSCULAR | Status: AC
Start: 2017-05-31 — End: 2017-05-31
  Administered 2017-05-31: 15:00:00 125 mg via INTRAVENOUS

## 2017-05-31 MED ORDER — diphenhydrAMINE (BENADRYL) injection 25 mg
50 | Freq: Once | INTRAMUSCULAR | Status: AC
Start: 2017-05-31 — End: 2017-05-31
  Administered 2017-05-31: 16:00:00 25 mg via INTRAVENOUS

## 2017-05-31 MED ORDER — famotidine (PF) (PEPCID) injection 40 mg
20 | Freq: Two times a day (BID) | INTRAVENOUS | Status: AC
Start: 2017-05-31 — End: 2017-05-31
  Administered 2017-05-31: 15:00:00 40 mg via INTRAVENOUS

## 2017-05-31 MED FILL — DIPHENHYDRAMINE 50 MG/ML INJECTION SOLUTION: 50 50 mg/mL | INTRAMUSCULAR | Qty: 1

## 2017-05-31 MED FILL — SOLU-MEDROL (PF) 125 MG/2 ML SOLUTION FOR INJECTION: 125 125 mg/2 mL | INTRAMUSCULAR | Qty: 2

## 2017-05-31 MED FILL — FAMOTIDINE (PF) 20 MG/2 ML INTRAVENOUS SOLUTION: 20 20 mg/2 mL | INTRAVENOUS | Qty: 4

## 2017-05-31 NOTE — ED Provider Notes (Signed)
ED Attending Attestation Note    Date of service:  05/31/2017    This patient was seen by the resident physician.  I have seen and examined the patient, agree with the workup, evaluation, management and diagnosis. The care plan has been discussed and I concur.  I have reviewed the ECG and concur with the resident's interpretation.    My assessment reveals a 57 y.o. 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  On exam she has no angioedema no wheezing noted

## 2017-05-31 NOTE — Unmapped (Signed)
Please see her doctor on Thursday as you are scheduled.  Please come back to emergency department if you experience any worsening symptoms and are concerned you are having a severe reaction.

## 2017-05-31 NOTE — ED Triage Notes (Signed)
Pt has a mass cell disorder and is having an allergic reaction. PT is allergic to epi states she went into cardiac arrest the last time she was given it. Pt states her tongue feels like it is swelling.

## 2017-05-31 NOTE — ED Provider Notes (Signed)
[facility] ED Note    Date of service:  05/31/2017    Reason for Visit: Allergic Reaction    Patient History     HPI:  This is a 57 y.o. female with a history as noted below presenting with allergic reaction.  History is obtained from the patient.     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  She reports that she has had severe reactions in the past, shows me images on her phone of facial swelling and prior reactions for which she required 275 mg of IV Benadryl to break it.  She tells me that today it was cold in the house and she thinks that's what began to trigger reaction today, she went for an outpatient mammogram and while there had a smell of perfume.  At this point, she began to have progressive abdominal distention as well as itching but no rash.  No trouble breathing.  She was concern for reaction and states that she needs IV Benadryl and everything to get under control quickly before she goes over the hump and things get worse.  Otherwise has been in her normal state of health.  Cannot get IM epinephrine for these episodes because it caused my heart to stop in an ambulance once.      With the exception of the above, there are no other exacerbating, alleviating, or associated factors.      Past Medical History:   Diagnosis Date    Anaphylaxis     Anemia     Anxiety     Asthma     Atrial fibrillation (CMS Dx)     Bronchitis     Heart disease     Neurological disease     OSA (obstructive sleep apnea)     states resolved    Osteoporosis     Psychogenic nonepileptic seizure     Seizures (CMS Dx)     Spontaneous pneumothorax     states pneumomediatsium/pneumopericardium, self resolved    Thyroid disease     Ulcer     WPW (Wolff-Parkinson-White syndrome)        Past Surgical History:   Procedure Laterality Date    BONE MARROW BIOPSY      benign    CARDIAC ELECTROPHYSIOLOGY MAPPING AND  ABLATION      attempted x 2, states not successful either time    CHOLECYSTECTOMY      DENTAL EXTRACTION Right 04/25/2017    Procedure: EXTRACTION TOOTH # 31, BRIDGE RESECTIONING;  Surgeon: Gladys Damme, DMD;  Location: [facility] OR;  Service: Oral Surgery;  Laterality: Right;    FACIAL COSMETIC SURGERY      HYSTERECTOMY      LIVER BIOPSY      benign hepatic adednoma per patient    REMOVAL HARDWARE LEG Left 08/17/2016    Procedure: REMOVAL OF HARDWARE LEFT TIBIA;  Surgeon: Andee Lineman, MD;  Location: [facility] OR;  Service: Orthopedics;  Laterality: Left;    TIBIA FRACTURE SURGERY      TONSILLECTOMY      + adenoids       Nykeisha Pella  reports that she has never smoked. She has never used smokeless tobacco. She reports that she does not drink alcohol or use drugs.    Discharge Medication List as of 05/31/2017 12:55 PM      CONTINUE these medications which have NOT CHANGED  Details   busPIRone (BUSPAR) 10 MG tablet Take 2 tablets (20 mg total) by mouth 3 times a day. Indications: Generalized Anxiety Disorder, Starting Thu 04/14/2017, Normal, Disp-180 tablet, R-2      diphenhydrAMINE (BENADRYL) 25 mg capsule Take 75-150 mg by mouth daily., Historical Med      doxepin (SINEQUAN) 10 MG capsule Take 1 capsule (10 mg total) by mouth at bedtime., Starting Thu 09/16/2016, Normal, Disp-90 capsule, R-3      fexofenadine (ALLEGRA) 180 MG tablet Take 1 tablet (180 mg total) by mouth daily., Starting Thu 09/16/2016, Normal, Disp-30 tablet, R-3      furosemide (LASIX) 20 MG tablet Take 1 tablet (20 mg total) by mouth daily., Starting Mon 04/11/2017, Normal, Disp-90 tablet, R-3      hydrOXYzine HCl (ATARAX) 25 MG tablet Take 1 tablet (25 mg total) by mouth 3 times a day as needed for Anxiety. Indications: anxiety, Starting Thu 04/14/2017, Normal, Disp-90 tablet, R-2      levalbuterol (XOPENEX) 1.25 mg/3 mL nebulizer solution Inhale 3 mL (1.25 mg total) by nebulization every 6 hours as needed for Wheezing., Starting Thu 09/16/2016,  Normal, Disp-72 mL, R-0      levothyroxine (SYNTHROID, LEVOTHROID) 112 MCG tablet Take 1 tablet (112 mcg total) by mouth daily., Starting Mon 05/09/2017, Normal, Disp-90 tablet, R-3      montelukast (SINGULAIR) 10 mg tablet TAKE ONE TABLET BY MOUTH AT BEDTIME., Normal, Disp-30 tablet, R-0      nebulizer and compressor Devi Use 1 ampule as directed every 4 hours. Indications: Please dispense 1 nebulizer unit, Starting Thu 09/16/2016, Normal, Disp-1 each, R-0      omeprazole (PRILOSEC) 40 MG capsule Take 40 mg by mouth., Historical Med      onabotulinumtoxinA (BOTOX COSMETIC) 50 unit SolR Inject into the muscle., Historical Med      predniSONE (DELTASONE) 20 MG tablet TAKE ONE TABLET BY MOUTH DAILY., Normal, Disp-30 tablet, R-0      ranitidine (ZANTAC) 300 MG tablet Take 1 tablet (300 mg total) by mouth 3 times a day., Starting Thu 09/16/2016, Normal, Disp-90 tablet, R-11      triamcinolone (KENALOG) 0.1 % ointment APPLY 2 TIMES DAILY, Normal, Disp-80 g, R-11             Allergies:   Allergies as of 05/31/2017 - Fully Reviewed 05/31/2017   Allergen Reaction Noted    Acetaminophen Anaphylaxis 11/17/2015    Aspirin Anaphylaxis 09/27/2015    Blue dye Anaphylaxis 11/17/2015    Epinephrine Anaphylaxis 11/25/2015    Flecainide Shortness Of Breath 02/06/2016    Gabapentin Anaphylaxis 11/17/2015    Iodinated contrast- oral and iv dye Anaphylaxis 11/07/2015    Isopropyl alcohol Anaphylaxis 11/07/2015    Latex Anaphylaxis and Rash 09/27/2015    Lidocaine Anaphylaxis 09/27/2015    Nsaids (non-steroidal anti-inflammatory drug) Anaphylaxis 11/07/2015    Onion Anaphylaxis 11/17/2015    Other Anaphylaxis 09/27/2015    Perfume Anaphylaxis 11/07/2015    Pregabalin Anaphylaxis 09/27/2015    Quetiapine Anaphylaxis 11/07/2015    Red dye Anaphylaxis 11/07/2015    Sulfur Anaphylaxis 11/25/2015    Yellow dye Anaphylaxis 11/07/2015    Amitriptyline  01/04/2017    Doxepin Other (See Comments) 11/17/2015    Duloxetine   11/17/2015    Epi e-z pen  01/19/1996    Hydroxyzine Itching 11/07/2015    Levofloxacin Itching 11/07/2015    Montelukast  11/07/2015    Verapamil (bulk) Hives 02/06/2016    Adhesive tape-silicones Itching, Rash, and Swelling 09/27/2015  Trazodone Rash 11/07/2015       Review of Systems     ROS:  The patient reports abdominal distention and itching and SOB as above.  Denies fever, chills, headache, neck pain, chest pain, vomiting, diarrhea.  All other systems in a full 10-point ROS were reviewed and were negative except as mentioned in HPI.    Physical Exam     ED Triage Vitals   Enc Vitals Group      BP 05/31/17 1112 (!) 157/96      Heart Rate 05/31/17 1112 94      Resp 05/31/17 1112 20      Temp 05/31/17 1112 98.2 F (36.8 C)      Temp Source 05/31/17 1112 Oral      SpO2 05/31/17 1112 100 %      Weight --       Height --       Head Circumference --       Peak Flow --       Pain Score 05/31/17 1050 Seven      Pain Loc --       Pain Edu? --       Excl. in GC? --      Please see the scanned SRU paper documentation for listing of vital signs.    Physical Exam    General:  Appears well nourished; well developed; in no apparent distress and not ill-appearing  HEENT:  Normocephalic, atraumatic; pupils equal, round and reactive; moist mucous membranes   Neck:  Neck supple, trachea midline, not stridulous   Pulmonary:  Lung sounds clear to auscultation bilaterally with good air entry; no respiratory distress; no wheezes or rales   Cardiovascular:  Regular rate and rhythm with no murmurs, rubs, or gallops; 2+ bilateral upper extremity peripheral pulses  Abdomen:  Soft, protuberant but non-distended, mildly tender to palpation throughout with no rebound or guarding  Musculoskeletal:  Atraumatic exam with no focal swelling or tenderness, no peripheral edema   Skin:  Warm and well perfused, no urticaria   Neuro:  Alert and oriented x 3, normal speech, moving all extremities without any focal deficits   Psych:   Appropriate mood and affect to the clinical situation     Diagnostic Studies   Labs:    Please see electronic medical record for any tests performed in the ED     Radiology:    Please see electronic medical record for any tests performed in the ED    EKG:  No EKG performed     Emergency Department Procedures     Procedures    No ED procedure performed     Medical Decision Making and ED Course     Mirlande Deschaine is a 57 y.o. female with a history and presentation as described above in HPI.  The patient was evaluated by myself and the ED Attending Physician, Dr. Glynis Smiles. All management and disposition plans were discussed and agreed upon. Nursing documentation reviewed.     The patient was triaged to SRU 3B on arrival.  On initial presentation, the patient was not ill-appearing, in no acute distress, with vital signs unremarkable for acute abnormalities.  The patient is concerned that she is having a mast cell degranulation episode, no signs of allergic reaction on exam.  Was treated with IV benadryl, Pepcid, and Solumedrol.  Basic screening labs were not remarkable for any acute abnormalities.  After these medications and a brief period of observation, the patient was at  her baseline and reported complete symptom resolution.  At this point, she was given strict return precautions and discharged home.  She has follow-up Thursday with allergy/immunology.  Strict return precautions given.  She was agreeable with this plan.      Medications received during this ED visit:  Medications   diphenhydrAMINE (BENADRYL) injection 25 mg (25 mg Intravenous Given 05/31/17 1056)   methylPREDNISolone sod suc(PF) (SOLU-medrol) SolR 125 mg (125 mg Intravenous Given 05/31/17 1056)   diphenhydrAMINE (BENADRYL) injection 25 mg (25 mg Intravenous Given 05/31/17 1110)   diphenhydrAMINE (BENADRYL) injection 25 mg (25 mg Intravenous Given 05/31/17 1132)       Impression     1. Allergic reaction, initial encounter       Plan     At this time  the patient has been deemed safe for discharge.  The patient is to be discharged home in stable/improved condition.  Workup, treatment and diagnosis were discussed with the patient and/or family members; the patient agrees to the plan and all questions were addressed and answered.  My customary discharge instructions including strict return precautions for worsening or new symptoms have been communicated.  The patient is agreeable to our plan.    Future Appointments  Date Time Provider Department Center   06/02/2017 9:20 AM Christena Flake, DO [facility]   06/07/2017 10:30 AM Karlene Lineman, MD [facility]   06/08/2017 11:00 AM Biagio Borg, Ph.D. [facility]   06/09/2017 2:00 PM Quentin Mulling, MD [facility]     Allister Lessley Malena Peer, MD, PGY-3  [facility]       Janeane Cozart Malena Peer, MD  Resident  06/01/17 [phone]

## 2017-06-01 ENCOUNTER — Ambulatory Visit: Payer: PRIVATE HEALTH INSURANCE

## 2017-06-02 ENCOUNTER — Ambulatory Visit: Admit: 2017-06-02 | Payer: PRIVATE HEALTH INSURANCE

## 2017-06-02 ENCOUNTER — Other Ambulatory Visit: Admit: 2017-06-02 | Payer: PRIVATE HEALTH INSURANCE

## 2017-06-02 DIAGNOSIS — D4709 Other mast cell neoplasms of uncertain behavior: Secondary | ICD-10-CM

## 2017-06-02 DIAGNOSIS — D839 Common variable immunodeficiency, unspecified: Secondary | ICD-10-CM

## 2017-06-02 LAB — IGG: IgG: 721 mg/dL — ABNORMAL LOW (ref 751.0–1560.0)

## 2017-06-02 NOTE — Unmapped (Addendum)
I saw and examined the patient.  I discussed with the resident or fellow and agree with Christena Flake, DO's findings and plan as documented in the note.    HPI: Pt is a 57 y/o female with anxiety, pseudoseizures, h/o CVID, and h/o mast cell activation syndrome here for follow up.  She did well with her dental procedure and has only had 1 ER visit related to a strong odor she smelling during her mammogram.  She continues on all her medications prescribed by outside Allergist (Dr. Vickie Epley) and is taking hydroxyzine 25mg  TID and doxepin 10mg  qhs.  These medications have helped significantly with her anxiety but she is feeling daytime fatigue.      No episodes of sinusitis or bronchitis.  She reports some folliculitis issues in her groin and neckline.  No hospitalizations related to infection in past year.    Physical Exam:  Well-appearing female in no acute distress  Lungs clear without wheezing  Skin: no rashes, no hives  Neuro: no focal deficts    A & P:  1. H/o Mast cell activation syndrome - patient still has not previous brought records in for our review; although report from prior visits states that she has not had e/o elevated mast cell mediators in past:  - cont Hydroxyzine 25mg  TID prn and doxepin 10mg  qhs, cont meds from outside Allergist provider (Dr. Vickie Epley)  - she is doing well and we discussed that we did not want to taper meds currently given how asymptomatic she has been  ??  2. 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

## 2017-06-02 NOTE — Unmapped (Signed)
Made future 6-7 months new re-call follow up appointment. Patient notified to arrive 15 minutes early. Patient received AVS, lab order with the Office number [phone].Patient Verbally understood.  Future Appointments  Date Time Provider Department Center   06/07/2017 10:30 AM Karlene Lineman, MD [facility]   06/08/2017 11:00 AM Biagio Borg, Ph.D. [facility]   06/09/2017 2:00 PM Quentin Mulling, MD [facility] MID MID

## 2017-06-02 NOTE — Unmapped (Signed)
ALLERGY & IMMUNOLOGY Clinic Note     CC: 6 months follow up.    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     Since last visit, the patient had dental work up done on 04/25/2017 which went very well. She stayed in the hospital over night to be observed. Was given Benadryl IV to help with anxiety and flushing and was discharged home the next morning without any further reactions. She also had an episode of flushing, abdominal symptoms, and anxiety on 05/31/2017 while she was getting mammogram and smelled some strong perfume or chemical odor and reacted. She was taken to the ED where she was observed, given Benadryl IV and was discharged home in stable condition.    For her reported mast cell disorder, she continues to use Doxepin 10 mg at bedtime, Hydroxyzine 25 mg three times a day, Allegra 180 mg daily, Ranitidine 300 mg three times a day, Benadryl 50 mg three times a day, Montelukast 10 mg at bedtime and she reports that she has been doing well overall, other than these 2 episodes as noted above.    Reported mast cell disorder  Previous review of her medical record and tryptase levels do not support this diagnosis. Tryptase 03/2016 was 5.5 and 04/2016 was 6. Her symptoms previously included hives, flushing, itching, diarrhea, and hypotension during episodes, ever since she was a teenager. She also has history of dermatographia. She reports having a lot of anxiety and stress which trigger a lot of her symptoms. She is currently taking Doxepin and Hydroxyzine and feels that her symptoms of hives, flushing and itching are better controlled. Of note, last fall she was evaluated with an EEG and EMU by neurology and was diagnosed with psychogenic non-epileptic seizures. She seems to be receptive of this  diagnosis.     Asthma  She reports no wheezing. She is currently not using any inhalers and she reports that her asthma is well controlled. She states this is the best she has been able to breathe in 20 years. Peak flow today was 340 L/min.    Chronic Migraine  She receives Botox injection every three months for chronic migraine. She has experienced chronic migraines for 40 years. She was seen by pain management for 7 years and tried many preventative therapies. She receives significant improvement with Botox and feels well controlled.       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 She was previously on IVIG before moving to [place] from [place]. Her IVIG was stopped in 2013, after which she started subcutaneous Ig until last year. Her Hizentra regimen was 6 g weekly. The IVIG caused her to have a reaction consisting of rash and fatigue, which she did not experience with Hizentra. Recent immunologic workup showed decreased IgG level and poor response to pneumovax. She has received Pneumovax in the past, but not in the past year. She has had prior urticarial reactions to vaccines, including Pneumovax, and no longer receives them. She had an appropriate response to tetanus vaccine (0.4). She denies recurrent sino-pulmonary, skin or GI infections.      Current Medications:  Current Outpatient Prescriptions   Medication Sig   ??? busPIRone Take 2 tablets (20 mg total) by mouth  3 times a day. Indications: Generalized Anxiety Disorder   ??? diphenhydrAMINE Take 75-150 mg by mouth daily.   ??? doxepin Take 1 capsule (10 mg total) by mouth at bedtime.   ??? fexofenadine Take 1 tablet (180 mg total) by mouth daily.   ??? furosemide Take 1 tablet (20 mg total) by mouth daily.   ??? hydrOXYzine HCl Take 1 tablet (25 mg total) by mouth 3 times a day as needed for Anxiety. Indications: anxiety   ??? levalbuterol Inhale 3 mL (1.25 mg  total) by nebulization every 6 hours as needed for Wheezing.   ??? levothyroxine Take 1 tablet (112 mcg total) by mouth daily.   ??? montelukast TAKE ONE TABLET BY MOUTH AT BEDTIME.   ??? nebulizer and compressor aerosol device Use 1 ampule as directed every 4 hours. Indications: Please dispense 1 nebulizer unit   ??? omeprazole Take 40 mg by mouth.   ??? onabotulinumtoxinA (cosmetic) Inject into the muscle.   ??? predniSONE TAKE ONE TABLET BY MOUTH DAILY.   ??? ranitidine Take 1 tablet (300 mg total) by mouth 3 times a day.   ??? triamcinolone APPLY 2 TIMES DAILY     No current facility-administered medications for this visit.        Review of Systems:  GENERAL: +weight gain  EYES: denies changes in vision, watery or itchy eyes   ENT: denies changes in hearing or dental problems   CARDIOVASCULAR: no palpitations, chest pain  LUNGS: -SOB, -cough   ABDOMEN: -diarrhea   GU: denies dysuria or hematuria  HEME/ONC: denies abnormal bleeding or bruising  SKIN: per HPI  MSK: denies muscle weakness, joint stiffness or joint pain   ENDO: denies heat or cold intolerance  NEURO: denies seizures, headaches or focal neurological deficits  PSYCH: +anxiety      Past Medical History:  Past Medical History:   Diagnosis Date   ??? Anaphylaxis    ??? Anemia    ??? Anxiety    ??? Asthma    ??? Atrial fibrillation (CMS Dx)    ??? Bronchitis    ??? Heart disease    ??? Neurological disease    ??? OSA (obstructive sleep apnea)     states resolved   ??? Osteoporosis    ??? Psychogenic nonepileptic seizure    ??? Seizures (CMS Dx)    ??? Spontaneous pneumothorax     states pneumomediatsium/pneumopericardium, self resolved   ??? Thyroid disease    ??? Ulcer    ??? WPW (Wolff-Parkinson-White syndrome)        Past Surgical History:  Past Surgical History:   Procedure Laterality Date   ??? BONE MARROW BIOPSY      benign   ??? CARDIAC ELECTROPHYSIOLOGY MAPPING AND ABLATION      attempted x 2, states not successful either time   ??? CHOLECYSTECTOMY     ??? DENTAL EXTRACTION Right 04/25/2017    Procedure:  EXTRACTION TOOTH # 31, BRIDGE RESECTIONING;  Surgeon: Gladys Damme, DMD;  Location: [facility] OR;  Service: Oral Surgery;  Laterality: Right;   ??? FACIAL COSMETIC SURGERY     ??? HYSTERECTOMY     ??? LIVER BIOPSY      benign hepatic adednoma per patient   ??? REMOVAL HARDWARE LEG Left 08/17/2016    Procedure: REMOVAL OF HARDWARE LEFT TIBIA;  Surgeon: Andee Lineman, MD;  Location: [facility] OR;  Service: Orthopedics;  Laterality: Left;   ??? TIBIA FRACTURE SURGERY     ??? TONSILLECTOMY      +  adenoids       Social History:  Tobacco use: none  Alcohol use: none    Allergies:  Allergies   Allergen Reactions   ??? Acetaminophen Anaphylaxis     Unknown   ??? Aspirin Anaphylaxis     Unknown   ??? Blue Dye Anaphylaxis     Unknown   ??? Epinephrine Anaphylaxis   ??? Flecainide Shortness Of Breath     Itching, decreased libido, wheezing, flushing, numbness.    ??? Gabapentin Anaphylaxis   ??? Iodinated Contrast- Oral And Iv Dye Anaphylaxis   ??? Isopropyl Alcohol Anaphylaxis   ??? Latex Anaphylaxis and Rash   ??? Lidocaine Anaphylaxis   ??? Nsaids (Non-Steroidal Anti-Inflammatory Drug) Anaphylaxis   ??? Onion Anaphylaxis   ??? 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   ??? Perfume Anaphylaxis   ??? Pregabalin Anaphylaxis   ??? Quetiapine Anaphylaxis     Mental Status Change   ??? Red Dye Anaphylaxis     Unknown   ??? Sulfur Anaphylaxis     Pt states: Any alcohol product   ??? Yellow Dye Anaphylaxis     Unknown   ??? Amitriptyline    ??? Doxepin Other (See Comments)     Unknown   ??? Duloxetine      Other reaction(s): Other (See Comments)  Mental Status Change   ??? Epi E-Z Pen    ??? Hydroxyzine Itching     Unknown   ??? Levofloxacin Itching   ??? Montelukast    ??? Verapamil (Bulk) Hives     Insomnia,  flushing, scattered brain, migraine, itching, anxiety.    ??? Adhesive Tape-Silicones Itching, Rash and Swelling     Unknown   ??? Trazodone Rash       Physical Exam:   Blood pressure 128/82, pulse 86, temperature 97.6 ??F (36.4 ??C), temperature source Oral, height 5' 7 (1.702 m), weight 217 lb 3.2 oz (98.5 kg), SpO2 99 %.  GENERAL APPEARANCE: Well developed, well nourished, in no acute distress, alert and oriented.  SKIN: Inspection of the skin reveals no rashes, ulcerations or petechia. Healing scar on L lower leg from titanium removal.  HEENT: Normal turbinates, no evidence of swelling or rhinorrhea. No masses, polyps or pus. No septal perforation.   NECK: Supple and symmetric.  LUNGS: Auscultation of the lungs revealed normal breath sounds, no wheezes or crackles.  CARDIOVASCULAR: Regular rate and rhythm without any murmurs, rubs or gallops.  ABDOMEN: Soft and nontender with normal bowel sounds.  LYMPH NODES:  No lymphadenopathy.  EXTREMITIES:  No cyanosis, clubbing or edema.  NEUROLOGIC: No focal neurological deficits noted.    Assessment and Plan: Ileana Chalupa is a 57 y.o. female with a complicated PMHx significant for asthma and idiopathic mast cell activation who presents to the allergy clinic today for a f/u visit.     1. Reported history of mast cell activation  - Currently controlled and doing well  - Continue Doxepin 10 mg at bedtime  - Continue Hydroxyzine 25 mg three times a day  - Continue Allegra 180 mg daily  - Continue Ranitidine 300 mg three times a day  - Continue Benadryl 50 mg three times a day  - Continue Montelukast 10 mg at bedtime  2. Asthma  - Currently controlled, not using any medications or inhalers    3. 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 Chronic fatigue  - This could be likely  in the setting of her anti-histamine use  - Encouraged to monitor diet and increase physical activity    Follow up: 6 months     Matilde Sprang, D.O.  Allergy and Immunology Fellow    Discussed with the allergy attending Dr. Harley Hallmark who is in agreement with the above plan.

## 2017-06-07 ENCOUNTER — Ambulatory Visit: Admit: 2017-06-07 | Discharge: 2017-06-07 | Payer: PRIVATE HEALTH INSURANCE | Attending: Neurology

## 2017-06-07 DIAGNOSIS — G43719 Chronic migraine without aura, intractable, without status migrainosus: Secondary | ICD-10-CM

## 2017-06-07 MED ORDER — botulinum toxin A (BOTOX) injection 200 Units
100 | Freq: Once | INTRAMUSCULAR | Status: AC
Start: 2017-06-07 — End: 2017-06-07
  Administered 2017-06-07: 15:00:00 200 [IU] via INTRAMUSCULAR

## 2017-06-07 NOTE — Procedures (Addendum)
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  Total days headache free per month: Marked improvement in headache.  She wanted 400 units injected which I cannot do.  Severity of headaches: moderate  Botox effective: Yes    The risks, benefits and anticipated outcomes of the procedure, the risks and benefits of the alternatives to the procedure, and the roles and tasks of the personnel to be involved, were discussed with the patient. The patient has given written informed consent to the procedure and agrees to proceed.Yes    Time out done with patient and RN/MA in the room. Patient identified by name and DOB.    Treatment # 2   Dr. Leonard Downing has done many injections before up to 400 UNITS especially in the back.  I am only doing the BOTOX protocol for chronic migraine intractable.  BOTOX brought in by patient? No  Lot #: Exp:    Dilution: 5 units/0.1 ml ( 100 unit vial with 2 cc diluent or 200 unit vial with 4 cc diluent)  Diluent: normal saline  Indication: Chronic Migraine Intractable    Injection Sites   Muscle                         Fixed Site/Fixed Dose                        L     R Optional follow pain # units     Corrugator                   10 Units divided in 2 sites                         Procerus                      5 Units in 1 site                                         Frontalis                      20 Units divided in 4 sites                          Temporalis                  40 Units divided in 8 sites                         Occipitalis                    30 Units divided in 6 sites                         Cervical PSPs             20 Units divided in 4 sites                         Trapezius  30 Units divided in 6 sites                   22.5 units extra left trapezius in 5 divided doses                                                                                                   22.5 units extra right trapezius in 5 divided doses                                                                                                   Low injections down mid spine where muscle is tight.       Dry needling done across bilateral cervical paraspinal muscles where she said she had injections of 12 units each.   She said Dr. Leonard Downing gave her too much and she could not raise her head.  I told her I would not do that.    I told her I would only do the BOTOX protocol approved [place] for chronic migraine headache        Other sites:  SCM                                                                                             Rhomboids                                                                                    Other  _________     Subtotals                                                                                 155 Units plus 45 units EXTRA as above     Total Units used:200   Total Units wasted:0   Patient did tolerate procedure.     Change in Treatment Plan?no    RTC: 3 Months

## 2017-06-07 NOTE — Progress Notes (Addendum)
10:30 am  Dr. Elmyra Ricks started the Botox injections to the facial and head area.     Patient tolerated the injections well.         Patient sign consent form and time out completed.   Patient set up for the physician for the Botox injections.       Botox reconstituted per [facility] instructions.      Botox 100 unit vial mixed with 2 ml of Saline (0.9% sodium chloride). Dilution inserted into the Botox vial. The saline being pulled into the vial.  Listen for the vacuum to be present upon disconnecting the syringe from the needle in the vial. Vial mixed side to side or rotating the vial.  Vial not inverted when drawing up the botox. Botox drawn up in a 1 ml syringe and 30 gauge attach once drawn up.      Patient scheduled for follow up Botox injections with Vance Gather on August 19 th at 10 am.

## 2017-06-07 NOTE — Unmapped (Signed)
The new neurology clinic located at   [address]  [facility] [phone].   New number [phone]

## 2017-06-08 ENCOUNTER — Ambulatory Visit: Admit: 2017-06-08 | Discharge: 2017-06-08 | Payer: PRIVATE HEALTH INSURANCE

## 2017-06-08 DIAGNOSIS — F439 Reaction to severe stress, unspecified: Secondary | ICD-10-CM
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
## 2017-06-08 NOTE — Unmapped (Addendum)
[facility]                NEW PATIENT VISIT NOTATIONS                  For Emily Bonilla                               Clinician: Phyllis Ginger, MD    Chart notes ([facility], and CareEverywhere), Problem List, Labs, Allergies and Medications are reviewed in preparation for current visit.    PCP: Court Joy, MD  Referred by: DR Arnetha Massy, PhD    -----------  HPI:  Emily Bonilla is a 56 y.o. who is here for their first consultation with [facility].    Patient's initial reasons for seeking consultation:      1. Mast cell activation syndrome (CMS Dx)  PTH    C-reactive protein    Sed Rate    Magnesium   2. Multiple allergies     3. H/O severe allergy with RECURRENT ANAPHYLAXIS      many extreme reactions including angioedema despite high daily antihistamine doses orally   4. Anxiety and depression     5. Chronic diarrhea     6. Caries     7. WPW syndrome     8. CVID (common variable immunodeficiency) (CMS Dx)  PTH   9. 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 Chronic fatigue  T3, Total    T4, free    TSH (Thyroid Stimulating Hormone)    Thyroid peroxidase antibody    Vitamin B12    Homocysteine, serum    Gamma GT    Iron Studies (Iron + TIBC)    Ferritin    Methylmalonic acid, serum   11. Vocal cord dysfunction     12. Frequent headaches  Magnesium    GETTING Q3 MO BOTOX   13. Impaired glucose regulation     14. Hypothyroidism due to Hashimoto's thyroiditis     15. Excessive weight gain  T3, Total    T4, free    TSH (Thyroid Stimulating Hormone)    Thyroid peroxidase antibody    Hemoglobin A1c    Gamma GT    T3, reverse   16. Vitamin D deficiency  Vitamin D 25 hydroxy   17. B12 deficiency     18. Hyperparathyroidism (CMS Dx)       Problem   Hypothyroidism Due to Hashimoto's Thyroiditis     Email from pt May '19:    A little  history,   After I was Airvac out of [place] in 1996 and my continual air vac to [employer] and Immunology)through 2002 until [employer] [place] came on board fulltime. Dr Rogelio Seen( Allergist), Dr F Awan(hematology), Dr Leonard Downing( neurologist). ??Although, I would go to [facility] as needed for tweeks in my care.   IV fluids were very important to keep my body stable. ??It's amazing how good I feel after getting fluids. ??When my body stresses my Mast cells flair, I flush more and have increased diarrhea which is not helpful for fluid retention, increased inflammation, brain fog, extreme fatigue, BP drops with passing out( syncope) and until the last 2 years my lungs were very bad. ( I'm appreciative that the last two years my lungs  have been great!!), itching, VCD, MTD issues, and two more Life flight transports needed in 2009, 2011 both while on vacation out of state. I had severe reactions that could not be controlled by local ER so I was air vac to hospitals that knew how to treat Mast Cell Activation. ?? In 2007 Dr Carmon Ginsberg Awan(he is at [facility] now)Hematology was brought onto my care. ??   He evaluated me from top to bottom. ??Found that I would do better on IVIG, IV fluids, IV iron and magnesium. ?? B 12 levels fluctuated but never as low as they are now. ??I Always had issues with folic acid levels. But are ok now.   Back to Dr Nelle Don after my veins developed so much scar tissue and Emergency Response teams could not get IV in during emergencies and the IVIG replacement needed my first port was put in 2008. ??I had that port until 2013. A new port was put in on opposite side in 2014 due to sluggish Infusions. [facility] [place] [facility] put me on Daily IV fluids1000 cc of LR, IV push benadryl q4-6 hours via my port. ??Steroids as needed. ??I was in a awful flair for several years. ??As hard as it was to keep up with the regiment ??I felt so much better and was able to enjoy  life more, see friend and travel.. The summer of 2015 was when I kept having increased seizures and syncope in grocery stores produce dept( [employer] and bi lo) the seizures wer increasing big time. ??So the Allergist put me back on doxipen and steroids with my IV benadryl and IV fluids I got better. June 2017 broke legs after syncope from smelling perfum. Allergic reactions to O2 mask and antibiotics and   pain meds triggered symptoms and my face, lower lip, eyes swelled and then became infected. ??I was hospitalized 3 months for face surgery, left tibia fib multipal break and mast cell reaction. CVID was kicking in as my IgG levels were 319. My port was taken out and a pic line put in just incase my port compromised. ?? My port came back clean from infection. ??After I was discharged from hospital to rehab nursing home to slowly ambulate as I had not been at bearing for 4 months. ??I stayed 3 weeks as they had cockroaches and could not take care of my dietary needs. ??I left and rehabbed myself. ??Since then I have had flairs related to allergy to hardware to left tibia fib and the transition off my IV fluids and trying to find a hospital that could treat me/not afraid of my conditions. ??That took 4 years!!!!!!!! ??     During all the medical issues I was going through a divorce Oct 2012.   That was rough transission as my drs of 14 years retired and/or left [employer] [place] after affordable health care act started to be implimented.   Lots of changes and I went to get psychotherapy weekly to bi weekly for the next 4 years. ??This was very important to my over all health. My ex and I went to marriage counciling 4 years weekly to deal with the stressors of my illness on our marriage before the divorce.     -------------------------------------  ROS/ SYMPTOM TRACKER (IFM MATRIX REVISED)   X= Moderate risk domains; XX= Higher risk domains  O= Lower risk domains  CC (pt's own goals):  Identifying possible triggers of  inflammation   Inflammation,  Pain   Anaphylaxis       Patient care team:  Patient Care Team:  Court Joy, MD as PCP - General (Internal Medicine)  Christena Flake, DO as Consulting Physician (Allergy and Immunology)      First visit: 06/09/2017       LABS    X  Several abnl metabolic markers       There is no immunization history on file for this patient.    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, [employer], [place]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    RAISED BY  MOM/ STEP DAD IN [place]    HX OF WORK AS ICU NURSE    COGNITIVE IMPAIRMENTS WITH ANAPHYLAXIS    G1P1 (DAU '85)  GDAU '17 SICK BABY    WT UP 70# IN PAST 8 MONTHS (CAN ONLY ACCOUNT FOR 50# FROM AUG'18 IN RECORD)     HAS EMERGENCY GMS MONITOR, PERSONAL ON [employer]  ALSO HAS AIDE TO ALL APPTS    HAS MOVED FOR HEALTH CARE MANY TIMES OVER PAST 7 YRS     ASSIMILATION/ Digestion:   X  GI / Digestion & Absorption issues       Issues considered include history/current use of NSAID, PPI, H2Bs  increasing intestinal permeability;  Surgical interventions such as Cholecystectomy, Appendectomy, partial or more extensive regional intestinal resections,compromising digestive resilience,  Diagnoses of GERD/HH/ IBS vs IBS    IgG food testing:  No results found for: AIGGCASEIN, AIGGWHEAT, AIGGOAT, AIGGMAIZCORN, AIGGPEANUT, AIGGSOYBEAN, AIGGEGGWHOLE    FOOD SENS TESTING IN '98 ([facility])  FINDING  PRESERVATIVE/REACTIVITIES    GIMAP/PCR stool testing:     BM 15X THIS AM,  2 KINDS OF DIARRHEA SINCE CHOL'Y    Ulcers;Colitis;Gallbladder Disease;Diarrhea;Gastroenteritis (nausea and vomiting)    HX CHOL'Y '15  ?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    ABD BLOATING  WT GAIN, 5#s UP AND DOWN,    Mag:   Magnesium   Date Value Ref Range Status   06/09/2017 2.0 1.5 - 2.5 mg/dL Final     No results found for: MAGNESIUMRB  OPTIMAL: Serum mag >2.1; RBC mag >6     DIET/ >>>>>>>>>>>>>>>>>>>  NUTRITIONAL BACKGROUND OFF RICE, SOY  LIMITS GLUTEN,   MAKES OWN BREAD  EGGS SEEM OK       DEFENSE/ REPAIR/ Immune-Inflamm:  XX  Risks for infections or inflam/   autoimmune issues    Issues considered include chr/recurrent NSAID use, recurrent illnesses and/or repetitive antibiotic use, needs for chronic or intermittent topical/oral/ inhaled/ injectable streoids; personal and family history of Inflammatory and/or Autoimmune disorders,    HX:  5+ ABX PAST 5 YRS   10+ IN LIFE   Pneumonia, sinus infection, skin infections  BITTEN BADLY BY RED ANTS AGE 23,  5+ STEROID COURSES   Mast Cell Activation Syndrome,     MAST CELL DEGRANULATION ISSUES  (WALTER REED) DX '96 (DR HARBUT)  FAM HX MAST CELL ISSUES (MAT AUNT D.58, MAT UNCLE, MAT GF D. 49)  VOCAL CORD DYSKINESIA,  S/P HELIOX TX,     ANAPHYLAXIS > LIP ANGIOEDEMA > SURGERY TO REPAIR    MONTHLY IVIG, SINCE '08 -'14  BEST IgG 700s  '14-'17 PATCHWORK DOCS WITH HYZINTRA  '18-PRESENT HAS A TEAM AT [facility]  OFF IVIG PAST 2 YRS AND AND WT HAS GONE UP FROM 89# 2 YRS AGO    BOTOX Q3 MO FOR PAIN MGMT      Lab Results   Component Value Date    WBC 12.6 (H) 05/31/2017   WBC:  %N: 59.7  %L: 27.7  %M: 11.1  %E: 1.1  %B: 0.4    No results found for: HSCRP,   Lab Results   Component Value Date/Time    CRP 17.8 (H) 06/09/2017 1549     OPTIMAL: CRP<1  Lab Results   Component Value Date/Time    ESR 26 06/09/2017 1549    ESR 29 05/31/2017 1118     No results found for: SEDRATE  No results found for: ANA  No  results found for: RF     ENERGY/ mitochondria:   X  Cellular Energy compromises    Issues considered include conditions potentially causative of mitochondrial dysfunction,  compromise of Krebs/ electron transport, ATP production and oxidative stress  Factors including chronic /recurrent cephalgia, central and/or peripheral neurologic deficits, balance issues; nutrient deficiences such as cofactors for ETC: B2, B6, B12, folate, CoQ10, iron or diets deficient in antioxidants       SEIZURES   LOC    SZ IN GROCERY STORE/S > HOME DELIVERY    Lab Results   Component Value Date    HGB 14.8 05/31/2017    HCT 45.2 (H) 05/31/2017    MCV 84.9 05/31/2017    PLT 317 05/31/2017       Lab Results   Component Value Date    VITAMINB12 163 (L) 06/09/2017     Lab Results   Component Value Date    FERRITIN 46.0 06/09/2017     Homocysteine   Lab Results   Component Value Date    HOMOCYSTEINE 8.8 06/09/2017   ,(Hcy OPTIMAL <7):     MMA :  (OPTIMAL <150)  CoQ10 :  (OPTIMAL >0.86)     COMMUNICATION/ hormones:  XX  Hormonal concerns  Evidence of cellular communication compromise,    Issues considered including HPA Axis imbalance, autonomic dysfunction, glycemic dysregulation, centripetal adiposity, thyroid and/or gonadal dysfunction     BROTHER BRITTLE DIABETIC (DM2)  MOTHER THYROID CANCER, SEV OTHER RELATIVES,    HX DUB, > HYST '09  HX SHOCK/ COLLAPSE DAY 1-2 OF PERIODS    THYROID INFLAMES  ON RX T4 110  Lab Results   Component Value Date    TSH 6.11 (H) 06/09/2017    T3TOTAL 98.0 06/09/2017    FREET4 0.81 06/09/2017    THYROIDAB 747.0 (H) 06/09/2017     Lab Results   Component Value Date/Time    T3REV 14.8 06/09/2017 1549     TT3/ RT3: __  (TT3/ RT3 OPTIMAL 6-10)  Lab Results   Component Value Date    HGBA1C 7.3 (H) 06/09/2017   (OPTIMAL <5.3)     ELIMINATION/ detox:   XX  Phase I/ II Detox pathway, GI or renal elimination  risks    Issues considered include poor phytonutrient provision and hydration, gastrointestinal dysfunction, slow  transit compromising first line detox as well as hepatic overload from environmental exposures, tobacco 1st-3rd hand, EtOH, hobbies, dental amalgams, and genomic uniqueness such as SNPs for COMT/SOD, etc      MULTIPLE CHEM SENSITIVITY  USES ION PERSONAL CARE PRODUCTS ,    Lab Results   Component Value Date    ALT 14 10/28/2016    AST 16 10/28/2016    GGT 53 06/09/2017    ALKPHOS 110 10/28/2016    BILITOT 0.3 10/28/2016     GGT (OPTIMAL < 30)  URIC ACID No results found for: URICACID  Optimal < 6   STRUCTURAL INTEGRITY:  X  Structural issues     Issues considered include birth traumas, spinal segment compromises, MVA, fractures, repetitive motion injuries and persistent ergonomic stressors     DENTAL SURGERIES    R TIB FRX > TITANIUM, REACTIVE. AND TOOK OUT TITANIUM 10 MO AGO    HX PORTS  PLACED POST OP LIP ANAPHYLAXIS    Lab Results   Component Value Date    VITD25H 10.5 (L) 06/09/2017     (OPTIMAL 50-60)  PTH:   Lab Results   Component Value Date    PTH 151.0 (H) 06/09/2017    CALCIUM 9.4 05/31/2017   Optimal PTH <30     TRANSPORT / CV:  X  Circulatory concerns    Issues weighed include evidence of vascular compromise, inflammatory contributors to endothelial dysfunction; hypertension, coagulation defects, anticoagulation, medications causing dysregulation of vascular stability     HX:   WPW  WT GAIN    RX:   Vitals:    06/09/17 1348   BP: 132/68   Pulse: 87   SpO2: 97%     Wt Readings from Last 3 Encounters:   06/09/17 218 lb 9.6 oz (99.2 kg)   06/07/17 217 lb (98.4 kg)   06/02/17 217 lb 3.2 oz (98.5 kg)   WT 165  8/'18     8  of 8 Risk Factor Domains                        THERAPEUTIC LIFESTYLE CHANGE (TLC) TRACKER      Initial visit: 06/09/2017        CHANGE READINESS:  Precontemplation, contemplation, preparation, action, and maintenance PREP   MOVEMENT  Assessing for compromises including sedentary lifestyle or work, impaired mobility, or energy  MOVEMENT:      1         2       3         4        5        X      low risk     >>>>>>  higher risk       SLEEP  Assessing compromises including difficulty with sleep onset, maintenance or quality, or quantity <7 hours routinely, shift work, dark/light reversal    EPWORTH:  No flowsheet data found.   SLEEP:     1         2       3         4        5       X     low risk     >>>>>>  higher risk    NEWER PUFFING WITH SLEEP SINCE WT GAIN  SUMMER '  18     RELATIONSHIPS & SUPPORT NETWORK:  Assessing support for emotional health as well as initiating and maintaining TLCs   RELATING:      1         2       3         4        5       X     low risk     >>>>>>  higher risk    CHURCH  DAU  BABY GRANDDAU     EMOTION INVENTORY:  Assessing concerns for historical or ongoing physical or emotional safety, poor self efficacy or esteem, labile mood or frequent anxiety, dysthymia   EMOTIONS:        1         2       3         4        5       X     low risk     >>>>>>   higher risk   TOX/DETOX  Assessing concerns for former or current tobacco or excessive  EtOH, or substance exposures; occupational or hobby related xenobiotic or VOC*/ heavy metal exposures:  Chemotherapeutic agents; Rx polypharmacy      *VOC: volatile organic compounds/ or Hazardous materials  Resources cited: [email or web address] DETOX:     1         2       3         4        5            X   low risk     >>>>>> higher risk   RESTORATIVE PRACTICES:  Assessing concerns for sufficiency of self care practices, creation of quiet, implementation of mindfulness, restorative breathing practices, touch, disconnection from [employer], etc     MBSR      1         2       3         4        5        X     low risk     >>>>>    higher risk    STARTED WITH MEDITATION IN '98,   AWAKENS QUIET,  AM MEDITATION NOW  HAS APPS ON PHONE, INSIGHT TIMER  Tend flowers, meditation, watch [employer] station, play memory games   [place], grandchildren, flowers, nature, travel, family.   >>MBSR TOOLS, Rx   DENTAL  Assessing  concern for hygienic, surgical, or ongoing ORAL  Inflammatory/ Infectious processes     DENTAL:      1         2       3         4        5           X    low risk     >>>>>    higher risk    Grinding;TMJ;Gum Disease;Root Canal/s   All done from 2004-2012.  April 2019 oral surgery to remove a decayed tool part of a 3pc bridge. Went to OR and intubated and over night in hospital.      HYDRATION:   Assessing sufficiency of water intake, through free water and/or food fluid    >>Advised on hydrating to point of light yellow urine H2O:     1  2       3         4        5       X     low risk     >>>>>    higher risk  Currently at  - - oz water daily    10 X 16 OZ WATERS DAILY,  SO DRY,  MISSING IVF  PER PORT    DESIRES:  IVF  FLUIDS LR 500 CC/NIGHT >  X 30D THEN 1000 CC Q 3D     DIET/ NUTRIENTS:  Assessing phytonutrient intake levels, individual's of awareness and implementation of food as medicine, their use of whole foods, phytonutrient diversity, avoidance of high refined sugar/ processed foods     DIET:       1         2       3         4        5        X     low risk     >>>>>    higher risk    OFF RICE, SOY  LIMITS GLUTEN,   MAKES OWN BREAD  EGGS SEEM OK   SUPPLEMENTS:                                    **FS    HIGHLY PRESERVATIVE SENSITIVE!!     RX   T4 112  DOXEPIN 10MG  26MG  (  ZYRTEC UP TO 6/D  PPI OR PEPCID  BENEDRYL 150 MG/D  SINGULAIRE  XOPENEX  ALLEGRA  BUSPAR    NO IVF   USED TO GET 500>1200 ML    SUPP:  OFF  TRIED MANY PROBIOTICS         KEY:  >>: POC;  Sx/RISKS 1  LOW < <--5  HIGH   C: CONTINUE  S: STABLE  R!: RESOLVED!  P: PERSISTENT  E: EPISODIC  W: WORSE  B: BETTER/IMPROVED  MBSR: MINDFULNESS BASED STRESS REDUCTION   AV:[id number] ENZYMES  VOC: VOLATILE ORGANIC POLLUTANTS  ---------------------  INTEGRATIVE MED FLOW SHEET  New Patient Md Consult Questionnaire 05/12/2017   Which level of study have you completed? Two-year college   Other Source: Meditation intergrative med dr refered me   Overall health? Fair   Goals? Identifying possible  triggers of inflamation   1st (of 3) Top Health Concern: Inflammation,   2nd Top Health Concern: Pain   3rd Top Health Concern: Anaphylaxis   Maximum Height: 5'7   Maximum Weight: 212   Minimum Adult Weight: 89   Weight Recently Up/Down: Up   Courses of antibiotics in the past 10 yrs: 5+   Courses of antibiotics lifetime: 10+   Type of infections: Pneumonia, sinus infection, skin infections   Steroid/Prednisone courses (oral/injection) past 10 yrs: 5+   Type of conditions: Mast Cell Activation Syndrome,   # of current prescriptions? 5 or more   Multivitamins? No   Individual Vitamins/Minerals? No   Digestive Aides? No   Energy Supplements? No   Liver Support Supplements? No   Anti-Inflammation/Pain? No   Mood/Sleep Supplements? No   Allergy/Cold/Sinus? Yes   Antihistamines (Claritin,Zyrtec) Yes   Brand: Certirizine, Gluten free Members Marks( SAMS BRAND)   Dose - Frequency 1 @night .  I can increase up 6 a day during severe flairs   Used Since 15+ years  Nasal Sprays No   Mucinex (Guaifenisin) No   Herbal - Elderberry No   Herbal - Echinacea No   Allergy/Cold/Sinus Other Yes   Brand: Benadryl, Members Marks (sams)   Dose - Frequency 150 mg a day. Dose changes fro 25 mg to 300mg  daily   Used Since 25 years +   Herbs/Tinctures/Essential Oils No   Physical therapy? Yes   Helpful? Yes   Currently use? No   How often? When I broke legs therapy. I rehab myself after Rehab center was inadequate    Talk therapy/counseling? Yes   Helpful? Yes   Currently use? Yes   How often? Dr Verna Czech, Meditation works best for me.   Who/where? Dr Dan Humphreys, [facility].  Meditation works best for me.   Massage therapy? Yes   Helpful? Yes   Currently use? No   How often? Insurance does not [employer].   Who/where? [facility]   Yoga? Yes   Helpful? Yes   Currently use? Yes   How often? Stretching after dinner   Who/where? At home alone.    Chiropractic therapy? No   Helpful? No   Currently use? No   How  often? N/A   Acupuncture? Yes   Helpful? Yes   Currently use? No   How often? Need insurance to cover.   Meditation? Yes   Helpful? Yes   Currently use? Yes   How often? Nightly, and during high stress levels   Who/where? Dr Clarene Critchley Deborah Chalk   Tai Chi/Qigong? Yes   Helpful? Yes   Currently use? No   How often? [facility] [employer] started in 1998   Who/where? None    Other Integrative Healthcare Providers: I was getting massage therapy but I pay out of pocket.  Not in budget.    Regular dental exams? No   Last visit: March 2019   Dental work: Grinding;TMJ;Gum Disease;Root Canal/s   When? All done from 2004-2012.  April 2019 oral surgery to remove a decayed tool part of a 3pc bridge. Went to OR and intubated and over night in hospital.   Cavities/fillings? Yes   How many? 10   Type Gold or Silver/Mercury Fillings;Porcelain Fillings   Brushing frequency: 2 times a day   Flossing frequency: 3 times a week.  Friction was triggering anaphylaxis    Floss with? Floss;Waterpik   Outside of the [place] Yes   Wilderness camping? Yes   Where? [facility]    Sexual health/function concerns: No   AVG hrs of sleep per night? 6-8   Avg # of times awaken per night? 1-2   Response to waking up? Depending on my pain level.  I do get deep sleep.  But when pain is bad I have sleep studies that show alpha wave intrusion and hundreds of wake up.  My snoring has stopped on antihistamine    Read before falling asleep? No   Watch TV before falling asleep? Yes   TV in the bedroom? No   Work on computer/laptop/smartphone before falling asleep or during the night? No   Taken medications in past for insomnia? Yes   Type: Ambien,   had adverse reactions to it.    Currently take sleep aides? No   Awake feeling rested? Yes   # of times hit the 'snooze' button: 1-2   First 30 minutes upon awakening: Meditate/Pray;Review to do list for the day   Exercise frequency: 1x/week   Track/monitor  activity? Yes   Tracking device:  Other   Other: [employer] and sprint daily walk monitor, pulse bp    Leisure activities: Disabled, i try to exercise with out stressing my body out which triggers mast cell degranulation.   Able to walk 3 blocks/swim/use exercise bike? No   Walking: 1x/week   Minutes/Day 15   Stretching (yoga, pilates, bands, etc.) 1x/week   Minutes/Day 3   Strength Training/Weight Lifting I don't do this   Group Fitness (zumba, kickbox, crossfit, etc.) I don't do this   Aerobic (biking, running, swimming, etc.) I don't do this   Physical limitations preventing exercise: Ankle pain;Back pain;Fatigue;Shortness of breath;Other limitations   Other limitations to exercising: Anaphylaxis,  abdominal distension, and stressor to my body triggers me.     Severe problems: Ulcers;Colitis;Gallbladder Disease;Diarrhea;Gastroenteritis (nausea and vomiting)   Bowel Frequency: Several daily   Intestinal Surgery: Gall bladder   Intestinal Scopes? Both   Date: Every 3-5 years   Change in eating habits due to health? Yes   How? No preservatives, no dyes, no chemicals, food allergies, digestive problems    Dietary Lifestyle: Gluten Free   Special Diet/Nutritional Prog? Yes   Special Diet Type: Chemical free, as gluten free as possible, dye free, preservative free.   Track/monitor calories/food intake? Yes   System (MyFitnessPal, Spark People, etc.) My drs orders and allergic diet.   How often? Daily   # Meals per day: 3   Snack between meals? Yes   Can afford the food you need? No   Adequate facilities for food prep? Yes   Cultrual food preferences No   Any food cravings? No   Foods you avoid? Yes   List: Allergic diet, multipal chemical syndrome    Make own food choices/control food environment? Yes   Food Problems: Dairy;Soy;Wheat;Other   Other: Chemicals, preservatives, dyes.   EPI Pen for severe allergic reactions Yes   Diagnosed with eating disorder No   Concerns about relationship with food No   Do you ever: Eat in front of the TV;Feel staisfied  after eating   Artificial Sweetners? No   Cook at home freq: Daily   Eat with others freq: Never   Eat at restaurants freq: Never   Eat fast food freq: Never   Servings per day: sweets/desserts/candy 1-2 per day   Servings per day: fried foods/fast food/chips/pizza None   Servings per day: dairy products/cheese/yogurt etc. 1-2 per day   Brand/Variety Chedar or mozzarella cheeze low aged   Servings per day: non-dairy milk,creamer,yogurt,cheese,etc. None   Servings per day: carbs/breads/cereal/pasta/rice 1-2 per day   Servings per day: fruits 3-6 per day   Servings per day: vegetables 3-6 per day   Servings per day: dark leafy vegetables 1-2 per day   Servings per day: proteins 3-6 per day   Types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 work, don't wait for a reminder from [place], especially for nonUC tests that you will not see in MyChart.  For each follow up appointment, we will be asking you to complete one or two electronic questionnaires before your visit so please check your UCMyChart in the days prior to your next visit and complete these important symptom questionnaire/s (MSQ). These help [place] partner with you for your health more efficiently  Please bring your Integrative Medicine folder, all prior handouts and test reports as well as updated lists of medications and supplements (bottles or pictures of labels) to each visit, since electronic access to your data is not as easy as it should be!        [x]  MIND BODY:  MAKE TIME FOR QUIET BREATHING, DISCONNECT  FROM PHONE/TEXT/COMPUTER, SPEND TIME IN NATURE, GRATITUDE JOURNAL'  >>MINDFULNESS TRAINING COURSE,  CHECK WEBSITE FOR LATEST CLASS SCHEDULE    >>         [x]  GUT HEALTH:  EAT REAL FOOD, NOT TOO MUCH, MOSTLY PLANTS!  >>      [x]  MMUNE/ ANTI- INFLAMMATION SUPPORT:  TO HELP YOUR BODY DEFEND ITSELF, LIMIT SUGAR TO HELP STRESS SIGNALS, SLEEP TO HELP BRAIN REPAIR,  >>      [x]  HORMONE SUPPORT (ADRENAL, THYROID, INSULIN, GENDER):  SUGAR CAN CAUSE COMMUNICATION IMBALANCES, VITAMIN D IS A HORMONE WE MAKE FROM THE SUN!  >>      [x]  ENERGY REGULATION:  NUTRIENTS SUCH AS VITAMIN B2, B12, IRON AND OXYGEN ARE ESSENTIAL FOR MITOCHONDRIA (YOUR BATTERIES)  >>      [x]  DETOX SUPPORT:  HEALTHY GUT TRANSIT IS THE BEST DETOX! IF THE GUT ISN'T ABLE TO DO IT'S JOB, THE LIVER WILL HAVE TO TAKE UP THE SLACK!  >>    [x]  TRANSPORT/ STRUCTURE:  GETTING NUTRIENTS THROUGHOUT THE BODY AND HOW WE MOVE THROUGH SPACE ALL CONTRIBUTE IMMENSELY TO OVERALL HEALTH. ...CONSIDER YOUR SPINE AND JOINTS AS STARS IN A CONSTELLATION. MAKE AS MANY CONSTELLATIONS THROUGH YOUR DAY AS POSSIBLE NOT JUST A STATIC FEW POSITIONS!  >>            HANDOUTS:    Why a Functional Medicine Approach?    When environmental inputs (water, air, nutrients, exercise, stress, xenobiotics, microorganisms, radiation) are processed through a person???s unique metabolism, influenced by our genetic tendencies (the cards we're dealt), and directed by how we play our cards (social environment, spiritual, emotional and mental beliefs, attitudes and feelings), then transformation occurs.      Functioning optimally, food becomes energy and information. Structural wholeness is maintained from a cellular level to the whole body, wastes are eliminated, defense against invaders is mounted, nutritional data packets are transported throughout the body and Health is achieved.    But, when the system breaks down anywhere, instead of health as the outcome of biological transformation, we experience core clinical imbalances that may cross many organ systems and may be detectable long before obvious  disease is diagnosable. Prevention and rebalancing at this stage is the goal.     However, when the system is already way out of balance signaled by symptoms such as indigestion, heartburn, excess gas, too frequent or infrequent bowel movements, fatigue, pain, depression, sleep disturbance, etc, more attention to the triggering factors or mediating deficiencies must be addressed in order to restore balance.    Nutrition is one of the key environmental inputs that can be influenced to support optimal health and function.  The gastrointestinal system that carries out our functions of digestion and absorption of food is connected to our nervous system (think of nervous ???butterflies??? sensations) and both are linked through cells of the immune (defense and repair) system. As such, the gut is the alarm system and starting point for many chronic symptoms far from the intestinal tract.     A glitch in the digestive, absorption or elimination steps can lead to sickness and because of the interconnections with the body???s nerves and immune mechanisms, all sorts of triggers can set in motion, dis-ease.    For this reason, no matter where health problems seem to originate, wisdom and experience dictate starting with the gut. Especially if the gut is crying out with symptoms of being ill at ease, aiding digestion and absorption is the place to begin the healing  journey. It is critical to address emotional and physiological stress which regulate hormones (such as cortisol and insulin) and influence gut function. It is necessary, as well, to identify ways in which those same hormones are influenced by gut and nutritional balance or imbalances, an intricate symphony easily out of tune when both factors are not addressed simultaneously.   For this reason, the ???5 R??? factors to restoration are pivotal.  (continued on [employer] handout).    Gery Pray said: ???The doctor of the future will give no medicine but will  interest his (or her) patients in the care of the human frame, in diet and in the cause and prevention of disease.                                                                GUT RESTORATION                                                   GI 5-R PROGRAM  An unhealthy intestinal tract affects many other organs and systems in the body, including energy levels, mood, inflammation, weight and immune defenses. With a healthy gut, you can better absorb the nutrients you need and keep out harmful substances.    1) REMOVE:    a) Reduce any food triggers, most common may be DAIRY and/or WHEAT products; a 21-30 day elimination is the best way to know for sure because even negative or normal blood tests do not substitute for how you feel OFF a food,   b) Get rid of harmful bacteria, parasites or toxins that may be in the gut which may include certain Rx or herbs (this may require special stool tests)    2) REPLACE: Many stomach problems are actually from too little acid rather than too much. GERD is not necessarily too much acid but acid/stomach contents where they don't belong, in the esophagus. Many prescription medications reduce our first line of defense and optimal digestion. Refined and processed foods don't have the natural enzymes for digestion that whole foods do. Whether or not you are aware of digestion issues, you may need a combination of enzymes to better digest proteins and fats such as PAPAIN (papaya) and/or BROMELAIN (pineapple) as well as BETAINE HCL along with each meal. If the gall bladder is missing or poorly functioning, BILE may need to be taken with some meals.     3) REINOCCULATE: There is growing data on the critical, beneficial relationship we have with our own gut bacteria, 4 - 8 pounds worth. They help [place] digest food, protect and repair the gut, make certain hormones and nutrients for [place]. We harm them by our lifestyles, dietary indiscretions and many medications. Help your symbiotic  resident beneficial bacteria thrive by taking in probiotic foods or supplements that contain the so-called ???good??? bacteria such as BIFIDOBACTERIA and LACTOBACILLUS and some good yeast species such as SACCHAROMYCES, and by consuming the high soluble fiber foods that good bugs need to survive, called ???prebiotics.???  Look for a heat stable product that contains 10 to 200 billion CFU (colony forming units) and use a variety  of products over time.  Diversity is important.    4) REPAIR: Help the lining of the GI tract repair itself by supplying important nutrients that can often be in short supply in an unhealthy gut. A varied diet rich in fruits, vegetables, nuts and seeds may provide such nutrients as ZINC, ANTIOXIDANTS like Vitamin A (beta-carotene), Vitamins C and E, FISH OIL (Omega 3), and the amino acid GLUTAMINE. Dietary ingredients and/or supplements to reduce inflammation in the lining of the gut include ginger, turmeric, green tea as well as the probiotics themselves.    5) REBALANCE: Pay attention to lifestyle choices - adequate water intake away from meal time, adequate sleep, exercise and modify stress. Sitting for meals (a desk is not a table!), mindful breathing before meals and an attitude of gratitude before the meal can improve digestion and well being overall. If suffering reflux symptoms, elevate the head of your bed, avoid eating too close to bedtime, give your stomach the gravity advantage- sitting upright- for several hours after eating. MBSR (Mindfulness-based stress reduction)  Charlene Brooke tapes, etc, help improve digestion since stress and digestion don't mix.    Further testing with specialty stool tests may help if simple measures do not yield the results you can feel.  If that's the case, let's talk further.            -----------------------------------------    Be well,  Phyllis Ginger, MD  [facility], Adjunct Associate Professor  Integrative/Functional Medicine Consultant  [facility]  [address]   [place], [place] [address]  [phone])  ( As of Jan 2019:  MD clinic hours Mon and Thurs all day, Tues pm and Fri am)    Integrative Medicine Program Director: Ester Rink, PhD  Integrative Medicine Clinical Director: Ancil Linsey  Patient Services Representative:    Chief Medical Assistant:  Ms. Ciro Backer  EXTENSION: [phone]  [x]               ================    Follow Up:  No Follow-up on file.  to review tests as specified in AVS    Phyllis Ginger, MD  Integrative/Functional Medicine Consultant  [facility], Adjunct Associate Professor  [facility]  [address]  [place], [place] [address]  [phone])      ===============================  AN ADDITIONAL __15__  MINUTES WAS SPENT COUNSELING FOR:  Above mentioned        Metabolic issues  Gut- Brain Axis issues  Immune regulation  Mood issues: Anxiety- Depression  Pain     ...advising on supplements, counseling on dietary and lifestyle modifications including use of restorative breathing practices, gentle movement, mindfulness for the balancing of emotional and physiologic/ hormonal stressors.

## 2017-06-09 ENCOUNTER — Other Ambulatory Visit: Admit: 2017-06-09 | Payer: PRIVATE HEALTH INSURANCE

## 2017-06-09 ENCOUNTER — Ambulatory Visit: Admit: 2017-06-09 | Discharge: 2017-06-09 | Payer: PRIVATE HEALTH INSURANCE

## 2017-06-09 DIAGNOSIS — R5382 Chronic fatigue, unspecified: Secondary | ICD-10-CM

## 2017-06-09 DIAGNOSIS — D8940 Mast cell activation, unspecified: Secondary | ICD-10-CM

## 2017-06-09 LAB — MAGNESIUM: Magnesium: 2 mg/dL (ref 1.5–2.5)

## 2017-06-09 LAB — C-REACTIVE PROTEIN: CRP: 17.8 mg/L (ref 1.0–10.0)

## 2017-06-09 LAB — T3, REVERSE: T3, Reverse: 14.8 ng/dL (ref 9.2–24.1)

## 2017-06-09 LAB — SED RATE: Sed Rate: 26 mm/hr (ref 0–30)

## 2017-06-09 LAB — IRON STUDIES
% Iron Saturation: 19.5 % (ref 15.0–55.0)
Iron: 73 ug/dL (ref 50–212)
TIBC: 375 ug/dL (ref 265–497)

## 2017-06-09 LAB — HOMOCYSTEINE, SERUM: Homocysteine: 8.8 umol/L (ref 5.0–12.0)

## 2017-06-09 LAB — FERRITIN: Ferritin: 46 ng/mL (ref 11.0–306.8)

## 2017-06-09 LAB — HEMOGLOBIN A1C: Hemoglobin A1C: 7.3 % (ref 4.8–6.4)

## 2017-06-09 LAB — THYROID PEROXIDASE ANTIBODY: Thyroid Peroxidase Ab: 747 IU/mL (ref 0.0–34.9)

## 2017-06-09 LAB — T3: T3, Total: 98 ng/dL (ref 60.0–220.0)

## 2017-06-09 LAB — METHYLMALONIC ACID, SERUM: Methylmalonic Acid: 369 nmol/L (ref 0–378)

## 2017-06-09 LAB — GAMMA GT: GGT: 53 U/L (ref 9–64)

## 2017-06-09 LAB — T4, FREE: Free T4: 0.81 ng/dL (ref 0.61–1.76)

## 2017-06-09 LAB — PTH, INTACT: PTH: 151 pg/mL (ref 12.0–88.0)

## 2017-06-09 LAB — TSH: TSH: 6.11 u[IU]/mL (ref 0.45–4.12)

## 2017-06-09 LAB — VITAMIN B12: Vitamin B-12: 163 pg/mL (ref 180–914)

## 2017-06-09 LAB — VITAMIN D 25 HYDROXY: Vit D, 25-Hydroxy: 10.5 ng/mL — ABNORMAL LOW (ref 30.0–100)

## 2017-06-09 NOTE — Unmapped (Signed)
-----   Message from Matilde Sprang V, DO sent at 06/03/2017  8:02 AM EDT -----  Angie, good morning. Please give Emily Bonilla a call advising her that her blood work from yesterday - IgG level being greater than 700 - is within the acceptable range. We should get alarmed if it drops below 400-500. The number from yesterday looks great! I will be seeing her in follow up in 6 months. Thanks, Casimer Leek      ----- Message -----  From: Interface, [facility] [place]  Sent: 06/02/2017  12:32 PM  To: Matilde Sprang V, DO

## 2017-06-09 NOTE — Telephone Encounter (Signed)
Pt informed of information below via MyChart

## 2017-06-09 NOTE — Unmapped (Signed)
THANK YOU FOR LETTING INTEGRATIVE MEDICINE PARTNER WITH YOU FOR WELLNESS.    Please know:  >That YOU are your best ally for healthy living.   > We want to be your partners, coaches, and cheerleaders.   > We want to help you achieve your health goals and we need your help to really understand WHAT YOU WANT YOUR HEALTH FOR...   What are your reasons to be more whole and healthy?   This story cannot be complete in one visit.  Please make another visit to take the next step with [place] if you wish to continue this partnership.  > Integrative Medicine clinicians cannot serve as your Primary Care doctors, here we are consultants to your [facility] docs. If you do not have a PC doctor, please let us know or check [email or web address]    >Your doctor/s cannot be with you 24-7 to see how you live, how you relate to others, what you eat, how soundly you sleep and how much you relax.  > If we're informed and honest with ourselves, there is much we can do for ourselves.  > If you feel we have not communicated well, please ask questions  > If we did not ask the right questions and you have some information that you even wonder may be relevant, please let us know.    Help [place] help you:  > Integrative Medicine at [facility] is a work in progress. We only began our expansion of services in mid October 2016, relocated in Jan 2019, and are still expanding and revising our processes    > Please sign up for UC MyChart if you haven't already, to help stay in communication with [place] between visits.  If you are not on-line with [place] at [facility], you will need to wait to discuss labs at visits    > Call our schedulers and staff at the [phone]) number for any Integrative Med needs that you cannot relay via MyChart    > If you appreciate the advice and care provided, we welcome your referrals to [place] in Integrative Medicine which will help to support and expand our services.    Be well,  LEN  [email or web address]  [email or web address]      INTEGRATIVE MEDICINE CHECK LIST   Please refer to the following outlines to refresh your recall of the wellness advice and further testing we discussed.        TO DO LIST:    []  WE NEED MORE INFORMATION:  Please bring to next visit:   TIMELINE DATA    OLD LAB TEST REPORTS      [x]  BLOOD TEST ORDERS PLACED:  If  Instructed to do this at the end of your visit today, please use our [place] office, 1st floor.  If you've been instructed to wait until fasting or early in the day, you may use any UC lab site. Please see [email or web address] for sites and times.    UC drawn labs will be communicated via email or at upcoming visits.     BUT:    ** If you have blood drawn at a non-UC site, we will not be able to see or digitally capture those results and may only be able to review them with you at your next visit. Please bring a copy or make sure that a copy of your results are faxed to [place] at [phone]       IT IS OUR STRONG INTENTION TO RESPOND TO YOU ABOUT YOUR YOUR LAB RESULTS  THROUGH YOUR  UC MYCHART PORTAL WITHIN A WEEK OF ALL YOUR LAB RESULTS COMING IN IF NOT SOONER.  PLEASE CHECK YOUR MYCHART WITHIN A WEEK AFTER DOING YOUR BLOOD WORK AND LOOK FOR A NOTE FROM DR Arlette Schaad.      []  GIMAP STOOL TEST KIT,  (results not visible in UC MyChart)   Kit provided at office, you send from home or FedEx  Please make sure you have an appointment for 3-4 weeks from the date of submitting your stool test, as this MUST be reviewed at a visit  and results are NOT visible in MyChart. Please do not wait for [place] to let you know the test is back and it will NOT show up in MyChart.  >>??????????????      []  NEUROLAB/ [place] (results not visible in UC MyChart)   Urine and Saliva testing for Neurotransmitters (GABA, Serotonin, Dopamine, Adrenaline, Glutamate) and Hormones (Cortisol and DHEA).    A send from home kit, via [employer], turn around time 2 weeks. Please be sure to have an appointment to review this within 2-4 weeks of sending off the  kit.  >>      []  [facility]  GDX.[email or web address] (results not visible in UC MyChart)   NutrEval, Salivary hormones kit/s, Urine Hormone metabolites, Allergy Panels  >>        []  DIETICIAN VISIT     Note:  AFTER December 20, 2016, OUR DIETITIAN SERVICES WERE SUSPENDED  However, we do encourage you to have a Dietitian on your team.   Our former dietitian now has her own business and can see patients in the [facility] area:  Check her website for costs and specifics.    PREETI BANSAL KSHIRSAGAR, RD  [email or web address]  [phone]  Or  Jarrett Ables, RD  [email or web address]  [phone]     []  BEHAVIORAL HEALTH/ COUNSELOR VISIT Arnetha Massy, PhD)  Many insurances may cover; please check with your carrier) Dr Dan Humphreys can see you for 1-5 visits depending on your needs but is not a long-term-needs counselor; her specialty is MBSR (Mindfulness-based stress reduction), skill building, and performance improvement.   Biofeedback tools ([employer]) are often utilized.  >>KEEP UP  WITH DR W!    []  REFERRAL TO:      Integrative services like Massage (Polly Thomasena Edis), Acupuncture (Derek Laural Benes or Celine Ahr)  Get costs and schedule at our front desk or by calling [phone]. Special arrangements may be negotiated.    Or,   SPECIALIST/S within UC:  >>         []  FULLSCRIPT (on-line supplement dispensary)   If an order initiated during your visit-- check your personal email (or spam folder)  Dr Valeda Malm may wait until lab results are seen to complete an order, please let us know if that doesn't happen and you were waiting for it.  >>      []  SUPPLEMENT CHANGES OR ADDITIONS:    Please review recommendations and TLC (Therapeutic Lifestyle Changes).   We advise gradual changes, staggered entry of new components in order to determine pros/cons of various interventions. It took time to get out of balance and restoring balance may be a long, worthwhile journey!    >>      []  Rx / PRESCRIPTION written for:  >>      [x]    FOLLOW UP  VISIT   No Follow-up on file.  Please make appointment to go over any lab work, don't wait for a reminder from [place], especially for nonUC  tests that you will not see in MyChart.  For each follow up appointment, we will be asking you to complete one or two electronic questionnaires before your visit so please check your UCMyChart in the days prior to your next visit and complete these important symptom questionnaire/s (MSQ). These help [place] partner with you for your health more efficiently  Please bring your Integrative Medicine folder, all prior handouts and test reports as well as updated lists of medications and supplements (bottles or pictures of labels) to each visit, since electronic access to your data is not as easy as it should be!        [x]  MIND BODY:  MAKE TIME FOR QUIET BREATHING, DISCONNECT FROM PHONE/TEXT/COMPUTER, SPEND TIME IN NATURE, GRATITUDE JOURNAL'  >>MINDFULNESS TRAINING COURSE, CHECK WEBSITE FOR LATEST CLASS SCHEDULE    >>         [x]  GUT HEALTH:  EAT REAL FOOD, NOT TOO MUCH, MOSTLY PLANTS!  >>      [x]  MMUNE/ ANTI- INFLAMMATION SUPPORT:  TO HELP YOUR BODY DEFEND ITSELF, LIMIT SUGAR TO HELP STRESS SIGNALS, SLEEP TO HELP BRAIN REPAIR,  >>      [x]  HORMONE SUPPORT (ADRENAL, THYROID, INSULIN, GENDER):  SUGAR CAN CAUSE COMMUNICATION IMBALANCES, VITAMIN D IS A HORMONE WE MAKE FROM THE SUN!  >>      [x]  ENERGY REGULATION:  NUTRIENTS SUCH AS VITAMIN B2, B12, IRON AND OXYGEN ARE ESSENTIAL FOR MITOCHONDRIA (YOUR BATTERIES)  >>      [x]  DETOX SUPPORT:  HEALTHY GUT TRANSIT IS THE BEST DETOX! IF THE GUT ISN'T ABLE TO DO IT'S JOB, THE LIVER WILL HAVE TO TAKE UP THE SLACK!  >>    [x]  TRANSPORT/ STRUCTURE:  GETTING NUTRIENTS THROUGHOUT THE BODY AND HOW WE MOVE THROUGH SPACE ALL CONTRIBUTE IMMENSELY TO OVERALL HEALTH. ...CONSIDER YOUR SPINE AND JOINTS AS STARS IN A CONSTELLATION. MAKE AS MANY CONSTELLATIONS THROUGH YOUR DAY AS POSSIBLE NOT JUST A STATIC FEW POSITIONS!  >>            HANDOUTS:    Why a  Functional Medicine Approach?    When environmental inputs (water, air, nutrients, exercise, stress, xenobiotics, microorganisms, radiation) are processed through a person???s unique metabolism, influenced by our genetic tendencies (the cards we're dealt), and directed by how we play our cards (social environment, spiritual, emotional and mental beliefs, attitudes and feelings), then transformation occurs.      Functioning optimally, food becomes energy and information. Structural wholeness is maintained from a cellular level to the whole body, wastes are eliminated, defense against invaders is mounted, nutritional data packets are transported throughout the body and Health is achieved.    But, when the system breaks down anywhere, instead of health as the outcome of biological transformation, we experience core clinical imbalances that may cross many organ systems and may be detectable long before obvious disease is diagnosable. Prevention and rebalancing at this stage is the goal.     However, when the system is already way out of balance signaled by symptoms such as indigestion, heartburn, excess gas, too frequent or infrequent bowel movements, fatigue, pain, depression, sleep disturbance, etc, more attention to the triggering factors or mediating deficiencies must be addressed in order to restore balance.    Nutrition is one of the key environmental inputs that can be influenced to support optimal health and function.  The gastrointestinal system that carries out our functions of digestion and absorption of food is connected to our nervous system (think of nervous ???  butterflies??? sensations) and both are linked through cells of the immune (defense and repair) system. As such, the gut is the alarm system and starting point for many chronic symptoms far from the intestinal tract.     A glitch in the digestive, absorption or elimination steps can lead to sickness and because of the interconnections with the body???s  nerves and immune mechanisms, all sorts of triggers can set in motion, dis-ease.    For this reason, no matter where health problems seem to originate, wisdom and experience dictate starting with the gut. Especially if the gut is crying out with symptoms of being ill at ease, aiding digestion and absorption is the place to begin the healing journey. It is critical to address emotional and physiological stress which regulate hormones (such as cortisol and insulin) and influence gut function. It is necessary, as well, to identify ways in which those same hormones are influenced by gut and nutritional balance or imbalances, an intricate symphony easily out of tune when both factors are not addressed simultaneously.   For this reason, the ???5 R??? factors to restoration are pivotal.  (continued on [employer] handout).    Gery Pray said: ???The doctor of the future will give no medicine but will interest his (or her) patients in the care of the human frame, in diet and in the cause and prevention of disease.                                                                GUT RESTORATION                                                   GI 5-R PROGRAM  An unhealthy intestinal tract affects many other organs and systems in the body, including energy levels, mood, inflammation, weight and immune defenses. With a healthy gut, you can better absorb the nutrients you need and keep out harmful substances.    1) REMOVE:    a) Reduce any food triggers, most common may be DAIRY and/or WHEAT products; a 21-30 day elimination is the best way to know for sure because even negative or normal blood tests do not substitute for how you feel OFF a food,   b) Get rid of harmful bacteria, parasites or toxins that may be in the gut which may include certain Rx or herbs (this may require special stool tests)    2) REPLACE: Many stomach problems are actually from too little acid rather than too much. GERD is not necessarily too  much acid but acid/stomach contents where they don't belong, in the esophagus. Many prescription medications reduce our first line of defense and optimal digestion. Refined and processed foods don't have the natural enzymes for digestion that whole foods do. Whether or not you are aware of digestion issues, you may need a combination of enzymes to better digest proteins and fats such as PAPAIN (papaya) and/or BROMELAIN (pineapple) as well as BETAINE HCL along with each meal. If the gall bladder is missing or poorly functioning, BILE may need to be taken with some meals.     3)  REINOCCULATE: There is growing data on the critical, beneficial relationship we have with our own gut bacteria, 4 - 8 pounds worth. They help [place] digest food, protect and repair the gut, make certain hormones and nutrients for [place]. We harm them by our lifestyles, dietary indiscretions and many medications. Help your symbiotic resident beneficial bacteria thrive by taking in probiotic foods or supplements that contain the so-called ???good??? bacteria such as BIFIDOBACTERIA and LACTOBACILLUS and some good yeast species such as SACCHAROMYCES, and by consuming the high soluble fiber foods that good bugs need to survive, called ???prebiotics.???  Look for a heat stable product that contains 10 to 200 billion CFU (colony forming units) and use a variety of products over time.  Diversity is important.    4) REPAIR: Help the lining of the GI tract repair itself by supplying important nutrients that can often be in short supply in an unhealthy gut. A varied diet rich in fruits, vegetables, nuts and seeds may provide such nutrients as ZINC, ANTIOXIDANTS like Vitamin A (beta-carotene), Vitamins C and E, FISH OIL (Omega 3), and the amino acid GLUTAMINE. Dietary ingredients and/or supplements to reduce inflammation in the lining of the gut include ginger, turmeric, green tea as well as the probiotics themselves.    5) REBALANCE: Pay attention to lifestyle choices -  adequate water intake away from meal time, adequate sleep, exercise and modify stress. Sitting for meals (a desk is not a table!), mindful breathing before meals and an attitude of gratitude before the meal can improve digestion and well being overall. If suffering reflux symptoms, elevate the head of your bed, avoid eating too close to bedtime, give your stomach the gravity advantage- sitting upright- for several hours after eating. MBSR (Mindfulness-based stress reduction)  Charlene Brooke tapes, etc, help improve digestion since stress and digestion don't mix.    Further testing with specialty stool tests may help if simple measures do not yield the results you can feel.  If that's the case, let's talk further.            -----------------------------------------    Be well,  Phyllis Ginger, MD  UC Dept [facility], Adjunct Associate Professor  Integrative/Functional Medicine Consultant  [facility]  [address]   [place], [place] [address]  [phone])  ( As of Jan 2019:  MD clinic hours Mon and Thurs all day, Tues pm and Fri am)    Integrative Medicine Program Director: Ester Rink, PhD  Integrative Medicine Clinical Director: Ancil Linsey  Patient Services Representative:    Chief Medical Assistant:  Ms. Ciro Backer  EXTENSION: [phone]  [x]

## 2017-06-21 ENCOUNTER — Ambulatory Visit: Admit: 2017-06-21 | Discharge: 2017-06-21 | Payer: PRIVATE HEALTH INSURANCE

## 2017-06-21 DIAGNOSIS — F439 Reaction to severe stress, unspecified: Secondary | ICD-10-CM

## 2017-06-21 NOTE — Unmapped (Signed)
In: 11am  Out: ??1155am  ??  Chief Issue: stress/anxiety about health??  ??  Subjective:   Pt discussed additional symptoms that she has from her disordered health. ??discussed appointment with Dr. Valeda Malm and the results that she discovered. Multiple deficits, which can be changed over time, which gave her hope.  Feeling more overwhelmed than usual and has been feeling down.  Practiced a meditation exercise within session and was able to relax and clear her mind well.  wantst to focus more on this and breathing at next session. Has been utilizing Insight Timer app and practicing this twice/day.  Has also ordered services through home health care for a dietitian to help her know what foods to choose to improve her health.   ??  Objective:   Very optimistic and positive on the outside especially with what she is managing health and financially.  MSE  Motor Behavior:??no psychomotor abnormalities  ??  Cognition: alert  ??  Attitude: cooperative  ??  Affect: sad, tearful, appropriate to thought and anxious  ??  Appearance: appropriately dressed  ??  Speech: normal rate  ??  Mood:??depressed, consistent and anxious  ??  Thought Processes:??well organized and no looseness of association??  ??  Perceptions:??no hallucinations or other abnormalities????  ??  Thought content:??no delusions, low self-esteem and demoralized  ??  Insight/ judgement:??no impairments  ??  Suicidal ideation:??none  ??  Homicidal ideation:??none  ??  Symptoms observed during session:  Agitation,??anhedonia,??anxiety/fear,????decreased energy/fatigue, mildly??depressed,??emotional lability, sleep disturbance, restlessness,??sad/pained/worried expression,Open coachable motivated  ??  Assessment:   ??  Intervention strategies implemented and session focus was cognitive behavioral,??insight-oriented,??behavior modification,??supportive,??mindfulness strategies  ??  Plan:   ??  Homework:   Practice breathing techniques  Continuing physical exercise  Follow bedtime routine with  consistency  Continue to seek out groups that she can potentially get involved in  ??  Future treatment/follow-up issues:  ??two weeks.

## 2017-06-21 NOTE — Unmapped (Signed)
Patient calling to speak with provider in regards to starting her with home health.  She can be reached at [phone]

## 2017-06-27 ENCOUNTER — Ambulatory Visit: Admit: 2017-06-27 | Payer: PRIVATE HEALTH INSURANCE

## 2017-06-27 DIAGNOSIS — D8940 Mast cell activation, unspecified: Secondary | ICD-10-CM

## 2017-06-27 NOTE — Unmapped (Signed)
[facility]               FOLLOW UP PATIENT VISIT NOTATIONS                  For Emily Bonilla                               Clinician: Phyllis Ginger, MD    Chart notes (UC, and CareEverywhere), Problem List, Labs, Allergies and Medications are reviewed in preparation for current visit.    PCP: Court Joy, MD  Referred by: DR Arnetha Massy, PhD    -----------  HPI:  Emily Bonilla is a 57 y.o. who is here for her follow up consultation with [facility].    Patient's initial reasons for seeking consultation:      1. Mast cell activation syndrome (CMS Dx)  Surgical Oncology    Endocrinology    ENT   2. Multiple allergies  Surgical Oncology    ENT   3. H/O severe allergy with RECURRENT ANAPHYLAXIS  ENT   4. Chronic diarrhea     5. CVID (common variable immunodeficiency) (CMS Dx)  Surgical Oncology    Endocrinology   6. Hashimoto's thyroiditis     7. 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 At risk for dehydration  Surgical Oncology   9. Hyperparathyroid bone disease (CMS Dx)  Endocrinology    ENT    PTH 150   ENT/DR STEWARD SEEN, REC'D ADDRESSING LOW VIT D FIRST   10. Other osteoporosis without current pathological fracture  Endocrinology    ENT   11. Impaired glucose regulation     12. Elevated C-reactive protein (CRP)       No problems updated.  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 Dr Rogelio Seen( Allergist), Dr F Awan(hematology), Dr Leonard Downing( neurologist). ??Although, I would go to [facility] as needed for tweeks in my care.   IV fluids were very important to keep my body stable. ??It's amazing how good I feel after getting fluids. ??When my body stresses my Mast cells flair, I flush more  and have increased diarrhea which is not helpful for fluid retention, increased inflammation, brain fog, extreme fatigue, BP drops with passing out( syncope) and until the last 2 years my lungs were very bad. ( I'm appreciative that the last two years my lungs have been great!!), itching, VCD, MTD issues, and two more Life flight transports needed in 2009, 2011 both while on vacation out of state. I had severe reactions that could not be controlled by local ER so I was air vac to hospitals that knew how to treat Mast Cell Activation. ?? In 2007 Dr Carmon Ginsberg Awan(he is at [facility] now)Hematology was brought onto my care. ??   He evaluated me from top to bottom. ??Found that I would do better on IVIG, IV fluids, IV iron and magnesium. ?? B 12 levels fluctuated but never as low as they are now. ??I Always had issues with folic acid levels. But are ok now.   Back to Dr Nelle Don after my veins developed so much scar tissue and  Emergency Response teams could not get IV in during emergencies and the IVIG replacement needed my first port was put in 2008. ??I had that port until 2013. A new port was put in on opposite side in 2014 due to sluggish Infusions. [facility]) put me on Daily IV fluids1000 cc of LR, IV push benadryl q4-6 hours via my port. ??Steroids as needed. ??I was in a awful flair for several years. ??As hard as it was to keep up with the regiment ??I felt so much better and was able to enjoy life more, see friend and travel.. The summer of 2015 was when I kept having increased seizures and syncope in grocery stores produce dept( [employer] and bi lo) the seizures wer increasing big time. ??So the Allergist put me back on doxipen and steroids with my IV benadryl and IV fluids I got better. June 2017 broke legs after syncope from smelling perfum. Allergic reactions to O2 mask and antibiotics and   pain meds triggered symptoms and my face, lower lip, eyes swelled and then became infected. ??I was  hospitalized 3 months for face surgery, left tibia fib multipal break and mast cell reaction. CVID was kicking in as my IgG levels were 319. My port was taken out and a pic line put in just incase my port compromised. ?? My port came back clean from infection. ??After I was discharged from hospital to rehab nursing home to slowly ambulate as I had not been at bearing for 4 months. ??I stayed 3 weeks as they had cockroaches and could not take care of my dietary needs. ??I left and rehabbed myself. ??Since then I have had flairs related to allergy to hardware to left tibia fib and the transition off my IV fluids and trying to find a hospital that could treat me/not afraid of my conditions. ??That took 4 years!!!!!!!! ??     During all the medical issues I was going through a divorce Oct 2012.   That was rough transission as my drs of 14 years retired and/or left [employer] [place] after affordable health care act started to be implimented.   Lots of changes and I went to get psychotherapy weekly to bi weekly for the next 4 years. ??This was very important to my over all health. My ex and I went to marriage counciling 4 years weekly to deal with the stressors of my illness on our marriage before the divorce.     -------------------------------------  ROS/ SYMPTOM TRACKER (IFM MATRIX REVISED)   X= Moderate risk domains; XX= Higher risk domains  O= Lower risk domains  CC (pt's own goals):  Identifying possible triggers of inflammation   Inflammation,   Pain   Anaphylaxis       Patient care team:  Patient Care Team:  Court Joy, MD as PCP - General (Internal Medicine)  Christena Flake, DO as Consulting Physician (Allergy and Immunology)     06/27/2017    First visit: 06/09/2017       LABS    X  Several abnl metabolic markers       There is no immunization history on file for this patient.    No results found for: HMCOLON, HMSIGMOIDOSC, HMPAP, HMMAMMO, HMDEXASCAN, HMDIABFOOTEX, HMDIABEYEEX       REVIEWED RECENT UC  LABS    >>GIMAP IF MOLINA WILL COVER  >>B12 INJ  >>HEM ONC EVAL FOR ASSIST WITH MGMT/ PORT CareEverywhere reviewed:  EXAM      Several exam findings        HERE ALONE  ATTENDANT IN LOBBY      MSQ  (Medical Symptom Questionnaire)  Subjective assessment of symptoms, ideally reduced during course of therapy    129 152  MSQ > 30         From Functional Med Matrix       SOCIAL /MENTAL:   X  Several historic or current challenges to emotional wellbeing, and/or psychological balance    Including hx ACE, emotional traumas, safety issues, anxiety, depression, etc      COMPLEX HEALTH STORY  ANXIETY  MAST CELL ISSUES    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, [employer], [place]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    RAISED BY  MOM/ STEP DAD IN [place]    HX OF WORK AS ICU NURSE    COGNITIVE IMPAIRMENTS WITH ANAPHYLAXIS    G1P1 (DAU '85)  GDAU '17 SICK BABY    WT UP 70# IN PAST 8 MONTHS (CAN ONLY ACCOUNT FOR 50# FROM AUG'18 IN RECORD)     HAS EMERGENCY GMS MONITOR, PERSONAL ON STAR  ALSO HAS AIDE TO ALL APPTS    HAS MOVED FOR HEALTH CARE MANY TIMES OVER PAST 7 YRS     ASSIMILATION/ Digestion:   X  GI / Digestion & Absorption issues       Issues considered include history/current use of NSAID, PPI, H2Bs  increasing intestinal permeability;  Surgical interventions such as Cholecystectomy, Appendectomy, partial or more extensive regional intestinal resections,compromising digestive resilience,  Diagnoses of GERD/HH/ IBS vs IBS    HX CHOL'Y '15  ?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  IgG food testing:  No results found for: AIGGCASEIN, AIGGWHEAT, AIGGOAT, AIGGMAIZCORN, AIGGPEANUT, AIGGSOYBEAN, AIGGEGGWHOLE    FOOD SENS TESTING IN '98 ([facility])  FINDING PRESERVATIVE/REACTIVITIES    GIMAP/PCR stool testing:     ONGOING CHR DIARRHEA    >>GIMAP   BM 15X THIS AM,  2 KINDS OF DIARRHEA SINCE CHOL'Y    Ulcers;Colitis;Gallbladder Disease;Diarrhea;Gastroenteritis (nausea and vomiting)    HX CHOL'Y '15  ?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    ABD BLOATING  WT GAIN,  5#s UP AND DOWN,    Mag:   Magnesium   Date Value Ref Range Status   06/09/2017 2.0 1.5 - 2.5 mg/dL Final     No results found for: MAGNESIUMRB  OPTIMAL: Serum mag >2.1; RBC mag >6     DIET/ >>>>>>>>>>>>>>>>>>>  NUTRITIONAL BACKGROUND  OFF RICE, SOY  LIMITS GLUTEN,   MAKES OWN BREAD  EGGS SEEM OK       DEFENSE/ REPAIR/ Immune-Inflamm:  XX  Risks for infections or inflam/   autoimmune issues    Issues considered include chr/recurrent NSAID use, recurrent illnesses and/or repetitive antibiotic use, needs for chronic or intermittent topical/oral/ inhaled/ injectable streoids; personal and family history of Inflammatory and/or Autoimmune disorders,      NEEDS FLUID FOR MASTOCYTOSIS  >>DR SYED AHMAD FOR ?PORT    HIGH CRP  Lab Results   Component Value Date    CRP 17.8 (H) 06/09/2017      HX:  5+ ABX PAST 5 YRS   10+ IN LIFE   Pneumonia, sinus infection, skin infections  BITTEN BADLY BY RED ANTS AGE 51,    5+ STEROID COURSES   Mast Cell Activation Syndrome,     MAST CELL DEGRANULATION ISSUES  (WALTER REED) DX '96 (DR HARBUT)  FAM HX MAST CELL ISSUES (  MAT AUNT D.58, MAT UNCLE, MAT GF D. 49)  VOCAL CORD DYSKINESIA,  S/P HELIOX TX,     ANAPHYLAXIS > LIP ANGIOEDEMA > SURGERY TO REPAIR    MONTHLY IVIG, SINCE '08 -'14  BEST IgG 700s  '14-'17 PATCHWORK DOCS WITH HYZINTRA  '18-PRESENT HAS A TEAM AT UC  OFF IVIG PAST 2 YRS AND AND WT HAS GONE UP FROM 89# 2 YRS AGO    BOTOX Q3 MO FOR PAIN MGMT      Lab Results   Component Value Date    WBC 12.6 (H) 05/31/2017   WBC:  %N: 59.7  %L: 27.7  %M: 11.1  %E: 1.1  %B: 0.4    No results found for: HSCRP,   Lab Results   Component Value Date/Time    CRP 17.8 (H) 06/09/2017 1549     OPTIMAL: CRP<1  Lab Results   Component Value Date/Time    ESR 26 06/09/2017 1549    ESR 29 05/31/2017 1118     No results found for: SEDRATE  No results found for: ANA  No results found for: RF     ENERGY/ mitochondria:   X  Cellular Energy compromises    Issues considered include conditions potentially causative  of mitochondrial dysfunction,  compromise of Krebs/ electron transport, ATP production and oxidative stress  Factors including chronic /recurrent cephalgia, central and/or peripheral neurologic deficits, balance issues; nutrient deficiences such as cofactors for ETC: B2, B6, B12, folate, CoQ10, iron or diets deficient in antioxidants     B12 VERY DEFICIENT BUT CONCERN FOR ANAPHYLAXIS AND RECOMMEND A NON-PRESERVATIVE PRODCT  >>METHYL B12 COMPOUNDED FROM TRISTATE  >>MONITOR W/ NURSE FOR FIRST INJECTION    HAS BENEFITTED IN PAST REPORTEDLY FROM IRON INFUSIONS, ONLY CLUE WOULD BE LOW END MCV.Marland Kitchen  GIVEN HIGHER POTENTIAL FOR ANAPHYLAXIS, DEFER IRON OPTIONS TO HEMONC      SEIZURES   LOC    SZ IN GROCERY STORE/S > HOME DELIVERY    Lab Results   Component Value Date    HGB 14.8 05/31/2017    HCT 45.2 (H) 05/31/2017    MCV 84.9 05/31/2017    PLT 317 05/31/2017       Lab Results   Component Value Date    VITAMINB12 163 (L) 06/09/2017     Lab Results   Component Value Date    FERRITIN 46.0 06/09/2017     Homocysteine   Lab Results   Component Value Date    HOMOCYSTEINE 8.8 06/09/2017   ,(Hcy OPTIMAL <7):     MMA :  (OPTIMAL <150)  CoQ10 :  (OPTIMAL >0.86)     COMMUNICATION/ hormones:  XX  Hormonal concerns  Evidence of cellular communication compromise,    Issues considered including HPA Axis imbalance, autonomic dysfunction, glycemic dysregulation, centripetal adiposity, thyroid and/or gonadal dysfunction      BROTHER BRITTLE DIABETIC (DM2)  MOTHER THYROID CANCER, SEV OTHER RELATIVES,    HX DUB, > HYST '09  HX SHOCK/ COLLAPSE DAY 1-2 OF PERIODS    THYROID INFLAMES  ON RX T4 110  Lab Results   Component Value Date    TSH 6.11 (H) 06/09/2017    T3TOTAL 98.0 06/09/2017    FREET4 0.81 06/09/2017    THYROIDAB 747.0 (H) 06/09/2017     Lab Results   Component Value Date/Time    T3REV 14.8 06/09/2017 1549     TT3/ RT3: __  (TT3/ RT3 OPTIMAL 6-10)  Lab Results   Component Value Date  HGBA1C 7.3 (H) 06/09/2017   (OPTIMAL <5.3)      ELIMINATION/ detox:   XX  Phase I/ II Detox pathway, GI or renal elimination risks    Issues considered include poor phytonutrient provision and hydration, gastrointestinal dysfunction, slow transit compromising first line detox as well as hepatic overload from environmental exposures, tobacco 1st-3rd hand, EtOH, hobbies, dental amalgams, and genomic uniqueness such as SNPs for COMT/SOD, etc   .  MULTI CHEM SENSITIVITIES    Lab Results   Component Value Date    GGT 53 06/09/2017   GGT (OPTIMAL < 30)    >>L-GLUTATHIONE/ NAC OPTIONS      MULTIPLE CHEM SENSITIVITY  USES ION PERSONAL CARE PRODUCTS ,    Lab Results   Component Value Date    ALT 14 10/28/2016    AST 16 10/28/2016    GGT 53 06/09/2017    ALKPHOS 110 10/28/2016    BILITOT 0.3 10/28/2016     GGT (OPTIMAL < 30)  URIC ACID No results found for: URICACID  Optimal < 6   STRUCTURAL INTEGRITY:  X  Structural issues     Issues considered include birth traumas, spinal segment compromises, MVA, fractures, repetitive motion injuries and persistent ergonomic stressors      DENTAL SURGERIES    R TIB FRX > TITANIUM, REACTIVE. AND TOOK OUT TITANIUM 10 MO AGO    DENTAL SURGERIES    R TIB FRX > TITANIUM, REACTIVE. AND TOOK OUT TITANIUM 10 MO AGO    HX PORTS  PLACED POST OP LIP ANAPHYLAXIS    Lab Results   Component Value Date    VITD25H 10.5 (L) 06/09/2017     (OPTIMAL 50-60)  PTH:   Lab Results   Component Value Date    PTH 151.0 (H) 06/09/2017    CALCIUM 9.4 05/31/2017   Optimal PTH <30     TRANSPORT / CV:  X  Circulatory concerns    Issues weighed include evidence of vascular compromise, inflammatory contributors to endothelial dysfunction; hypertension, coagulation defects, anticoagulation, medications causing dysregulation of vascular stability   Vitals:    06/27/17 1522   BP: 138/82   Pulse: 84   SpO2: 96%       Wt Readings from Last 3 Encounters:   06/29/17 (!) 222 lb 4.8 oz (100.8 kg)   06/29/17 (P) 222 lb (100.7 kg)   06/27/17 221 lb 3.2 oz (100.3 kg)         HX:   WPW  WT GAIN    RX:   Vitals:    06/09/17 1348   BP: 132/68   Pulse: 87   SpO2: 97%     Wt Readings from Last 3 Encounters:   06/09/17 218 lb 9.6 oz (99.2 kg)   06/07/17 217 lb (98.4 kg)   06/02/17 217 lb 3.2 oz (98.5 kg)   WT 165  8/'18     8  of 8 Risk Factor Domains                          THERAPEUTIC LIFESTYLE CHANGE (TLC) TRACKER      06/27/2017    Initial visit: 06/09/2017        CHANGE READINESS:  Precontemplation, contemplation, preparation, action, and maintenance PREP PREP   MOVEMENT  Assessing for compromises including sedentary lifestyle or work, impaired mobility, or energy   MOVEMENT:      1         2  3         4        5        X     low risk     >>>>>>  higher risk       SLEEP  Assessing compromises including difficulty with sleep onset, maintenance or quality, or quantity <7 hours routinely, shift work, dark/light reversal    EPWORTH:  No flowsheet data found.    SLEEP:     1         2       3         4        5       X     low risk     >>>>>>  higher risk    NEWER PUFFING WITH SLEEP SINCE WT GAIN  SUMMER '18     RELATIONSHIPS & SUPPORT NETWORK:  Assessing support for emotional health as well as initiating and maintaining TLCs    RELATING:      1         2       3         4        5       X     low risk     >>>>>>  higher risk    CHURCH  DAU  BABY GRANDDAU     EMOTION INVENTORY:  Assessing concerns for historical or ongoing physical or emotional safety, poor self efficacy or esteem, labile mood or frequent anxiety, dysthymia    EMOTIONS:        1         2       3         4        5       X     low risk     >>>>>>   higher risk   TOX/DETOX  Assessing concerns for former or current tobacco or excessive  EtOH, or substance exposures; occupational or hobby related xenobiotic or VOC*/ heavy metal exposures:  Chemotherapeutic agents; Rx polypharmacy      *VOC: volatile organic compounds/ or Hazardous materials  Resources cited: [email or web address]  DETOX:     1         2       3         4        5             X   low risk     >>>>>> higher risk   RESTORATIVE PRACTICES:  Assessing concerns for sufficiency of self care practices, creation of quiet, implementation of mindfulness, restorative breathing practices, touch, disconnection from [place], etc      MBSR      1         2       3         4        5        X     low risk     >>>>>    higher risk    STARTED WITH MEDITATION IN '98,   AWAKENS QUIET,  AM MEDITATION NOW  HAS APPS ON PHONE, INSIGHT TIMER  Tend flowers, meditation, watch [employer] station, play memory games   [place], grandchildren, flowers, nature, travel, family.   >>MBSR TOOLS, Rx   DENTAL  Assessing  concern for hygienic, surgical,  or ongoing ORAL Inflammatory/ Infectious processes      DENTAL:      1         2       3         4        5           X    low risk     >>>>>    higher risk    Grinding;TMJ;Gum Disease;Root Canal/s   All done from 2004-2012.  April 2019 oral surgery to remove a decayed tool part of a 3pc bridge. Went to OR and intubated and over night in hospital.      HYDRATION:   Assessing sufficiency of water intake, through free water and/or food fluid    >>Advised on hydrating to point of light yellow urine  H2O:     1         2       3         4        5       X     low risk     >>>>>    higher risk  Currently at  - - oz water daily    10 X 16 OZ WATERS DAILY,  SO DRY,  MISSING IVF  PER PORT    DESIRES:  IVF  FLUIDS LR 500 CC/NIGHT >  X 30D THEN 1000 CC Q 3D     DIET/ NUTRIENTS:  Assessing phytonutrient intake levels, individual's of awareness and implementation of food as medicine, their use of whole foods, phytonutrient diversity, avoidance of high refined sugar/ processed foods      DIET:       1         2       3         4        5        X     low risk     >>>>>    higher risk    OFF RICE, SOY  LIMITS GLUTEN,   MAKES OWN BREAD  EGGS SEEM OK   SUPPLEMENTS:                                        FS    HIGHLY PRESERVATIVE SENSITIVE!!     RX   T4 112  DOXEPIN 10MG  26MG  (  ZYRTEC UP TO  6/D  PPI OR PEPCID    BENEDRYL 150 MG/D  SINGULAIRE  XOPENEX  ALLEGRA  BUSPAR    >>mB12 TRISTATE  >>IVF/ HEM ONC REF  >>L-GLUTATHIONE VS NAC RX   T4 112  DOXEPIN 10MG  26MG  (  ZYRTEC UP TO 6/D  PPI OR PEPCID  BENEDRYL 150 MG/D  SINGULAIRE  XOPENEX  ALLEGRA  BUSPAR    NO IVF   USED TO GET 500>1200 ML    SUPP:  OFF  TRIED MANY PROBIOTICS         KEY:  >>: POC;  Sx/RISKS 1  LOW < <--5  HIGH   C: CONTINUE  S: STABLE  R!: RESOLVED!  P: PERSISTENT  E: EPISODIC  W: WORSE  B: BETTER/IMPROVED  MBSR: MINDFULNESS BASED STRESS REDUCTION   ZO:[id number] ENZYMES  VOC: VOLATILE ORGANIC POLLUTANTS  ---------------------  INTEGRATIVE MED FLOW SHEET  New Patient Md Consult Questionnaire 05/12/2017  Which level of study have you completed? Two-year college   Other Source: Meditation intergrative med dr refered me   Overall health? Fair   Goals? Identifying possible triggers of inflamation   1st (of 3) Top Health Concern: Inflammation,   2nd Top Health Concern: Pain   3rd Top Health Concern: Anaphylaxis   Maximum Height: 5'7   Maximum Weight: 212   Minimum Adult Weight: 89   Weight Recently Up/Down: Up   Courses of antibiotics in the past 10 yrs: 5+   Courses of antibiotics lifetime: 10+   Type of infections: Pneumonia, sinus infection, skin infections   Steroid/Prednisone courses (oral/injection) past 10 yrs: 5+   Type of conditions: Mast Cell Activation Syndrome,   # of current prescriptions? 5 or more   Multivitamins? No   Individual Vitamins/Minerals? No   Digestive Aides? No   Energy Supplements? No   Liver Support Supplements? No   Anti-Inflammation/Pain? No   Mood/Sleep Supplements? No   Allergy/Cold/Sinus? Yes   Antihistamines (Claritin,Zyrtec) Yes   Brand: Certirizine, Gluten free Members Marks( SAMS BRAND)   Dose - Frequency 1 @night .  I can increase up 6 a day during severe flairs   Used Since 15+ years   Nasal Sprays No   Mucinex (Guaifenisin) No   Herbal - Elderberry No   Herbal - Echinacea No   Allergy/Cold/Sinus Other Yes    Brand: Benadryl, Members Marks (sams)   Dose - Frequency 150 mg a day. Dose changes fro 25 mg to 300mg  daily   Used Since 25 years +   Herbs/Tinctures/Essential Oils No   Physical therapy? Yes   Helpful? Yes   Currently use? No   How often? When I broke legs therapy. I rehab myself after Rehab center was inadequate    Talk therapy/counseling? Yes   Helpful? Yes   Currently use? Yes   How often? Dr Verna Czech, Meditation works best for me.   Who/where? Dr Dan Humphreys, [facility].  Meditation works best for me.   Massage therapy? Yes   Helpful? Yes   Currently use? No   How often? Insurance does not [employer].   Who/where? [place] [facility] tergrative tgerapy   Yoga? Yes   Helpful? Yes   Currently use? Yes   How often? Stretching after dinner   Who/where? At home alone.    Chiropractic therapy? No   Helpful? No   Currently use? No   How often? N/A   Acupuncture? Yes   Helpful? Yes   Currently use? No   How often? Need insurance to cover.   Meditation? Yes   Helpful? Yes   Currently use? Yes   How often? Nightly, and during high stress levels   Who/where? Dr Clarene Critchley Deborah Chalk   Tai Chi/Qigong? Yes   Helpful? Yes   Currently use? No   How often? [facility] [employer] started in 1998   Who/where? None    Other Integrative Healthcare Providers: I was getting massage therapy but I pay out of pocket.  Not in budget.    Regular dental exams? No   Last visit: March 2019   Dental work: Grinding;TMJ;Gum Disease;Root Canal/s   When? All done from 2004-2012.  April 2019 oral surgery to remove a decayed tool part of a 3pc bridge. Went to OR and intubated and over night in hospital.   Cavities/fillings? Yes   How many? 10   Type Gold or Silver/Mercury Fillings;Porcelain Fillings   Brushing frequency: 2  times a day   Flossing frequency: 3 times a week.  Friction was triggering anaphylaxis    Floss with? Floss;Waterpik   Outside of the [place] Yes   Wilderness camping? Yes   Where? [facility]    Sexual  health/function concerns: No   AVG hrs of sleep per night? 6-8   Avg # of times awaken per night? 1-2   Response to waking up? Depending on my pain level.  I do get deep sleep.  But when pain is bad I have sleep studies that show alpha wave intrusion and hundreds of wake up.  My snoring has stopped on antihistamine    Read before falling asleep? No   Watch TV before falling asleep? Yes   TV in the bedroom? No   Work on computer/laptop/smartphone before falling asleep or during the night? No   Taken medications in past for insomnia? Yes   Type: Ambien,   had adverse reactions to it.    Currently take sleep aides? No   Awake feeling rested? Yes   # of times hit the 'snooze' button: 1-2   First 30 minutes upon awakening: Meditate/Pray;Review to do list for the day   Exercise frequency: 1x/week   Track/monitor activity? Yes   Tracking device: Other   Other: [employer] and sprint daily walk monitor, pulse bp    Leisure activities: Disabled, i try to exercise with out stressing my body out which triggers mast cell degranulation.   Able to walk 3 blocks/swim/use exercise bike? No   Walking: 1x/week   Minutes/Day 15   Stretching (yoga, pilates, bands, etc.) 1x/week   Minutes/Day 3   Strength Training/Weight Lifting I don't do this   Group Fitness (zumba, kickbox, crossfit, etc.) I don't do this   Aerobic (biking, running, swimming, etc.) I don't do this   Physical limitations preventing exercise: Ankle pain;Back pain;Fatigue;Shortness of breath;Other limitations   Other limitations to exercising: Anaphylaxis,  abdominal distension, and stressor to my body triggers me.     Severe problems: Ulcers;Colitis;Gallbladder Disease;Diarrhea;Gastroenteritis (nausea and vomiting)   Bowel Frequency: Several daily   Intestinal Surgery: Gall bladder   Intestinal Scopes? Both   Date: Every 3-5 years   Change in eating habits due to health? Yes   How? No preservatives, no dyes, no chemicals, food allergies, digestive problems    Dietary  Lifestyle: Gluten Free   Special Diet/Nutritional Prog? Yes   Special Diet Type: Chemical free, as gluten free as possible, dye free, preservative free.   Track/monitor calories/food intake? Yes   System (MyFitnessPal, Spark People, etc.) My drs orders and allergic diet.   How often? Daily   # Meals per day: 3   Snack between meals? Yes   Can afford the food you need? No   Adequate facilities for food prep? Yes   Cultrual food preferences No   Any food cravings? No   Foods you avoid? Yes   List: Allergic diet, multipal chemical syndrome    Make own food choices/control food environment? Yes   Food Problems: Dairy;Soy;Wheat;Other   Other: Chemicals, preservatives,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 STRESS SIGNALS, SLEEP TO HELP BRAIN REPAIR,  >>   [x]    HORMONE SUPPORT (ADRENAL, THYROID, INSULIN, GENDER):  SUGAR CAN CAUSE COMMUNICATION IMBALANCES, VITAMIN D IS A HORMONE WE MAKE FROM THE SUN!  >>   [x]    ENERGY REGULATION:  NUTRIENTS SUCH AS VITAMIN B2, B12, IRON AND OXYGEN ARE ESSENTIAL FOR MITOCHONDRIA (YOUR BATTERIES)  >>NEED B12 BUT IT NEEDS TO BE CLEAN!!    INJECTABLE AND/OR IV  >>NOT SO SURE IRON IS INDICATED INTRAVENOUSLY BUT WHEN GUT IS BETTER WE MAY WANT TO RE=TRY ORALLY     [x]    DETOX SUPPORT:  HEALTHY GUT TRANSIT IS THE BEST DETOX! IF THE GUT ISN'T ABLE TO DO IT'S JOB, THE LIVER WILL HAVE TO TAKE UP THE SLACK!  >>WOULD LIKE TO TRIAL ORAL L-GLUTATHIONE FOR DETOX AND TO REDUCE LIVER STRAIN (LOWER GGT)     [x]    TRANSPORT/ STRUCTURE:  GETTING NUTRIENTS THROUGHOUT THE BODY AND HOW WE MOVE THROUGH SPACE ALL  CONTRIBUTE IMMENSELY TO OVERALL HEALTH. ...CONSIDER YOUR SPINE AND JOINTS AS STARS IN A CONSTELLATION. MAKE AS MANY CONSTELLATIONS THROUGH YOUR DAY AS POSSIBLE NOT JUST A STATIC FEW POSITIONS!  >>NEED ACCESS, HOPING DR AHMAD CAN HELP WITH PORT ACCESS AND FLUIDS???     []    DIETICIAN VISIT :  [facility] is no longer employing a dietitian,  Refer to   [facility], RD  [email or web address]  [phone]  Or  Jarrett Ables, RD  [email or web address]  [phone]      []    BEHAVIORAL HEALTH/ COUNSELOR VISIT Arnetha Massy, PhD, many insurances may cover; please check with your carrier) ; Dr Dan Humphreys can see you for 1-5 visits depending on your needs but is not a long-term-needs counselor; her specialty is MBSR (Mindfulness-based stress reduction) and performance improvement.    SEE COURSE SCHEDULE FOR SMALL GROUP MINDFULNESS/ ANXIETY TREATMENT GROUPS  [email or web address]       [x]    REFERRALS OR OTHER ORDERS:    (You may not need an actual referral to access these services)  Integrative services like Massage, Acupuncture  Get costs and schedule at our front desk or by calling [phone]. Special arrangements may be negotiated.    Call [phone] for UC imaging orders or   >>ENT/ DR Vonita Moss FOR HYPERPARATHYROID  >>ENDOCRINE/ DR DIAB      >>DR SYED AHMAD??? FOR SURG/ PORT  ???FLUID COORDINATION ? DR Alpha Gula     [x]    FULLSCRIPT (on-line supplement dispensary)   If an order initiated during your visit-- check your personal email (or spam folder)  Dr Valeda Malm may wait until lab results are seen to complete an order, please let us know if that doesn't happen and you were waiting for it.  If you don't have a Fullscript link, please let Dr Valeda Malm know to set that up for you.    SUPPLEMENT CHANGES OR ADDITIONS:    See above or....  >>L-GLUTATHIONE (PURE ENCAPSULATIONS) OR EQUIVALENT TWICE DAILY  >>D3 + K2  (5000 D3, 100 K2) SEEKING HEALTH OR EQUIVALENT BRAND  SEE IF  NURSE [name] CAN GET THESE FROM [facility]     []    Rx / PRESCRIPTION written for:  >>[facility], WILL FAX   Date:06/27/2017      Patient:  Auna Mikkelsen [address] [place] [address]    Dx: B12 deficiency      [facility] -   Phone: [phone]  Fax: [phone]    [phone] [place], [place] [address]    Methylcobalamin - prepared by the pharmacy  Dosage available- 2mg /ml (2000 mcg/ml)  Without preservative;  Give 0.5 ml (1000 mcg) SQ THREE DAYS A WEEK FOR A MONTH  Qty: 25 ml    Rx: appropriate gauge syringes #12 (1 month supply) RF 3    NURSE PRESENT FOR FIRST INJECTION TO MONITOR FOR ANAPHYLAXIS    Phyllis Ginger, MD  Integrative/Functional Medicine Consultant  [facility], Adjunct Assistant Professor  [facility]  [address]  [place], [place] [address]  [phone], [phone])            [x]  GROUP CLASSES  INCLUDING MINDFULNESS TRAINING  TAI CHI ([facility], [place])    CHECK OUR WEBSITE FOR CLASS OFFERINGS AND COSTS.  [email or web address]      [x]      FOLLOW UP VISIT    Please make appointment to go over any lab work, don't wait for a reminder from [place], especially for nonUC tests that you will not see in MyChart.  For each follow up appointment, we will be asking you to complete one or two electronic questionnaires before your visit so please check your UCMyChart in the days prior to your next visit and complete these important  health status forms.   These help [place] partner with you for your health more efficienctly  Please bring your Integrative Medicine folder, all prior handouts and test reports as well as updated lists of medications and supplements to each visit, since electronic access to your data is not as easy as it should be!      -----------------------------------------  Please review above recommendations and TLC (Therapeutic Lifestyle Changes).   We  advise gradual changes, staggered entry of new components in order to determine pros/cons of various interventions.   It took time to get out of balance and restoring balance may be a long, worthwhile journey!  We look forward to partnering with you for your wellness.      Phyllis Ginger, MD  [facility], Adjunct Associate Professor  Integrative/Functional Medicine Consultant  [facility]  [address]   [place], [place] [address]  [phone])  (MD clinic hours Mon all day, Tues pm, Thurs all day)    Chief Medical Assistant: Ciro Backer, MA  Patient Services Representative: Phoebe Perch staff  Registered Dietician:  As of Dec 2018, we no longer have a dietitian on staff  Integrative Medicine Clinical Director: Ancil Linsey  Director of Integrative Health and Wellness Clinic: Ester Rink, PhD.        ================    Follow Up:  No Follow-up on file.  to review tests as specified in AVS    Phyllis Ginger, MD  Integrative/Functional Medicine Consultant  [facility], Adjunct Associate Professor  [facility]  [address]  [place], [place] [address]  [phone])      ===============================

## 2017-06-27 NOTE — Patient Instructions (Addendum)
INTEGRATIVE MEDICINE CHECK LIST   Please refer to the following outlines to verify the wellness advice and further testing we discussed.      TO DO LIST:  []    LAB ORDERS PLACED:  If at the end of your visit, please use our [place] office, 1st floor;   if later and/or fasting, you may use any UC lab site preferred.  UC drawn labs will be communicated via email.   If you have blood drawn at a non UC site, we will NOT be able to see those results and may only be able to review them with you at a visit.     Look for a results letter in your UC MyChart message basket 5-7 days after your results are completely back.    >>   []   ?? GIMAP STOOL TEST KIT,  (results not visible in UC MyChart)      PLEASE MAKE APPT FOR 3-4 WEEKS ASAP AFTER SENDING IN KIT OR EVEN BEFORE YOU SEND IT IN ; DON'T WAIT FOR [place] TO LET YOU KNOW THE TEST IS BACK, IT WILL NOT SHOW UP IN YOUR MYCHART FILE  >>   []    NEUROLAB [facility]) Neurotransmitter testing along with salivary cortisol/DHEA testing:  We provide the kit, you complete at home and send to [facility], results outside UC, 2-3 weeks turn around time.Marland Kitchen  PLEASE MAKE APPT FOR 3-4 WEEKS ASAP AFTER SENDING IN KIT OR EVEN BEFORE YOU SEND IT IN ; DON'T WAIT FOR [place] TO LET YOU KNOW THE TEST IS BACK, IT WILL NOT SHOW UP IN YOUR MYCHART FILE  >>     []    GENOVA KIT (results not visible in UC MyChart)   MAKE AN APPOINTMENT FOR THE TIMING LISTED (TAT)       >>   [x]    MIND BODY:  MAKE TIME FOR QUIET BREATHING, DISCONNECT FROM PHONE/TEXT/COMPUTER, SPEND TIME IN NATURE, GRATITUDE JOURNAL'  >>MINDFULNESS TRAINING COURSE, CHECK WEBSITE FOR LATEST CLASS SCHEDULE  >>   [x]    GUT HEALTH:  EAT REAL FOOD, NOT TOO MUCH, MOSTLY PLANTS!  >>   [x]    IMMUNE/ ANTI- INFLAMMATION SUPPORT:  TO HELP YOUR BODY DEFEND ITSELF, LIMIT SUGAR TO HELP STRESS SIGNALS, SLEEP TO HELP BRAIN REPAIR,  >>   [x]    HORMONE SUPPORT (ADRENAL, THYROID, INSULIN, GENDER):  SUGAR CAN CAUSE COMMUNICATION IMBALANCES, VITAMIN D IS A HORMONE WE MAKE  FROM THE SUN!  >>   [x]    ENERGY REGULATION:  NUTRIENTS SUCH AS VITAMIN B2, B12, IRON AND OXYGEN ARE ESSENTIAL FOR MITOCHONDRIA (YOUR BATTERIES)  >>NEED B12 BUT IT NEEDS TO BE CLEAN!!    INJECTABLE AND/OR IV  >>NOT SO SURE IRON IS INDICATED INTRAVENOUSLY BUT WHEN GUT IS BETTER WE MAY WANT TO RE=TRY ORALLY     [x]    DETOX SUPPORT:  HEALTHY GUT TRANSIT IS THE BEST DETOX! IF THE GUT ISN'T ABLE TO DO IT'S JOB, THE LIVER WILL HAVE TO TAKE UP THE SLACK!  >>WOULD LIKE TO TRIAL ORAL L-GLUTATHIONE FOR DETOX AND TO REDUCE LIVER STRAIN (LOWER GGT)     [x]    TRANSPORT/ STRUCTURE:  GETTING NUTRIENTS THROUGHOUT THE BODY AND HOW WE MOVE THROUGH SPACE ALL CONTRIBUTE IMMENSELY TO OVERALL HEALTH. ...CONSIDER YOUR SPINE AND JOINTS AS STARS IN A CONSTELLATION. MAKE AS MANY CONSTELLATIONS THROUGH YOUR DAY AS POSSIBLE NOT JUST A STATIC FEW POSITIONS!  >>NEED ACCESS, HOPING DR AHMAD CAN HELP WITH PORT ACCESS AND FLUIDS???     []   DIETICIAN VISIT : [facility] is no longer employing a dietitian,  Refer to   [facility], RD  [email or web address]  [phone]  Or  Jarrett Ables, RD  [email or web address]  [phone]      []    BEHAVIORAL HEALTH/ COUNSELOR VISIT Arnetha Massy, PhD, many insurances may cover; please check with your carrier) ; Dr Dan Humphreys can see you for 1-5 visits depending on your needs but is not a long-term-needs counselor; her specialty is MBSR (Mindfulness-based stress reduction) and performance improvement.    SEE COURSE SCHEDULE FOR SMALL GROUP MINDFULNESS/ ANXIETY TREATMENT GROUPS  [email or web address]       [x]    REFERRALS OR OTHER ORDERS:    (You may not need an actual referral to access these services)  Integrative services like Massage, Acupuncture  Get costs and schedule at our front desk or by calling [phone]. Special arrangements may be negotiated.    Call [phone] for UC imaging orders or   >>ENT/ DR Vonita Moss FOR HYPERPARATHYROID  >>ENDOCRINE/ DR  DIAB      >>DR SYED AHMAD??? FOR SURG/ PORT  ???FLUID COORDINATION ? DR Alpha Gula     [x]    FULLSCRIPT (on-line supplement dispensary)   If an order initiated during your visit-- check your personal email (or spam folder)  Dr Valeda Malm may wait until lab results are seen to complete an order, please let us know if that doesn't happen and you were waiting for it.  If you don't have a Fullscript link, please let Dr Valeda Malm know to set that up for you.    SUPPLEMENT CHANGES OR ADDITIONS:    See above or....  >>L-GLUTATHIONE (PURE ENCAPSULATIONS) OR EQUIVALENT TWICE DAILY  >>D3 + K2  (5000 D3, 100 K2) SEEKING HEALTH OR EQUIVALENT BRAND  SEE IF NURSE [name] CAN GET THESE FROM [facility]     []    Rx / PRESCRIPTION written for:  >>[facility]YL [facility], WILL FAX   Date:06/27/2017      Patient:  Emily Bonilla  [address] [place] [address]    Dx: B12 deficiency      [facility] -   Phone: [phone]  Fax: [phone]    [phone] [place], [place] [address]    Methylcobalamin - prepared by the pharmacy  Dosage available- 2mg /ml (2000 mcg/ml)  Without preservative;  Give 0.5 ml (1000 mcg) SQ THREE DAYS A WEEK FOR A MONTH  Qty: 25 ml    Rx: appropriate gauge syringes #12 (1 month supply) RF 3    NURSE PRESENT FOR FIRST INJECTION TO MONITOR FOR ANAPHYLAXIS    Phyllis Ginger, MD  Integrative/Functional Medicine Consultant  [facility], Adjunct Assistant Professor  [facility]  [address]  [place], [place] [address]  [phone], [phone])            [x]  GROUP CLASSES  INCLUDING MINDFULNESS TRAINING  TAI CHI ([facility], [place])    CHECK OUR WEBSITE FOR CLASS OFFERINGS AND COSTS.  [email or web address]      [x]      FOLLOW UP VISIT    Please make appointment to go over any lab work, don't wait for a reminder from [place], especially for nonUC tests that you will not see in MyChart.  For each  follow up appointment, we will be asking you to complete one or two electronic questionnaires before your visit so please check your UCMyChart in the days prior to your next visit  and complete these important health status forms.   These help [place] partner with you for your health more efficienctly  Please bring your Integrative Medicine folder, all prior handouts and test reports as well as updated lists of medications and supplements to each visit, since electronic access to your data is not as easy as it should be!      -----------------------------------------  Please review above recommendations and TLC (Therapeutic Lifestyle Changes).   We advise gradual changes, staggered entry of new components in order to determine pros/cons of various interventions.   It took time to get out of balance and restoring balance may be a long, worthwhile journey!  We look forward to partnering with you for your wellness.      Phyllis Ginger, MD  [facility], Adjunct Associate Professor  Integrative/Functional Medicine Consultant  [facility]  [address]   [place], [place] [address]  [phone])  (MD clinic hours Mon all day, Tues pm, Thurs all day)    Chief Medical Assistant: Ciro Backer, MA  Patient Services Representative: Phoebe Perch staff  Registered Dietician:  As of Dec 2018, we no longer have a dietitian on staff  Integrative Medicine Clinical Director: Ancil Linsey  Director of Integrative Health and Wellness Clinic: Ester Rink, PhD.

## 2017-06-28 NOTE — Telephone Encounter (Signed)
Calling to inform Dr. Alpha Gula on updates.     Saw Dr. Valeda Malm on 06/27/17. Plan is to have port placed for home iv infusion of lactated ringers. Will also need B12 injections.     Will need Dr. Alpha Gula to write order for LR once port is in.     Future Appointments  Date Time Provider Department Center   07/13/2017 11:00 AM Biagio Borg, Ph.D. [facility] INTE [address] [facility]   08/08/2017 1:30 PM Angelia Mould, MD [facility] ENT GNI UCGNI   08/18/2017 2:45 PM Shafi Lodhi [facility] PSY OP OP   09/05/2017 10:00 AM Stefan Church, CNP [facility] NEUR GNI UCGNI   12/08/2017 9:20 AM Ilona Dubuske V, DO [facility] ALG HOX HOX

## 2017-06-28 NOTE — Unmapped (Signed)
Patient has ref in epic, made appt for 10-18-2017, patient reports symptoms currently would like a sooner appt if possible. Patient can be reached at [phone].

## 2017-06-29 ENCOUNTER — Ambulatory Visit: Admit: 2017-06-29 | Discharge: 2017-06-29 | Payer: PRIVATE HEALTH INSURANCE

## 2017-06-29 ENCOUNTER — Ambulatory Visit: Admit: 2017-06-29 | Discharge: 2017-06-29 | Payer: PRIVATE HEALTH INSURANCE | Attending: Family

## 2017-06-29 DIAGNOSIS — E119 Type 2 diabetes mellitus without complications: Secondary | ICD-10-CM

## 2017-06-29 DIAGNOSIS — D8940 Mast cell activation, unspecified: Secondary | ICD-10-CM

## 2017-06-29 MED ORDER — blood sugar diagnostic Strp
ORAL_STRIP | 2 refills | 30.00000 days | Status: AC
Start: 2017-06-29 — End: 2018-03-01

## 2017-06-29 MED ORDER — lancets Misc
1 refills | Status: AC
Start: 2017-06-29 — End: 2018-04-28

## 2017-06-29 MED ORDER — metFORMIN (GLUCOPHAGE) 500 MG tablet
500 | ORAL_TABLET | Freq: Two times a day (BID) | ORAL | 5 refills | Status: AC
Start: 2017-06-29 — End: 2017-11-18

## 2017-06-29 MED ORDER — levothyroxine (SYNTHROID, LEVOTHROID) 125 MCG tablet
125 | ORAL_TABLET | Freq: Every day | ORAL | 11 refills | Status: AC
Start: 2017-06-29 — End: 2017-08-29

## 2017-06-29 MED ORDER — blood-glucose meter Misc
0 refills | 8.00000 days | Status: AC
Start: 2017-06-29 — End: ?

## 2017-06-29 MED ORDER — cholecalciferol, vitamin D3, (CHOLECALCIFEROL, VIT D3,,BULK,) 100,000 unit/gram Powd
100000 | 2 refills | Status: AC
Start: 2017-06-29 — End: 2017-07-12

## 2017-06-29 NOTE — Telephone Encounter (Signed)
Patient contacted and educated on surgery instructions below. All questions answered to their satisfaction. Copy of instructions will be sent via MyChart to patient. Patient will call with any additional questions or concerns.       Pre-Procedure Instructions    Were pleased that you have chosen [facility] for your upcoming procedure.  The staff serving you is professionally trained to provide the highest quality care.  We encourage you to ask questions and to let the staff know your special needs.  We want your visit to be as comfortable as possible.    Your procedure is scheduled on 07/07/17 at 3:30 PM with Dr. Penni Bombard  Please arrive at 1:30 PM at: [facility] on the second floor of the main hospital      DO NOT EAT OR DRINK ANYTHING (including gum, mints, water, etc.) after midnight the night before your procedure.  You may brush your teeth and gargle on the morning of surgery, but do not swallow any water.  You may take the following medications with a small sip of water:  Pre-op/Anesthesia nurse will be calling you to review your history and medications. Please have a list available for her to review and she will tell you what you can and cannot take the day of surgery.    Please avoid aspirin or ibuprofen containing products for 1 week prior to surgery.  Please notify the surgeon or nurse if you are taking any blood thinners for any reason as there may be special instructions regarding these medications.     Please make transportation arrangements and bring a responsible adult to accompany you home and remain with you for 24 hours.   Leave valuables (money, jewelry, credit cards) at home.  If you wear glasses or contacts, bring a case for safekeeping.   Wear casual, loose fitting, and comfortable clothing.  A gown will be provided.  If you are staying overnight, bring a small overnight bag.  (Storage space is limited.)   Please remove all makeup, jewelry, body piercings,  powder, perfume, and nail polish before you arrive.   Bring a list of your medications and dose including herbal and over-the-counter medications.  Do not bring any pills or medications to the hospital. (Exception: transplant patients.)    Bring a photo ID and your insurance card so we can bill your insurance company directly.   Please do not bring any children under the age of 96 to the hospital.   Do not shave in the area of the surgery for 2 days prior to surgery.  If needed, a trained staff member will clip the area immediately before your surgery.   Quit smoking as far in advance of surgery as possible.  Patients who quit at least 30 days before surgery may have better outcomes.   If you are diabetic, pay close attention to your blood sugar and try to keep it in the range your doctor wants it to be in.  If you have low blood sugar prior to coming in to the hospital you may drink a small amount of CLEAR SUGAR-CONTAINING FLUIDS WITHOUT PULP SUCH AS APPLE JUICE OR CLEAR SODA.  Depending on the procedure you are having this could impact the timing of your surgery.   Discuss discontinuing herbal medications with your doctor before surgery.   Please shower at home the evening before and the morning of surgery using an antiseptic agent, if provided, or antibacterial soap.   If you suspect that  you have an infection prior to surgery, contact your doctor and surgeon prior to surgery.  Also tell the pre-surgery nurse on the day of surgery.    Additional instructions: none    Contact information:    Surgical Oncology Main office # [phone]  Nurse :  Alveta Heimlich RN

## 2017-06-29 NOTE — Telephone Encounter (Signed)
[facility] has called for clarification of the following:     MetFORMIN (GLUCOPHAGE) 500 MG tablet    The pharmacy tech states a called was received from a compound pharmacy inquiring of a topical Metformin. Please advise which form of Metforming the patient is in need of.    Pharmacy: [facility] # [phone]

## 2017-06-29 NOTE — Patient Instructions (Addendum)
I am prescribing Metformin 500 mg twice daily. I would like for you to take Metformin 500 mg once daily for 1 week and if tolerating well increase dose to twice per day at breakfast and dinner     We are increasing your Levothyroxine to 125 mcg PO once daily and I would like to repeat labs in 6-8 weeks after starting new dose     I am prescribing vitamin d either 50000 units weekly or 8000 units daily oral solution (will call pharmacy and discuss best option to help prevent allergic reaction)

## 2017-06-29 NOTE — Progress Notes (Signed)
Chief Complaint   Patient presents with    New Patient Visit/ Consultation    Thyroid Problem     History of Present Illness    Emily Bonilla comes in the office today in consultation regarding her recently noted hyperparathyroidism, with vitamin D deficiency with a PTH of 151 and 25 hydroxy vitamin D of 10.5, with low normal serum calciums, 8.4 to 9.4, over the past year.  She also has longstanding acquired hypothyroidism, with Hashimoto's thyroiditis, with a recent TSH of 6.1 having recently increased her levothyroxine and started vitamin D.  She does have a complicated medical history, including severe allergy and mast cell activation.  She also has a strong family history of thyroid cancer, as well as Hashimoto's thyroiditis.  She has noted weight gain over the past several months.  She reports a prior sleep study showed sleep fragmentation, which improved with management of her underlying autoimmune disease.     Cancer Staging  Cancer Staging  No matching staging information was found for the patient.    Histories  She has a past medical history of Anaphylaxis; Anemia; Anxiety; Asthma; Atrial fibrillation (CMS Dx); Bronchitis; Heart disease; Neurological disease; OSA (obstructive sleep apnea); Osteoporosis; Psychogenic nonepileptic seizure; Seizures (CMS Dx); Spontaneous pneumothorax; Thyroid disease; Ulcer; and WPW (Wolff-Parkinson-White syndrome).    She has a past surgical history that includes Tibia fracture surgery; Tonsillectomy; Liver biopsy; Cardiac electrophysiology mapping and ablation; Facial cosmetic surgery; Bone marrow biopsy; removal hardware leg (Left, 08/17/2016); dental extraction (Right, 04/25/2017); Cholecystectomy (2015); and Hysterectomy (2009).    Her family history includes Aneurysm in her mother; Kidney failure in her mother.    She reports that she has never smoked. She has never used smokeless tobacco. She reports that she does not drink alcohol or use drugs.    Allergies  Acetaminophen;  Aspirin; Blue dye; Epinephrine; Flecainide; Gabapentin; Iodinated contrast- oral and iv dye; Isopropyl alcohol; Latex; Lidocaine; Nsaids (non-steroidal anti-inflammatory drug); Onion; Other; Perfume; Pregabalin; Quetiapine; Red dye; Sulfur; Yellow dye; Amitriptyline; Doxepin; Duloxetine; Epi e-z pen; Hydroxyzine; Levofloxacin; Montelukast; Verapamil (bulk); Adhesive tape-silicones; and Trazodone    Medications  Outpatient Encounter Prescriptions as of 06/29/2017   Medication Sig Dispense Refill    blood sugar diagnostic Strp Use 1 test strip twice daily to check blood glucose 100 strip 2    blood-glucose meter Misc Use meter to test blood glucose twice daily 1 each 0    busPIRone (BUSPAR) 10 MG tablet Take 2 tablets (20 mg total) by mouth 3 times a day. Indications: Generalized Anxiety Disorder 180 tablet 2    cetirizine (ZYRTEC) 10 mg Cap       cholecalciferol, vitamin D3, (CHOLECALCIFEROL, VIT D3,,BULK,) 100,000 unit/gram Powd Take 16109 units by mouth once weekly 2 g 2    diphenhydrAMINE (BENADRYL) 25 mg capsule Take 75-150 mg by mouth daily.      doxepin (SINEQUAN) 10 MG capsule Take 1 capsule (10 mg total) by mouth at bedtime. 90 capsule 3    fexofenadine (ALLEGRA) 180 MG tablet Take 1 tablet (180 mg total) by mouth daily. 30 tablet 3    furosemide (LASIX) 20 MG tablet Take 1 tablet (20 mg total) by mouth daily. 90 tablet 3    hydrOXYzine HCl (ATARAX) 25 MG tablet Take 1 tablet (25 mg total) by mouth 3 times a day as needed for Anxiety. Indications: anxiety 90 tablet 2    lancets Misc Use on lancet twice daily to check blood glucose 200 each 1    levalbuterol (  XOPENEX) 1.25 mg/3 mL nebulizer solution Inhale 3 mL (1.25 mg total) by nebulization every 6 hours as needed for Wheezing. 72 mL 0    levothyroxine (SYNTHROID, LEVOTHROID) 125 MCG tablet Take 1 tablet (125 mcg total) by mouth daily. 30 tablet 11    metFORMIN (GLUCOPHAGE) 500 MG tablet Take 1 tablet (500 mg total) by mouth 2 times a day with  meals. 60 tablet 5    montelukast (SINGULAIR) 10 mg tablet TAKE ONE TABLET BY MOUTH AT BEDTIME. 30 tablet 0    nebulizer and compressor Devi Use 1 ampule as directed every 4 hours. Indications: Please dispense 1 nebulizer unit 1 each 0    omeprazole (PRILOSEC) 40 MG capsule Take 40 mg by mouth.      onabotulinumtoxinA (BOTOX COSMETIC) 50 unit SolR Inject into the muscle.      predniSONE (DELTASONE) 20 MG tablet TAKE ONE TABLET BY MOUTH DAILY. 30 tablet 0    ranitidine (ZANTAC) 300 MG tablet Take 1 tablet (300 mg total) by mouth 3 times a day. 90 tablet 11    triamcinolone (KENALOG) 0.1 % ointment APPLY 2 TIMES DAILY 80 g 11    [DISCONTINUED] levothyroxine (SYNTHROID, LEVOTHROID) 112 MCG tablet Take 1 tablet (112 mcg total) by mouth daily. 90 tablet 3     No facility-administered encounter medications on file as of 06/29/2017.         The following portions of the patient's history were reviewed and updated as appropriate: allergies, current medications, past family history, past medical history, past social history, past surgical history, review of systemsand problem list.    Review of Systems    Comprehensive review of systems was reviewed andscanned, with pertinent information included in HPI.        Vitals  Blood pressure 131/80, pulse 83, height 5' 7 (1.702 m), weight (!) 222 lb 4.8 oz (100.8 kg).  Physical Exam  Constitutional:      General: Well developed and nourished, appears stated age, no distress      Communication:  Normal voice  Head and Face:      Inspection: No masses      Palpation: Non-tender      Salivary glands:  Normal, no mass, non-tender      Facial strength: Normal, House grade I/VI bilaterally  Eyes:      Intact ocular motility with midline gaze  Ears:      Normal external auditory canals and tympanic membrane mobility bilaterally      Normal hearing to soft voice/finger rub      External ear/auricle without lesion bilaterally  Nose:      External nose without lesion      Nasal  septum midline, intact inferior turbinate bilaterally  Oral cavity:      No lip or gum lesion  Oropharynx:      No lesion of oral mucosa, palate, or tonsil fossa      No lesion of pharyngeal walls/hypopharynx  Nasopharynx:      No mucosal lesion  Larynx:      Normal vocal cord motion, no lesions  Neck:       No mass  Thyroid:      No mass  Respiratory:      No retractions or accessory breathing  Cardiovascular:      Extremities warm and non swollen  Lymphatic:      No cervical lymphadenopathy  Neurologic:      Cranial nerves intact    Lab Review:  Lab Results   Component Value Date    TSH 6.11 (H) 06/09/2017    T3TOTAL 98.0 06/09/2017    FREET4 0.81 06/09/2017    THYROIDAB 747.0 (H) 06/09/2017      Lab Results   Component Value Date    PTH 151.0 (H) 06/09/2017    CALCIUM 9.4 05/31/2017     Lab Results   Component Value Date    VITD25H 10.5 (L) 06/09/2017      Lab Results   Component Value Date    WBC 12.6 (H) 05/31/2017    HGB 14.8 05/31/2017    HCT 45.2 (H) 05/31/2017    MCV 84.9 05/31/2017    PLT 317 05/31/2017    CREATININE 0.84 05/31/2017    BUN 10 05/31/2017    NA 141 05/31/2017    K 4.3 05/31/2017    CL 102 05/31/2017    CO2 27 05/31/2017    ALT 14 10/28/2016    AST 16 10/28/2016    GGT 53 06/09/2017    ALKPHOS 110 10/28/2016    BILITOT 0.3 10/28/2016     1. Weight gain. I recommended attempts at weight loss, with a BMI target ideal 25, more realistically less than 30.     2. Acquired hypothyroidism under replaced.  Recommend titration of her levothyroxine to keep her TSH in the normal range, ideally between 1 and 3.     3. Strong family history of thyroid cancer and Hashimoto's thyroiditis.  Ultrasound today revealed a heterogenous thyroid, without dominant or suspicious nodule.   After a discussion of the risks and benefits, including alternative treatment options, such as ultrasound guided fine needle biopsy or thyroid surgery, the patient elected to proceed with observation and sonographic surveillance.      4, 5. Hyperparathyroidism, likely secondary to vitamin D deficiency.  I recommended vitamin D supplementation with a 25 hydroxy vitamin D target greater than 30.

## 2017-06-29 NOTE — Unmapped (Signed)
Subjective:   Emily Bonilla presents for the evaluation and management of osteopenia which was diagnosed when patient had BMD testing that revealed osteopenia in 2010 had 2 fractures in leg two years ago after missing two steps and causing her to fall. ??  She is not currently being treated with calcium and vitamin D supplementation. She is not currently being treated with any pharmacological medications for osteoporosis.   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 (over the last three months has been on 3 rounds of steroids )  Fractures: Tib/Fib fracture 2 years ago from missing a step while ambulating down a flight of stairs   Smoker:  none  Alcohol:  none  Significant weight gain/loss: 57 lb weight gain since 2018; weighed 165 lb in 08/2016 and now 222 lb  Height loss: none  Hx of kidney stones: no  PMH of hypercalcemia: none  PMH of thyroid abnormalities:  Hashimoto's thyroiditis, hypothyroidism currently taking Levothyroxine 112 mcg PO once daily   Familial history of hypercalcemia:  none  Menopause:  Hysterectomy 20017 age 57 years of age   Hx of HRT none   Calcium in diet + suppl:  No supplement and does not get adequate calcium diet daily   Vitamin D:  Not currently taking, last vitamin d level 10.2   Dental health:  Every 6 months   Plan for dental procedures none planned   Family history of Osteoporosis:  Mother and maternal aunt    Hx of hip fracture none  Radiation to bone: none  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 Was diagnosed with prediabetes 2 years ago have been monitoring and working on diet and exercise to  prevent further progression to type II DM, had rapid weight gain over the last 6-8 months and now has A1C of 7.3%     Hypothyroidism/+ hashimoto's thyroiditis, Family hx of thyroid cancer mother, nephew, niece, and maternal aunt.   Symptoms today:  Fatigue, + compression symptoms of dysphagia, smothering sensation when laying flat, choking sensation and waking during middle of the night feeling like airway is compromised.   + TPO antibodies 05/2017, elevated TSH on LT4 112 mcg PO once daily (will increase dose to 125 mcg PO once daily and have repeat labs in 6-8 weeks)  Has appointment with Dr Angelia Mould, MD at 930 this morning.     Allergies   Allergen Reactions   ??? Acetaminophen Anaphylaxis     Unknown   ??? Aspirin Anaphylaxis     Unknown   ??? Blue Dye Anaphylaxis     Unknown   ??? Epinephrine Anaphylaxis   ??? Flecainide Shortness Of Breath     Itching, decreased libido, wheezing, flushing, numbness.    ??? Gabapentin Anaphylaxis   ??? Iodinated Contrast- Oral And Iv Dye Anaphylaxis   ??? Isopropyl Alcohol Anaphylaxis   ??? Latex Anaphylaxis and Rash   ??? Lidocaine Anaphylaxis   ??? Nsaids (Non-Steroidal Anti-Inflammatory Drug) Anaphylaxis   ??? Onion Anaphylaxis   ??? Other Anaphylaxis  HAS MAST CELL ACTIVATION SYNDROME, SO PATIENT HAS ANAPHYLACTIC REACTIONS IF BODY RECOGNIZES TRIGGERS  CANNOT TOLERATE ANY DYES, PRESERVATIVES, PARABENS, ETC.  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   ??? Perfume Anaphylaxis   ??? Pregabalin Anaphylaxis   ??? Quetiapine Anaphylaxis     Mental Status Change   ??? Red Dye Anaphylaxis     Unknown   ??? Sulfur Anaphylaxis     Pt states: Any alcohol product   ??? Yellow Dye Anaphylaxis     Unknown   ??? Amitriptyline    ??? Doxepin Other (See Comments)     Unknown   ??? Duloxetine      Other reaction(s): Other (See Comments)  Mental Status Change   ??? Epi E-Z Pen    ??? Hydroxyzine Itching     Unknown    ??? Levofloxacin Itching   ??? Montelukast    ??? Verapamil (Bulk) Hives     Insomnia, flushing, scattered brain, migraine, itching, anxiety.    ??? Adhesive Tape-Silicones Itching, Rash and Swelling     Unknown   ??? Trazodone Rash     Current Outpatient Prescriptions   Medication Sig Dispense Refill   ??? busPIRone (BUSPAR) 10 MG tablet Take 2 tablets (20 mg total) by mouth 3 times a day. Indications: Generalized Anxiety Disorder 180 tablet 2   ??? diphenhydrAMINE (BENADRYL) 25 mg capsule Take 75-150 mg by mouth daily.     ??? doxepin (SINEQUAN) 10 MG capsule Take 1 capsule (10 mg total) by mouth at bedtime. 90 capsule 3   ??? fexofenadine (ALLEGRA) 180 MG tablet Take 1 tablet (180 mg total) by mouth daily. 30 tablet 3   ??? furosemide (LASIX) 20 MG tablet Take 1 tablet (20 mg total) by mouth daily. 90 tablet 3   ??? hydrOXYzine HCl (ATARAX) 25 MG tablet Take 1 tablet (25 mg total) by mouth 3 times a day as needed for Anxiety. Indications: anxiety 90 tablet 2   ??? levalbuterol (XOPENEX) 1.25 mg/3 mL nebulizer solution Inhale 3 mL (1.25 mg total) by nebulization every 6 hours as needed for Wheezing. 72 mL 0   ??? levothyroxine (SYNTHROID, LEVOTHROID) 112 MCG tablet Take 1 tablet (112 mcg total) by mouth daily. 90 tablet 3   ??? montelukast (SINGULAIR) 10 mg tablet TAKE ONE TABLET BY MOUTH AT BEDTIME. 30 tablet 0   ??? nebulizer and compressor Devi Use 1 ampule as directed every 4 hours. Indications: Please dispense 1 nebulizer unit 1 each 0   ??? omeprazole (PRILOSEC) 40 MG capsule Take 40 mg by mouth.     ??? onabotulinumtoxinA (BOTOX COSMETIC) 50 unit SolR Inject into the muscle.     ??? predniSONE (DELTASONE) 20 MG tablet TAKE ONE TABLET BY MOUTH DAILY. 30 tablet 0   ??? ranitidine (ZANTAC) 300 MG tablet Take 1 tablet (300 mg total) by mouth 3 times a day. 90 tablet 11   ??? triamcinolone (KENALOG) 0.1 % ointment APPLY 2 TIMES DAILY 80 g 11     No current facility-administered medications for this visit.      Family History   Problem Relation Age  of Onset   ??? Kidney failure Mother    ??? Aneurysm Mother      Past Surgical History:   Procedure Laterality Date   ??? BONE MARROW BIOPSY      benign   ??? CARDIAC ELECTROPHYSIOLOGY MAPPING AND ABLATION  attempted x 2, states not successful either time   ??? CHOLECYSTECTOMY  2015   ??? DENTAL EXTRACTION Right 04/25/2017    Procedure: EXTRACTION TOOTH # 31, BRIDGE RESECTIONING;  Surgeon: Gladys Damme, DMD;  Location: [facility] OR;  Service: Oral Surgery;  Laterality: Right;   ??? FACIAL COSMETIC SURGERY     ??? HYSTERECTOMY  2009   ??? LIVER BIOPSY      benign hepatic adednoma per patient   ??? REMOVAL HARDWARE LEG Left 08/17/2016    Procedure: REMOVAL OF HARDWARE LEFT TIBIA;  Surgeon: Andee Lineman, MD;  Location: [facility] OR;  Service: Orthopedics;  Laterality: Left;   ??? TIBIA FRACTURE SURGERY     ??? TONSILLECTOMY      + adenoids     Patient Active Problem List    Diagnosis Date Noted   ??? CVID (common variable immunodeficiency) (CMS Dx) 06/06/2017   ??? Caries 04/08/2017   ??? Intractable seizures (CMS Dx) 10/28/2016   ??? Spells of decreased attentiveness    ??? Seizure (CMS Dx)    ??? Hypothyroidism due to Hashimoto's thyroiditis 08/05/2016   ??? History of asthma    ??? Painful orthopaedic hardware (CMS Dx) 06/24/2016   ??? Anxiety and depression 03/31/2016   ??? 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 Multiple allergies 03/30/2016   ??? Syncope and collapse 03/30/2016     Social History     Social History   ??? Marital status: Divorced     Spouse name: N/A   ??? Number of children: N/A   ??? Years of education: N/A     Occupational History   ??? Not on file.     Social History Main Topics   ??? Smoking status: Never Smoker   ??? Smokeless tobacco: Never Used      Comment: Denies (11/15/2016)   ??? Alcohol use No      Comment: Denies (11/15/2016)   ??? Drug use: No      Comment: Denies (11/15/2016)   ??? Sexual activity: Not Currently     Other Topics Concern   ??? Caffeine Use Yes   ??? Exercise No   ??? Seat Belt Yes     Social History  Narrative   ??? No narrative on file       Review of Systems  ROS worksheet reviewed in detail with patient, see scanned paperwork. Patient encouraged to discuss non-Endocrine issues with PCP.    The following portions of the patient's history were reviewed and updated as appropriate: allergies, current medications, past family history, past medical history, past social history, past surgical history and problem list.    Objective:  LMP  (LMP Unknown)      Physical Exam  Constitutional:Pt  is oriented to person, place, and time Patient appears well-developed and well-nourished.   Eyes: Conjunctivae and EOM are normal. No exophthalmos. No lid lag or lid retraction. No periorbital swelling.  Neck: Normal range of motion. Neck supple. No tracheal deviation present. + mild thyromegaly present left greater than right.  Carotid bruit not present. Nontender on palpation  Cardiovascular: Normal rate, regular rhythm and normal heart sounds. No gallop or friction rub, no murmur heard.  Pulmonary/Chest: Effort normal and breath sounds normal. No respiratory distress. No wheezes or  rales.   Neurological: Alert and oriented to person, place, and time. Normal reflexes. No tremor of outstretched hands.  Skin: Skin is warm and dry. No erythema.   Psychiatric: Normal  mood and affect. Behavior is normal. Judgment and thought content normal.     Labs reviewed in [facility] and Care everywhere     Imaging Review  No Dexa for review, advised patient to schedule appointment.       Assessment/Plan:  Osteopenia-history of osteopenia, risk factors include: long term steroid use, type II DM, hysterectomy, age, postmenopausal, family history, long term PPI use, vitamin D def, hypothyroidism  -advised patient to have Dexa completed prior to next office visit  -found to have low vitamin d with high PTH, will correct vitamin d with high dose vitamin d therapy. Once vitamin d is corrected will have patient due blood work and 24 hour urine calcium,  sodium, creatinine, and cortisol  -advised on getting 1200 mg of calcium via dietary or supplement   -advised on fall prevention  -advised on exercise to improve bone health     Vitamin D deficiency  -low vitamin d 05/2017  -start high dose vitamin d 47829 units weekly, RX sent to compound pharmacy to provide dye free capsule to help prevent severe allergic reaction   -will repeat vitamin d in 6 weeks to see if levels are improving or if higher dosing is necessary     Hypothyroidism/Hashimoto's thyroiditis  -TSH elevated 05/2017 on current dose LT4 112 mcg PO once daily   -will increase LT4 to 125 mcg PO once daily and repeat labs in 6 weeks   -complaint of compressive symptoms and has appointment with Dr Angelia Mould at 0930 this morning after office visit to discuss compressive concerns   -+family history of thyroid CA    Weight gain  -patient has had 57 lb weight gain since 08/2016.  -has had intermittent steroid doses due to mast cell activation syndrome  -has now progressed to type II DM  -will check 24 hour cortisol when 24 hour urine calcium is done  -will have patient schedule with one of the endocrinologist for further work-up as indicated (already scheduled with Dr Diab's fellow 10/18/2017)    Type II DM   -prediabetes for the last several years and most recent blood work showing elevated A1C 7.3% not currently taking any medications for diabetes  -advised to work on weight reduction, diet and activity to improve glycemic control  -glucose meter sent to pharmacy advised to check blood glucose intermittently several times per week and write on log, bring log back to next office visit   -Start Metformin 500 mg PO once daily x's 1 week and increase to BID if tolerating well. Advised patient to start medication when another person is present in case of allergic reaction. Patient has home health visit 4 hours per day and has alert system button she can press if she develops anaphylaxis from medication. Spoke with  pharmacist at [facility] pharmacy about other medication options, topical metformin is available but given patient's mast cell activation syndrome would like to try oral route verses topical.     A total of 55 minutes face to face visit time with >50% of which were spent counseling the patient about the pathophysiology, evaluation, and treatment of her medical diagnoses.    ??  Plan of care was discussed in detail with the patient who verbalized understanding and agreed.  Patient was given an opportunity to ask questions which were answered appropriately.  ??  The following items were considered in medical decision making:  Permanent chart problem/surgery list reviewed  Permanent chart chronic med/allergy list reviewed  Permanent chart social/family history  reviewed  PFSH and Health screening (pt reported) reviewed  Review/order clinical lab test/results  Review/order radiology test/results  Review/order other diagnostic tests/interventions   Risk and benefits and treatment options discussed with patient.    Katha Cabal, MSN, CNP, FNP-BC    RTC 3 months     Will have patient schedule with one of the endocrinologist for further work-up as indicated (already scheduled with Dr Shawn Route fellow 10/18/2017)

## 2017-06-29 NOTE — Unmapped (Signed)
NECK ULTRASOUND    Pre-op Diagnosis:  Hash/hypothyroidism, hyperparathyroidism with D def, fhx thyroid cancer  Anesthesia:  None  Complications:  none  Estimated Blood Loss:  none  Indications:  sizing-surveillance, preop localization  Procedure:  neck US    The patient was placed in a semi-recumbent position with mild neck extension.  Real time B-mode ultrasound on the neck was performed in transverse/ axial and longitudinal/ sagittal planes.    Heterogenous hypoechoic thyroid c/w thyroiditis without suspicious features or nodes.    No enlarged parathyroid glands noted.    The patient tolerated the procedure well and without side effects.

## 2017-06-29 NOTE — Unmapped (Signed)
Dictated

## 2017-06-30 NOTE — Telephone Encounter (Addendum)
Pt calling to speak with provider about the new Dx for pt. She will be having port placed on thursday at 1530. Pt would like to hear from provider asap. Dr Deloria Lair is the doctor she has been seeing, ent and endocrine as well.  Please call pt.  [phone]. Needs pre-op orders written per pt.

## 2017-06-30 NOTE — Telephone Encounter (Signed)
Please inform patient our office unfortunately cannot manage port-placement/orders or home IV fluid orders.   Our office cannot write for pre-op orders.     Thank you

## 2017-07-01 NOTE — Telephone Encounter (Signed)
FYI only - [facility] faxing PA request for Vitamin D powder to [phone].

## 2017-07-01 NOTE — Telephone Encounter (Signed)
Called patient and informed her that I would have the nurse scheduling her procedure reach out to her next week regarding rescheduling her port placement. She states she does not have any physician identified at this time who will be managing her port and admitting her for care after port placement. She states she is attempting to arrange this with her immunologist but has not received a call back about getting her in earlier to discuss this. She needs to cancel placement at this time. She states she will have the physician who will be managing her port place a new referral once this is arranged and she will contact us for scheduling.       Surgery notified to cancel procedure.

## 2017-07-01 NOTE — Telephone Encounter (Signed)
Received a voicemail from Ms Emily Bonilla today requesting to cancel her [facility] Placement and to get it rescheduled for another day.

## 2017-07-05 NOTE — Telephone Encounter (Signed)
Noted. Message rec'd. No earlier appointment available.

## 2017-07-05 NOTE — Telephone Encounter (Signed)
Spoke with pharmacy and clarified Metformin.

## 2017-07-06 NOTE — Telephone Encounter (Signed)
Spoke to patient and states that she is on day 6 of the Metformin medication. States that she is having some allergic reactions to the medication. States that she have hives on her lower left leg, bottom lip swelling and overall fatigue. States that she have been having the side effects for 6 days. States that she was not having these reactions prior to starting the medication. States that she have no fever, chills, diarrhea or digestive issues. States that she would like to continue taking the medication and states that the Physician can let her know if she should stop taking the Metformin medication.

## 2017-07-07 MED ORDER — hydrOXYzine HCl (ATARAX) 25 MG tablet
25 | ORAL_TABLET | ORAL | 1 refills | Status: AC
Start: 2017-07-07 — End: 2017-09-29

## 2017-07-07 NOTE — Telephone Encounter (Signed)
Spoke with patient, she states that she has been taking her antihistamines and steroids. She states she will take an extra Zyrtec every evening and see if that helps with her symptoms.

## 2017-07-07 NOTE — Telephone Encounter (Signed)
I received the email attached below from Ms. Emily Bonilla sent to my UC email on 07/04/2017 at 11:22PM.    She is requesting that the Allergy Clinic manages her IV hydration therapy suggested by Dr. Valeda Bonilla. I have communicated with the patient and Dr. Valeda Bonilla and advised them both that managing outpatient IV fluids is out of scope of the Allergy Clinic. And thus, she will need to find a different provider to assist her with this.    As a side note, I am concerned that she is mentioning diarrhea and diabetes as the primary causes of her dehydration issues. Perhaps she needs to get her diarrhea worked up by GI, as this could be a contributing cause of her dehydration and thus needs to be addressed. I also advised her to continue following up with Endocrinology for the management of her diabetes.    Emily Bonilla, D.O.  Allergy and Immunology Fellow  Pager: [phone]    ----    From: [email or web address]>  Sent: Monday, July 04, 2017 11:22 PM  To: Emily Bonilla ([email or web address])  Subject: Emily Bonilla    Good Evening,    I was hoping to get you up to speed and hoping you could help with my hydration issue.    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    Emily Bonilla seen a Endocrinologist and she started me on Metformin, Vit D and labs for August. I've started B12 injections 3 times a week for month. B12 to be adjusted after levels rechecked.    Mydehydration is becoming a bigger problem as the diarrhea, diabetes is cycling. My BP drops with multiple reactions.     Iwas on IV fluids 3 times a week for over 4 years with positive hydration results. One and half years ago after my face surgery/infection my port was removed for precautionary measures and a pic line was put in. The pic was taken out when I was  transfered to the rehab center for PT post fracture of both legs.'ve been so dehydrated, urine dark amber, mucus membrain dry, frequent thirst, frequent urination, having more mast cell reactions with BP changes with activity. Home health aid assisted me 2 times in the last 2 weeks with severe flushing, diaphretic, almost passing out after trying to work on my flower garden. I worked less that 10 min before lightheaded, flushing and diaphretic symptoms stopped me in my tracks.    Can you be of assistance with Dr Emily Bonilla for hydration orders.Emily Bonilla been great help with pulling the Vitamin deficiency issues and diabetes diagnosis to the forefront. Hoping the hyperparathyroidism settles once the Vit D gets back to normal.    Dr Emily Bonilla is with Functional medicine and her number is in the chart.    Emily Bonilla

## 2017-07-08 MED ORDER — montelukast (SINGULAIR) 10 mg tablet
10 | ORAL_TABLET | ORAL | 0 refills | Status: AC
Start: 2017-07-08 — End: 2017-09-06

## 2017-07-11 NOTE — Telephone Encounter (Signed)
Orlie Pollen from [employer] has not approved the compounded Vitamin D yet.  Any questions please phone [phone].

## 2017-07-12 MED ORDER — cholecalciferol, vitamin D3, (D3-50 CHOLECALCIFEROL) 50,000 unit capsule
1250 | ORAL_CAPSULE | ORAL | 0 refills | Status: AC
Start: 2017-07-12 — End: 2017-11-10

## 2017-07-13 ENCOUNTER — Ambulatory Visit: Admit: 2017-07-13 | Discharge: 2017-07-13 | Payer: PRIVATE HEALTH INSURANCE

## 2017-07-13 DIAGNOSIS — F439 Reaction to severe stress, unspecified: Secondary | ICD-10-CM

## 2017-07-13 NOTE — Unmapped (Signed)
In: 11am  Out: ??1155am  ??  Chief Issue: stress/anxiety about health??  ??  Subjective:   Pt discussed additional symptoms that she has from her disordered health. ??discussed appointment with Dr. Valeda Malm and the results that she discovered. Multiple deficits, which can be changed over time, which gave her hope.  Aid continuing to work out well.  Still trying to work things through with dietittian - will do this appointment over the telephone.  Feeling overwhelmed a lot of times, yet mostly optimistic and accepting; always holding out some hope about feeling better. Practiced a meditation exercise within session and was able to relax and clear her mind well.  Practiced diaphragmatic breathing on software.  Was able to connect and make a change with breath and HRV.  Will continue to practice this on own outside of session.   Stated that felt her stomach swell as well as lip while in office.  She is assuming that there must have been an irritant that she was exposed to within the last couple of days.       Objective:   Very optimistic and positive on the outside especially with what she is managing health and financially.  MSE  Motor Behavior:??no psychomotor abnormalities  ??  Cognition: alert  ??  Attitude: cooperative  ??  Affect: sad, tearful, appropriate to thought and anxious  ??  Appearance: appropriately dressed  ??  Speech: normal rate  ??  Mood:??depressed, consistent and anxious  ??  Thought Processes:??well organized and no looseness of association??  ??  Perceptions:??no hallucinations or other abnormalities????  ??  Thought content:??no delusions, low self-esteem and demoralized  ??  Insight/ judgement:??no impairments  ??  Suicidal ideation:??none  ??  Homicidal ideation:??none  ??  Symptoms observed during session:  Agitation,??anhedonia,??anxiety/fear,????decreased energy/fatigue, mildly??depressed,??emotional lability, sleep disturbance, restlessness,??sad/pained/worried expression,Open coachable motivated  ??  Assessment:    ??  Intervention strategies implemented and session focus was cognitive behavioral,??insight-oriented,??behavior modification,??supportive,??mindfulness strategies  ??  Plan:   ??  Homework:   Practice breathing techniques  Continuing physical exercise  Follow bedtime routine with consistency  Continue to seek out groups that she can potentially get involved in    ??  Future treatment/follow-up issues:  ??two weeks.

## 2017-07-14 NOTE — Telephone Encounter (Addendum)
Phoned Ms Carducci with message from Marchelle Folks and Ms Karitotis voiced understanding and states that her pharmacy called her late yesterday to let her know that the Rx was ready to be picked up.  Ms Armentor picked up Rx and took first dose last night.

## 2017-07-21 LAB — DIABETIC RETINOPATHY EXAM
Cataracts: POSITIVE
Diabetic Retinopathy: NEGATIVE
Glaucoma: NEGATIVE
Ocular Blood Flow Measure: 13
Ocular Blood Flow Measure: 15

## 2017-08-01 ENCOUNTER — Ambulatory Visit: Admit: 2017-08-01 | Payer: PRIVATE HEALTH INSURANCE

## 2017-08-01 DIAGNOSIS — D8940 Mast cell activation, unspecified: Secondary | ICD-10-CM

## 2017-08-01 MED ORDER — UNABLE TO FIND
11 refills | Status: AC
Start: 2017-08-01 — End: 2019-10-12

## 2017-08-01 NOTE — Unmapped (Addendum)
INTEGRATIVE MEDICINE CHECK LIST   Please refer to the following outlines to verify the wellness advice and further testing we discussed.      TO DO LIST:  []       []    LAB ORDERS PLACED:  If at the end of your visit, please use our [place] office, 1st floor;   if later and/or fasting, you may use any UC lab site preferred.  UC drawn labs will be communicated via email.   If you have blood drawn at a non UC site, we will NOT be able to see those results and may only be able to review them with you at a visit.     Look for a results letter in your UC MyChart message basket 5-7 days after your results are completely back.  >>WILL WANT TO SEE A VITAMIN D LEVEL IN 3 MONTHS OR SO ALONG WITH PTH.  LET ME KNOW WHEN OTHERS ARE GETTING LABS TO SEE IF I CAN PIGGY BACK ON THEIR DRAW.     DON'T MIND NOT RETESTING THE B12 LEVEL BUT WOULD LIKE TO SEE THE MMA (Methylmalonic acid) AGAIN   []    GIMAP STOOL TEST KIT,  (results not visible in UC MyChart)      PLEASE MAKE APPT FOR 3-4 WEEKS ASAP AFTER SENDING IN KIT OR EVEN BEFORE YOU SEND IT IN ; DON'T WAIT FOR [place] TO LET YOU KNOW THE TEST IS BACK, IT WILL NOT SHOW UP IN YOUR MYCHART FILE  >>   []    NEUROLAB [facility]) Neurotransmitter testing along with salivary cortisol/DHEA testing:  We provide the kit, you complete at home and send to [facility], results outside UC, 2-3 weeks turn around time.Marland Kitchen  PLEASE MAKE APPT FOR 3-4 WEEKS ASAP AFTER SENDING IN KIT OR EVEN BEFORE YOU SEND IT IN ; DON'T WAIT FOR [place] TO LET YOU KNOW THE TEST IS BACK, IT WILL NOT SHOW UP IN YOUR MYCHART FILE  >>     []    GENOVA KIT (results not visible in UC MyChart)   MAKE AN APPOINTMENT FOR THE TIMING LISTED (TAT)       >>   [x]    MIND BODY:  MAKE TIME FOR QUIET BREATHING, DISCONNECT FROM PHONE/TEXT/COMPUTER, SPEND TIME IN NATURE, GRATITUDE JOURNAL'  >>  >> 8 week MINDFULNESS TRAINING COURSES, CHECK WEBSITE FOR LATEST CLASS SCHEDULE      [x]    GUT HEALTH:  EAT REAL FOOD, NOT TOO MUCH, MOSTLY PLANTS!  >>FOR IMMUNE  HEALTH, LET'S TRY ADDING SBI PROTECT IMMUNOGLOBULIN FOR SEVERAL MONTHS   [x]    IMMUNE/ ANTI- INFLAMMATION SUPPORT:  TO HELP YOUR BODY DEFEND ITSELF, LIMIT SUGAR TO HELP STRESS SIGNALS, SLEEP TO HELP BRAIN REPAIR,  >>RETRIAL VITAMIN D (D3 + K2) AS TOLERATED, ONCE A WEEK  >>SBI PROTECT (IMMUNOGLOBULIN) 1 TO 4 CAPS DAILY WORK UP SLOWLY.      [x]    HORMONE SUPPORT (ADRENAL, THYROID, INSULIN, GENDER):  SUGAR CAN CAUSE COMMUNICATION IMBALANCES, VITAMIN D IS A HORMONE WE MAKE FROM THE SUN!  >>GLAD ABOUT THE HIGHER DOSE OF THYROID T4,  VITAMIN D IS ALSO A HORMONE.!   [x]    ENERGY REGULATION:  NUTRIENTS SUCH AS VITAMIN B2, B12, IRON AND OXYGEN ARE ESSENTIAL FOR MITOCHONDRIA (YOUR BATTERIES)  >>LET'S INCREASE B12 TO EVERY OTHER DAY INJECTIONS.      [x]    DETOX SUPPORT:  HEALTHY GUT TRANSIT IS THE BEST DETOX! IF THE GUT ISN'T ABLE TO DO IT'S JOB, THE LIVER WILL HAVE TO TAKE  UP THE SLACK!  >>     [x]    TRANSPORT/ STRUCTURE:  GETTING NUTRIENTS THROUGHOUT THE BODY AND HOW WE MOVE THROUGH SPACE ALL CONTRIBUTE IMMENSELY TO OVERALL HEALTH. ...CONSIDER YOUR SPINE AND JOINTS AS STARS IN A CONSTELLATION. MAKE AS MANY CONSTELLATIONS THROUGH YOUR DAY AS POSSIBLE NOT JUST A STATIC FEW POSITIONS!  >>     []    DIETICIAN VISIT : [facility] is no longer employing a dietitian,  Refer to   [facility], RD  [email or web address]  [phone]  Or  Jarrett Ables, RD  [email or web address] [email or web address]  [phone]      []    BEHAVIORAL HEALTH/ COUNSELOR VISIT Arnetha Massy, PhD, many insurances may cover; please check with your carrier) ; Dr Dan Humphreys can see you for 1-5 visits depending on your needs but is not a long-term-needs counselor; her specialty is MBSR (Mindfulness-based stress reduction) and performance improvement.    SEE COURSE SCHEDULE FOR SMALL GROUP MINDFULNESS/ ANXIETY TREATMENT GROUPS  [email or web address]       []    REFERRALS OR OTHER ORDERS:    (You may not need an actual  referral to access these services)  Integrative services like Massage, Acupuncture  Get costs and schedule at our front desk or by calling [phone]. Special arrangements may be negotiated.    Call [phone] for UC imaging orders or   >>   [x]    FULLSCRIPT (on-line supplement dispensary)   If an order initiated during your visit-- check your personal email (or spam folder)  Dr Valeda Malm may wait until lab results are seen to complete an order, please let us know if that doesn't happen and you were waiting for it.  If you don't have a Fullscript link, please let Dr Valeda Malm know to set that up for you.    SUPPLEMENT CHANGES OR ADDITIONS:    See above or....  >>SBI PROTECT EXPERIMENT, IN LIEU OF IVIG FOR NOW!   []    Rx / PRESCRIPTION written for:  >>        [x]  GROUP CLASSES  INCLUDING MINDFULNESS TRAINING  [facility] ([facility], [place])    CHECK OUR WEBSITE FOR CLASS OFFERINGS AND COSTS.  [email or web address]      [x]      FOLLOW UP VISIT    Please make appointment to go over any lab work, don't wait for a reminder from [place], especially for nonUC tests that you will not see in MyChart.  For each follow up appointment, we will be asking you to complete one or two electronic questionnaires before your visit so please check your UCMyChart in the days prior to your next visit and complete these important health status forms.   These help [place] partner with you for your health more efficienctly  Please bring your Integrative Medicine folder, all prior handouts and test reports as well as updated lists of medications and supplements to each visit, since electronic access to your data is not as easy as it should be!      -----------------------------------------  Please review above recommendations and TLC (Therapeutic Lifestyle Changes).   We advise gradual changes, staggered entry of new components in order to determine pros/cons of various interventions.   It took time to get out  of balance and restoring balance may be a long, worthwhile journey!  We look forward to partnering with you for your wellness.      Be well,  Phyllis Ginger, MD  UC Dept [facility], Adjunct Associate Professor  Integrative/Functional  Medicine Consultant     [facility]  [facility]  [address]  [address]  [place], [place] [address]  [phone])  Fax: [phone]  (clinic hours Mon ALL [place], Tues PM,  Wednesday AM, Thurs ALL DAY, Fri out of office     Integrative Medicine Program Director: Ester Rink, PhD  Integrative Medicine Clinical Director: Ancil Linsey  Patient Services Representative:    Chief Medical Assistant:  Ms. Ciro Backer  EXTENSION: [phone]  [x]      GIMAP STOOL TEST RESULTS  Date specimen was collected: 07/05/17    Please see more detailed information about this test at the [employer] website [email or web address]. There is an excellent video link to explain the sections of the test as well as access to the printable white paper by the lab.   Notes   If abnormal:   ECOLOGY     ACUTE PATHOGENS   None   Any of these are potential triggers for significant diarrheal illnesses, some bacterial, viral or parasitic verified as such by the  [facility] for Disease Control, [email or web address]). Some may be present without causing diarrhea and may be associated with non-diarrheal conditions. Others may just be captured passing through and not cause disease.  TO DO:  Address relevance to your condition with the clinician, may need Rx or supplement to combat these   H.P..=  Helicobacter Pylori  (gastric germ) Absent   A bacteria present more commonly in stomach and upper GI tract, can cause or relate to gastritis, stomach ulcers and stomach cancer risk.  If present without symptoms, can still be important due to ability to reduce stomach acid and affect gut defenses and absorption of many nutrients like iron and B12, etc.    Presence or absence of  additional HP genetic markers called Virulence Factors can increase the risk of HPylori infections  TO DO: clinician MAY propose options to treat this and having an account at Fullscript will be suggested to help with explaining options   NORMAL BACTERIA Normal ranges  SL Imbalanced If some are low end or below normal, this could be because of antibiotic use, poor dietary exposures.  Imbalances in good bacterial ratios can also be important.  Too much of certain categories and too few of other can be DYSBIOSIS * or imbalanced ecology. It is believed to be healthier to have more Bacteroides families relative to Firmicutes families. F > B may also have impact on obesity and ability to lose weight    TO DO: a healthy plant-food RICH diet can help tremendously, avoiding antibiotics and supplementing diet with fermented foods and supplements need to be discussed   POTENTIAL HARMFUL BACTERIA  (Opportunistic) STAPH Present If one or several are in high number, this could be contribute to DYSBIOSIS, germs taking advantage of inadequate defenses by good bacteria, leading to potential for inflammation via the production of chemicals called LPS (Lipopolysaccharides); Several are known to correlate with some autoimmune conditions either through the LPS or molecular mimicry, genetic similarity to our own tissues.    TO DO: We will want to discuss correcting imbalances in helpful and harmful bacteria, either through probiotic rich diets, supplements and/or weeding out the potentially harmful germs through Rx or supplements.   YEASTS/ FUNGUS Absent   If present, could be from food exposures,  repeated exposures to antibiotics, excesses of dietary simple sugars, and/or insufficient good bacteria to compete out the fungi.    TO DO:  If present,  probiotics especially containing SaccB (Saccharomyces boulardii) can be helpful. Also, intensive reduction in dietary sugars along with some antifungal Rx or supplements will be  discussed.     VIRUSES  CMV/EBV Absent   If present, could be food/water borne exposures  Most people have been exposed to these viruses but a persistent body burden/ load of these viruses detected in the stool test could indicate ongoing viral trigger for disease within or outside the gut.  TO DO: support gut health with diet, stress modification and some supplements   PARASITES  Protozoa/ Worms Absent   If present, could be food/water borne exposures. Can just be passing through or triggers for disease in and outside the intestines.  Worm testing added in Jan '18  TO DO:   Special botanical or Rx treatments for these may be indicated depending on your symptoms and history.         DIGESTION     ELASTASE   Normal > 200  Ideal may be double that Normal range  500 If low, suspected low production of pancreatic digestive juices, which can be an indication that the pancreas is not getting signals from stomach acid to pass the baton to the pancreas to do it's job (HYPOchlorhydria).   If you are taking any antacid medication (examples: omeprazole, or ranitidine) this will be very important to address but do not quit Rx medications cold [place]. Discuss tapering dose with your clinician/s.    TO DO: healthy plant-food rich diet, stress modification since stress/cortisol reduces digestion, may need to supplement your meals with dietary aides with enzymes, betaine HCL and/or bile , in discussion with your clinician.   STEATOCRIT=   FAT LOSS INDEX  Normal < 15% Normal range   If elevated, suspect poor digestion of fats, possibly related to poor pancreas function, or maldigestion from reduced acid or inefficient bile production, as in cases of absent or diseased gall bladder or reduced gall bladder function.    TO DO: healthy plant-food rich diet, stress modification since stress/cortisol reduces digestion, may need to supplement your meals with dietary aides with enzymes, betaine HCL and/or bile , in discussion with  your clinician.   IMMUNE DEFENSES     SECRETORY IgA (sIgA)  Normal >510 -2010 LOW'  208   If low, then first line intestinal defenses are depleted or insufficient for keeping undesirable bacteria/viruses/parasites in check; We may discuss supplementing the immune defenses with IgG and/or colostrum supplements.  If high, then intestine may be actively engaged in a defense process and we will explore what those triggers are.    TO DO: address causes for imbalances with attention to diet and some supplements such as probiotics, IgG (immuneglobulin) or colostrum, etc.   WHEAT/GLIADIN Antibodies to gliadin  (Special forces against wheat or similar proteins)  Normal 0-157   Normal range  75 If over the somewhat arbitrary number of 157, indicates intestinal defenses against wheat proteins/ gluten is on high alert;   If the number is normal here but the total of secretory IgA is below normal, suggests that the antibodies against gluten might be higher if the total sIgA were normal.    TO DO: eliminate wheat and keep other grains in diet low as well. If one is already gluten zero, the marker could indicate exposure to other similar proteins, such as in oats/corn, etc  Discuss further with a functional medicine knowledgeable dietician.   INFLAMMATION     CALPROTECTIN    OLD BAR:  Normal < 50  Borderline 50-120    NEW BAR < 173 NORMAL   Normal range     A chemical made by White Blood Cells (Neutrophils). If elevated, a sign of inflammation, separates more benign Irritable bowel (IBS) from Inflammatory Bowel diseases (IBD) like colitis.  TO DO: Treating the underlying cause of the inflammation if from food or dysbiosis or infection. Antiinflammatory and gut healing supplements like probiotics, turmeric and/or L glutamine combinations are available. Discuss with clinician.     If elevated, we will want to retest this to make sure the number is normalizing. A colonoscopy may also be necessary to address high levels of  calprotectin out of concern for damage to the colon lining.   BLOOD IN STOOL None If present, can be a marker for bleeding intestinal process or hemorrhoids   OTHER     B GLUCURONIDASE = TOX RECYCLER  Normal < 2486 Normal range   Made by some bacteria in the intestinal tract. If elevated, it also supports a concern for DYSBIOSIS, and when toxins tagged by liver/gut for removal, the toxins may be REabsorbed too much and this may overload liver detox abilities  TO DO: Probiotics may be indicated to help improve healthier balance of bacteria in the gut   ADD ON:  ZONULIN  Normal < 15-107   NA Elevation may correlate with increased intestinal permeability  I am no longer doing this test for most people since the cost of the test did not add significantly to the benefit of knowing the results.   ANTIBIOTIC RESISTANCE GENES FEW Present Tests for sensitivity to certain  Prescription antibiotic medications.  May help with choices of medications if antibiotics are ever needed in the future.  Present means your bugs are smart and may outsmart the antibiotic category of meds   * Dysbiosis (also called dysbacteriosis) is a term for a microbial imbalance or maladaptation on or inside the body, such as an impaired microbiota. Dysbiosis is most commonly reported as a condition in the gastrointestinal tract, particularly during small intestinal bacterial overgrowth (SIBO) or fungal overgrowth (SIFO). It has been reported to be associated with illnesses, such as periodontal disease, inflammatory bowel disease, chronic fatigue syndrome, obesity, cancer,bacterial vaginosis, and various colitis.

## 2017-08-01 NOTE — Unmapped (Signed)
[facility]               FOLLOW UP PATIENT VISIT NOTATIONS                  For Emily Bonilla                               Clinician: Phyllis Ginger, MD    Chart notes (UC, and CareEverywhere), Problem List, Labs, Allergies and Medications are reviewed in preparation for current visit.    PCP: Court Joy, MD  Referred by: DR Arnetha Massy, PhD    -----------  HPI:  Emily Bonilla is a 57 y.o. who is here for her follow up consultation with [facility].    Patient's initial reasons for seeking consultation:      1. Mast cell activation syndrome (CMS Dx)     2. CVID (common variable immunodeficiency) (CMS Dx)     3. Multiple allergies     4. Anaphylaxis, subsequent encounter     5. Stress     6. B12 deficiency      ALREADY STARTED INJ B12, NOTING ENERGY DIFFERENCE, LESS IMM REACTIVITY, LESS BENEDRYL NEED   7. 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 Glucose intolerance (impaired glucose tolerance)      WAS STARTED IN METFORMIN   9. Hypothyroidism due to Hashimoto's thyroiditis      RECENT DOSE INCREASE ON RX T4   10. Chronic diarrhea     11. Hyperparathyroidism (CMS Dx)      SEEN BY ENT/ PARATHYROID = DR Vonita Moss; DOUBTS ADENOMA > PTH / VIT D MONITORING     No problems updated.  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 Dr Rogelio Seen( Allergist), Dr F Awan(hematology), Dr Leonard Downing( neurologist). ??Although, I would go to [facility] as needed for tweeks in my care.   IV fluids were very important to keep my body stable. ??It's amazing how good I feel after getting fluids. ??When my body stresses my Mast cells flair, I flush more and have increased diarrhea which is not helpful for fluid  retention, increased inflammation, brain fog, extreme fatigue, BP drops with passing out( syncope) and until the last 2 years my lungs were very bad. ( I'm appreciative that the last two years my lungs have been great!!), itching, VCD, MTD issues, and two more Life flight transports needed in 2009, 2011 both while on vacation out of state. I had severe reactions that could not be controlled by local ER so I was air vac to hospitals that knew how to treat Mast Cell Activation. ?? In 2007 Dr Carmon Ginsberg Awan(he is at [facility] now)Hematology was brought onto my care. ??   He evaluated me from top to bottom. ??Found that I would do better on IVIG, IV fluids, IV iron and magnesium. ?? B 12 levels fluctuated but never as low as they are now. ??I Always had issues with folic acid levels. But are ok now.   Back to Dr Nelle Don after my veins developed so much scar tissue and Emergency Response teams could not get IV in during  emergencies and the IVIG replacement needed my first port was put in 2008. ??I had that port until 2013. A new port was put in on opposite side in 2014 due to sluggish Infusions. [facility] [place] [facility] put me on Daily IV fluids1000 cc of LR, IV push benadryl q4-6 hours via my port. ??Steroids as needed. ??I was in a awful flair for several years. ??As hard as it was to keep up with the regiment ??I felt so much better and was able to enjoy life more, see friend and travel.. The summer of 2015 was when I kept having increased seizures and syncope in grocery stores produce dept( [employer] and bi lo) the seizures wer increasing big time. ??So the Allergist put me back on doxipen and steroids with my IV benadryl and IV fluids I got better. June 2017 broke legs after syncope from smelling perfum. Allergic reactions to O2 mask and antibiotics and   pain meds triggered symptoms and my face, lower lip, eyes swelled and then became infected. ??I was hospitalized 3 months for face surgery, left tibia fib  multipal break and mast cell reaction. CVID was kicking in as my IgG levels were 319. My port was taken out and a pic line put in just incase my port compromised. ?? My port came back clean from infection. ??After I was discharged from hospital to rehab nursing home to slowly ambulate as I had not been at bearing for 4 months. ??I stayed 3 weeks as they had cockroaches and could not take care of my dietary needs. ??I left and rehabbed myself. ??Since then I have had flairs related to allergy to hardware to left tibia fib and the transition off my IV fluids and trying to find a hospital that could treat me/not afraid of my conditions. ??That took 4 years!!!!!!!! ??     During all the medical issues I was going through a divorce Oct 2012.   That was rough transission as my drs of 14 years retired and/or left [employer] [place] after affordable health care act started to be implimented.   Lots of changes and I went to get psychotherapy weekly to bi weekly for the next 4 years. ??This was very important to my over all health. My ex and I went to marriage counciling 4 years weekly to deal with the stressors of my illness on our marriage before the divorce.     -------------------------------------  ROS/ SYMPTOM TRACKER (IFM MATRIX REVISED)   X= Moderate risk domains; XX= Higher risk domains  O= Lower risk domains  CC (pt's own goals):  Identifying possible triggers of inflammation   Inflammation,   Pain   Anaphylaxis       Patient care team:  Patient Care Team:  Court Joy, MD as PCP - General (Internal Medicine)  Christena Flake, DO as Consulting Physician (Allergy and Immunology)     08/02/2017   06/27/2017    First visit: 06/09/2017       LABS    X  Several abnl metabolic markers       There is no immunization history on file for this patient.    Lab Results   Component Value Date    HMDIABEYEEX Normal DM eye exam/Dr Klemencic/[facility] 07/28/2017           REVIEWED RECENT UC LABS    >>GIMAP IF MOLINA WILL COVER  >>B12  INJ  >>HEM ONC EVAL FOR ASSIST WITH MGMT/ PORT  CareEverywhere reviewed:   EXAM      Several exam findings         HERE ALONE  ATTENDANT IN LOBBY      MSQ  (Medical Symptom Questionnaire)  Subjective assessment of symptoms, ideally reduced during course of therapy     129 152  MSQ > 30         From Functional Med Matrix        SOCIAL /MENTAL:   X  Several historic or current challenges to emotional wellbeing, and/or psychological balance    Including hx ACE, emotional traumas, safety issues, anxiety, depression, etc       COMPLEX HEALTH STORY  ANXIETY  MAST CELL ISSUES    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, [employer], [place]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    RAISED BY  MOM/ STEP DAD IN [place]    HX OF WORK AS ICU NURSE    COGNITIVE IMPAIRMENTS WITH ANAPHYLAXIS    G1P1 (DAU '85)  GDAU '17 SICK BABY    WT UP 70# IN PAST 8 MONTHS (CAN ONLY ACCOUNT FOR 50# FROM AUG'18 IN RECORD)     HAS EMERGENCY GMS MONITOR, PERSONAL ON STAR  ALSO HAS AIDE TO ALL APPTS    HAS MOVED FOR HEALTH CARE MANY TIMES OVER PAST 7 YRS     ASSIMILATION/ Digestion:   X  GI / Digestion & Absorption issues       Issues considered include history/current use of NSAID, PPI, H2Bs  increasing intestinal permeability;  Surgical interventions such as Cholecystectomy, Appendectomy, partial or more extensive regional intestinal resections,compromising digestive resilience,  Diagnoses of GERD/HH/ IBS vs IBS    HX CHOL'Y '15  ?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  IgG food testing:  No results found for: AIGGCASEIN, AIGGWHEAT, AIGGOAT, AIGGMAIZCORN, AIGGPEANUT, AIGGSOYBEAN, AIGGEGGWHOLE    FOOD SENS TESTING IN '98 ([facility]T)  FINDING PRESERVATIVE/REACTIVITIES    GIMAP/PCR stool testing:      ONGOING CHR DIARRHEA    >>GIMAP   BM 15X THIS AM,  2 KINDS OF DIARRHEA SINCE CHOL'Y    Ulcers;Colitis;Gallbladder Disease;Diarrhea;Gastroenteritis (nausea and vomiting)    HX CHOL'Y '15  ?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    ABD BLOATING  WT GAIN, 5#s UP AND DOWN,    Mag:   Magnesium    Date Value Ref Range Status   06/09/2017 2.0 1.5 - 2.5 mg/dL Final     No results found for: MAGNESIUMRB  OPTIMAL: Serum mag >2.1; RBC mag >6     DIET/ >>>>>>>>>>>>>>>>>>>  NUTRITIONAL BACKGROUND   OFF RICE, SOY  LIMITS GLUTEN,   MAKES OWN BREAD  EGGS SEEM OK       DEFENSE/ REPAIR/ Immune-Inflamm:  XX  Risks for infections or inflam/   autoimmune issues    Issues considered include chr/recurrent NSAID use, recurrent illnesses and/or repetitive antibiotic use, needs for chronic or intermittent topical/oral/ inhaled/ injectable streoids; personal and family history of Inflammatory and/or Autoimmune disorders,       NEEDS FLUID FOR MASTOCYTOSIS  >>DR SYED AHMAD FOR ?PORT    HIGH CRP  Lab Results   Component Value Date    CRP 17.8 (H) 06/09/2017      HX:  5+ ABX PAST 5 YRS   10+ IN LIFE   Pneumonia, sinus infection, skin infections  BITTEN BADLY BY RED ANTS AGE 60,    5+ STEROID COURSES   Mast Cell Activation Syndrome,     MAST CELL DEGRANULATION ISSUES  Zollie Beckers  REED) DX '96 (DR HARBUT)  FAM HX MAST CELL ISSUES (MAT AUNT D.58, MAT UNCLE, MAT GF D. 49)  VOCAL CORD DYSKINESIA,  S/P HELIOX TX,     ANAPHYLAXIS > LIP ANGIOEDEMA > SURGERY TO REPAIR    MONTHLY IVIG, SINCE '08 -'14  BEST IgG 700s  '14-'17 PATCHWORK DOCS WITH HYZINTRA  '18-PRESENT HAS A TEAM AT UC  OFF IVIG PAST 2 YRS AND AND WT HAS GONE UP FROM 89# 2 YRS AGO    BOTOX Q3 MO FOR PAIN MGMT      Lab Results   Component Value Date    WBC 12.6 (H) 05/31/2017   WBC:  %N: 59.7  %L: 27.7  %M: 11.1  %E: 1.1  %B: 0.4    No results found for: HSCRP,   Lab Results   Component Value Date/Time    CRP 17.8 (H) 06/09/2017 1549     OPTIMAL: CRP<1  Lab Results   Component Value Date/Time    ESR 26 06/09/2017 1549    ESR 29 05/31/2017 1118     No results found for: SEDRATE  No results found for: ANA  No results found for: RF     ENERGY/ mitochondria:   X  Cellular Energy compromises    Issues considered include conditions potentially causative of mitochondrial dysfunction,   compromise of Krebs/ electron transport, ATP production and oxidative stress  Factors including chronic /recurrent cephalgia, central and/or peripheral neurologic deficits, balance issues; nutrient deficiences such as cofactors for ETC: B2, B6, B12, folate, CoQ10, iron or diets deficient in antioxidants      B12 VERY DEFICIENT BUT CONCERN FOR ANAPHYLAXIS AND RECOMMEND A NON-PRESERVATIVE PRODCT  >>METHYL B12 COMPOUNDED FROM TRISTATE  >>MONITOR W/ NURSE FOR FIRST INJECTION    HAS BENEFITTED IN PAST REPORTEDLY FROM IRON INFUSIONS, ONLY CLUE WOULD BE LOW END MCV.Marland Kitchen  GIVEN HIGHER POTENTIAL FOR ANAPHYLAXIS, DEFER IRON OPTIONS TO HEMONC      SEIZURES   LOC    SZ IN GROCERY STORE/S > HOME DELIVERY    Lab Results   Component Value Date    HGB 14.8 05/31/2017    HCT 45.2 (H) 05/31/2017    MCV 84.9 05/31/2017    PLT 317 05/31/2017       Lab Results   Component Value Date    VITAMINB12 163 (L) 06/09/2017     Lab Results   Component Value Date    FERRITIN 46.0 06/09/2017     Homocysteine   Lab Results   Component Value Date    HOMOCYSTEINE 8.8 06/09/2017   ,(Hcy OPTIMAL <7):     MMA :  (OPTIMAL <150)  CoQ10 :  (OPTIMAL >0.86)     COMMUNICATION/ hormones:  XX  Hormonal concerns  Evidence of cellular communication compromise,    Issues considered including HPA Axis imbalance, autonomic dysfunction, glycemic dysregulation, centripetal adiposity, thyroid and/or gonadal dysfunction       BROTHER BRITTLE DIABETIC (DM2)  MOTHER THYROID CANCER, SEV OTHER RELATIVES,    HX DUB, > HYST '09  HX SHOCK/ COLLAPSE DAY 1-2 OF PERIODS    THYROID INFLAMES  ON RX T4 110  Lab Results   Component Value Date    TSH 6.11 (H) 06/09/2017    T3TOTAL 98.0 06/09/2017    FREET4 0.81 06/09/2017    THYROIDAB 747.0 (H) 06/09/2017     Lab Results   Component Value Date/Time    T3REV 14.8 06/09/2017 1549     TT3/ RT3: __  (TT3/  RT3 OPTIMAL 6-10)  Lab Results   Component Value Date    HGBA1C 7.3 (H) 06/09/2017   (OPTIMAL <5.3)     ELIMINATION/ detox:   XX  Phase  I/ II Detox pathway, GI or renal elimination risks    Issues considered include poor phytonutrient provision and hydration, gastrointestinal dysfunction, slow transit compromising first line detox as well as hepatic overload from environmental exposures, tobacco 1st-3rd hand, EtOH, hobbies, dental amalgams, and genomic uniqueness such as SNPs for COMT/SOD, etc    .  MULTI CHEM SENSITIVITIES    Lab Results   Component Value Date    GGT 53 06/09/2017   GGT (OPTIMAL < 30)    >>L-GLUTATHIONE/ NAC OPTIONS      MULTIPLE CHEM SENSITIVITY  USES ION PERSONAL CARE PRODUCTS ,    Lab Results   Component Value Date    ALT 14 10/28/2016    AST 16 10/28/2016    GGT 53 06/09/2017    ALKPHOS 110 10/28/2016    BILITOT 0.3 10/28/2016     GGT (OPTIMAL < 30)  URIC ACID No results found for: URICACID  Optimal < 6   STRUCTURAL INTEGRITY:  X  Structural issues     Issues considered include birth traumas, spinal segment compromises, MVA, fractures, repetitive motion injuries and persistent ergonomic stressors      DENTAL SURGERIES    R TIB FRX > TITANIUM, REACTIVE. AND TOOK OUT TITANIUM 10 MO AGO     DENTAL SURGERIES    R TIB FRX > TITANIUM, REACTIVE. AND TOOK OUT TITANIUM 10 MO AGO    HX PORTS  PLACED POST OP LIP ANAPHYLAXIS    Lab Results   Component Value Date    VITD25H 10.5 (L) 06/09/2017     (OPTIMAL 50-60)  PTH:   Lab Results   Component Value Date    PTH 151.0 (H) 06/09/2017    CALCIUM 9.4 05/31/2017   Optimal PTH <30     TRANSPORT / CV:  X  Circulatory concerns    Issues weighed include evidence of vascular compromise, inflammatory contributors to endothelial dysfunction; hypertension, coagulation defects, anticoagulation, medications causing dysregulation of vascular stability    Vitals:    06/27/17 1522   BP: 138/82   Pulse: 84   SpO2: 96%       Wt Readings from Last 3 Encounters:   06/29/17 (!) 222 lb 4.8 oz (100.8 kg)   06/29/17 (P) 222 lb (100.7 kg)   06/27/17 221 lb 3.2 oz (100.3 kg)        HX:   WPW  WT GAIN    RX:    Vitals:    06/09/17 1348   BP: 132/68   Pulse: 87   SpO2: 97%     Wt Readings from Last 3 Encounters:   06/09/17 218 lb 9.6 oz (99.2 kg)   06/07/17 217 lb (98.4 kg)   06/02/17 217 lb 3.2 oz (98.5 kg)   WT 165  8/'18     8  of 8 Risk Factor Domains                            THERAPEUTIC LIFESTYLE CHANGE (TLC) TRACKER      08/02/2017    06/27/2017    Initial visit: 06/09/2017        CHANGE READINESS:  Precontemplation, contemplation, preparation, action, and maintenance  PREP PREP   MOVEMENT  Assessing for compromises including sedentary lifestyle  or work, impaired mobility, or energy    MOVEMENT:      1         2       3         4        5        X     low risk     >>>>>>  higher risk       SLEEP  Assessing compromises including difficulty with sleep onset, maintenance or quality, or quantity <7 hours routinely, shift work, dark/light reversal    EPWORTH:  No flowsheet data found.     SLEEP:     1         2       3         4        5       X     low risk     >>>>>>  higher risk    NEWER PUFFING WITH SLEEP SINCE WT GAIN  SUMMER '18     RELATIONSHIPS & SUPPORT NETWORK:  Assessing support for emotional health as well as initiating and maintaining TLCs     RELATING:      1         2       3         4        5       X     low risk     >>>>>>  higher risk    CHURCH  DAU  BABY GRANDDAU     EMOTION INVENTORY:  Assessing concerns for historical or ongoing physical or emotional safety, poor self efficacy or esteem, labile mood or frequent anxiety, dysthymia     EMOTIONS:        1         2       3         4        5       X     low risk     >>>>>>   higher risk   TOX/DETOX  Assessing concerns for former or current tobacco or excessive  EtOH, or substance exposures; occupational or hobby related xenobiotic or VOC*/ heavy metal exposures:  Chemotherapeutic agents; Rx polypharmacy      *VOC: volatile organic compounds/ or Hazardous materials  Resources cited: [email or web address]   DETOX:     1         2       3         4        5            X    low risk     >>>>>> higher risk   RESTORATIVE PRACTICES:  Assessing concerns for sufficiency of self care practices, creation of quiet, implementation of mindfulness, restorative breathing practices, touch, disconnection from [place], etc       MBSR      1         2       3         4        5        X     low risk     >>>>>    higher risk    STARTED WITH MEDITATION IN '98,   AWAKENS QUIET,  AM MEDITATION NOW  HAS APPS ON  PHONE, INSIGHT TIMER  Tend flowers, meditation, watch [employer] station, play memory games   [place], grandchildren, flowers, nature, travel, family.   >>MBSR TOOLS, Rx   DENTAL  Assessing  concern for hygienic, surgical, or ongoing ORAL Inflammatory/ Infectious processes       DENTAL:      1         2       3         4        5           X    low risk     >>>>>    higher risk    Grinding;TMJ;Gum Disease;Root Canal/s   All done from 2004-2012.  April 2019 oral surgery to remove a decayed tool part of a 3pc bridge. Went to OR and intubated and over night in hospital.      HYDRATION:   Assessing sufficiency of water intake, through free water and/or food fluid    >>Advised on hydrating to point of light yellow urine   H2O:     1         2       3         4        5       X     low risk     >>>>>    higher risk  Currently at  - - oz water daily    10 X 16 OZ WATERS DAILY,  SO DRY,  MISSING IVF  PER PORT    DESIRES:  IVF  FLUIDS LR 500 CC/NIGHT >  X 30D THEN 1000 CC Q 3D     DIET/ NUTRIENTS:  Assessing phytonutrient intake levels, individual's of awareness and implementation of food as medicine, their use of whole foods, phytonutrient diversity, avoidance of high refined sugar/ processed foods       DIET:       1         2       3         4        5        X     low risk     >>>>>    higher risk    OFF RICE, SOY  LIMITS GLUTEN,   MAKES OWN BREAD  EGGS SEEM OK   SUPPLEMENTS:                                        FS    HIGHLY PRESERVATIVE SENSITIVE!!      RX   T4 112  DOXEPIN 10MG  26MG  (  ZYRTEC UP TO  6/D  PPI OR PEPCID    BENEDRYL 150 MG/D  SINGULAIRE  XOPENEX  ALLEGRA  BUSPAR    >>mB12 TRISTATE  >>IVF/ HEM ONC REF  >>L-GLUTATHIONE VS NAC RX   T4 112  DOXEPIN 10MG  26MG  (  ZYRTEC UP TO 6/D  PPI OR PEPCID  BENEDRYL 150 MG/D  SINGULAIRE  XOPENEX  ALLEGRA  BUSPAR    NO IVF   USED TO GET 500>1200 ML    SUPP:  OFF  TRIED MANY PROBIOTICS         KEY:  >>: POC;  Sx/RISKS 1  LOW < <--5  HIGH   C: CONTINUE  S: STABLE  R!: RESOLVED!  P: PERSISTENT  E: EPISODIC  W: WORSE  B: BETTER/IMPROVED  MBSR: MINDFULNESS BASED STRESS REDUCTION   ZO:[id number] ENZYMES  VOC: VOLATILE ORGANIC POLLUTANTS  ---------------------  INTEGRATIVE MED FLOW SHEET  New Patient Md Consult Questionnaire 05/12/2017   Which level of study have you completed? Two-year college   Other Source: Meditation intergrative med dr refered me   Overall health? Fair   Goals? Identifying possible triggers of inflamation   1st (of 3) Top Health Concern: Inflammation,   2nd Top Health Concern: Pain   3rd Top Health Concern: Anaphylaxis   Maximum Height: 5'7   Maximum Weight: 212   Minimum Adult Weight: 89   Weight Recently Up/Down: Up   Courses of antibiotics in the past 10 yrs: 5+   Courses of antibiotics lifetime: 10+   Type of infections: Pneumonia, sinus infection, skin infections   Steroid/Prednisone courses (oral/injection) past 10 yrs: 5+   Type of conditions: Mast Cell Activation Syndrome,   # of current prescriptions? 5 or more   Multivitamins? No   Individual Vitamins/Minerals? No   Digestive Aides? No   Energy Supplements? No   Liver Support Supplements? No   Anti-Inflammation/Pain? No   Mood/Sleep Supplements? No   Allergy/Cold/Sinus? Yes   Antihistamines (Claritin,Zyrtec) Yes   Brand: Certirizine, Gluten free Members MarksEducation officer, environmental)   Dose - Frequency 1 @night .  I can increase up 6 a day during severe flairs   Used Since 15+ years   Nasal Sprays No   Mucinex (Guaifenisin) No   Herbal - Elderberry No   Herbal - Echinacea No   Allergy/Cold/Sinus Other Yes    Brand: Benadryl, Members Marks (sams)   Dose - Frequency 150 mg a day. Dose changes fro 25 mg to 300mg  daily   Used Since 25 years +   Herbs/Tinctures/Essential Oils No   Physical therapy? Yes   Helpful? Yes   Currently use? No   How often? When I broke legs therapy. I rehab myself after Rehab center was inadequate    Talk therapy/counseling? Yes   Helpful? Yes   Currently use? Yes   How often? Dr Verna Czech, Meditation works best for me.   Who/where? Dr Dan Humphreys, [facility].  Meditation works best for me.   Massage therapy? Yes   Helpful? Yes   Currently use? No   How often? Insurance does not [employer].   Who/where? [place] [facility] tergrative tgerapy   Yoga? Yes   Helpful? Yes   Currently use? Yes   How often? Stretching after dinner   Who/where? At home alone.    Chiropractic therapy? No   Helpful? No   Currently use? No   How often? N/A   Acupuncture? Yes   Helpful? Yes   Currently use? No   How often? Need insurance to cover.   Meditation? Yes   Helpful? Yes   Currently use? Yes   How often? Nightly, and during high stress levels   Who/where? Dr Clarene Critchley Deborah Chalk   Tai Chi/Qigong? Yes   Helpful? Yes   Currently use? No   How often? [facility] [employer] started in 1998   Who/where? None    Other Integrative Healthcare Providers: I was getting massage therapy but I pay out of pocket.  Not in budget.    Regular dental exams? No   Last visit: March 2019   Dental work: Grinding;TMJ;Gum Disease;Root Canal/s   When? All done from 2004-2012.  April 2019 oral surgery  to remove a decayed tool part of a 3pc bridge. Went to OR and intubated and over night in hospital.   Cavities/fillings? Yes   How many? 10   Type Gold or Silver/Mercury Fillings;Porcelain Fillings   Brushing frequency: 2 times a day   Flossing frequency: 3 times a week.  Friction was triggering anaphylaxis    Floss with? Floss;Waterpik   Outside of the [place] Yes   Wilderness camping? Yes   Where? [facility]    Sexual  health/function concerns: No   AVG hrs of sleep per night? 6-8   Avg # of times awaken per night? 1-2   Response to waking up? Depending on my pain level.  I do get deep sleep.  But when pain is bad I have sleep studies that show alpha wave intrusion and hundreds of wake up.  My snoring has stopped on antihistamine    Read before falling asleep? No   Watch TV before falling asleep? Yes   TV in the bedroom? No   Work on computer/laptop/smartphone before falling asleep or during the night? No   Taken medications in past for insomnia? Yes   Type: Ambien,   had adverse reactions to it.    Currently take sleep aides? No   Awake feeling rested? Yes   # of times hit the 'snooze' button: 1-2   First 30 minutes upon awakening: Meditate/Pray;Review to do list for the day   Exercise frequency: 1x/week   Track/monitor activity? Yes   Tracking device: Other   Other: [employer] and sprint daily walk monitor, pulse bp    Leisure activities: Disabled, i try to exercise with out stressing my body out which triggers mast cell degranulation.   Able to walk 3 blocks/swim/use exercise bike? No   Walking: 1x/week   Minutes/Day 15   Stretching (yoga, pilates, bands, etc.) 1x/week   Minutes/Day 3   Strength Training/Weight Lifting I don't do this   Group Fitness (zumba, kickbox, crossfit, etc.) I don't do this   Aerobic (biking, running, swimming, etc.) I don't do this   Physical limitations preventing exercise: Ankle pain;Back pain;Fatigue;Shortness of breath;Other limitations   Other limitations to exercising: Anaphylaxis,  abdominal distension, and stressor to my body triggers me.     Severe problems: Ulcers;Colitis;Gallbladder Disease;Diarrhea;Gastroenteritis (nausea and vomiting)   Bowel Frequency: Several daily   Intestinal Surgery: Gall bladder   Intestinal Scopes? Both   Date: Every 3-5 years   Change in eating habits due to health? Yes   How? No preservatives, no dyes, no chemicals, food allergies, digestive problems    Dietary  Lifestyle: Gluten Free   Special Diet/Nutritional Prog? Yes   Special Diet Type: Chemical free, as gluten free as possible, dye free, preservative free.   Track/monitor calories/food intake? Yes   System (MyFitnessPal, Spark People, etc.) My drs orders and allergic diet.   How often? Daily   # Meals per day: 3   Snack between meals? Yes   Can afford the food you need? No   Adequate facilities for food prep? Yes   Cultrual food preferences No   Any food cravings? No   Foods you avoid? Yes   List: Allergic diet, multipal chemical syndrome    Make own food choices/control food environment? Yes   Food Problems: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 No results found for: HSCRP,   No results found for: ANA  No results found for: RF      EXAM:  Vitals:    08/01/17 0822   BP: 122/78   BP Location: Right arm   Patient Position: Sitting   BP Cuff Size: Regular   Pulse: 80   SpO2: 97%   Weight: 215 lb 3.2 oz (97.6 kg)   Height: 5' 7 (1.702 m)     Body mass index is 33.71 kg/m??.   Wt Readings from Last 3 Encounters:   08/01/17 215 lb 3.2 oz (97.6 kg)   06/29/17 (!) 222 lb 4.8 oz (100.8 kg)   06/29/17 (P) 222 lb (100.7 kg)       ==================================  Assessment/ Plan:  See Pt instructions for additional information  I spent___90+__min with this patient in face to face contact, reviewing history with > 90% of time spent in education, plan of care and counselling     1. Mast cell activation syndrome (CMS Dx)     2. CVID (common variable immunodeficiency) (CMS Dx)     3. Multiple allergies     4. Anaphylaxis,  subsequent encounter     5. Stress     6. B12 deficiency      ALREADY STARTED INJ B12, NOTING ENERGY DIFFERENCE, LESS IMM REACTIVITY, LESS BENEDRYL NEED   7. 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 Glucose intolerance (impaired glucose tolerance)      WAS STARTED IN METFORMIN   9. Hypothyroidism due to Hashimoto's thyroiditis      RECENT DOSE INCREASE ON RX T4   10. Chronic diarrhea     11. Hyperparathyroidism (CMS Dx)      SEEN BY ENT/ PARATHYROID = DR Vonita Moss; DOUBTS ADENOMA > PTH / VIT D MONITORING       Plan:  (see AVS and endnotes for more details)  Reviewed additional specific recommendations and TLC (Therapeutic Lifestyle Changes) documented in the AVS with pt who was advised on gradual changes, staggered entry of new components in order to determine pros/cons of various interventions.  Patient Instructions     INTEGRATIVE MEDICINE CHECK LIST   Please refer to the following outlines to verify the wellness advice and further testing we discussed.      TO DO LIST:  []       []    LAB ORDERS PLACED:  If at the end of your visit, please use our [place] office, 1st floor;   if later and/or fasting, you may use any UC lab site preferred.  UC drawn labs will be communicated via email.   If you have blood drawn at a non UC site, we will NOT be able to see those results and may only be able to review them with you at a visit.     Look for a results letter in your UC MyChart message basket 5-7 days after your results are completely back.  >>WILL WANT TO SEE A VITAMIN D LEVEL IN 3 MONTHS OR SO ALONG WITH PTH.  LET ME KNOW WHEN OTHERS ARE GETTING LABS TO SEE IF I CAN PIGGY BACK ON THEIR DRAW.     DON'T MIND NOT RETESTING THE B12 LEVEL BUT WOULD LIKE TO SEE THE MMA (Methylmalonic acid) AGAIN   []    GIMAP STOOL TEST KIT,  (results not visible in UC MyChart)      PLEASE MAKE APPT FOR 3-4 WEEKS ASAP AFTER SENDING IN KIT OR EVEN BEFORE YOU SEND IT IN ; DON'T  WAIT FOR [place] TO LET YOU KNOW THE TEST IS BACK, IT WILL NOT SHOW UP IN YOUR MYCHART FILE  >>   []    NEUROLAB [facility]) Neurotransmitter testing along with salivary cortisol/DHEA testing:  We provide the kit, you complete at home and send to [facility], results outside UC, 2-3 weeks turn around time.Marland Kitchen  PLEASE MAKE APPT FOR 3-4 WEEKS ASAP AFTER SENDING IN KIT OR EVEN BEFORE YOU SEND IT IN ; DON'T WAIT FOR [place] TO LET YOU KNOW THE TEST IS BACK, IT WILL NOT SHOW UP IN YOUR MYCHART FILE  >>     []    GENOVA KIT (results not visible in UC MyChart)   MAKE AN APPOINTMENT FOR THE TIMING LISTED (TAT)       >>   [x]    MIND BODY:  MAKE TIME FOR QUIET BREATHING, DISCONNECT FROM PHONE/TEXT/COMPUTER, SPEND TIME IN NATURE, GRATITUDE JOURNAL'  >>  >> 8 week MINDFULNESS TRAINING COURSES, CHECK WEBSITE FOR LATEST CLASS SCHEDULE      [x]    GUT HEALTH:  EAT REAL FOOD, NOT TOO MUCH, MOSTLY PLANTS!  >>FOR IMMUNE HEALTH, LET'S TRY ADDING SBI PROTECT IMMUNOGLOBULIN FOR SEVERAL MONTHS   [x]    IMMUNE/ ANTI- INFLAMMATION SUPPORT:  TO HELP YOUR BODY DEFEND ITSELF, LIMIT SUGAR TO HELP STRESS SIGNALS, SLEEP TO HELP BRAIN REPAIR,  >>RETRIAL VITAMIN D (D3 + K2) AS TOLERATED, ONCE A WEEK  >>SBI PROTECT (IMMUNOGLOBULIN) 1 TO 4 CAPS DAILY WORK UP SLOWLY.      [x]    HORMONE SUPPORT (ADRENAL, THYROID, INSULIN, GENDER):  SUGAR CAN CAUSE COMMUNICATION IMBALANCES, VITAMIN D IS A HORMONE WE MAKE FROM THE SUN!  >>GLAD ABOUT THE HIGHER DOSE OF THYROID T4,  VITAMIN D IS ALSO A HORMONE.!   [x]    ENERGY REGULATION:  NUTRIENTS SUCH AS VITAMIN B2, B12, IRON AND OXYGEN ARE ESSENTIAL FOR MITOCHONDRIA (YOUR BATTERIES)  >>LET'S INCREASE B12 TO EVERY OTHER DAY INJECTIONS.      [x]    DETOX SUPPORT:  HEALTHY GUT TRANSIT IS THE BEST DETOX! IF THE GUT ISN'T ABLE TO DO IT'S JOB,  THE LIVER WILL HAVE TO TAKE UP THE SLACK!  >>     [x]    TRANSPORT/ STRUCTURE:  GETTING NUTRIENTS THROUGHOUT THE BODY AND HOW WE MOVE THROUGH SPACE ALL CONTRIBUTE IMMENSELY TO OVERALL HEALTH. ...CONSIDER  YOUR SPINE AND JOINTS AS STARS IN A CONSTELLATION. MAKE AS MANY CONSTELLATIONS THROUGH YOUR DAY AS POSSIBLE NOT JUST A STATIC FEW POSITIONS!  >>     []    DIETICIAN VISIT : [facility] is no longer employing a dietitian,  Refer to   [facility], RD  [email or web address]  [phone]  Or  Jarrett Ables, RD  [email or web address]  [phone]      []    BEHAVIORAL HEALTH/ COUNSELOR VISIT Arnetha Massy, PhD, many insurances may cover; please check with your carrier) ; Dr Dan Humphreys can see you for 1-5 visits depending on your needs but is not a long-term-needs counselor; her specialty is MBSR (Mindfulness-based stress reduction) and performance improvement.    SEE COURSE SCHEDULE FOR SMALL GROUP MINDFULNESS/ ANXIETY TREATMENT GROUPS  [email or web address]       []    REFERRALS OR OTHER ORDERS:    (You may not need an actual referral to access these services)  Integrative services like Massage, Acupuncture  Get costs and schedule at our front desk or by calling [phone]. Special arrangements may be negotiated.    Call [phone] for UC imaging orders or   >>   [x]    FULLSCRIPT (on-line supplement dispensary)   If an order initiated during your visit-- check your personal email (or spam folder)  Dr Valeda Malm may wait until lab results are seen to complete an order, please let us know if that doesn't happen and you were waiting for it.  If you don't have a Fullscript link, please let Dr Valeda Malm know to set that up for you.    SUPPLEMENT CHANGES OR ADDITIONS:    See above or....  >>SBI PROTECT EXPERIMENT, IN LIEU OF IVIG FOR NOW!   []    Rx / PRESCRIPTION written for:  >>        [x]  GROUP CLASSES  INCLUDING MINDFULNESS TRAINING  [facility] ([facility], [place])    CHECK OUR WEBSITE FOR CLASS OFFERINGS AND COSTS.  [email or web address]      [x]      FOLLOW UP VISIT    Please make appointment to go over any lab work, don't  wait for a reminder from [place], especially for nonUC tests that you will not see in MyChart.  For each follow up appointment, we will be asking you to complete one or two electronic questionnaires before your visit so please check your UCMyChart in the days prior to your next visit and complete these important health status forms.   These help [place] partner with you for your health more efficienctly  Please bring your Integrative Medicine folder, all prior handouts and test reports as well as updated lists of medications and supplements to each visit, since electronic access to your data is not as easy as it should be!      -----------------------------------------  Please review above recommendations and TLC (Therapeutic Lifestyle Changes).   We advise gradual changes, staggered entry of new components in order to determine pros/cons of various interventions.   It took time to get out of balance and restoring balance may be a long, worthwhile journey!  We look forward to partnering with you for your wellness.      Be well,  Phyllis Ginger, MD  [facility] Dept Fam Comm  Med, Adjunct Associate Professor  Integrative/Functional Medicine Consultant     [facility]  [facility]  [address],  [address]  [place], [place] [address]  [phone])  Fax: [phone]  (clinic hours Mon ALL [place], Tues PM,  Wednesday AM, Thurs ALL DAY, Fri out of office     Integrative Medicine Program Director: Ester Rink, PhD  Integrative Medicine Clinical Director: Ancil Linsey  Patient Services Representative:    Chief Medical Assistant:  Ms. Ciro Backer  EXTENSION: [phone]  [x]      GIMAP STOOL TEST RESULTS  Date specimen was collected: 07/05/17    Please see more detailed information about this test at the [employer] website [email or web address]. There is an excellent video link to explain the sections of the test as well as access to the printable white paper by the lab.   Notes   If  abnormal:   ECOLOGY     ACUTE PATHOGENS   None   Any of these are potential triggers for significant diarrheal illnesses, some bacterial, viral or parasitic verified as such by the  [facility] for Disease Control, [email or web address]). Some may be present without causing diarrhea and may be associated with non-diarrheal conditions. Others may just be captured passing through and not cause disease.  TO DO:  Address relevance to your condition with the clinician, may need Rx or supplement to combat these   H.P..=  Helicobacter Pylori  (gastric germ) Absent   A bacteria present more commonly in stomach and upper GI tract, can cause or relate to gastritis, stomach ulcers and stomach cancer risk.  If present without symptoms, can still be important due to ability to reduce stomach acid and affect gut defenses and absorption of many nutrients like iron and B12, etc.    Presence or absence of additional HP genetic markers called Virulence Factors can increase the risk of HPylori infections  TO DO: clinician MAY propose options to treat this and having an account at Fullscript will be suggested to help with explaining options   NORMAL BACTERIA Normal ranges  SL Imbalanced If some are low end or below normal, this could be because of antibiotic use, poor dietary exposures.  Imbalances in good bacterial ratios can also be important.  Too much of certain categories and too few of other can be DYSBIOSIS * or imbalanced ecology. It is believed to be healthier to have more Bacteroides families relative to Firmicutes families. F > B may also have impact on obesity and ability to lose weight    TO DO: a healthy plant-food RICH diet can help tremendously, avoiding antibiotics and supplementing diet with fermented foods and supplements need to be discussed   POTENTIAL HARMFUL BACTERIA  (Opportunistic) STAPH Present If one or several are in high number, this could be contribute to DYSBIOSIS, germs taking advantage of inadequate defenses by  good bacteria, leading to potential for inflammation via the production of chemicals called LPS (Lipopolysaccharides); Several are known to correlate with some autoimmune conditions either through the LPS or molecular mimicry, genetic similarity to our own tissues.    TO DO: We will want to discuss correcting imbalances in helpful and harmful bacteria, either through probiotic rich diets, supplements and/or weeding out the potentially harmful germs through Rx or supplements.   YEASTS/ FUNGUS Absent   If present, could be from food exposures,  repeated exposures to antibiotics, excesses of dietary simple sugars, and/or insufficient good bacteria to compete out the fungi.  TO DO:  If present, probiotics especially containing SaccB (Saccharomyces boulardii) can be helpful. Also, intensive reduction in dietary sugars along with some antifungal Rx or supplements will be discussed.     VIRUSES  CMV/EBV Absent   If present, could be food/water borne exposures  Most people have been exposed to these viruses but a persistent body burden/ load of these viruses detected in the stool test could indicate ongoing viral trigger for disease within or outside the gut.  TO DO: support gut health with diet, stress modification and some supplements   PARASITES  Protozoa/ Worms Absent   If present, could be food/water borne exposures. Can just be passing through or triggers for disease in and outside the intestines.  Worm testing added in Jan '18  TO DO:   Special botanical or Rx treatments for these may be indicated depending on your symptoms and history.         DIGESTION     ELASTASE   Normal > 200  Ideal may be double that Normal range  500 If low, suspected low production of pancreatic digestive juices, which can be an indication that the pancreas is not getting signals from stomach acid to pass the baton to the pancreas to do it's job (HYPOchlorhydria).   If you are taking any antacid medication (examples: omeprazole, or  ranitidine) this will be very important to address but do not quit Rx medications cold [place]. Discuss tapering dose with your clinician/s.    TO DO: healthy plant-food rich diet, stress modification since stress/cortisol reduces digestion, may need to supplement your meals with dietary aides with enzymes, betaine HCL and/or bile , in discussion with your clinician.   STEATOCRIT=   FAT LOSS INDEX  Normal < 15% Normal range   If elevated, suspect poor digestion of fats, possibly related to poor pancreas function, or maldigestion from reduced acid or inefficient bile production, as in cases of absent or diseased gall bladder or reduced gall bladder function.    TO DO: healthy plant-food rich diet, stress modification since stress/cortisol reduces digestion, may need to supplement your meals with dietary aides with enzymes, betaine HCL and/or bile , in discussion with your clinician.   IMMUNE DEFENSES     SECRETORY IgA (sIgA)  Normal >510 -2010 LOW'  208   If low, then first line intestinal defenses are depleted or insufficient for keeping undesirable bacteria/viruses/parasites in check; We may discuss supplementing the immune defenses with IgG and/or colostrum supplements.  If high, then intestine may be actively engaged in a defense process and we will explore what those triggers are.    TO DO: address causes for imbalances with attention to diet and some supplements such as probiotics, IgG (immuneglobulin) or colostrum, etc.   WHEAT/GLIADIN Antibodies to gliadin  (Special forces against wheat or similar proteins)  Normal 0-157   Normal range  75 If over the somewhat arbitrary number of 157, indicates intestinal defenses against wheat proteins/ gluten is on high alert;   If the number is normal here but the total of secretory IgA is below normal, suggests that the antibodies against gluten might be higher if the total sIgA were normal.    TO DO: eliminate wheat and keep other grains in diet low as well. If one is  already gluten zero, the marker could indicate exposure to other similar proteins, such as in oats/corn, etc  Discuss further with a functional medicine knowledgeable dietician.   INFLAMMATION     CALPROTECTIN  OLD BAR:  Normal < 50  Borderline 50-120    NEW BAR < 173 NORMAL   Normal range     A chemical made by White Blood Cells (Neutrophils). If elevated, a sign of inflammation, separates more benign Irritable bowel (IBS) from Inflammatory Bowel diseases (IBD) like colitis.  TO DO: Treating the underlying cause of the inflammation if from food or dysbiosis or infection. Antiinflammatory and gut healing supplements like probiotics, turmeric and/or L glutamine combinations are available. Discuss with clinician.     If elevated, we will want to retest this to make sure the number is normalizing. A colonoscopy may also be necessary to address high levels of calprotectin out of concern for damage to the colon lining.   BLOOD IN STOOL None If present, can be a marker for bleeding intestinal process or hemorrhoids   OTHER     B GLUCURONIDASE = TOX RECYCLER  Normal < 2486 Normal range   Made by some bacteria in the intestinal tract. If elevated, it also supports a concern for DYSBIOSIS, and when toxins tagged by liver/gut for removal, the toxins may be REabsorbed too much and this may overload liver detox abilities  TO DO: Probiotics may be indicated to help improve healthier balance of bacteria in the gut   ADD ON:  ZONULIN  Normal < 15-107   NA Elevation may correlate with increased intestinal permeability  I am no longer doing this test for most people since the cost of the test did not add significantly to the benefit of knowing the results.   ANTIBIOTIC RESISTANCE GENES FEW Present Tests for sensitivity to certain  Prescription antibiotic medications.  May help with choices of medications if antibiotics are ever needed in the future.  Present means your bugs are smart and may outsmart the antibiotic category of  meds   * Dysbiosis (also called dysbacteriosis) is a term for a microbial imbalance or maladaptation on or inside the body, such as an impaired microbiota. Dysbiosis is most commonly reported as a condition in the gastrointestinal tract, particularly during small intestinal bacterial overgrowth (SIBO) or fungal overgrowth (SIFO). It has been reported to be associated with illnesses, such as periodontal disease, inflammatory bowel disease, chronic fatigue syndrome, obesity, cancer,bacterial vaginosis, and various colitis.        ================    Follow Up:  No Follow-up on file.  to review tests as specified in AVS    Phyllis Ginger, MD  Integrative/Functional Medicine Consultant  [facility] Dept [facility], Adjunct Associate Professor  [facility]  [address]  [place], [place] [address]  [phone])      ===============================

## 2017-08-01 NOTE — Unmapped (Signed)
[facility]        FOLLOW UP PATIENT VISIT NOTATIONS                  For Emily Bonilla                               Clinician: Phyllis Ginger, MD    Chart notes (UC, and CareEverywhere), Problem List, Labs, Allergies and Medications are reviewed in preparation for current visit.    PCP: Court Joy, MD  Referred by: DR Arnetha Massy, PhD    -----------  HPI:  Emily Bonilla is a 57 y.o. who is here for her follow up consultation with [facility].    Patient's initial reasons for seeking consultation:      1. Mast cell activation syndrome (CMS Dx)     2. CVID (common variable immunodeficiency) (CMS Dx)     3. Multiple allergies     4. Anaphylaxis, subsequent encounter     5. Stress     6. B12 deficiency      ALREADY STARTED INJ B12, NOTING ENERGY DIFFERENCE, LESS IMM REACTIVITY, LESS BENEDRYL NEED   7. 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 Hypothyroidism due to Hashimoto's thyroiditis      RECENT DOSE INCREASE ON RX T4   9. Chronic diarrhea     10. Hyperparathyroid bone disease (CMS Dx)       No problems updated.  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 Dr Rogelio Seen( Allergist), Dr F Awan(hematology), Dr Leonard Downing( neurologist). ??Although, I would go to [facility] as needed for tweeks in my care.   IV fluids were very important to keep my body stable. ??It's amazing how good I feel after getting fluids. ??When my body stresses my Mast cells flair, I flush more and have increased diarrhea which is not helpful for fluid retention, increased inflammation, brain fog, extreme fatigue, BP drops with passing out( syncope) and until the last 2 years my lungs were very bad. ( I'm appreciative  that the last two years my lungs have been great!!), itching, VCD, MTD issues, and two more Life flight transports needed in 2009, 2011 both while on vacation out of state. I had severe reactions that could not be controlled by local ER so I was air vac to hospitals that knew how to treat Mast Cell Activation. ?? In 2007 Dr Carmon Ginsberg Awan(he is at [facility] now)Hematology was brought onto my care. ??   He evaluated me from top to bottom. ??Found that I would do better on IVIG, IV fluids, IV iron and magnesium. ?? B 12 levels fluctuated but never as low as they are now. ??I Always had issues with folic acid levels. But are ok now.   Back to Dr Nelle Don after my veins developed so much scar tissue and Emergency Response teams could not get IV in during emergencies and the IVIG replacement needed my first port was put in 2008. ??I had that port until 2013. A new port was put in on opposite side in 2014 due to sluggish Infusions. In 2014 My [facility]  [place] she retired) put me on Daily IV fluids1000 cc of LR, IV push benadryl q4-6 hours via my port. ??Steroids as needed. ??I was in a awful flair for several years. ??As hard as it was to keep up with the regiment ??I felt so much better and was able to enjoy life more, see friend and travel.. The summer of 2015 was when I kept having increased seizures and syncope in grocery stores produce dept( [employer] and bi lo) the seizures wer increasing big time. ??So the Allergist put me back on doxipen and steroids with my IV benadryl and IV fluids I got better. June 2017 broke legs after syncope from smelling perfum. Allergic reactions to O2 mask and antibiotics and   pain meds triggered symptoms and my face, lower lip, eyes swelled and then became infected. ??I was hospitalized 3 months for face surgery, left tibia fib multipal break and mast cell reaction. CVID was kicking in as my IgG levels were 319. My port was taken out and a pic line put in just incase my port compromised. ?? My port  came back clean from infection. ??After I was discharged from hospital to rehab nursing home to slowly ambulate as I had not been at bearing for 4 months. ??I stayed 3 weeks as they had cockroaches and could not take care of my dietary needs. ??I left and rehabbed myself. ??Since then I have had flairs related to allergy to hardware to left tibia fib and the transition off my IV fluids and trying to find a hospital that could treat me/not afraid of my conditions. ??That took 4 years!!!!!!!! ??     During all the medical issues I was going through a divorce Oct 2012.   That was rough transission as my drs of 14 years retired and/or left [employer] [place] after affordable health care act started to be implimented.   Lots of changes and I went to get psychotherapy weekly to bi weekly for the next 4 years. ??This was very important to my over all health. My ex and I went to marriage counciling 4 years weekly to deal with the stressors of my illness on our marriage before the divorce.     -------------------------------------  ROS/ SYMPTOM TRACKER (IFM MATRIX REVISED)   X= Moderate risk domains; XX= Higher risk domains  O= Lower risk domains  CC (pt's own goals):  Identifying possible triggers of inflammation   Inflammation,   Pain   Anaphylaxis       Patient care team:  Patient Care Team:  Court Joy, MD as PCP - General (Internal Medicine)  Christena Flake, DO as Consulting Physician (Allergy and Immunology)     08/01/2017   06/27/2017    First visit: 06/09/2017       LABS    X  Several abnl metabolic markers     [facility] STAFF CONTACT,      There is no immunization history on file for this patient.    Lab Results   Component Value Date    HMDIABEYEEX Normal DM eye exam/Dr Klemencic/[facility] 07/28/2017           REVIEWED RECENT UC LABS    >>GIMAP IF [facility] WILL COVER  >>B12 INJ  >>HEM ONC EVAL FOR ASSIST WITH MGMT/ PORT CareEverywhere reviewed:   EXAM      Several exam findings         HERE ALONE  ATTENDANT IN LOBBY       MSQ  (  Medical Symptom Questionnaire)  Subjective assessment of symptoms, ideally reduced during course of therapy    124  129 152  MSQ > 30         From Functional Med Matrix        SOCIAL /MENTAL:   X  Several historic or current challenges to emotional wellbeing, and/or psychological balance    Including hx ACE, emotional traumas, safety issues, anxiety, depression, etc       COMPLEX HEALTH STORY  ANXIETY  MAST CELL ISSUES    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, [employer], [place]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    RAISED BY  MOM/ STEP DAD IN [place]    HX OF WORK AS ICU NURSE    COGNITIVE IMPAIRMENTS WITH ANAPHYLAXIS    G1P1 (DAU '85)  GDAU '17 SICK BABY    WT UP 70# IN PAST 8 MONTHS (CAN ONLY ACCOUNT FOR 50# FROM AUG'18 IN RECORD)     HAS EMERGENCY GMS MONITOR, PERSONAL ON STAR  ALSO HAS AIDE TO ALL APPTS    HAS MOVED FOR HEALTH CARE MANY TIMES OVER PAST 7 YRS     ASSIMILATION/ Digestion:   X  GI / Digestion & Absorption issues       Issues considered include history/current use of NSAID, PPI, H2Bs  increasing intestinal permeability;  Surgical interventions such as Cholecystectomy, Appendectomy, partial or more extensive regional intestinal resections,compromising digestive resilience,  Diagnoses of GERD/HH/ IBS vs IBS    HX CHOL'Y '15  ?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  IgG food testing:  No results found for: AIGGCASEIN, AIGGWHEAT, AIGGOAT, AIGGMAIZCORN, AIGGPEANUT, AIGGSOYBEAN, AIGGEGGWHOLE    FOOD SENS TESTING IN '98 ([facility])  FINDING PRESERVATIVE/REACTIVITIES    GIMAP/PCR stool testing:  June '19, PE 500, sIgA 208, +STAPH,       BLOATING UP AFTER 10 MIN IN NEW BUILDING   ONGOING CHR DIARRHEA    >>GIMAP   BM 15X THIS AM,  2 KINDS OF DIARRHEA SINCE CHOL'Y    Ulcers;Colitis;Gallbladder Disease;Diarrhea;Gastroenteritis (nausea and vomiting)    HX CHOL'Y '15  ?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    ABD BLOATING  WT GAIN, 5#s UP AND DOWN,    Mag:   Magnesium   Date Value Ref Range Status   06/09/2017 2.0 1.5 - 2.5 mg/dL  Final     No results found for: MAGNESIUMRB  OPTIMAL: Serum mag >2.1; RBC mag >6     DIET/ >>>>>>>>>>>>>>>>>>>  NUTRITIONAL BACKGROUND EATS MORE CHICKEN THAN RED MEAT    REACTIVE TO CHICKEN  >>LIMIT GLUTEN FURTHER  >>? AVOID CHICKEN    OFF RICE, SOY  LIMITS GLUTEN,   MAKES OWN BREAD  EGGS SEEM OK       DEFENSE/ REPAIR/ Immune-Inflamm:  XX  Risks for infections or inflam/   autoimmune issues    Issues considered include chr/recurrent NSAID use, recurrent illnesses and/or repetitive antibiotic use, needs for chronic or intermittent topical/oral/ inhaled/ injectable streoids; personal and family history of Inflammatory and/or Autoimmune disorders,        OUTBREAK OF POISON OAK FROM SOME YARD WORK    MAST CELL ISSUES   > DR LATIF 8/8  FOR MGMT, THEN PORT TO BE DISCUSSED    18 MONTHS W/O IVIG,  AFTER 9 YEARS IVIG    CATARACT, R EYE POOR VISION > SURG IN AUGUST    Lab Results   Component Value Date    WBC 12.6 (H) 05/31/2017   WBC:  %N: 59.7  %  L: 27.7  %M: 11.1  %E: 1.1  %B: 0.4    STILL HIGH SINUS CONG, DAILY SINUS LAVAGE    SOME REACTIVITY TO OREGANO SO LIKELY UNSAFE FOR OREGANO MISTS   NEEDS FLUID FOR MASTOCYTOSIS  >>DR SYED AHMAD FOR ?PORT    HIGH CRP  Lab Results   Component Value Date    CRP 17.8 (H) 06/09/2017      HX:  5+ ABX PAST 5 YRS   10+ IN LIFE   Pneumonia, sinus infection, skin infections  BITTEN BADLY BY RED ANTS AGE 77,    5+ STEROID COURSES   Mast Cell Activation Syndrome,     MAST CELL DEGRANULATION ISSUES  (WALTER REED) DX '96 (DR HARBUT)  FAM HX MAST CELL ISSUES (MAT AUNT D.58, MAT UNCLE, MAT GF D. 49)  VOCAL CORD DYSKINESIA,  S/P HELIOX TX,     ANAPHYLAXIS > LIP ANGIOEDEMA > SURGERY TO REPAIR    MONTHLY IVIG, SINCE '08 -'14  BEST IgG 700s  '14-'17 PATCHWORK DOCS WITH HYZINTRA  '18-PRESENT HAS A TEAM AT UC  OFF IVIG PAST 2 YRS AND AND WT HAS GONE UP FROM 89# 2 YRS AGO    BOTOX Q3 MO FOR PAIN MGMT      Lab Results   Component Value Date    WBC 12.6 (H) 05/31/2017   WBC:  %N: 59.7  %L: 27.7  %M:  11.1  %E: 1.1  %B: 0.4    No results found for: HSCRP,   Lab Results   Component Value Date/Time    CRP 17.8 (H) 06/09/2017 1549     OPTIMAL: CRP<1  Lab Results   Component Value Date/Time    ESR 26 06/09/2017 1549    ESR 29 05/31/2017 1118     No results found for: SEDRATE  No results found for: ANA  No results found for: RF     ENERGY/ mitochondria:   X  Cellular Energy compromises    Issues considered include conditions potentially causative of mitochondrial dysfunction,  compromise of Krebs/ electron transport, ATP production and oxidative stress  Factors including chronic /recurrent cephalgia, central and/or peripheral neurologic deficits, balance issues; nutrient deficiences such as cofactors for ETC: B2, B6, B12, folate, CoQ10, iron or diets deficient in antioxidants     STARTED VITAMIN D2 RX AFTER POISON OAK > WORSE HIVING  BUT > D3  SOME ITCHING,     SAW ENT / DR Vonita Moss > THYROID INFLAMED,  DID NOT SEE ANY HYPERPARATHYROID TUMORS > CONT VIT D/ CALCIUM MGMT AND FOLLOW PTHs    USING ONE HALF ML mB12 =1000MCG TIW INJ,   > MORE ENERGY, LESS FRUSTRATION     B12 VERY DEFICIENT BUT CONCERN FOR ANAPHYLAXIS AND RECOMMEND A NON-PRESERVATIVE PRODCT  >>METHYL B12 COMPOUNDED FROM TRISTATE  >>MONITOR W/ NURSE FOR FIRST INJECTION    HAS BENEFITTED IN PAST REPORTEDLY FROM IRON INFUSIONS, ONLY CLUE WOULD BE LOW END MCV.Marland Kitchen  GIVEN HIGHER POTENTIAL FOR ANAPHYLAXIS, DEFER IRON OPTIONS TO HEMONC      SEIZURES   LOC    SZ IN GROCERY STORE/S > HOME DELIVERY    Lab Results   Component Value Date    HGB 14.8 05/31/2017    HCT 45.2 (H) 05/31/2017    MCV 84.9 05/31/2017    PLT 317 05/31/2017       Lab Results   Component Value Date    VITAMINB12 163 (L) 06/09/2017     Lab Results   Component Value Date  FERRITIN 46.0 06/09/2017     Homocysteine   Lab Results   Component Value Date    HOMOCYSTEINE 8.8 06/09/2017   ,(Hcy OPTIMAL <7):     MMA :  (OPTIMAL <150)  CoQ10 :  (OPTIMAL >0.86)     COMMUNICATION/ hormones:  XX  Hormonal  concerns  Evidence of cellular communication compromise,    Issues considered including HPA Axis imbalance, autonomic dysfunction, glycemic dysregulation, centripetal adiposity, thyroid and/or gonadal dysfunction     SEEN BY ENDO > INCREASED THYROID DOSE TO 125, PAST MONTH    ENT >THYROID US > RAGING WITH AUTOIMMUNITY      ADDED METFORMIN BID  > WT LOSS  Lab Results   Component Value Date    HGBA1C 7.3 (H) 06/09/2017         BROTHER BRITTLE DIABETIC (DM2)  MOTHER THYROID CANCER, SEV OTHER RELATIVES,    HX DUB, > HYST '09  HX SHOCK/ COLLAPSE DAY 1-2 OF PERIODS    THYROID INFLAMES  ON RX T4 110  Lab Results   Component Value Date    TSH 6.11 (H) 06/09/2017    T3TOTAL 98.0 06/09/2017    FREET4 0.81 06/09/2017    THYROIDAB 747.0 (H) 06/09/2017     Lab Results   Component Value Date/Time    T3REV 14.8 06/09/2017 1549     TT3/ RT3: __  (TT3/ RT3 OPTIMAL 6-10)  Lab Results   Component Value Date    HGBA1C 7.3 (H) 06/09/2017   (OPTIMAL <5.3)     ELIMINATION/ detox:   XX  Phase I/ II Detox pathway, GI or renal elimination risks    Issues considered include poor phytonutrient provision and hydration, gastrointestinal dysfunction, slow transit compromising first line detox as well as hepatic overload from environmental exposures, tobacco 1st-3rd hand, EtOH, hobbies, dental amalgams, and genomic uniqueness such as SNPs for COMT/SOD, etc     Lab Results   Component Value Date    ALT 14 10/28/2016    AST 16 10/28/2016    GGT 53 06/09/2017    ALKPHOS 110 10/28/2016    BILITOT 0.3 10/28/2016      .  MULTI CHEM SENSITIVITIES    Lab Results   Component Value Date    GGT 53 06/09/2017   GGT (OPTIMAL < 30)    >>L-GLUTATHIONE/ NAC OPTIONS      MULTIPLE CHEM SENSITIVITY  USES ION PERSONAL CARE PRODUCTS ,    Lab Results   Component Value Date    ALT 14 10/28/2016    AST 16 10/28/2016    GGT 53 06/09/2017    ALKPHOS 110 10/28/2016    BILITOT 0.3 10/28/2016     GGT (OPTIMAL < 30)  URIC ACID No results found for: URICACID  Optimal < 6    STRUCTURAL INTEGRITY:  X  Structural issues     Issues considered include birth traumas, spinal segment compromises, MVA, fractures, repetitive motion injuries and persistent ergonomic stressors      DENTAL SURGERIES    R TIB FRX > TITANIUM, REACTIVE. AND TOOK OUT TITANIUM 10 MO AGO Lab Results   Component Value Date    VITD25H 10.5 (L) 06/09/2017   STARTED D  BUT HAD TO DC TEMPORARILY D/T INCREASED HIVES      DENTAL SURGERIES    R TIB FRX > TITANIUM, REACTIVE. AND TOOK OUT TITANIUM 10 MO AGO    HX PORTS  PLACED POST OP LIP ANAPHYLAXIS    Lab Results   Component  Value Date    VITD25H 10.5 (L) 06/09/2017     (OPTIMAL 50-60)  PTH:   Lab Results   Component Value Date    PTH 151.0 (H) 06/09/2017    CALCIUM 9.4 05/31/2017   Optimal PTH <30     TRANSPORT / CV:  X  Circulatory concerns    Issues weighed include evidence of vascular compromise, inflammatory contributors to endothelial dysfunction; hypertension, coagulation defects, anticoagulation, medications causing dysregulation of vascular stability   Vitals:    08/01/17 0822   BP: 122/78   Pulse: 80   SpO2: 97%     Wt Readings from Last 3 Encounters:   08/01/17 215 lb 3.2 oz (97.6 kg)   06/29/17 (!) 222 lb 4.8 oz (100.8 kg)   06/29/17 (P) 222 lb (100.7 kg)         Vitals:    06/27/17 1522   BP: 138/82   Pulse: 84   SpO2: 96%       Wt Readings from Last 3 Encounters:   06/29/17 (!) 222 lb 4.8 oz (100.8 kg)   06/29/17 (P) 222 lb (100.7 kg)   06/27/17 221 lb 3.2 oz (100.3 kg)        HX:   WPW  WT GAIN    RX:   Vitals:    06/09/17 1348   BP: 132/68   Pulse: 87   SpO2: 97%     Wt Readings from Last 3 Encounters:   06/09/17 218 lb 9.6 oz (99.2 kg)   06/07/17 217 lb (98.4 kg)   06/02/17 217 lb 3.2 oz (98.5 kg)   WT 165  8/'18     8  of 8 Risk Factor Domains                            THERAPEUTIC LIFESTYLE CHANGE (TLC) TRACKER      08/01/2017    06/27/2017    Initial visit: 06/09/2017        CHANGE READINESS:  Precontemplation, contemplation, preparation, action, and  maintenance ACTION PREP PREP   MOVEMENT  Assessing for compromises including sedentary lifestyle or work, impaired mobility, or energy    MOVEMENT:      1         2       3         4        5        X     low risk     >>>>>>  higher risk       SLEEP  Assessing compromises including difficulty with sleep onset, maintenance or quality, or quantity <7 hours routinely, shift work, dark/light reversal    EPWORTH:  No flowsheet data found.     SLEEP:     1         2       3         4        5       X     low risk     >>>>>>  higher risk    NEWER PUFFING WITH SLEEP SINCE WT GAIN  SUMMER '18     RELATIONSHIPS & SUPPORT NETWORK:  Assessing support for emotional health as well as initiating and maintaining TLCs     RELATING:      1         2  3         4        5       X     low risk     >>>>>>  higher risk    CHURCH  DAU  BABY GRANDDAU     EMOTION INVENTORY:  Assessing concerns for historical or ongoing physical or emotional safety, poor self efficacy or esteem, labile mood or frequent anxiety, dysthymia     EMOTIONS:        1         2       3         4        5       X     low risk     >>>>>>   higher risk   TOX/DETOX  Assessing concerns for former or current tobacco or excessive  EtOH, or substance exposures; occupational or hobby related xenobiotic or VOC*/ heavy metal exposures:  Chemotherapeutic agents; Rx polypharmacy      *VOC: volatile organic compounds/ or Hazardous materials  Resources cited: [email or web address]   DETOX:     1         2       3         4        5            X   low risk     >>>>>> higher risk   RESTORATIVE PRACTICES:  Assessing concerns for sufficiency of self care practices, creation of quiet, implementation of mindfulness, restorative breathing practices, touch, disconnection from [place], etc       MBSR      1         2       3         4        5        X     low risk     >>>>>    higher risk    STARTED WITH MEDITATION IN '98,   AWAKENS QUIET,  AM MEDITATION NOW  HAS APPS ON PHONE, INSIGHT TIMER  Tend  flowers, meditation, watch [employer] station, play memory games   [place], grandchildren, flowers, nature, travel, family.   >>MBSR TOOLS, Rx   DENTAL  Assessing  concern for hygienic, surgical, or ongoing ORAL Inflammatory/ Infectious processes       DENTAL:      1         2       3         4        5           X    low risk     >>>>>    higher risk    Grinding;TMJ;Gum Disease;Root Canal/s   All done from 2004-2012.  April 2019 oral surgery to remove a decayed tool part of a 3pc bridge. Went to OR and intubated and over night in hospital.      HYDRATION:   Assessing sufficiency of water intake, through free water and/or food fluid    >>Advised on hydrating to point of light yellow urine   H2O:     1         2       3         4        5       X  low risk     >>>>>    higher risk  Currently at  - - oz water daily    10 X 16 OZ WATERS DAILY,  SO DRY,  MISSING IVF  PER PORT    DESIRES:  IVF  FLUIDS LR 500 CC/NIGHT >  X 30D THEN 1000 CC Q 3D     DIET/ NUTRIENTS:  Assessing phytonutrient intake levels, individual's of awareness and implementation of food as medicine, their use of whole foods, phytonutrient diversity, avoidance of high refined sugar/ processed foods     HIGHLY CAUTIOUS OF MOLDS IN DIET,  ORAL LESIONS FROM CHEESE?    LIMITS GLUTEN  LIMITS RICE    USES CANOLA,  INTOL OF OLIVE    DOES EAT SOME HAMBURGER    DIET:       1         2       3         4        5        X     low risk     >>>>>    higher risk    OFF RICE, SOY  LIMITS GLUTEN,   MAKES OWN BREAD  EGGS SEEM OK   SUPPLEMENTS:                                        FS    HIGHLY PRESERVATIVE SENSITIVE!!     RX  DOXEPIN 10MG  26MG  (  ZYRTEC UP TO 6/D  PPI OR PEPCID  BENEDRYL 150 MG/D  SINGULAIRE  XOPENEX  ALLEGRA  BUSPAR    T4 125 past few weeks  mB12 1K TIW >> EVERY OTHER DAY    Recent addition of...  METFORMIN  (flushed x 2 wks, not much D)    >>SBI PROTECT (immunoglobulin) 1>4/ day     RX   T4 112  DOXEPIN 10MG  26MG  (  ZYRTEC UP TO 6/D  PPI OR  PEPCID    BENEDRYL 150 MG/D  SINGULAIRE  XOPENEX  ALLEGRA  BUSPAR    >>mB12 TRISTATE  >>IVF/ HEM ONC REF  >>L-GLUTATHIONE VS NAC RX   T4 112  DOXEPIN 10MG  26MG  (  ZYRTEC UP TO 6/D  PPI OR PEPCID  BENEDRYL 150 MG/D  SINGULAIRE  XOPENEX  ALLEGRA  BUSPAR    NO IVF   USED TO GET 500>1200 ML    SUPP:  OFF  TRIED MANY PROBIOTICS         KEY:  >>: POC;  Sx/RISKS 1  LOW < <--5  HIGH   C: CONTINUE  S: STABLE  R!: RESOLVED!  P: PERSISTENT  E: EPISODIC  W: WORSE  B: BETTER/IMPROVED  MBSR: MINDFULNESS BASED STRESS REDUCTION   ZO:[id number] ENZYMES  VOC: VOLATILE ORGANIC POLLUTANTS  ---------------------  INTEGRATIVE MED FLOW SHEET  New Patient Md Consult Questionnaire 05/12/2017   Which level of study have you completed? Two-year college   Other Source: Meditation intergrative med dr refered me   Overall health? Fair   Goals? Identifying possible triggers of inflamation   1st (of 3) Top Health Concern: Inflammation,   2nd Top Health Concern: Pain   3rd Top Health Concern: Anaphylaxis   Maximum Height: 5'7   Maximum Weight: 212   Minimum Adult Weight: 89   Weight Recently Up/Down: Up   Courses of antibiotics in the  past 10 yrs: 5+   Courses of antibiotics lifetime: 10+   Type of infections: Pneumonia, sinus infection, skin infections   Steroid/Prednisone courses (oral/injection) past 10 yrs: 5+   Type of conditions: Mast Cell Activation Syndrome,   # of current prescriptions? 5 or more   Multivitamins? No   Individual Vitamins/Minerals? No   Digestive Aides? No   Energy Supplements? No   Liver Support Supplements? No   Anti-Inflammation/Pain? No   Mood/Sleep Supplements? No   Allergy/Cold/Sinus? Yes   Antihistamines (Claritin,Zyrtec) Yes   Brand: Certirizine, Gluten free Members Marks( SAMS BRAND)   Dose - Frequency 1 @night .  I can increase up 6 a day during severe flairs   Used Since 15+ years   Nasal Sprays No   Mucinex (Guaifenisin) No   Herbal - Elderberry No   Herbal - Echinacea No   Allergy/Cold/Sinus Other Yes   Brand:  Benadryl, Members Marks (sams)   Dose - Frequency 150 mg a day. Dose changes fro 25 mg to 300mg  daily   Used Since 25 years +   Herbs/Tinctures/Essential Oils No   Physical therapy? Yes   Helpful? Yes   Currently use? No   How often? When I broke legs therapy. I rehab myself after Rehab center was inadequate    Talk therapy/counseling? Yes   Helpful? Yes   Currently use? Yes   How often? Dr Verna Czech, Meditation works best for me.   Who/where? Dr Dan Humphreys, [facility].  Meditation works best for me.   Massage therapy? Yes   Helpful? Yes   Currently use? No   How often? Insurance does not [employer].   Who/where? [facility]ap[facility]   Yoga? Yes   Helpful? Yes   Currently use? Yes   How often? Stretching after dinner   Who/where? At home alone.    Chiropractic therapy? No   Helpful? No   Currently use? No   How often? N/A   Acupuncture? Yes   Helpful? Yes   Currently use? No   How often? Need insurance to cover.   Meditation? Yes   Helpful? Yes   Currently use? Yes   How often? Nightly, and during high stress levels   Who/where? Dr Clarene Critchley Deborah Chalk   Tai Chi/Qigong? Yes   Helpful? Yes   Currently use? No   How often? [facility] [employer] started in 1998   Who/where? None    Other Integrative Healthcare Providers: I was getting massage therapy but I pay out of pocket.  Not in budget.    Regular dental exams? No   Last visit: March 2019   Dental work: Grinding;TMJ;Gum Disease;Root Canal/s   When? All done from 2004-2012.  April 2019 oral surgery to remove a decayed tool part of a 3pc bridge. Went to OR and intubated and over night in hospital.   Cavities/fillings? Yes   How many? 10   Type Gold or Silver/Mercury Fillings;Porcelain Fillings   Brushing frequency: 2 times a day   Flossing frequency: 3 times a week.  Friction was triggering anaphylaxis    Floss with? Floss;Waterpik   Outside of the [place] Yes   Wilderness camping? Yes   Where? [facility]    Sexual  health/function concerns: No   AVG hrs of sleep per night? 6-8   Avg # of times awaken per night? 1-2   Response to waking up? Depending on my pain level.  I do get deep sleep.  But when pain is bad I have sleep studies that show alpha wave intrusion and hundreds of wake up.  My snoring has stopped on antihistamine    Read before falling asleep? No   Watch TV before falling asleep? Yes   TV in the bedroom? No   Work on computer/laptop/smartphone before falling asleep or during the night? No   Taken medications in past for insomnia? Yes   Type: Ambien,   had adverse reactions to it.    Currently take sleep aides? No   Awake feeling rested? Yes   # of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 onabotulinumtoxinA (cosmetic) Inject into the muscle.   ??? predniSONE TAKE ONE TABLET BY MOUTH DAILY.   ??? ranitidine Take 1 tablet (300 mg total) by mouth 3 times a day.   ??? triamcinolone APPLY 2 TIMES DAILY   ??? UNABLE TO FIND COMPOUNDED METHYLCOBALAMIN 1000 MCG EVERY OTHER DAY BY INJECTION/ TRISTATE     No current facility-administered medications for this visit.      ------  Recent labs:   Reviewed from [facility] EPIC and/or Care Everywhere  Some below may be resulted after visit.  Metab:  A1c  Lab Results   Component Value Date    HGBA1C 7.3 (H) 06/09/2017     Fasting insulin: .    Energy:  H/H:   Lab Results   Component Value Date    WBC 12.6 (H) 05/31/2017    HGB 14.8 05/31/2017    HCT 45.2 (H) 05/31/2017    MCV 84.9 05/31/2017    PLT 317 05/31/2017     Iron/Ferritin:   Lab Results   Component Value Date    IRON 73  06/09/2017    TIBC 375 06/09/2017    FERRITIN 46.0 06/09/2017     Vit B12:   Lab Results   Component Value Date    VITAMINB12 163 (L) 06/09/2017     Homocysteine:   Lab Results   Component Value Date    HOMOCYSTEINE 8.8 06/09/2017   ,  MMA:@    Mag:   Magnesium   Date Value Ref Range Status   06/09/2017 2.0 1.5 - 2.5 mg/dL Final     No results found for: MAGNESIUMRB  Vit D:   Lab Results   Component Value Date    VITD25H 10.5 (L) 06/09/2017      -----------  Detox:    Lab Results   Component Value Date    GGT 53 06/09/2017     Lab Results   Component Value Date    ALT 14 10/28/2016     Lab Results   Component Value Date    AST 16 10/28/2016  Lab Results   Component Value Date    CREATININE 0.84 05/31/2017     ------------  Thyroid:  Lab Results   Component Value Date    TSH 6.11 (H) 06/09/2017    T3TOTAL 98.0 06/09/2017    FREET4 0.81 06/09/2017    THYROIDAB 747.0 (H) 06/09/2017     No results found for: THGAB  @  ---------  Inflammation:  No results found for: SEDRATE  Lab Results   Component Value Date    CRP 17.8 (H) 06/09/2017   , or   HS CRP No results found for: HSCRP,   No results found for: ANA  No results found for: RF      EXAM:  Vitals:    08/01/17 0822   BP: 122/78   BP Location: Right arm   Patient Position: Sitting   BP Cuff Size: Regular   Pulse: 80   SpO2: 97%   Weight: 215 lb 3.2 oz (97.6 kg)   Height: 5' 7 (1.702 m)     Body mass index is 33.71 kg/m??.   Wt Readings from Last 3 Encounters:   08/01/17 215 lb 3.2 oz (97.6 kg)   06/29/17 (!) 222 lb 4.8 oz (100.8 kg)   06/29/17 (P) 222 lb (100.7 kg)       ==================================  Assessment/ Plan:  See Pt instructions for additional information  I spent___90+__min with this patient in face to face contact, reviewing history with > 90% of time spent in education, plan of care and counselling     1. Mast cell activation syndrome (CMS Dx)     2. CVID (common variable immunodeficiency) (CMS Dx)     3. Multiple allergies     4. Anaphylaxis,  subsequent encounter     5. Stress     6. B12 deficiency      ALREADY STARTED INJ B12, NOTING ENERGY DIFFERENCE, LESS IMM REACTIVITY, LESS BENEDRYL NEED   7. 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 Hypothyroidism due to Hashimoto's thyroiditis      RECENT DOSE INCREASE ON RX T4   9. 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   Patient Instructions     INTEGRATIVE MEDICINE CHECK LIST   Please refer to the following outlines to verify the wellness advice and further testing we discussed.      TO DO LIST:  []       []    LAB ORDERS PLACED:  If at the end of your visit, please use our [place] office, 1st floor;   if later and/or fasting, you may use any UC lab site preferred.  UC drawn labs will be communicated via email.   If you have blood drawn at a non UC site, we will NOT be able to see those results and may only be able to review them with you at a visit.     Look for a results letter in your UC MyChart message basket 5-7 days after your results are completely back.  >>WILL WANT TO SEE A VITAMIN D LEVEL IN 3 MONTHS OR SO  ALONG WITH PTH.  LET ME KNOW WHEN OTHERS ARE GETTING LABS TO SEE IF I CAN PIGGY BACK ON THEIR DRAW.     DON'T MIND NOT RETESTING THE B12 LEVEL BUT WOULD LIKE TO SEE THE MMA (Methylmalonic acid) AGAIN   []    GIMAP STOOL TEST KIT,  (results not visible in UC MyChart)      PLEASE MAKE APPT FOR 3-4 WEEKS ASAP AFTER SENDING IN KIT OR EVEN BEFORE YOU SEND IT IN ; DON'T WAIT FOR [place] TO LET YOU KNOW THE TEST IS BACK, IT WILL NOT SHOW UP IN YOUR MYCHART FILE  >>   []    NEUROLAB [facility]) Neurotransmitter testing along with  salivary cortisol/DHEA testing:  We provide the kit, you complete at home and send to [facility], results outside UC, 2-3 weeks turn around time.Marland Kitchen  PLEASE MAKE APPT FOR 3-4 WEEKS ASAP AFTER SENDING IN KIT OR EVEN BEFORE YOU SEND IT IN ; DON'T WAIT FOR [place] TO LET YOU KNOW THE TEST IS BACK, IT WILL NOT SHOW UP IN YOUR MYCHART FILE  >>     []    GENOVA KIT (results not visible in UC MyChart)   MAKE AN APPOINTMENT FOR THE TIMING LISTED (TAT)       >>   [x]    MIND BODY:  MAKE TIME FOR QUIET BREATHING, DISCONNECT FROM PHONE/TEXT/COMPUTER, SPEND TIME IN NATURE, GRATITUDE JOURNAL'  >>  >> 8 week MINDFULNESS TRAINING COURSES, CHECK WEBSITE FOR LATEST CLASS SCHEDULE      [x]    GUT HEALTH:  EAT REAL FOOD, NOT TOO MUCH, MOSTLY PLANTS!  >>FOR IMMUNE HEALTH, LET'S TRY ADDING SBI PROTECT IMMUNOGLOBULIN FOR SEVERAL MONTHS   [x]    IMMUNE/ ANTI- INFLAMMATION SUPPORT:  TO HELP YOUR BODY DEFEND ITSELF, LIMIT SUGAR TO HELP STRESS SIGNALS, SLEEP TO HELP BRAIN REPAIR,  >>RETRIAL VITAMIN D (D3 + K2) AS TOLERATED, ONCE A WEEK  >>SBI PROTECT (IMMUNOGLOBULIN) 1 TO 4 CAPS DAILY WORK UP SLOWLY.      [x]    HORMONE SUPPORT (ADRENAL, THYROID, INSULIN, GENDER):  SUGAR CAN CAUSE COMMUNICATION IMBALANCES, VITAMIN D IS A HORMONE WE MAKE FROM THE SUN!  >>GLAD ABOUT THE HIGHER DOSE OF THYROID T4,  VITAMIN D IS ALSO A HORMONE.!   [x]    ENERGY REGULATION:  NUTRIENTS SUCH AS VITAMIN B2, B12, IRON AND OXYGEN ARE ESSENTIAL FOR MITOCHONDRIA (YOUR BATTERIES)  >>LET'S INCREASE B12 TO EVERY OTHER DAY INJECTIONS.      [x]    DETOX SUPPORT:  HEALTHY GUT TRANSIT IS THE BEST DETOX! IF THE GUT ISN'T ABLE TO DO IT'S JOB, THE LIVER WILL HAVE TO TAKE UP THE SLACK!  >>     [x]    TRANSPORT/ STRUCTURE:  GETTING NUTRIENTS THROUGHOUT THE BODY AND HOW WE MOVE THROUGH SPACE ALL CONTRIBUTE IMMENSELY TO OVERALL HEALTH. ...CONSIDER YOUR SPINE AND JOINTS AS STARS IN A CONSTELLATION. MAKE AS MANY CONSTELLATIONS THROUGH YOUR DAY AS POSSIBLE NOT JUST A STATIC FEW POSITIONS!  >>     []     DIETICIAN VISIT : [facility] is no longer employing a dietitian,  Refer to   [facility], RD  [email or web address]  [phone]  Or  Jarrett Ables, RD  [email or web address]  [phone]      []    BEHAVIORAL HEALTH/ COUNSELOR VISIT Arnetha Massy, PhD, many insurances may cover; please check with your carrier) ; Dr Dan Humphreys can see you for 1-5 visits depending on your needs but is not a long-term-needs counselor; her specialty is MBSR (Mindfulness-based stress reduction)  and performance improvement.    SEE COURSE SCHEDULE FOR SMALL GROUP MINDFULNESS/ ANXIETY TREATMENT GROUPS  [email or web address]       []    REFERRALS OR OTHER ORDERS:    (You may not need an actual referral to access these services)  Integrative services like Massage, Acupuncture  Get costs and schedule at our front desk or by calling [phone]. Special arrangements may be negotiated.    Call [phone] for UC imaging orders or   >>   [x]    FULLSCRIPT (on-line supplement dispensary)   If an order initiated during your visit-- check your personal email (or spam folder)  Dr Valeda Malm may wait until lab results are seen to complete an order, please let us know if that doesn't happen and you were waiting for it.  If you don't have a Fullscript link, please let Dr Valeda Malm know to set that up for you.    SUPPLEMENT CHANGES OR ADDITIONS:    See above or....  >>SBI PROTECT EXPERIMENT, IN LIEU OF IVIG FOR NOW!   []    Rx / PRESCRIPTION written for:  >>        [x]  GROUP CLASSES  INCLUDING MINDFULNESS TRAINING  [facility] ([facility], [place])    CHECK OUR WEBSITE FOR CLASS OFFERINGS AND COSTS.  [email or web address]      [x]      FOLLOW UP VISIT    Please make appointment to go over any lab work, don't wait for a reminder from [place], especially for nonUC tests that you will not see in MyChart.  For each follow up appointment, we will be asking you to  complete one or two electronic questionnaires before your visit so please check your UCMyChart in the days prior to your next visit and complete these important health status forms.   These help [place] partner with you for your health more efficienctly  Please bring your Integrative Medicine folder, all prior handouts and test reports as well as updated lists of medications and supplements to each visit, since electronic access to your data is not as easy as it should be!      -----------------------------------------  Please review above recommendations and TLC (Therapeutic Lifestyle Changes).   We advise gradual changes, staggered entry of new components in order to determine pros/cons of various interventions.   It took time to get out of balance and restoring balance may be a long, worthwhile journey!  We look forward to partnering with you for your wellness.      Be well,  Phyllis Ginger, MD  UC Dept [facility], Adjunct Associate Professor  Integrative/Functional Medicine Consultant     [facility]  [facility]  [address]  [address]  [place], [place] [address]  [phone])  Fax: [phone]  (clinic hours Mon ALL Outlook, Tues PM,  Wednesday AM, Thurs ALL DAY, Fri out of office     Integrative Medicine Program Director: Ester Rink, PhD  Integrative Medicine Clinical Director: Ancil Linsey  Patient Services Representative:    Chief Medical Assistant:  Ms. Ciro Backer  EXTENSION: [phone]  [x]      GIMAP STOOL TEST RESULTS  Date specimen was collected: 07/05/17    Please see more detailed information about this test at the DSL website [email or web address]. There is an excellent video link to explain the sections of the test as well as access to the printable white paper by the lab.   Notes   If abnormal:  ECOLOGY     ACUTE PATHOGENS   None   Any of these are potential triggers for significant diarrheal illnesses, some bacterial, viral or  parasitic verified as such by the  [facility] [employer] for Disease Control, [email or web address]). Some may be present without causing diarrhea and may be associated with non-diarrheal conditions. Others may just be captured passing through and not cause disease.  TO DO:  Address relevance to your condition with the clinician, may need Rx or supplement to combat these   H.P..=  Helicobacter Pylori  (gastric germ) Absent   A bacteria present more commonly in stomach and upper GI tract, can cause or relate to gastritis, stomach ulcers and stomach cancer risk.  If present without symptoms, can still be important due to ability to reduce stomach acid and affect gut defenses and absorption of many nutrients like iron and B12, etc.    Presence or absence of additional HP genetic markers called Virulence Factors can increase the risk of HPylori infections  TO DO: clinician MAY propose options to treat this and having an account at Fullscript will be suggested to help with explaining options   NORMAL BACTERIA Normal ranges  SL Imbalanced If some are low end or below normal, this could be because of antibiotic use, poor dietary exposures.  Imbalances in good bacterial ratios can also be important.  Too much of certain categories and too few of other can be DYSBIOSIS * or imbalanced ecology. It is believed to be healthier to have more Bacteroides families relative to Firmicutes families. F > B may also have impact on obesity and ability to lose weight    TO DO: a healthy plant-food RICH diet can help tremendously, avoiding antibiotics and supplementing diet with fermented foods and supplements need to be discussed   POTENTIAL HARMFUL BACTERIA  (Opportunistic) STAPH Present If one or several are in high number, this could be contribute to DYSBIOSIS, germs taking advantage of inadequate defenses by good bacteria, leading to potential for inflammation via the production of chemicals called LPS (Lipopolysaccharides); Several are known to correlate  with some autoimmune conditions either through the LPS or molecular mimicry, genetic similarity to our own tissues.    TO DO: We will want to discuss correcting imbalances in helpful and harmful bacteria, either through probiotic rich diets, supplements and/or weeding out the potentially harmful germs through Rx or supplements.   YEASTS/ FUNGUS Absent   If present, could be from food exposures,  repeated exposures to antibiotics, excesses of dietary simple sugars, and/or insufficient good bacteria to compete out the fungi.    TO DO:  If present, probiotics especially containing SaccB (Saccharomyces boulardii) can be helpful. Also, intensive reduction in dietary sugars along with some antifungal Rx or supplements will be discussed.     VIRUSES  CMV/EBV Absent   If present, could be food/water borne exposures  Most people have been exposed to these viruses but a persistent body burden/ load of these viruses detected in the stool test could indicate ongoing viral trigger for disease within or outside the gut.  TO DO: support gut health with diet, stress modification and some supplements   PARASITES  Protozoa/ Worms Absent   If present, could be food/water borne exposures. Can just be passing through or triggers for disease in and outside the intestines.  Worm testing added in Jan '18  TO DO:   Special botanical or Rx treatments for these may be indicated depending on your symptoms and history.  DIGESTION     ELASTASE   Normal > 200  Ideal may be double that Normal range  500 If low, suspected low production of pancreatic digestive juices, which can be an indication that the pancreas is not getting signals from stomach acid to pass the baton to the pancreas to do it's job (HYPOchlorhydria).   If you are taking any antacid medication (examples: omeprazole, or ranitidine) this will be very important to address but do not quit Rx medications cold [place]. Discuss tapering dose with your clinician/s.    TO DO:  healthy plant-food rich diet, stress modification since stress/cortisol reduces digestion, may need to supplement your meals with dietary aides with enzymes, betaine HCL and/or bile , in discussion with your clinician.   STEATOCRIT=   FAT LOSS INDEX  Normal < 15% Normal range   If elevated, suspect poor digestion of fats, possibly related to poor pancreas function, or maldigestion from reduced acid or inefficient bile production, as in cases of absent or diseased gall bladder or reduced gall bladder function.    TO DO: healthy plant-food rich diet, stress modification since stress/cortisol reduces digestion, may need to supplement your meals with dietary aides with enzymes, betaine HCL and/or bile , in discussion with your clinician.   IMMUNE DEFENSES     SECRETORY IgA (sIgA)  Normal >510 -2010 LOW   If low, then first line intestinal defenses are depleted or insufficient for keeping undesirable bacteria/viruses/parasites in check; We may discuss supplementing the immune defenses with IgG and/or colostrum supplements.  If high, then intestine may be actively engaged in a defense process and we will explore what those triggers are.    TO DO: address causes for imbalances with attention to diet and some supplements such as probiotics, IgG (immuneglobulin) or colostrum, etc.   WHEAT/GLIADIN Antibodies to gliadin  (Special forces against wheat or similar proteins)  Normal 0-157   Normal range  75 If over the somewhat arbitrary number of 157, indicates intestinal defenses against wheat proteins/ gluten is on high alert;   If the number is normal here but the total of secretory IgA is below normal, suggests that the antibodies against gluten might be higher if the total sIgA were normal.    TO DO: eliminate wheat and keep other grains in diet low as well. If one is already gluten zero, the marker could indicate exposure to other similar proteins, such as in oats/corn, etc  Discuss further with a functional medicine  knowledgeable dietician.   INFLAMMATION     CALPROTECTIN    OLD BAR:  Normal < 50  Borderline 50-120    NEW BAR < 173 NORMAL   Normal range     A chemical made by White Blood Cells (Neutrophils). If elevated, a sign of inflammation, separates more benign Irritable bowel (IBS) from Inflammatory Bowel diseases (IBD) like colitis.  TO DO: Treating the underlying cause of the inflammation if from food or dysbiosis or infection. Antiinflammatory and gut healing supplements like probiotics, turmeric and/or L glutamine combinations are available. Discuss with clinician.     If elevated, we will want to retest this to make sure the number is normalizing. A colonoscopy may also be necessary to address high levels of calprotectin out of concern for damage to the colon lining.   BLOOD IN STOOL None If present, can be a marker for bleeding intestinal process or hemorrhoids   OTHER     B GLUCURONIDASE = TOX RECYCLER  Normal < 2486 Normal  range   Made by some bacteria in the intestinal tract. If elevated, it also supports a concern for DYSBIOSIS, and when toxins tagged by liver/gut for removal, the toxins may be REabsorbed too much and this may overload liver detox abilities  TO DO: Probiotics may be indicated to help improve healthier balance of bacteria in the gut   ADD ON:  ZONULIN  Normal < 15-107   NA Elevation may correlate with increased intestinal permeability  I am no longer doing this test for most people since the cost of the test did not add significantly to the benefit of knowing the results.   ANTIBIOTIC RESISTANCE GENES FEW Present Tests for sensitivity to certain  Prescription antibiotic medications.  May help with choices of medications if antibiotics are ever needed in the future.  Present means your bugs are smart and may outsmart the antibiotic category of meds   * Dysbiosis (also called dysbacteriosis) is a term for a microbial imbalance or maladaptation on or inside the body, such as an impaired  microbiota. Dysbiosis is most commonly reported as a condition in the gastrointestinal tract, particularly during small intestinal bacterial overgrowth (SIBO) or fungal overgrowth (SIFO). It has been reported to be associated with illnesses, such as periodontal disease, inflammatory bowel disease, chronic fatigue syndrome, obesity, cancer,bacterial vaginosis, and various colitis.        ================    Follow Up:  No Follow-up on file.  to review tests as specified in AVS    Phyllis Ginger, MD  Integrative/Functional Medicine Consultant  [facility], Adjunct Associate Professor  [facility]  [address]  [place], [place] [address]  [phone])      ===============================

## 2017-08-02 NOTE — Progress Notes (Signed)
Received paperwork from [facility] regarding, HHC and POC, dated 06/08/2016-08/06/2016. Paperwork will be placed in MD's box for completion and signing, . Gavin Pound 08/02/2017 9:45 AM

## 2017-08-02 NOTE — Progress Notes (Signed)
Received completed paperwork,faxed to number given Gavin Pound 08/03/2017 11:02 AM

## 2017-08-08 ENCOUNTER — Ambulatory Visit: Payer: PRIVATE HEALTH INSURANCE

## 2017-08-08 MED ORDER — busPIRone (BUSPAR) 10 MG tablet
10 | ORAL_TABLET | ORAL | 0 refills | Status: AC
Start: 2017-08-08 — End: 2017-09-07

## 2017-08-18 ENCOUNTER — Ambulatory Visit: Payer: PRIVATE HEALTH INSURANCE | Attending: Psychiatry

## 2017-08-23 ENCOUNTER — Ambulatory Visit: Payer: PRIVATE HEALTH INSURANCE

## 2017-08-25 ENCOUNTER — Ambulatory Visit: Admit: 2017-08-25 | Discharge: 2017-08-25 | Payer: PRIVATE HEALTH INSURANCE

## 2017-08-25 ENCOUNTER — Other Ambulatory Visit: Admit: 2017-08-25 | Payer: PRIVATE HEALTH INSURANCE

## 2017-08-25 DIAGNOSIS — E119 Type 2 diabetes mellitus without complications: Secondary | ICD-10-CM

## 2017-08-25 DIAGNOSIS — D839 Common variable immunodeficiency, unspecified: Secondary | ICD-10-CM

## 2017-08-25 LAB — VITAMIN D 25 HYDROXY: Vit D, 25-Hydroxy: 28.3 ng/mL — ABNORMAL LOW (ref 30.0–100)

## 2017-08-25 LAB — TSH: TSH: 7.4 u[IU]/mL — ABNORMAL HIGH (ref 0.45–4.12)

## 2017-08-25 LAB — T4, FREE: Free T4: 0.94 ng/dL (ref 0.61–1.76)

## 2017-08-25 NOTE — Progress Notes (Deleted)
ASSESSMENT AND PLAN:  TL/pkd/[id number]  [id number]

## 2017-08-25 NOTE — Unmapped (Addendum)
Chief Complaint   Patient presents with   ??? New Patient Visit/ Consultation     ref by           History of Present Illness  HPI  Emily Bonilla is a 57 y.o. 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   This was copied from patient email in May 2019.  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 Dr Emily Bonilla( Allergist), Dr Emily Bonilla(hematology), Dr Emily Bonilla( neurologist). ??Although, I would go to [facility] as needed for tweeks in my care.   IV fluids were very important to keep my body stable. ??It's amazing how good I feel after getting fluids. ??When my body stresses my Mast cells flair, I flush more and have increased diarrhea which is not helpful for fluid retention, increased inflammation, brain fog, extreme fatigue, BP drops with passing out( syncope) and until the last 2 years my lungs were very bad. ( I'm appreciative that the last two years my lungs have been great!!), itching, VCD, MTD issues, and two more Life flight transports needed in 2009, 2011 both while on vacation out of state. I had severe reactions that could not be controlled by local ER so I was air vac to hospitals that knew how to treat Mast Cell Activation. ?? In 2007 Dr Emily Bonilla(he is at [facility] now)Hematology was brought onto my care. ??   He evaluated me from top to bottom. ??Found that I would do better on IVIG, IV fluids, IV iron and magnesium. ?? B 12 levels fluctuated but never as low as they are now. ??I Always had issues with folic acid levels. But are ok now.   Back to Dr Emily Bonilla after my veins developed so much scar tissue and Emergency Response teams could not get IV in during emergencies and the IVIG replacement needed my first port was put in 2008. ??I had that port until 2013. A new port was put in on opposite side in 2014 due  to sluggish Infusions. [facility] [place] [facility] put me on Daily IV fluids1000 cc of LR, IV push benadryl q4-6 hours via my port. ??Steroids as needed. ??I was in a awful flair for several years. ??As hard as it was to keep up with the regiment ??I felt so much better and was able to enjoy life more, see friend and travel.. The summer of 2015 was when I kept having increased seizures and syncope in grocery stores produce dept( [employer] and bi lo) the seizures wer increasing big time. ??So the Allergist put me back on doxipen and steroids with my IV benadryl and IV fluids I got better. June 2017 broke legs after syncope from smelling perfum. Allergic reactions to O2 mask and antibiotics and   pain meds triggered symptoms and my face, lower lip, eyes swelled and then became infected. ??I was hospitalized 3 months for face surgery, left tibia fib multipal break and mast cell reaction. CVID was kicking in as my IgG levels were 319. My port was taken out and a pic line put in just incase my port compromised. ?? My port came back clean from infection. ??After I was discharged from hospital to rehab nursing home to slowly ambulate as I had not been at bearing for 4 months. ??  I stayed 3 weeks as they had cockroaches and could not take care of my dietary needs. ??I left and rehabbed myself. ??Since then I have had flairs related to allergy to hardware to left tibia fib and the transition off my IV fluids and trying to find a hospital that could treat me/not afraid of my conditions. ??That took 4 years!!!!!!!! ??   During all the medical issues I was going through a divorce Oct 2012.   That was rough transission as my drs of 14 years retired and/or left [employer] [place] after affordable health care act started to be implimented.   Lots of changes and I went to get psychotherapy weekly to bi weekly for the next 4 years. ??This was very important to my over all health. My ex and I went to marriage  counciling 4 years weekly to deal with the stressors of my illness on our marriage before the divorce.     Now my thoughts about B12 is if you find one I can tolerate let's get this on board. ??I talked to my Nurse case Manager(Emily Bonilla) last week and she will aprove IV or IM as needed and she [facility] if they Bonilla't feel comfortable she will aprove inpatient.   I'll try the oral Vit D and again Emily Bonilla, my nurse case manager for Emily Bonilla on aging will aprove what you order for me. ??So just fax the order of what you want me to be on. ??Emily Bonilla will get it approved and have [facility] deliver it. ??I dont have a compounding pharmacy locally. ?? If you can suggest one and they can compound anything you order me I'll see how the insurance pays.   SLOW is a good thing. ??My body can't tolerate to many changes at one time. ??Doing one med then add another med.........   I get overwhelm fast lately. ??I'm sorry but this is getting difficult after all the years....   I'm just getting tired....     She states her last bone marrow biopsy 3 years ago in [place]. Her last subcutaneous Immunoglobulin given 2 years ago. She is hoping to restart IV fluids at home. She had been receiving IV fluids three times a week. Her port has been removed and she is hoping to have new port placed.             Histories   She has a past medical history of Anaphylaxis; Anemia; Anxiety; Asthma; Atrial fibrillation (CMS Dx); Bronchitis; Heart disease; Neurological disease; OSA (obstructive sleep apnea); Osteoporosis; Psychogenic nonepileptic seizure; Seizures (CMS Dx); Spontaneous pneumothorax; Thyroid disease; Ulcer; and WPW (Wolff-Parkinson-White syndrome).    She has a past surgical history that includes Tibia fracture surgery; Tonsillectomy; Liver biopsy; Cardiac electrophysiology mapping and ablation; Facial cosmetic surgery; Bone marrow biopsy; removal hardware leg (Left, 08/17/2016); dental extraction  (Right, 04/25/2017); Cholecystectomy (2015); and Hysterectomy (2009).    Her family history includes Aneurysm in her mother; Kidney failure in her mother.    She reports that she has never smoked. She has never used smokeless tobacco. She reports that she does not drink alcohol or use drugs.    Allergies  Acetaminophen; Aspirin; Benzoic acid; Blue dye; Epinephrine; Flecainide; Gabapentin; Iodinated contrast media; Isopropyl alcohol; Latex; Lidocaine; Nsaids (non-steroidal anti-inflammatory drug); Onion; Other; Paraben; Perfume; Pregabalin; Quetiapine; Red dye; Sulfa (sulfonamide antibiotics); Sulfur; Verapamil; Yellow dye; Green dye; Onion extract; Amitriptyline; Doxepin; Duloxetine; Epi e-z pen; Hydroxyzine; Levofloxacin; Montelukast; Verapamil (bulk); Adhesive tape-silicones;  and Trazodone    Medications  Current Outpatient Prescriptions   Medication Sig   ??? busPIRone TAKE 2 TABLETS BY MOUTH 3 TIMES DAILY   ??? cetirizine    ??? cholecalciferol (vitamin D3) Take 1 capsule (50,000 Units total) by mouth every 7 days.   ??? diphenhydrAMINE Take 75-150 mg by mouth daily.   ??? doxepin Take 1 capsule (10 mg total) by mouth at bedtime.   ??? fexofenadine Take 1 tablet (180 mg total) by mouth daily.   ??? furosemide Take 1 tablet (20 mg total) by mouth daily.   ??? hydrOXYzine HCl TAKE ONE TABLET BY MOUTH 3 TIMES DAILY AS NEEDED FOR ANXIETY   ??? levalbuterol Inhale 3 mL (1.25 mg total) by nebulization every 6 hours as needed for Wheezing.   ??? levothyroxine Take 1 tablet (125 mcg total) by mouth daily.   ??? metFORMIN Take 1 tablet (500 mg total) by mouth 2 times a day with meals.   ??? montelukast TAKE ONE TABLET BY MOUTH AT BEDTIME.   ??? omeprazole Take 40 mg by mouth.   ??? onabotulinumtoxinA (cosmetic) Inject into the muscle.   ??? predniSONE TAKE ONE TABLET BY MOUTH DAILY.   ??? ranitidine Take 1 tablet (300 mg total) by mouth 3 times a day.   ??? triamcinolone APPLY 2 TIMES DAILY   ??? UNABLE TO FIND COMPOUNDED METHYLCOBALAMIN 1000 MCG EVERY OTHER  DAY BY INJECTION/ TRISTATE   ??? blood sugar diagnostic Use 1 test strip twice daily to check blood glucose   ??? blood-glucose meter Use meter to test blood glucose twice daily   ??? lancets Use on lancet twice daily to check blood glucose   ??? nebulizer and compressor aerosol device Use 1 ampule as directed every 4 hours. Indications: Please dispense 1 nebulizer unit     No current facility-administered medications for this visit.          The following portions of the patient's history were reviewed and updated as appropriate: allergies, current medications, past family history, past medical history, past social history, past surgical history and problem list.    Review of Systems   Constitutional: Positive for activity change and fatigue. Negative for appetite change, chills, diaphoresis, fever and weight loss.   Respiratory: Negative for cough and shortness of breath.    Cardiovascular: Negative for chest pain and leg swelling.   Gastrointestinal: Positive for constipation and diarrhea. Negative for abdominal pain, nausea and vomiting.   Musculoskeletal: Negative for gait problem.   Skin: Positive for color change. Negative for pallor and rash.   Neurological: Positive for headaches. Negative for dizziness, syncope, speech difficulty, weakness and light-headedness.   Psychiatric/Behavioral: Negative for confusion. The patient is nervous/anxious.        Vitals  BP 133/86 (BP Location: Right arm)    Pulse 88    Temp 98.7 ??Emily (37.1 ??C) (Oral)    Resp 18    Ht 5' 6 (1.676 m)    Wt 214 lb 9.6 oz (97.3 kg)    LMP  (LMP Unknown)    SpO2 96%    BMI 34.64 kg/m??     Pain Location:   Pain Score:  0-No pain [0]    Physical Exam   Vitals reviewed.  Constitutional: She is oriented to person, place, and time. She appears well-developed and well-nourished. No distress.   Cardiovascular: Normal rate.    Pulmonary/Chest: Effort normal. No respiratory distress.   Neurological: She is alert and oriented to person, place,  and time.    Skin: Skin is warm and dry. No rash noted. She is not diaphoretic. No erythema. No pallor.   Psychiatric: She has a normal mood and affect. Her behavior is normal. Judgment and thought content normal.      Neurologic Exam     Mental Status   Oriented to person, place, and time.       Review of Lab Results  Lab Results   Component Value Date    WBC 12.6 (H) 05/31/2017    HGB 14.8 05/31/2017    HCT 45.2 (H) 05/31/2017    PLT 317 05/31/2017    CREATININE 0.84 05/31/2017    BUN 10 05/31/2017    PROT 6.6 10/28/2016    AST 16 10/28/2016    ALT 14 10/28/2016    BILITOT 0.3 10/28/2016    CALCIUM 9.4 05/31/2017    MG 2.0 06/09/2017         Imaging   No results found.    Investigations Reviewed:   Labs      Cancer Staging:  Cancer Staging  No matching staging information was found for the patient.                   Assessment & Plan  Ms. 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  She has been under the care of several physicians, recently moved to [place] and trying to find stable physician coverage for her.    From her history, her symptoms are consistent with mast cell dysfunction, unpredictability of allergic reactions and many of the chronic vasomotor symptoms that she is describing in my opinion are consistent with mast cell disorder.  I have discussed with her that my expertise and management role with these disorders is somewhat limited. I have in the past been able to help some of these mast cell dysfunction patients with targeted medications like Gleevec.  In her case, it seems like it is a very specific request to help her with some ongoing hydration with ringer lactate as this in the past helped her significantly in terms of her fatigue and symptomatology.  There is a significant issue with the line and line care.  The main reason she is seeing me is to help with the management of  port supplies and also help her with hydration.  I think it is reasonable to give a trial of hydration to see if it improves her symptoms.  It did improve her symptoms in the past.  She was scheduled for a port placement, however, it was cancelled because there was not a willing physician available that can manage the port.    We did talk about possible downsides of having port, what is required to manage a port and risk of infection for indwelling catheters.  At this point, we will go ahead and proceed with another referral for a port placement.  We will provide her with some port care instructions.  We will engage home health care to help her provide hydration with ringer lactate 1 L, 2 days a week.  She is a Engineer, civil (consulting).  She should be able to help quite a bit and minimize the visit and help her to be more functional.    I have clearly explained to her that I would not be able to help her with any of the other management needs. She will continue follow-up with  her allergist for other mast cell stabilizing medications.  I will see her periodically every 3 to 6 months especially if we are continuing to administer ringer lactate for her.     Will re refer for port and try linger lactate twice weekly to see if it helps with symptomatology.         PHYSICIAN  ACKNOWLEDGEMENT:      I have personally performed a face to face diagnostic evaluation on this patient.?? I have reviewed and agree with the care plan.?? History and Exam by me shows:??      History of Present Illness:     Emily Bonilla is an 57 y.o. female is Bonilla today for Mastocytosis         Vitals:  Vitals:    08/25/17 1330   BP: 133/86   Pulse: 88   Resp: 18   Temp: 98.7 ??Emily (37.1 ??C)   SpO2: 96%     My exam showed well-developed and well-nourished individual. No respiratory distress. Normal respiratory effort. No abdominal distension. No cervical adenopathy. Skin is warm and dry. No rash noted.  Normal mood and affect.       Assessment/Plan:  See dictation above        Signed by Loleta Books MD MBA FACP on 09/06/2017 at 9:01 AM

## 2017-08-26 NOTE — Unmapped (Signed)
Addended by: Josefina Do on: 08/26/2017 03:48 PM     Modules accepted: Orders

## 2017-08-26 NOTE — Telephone Encounter (Signed)
Called and spoke with patient. She is calling to request orders be placed for her home hydration and home IV infusion. She states she met with Dr. Lourdes Sledge and spoke with him about it yesterday. Patient states she was told he would order Lactated Ringers 2 days a week. Two days on, five days off. Patient also needs a new order for a port placement stating her PORT procedure was cancelled because the order is outdated. Patient currently has [facility] and would like them to do the Infusion part also. Will forward to Mearl Latin for orders.

## 2017-08-26 NOTE — Unmapped (Signed)
Pt wanted to advise that she uses [facility] (ph# [phone]). Pt asking if it would be easier for her to use [facility] for IV set up then IV treatments be mailed by infusion pharmacy. Pt can be reached at [phone]

## 2017-08-26 NOTE — Unmapped (Signed)
August 26, 2017 12:00 PM      Emily Bonilla has called to advise of the following:    ?? She has requested the office contact her nurse case manager to coordinate her home infusion. Please contact:    [name] [facility]  Phone: #[phone]   Fax: [phone]    Please contact the patient at [phone] (home) or [phone] (mobile)

## 2017-08-26 NOTE — Telephone Encounter (Signed)
Called patient and let her know that the referral for [facility] was in EPIC. Number provided for her to call and schedule. Reassured her that paperwork: IV hydration orders, demographics, Meds&allergies and Insurance card faxed to Home infusion company as she requested.

## 2017-08-26 NOTE — Telephone Encounter (Signed)
Called and spoke with [name]. She states she is patients case manager and does not have anything to do with her IV home infusions. I then called [facility] who patient has Non skilled nursing through. They verified that they do not do home IV infusions.  Called patient at home to let her know that [facility] is non skilled and does not provide IV services. She is requesting [facility] IV home infusions. [facility] IV infusions phone number [phone]. Spoke with Kenney Houseman and she states they do not take [place]/Medicare. Will fax paperwork to [employer].

## 2017-08-29 MED ORDER — levothyroxine (SYNTHROID, LEVOTHROID) 137 MCG tablet
137 | ORAL_TABLET | Freq: Every day | ORAL | 11 refills | Status: AC
Start: 2017-08-29 — End: 2018-07-20

## 2017-08-30 NOTE — Telephone Encounter (Signed)
Patient called requesting to schedule port placement. Spoke to patient and informed her that we are currently working on possible admission orders since she has previously been admitted for minor procedures. Patient states she would like to try to have the surgery as outpatient and have her [facility] nurse stay with her that night to monitor for any reactions. Will speak with the MDs and then notify patient of date and time for port placement.

## 2017-08-30 NOTE — Telephone Encounter (Signed)
Patient contacted and educated on surgery instructions below. All questions answered to their satisfaction. Copy of instructions will be sent via MyChart to patient. Patient will call with any additional questions or concerns.     Pre-Procedure Instructions    Were pleased that you have chosen [facility] for your upcoming procedure.  The staff serving you is professionally trained to provide the highest quality care.  We encourage you to ask questions and to let the staff know your special needs.  We want your visit to be as comfortable as possible.    Your procedure is scheduled on 09/06/17 at 12:30 PM with Dr. Penni Bombard.  Please arrive at 10:30 AM at: [facility] the second floor of the main hospital      DO NOT EAT OR DRINK ANYTHING (including gum, mints, water, etc.) after midnight the night before your procedure.  You may brush your teeth and gargle on the morning of surgery, but do not swallow any water.  You may take the following medications with a small sip of water:  Pre-op/Anesthesia nurse will be calling you to review your history and medications. Please have a list available for her to review and she will tell you what you can and cannot take the day of surgery.    Please avoid aspirin or ibuprofen containing products for 1 week prior to surgery.  Please notify the surgeon or nurse if you are taking any blood thinners for any reason as there may be special instructions regarding these medications.     Please make transportation arrangements and bring a responsible adult to accompany you home and remain with you for 24 hours.   Leave valuables (money, jewelry, credit cards) at home.  If you wear glasses or contacts, bring a case for safekeeping.   Wear casual, loose fitting, and comfortable clothing.  A gown will be provided.  If you are staying overnight, bring a small overnight bag.  (Storage space is limited.)   Please remove all makeup, jewelry, body piercings,  powder, perfume, and nail polish before you arrive.   Bring a list of your medications and dose including herbal and over-the-counter medications.  Do not bring any pills or medications to the hospital. (Exception: transplant patients.)   Bring a photo ID and your insurance card so we can bill your insurance company directly.   Please do not bring any children under the age of 37 to the hospital.   Do not shave in the area of the surgery for 2 days prior to surgery.  If needed, a trained staff member will clip the area immediately before your surgery.   Quit smoking as far in advance of surgery as possible.  Patients who quit at least 30 days before surgery may have better outcomes.   If you are diabetic, pay close attention to your blood sugar and try to keep it in the range your doctor wants it to be in.  If you have low blood sugar prior to coming in to the hospital you may drink a small amount of CLEAR SUGAR-CONTAINING FLUIDS WITHOUT PULP SUCH AS APPLE JUICE OR CLEAR SODA.  Depending on the procedure you are having this could impact the timing of your surgery.   Discuss discontinuing herbal medications with your doctor before surgery.   Please shower at home the evening before and the morning of surgery using an antiseptic agent, if provided, or antibacterial soap.   If you suspect that you have an  infection prior to surgery, contact your doctor and surgeon prior to surgery.  Also tell the pre-surgery nurse on the day of surgery.    Additional instructions: Hold Metformin the morning of surgery. Refer to instructions from pre-op/anesthesia nurse regarding the rest of your medications.    Contact information:    Surgical Oncology Main office # [phone]  Nurse :  Alveta Heimlich RN

## 2017-09-01 NOTE — Telephone Encounter (Signed)
LVM asking her to come in at noon for her Botox treatment with Dr. Elmyra Ricks at [facility]. Asked her to return call to confirm.

## 2017-09-02 NOTE — Unmapped (Signed)
Take the following meds a.m. of surgery with sip of water,buspar,zyrtec,synthroid,zantac,pepcid,Avoid over the counter blood thinners:  Aspirin, vit e, fish oil, NSAIDS (unless otherwise directed by your physician).Call back number provided.     08/30/17 0001   Pre-op Phone Call   Surgery Time Verified Yes  (1230 on 09/06/17)   Arrival Time Verified 1030   Surgery Location Verified Yes   Remind patient to bring picture ID and insurance card Yes   Medical History Reviewed Yes   NPO Status Reinforced Yes   Ride and Caregiver Arranged Yes   Ride Caregiver Provider Imani Sherre-[facility]   Phone Number for Ride/Caregiver [phone]   Instructions to bring current medication list No  (Meds reviewed with pt)

## 2017-09-05 ENCOUNTER — Ambulatory Visit: Admit: 2017-09-05 | Payer: PRIVATE HEALTH INSURANCE | Attending: Neurology

## 2017-09-05 ENCOUNTER — Ambulatory Visit: Payer: PRIVATE HEALTH INSURANCE | Attending: Family

## 2017-09-05 DIAGNOSIS — G43719 Chronic migraine without aura, intractable, without status migrainosus: Secondary | ICD-10-CM

## 2017-09-05 MED ORDER — sodium chloride 0.9%
INTRAMUSCULAR | Status: AC
Start: 2017-09-05 — End: 2017-09-05

## 2017-09-05 MED ORDER — botulinum toxin A (BOTOX) injection 200 Units
100 | Freq: Once | INTRAMUSCULAR | Status: AC
Start: 2017-09-05 — End: 2017-09-05
  Administered 2017-09-05: 17:00:00 200 [IU] via INTRAMUSCULAR

## 2017-09-05 MED ORDER — botulinum toxin A (BOTOX) 100 unit injection
100 | INTRAMUSCULAR | Status: AC
Start: 2017-09-05 — End: 2017-09-05

## 2017-09-05 MED FILL — SODIUM CHLORIDE 0.9 % INJECTION SOLUTION: INTRAMUSCULAR | Qty: 10

## 2017-09-05 MED FILL — BOTOX 100 UNIT INJECTION: 100 100 unit | INTRAMUSCULAR | Qty: 2

## 2017-09-05 NOTE — Unmapped (Signed)
BOTOX PREEMPT PROTOCOL   ??  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  Total headache days per month: 5 migraines;  Some areas at top of head did not get great relief.  Total days headache free per month: Marked improvement in headache.  She wanted 400 units injected which I cannot do.  Severity of headaches: moderate  Botox effective: Yes  ??  The risks, benefits and anticipated outcomes of the procedure, the risks and benefits of the alternatives to the procedure, and the roles and tasks of the personnel to be involved, were discussed with the patient. The patient has given written informed consent to the procedure and agrees to proceed.Yes  ??  Time out done with patient and RN/MA in the room. Patient identified by name and DOB.  ??  Treatment # 2??????Dr. Rivner has done many injections before up to 400 UNITS especially in the back. ??I am only doing the BOTOX protocol for chronic migraine intractable.  BOTOX brought in by patient? No  Lot #: Exp: ??  Dilution: 5 units/0.1 ml ( 100 unit vial with 2 cc diluent or 200 unit vial with 4 cc diluent)  Diluent: normal saline  Indication: Chronic Migraine??Intractable  ??  Injection Sites??  Muscle??????????????????????????????????????????????????Fixed Site/Fixed Dose ??????????????????????????????????????????????L ????????R Optional follow pain # units   ??  Corrugator??????????????????????????????????????10 Units divided in 2 sites??????????????????????????????????????????  ??  Procerus ??????????????????????????????????????????5 Units in 1 site ????????????????????????????????????????????????????????????????????????  ??  Frontalis????????????????????????????????????????????20 Units divided in 4 sites????????????????????????????????????????????  ??  Temporalis ??????????????????????????????????40 Units divided in 8 sites ????????????????????????????????????????  ??  Occipitalis ??????????????????????????????????????30 Units divided in 6 sites ????????????????????????????????????????  ??  Cervical PSPs??????????????????????????20 Units divided in 4 sites??????????????????????????????????????????  ??  Trapezius????????????????????????????????????????30 Units divided in 6 sites??????????????????????????????????????22.5 units extra left trapezius in 5 divided  doses  ????????????????????????????????????????????????????????????????????????????????????????????????????????????????????????????????????????????????????????????????????????????????????????????????22.5 units extra right trapezius in 5 divided doses  ????????????????????????????????????????????????????????????????????                                                               Low injections down mid spine where muscle is tight.       Dry needling done across bilateral cervical paraspinal muscles where she said she had injections of 12 units each.   She said Dr. Leonard Downing gave her too much and she could not raise her head.  I told her I would not do that.    I told her I would only do the BOTOX protocol approved across [place] for chronic migraine headache  ??  ??  ??  Other sites:  SCM ????????????????????????????????????????????????????????????????????????????????????????????????????????????????????????????????????????????????????????????????????????????????  ??  Rhomboids ??????????????????????????????????????????????????????????????????????????????????????????????????????????????????????????????????????????????????????????????  ??  Other ????????????????????????????????????????????????????????????????????????????????????????????????????????????????????????????????????????????????????????????????????????????????  ??  ????????????????????????????????????????????????????????????????????????????????????????????????????????????????????????????????????????????????????????????????????????????????????????????????_________   ??  Subtotals ????????????????????????????????????????????????????????????????????????????????????????????????????????????????????????????????????????????????????????????????155 Units plus 45 units EXTRA as above??  ??  Total Units used:200??  Total Units wasted:0??  Patient did??tolerate procedure.   ??  Change in Treatment Plan?no  ??  RTC: 3 Months

## 2017-09-05 NOTE — Telephone Encounter (Signed)
[name], the patient's case manager, called in asking about her port placement tomorrow. She states that the patient's [facility] nursing is not skilled and that the patient would essentially just have an aide to be with her the 24 hours after her procedure.   She also was asking about the orders for the patient's infusions. She states that based on the patient's insurance her prescriptions should be sent to [facility] and her infusions should be handled by Physician's choice. Will refer to hem/onc.

## 2017-09-06 ENCOUNTER — Ambulatory Visit: Admit: 2017-09-06 | Payer: PRIVATE HEALTH INSURANCE

## 2017-09-06 ENCOUNTER — Ambulatory Visit: Payer: PRIVATE HEALTH INSURANCE

## 2017-09-06 LAB — POC GLU MONITORING DEVICE: POC Glucose Monitoring Device: 103 mg/dL — ABNORMAL HIGH (ref 70–100)

## 2017-09-06 MED ORDER — HYDROmorphone (DILAUDID) injection Syrg 0.2 mg
0.5 | INTRAMUSCULAR | Status: AC | PRN
Start: 2017-09-06 — End: 2017-09-06

## 2017-09-06 MED ORDER — diphenhydrAMINE (BENADRYL) injection
50 | INTRAMUSCULAR | Status: AC | PRN
Start: 2017-09-06 — End: 2017-09-06
  Administered 2017-09-06 (×2): 25 via INTRAVENOUS

## 2017-09-06 MED ORDER — diphenhydrAMINE (BENADRYL) 50 mg/mL injection
50 | INTRAMUSCULAR | Status: AC
Start: 2017-09-06 — End: 2017-09-06
  Administered 2017-09-06: 20:00:00 50 via INTRAVENOUS

## 2017-09-06 MED ORDER — ceFAZolin (ANCEF) 2 gram in NSS 100 mL IVPB
2 | INTRAVENOUS | Status: AC | PRN
Start: 2017-09-06 — End: 2017-09-06

## 2017-09-06 MED ORDER — famotidine (PF) (PEPCID) injection
20 | INTRAVENOUS | Status: AC | PRN
Start: 2017-09-06 — End: 2017-09-06
  Administered 2017-09-06: 18:00:00 20 via INTRAVENOUS

## 2017-09-06 MED ORDER — propofol 10 mg/ml (DIPRIVAN) injection
10 | INTRAVENOUS | Status: AC | PRN
Start: 2017-09-06 — End: 2017-09-06
  Administered 2017-09-06 (×2): 40 via INTRAVENOUS
  Administered 2017-09-06 (×3): 20 via INTRAVENOUS

## 2017-09-06 MED ORDER — sodium chloride, irrigation 0.9 % irrigation
0.9 | Status: AC | PRN
Start: 2017-09-06 — End: 2017-09-06
  Administered 2017-09-06: 19:00:00 500

## 2017-09-06 MED ORDER — heparin (porcine) 2,500 Units in sodium chloride 0.9 % 250 mL IV infusion
INTRAVENOUS | Status: AC | PRN
Start: 2017-09-06 — End: 2017-09-06
  Administered 2017-09-06: 19:00:00 2500

## 2017-09-06 MED ORDER — naloxone (NARCAN) injection 0.04 mg
0.4 | INTRAMUSCULAR | Status: AC | PRN
Start: 2017-09-06 — End: 2017-09-06

## 2017-09-06 MED ORDER — diphenhydrAMINE (BENADRYL) injection 50 mg
50 | Freq: Once | INTRAMUSCULAR | Status: AC | PRN
Start: 2017-09-06 — End: 2017-09-06

## 2017-09-06 MED ORDER — HYDROmorphone (DILAUDID) injection Syrg 0.6 mg
1 | INTRAMUSCULAR | Status: AC | PRN
Start: 2017-09-06 — End: 2017-09-06

## 2017-09-06 MED ORDER — HYDROmorphone (DILAUDID) injection Syrg 0.4 mg
0.5 | INTRAMUSCULAR | Status: AC | PRN
Start: 2017-09-06 — End: 2017-09-06

## 2017-09-06 MED ORDER — methylPREDNISolone sod suc(PF) (SOLU-medrol) SolR 125 mg
125 | Freq: Once | INTRAMUSCULAR | Status: AC
Start: 2017-09-06 — End: 2017-09-06
  Administered 2017-09-06: 18:00:00 125 mg via INTRAVENOUS

## 2017-09-06 MED ORDER — HYDROmorphone (DILAUDID) injection Syrg 0.1 mg
0.5 | INTRAMUSCULAR | Status: AC | PRN
Start: 2017-09-06 — End: 2017-09-06

## 2017-09-06 MED ORDER — famotidine (PF) (PEPCID) injection 40 mg
20 | Freq: Once | INTRAVENOUS | Status: AC
Start: 2017-09-06 — End: 2017-09-06
  Administered 2017-09-06: 18:00:00 40 mg via INTRAVENOUS

## 2017-09-06 MED ORDER — diphenhydrAMINE (BENADRYL) injection 50 mg
50 | Freq: Once | INTRAMUSCULAR | Status: AC
Start: 2017-09-06 — End: 2017-09-06
  Administered 2017-09-06: 18:00:00 50 mg via INTRAVENOUS

## 2017-09-06 MED ORDER — HYDROmorphone (DILAUDID) injection Syrg 0.6 mg
1 | INTRAMUSCULAR | Status: AC | PRN
Start: 2017-09-06 — End: 2017-09-06
  Administered 2017-09-06: 20:00:00 1 mg via INTRAVENOUS

## 2017-09-06 MED ORDER — HYDROmorphone (DILAUDID) injection Syrg 0.5 mg
0.5 | Freq: Once | INTRAMUSCULAR | Status: AC | PRN
Start: 2017-09-06 — End: 2017-09-06

## 2017-09-06 MED ORDER — HYDROmorphone (DILAUDID) injection Syrg 0.1 mg
0.5 | INTRAMUSCULAR | Status: AC | PRN
Start: 2017-09-06 — End: 2017-09-06
  Administered 2017-09-06: 19:00:00 .75 mg via INTRAVENOUS

## 2017-09-06 MED ORDER — HYDROmorphone (DILAUDID) 1 mg/mL injection Syrg
1 | INTRAMUSCULAR | Status: AC
Start: 2017-09-06 — End: 2017-09-06

## 2017-09-06 MED ORDER — propofol (DIPRIVAN) infusion 10 mg/mL
10 | INTRAVENOUS | Status: AC | PRN
Start: 2017-09-06 — End: 2017-09-06
  Administered 2017-09-06: 18:00:00 120 via INTRAVENOUS

## 2017-09-06 MED ORDER — lactatedRingersinfusion
INTRAVENOUS | Status: AC
Start: 2017-09-06 — End: 2017-09-06
  Administered 2017-09-06: 18:00:00 via INTRAVENOUS

## 2017-09-06 MED ORDER — diphenhydrAMINE (BENADRYL) injection 50 mg
50 | Freq: Once | INTRAMUSCULAR | Status: AC
Start: 2017-09-06 — End: 2017-09-06

## 2017-09-06 MED FILL — FAMOTIDINE (PF) 20 MG/2 ML INTRAVENOUS SOLUTION: 20 20 mg/2 mL | INTRAVENOUS | Qty: 4

## 2017-09-06 MED FILL — CEFAZOLIN (ANCEF) 2 GRAM IN NSS IVPB - COMPOUNDED (ADM): 2 2 gram/100 mL | INTRAVENOUS | Qty: 100

## 2017-09-06 MED FILL — HYDROMORPHONE 1 MG/ML INJECTION SYRINGE: 1 1 mg/mL | INTRAMUSCULAR | Qty: 1

## 2017-09-06 MED FILL — DIPHENHYDRAMINE 50 MG/ML INJECTION SOLUTION: 50 50 mg/mL | INTRAMUSCULAR | Qty: 1

## 2017-09-06 NOTE — H&P (Signed)
Surgical Oncology History and Physical Note    Patient: Emily Bonilla Admitting Service: Surgical Oncology   MRN: [id number] Age: 57 y.o.     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 The patient will be needing IVFs and therefore presents for placement of a Port A Cath    Review of System:  Constitutional: Negative for activity change, chills, diaphoresis, fatigue and fever.   HENT: Negative.    Eyes: Negative.    Respiratory: Negative for cough, chest tightness, shortness of breath and wheezing.  Cardiovascular: Negative for chest pain.   Gastrointestinal: Negative for abdominal distention, abdominal pain, blood in stool, nausea and vomiting.   Endocrine: Negative.    Genitourinary: Negative.    Musculoskeletal: Negative.    Skin: Negative.    Allergic/Immunologic: Negative.   Neurological: Negative for light-headedness and headaches.   Hematological: Negative.    Psychiatric/Behavioral: Negative.          Medications:  Facility-Administered Medications Prior to Admission   Medication Dose Route Frequency Provider Last Rate Last Dose    [COMPLETED] botulinum toxin A (BOTOX) injection 200 Units  200 Units Intramuscular Once Stefan Church, CNP   200 Units at 09/05/17 1245     Prescriptions Prior to Admission   Medication Sig Dispense Refill Last Dose    busPIRone (BUSPAR) 10 MG tablet TAKE 2 TABLETS BY MOUTH 3 TIMES DAILY 180 tablet 0 Taking    cetirizine (ZYRTEC) 10 mg Cap    09/06/2017 at Unknown time    cholecalciferol, vitamin D3, (D3-50 CHOLECALCIFEROL) 50,000 unit capsule Take 1 capsule (50,000 Units total) by mouth every 7 days. 30 capsule 0 09/05/2017 at Unknown time    diphenhydrAMINE (BENADRYL) 25 mg capsule Take 75-150 mg by mouth daily.   09/06/2017 at Unknown time    doxepin (SINEQUAN) 10 MG capsule Take 1  capsule (10 mg total) by mouth at bedtime. 90 capsule 3 09/05/2017 at Unknown time    fexofenadine (ALLEGRA) 180 MG tablet Take 1 tablet (180 mg total) by mouth daily. 30 tablet 3 09/05/2017 at Unknown time    furosemide (LASIX) 20 MG tablet Take 1 tablet (20 mg total) by mouth daily. 90 tablet 3 09/05/2017 at Unknown time    levothyroxine (SYNTHROID, LEVOTHROID) 137 MCG tablet Take 1 tablet (137 mcg total) by mouth daily. 30 tablet 11 09/06/2017 at Unknown time    metFORMIN (GLUCOPHAGE) 500 MG tablet Take 1 tablet (500 mg total) by mouth 2 times a day with meals. 60 tablet 5 09/05/2017 at Unknown time    montelukast (SINGULAIR) 10 mg tablet TAKE ONE TABLET BY MOUTH AT BEDTIME. 30 tablet 0 09/05/2017 at Unknown time    nebulizer and compressor Devi Use 1 ampule as directed every 4 hours. Indications: Please dispense 1 nebulizer unit 1 each 0 09/05/2017 at Unknown time    onabotulinumtoxinA (BOTOX COSMETIC) 50 unit SolR Inject into the muscle.   Past Week at Unknown time    predniSONE (DELTASONE) 20 MG tablet TAKE ONE TABLET BY MOUTH DAILY. 30 tablet 0 Taking    ranitidine (ZANTAC) 300 MG tablet Take 1 tablet (300 mg total) by mouth 3 times a day. 90 tablet 11 09/05/2017 at Unknown time    triamcinolone (KENALOG) 0.1 %  ointment APPLY 2 TIMES DAILY 80 g 11 Taking    VITAMIN D3-VITAMIN K2, MK4, ORAL Take by mouth.   09/05/2017 at Unknown time    blood sugar diagnostic Strp Use 1 test strip twice daily to check blood glucose 100 strip 2 Taking    blood-glucose meter Misc Use meter to test blood glucose twice daily 1 each 0 Taking    hydrOXYzine HCl (ATARAX) 25 MG tablet TAKE ONE TABLET BY MOUTH 3 TIMES DAILY AS NEEDED FOR ANXIETY 90 tablet 1 Taking    lancets Misc Use on lancet twice daily to check blood glucose 200 each 1 Taking    levalbuterol (XOPENEX) 1.25 mg/3 mL nebulizer solution Inhale 3 mL (1.25 mg total) by nebulization every 6 hours as needed for Wheezing. 72 mL 0 Taking    UNABLE TO FIND COMPOUNDED  METHYLCOBALAMIN 1000 MCG EVERY OTHER DAY BY INJECTION/ TRISTATE 1000 mL 11 Taking       Allergies   Allergen Reactions    Acetaminophen Anaphylaxis     Unknown    Aspirin Anaphylaxis     Unknown    Benzoic Acid Anaphylaxis    Blue Dye Anaphylaxis     Unknown    Epinephrine Anaphylaxis    Flecainide Shortness Of Breath     Itching, decreased libido, wheezing, flushing, numbness.     Gabapentin Anaphylaxis    Iodinated Contrast Media Anaphylaxis    Isopropyl Alcohol Anaphylaxis    Latex Anaphylaxis and Rash    Lidocaine Anaphylaxis    Nsaids (Non-Steroidal Anti-Inflammatory Drug) Anaphylaxis    Onion Anaphylaxis    Other Anaphylaxis     HAS MAST CELL ACTIVATION SYNDROME, SO PATIENT HAS ANAPHYLACTIC REACTIONS IF BODY RECOGNIZES TRIGGERS  CANNOT TOLERATE ANY DYES, PRESERVATIVES, PARABENS, ETC.  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    Paraben Anaphylaxis    Perfume Anaphylaxis    Pregabalin Anaphylaxis    Quetiapine Anaphylaxis     Mental Status Change    Red Dye Anaphylaxis     Unknown    Sulfa (Sulfonamide Antibiotics) Anaphylaxis    Sulfur Anaphylaxis     Pt states: Any alcohol product    Verapamil Anaphylaxis    Yellow Dye Anaphylaxis     Unknown    Green Dye Rash    Onion Extract Other (See Comments)    Amitriptyline     Doxepin Other (See Comments)     Unknown    Duloxetine      Other reaction(s): Other (See Comments)  Mental Status Change    Epi E-Z Pen     Hydroxyzine Itching     Unknown    Levofloxacin Itching    Montelukast     Verapamil (Bulk) Hives     Insomnia, flushing, scattered brain, migraine, itching, anxiety.     Adhesive Tape-Silicones Itching, Rash and Swelling     Unknown    Trazodone Rash        No history exists.       Past Medical History:   Diagnosis Date    Anaphylaxis     Anemia     Anxiety     Asthma     Atrial fibrillation (CMS Dx)      Bronchitis     Heart disease     Neurological disease     OSA (obstructive sleep apnea)     states resolved    Osteoporosis  Psychogenic nonepileptic seizure     Seizures (CMS Dx)     Spontaneous pneumothorax     states pneumomediatsium/pneumopericardium, self resolved    Thyroid disease     Ulcer     WPW (Wolff-Parkinson-White syndrome)        Past Surgical History:   Procedure Laterality Date    BONE MARROW BIOPSY      benign    CARDIAC ELECTROPHYSIOLOGY MAPPING AND ABLATION      attempted x 2, states not successful either time    CHOLECYSTECTOMY  2015    DENTAL EXTRACTION Right 04/25/2017    Procedure: EXTRACTION TOOTH # 31, BRIDGE RESECTIONING;  Surgeon: Gladys Damme, DMD;  Location: [facility] OR;  Service: Oral Surgery;  Laterality: Right;    FACIAL COSMETIC SURGERY      HYSTERECTOMY  2009    LIVER BIOPSY      benign hepatic adednoma per patient    REMOVAL HARDWARE LEG Left 08/17/2016    Procedure: REMOVAL OF HARDWARE LEFT TIBIA;  Surgeon: Andee Lineman, MD;  Location: [facility] OR;  Service: Orthopedics;  Laterality: Left;    TIBIA FRACTURE SURGERY      TONSILLECTOMY      + adenoids       Family History   Problem Relation Age of Onset    Kidney failure Mother     Aneurysm Mother        Social History     Social History    Marital status: Divorced     Spouse name: N/A    Number of children: N/A    Years of education: N/A     Occupational History    Not on file.     Social History Main Topics    Smoking status: Never Smoker    Smokeless tobacco: Never Used      Comment: Denies (11/15/2016)    Alcohol use No      Comment: Denies (11/15/2016)    Drug use: No      Comment: Denies (11/15/2016)    Sexual activity: Not Currently     Other Topics Concern    Caffeine Use Yes    Exercise No    Seat Belt Yes     Social History Narrative    No narrative on file         Vital Signs:  Vitals:    09/06/17 1038   BP: (!) 131/98   Pulse: 85   Resp: 15   Temp: 98.1 F (36.7 C)   SpO2: 100%        Physical Exam:  General: no apparent distress, conversant  Eyes: anicteric sclera, moist conjunctiva, pupils equal and round reactive to light  HENT: atraumatic mucous membranes moist  Prior bilateral port site locations well healed, left > right  Neck: trachea midline, full range of motion, no thyromegaly or adenopathy  Cardiac: regular rate and rhythm, no murmurs, rubs, and gallops  Respiratory: clear to auscultation bilaterally, normal respiratory effort  Gastrointestinal: abdomen is soft nontender, nondistended, no palpable masses  Extremity: warm, no clubbing, cyanosis, or edema  Lymph: no palpable lymphadenopathy  Skin: no rashes or ulcers, normal temperature and turgor  Psych: appropriate affect, alert and oriented to person, place, and time        Labs:  Lab Results   Component Value Date    WBC 12.6 (H) 05/31/2017    RBC 5.32 (H) 05/31/2017    HCT 45.2 (H) 05/31/2017    MCV 84.9 05/31/2017  MCH 27.9 05/31/2017    MCHC 32.9 05/31/2017    RDW 14.6 05/31/2017    PLT 317 05/31/2017     Lab Results   Component Value Date    WBC 12.6 (H) 05/31/2017    RBC 5.32 (H) 05/31/2017    HCT 45.2 (H) 05/31/2017    MCV 84.9 05/31/2017    MCH 27.9 05/31/2017    MCHC 32.9 05/31/2017    RDW 14.6 05/31/2017    PLT 317 05/31/2017           Invalid input(s): LABALBU        Imaging:  No results found.      Assessment and Plan  A 57 y.o. female presents with Mast Cell Degranulation Syndrome and need for central venous access  1. To OR today for placement of a Port A Cath  2. The risks, benefits, and alternatives of the proposed procedure were discussed with the patient and their family in detail which includes bleeding, infection, pneumothorax, and hemothorax. They were given the opportunity to ask questions and have those questions answered. They signed, witnessed, written informed consent.

## 2017-09-06 NOTE — Unmapped (Signed)
[place]  DEPARTMENT OF ANESTHESIOLOGY  PRE-PROCEDURAL EVALUATION    Emily Bonilla is a 57 y.o. year old female presenting for:    Procedure(s):  INSERTION POWER PORT A CATH    Surgeon:   Penni Bombard, MD    Chief Complaint     Poor venous access [I87.8]    Review of Systems     Anesthesia Evaluation         History of anesthetic complications (angioedema with necrosis of face from bag mask )         Cardiovascular:    Exercise tolerance: poor    (-) past MI, CABG/stent, dysrhythmias, angina, CHF.    Neuro/Muscoloskeletal/Psych:    (+) neuromuscular disease, headaches, arthritis, back problem and anxiety.  Seizures well controlled.    (-) TIA, CVA.     Pulmonary:    (+) asthma.  Sleep apnea.        GI/Hepatic/Renal:    GERD is well controlled.      Endo/Other:    (+) hypothyroidism and anemia.  Diabetes, well controlled.    (-) no thrombocytopenia, no DVT.     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 Anaphylaxis    ??? Anemia    ??? Anxiety    ??? Asthma    ??? Atrial fibrillation (CMS Dx)    ??? Bronchitis    ??? Heart disease    ??? Neurological disease    ??? OSA (obstructive sleep apnea)     states resolved   ??? Osteoporosis    ??? Psychogenic nonepileptic seizure    ??? Seizures (CMS Dx)    ??? Spontaneous pneumothorax     states pneumomediatsium/pneumopericardium, self resolved   ??? Thyroid disease    ??? Ulcer    ??? WPW (Wolff-Parkinson-White syndrome)        Past Surgical History     Past Surgical History:   Procedure Laterality Date   ??? BONE MARROW BIOPSY      benign   ??? CARDIAC ELECTROPHYSIOLOGY MAPPING AND ABLATION      attempted x 2, states not successful either time   ??? CHOLECYSTECTOMY  2015   ??? DENTAL EXTRACTION Right 04/25/2017    Procedure: EXTRACTION TOOTH # 31, BRIDGE RESECTIONING;  Surgeon: Gladys Damme, DMD;  Location: [facility] OR;  Service: Oral Surgery;  Laterality: Right;   ??? FACIAL COSMETIC SURGERY     ??? HYSTERECTOMY  2009   ???  LIVER BIOPSY      benign hepatic adednoma per patient   ??? REMOVAL HARDWARE LEG Left 08/17/2016    Procedure: REMOVAL OF HARDWARE LEFT TIBIA;  Surgeon: Andee Lineman, MD;  Location: [facility] OR;  Service: Orthopedics;  Laterality: Left;   ??? TIBIA FRACTURE SURGERY     ??? TONSILLECTOMY      + adenoids       Family History     Family History   Problem Relation Age of Onset   ??? Kidney failure Mother    ??? Aneurysm Mother        Social History     Social History     Social History   ??? Marital status: Divorced     Spouse name: N/A   ??? Number of children: N/A   ??? Years of education: N/A     Occupational History   ??? Not on file.  Social History Main Topics   ??? Smoking status: Never Smoker   ??? Smokeless tobacco: Never Used      Comment: Denies (11/15/2016)   ??? Alcohol use No      Comment: Denies (11/15/2016)   ??? Drug use: No      Comment: Denies (11/15/2016)   ??? Sexual activity: Not Currently     Other Topics Concern   ??? Caffeine Use Yes   ??? Exercise No   ??? Seat Belt Yes     Social History Narrative   ??? No narrative on file       Medications     Allergies:  Allergies   Allergen Reactions   ??? Acetaminophen Anaphylaxis     Unknown   ??? Aspirin Anaphylaxis     Unknown   ??? Benzoic Acid Anaphylaxis   ??? Blue Dye Anaphylaxis     Unknown   ??? Epinephrine Anaphylaxis   ??? Flecainide Shortness Of Breath     Itching, decreased libido, wheezing, flushing, numbness.    ??? Gabapentin Anaphylaxis   ??? Iodinated Contrast Media Anaphylaxis   ??? Isopropyl Alcohol Anaphylaxis   ??? Latex Anaphylaxis and Rash   ??? Lidocaine Anaphylaxis   ??? Nsaids (Non-Steroidal Anti-Inflammatory Drug) Anaphylaxis   ??? Onion Anaphylaxis   ??? 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    ??? Paraben Anaphylaxis   ??? Perfume Anaphylaxis   ??? Pregabalin Anaphylaxis   ??? Quetiapine Anaphylaxis     Mental Status Change   ??? Red Dye Anaphylaxis     Unknown   ??? Sulfa (Sulfonamide Antibiotics) Anaphylaxis   ??? Sulfur Anaphylaxis     Pt states: Any alcohol product   ??? Verapamil Anaphylaxis   ??? Yellow Dye Anaphylaxis     Unknown   ??? Green Dye Rash   ??? Onion Extract Other (See Comments)   ??? Amitriptyline    ??? Doxepin Other (See Comments)     Unknown   ??? Duloxetine      Other reaction(s): Other (See Comments)  Mental Status Change   ??? Epi E-Z Pen    ??? Hydroxyzine Itching     Unknown   ??? Levofloxacin Itching   ??? Montelukast    ??? Verapamil (Bulk) Hives     Insomnia, flushing, scattered brain, migraine, itching, anxiety.    ??? Adhesive Tape-Silicones Itching, Rash and Swelling     Unknown   ??? Trazodone Rash       Home Meds:  Prior to Admission medications as of 09/06/17 1039   Medication Sig Taking?   busPIRone (BUSPAR) 10 MG tablet TAKE 2 TABLETS BY MOUTH 3 TIMES DAILY Yes   cetirizine (ZYRTEC) 10 mg Cap  Yes   cholecalciferol, vitamin D3, (D3-50 CHOLECALCIFEROL) 50,000 unit capsule Take 1 capsule (50,000 Units total) by mouth every 7 days. Yes   diphenhydrAMINE (BENADRYL) 25 mg capsule Take 75-150 mg by mouth daily. Yes   doxepin (SINEQUAN) 10 MG capsule Take 1 capsule (10 mg total) by mouth at bedtime. Yes   fexofenadine (ALLEGRA) 180 MG tablet Take 1 tablet (180 mg total) by mouth daily. Yes   furosemide (LASIX) 20 MG tablet Take 1 tablet (20 mg total) by mouth daily. Yes  levothyroxine (SYNTHROID, LEVOTHROID) 137 MCG tablet Take 1 tablet (137 mcg total) by mouth daily. Yes   metFORMIN (GLUCOPHAGE) 500 MG tablet Take 1 tablet (500 mg total) by mouth 2 times a day with meals. Yes   montelukast (SINGULAIR) 10 mg tablet TAKE ONE TABLET BY MOUTH AT BEDTIME. Yes   nebulizer and compressor Devi Use 1 ampule as directed every 4 hours. Indications: Please dispense 1 nebulizer unit Yes   onabotulinumtoxinA (BOTOX  COSMETIC) 50 unit SolR Inject into the muscle. Yes   predniSONE (DELTASONE) 20 MG tablet TAKE ONE TABLET BY MOUTH DAILY. Yes   ranitidine (ZANTAC) 300 MG tablet Take 1 tablet (300 mg total) by mouth 3 times a day. Yes   triamcinolone (KENALOG) 0.1 % ointment APPLY 2 TIMES DAILY Yes   VITAMIN D3-VITAMIN K2, MK4, ORAL Take by mouth. Yes   blood sugar diagnostic Strp Use 1 test strip twice daily to check blood glucose    blood-glucose meter Misc Use meter to test blood glucose twice daily    hydrOXYzine HCl (ATARAX) 25 MG tablet TAKE ONE TABLET BY MOUTH 3 TIMES DAILY AS NEEDED FOR ANXIETY    lancets Misc Use on lancet twice daily to check blood glucose    levalbuterol (XOPENEX) 1.25 mg/3 mL nebulizer solution Inhale 3 mL (1.25 mg total) by nebulization every 6 hours as needed for Wheezing.    UNABLE TO FIND COMPOUNDED METHYLCOBALAMIN 1000 MCG EVERY OTHER DAY BY INJECTION/ TRISTATE        Inpatient Meds:  Scheduled:   ??? diphenhydrAMINE  50 mg Intravenous Once   ??? famotidine (PF)  40 mg Intravenous Once   ??? methylprednisolone (SOLU-medrol) IV  125 mg Intravenous Once     Continuous:   ??? lactated Ringers         PRN: ceFAZolin (ANCEF) IVPB    Vital Signs     Wt Readings from Last 3 Encounters:   09/06/17 214 lb (97.1 kg)   09/05/17 214 lb 3.2 oz (97.2 kg)   08/25/17 214 lb 9.6 oz (97.3 kg)     Ht Readings from Last 3 Encounters:   09/06/17 5' 6 (1.676 m)   09/05/17 5' 6 (1.676 m)   08/25/17 5' 6 (1.676 m)     Temp Readings from Last 3 Encounters:   09/06/17 98.1 ??F (36.7 ??C) (Temporal)   08/25/17 98.7 ??F (37.1 ??C) (Oral)   06/02/17 97.6 ??F (36.4 ??C) (Oral)     BP Readings from Last 3 Encounters:   09/06/17 (!) 131/98   09/05/17 130/82   08/25/17 133/86     Pulse Readings from Last 3 Encounters:   09/06/17 85   09/05/17 92   08/25/17 88     @LASTSAO2 (3)@    Physical Exam     Airway:     Mallampati: II  Mouth Opening: >2 FB  TM distance: > = 3 FB  Neck ROM: full    Dental:   - No obvious cracked, loose, chipped, or  missing teeth.     Pulmonary:       Breath sounds clear to auscultation.       Cardiovascular:     Rhythm: regular  (-) murmur, peripheral edema and weak pulses.    Neuro/Musculoskeletal/Psych:    Mental status: alert and oriented to person, place and time.    No sensory deficit.    Motor strength is normal.      Abdominal:     Not obese.  Abdomen: soft.  Current OB Status:       Other Findings:        Laboratory Data     Lab Results   Component Value Date    WBC 12.6 (H) 05/31/2017    HGB 14.8 05/31/2017    HCT 45.2 (H) 05/31/2017    MCV 84.9 05/31/2017    PLT 317 05/31/2017       No results found for: [facility]    Lab Results   Component Value Date    GLUCOSE 110 (H) 05/31/2017    BUN 10 05/31/2017    CO2 27 05/31/2017    CREATININE 0.84 05/31/2017    K 4.3 05/31/2017    NA 141 05/31/2017    CL 102 05/31/2017    CALCIUM 9.4 05/31/2017    ALBUMIN 4.2 10/28/2016    PROT 6.6 10/28/2016    ALKPHOS 110 10/28/2016    ALT 14 10/28/2016    AST 16 10/28/2016    BILITOT 0.3 10/28/2016       No results found for: PTT, INR    No results found for: PREGTESTUR, PREGSERUM, HCG, HCGQUANT    Anesthesia Plan     ASA 3         Female and current non-smoker    Anesthesia Type:  MAC.      PONV Risk Factors: female, current non-smoker,                  (MAC with propofol. Reports getting subq benadryl in past but not local anesthetics at site. Pretreatment with methylpred 125mg  + benadryl 50mg  IV + pepcid 40mg  IV per her usual regimen prior to rolling back. Discussed avoiding GA/intubation and the possibility of awareness/recall with MAC anesthesia. Will avoid narcotics due to reported triggering with narcotics in past. Tegaderm works well for dressings. No alcohol products on skin.)  Anesthetic plan and risks discussed with patient.    Plan, alternatives, and risks of anesthesia, including death, have been explained to and discussed with the patient/legal guardian.  By my assessment, the patient/legal guardian understands and agrees.   Scenario presented in detail.  Questions answered.    Blood products not discussed.      Plan discussed with resident.

## 2017-09-06 NOTE — TOC Discharge Planning (AHS/AVS) (Signed)
Anesthesia Transfer of Care Note    Patient: Emily Bonilla  Procedure(s) Performed: Procedure(s):  INSERTION POWER PORT A CATH    Patient location: Same Day Surgery    Anesthesia type: MAC    Airway Device on Arrival to PACU/ICU: Nasal Cannula    IV Access: Peripheral    Monitors Recommended to be Used During PACU/ICU: Standard Monitors    Outstanding Issues to Address: Pain Control    Level of Consciousness: awake, alert  and oriented    Post vital signs:    Vitals:    09/06/17 1513   BP: 130/68   Pulse: 98   Resp: 16   Temp: 97 F (36.1 C)   SpO2: 98%       Complications: None

## 2017-09-06 NOTE — Telephone Encounter (Signed)
Called Case Manager and left message regarding previous messages.

## 2017-09-06 NOTE — Unmapped (Signed)
1. You will have skin glue over the incision site. This will flake off over the next 2 weeks. There are no sutures on the skin, the ones that are internal will dissolve on their own  2. May shower with soap and water but no tub baths or swimming pool for 1 month  3. Monitor the port site for redness, drainage, swelling, pain, separation of the the incision, temperatures greater than 101.5 degrees. If this occurs, notify Surgical Oncology [phone] for any concerns.  4. Regular diet  5. Avoid heavy lifting or strenuous pushing/pulling for about 1 week  6. Can take over the counter tylenol or ibuprofen (as long as no history of stomach ulcers) for pain relief    General Anesthesia, Adult, Care After  These instructions provide you with information about caring for yourself after your procedure. Your health care provider may also give you more specific instructions. Your treatment has been planned according to current medical practices, but problems sometimes occur. Call your health care provider if you have any problems or questions after your procedure.  What can I expect after the procedure?  After the procedure, it is common to have:  ?? Vomiting.  ?? A sore throat.  ?? Mental slowness.  It is common to feel:  ?? Nauseous.  ?? Cold or shivery.  ?? Sleepy.  ?? Tired.  ?? Sore or achy, even in parts of your body where you did not have surgery.  Follow these instructions at home:  For at least 24 hours after the procedure:  ?? Do not:  ?? Participate in activities where you could fall or become injured.  ?? Drive.  ?? Use heavy machinery.  ?? Drink alcohol.  ?? Take sleeping pills or medicines that cause drowsiness.  ?? Make important decisions or sign legal documents.  ?? Take care of children on your own.  ?? Rest.  Eating and drinking  ?? If you vomit, drink water, juice, or soup when you can drink without vomiting.  ?? Drink enough fluid to keep your urine clear or pale yellow.  ?? Make sure you have little or no nausea before  eating solid foods.  ?? Follow the diet recommended by your health care provider.  General instructions  ?? Have a responsible adult stay with you until you are awake and alert.  ?? Return to your normal activities as told by your health care provider. Ask your health care provider what activities are safe for you.  ?? Take over-the-counter and prescription medicines only as told by your health care provider.  ?? If you smoke, do not smoke without supervision.  ?? Keep all follow-up visits as told by your health care provider. This is important.  Contact a health care provider if:  ?? You continue to have nausea or vomiting at home, and medicines are not helpful.  ?? You cannot drink fluids or start eating again.  ?? You cannot urinate after 8-12 hours.  ?? You develop a skin rash.  ?? You have fever.  ?? You have increasing redness at the site of your procedure.  Get help right away if:  ?? You have difficulty breathing.  ?? You have chest pain.  ?? You have unexpected bleeding.  ?? You feel that you are having a life-threatening or urgent problem.  This information is not intended to replace advice given to you by your health care provider. Make sure you discuss any questions you have with your health care provider.    Document Released: 04/12/2000 Document Revised: 06/09/2015 Document Reviewed: 12/19/2014  Elsevier Interactive Patient Education ?? 2018 [employer]vier Inc[employer]

## 2017-09-06 NOTE — Unmapped (Signed)
INTRA-OP POST BRIEFING NOTE: Marsella Fosberg      Specimens:     Prior to leaving the room: Nurse confirmed name of procedure, completion of instrument, sponge & needle counts, reads specimen labels aloud including patient name and addresses any equipment issues? Nurse confirmed wound class. Nurse to surgeon and anesthesia: What are key concerns for recovery and management of the patient?  Yes      Blood products stored at appropriate temperatures prior to return to blood bank (if applicable)? N/A      Patient identification band secured on patient prior to transfer out of the operating room? Yes    Temporary devices implanted for the duration of the surgery removed and evaluated for intactness and completeness prior to closure? N/A      Other Comments:     Signed: Reatha Armour    Date: 09/06/2017    Time: 3:04 PM

## 2017-09-06 NOTE — Unmapped (Signed)
Anesthesia Post Note    Patient: Emily Bonilla    Procedure(s) Performed: Procedure(s):  INSERTION POWER PORT A CATH    Anesthesia type: MAC    Patient location: Same Day Surgery    Post pain: Adequate analgesia    Post assessment: no apparent anesthetic complications, tolerated procedure well and no evidence of recall    Last Vitals:   Vitals:    09/06/17 1545 09/06/17 1600 09/06/17 1615 09/06/17 1700   BP: 130/72 137/81 (!) 153/94 (!) 130/97   BP Location: Left arm Left arm Left arm Left arm   Patient Position: Lying Lying Lying Lying   Pulse: 93 90 95 95   Resp: 16 16 16 16    Temp:    97.4 ??F (36.3 ??C)   TempSrc:    Temporal   SpO2: 97% 97% 97% 97%   Weight:       Height:            Post vital signs: stable    Level of consciousness: awake, alert  and oriented    Complications: None

## 2017-09-06 NOTE — Telephone Encounter (Signed)
Called [name] to obtain [employer] phone number and [employer] number/fax so paperwork can be sent for infusions.  Forwarded message to Mearl Latin for advice on having an aide at patients house following port placement.

## 2017-09-06 NOTE — Unmapped (Signed)
OPERATIVE NOTE    Emily Bonilla  [id number]    09/06/2017    PREOPERATIVE DIAGNOSIS:  Mast Cell Degranulation Syndrome     POSTOPERATIVE DIAGNOSIS:  Mast Cell Degranulation Syndrome     PROCEDURE:  1. Right internal jugular vein MediPort placement under ultrasound guidance utilizing fluoroscopy.      SURGEON:  Penni Bombard, MD     RESIDENT:  Synetta Shadow    ANESTHESIA:  Monitored Anesthesia Care (MAC)     INDICATIONS AND CONSENT:  A  57 y.o. 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 Presents for [employer] placement  We discussed preoperatively that because of her severe reaction, the patient cannot receive local and was reliant on IV medications.      INTRAOPERATIVE FINDINGS:  Normal right internal jugular venous anatomy     OPERATION IN DETAIL:  The patient was brought to the operating room and placed supine on the operating room table.  Monitored Anesthesia Care was established. Both arms were adequately passed and care taken to ensure no traction. The patient was given IV antibiotics. The neck, chest, abdomen and pelvis was prepped and draped in the standard surgical fashion. I performed a time-out procedure with the operating room personnel. We accessed the right internal jugular vein under ultrasound guidance and a guidewire was placed. The guidewire was confirmed in the superior vena cava utilizing fluoroscopy. We could not anesthetize the patient with local given her severe reaction. This was discussed with the patient beforehand. A small nick was made in the skin and inserted the dilator and introducer followed by a 6-French single-lumen catheter. The catheter was also confirmed in the superior vena cava utilizing fluoroscopy. We then created our port pocket. The catheter was tunneled into the port pocket, cut to the appropriate length, and secured to the port. The port aspirated and flushed well. We placed a [employer], 6-French,  single-lumen. The port was secured in the port pocket with two 3-0 Prolene sutures in the cephalad aspects of the port. The port continued to aspirate and flush well with heparinized saline. A final fluoroscopic image was obtained which showed good placement of the catheter. The wounds were irrigated, and the skin was closed with 3-0 Vicryl deep dermal sutures followed by 4-0 Monocryl and Dermabond for the skin.      A post-procedure CXR was obtained and revealed good positioning of the catheter with no pneumothorax.     ESTIMATED BLOOD LOSS:  Minimal.    COMPLICATIONS:  None

## 2017-09-07 MED ORDER — doxepin (SINEQUAN) 10 MG capsule
10 | ORAL_CAPSULE | ORAL | 11 refills | Status: AC
Start: 2017-09-07 — End: 2018-09-21

## 2017-09-07 MED ORDER — busPIRone (BUSPAR) 10 MG tablet
10 | ORAL_TABLET | Freq: Three times a day (TID) | ORAL | 0 refills | Status: AC
Start: 2017-09-07 — End: 2017-09-29

## 2017-09-07 MED ORDER — montelukast (SINGULAIR) 10 mg tablet
10 | ORAL_TABLET | ORAL | 11 refills | Status: AC
Start: 2017-09-07 — End: 2018-09-21

## 2017-09-07 NOTE — Telephone Encounter (Signed)
Received a phone call from lisa at [employer] [phone] inquiring if patient has ever had a reaction from any certain type/brand of IV fluids or tubing. Recommended Misty Stanley call patient and ask the patient as she is very aware of her allergies. Misty Stanley agreeable to this. Will route message to Mearl Latin for any additional orders/advice.

## 2017-09-07 NOTE — Telephone Encounter (Signed)
Called and spoke with Amalia Hailey, Pharmacy. Verified orders for LR as written by Mearl Latin. Clarified duration of hydration for 12 weeks per Mearl Latin.

## 2017-09-07 NOTE — Telephone Encounter (Signed)
Busken w/ [employer] called in requesting to know a verbal on IV order that was faxed over today. [employer] can be reached at: [phone] option 2, 2    Please see previously closed encounter on 09/07/17.

## 2017-09-07 NOTE — Telephone Encounter (Signed)
Faxed demographics, insurance card, meds&allergies, ht, wt, OV note and hydration orders to [employer] fax [phone] and [facility] [phone].

## 2017-09-07 NOTE — Telephone Encounter (Signed)
Called and left a message for [employer] case Production designer, theatre/television/film in regards to patients home infusions. Attempting to obtain Fax numbers for [employer] and [facility] [facility] so paperwork can be faxed to set up home IV hydration.

## 2017-09-07 NOTE — Unmapped (Signed)
Last visit 12/30/2016 Court Joy, MD  Future Appointments  Date Time Provider Department Center   09/14/2017 11:00 AM Biagio Borg, Ph.D. [facility]   09/29/2017 3:30 PM Shafi Lodhi [facility] PSY OP OP   10/05/2017 10:00 AM Loyal Jacobson, CNP [facility] MAB MAB   10/18/2017 10:50 AM Dareen Piano [facility] MAB MAB   12/08/2017 9:20 AM Ilona Dubuske V, DO [facility] ALG HOX HOX     Allergies and pharmacy verified.     Forwarding to provider for approval or denial

## 2017-09-07 NOTE — Telephone Encounter (Signed)
Last office visit 04/14/17  Next office visit 09/29/17    Pharmacy is requesting a refill for buspar

## 2017-09-08 MED ORDER — ranitidine (ZANTAC) 300 MG tablet
300 | ORAL_TABLET | Freq: Three times a day (TID) | ORAL | 11 refills | Status: AC
Start: 2017-09-08 — End: 2018-08-01

## 2017-09-08 NOTE — Telephone Encounter (Signed)
Mail order pharmacy calling for refill. Pt needs new Ix sent ASAP, waiting on this Rx to send pt mail order.

## 2017-09-08 NOTE — Unmapped (Signed)
Future Appointments  Date Time Provider Department Center   09/14/2017 11:00 AM Biagio Borg, Ph.D. [facility] INTE [facility]   09/29/2017 3:30 PM Shafi Lodhi [facility] PSY OP OP   10/05/2017 10:00 AM Loyal Jacobson, CNP [facility] MAB MAB   10/18/2017 10:50 AM Dareen Piano [facility] MAB MAB   12/08/2017 9:20 AM Ilona Dubuske V, DO [facility] ALG HOX HOX         Allergies   Allergen Reactions   ??? Acetaminophen Anaphylaxis     Unknown   ??? Aspirin Anaphylaxis     Unknown   ??? Benzoic Acid Anaphylaxis   ??? Blue Dye Anaphylaxis     Unknown   ??? Epinephrine Anaphylaxis   ??? Flecainide Shortness Of Breath     Itching, decreased libido, wheezing, flushing, numbness.    ??? Gabapentin Anaphylaxis   ??? Iodinated Contrast Media Anaphylaxis   ??? Isopropyl Alcohol Anaphylaxis   ??? Latex Anaphylaxis and Rash   ??? Lidocaine Anaphylaxis   ??? Nsaids (Non-Steroidal Anti-Inflammatory Drug) Anaphylaxis   ??? Onion Anaphylaxis   ??? 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   ??? Paraben Anaphylaxis   ??? Perfume Anaphylaxis   ??? Pregabalin Anaphylaxis   ??? Quetiapine Anaphylaxis     Mental Status Change   ??? Red Dye Anaphylaxis     Unknown   ??? Sulfa (Sulfonamide Antibiotics) Anaphylaxis   ??? Sulfur Anaphylaxis     Pt states: Any alcohol product   ??? Verapamil Anaphylaxis   ??? Yellow Dye Anaphylaxis     Unknown   ??? Green Dye Rash   ??? Onion Extract Other (See Comments)   ??? Amitriptyline    ??? Doxepin Other (See Comments)     Unknown   ??? Duloxetine      Other reaction(s): Other (See Comments)  Mental Status Change   ??? Epi E-Z Pen    ??? Hydroxyzine Itching     Unknown   ??? Levofloxacin Itching   ??? Montelukast    ??? Verapamil (Bulk) Hives     Insomnia, flushing, scattered brain, migraine,  itching, anxiety.    ??? Adhesive Tape-Silicones Itching, Rash and Swelling     Unknown   ??? Trazodone Rash

## 2017-09-14 ENCOUNTER — Ambulatory Visit: Admit: 2017-09-14 | Discharge: 2017-09-20 | Payer: PRIVATE HEALTH INSURANCE

## 2017-09-14 DIAGNOSIS — F439 Reaction to severe stress, unspecified: Secondary | ICD-10-CM

## 2017-09-14 NOTE — Unmapped (Signed)
In: 11am  Out: ??1155am  ??  Chief Issue: stress/anxiety about health??  ??  Subjective:   Pt discussed additional symptoms that she has from her disordered health. ??since last session has met with Dr. Valeda Malm multiple times as well as has connected with an endocrinologist and had a port put in.  Discovered that she was very deficient in B12 and Vit d, d/g with diabetes, and now getting her diabetes, and hyperparathyroid symptoms under control.  Energy is much better.  getting b12 shots every other day.  Is not napping during the day (was doing this up to 3 hours each day).  Psychologically, is feeling even more hopeful.  Gets depressed on occasion when she thinks about her old life and how different her life is currently.  Is feeling much more hopeful now though as she has been feeling much better.  Continuing to practice meditation exercises.  Will be calling someone at church to see if one of the parishioners can provide transport.  Very difficult for her to ask, but discussed the benefits of being there and allowing others to help her.    ??  Objective:   Very optimistic and positive on the outside especially with what she is managing health and financially.  MSE  Motor Behavior:??no psychomotor abnormalities  ??  Cognition: alert  ??  Attitude: cooperative  ??  Affect: tearful, appropriate to thought and anxious  ??  Appearance: appropriately dressed  ??  Speech: normal rate  ??  Mood:??depressed, consistent and anxious  ??  Thought Processes:??well organized and no looseness of association??  ??  Perceptions:??no hallucinations or other abnormalities????  ??  Thought content:??no delusions  ??  Insight/ judgement:??no impairments  ??  Suicidal ideation:??none  ??  Homicidal ideation:??none  ??  Symptoms observed during session:  Agitation,??anhedonia,??less anxiety/fear,????decreased energy/fatigue, mildly??depressed,??emotional lability, sleep disturbance - although slept very well last night - solidly for first time in months, Open coachable  motivated  ??  Assessment:   ??  Intervention strategies implemented and session focus was cognitive behavioral,??insight-oriented,??behavior modification,??supportive,??mindfulness strategies  ??  Plan:   ??  Homework:   Practice breathing techniques  Continuing physical exercise  Follow bedtime routine with consistency  Continue to seek out groups that she can potentially get involved in  ??  ??  Future treatment/follow-up issues:  Oct after meeting with [facility] and endocrinologist

## 2017-09-29 ENCOUNTER — Ambulatory Visit: Admit: 2017-09-29 | Discharge: 2017-09-29 | Payer: PRIVATE HEALTH INSURANCE | Attending: Psychiatry

## 2017-09-29 DIAGNOSIS — F419 Anxiety disorder, unspecified: Secondary | ICD-10-CM

## 2017-09-29 MED ORDER — busPIRone (BUSPAR) 10 MG tablet
10 | ORAL_TABLET | ORAL | 0 refills | Status: AC
Start: 2017-09-29 — End: 2017-12-19

## 2017-09-29 NOTE — Telephone Encounter (Signed)
Appointment is today    Pharmacy is requesting a refill for buspar

## 2017-09-29 NOTE — Telephone Encounter (Signed)
Appointment is today and pharmacy is asking for a refill of atarax

## 2017-09-29 NOTE — Progress Notes (Signed)
Visit Start Time: 3:45 PM   Visit Quit Time: 4:30 PM    No chief complaint on file.    Context:somatic symptoms  Location: Altered mental status of anxiety  Duration: chronic  Severity:mild  Associated Symptoms: anxiety  Modifying Factors:improving        History of Present Illness:   Emily Bonilla is a 57 y.o. female with PMH unspecified anxiety disorder, atrial fibrillation, WPW, idiopathic mast cell activation, anemia, asthma, hypothyroidism, GERD, intermittent chronic HA, PNES, presents for follow-up.    Last seen by Dr. Gerilyn Pilgrim. Current psychiatric medications: Buspirone 20 mg TID, Hydroxyzine 25 mg TID PRN     HPI     Anxiety has improved. No ED visits since last appointment. States her functional medicine doctor is amazing and has honed in on what's really going on. States she is better able to delineate her situational anxiety from GAD. Doing medication twice a day, shows me her meditation app. Very focused on her health and medical symptoms. Denies SI/HI/AVH.     GAD-7 (Oncology Visit Questionnaire) 08/26/2016 11/25/2016 02/03/2017 04/14/2017 09/29/2017   Feeling nervous, anxious or on edge: 3 1 1 1 1    Not being able to stop or control worrying: 0 1 0 0 0   Worrying too much about different things: 0 0 0 0 0   Trouble relaxing: 1 1 0 1 1   Being so restless that it is hard to sit still: 2 1 0 0 0   Becoming easily annoyed or irritable: 3 1 1 3 2    Feeling afraid as if something awful might happen: 1 1 0 1 0   GAD-7 Total Score 10 6 2 6 4    If you checked off any problems, how difficult have these problems made it for you to do your work, take care of things at home, or get along with other people? Very difficult Somewhat difficult Somewhat difficult Somewhat difficult Somewhat difficult     Patient Health Questionnaire (PHQ-9) 06/07/2017 09/29/2017   Little interest or pleasure in doing things 0 1   Feeling down, depressed, or hopeless 0 0   Trouble falling or staying asleep, or sleeping too much - 1    Feeling tired or having little energy - 3   Poor appetite or overeating - 1   Feeling bad about yourself - or that you are a failure or have let yourself or your family down - 0   Trouble concentrating on things, such as reading the newspaper or watching television - 1   Moving or speaking so slowly that other people could have noticed. Or the opposite - being so fidgety or restless that you have been moving around a lot more than usual - 0   Thoughts that you would be better off dead, or of hurting yourself in some way - 0   PHQ-9 Total Score - 7   If you checked off any problems, how difficult have these problems made it for you to do your work, take care of things at home, or get along with other people? - Somewhat difficult             Patient History:     Psychiatric History:     Prior Inpatient Psychiatric Hospitalization(s): denies  Prior Psychotropic Medications: reports taking prior psychotropic medications.  Current Psychotropic Medications: yes - see below current psychotropic medications.  SA: denies    Past Medical History:   Diagnosis Date    Anaphylaxis  Anemia     Anxiety     Asthma     Atrial fibrillation (CMS Dx)     Bronchitis     Heart disease     Neurological disease     OSA (obstructive sleep apnea)     states resolved    Osteoporosis     Psychogenic nonepileptic seizure     Seizures (CMS Dx)     Spontaneous pneumothorax     states pneumomediatsium/pneumopericardium, self resolved    Thyroid disease     Ulcer     WPW (Wolff-Parkinson-White syndrome)        Past Surgical History:   Procedure Laterality Date    BONE MARROW BIOPSY      benign    CARDIAC ELECTROPHYSIOLOGY MAPPING AND ABLATION      attempted x 2, states not successful either time    CHOLECYSTECTOMY  2015    DENTAL EXTRACTION Right 04/25/2017    Procedure: EXTRACTION TOOTH # 31, BRIDGE RESECTIONING;  Surgeon: Gladys Damme, DMD;  Location: [facility] OR;  Service: Oral Surgery;  Laterality: Right;    FACIAL COSMETIC  SURGERY      HYSTERECTOMY  2009    INSERTION CATHETER VASCULAR ACCESS N/A 09/06/2017    Procedure: INSERTION POWER PORT A CATH;  Surgeon: Penni Bombard, MD;  Location: [facility] OR;  Service: General;  Laterality: N/A;    LIVER BIOPSY      benign hepatic adednoma per patient    REMOVAL HARDWARE LEG Left 08/17/2016    Procedure: REMOVAL OF HARDWARE LEFT TIBIA;  Surgeon: Andee Lineman, MD;  Location: [facility] OR;  Service: Orthopedics;  Laterality: Left;    TIBIA FRACTURE SURGERY      TONSILLECTOMY      + adenoids       Social History     Social History    Marital status: Divorced     Spouse name: N/A    Number of children: N/A    Years of education: N/A     Social History Main Topics    Smoking status: Never Smoker    Smokeless tobacco: Never Used      Comment: Denies (11/15/2016)    Alcohol use No      Comment: Denies (11/15/2016)    Drug use: No      Comment: Denies (11/15/2016)    Sexual activity: Not Currently     Other Topics Concern    Caffeine Use Yes    Exercise No    Seat Belt Yes     Social History Narrative    None       Family History   Problem Relation Age of Onset    Kidney failure Mother     Aneurysm Mother        Review of Systems    Denies CP, SOB, nausea, vomiting    Allergies:   Acetaminophen; Aspirin; Benzoic acid; Blue dye; Epinephrine; Flecainide; Gabapentin; Iodinated contrast media; Isopropyl alcohol; Latex; Lidocaine; Nsaids (non-steroidal anti-inflammatory drug); Onion; Other; Paraben; Perfume; Pregabalin; Quetiapine; Red dye; Sulfa (sulfonamide antibiotics); Sulfur; Verapamil; Yellow dye; Green dye; Onion extract; Amitriptyline; Doxepin; Duloxetine; Epi e-z pen; Hydroxyzine; Levofloxacin; Montelukast; Verapamil (bulk); Adhesive tape-silicones; and Trazodone    Medications:     Outpatient Encounter Prescriptions as of 09/29/2017   Medication Sig Dispense Refill    blood sugar diagnostic Strp Use 1 test strip twice daily to check blood glucose 100 strip 2    blood-glucose meter Misc  Use meter to test blood glucose twice  daily 1 each 0    busPIRone (BUSPAR) 10 MG tablet Take 2 tablets (20 mg total) by mouth 3 times a day. 180 tablet 0    cetirizine (ZYRTEC) 10 mg Cap       diphenhydrAMINE (BENADRYL) 25 mg capsule Take 75-150 mg by mouth daily.      doxepin (SINEQUAN) 10 MG capsule TAKE ONE (1) CAPSULE BY MOUTH AT BEDTIME. 30 capsule 11    fexofenadine (ALLEGRA) 180 MG tablet Take 1 tablet (180 mg total) by mouth daily. 30 tablet 3    furosemide (LASIX) 20 MG tablet Take 1 tablet (20 mg total) by mouth daily. 90 tablet 3    hydrOXYzine HCl (ATARAX) 25 MG tablet TAKE ONE TABLET BY MOUTH 3 TIMES DAILY AS NEEDED FOR ANXIETY 90 tablet 1    lancets Misc Use on lancet twice daily to check blood glucose 200 each 1    levalbuterol (XOPENEX) 1.25 mg/3 mL nebulizer solution Inhale 3 mL (1.25 mg total) by nebulization every 6 hours as needed for Wheezing. 72 mL 0    levothyroxine (SYNTHROID, LEVOTHROID) 137 MCG tablet Take 1 tablet (137 mcg total) by mouth daily. 30 tablet 11    metFORMIN (GLUCOPHAGE) 500 MG tablet Take 1 tablet (500 mg total) by mouth 2 times a day with meals. 60 tablet 5    montelukast (SINGULAIR) 10 mg tablet TAKE ONE TABLET BY MOUTH AT BEDTIME. 30 tablet 11    nebulizer and compressor Devi Use 1 ampule as directed every 4 hours. Indications: Please dispense 1 nebulizer unit 1 each 0    onabotulinumtoxinA (BOTOX COSMETIC) 50 unit SolR Inject into the muscle.      predniSONE (DELTASONE) 20 MG tablet TAKE ONE TABLET BY MOUTH DAILY. 30 tablet 0    ranitidine (ZANTAC) 300 MG tablet Take 1 tablet (300 mg total) by mouth 3 times a day. 90 tablet 11    triamcinolone (KENALOG) 0.1 % ointment APPLY 2 TIMES DAILY 80 g 11    UNABLE TO FIND COMPOUNDED METHYLCOBALAMIN 1000 MCG EVERY OTHER DAY BY INJECTION/ TRISTATE 1000 mL 11    VITAMIN D3-VITAMIN K2, MK4, ORAL Take by mouth.      cholecalciferol, vitamin D3, (D3-50 CHOLECALCIFEROL) 50,000 unit capsule Take 1 capsule (50,000 Units  total) by mouth every 7 days. 30 capsule 0     No facility-administered encounter medications on file as of 09/29/2017.         Outpatient Medications Prior to Visit   Medication Sig Dispense Refill    blood sugar diagnostic Strp Use 1 test strip twice daily to check blood glucose 100 strip 2    blood-glucose meter Misc Use meter to test blood glucose twice daily 1 each 0    busPIRone (BUSPAR) 10 MG tablet Take 2 tablets (20 mg total) by mouth 3 times a day. 180 tablet 0    cetirizine (ZYRTEC) 10 mg Cap       diphenhydrAMINE (BENADRYL) 25 mg capsule Take 75-150 mg by mouth daily.      doxepin (SINEQUAN) 10 MG capsule TAKE ONE (1) CAPSULE BY MOUTH AT BEDTIME. 30 capsule 11    fexofenadine (ALLEGRA) 180 MG tablet Take 1 tablet (180 mg total) by mouth daily. 30 tablet 3    furosemide (LASIX) 20 MG tablet Take 1 tablet (20 mg total) by mouth daily. 90 tablet 3    hydrOXYzine HCl (ATARAX) 25 MG tablet TAKE ONE TABLET BY MOUTH 3 TIMES DAILY AS NEEDED FOR ANXIETY 90 tablet 1  lancets Misc Use on lancet twice daily to check blood glucose 200 each 1    levalbuterol (XOPENEX) 1.25 mg/3 mL nebulizer solution Inhale 3 mL (1.25 mg total) by nebulization every 6 hours as needed for Wheezing. 72 mL 0    levothyroxine (SYNTHROID, LEVOTHROID) 137 MCG tablet Take 1 tablet (137 mcg total) by mouth daily. 30 tablet 11    metFORMIN (GLUCOPHAGE) 500 MG tablet Take 1 tablet (500 mg total) by mouth 2 times a day with meals. 60 tablet 5    montelukast (SINGULAIR) 10 mg tablet TAKE ONE TABLET BY MOUTH AT BEDTIME. 30 tablet 11    nebulizer and compressor Devi Use 1 ampule as directed every 4 hours. Indications: Please dispense 1 nebulizer unit 1 each 0    onabotulinumtoxinA (BOTOX COSMETIC) 50 unit SolR Inject into the muscle.      predniSONE (DELTASONE) 20 MG tablet TAKE ONE TABLET BY MOUTH DAILY. 30 tablet 0    ranitidine (ZANTAC) 300 MG tablet Take 1 tablet (300 mg total) by mouth 3 times a day. 90 tablet 11     triamcinolone (KENALOG) 0.1 % ointment APPLY 2 TIMES DAILY 80 g 11    UNABLE TO FIND COMPOUNDED METHYLCOBALAMIN 1000 MCG EVERY OTHER DAY BY INJECTION/ TRISTATE 1000 mL 11    VITAMIN D3-VITAMIN K2, MK4, ORAL Take by mouth.      cholecalciferol, vitamin D3, (D3-50 CHOLECALCIFEROL) 50,000 unit capsule Take 1 capsule (50,000 Units total) by mouth every 7 days. 30 capsule 0     No facility-administered medications prior to visit.        Objective:   Physical Exam  Vitals:    09/29/17 1539   BP: 152/77   Pulse: 92   Temp: 98.5 F (36.9 C)       Gait and Stations: Normal    Mental Status Exam:   Appearance: middle-aged Caucasian female, appears stated age, good hygiene and grooming, fair eye contact, cooperative  Activity: no PMR or PMA, no abnormal movements noted  Speech: hyper-verbal and mildly pressured but easily able to be interrupted, at times loud,normal latency of response, fluent with no aphasia  Mood/Affect: good affect congruent with stated mood, full range, euthymic  Thought Process: organized, linear, goal-directed, no LOA or FOI  Thought Content: talks about feeling well but is very focused on her health symptoms, denies SI, does not express HI, no delusions expressed  Perceptions: does not appear to be internally preoccupied or responding to hallucinations  Attention: fair to conversation  Fund of Knowledge:average  Memory:appropriate to conversation  Insight/Judgement: fair/fair    PHQ-9 Scores:   PHQ Total Score 03/30/2016 05/21/2016 06/24/2016 08/26/2016   PHQ-9 Total Score 0 0 0 9       GAD7 Scores:   GAD7Total Score 08/26/2016 11/25/2016 02/03/2017 04/14/2017   GAD-7 Total Score 10 6 2 6           Assessment/Plan:     Diagnoses:  -Somatic symptom disorder  -Unspecified anxiety disorder, mild  -Cluster B personality traits (particularly histrionic)  -R/O Illness anxiety disorder  -R/O Factitious disorder    Her anxiety has significantly improved on the current medication regimen and with treatment through  integrative medicine.  Will continue current medication regimen given beneficial effects.      # Anxiety, mild, significantly improved:  -Continue Buspirone 20 mg TID for anxiety  -Continue Hydroxyzine 25 mg TID PRN for anxiety. Encouraged to try taking medication PRN as opposed to scheduled due to risk of cognitive  decline with long-term use of anti-histamine  -Continue treatment with integrative medicine for biofeedback, massage therapy and acupuncture  -Self-management: meditation, yoga  -RTC in 12 weeks    Discussed risks/benefits/side effects/alternatives to medications. Discussed the risk of cognitive decline with long-term use of anti-histamine. Patient expressed understanding of and agreement to the current treatment plan.     # Safety: The patient's imminent risk appears low, as she denies SI, does not express HI, no previous SA, future-oriented and goal-oriented to continue treatment. Patient expressed understanding of resources, including 911 and PES.    # Substance:  -No active issues    # Medical:  -Follow-up with PCP      Olga Millers, MD    Patient plan discussed and agreed upon by attending Dr. Thomasena Edis

## 2017-09-30 MED ORDER — hydrOXYzine HCl (ATARAX) 25 MG tablet
25 | ORAL_TABLET | ORAL | 0 refills | Status: AC
Start: 2017-09-30 — End: 2017-11-01

## 2017-10-05 ENCOUNTER — Encounter: Payer: PRIVATE HEALTH INSURANCE | Attending: Family

## 2017-10-12 ENCOUNTER — Ambulatory Visit: Admit: 2017-10-12 | Discharge: 2017-10-12 | Payer: PRIVATE HEALTH INSURANCE | Attending: Family

## 2017-10-12 DIAGNOSIS — E119 Type 2 diabetes mellitus without complications: Secondary | ICD-10-CM

## 2017-10-12 LAB — POC HEMOGLOBIN A1C: POC Hemoglobin A1C: 6.7 % (ref 4.0–5.6)

## 2017-10-12 LAB — POC GLU MONITORING DEVICE: POC Glucose Monitoring Device: 94 mg/dL (ref 70–100)

## 2017-10-12 NOTE — Unmapped (Signed)
The following HPI was reviewed and copied forward (with edits) from a note written by me Katha Cabal, CNP) on 06/29/2017 . I have reviewed and updated the history, physical exam, data, assessment, and plan of the note as detailed below so that it reflects my evaluation and management of the patient.     Subjective:   Emily Bonilla presents for the continued  evaluation and management of osteopenia which was diagnosed when patient had BMD testing that revealed osteopenia in 2010 had 2 fractures in leg two years ago after missing two steps and causing her to fall. ??  She is not currently being treated with calcium and vitamin D supplementation.  She is not currently being treated with any pharmacological medications for osteoporosis.   Prior [place]:  [place], dexa scanned ordered by her PCP and patient was advised last office visit to have completed and she has not done so.   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 and increases doses with flare ups (over the last three months has been on 3 rounds of steroids )  Fractures: Tib/Fib fracture 2 years ago from missing a step while ambulating down a flight of stairs   Smoker:  none  Alcohol:  none  Significant weight gain/loss: 57 lb weight gain since 2018; weighed 165 lb in 08/2016 and now 222 lb  Height loss: none  Hx of kidney stones: no  PMH of hypercalcemia: none  PMH of thyroid abnormalities: see thyroid HPI below  Menopause:  Hysterectomy 20063 age 57 years of age   Hx of HRT none   Calcium in diet + suppl:  No supplement and does not get adequate calcium diet daily   Vitamin D: see vitamin d HPI below  Dental health:  Every 6 months   Plan for dental procedures none planned   Family history of Osteoporosis:  Mother and maternal aunt    Hx of hip fracture none  Radiation to bone: none  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 She has started taking vitamin d 5000 units every other day and is tolerating well. Vitamin d improved to 08/2017.    + type II DM-  She was diagnosed with prediabetes 2 years ago which was being monitor and was working on diet and exercise to prevent further progression to type II DM, She had rapid weight gain over the last 6-8 months and A1C was found to be of 7.3% 06/09/2017. At last office visit she was started on Metformin 500 mg PO BID after last office visit for elevated A1C 7.3% she was not previously taking any medications for her diabetes. She is tolerating medication well.     Hypothyroidism/+ hashimoto's thyroiditis, Family hx of thyroid cancer mother, nephew, niece, and maternal aunt.   Symptoms at last office visit were: Fatigue, + compression symptoms of dysphagia, smothering sensation when laying flat, choking sensation and waking during middle of the night feeling like airway is compromised.  Followed with Dr Angelia Mould after last office  visit on 06/29/2017 who recommended observation and sonographic surveillance. She had   + TPO antibodies 05/2017, elevated TSH her dose of Levothyroxine was increased to 125 mcg PO once daily after last office visit. She had repeat labs 08/22/2017 which showed elevated TSH 7.40 and Free T4 0.94 and her dose of Levothyroxine was increased to 137 mcg PO once daily and she was advised to have repeat thyroid labs which are not due yet for another couple weeks. She states her muscle aches and fatigue have improved.     Allergies   Allergen Reactions   ??? Acetaminophen Anaphylaxis     Unknown   ??? Aspirin Anaphylaxis     Unknown   ??? Benzoic Acid Anaphylaxis    ??? Blue Dye Anaphylaxis     Unknown   ??? Epinephrine Anaphylaxis   ??? Flecainide Shortness Of Breath     Itching, decreased libido, wheezing, flushing, numbness.    ??? Gabapentin Anaphylaxis   ??? Iodinated Contrast Media Anaphylaxis   ??? Isopropyl Alcohol Anaphylaxis   ??? Latex Anaphylaxis and Rash   ??? Lidocaine Anaphylaxis   ??? Nsaids (Non-Steroidal Anti-Inflammatory Drug) Anaphylaxis   ??? Onion Anaphylaxis   ??? 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   ??? Paraben Anaphylaxis   ??? Perfume Anaphylaxis   ??? Pregabalin Anaphylaxis   ??? Quetiapine Anaphylaxis     Mental Status Change   ??? Red Dye Anaphylaxis     Unknown   ??? Sulfa (Sulfonamide Antibiotics) Anaphylaxis   ??? Sulfur Anaphylaxis     Pt states: Any alcohol product   ??? Verapamil Anaphylaxis   ??? Yellow Dye Anaphylaxis     Unknown   ??? Green Dye Rash   ??? Onion Extract Other (See Comments)   ??? Amitriptyline    ??? Doxepin Other (See Comments)     Unknown   ??? Duloxetine      Other reaction(s): Other (See Comments)  Mental Status Change   ??? Epi E-Z Pen    ??? Hydroxyzine Itching     Unknown   ??? Levofloxacin Itching   ??? Montelukast    ??? Verapamil (Bulk) Hives     Insomnia, flushing, scattered brain, migraine, itching, anxiety.    ??? Adhesive Tape-Silicones Itching, Rash and Swelling     Unknown   ??? Trazodone Rash     Current Outpatient Prescriptions   Medication Sig Dispense Refill   ??? blood sugar diagnostic Strp Use 1 test strip twice daily to check blood glucose 100 strip 2   ??? blood-glucose meter Misc Use meter to test blood glucose twice daily 1 each 0   ??? busPIRone (BUSPAR) 10 MG tablet TAKE 2 TABLETS BY MOUTH 3 TIMES DAILY 180 tablet 0   ??? cetirizine (ZYRTEC) 10 mg Cap      ???  cholecalciferol, vitamin D3, (D3-50 CHOLECALCIFEROL) 50,000 unit capsule Take 1 capsule (50,000 Units total) by mouth every 7 days. 30 capsule 0   ??? diphenhydrAMINE (BENADRYL) 25 mg capsule Take 75-150 mg by mouth daily.     ??? doxepin (SINEQUAN) 10 MG capsule TAKE ONE (1) CAPSULE BY MOUTH AT BEDTIME. 30 capsule 11   ??? fexofenadine (ALLEGRA) 180 MG tablet Take 1 tablet (180 mg total) by  mouth daily. 30 tablet 3   ??? furosemide (LASIX) 20 MG tablet Take 1 tablet (20 mg total) by mouth daily. 90 tablet 3   ??? hydrOXYzine HCl (ATARAX) 25 MG tablet TAKE ONE TABLET BY MOUTH 3 TIMES DAILY AS NEEDED FOR ANXIETY 90 tablet 0   ??? lancets Misc Use on lancet twice daily to check blood glucose 200 each 1   ??? levalbuterol (XOPENEX) 1.25 mg/3 mL nebulizer solution Inhale 3 mL (1.25 mg total) by nebulization every 6 hours as needed for Wheezing. 72 mL 0   ??? levothyroxine (SYNTHROID, LEVOTHROID) 137 MCG tablet Take 1 tablet (137 mcg total) by mouth daily. 30 tablet 11   ??? metFORMIN (GLUCOPHAGE) 500 MG tablet Take 1 tablet (500 mg total) by mouth 2 times a day with meals. 60 tablet 5   ??? montelukast (SINGULAIR) 10 mg tablet TAKE ONE TABLET BY MOUTH AT BEDTIME. 30 tablet 11   ??? nebulizer and compressor Devi Use 1 ampule as directed every 4 hours. Indications: Please dispense 1 nebulizer unit 1 each 0   ??? onabotulinumtoxinA (BOTOX COSMETIC) 50 unit SolR Inject into the muscle.     ??? predniSONE (DELTASONE) 20 MG tablet TAKE ONE TABLET BY MOUTH DAILY. 30 tablet 0   ??? ranitidine (ZANTAC) 300 MG tablet Take 1 tablet (300 mg total) by mouth 3 times a day. 90 tablet 11   ??? triamcinolone (KENALOG) 0.1 % ointment APPLY 2 TIMES DAILY 80 g 11   ??? UNABLE TO FIND COMPOUNDED METHYLCOBALAMIN 1000 MCG EVERY OTHER DAY BY INJECTION/ TRISTATE 1000 mL 11   ??? VITAMIN D3-VITAMIN K2, MK4, ORAL Take by mouth.       No current facility-administered medications for this visit.      Family History   Problem Relation Age of Onset   ??? Kidney failure Mother    ???  Aneurysm Mother      Past Surgical History:   Procedure Laterality Date   ??? BONE MARROW BIOPSY      benign   ??? CARDIAC ELECTROPHYSIOLOGY MAPPING AND ABLATION      attempted x 2, states not successful either time   ??? CHOLECYSTECTOMY  2015   ??? DENTAL EXTRACTION Right 04/25/2017    Procedure: EXTRACTION TOOTH # 31, BRIDGE RESECTIONING;  Surgeon: Gladys Damme, DMD;  Location: [facility] OR;  Service: Oral Surgery;  Laterality: Right;   ??? FACIAL COSMETIC SURGERY     ??? HYSTERECTOMY  2009   ??? INSERTION CATHETER VASCULAR ACCESS N/A 09/06/2017    Procedure: INSERTION POWER PORT A CATH;  Surgeon: Penni Bombard, MD;  Location: [facility] OR;  Service: General;  Laterality: N/A;   ??? LIVER BIOPSY      benign hepatic adednoma per patient   ??? REMOVAL HARDWARE LEG Left 08/17/2016    Procedure: REMOVAL OF HARDWARE LEFT TIBIA;  Surgeon: Andee Lineman, MD;  Location: [facility] OR;  Service: Orthopedics;  Laterality: Left;   ??? TIBIA FRACTURE SURGERY     ??? TONSILLECTOMY      + adenoids     Patient Active Problem List    Diagnosis Date Noted   ??? Type 2 diabetes mellitus without complication, without long-term current use of insulin (CMS Dx) 06/29/2017   ??? Hyperthyroidism 06/29/2017   ??? Weight gain 06/29/2017   ??? Vitamin D deficiency 06/29/2017   ??? Hyperparathyroidism (CMS Dx) 06/29/2017   ??? Obesity (BMI 30.0-34.9) 06/29/2017   ??? Poor venous access 06/29/2017   ??? CVID (common variable immunodeficiency) (CMS Dx)  06/06/2017   ??? Caries 04/08/2017   ??? Intractable seizures (CMS Dx) 10/28/2016   ??? Spells of decreased attentiveness    ??? Seizure (CMS Dx)    ??? Hypothyroidism due to Hashimoto's thyroiditis 08/05/2016   ??? History of asthma    ??? Painful orthopaedic hardware (CMS Dx) 06/24/2016   ??? Anxiety and depression 03/31/2016   ??? 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 Multiple allergies 03/30/2016   ??? Syncope and collapse 03/30/2016     Social History     Social History   ??? Marital status: Divorced     Spouse name: N/A   ???  Number of children: N/A   ??? Years of education: N/A     Occupational History   ??? Not on file.     Social History Main Topics   ??? Smoking status: Never Smoker   ??? Smokeless tobacco: Never Used      Comment: Denies (11/15/2016)   ??? Alcohol use No      Comment: Denies (11/15/2016)   ??? Drug use: No      Comment: Denies (11/15/2016)   ??? Sexual activity: Not Currently     Other Topics Concern   ??? Caffeine Use Yes   ??? Exercise No   ??? Seat Belt Yes     Social History Narrative   ??? No narrative on file       Review of Systems  ROS worksheet reviewed in detail with patient, see scanned paperwork. Patient encouraged to discuss non-Endocrine issues with PCP.    The following portions of the patient's history were reviewed and updated as appropriate: allergies, current medications, past family history, past medical history, past social history, past surgical history and problem list.    Objective:  LMP  (LMP Unknown)    VS for this office visit reviewed in EPIC   BP 138/77 HR 91  Physical Exam  Constitutional:Pt  is oriented to person, place, and time Patient appears well-developed and well-nourished.   Eyes: Conjunctivae and EOM are normal. No exophthalmos. No lid lag or lid retraction. No periorbital swelling.  Neck: Normal range of motion. Neck supple. No tracheal deviation present. + mild thyromegaly present left greater than right.  Carotid bruit not present. Nontender on palpation  Cardiovascular: Normal rate, regular rhythm and normal heart sounds. No gallop or friction rub, no murmur heard.  Pulmonary/Chest: Effort normal and breath sounds normal. No respiratory distress. No wheezes or  rales.   Neurological: Alert and oriented to person, place, and time. Normal reflexes. No tremor of outstretched hands.  Skin: Skin is warm and dry. No erythema.   Psychiatric: Normal mood and affect. Behavior is normal. Judgment and thought content normal.     Labs reviewed in EPIC and Care everywhere    Imaging Review  No Dexa for  review, advised patient to schedule appointment and last office visit and did not complete. Advised to have this done ASAP    Assessment/Plan:  Osteopenia-  history of osteopenia, risk factors include: long term steroid use, type II DM, hysterectomy, age, postmenopausal, family history, long term PPI use, vitamin D def, hypothyroidism  -advised patient to have Dexa completed ASAP  -found to have low vitamin d with high PTH, will correct vitamin d with high dose vitamin d therapy. Once vitamin d is corrected will have patient due blood work and 24 hour urine calcium, sodium, creatinine, and cortisol  -advised on getting  1200 mg of calcium via dietary or supplement   -advised on fall prevention  -advised on exercise to improve bone health     Vitamin D deficiency with elevated PTH likely related to vitamin d deficiency   -low vitamin d 05/2017  -tried starting on high dose vitamin d 16109 units weekly, RX sent to compound pharmacy to provide dye free capsule to help prevent severe allergic reaction patient was not able to tolerate this medication and has switched to vitamin d 5000 units every other day   -last vitamin d 08/25/2017 now 28.3 will have Dr Sinclair Grooms repeat with her additional workup at upcoming office visit 10/18/2017    Hypothyroidism/Hashimoto's thyroiditis  -TSH elevated 05/2017 on current dose LT4 112 mcg PO once daily and dose of Levothyroxine increased to 125 mcg PO once daily and repeat labs in 6 weeks. Repeat labs in 08/2017 showed elevated TSH 7.40 and dose of Levothyroxine was increased to 137 mcg PO once daily. Patient will have repeated labs in 2 weeks and adjust medication at that time as indicated  -complaint of compressive symptoms at last office visit and patient followed with  Dr Angelia Mould after last office visit.  -+family history of thyroid CA  -Per Dr Vertis Kelch office note: he recommended observation and sonographic surveillance versus surgical interventions    Weight gain  -patient has  had 57 lb weight gain since 08/2016.  -has had intermittent steroid doses due to mast cell activation syndrome  -has now progressed to type II DM  -I recommend to check 24 hour urine cortisol when 24 hour urine calcium is done and will let Dr Sinclair Grooms manage and do further workup   -patient scheduled with Dr Sinclair Grooms 10/18/2017 one of the endocrinologist for further work-up and management as her case is complex    Type II DM   -prediabetes for the last several years and most recent blood work 05/2017 showing elevated A1C 7.3% not currently taking any medications for diabetes  -advised to work on weight reduction, diet and activity to improve glycemic control  -glucose meter sent to pharmacy last visit and was advised to check blood glucose intermittently several times per week and write on log, bring log back to next office visit. No meter or log brought to office visit today for review  -Started on Metformin 500 mg PO BID after last office visit  -Repeat A1C 6.7% in office today  -Continue current regimen  -no lipids or microalbumin for review ordered today. + hx of hyperlipidemia and has not been able to tolerate statin therapy in the past. Will repeat labs     Vitamin b 12 deficiency  -low vit b12 05/2017  -started on b12 injection every other day in 06/2017  -Dr Valeda Malm managing    ??  Plan of care was discussed in detail with the patient who verbalized understanding and agreed.  Patient was given an opportunity to ask questions which were answered appropriately.  ??  The following items were considered in medical decision making:  Permanent chart problem/surgery list reviewed  Permanent chart chronic med/allergy list reviewed  Permanent chart social/family history reviewed  PFSH and Health screening (pt reported) reviewed  Review/order clinical lab test/results  Review/order radiology test/results  Review/order other diagnostic tests/interventions   Risk and benefits and treatment options discussed with patient.    Katha Cabal, MSN, CNP, FNP-BC    RTC as scheduled 10/18/2017    Will have patient schedule with one of the endocrinologist  for further work-up as her case is more complex (scheduled with Dr Diab's fellow 10/18/2017)

## 2017-10-12 NOTE — Unmapped (Addendum)
Have Bone Dexa scanned completed ASAP  Have repeat labs after visit with Dr Sinclair Grooms 10/18/2017    Return as scheduled 10/18/2017 1050

## 2017-10-18 ENCOUNTER — Inpatient Hospital Stay: Admit: 2017-10-18 | Payer: PRIVATE HEALTH INSURANCE

## 2017-10-18 ENCOUNTER — Encounter: Admit: 2017-10-18 | Payer: PRIVATE HEALTH INSURANCE | Attending: "Endocrinology

## 2017-10-18 DIAGNOSIS — Z13820 Encounter for screening for osteoporosis: Secondary | ICD-10-CM

## 2017-10-18 NOTE — Unmapped (Signed)
This encounter was created in error - please disregard.

## 2017-10-18 NOTE — Unmapped (Signed)
I have reviewed bone Dexa scan. Patient has an appointment with Dr Diab's fellow scheduled for this morning 10/18/2017 and follow-up with Dr Blake Divine on 11/10/2017. I will let one of the endocrinologist discuss Dexa results and further plan of care for bone health.   Katha Cabal, MSN, CNP, FNP-BC

## 2017-10-24 ENCOUNTER — Encounter: Payer: PRIVATE HEALTH INSURANCE | Attending: Family

## 2017-11-02 MED ORDER — busPIRone (BUSPAR) 10 MG tablet
10 | ORAL_TABLET | ORAL | 0 refills | Status: AC
Start: 2017-11-02 — End: 2018-01-26

## 2017-11-02 MED ORDER — hydrOXYzine HCl (ATARAX) 25 MG tablet
25 | ORAL_TABLET | ORAL | 0 refills | Status: AC
Start: 2017-11-02 — End: 2017-12-22

## 2017-11-03 NOTE — Unmapped (Signed)
Emily Bonilla hha called in requesting to inform physician that she has renewed pt's hydration therapy for 60 days.

## 2017-11-10 ENCOUNTER — Ambulatory Visit: Admit: 2017-11-10 | Discharge: 2017-11-10 | Payer: PRIVATE HEALTH INSURANCE | Attending: "Endocrinology

## 2017-11-10 ENCOUNTER — Other Ambulatory Visit: Admit: 2017-11-10 | Payer: PRIVATE HEALTH INSURANCE

## 2017-11-10 DIAGNOSIS — E063 Autoimmune thyroiditis: Secondary | ICD-10-CM

## 2017-11-10 DIAGNOSIS — E038 Other specified hypothyroidism: Secondary | ICD-10-CM

## 2017-11-10 LAB — T3: T3, Total: 109.3 ng/dL (ref 60.0–220.0)

## 2017-11-10 LAB — FOLLICLE STIMULATING HORMONE: FSH: 35.4 m[IU]/mL

## 2017-11-10 LAB — TSH: TSH: 3.82 u[IU]/mL (ref 0.45–4.12)

## 2017-11-10 LAB — VITAMIN D 25 HYDROXY: Vit D, 25-Hydroxy: 37.7 ng/mL (ref 30.0–100.0)

## 2017-11-10 LAB — RENAL FUNCTION PANEL W/EGFR
Albumin: 4.5 g/dL (ref 3.5–5.7)
Anion Gap: 16 mmol/L (ref 3–16)
BUN: 10 mg/dL (ref 7–25)
CO2: 22 mmol/L (ref 21–33)
Calcium: 9.5 mg/dL (ref 8.6–10.3)
Chloride: 104 mmol/L (ref 98–110)
Creatinine: 0.77 mg/dL (ref 0.60–1.30)
Glucose: 108 mg/dL (ref 70–100)
Osmolality, Calculated: 294 mOsm/kg (ref 278–305)
Phosphorus: 4.1 mg/dL (ref 2.1–4.7)
Potassium: 4 mmol/L (ref 3.5–5.3)
Sodium: 142 mmol/L (ref 133–146)
eGFR AA CKD-EPI: >90 See note.
eGFR NONAA CKD-EPI: 86 See note.

## 2017-11-10 LAB — T4, FREE: Free T4: 1.18 ng/dL (ref 0.61–1.76)

## 2017-11-10 LAB — LUTEINIZING HORMONE: LH: 19.2 m[IU]/mL

## 2017-11-10 LAB — PTH, INTACT: PTH: 79 pg/mL (ref 12.0–88.0)

## 2017-11-10 LAB — ESTRADIOL (SENSITIVE): Estradiol: <20 pg/mL

## 2017-11-10 NOTE — Unmapped (Signed)
Patient ID:  57 y.o. female who presents for the continued  evaluation and management of osteopenia, diabetes and hypothyroidism    She presents with a home health aid.    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       She reports being diagnosed with osteopenia in 2010 on outside DXA scan. Her fracture history includes a fractured tibia and fibula from falling down 2 steps after passing out.  She has not previously been treated with osteoporosis medications.  She denies having history of kidney stones, hyper or hypocalcemia.  Her mother and maternal aunt both have osteoporosis.  She had hysterectomy at age 74 for menorrhagia.     Labs on 06/02/17 showed vitamin D=10.5 and PTH=151pg/mL on 06/02/2017.  She was prescribed ergocalciferol but could not tolerate and was switched to vitamin D3 5000units daily.  Vitamin D=28.3 on 08/25/17.    She was diagnosed with prediabetes in 2017, but has rapid weight gain in early 2019 and progressed to Type 2 diabetes with A1c=7.3% on 06/09/17.  So she was started on metformin 500mg  BID.    She also has a history of hypothyroidism d/t hashimoto's disease on levothyroxine.   She saw Dr Angelia Mould on 06/29/2017, who performed in-office ultrasound that showed heterogenous thyroid with not discrete nodule, c/w thyroiditis.     Interval history:    Since her last appt. 10/12/17 with Katha Cabal.  She had labs on 08/25/17 that showed TSH=7.4uIU/mL and FT4=0.94ng/dL, and her levothyroxine was increased from to daily.     Her energy level has improved with B-12 shots, but still has bad days.  She endorse having  muscle and joint pain.     DXA scan on 10/18/17 showed lowest T-score of -1.8 at the femoral neck.     She was treated with high dose corticosteroids 2 years ago, but in past year only 1 dose IV for cataract surgery, and  most most recent corticosteroids was prednisone 20mg  last week for 3 days.  Her last IVIG was summer 2017.    She is taking Metformin 500mg  BID but complains of a lot of bloating.   Her A1c=6.7% on 10/12/17.     She is taking vitamin D3 5000units every other day.  She does not take any calcium supplement, and consumes 1-2 serving of calcium rich foods.    Her weight has stabilized, but has not been able to lose it.     PMH:  - Mast cell activation syndrome  - CVID  - osteopenia  - hypothyroidism d/t hashimotos's  - Type 2 diabetes mellitus    Family Hx:  Mom- thyroid cancer, hypothyroid, mastocytosis  Niece- thyroid cancer, hypothyroid  Nephew- thyroid cancer, hypothyroid  Brother -  Type 1 diabetes  Maternal aunt - mastoctytosis    Social Hx:  - lives in [place], [place]  - No smoking, EtOH or illicit drugs    Current Outpatient Medications   Medication Sig Dispense Refill   ??? blood sugar diagnostic Strp Use 1 test strip twice daily to check blood glucose 100 strip 2   ??? blood-glucose meter Misc Use meter to test blood glucose twice daily 1 each 0   ??? busPIRone (BUSPAR) 10 MG tablet TAKE 2 TABLETS BY MOUTH 3 TIMES DAILY 180 tablet 0   ??? busPIRone (BUSPAR) 10 MG tablet TAKE 2 TABLETS  BY MOUTH 3 TIMES DAILY 180 tablet 0   ??? cetirizine (ZYRTEC) 10 mg Cap      ??? cholecalciferol, vitamin D3, (D3-50 CHOLECALCIFEROL) 50,000 unit capsule Take 1 capsule (50,000 Units total) by mouth every 7 days. 30 capsule 0   ??? diphenhydrAMINE (BENADRYL) 25 mg capsule Take 75-150 mg by mouth daily.     ??? doxepin (SINEQUAN) 10 MG capsule TAKE ONE (1) CAPSULE BY MOUTH AT BEDTIME. 30 capsule 11   ??? fexofenadine (ALLEGRA) 180 MG tablet Take 1 tablet (180 mg total) by mouth daily. 30 tablet 3   ??? furosemide (LASIX) 20 MG tablet Take 1 tablet (20 mg total) by mouth daily. 90 tablet 3   ??? hydrOXYzine HCl (ATARAX) 25 MG tablet TAKE ONE TABLET BY MOUTH 3 TIMES DAILY AS NEEDED FOR ANXIETY 90 tablet 0   ??? lancets Misc Use on lancet twice daily to check  blood glucose 200 each 1   ??? levalbuterol (XOPENEX) 1.25 mg/3 mL nebulizer solution Inhale 3 mL (1.25 mg total) by nebulization every 6 hours as needed for Wheezing. 72 mL 0   ??? levothyroxine (SYNTHROID, LEVOTHROID) 137 MCG tablet Take 1 tablet (137 mcg total) by mouth daily. 30 tablet 11   ??? metFORMIN (GLUCOPHAGE) 500 MG tablet Take 1 tablet (500 mg total) by mouth 2 times a day with meals. 60 tablet 5   ??? montelukast (SINGULAIR) 10 mg tablet TAKE ONE TABLET BY MOUTH AT BEDTIME. 30 tablet 11   ??? nebulizer and compressor Devi Use 1 ampule as directed every 4 hours. Indications: Please dispense 1 nebulizer unit 1 each 0   ??? onabotulinumtoxinA (BOTOX COSMETIC) 50 unit SolR Inject into the muscle.     ??? predniSONE (DELTASONE) 20 MG tablet TAKE ONE TABLET BY MOUTH DAILY. 30 tablet 0   ??? ranitidine (ZANTAC) 300 MG tablet Take 1 tablet (300 mg total) by mouth 3 times a day. 90 tablet 11   ??? triamcinolone (KENALOG) 0.1 % ointment APPLY 2 TIMES DAILY 80 g 11   ??? UNABLE TO FIND COMPOUNDED METHYLCOBALAMIN 1000 MCG EVERY OTHER DAY BY INJECTION/ TRISTATE 1000 mL 11   ??? VITAMIN D3-VITAMIN K2, MK4, ORAL Take by mouth.       No current facility-administered medications for this visit.      Review of Systems  ROS worksheet reviewed in detail with patient, see scanned paperwork. Patient encouraged to discuss non-Endocrine issues with PCP.    Objective:  BP 141/66    Pulse 85    Ht 5' 6 (1.676 m)    Wt 218 lb (98.9 kg)    LMP  (LMP Unknown)    BMI 35.19 kg/m??      Physical Exam  General: Obese caucasian female, well-developed and well-nourished.   HEENT: Conjunctivae and EOM are normal. No exophthalmos. No lid lag or lid retraction. No periorbital swelling.  Neck: Nontender,  mild thyromegaly; no palpable thyroid nodule; + dorsocervical fat  Cardiovascular: RRR; normal heart sounds.   Pulmonary: CTAB. No respiratory distress. No wheezes or  rales.   Neurological: Alert and oriented to person, place, and time. Normal reflexes. No  tremor of outstretched hands.  Abdomen: Obese, no violaceous stria  Skin: Skin is warm and dry. No erythema.   Psychiatric: Normal mood and affect. Behavior is normal. Judgment and thought content normal.     Labs reviewed in [facility] and Care everywhere    Imaging Review  No Dexa for review, advised patient to schedule appointment and last  office visit and did not complete. Advised to have this done ASAP    Assessment/Plan:  57 y.o. female with osteopenia, hypothyroidism, and Type 2 diabetes.    Ms. Lackman has bone density in the osteopenic range.  She has multiple risk factors for low bone density including, sex, corticosteroid use, secondary hyperparathyroidism, and family history.  It is not clear if she is post menopausal, because she has a hysterectomy.  Therefore we will check Estradiol, LH and FSH levels.      She was previously found to have low vitamin D and elevated PTH.  She could not tolerate ergocalciferol and has be replete with cholecalciferol.  We will check a renal panel, vitamin D and PTH levels.      She also has hypothyroidism d/t hashimoto's disease and have been treated with levothyroxine.  Her levothyroxine was recently increased to daily and she has not yet had TFTs. So therefore we will check TSH, FT4, and TT3.  It is unlikely that her rapid weight gain is related to her hypothyroidism, since her TSH was only mildly elevated.  It may be related to menopause.  There is low suspicion of Cushings Syndrome, but we will screen with midnight salivary cortisol.     She has excellent glycemic control as indicated by her A1c=6.7%.  Therefore we will continue her current regimen of Metformin 500mg  BID.       - check Estradiol, LH and FSH levels.  - check a renal panel, vitamin D and PTH levels.  - check TSH, FT4, and TT3  - check midnight salivary cortisol  - 1200mg  of calcium daily form diet and supplements combined  - continue levothyroxine daily, for now  - check a midnight salivary  cortisol on 2 separate nights    Follow-up in 3 months.    Medical Decision Making:  Total time spent with patient was >60 minutes.  Greater than 50% of that time was spent counseling about the diagnosis and management of the patient.  Please see my assessment and plan for details.

## 2017-11-10 NOTE — Unmapped (Addendum)
-   Please go to the lab to check calcium, parathyroid, vitamin D and thyroid.    - Take 1200mg  of calcium daily form diet and supplements combined     -continue levothyroxine daily, for now    Please check a midnight salivary cortisol on 2 separate nights    Follow-up in 3 months.

## 2017-11-18 MED ORDER — metFORMINGLUCOPHAGE500MGtablet
500 | ORAL_TABLET | ORAL | 11 refills | Status: AC
Start: 2017-11-18 — End: 2018-11-28

## 2017-11-18 MED ORDER — cromolyn (GASTROCROM) 100 mg/5 mL solution
100 | ORAL | 0 refills | 24.00000 days | Status: AC
Start: 2017-11-18 — End: 2018-01-14

## 2017-11-21 NOTE — Unmapped (Signed)
Deniece Portela w/[facility] asked for update on peer to peer for home care?[name] also asked to discuss POC and can be reached at [phone]

## 2017-11-22 NOTE — Telephone Encounter (Signed)
[facility] called regarding previous encounter and asked to speak to nurse as soon as possible.[name] can be reached at [phone]

## 2017-11-22 NOTE — Telephone Encounter (Signed)
Received a call back from [place] who states Dr. Lourdes Sledge does not have to do a Peer to Peer, it will go thru [facility]. No further assistance needed.

## 2017-11-22 NOTE — Telephone Encounter (Signed)
Deniece Portela from [facility] calling and stating that [facility] who is doing home care from  August-October, 2019 stating they are unable to do her home care anymore due to the changing of her Medicare to Medicaid.. Patient to have eye surgery  this Thursday and will require Port access with IV fluids before and after and teaching. [name] from [facility] is requesting a Peer to peer and asking Dr. Lourdes Sledge to call Dr. Charlett Lango the medical director at [facility]. She does not have the phone number but will call me back with the direct phone number.

## 2017-11-30 NOTE — Unmapped (Signed)
Emily Bonilla w/[facility] called to request if infusion orders and referral be faxed to [facility], Fax# [phone] attn Toni Amend

## 2017-12-01 NOTE — Telephone Encounter (Signed)
Refaxed referral home [facility] to [phone].  Orders are for Lactated Ringers IV over 2 hours once a day for 2 days each week. For 6 months.    Have skilled nurse access and deaccess port as needed for infusions and prn.  [facility] will deliver supplies as patient is already an established patient with them. Confirmed with patient.

## 2017-12-01 NOTE — Unmapped (Addendum)
December 01, 2017 2:36 PM    Robin with [facility] called to request the following order:     ?? Lactating Ringer    Please specify:    ?? Dispense quantity    ?? What the skilled nurse will need to provide    ?? From whom and when will IV supplies be delivered .    Please fax order and instructions  to #[phone]    [facility]:# [phone] extension [phone]

## 2017-12-01 NOTE — Telephone Encounter (Signed)
Referral for [facility] , Nursing and IV fluids faxed to [facility] at [phone].

## 2017-12-01 NOTE — Telephone Encounter (Signed)
After several attempts, I was able to reach [facility] and informed her that [facility] orders will be faxed to [facility] as requested.

## 2017-12-02 NOTE — Telephone Encounter (Signed)
Called [facility] and verified that they received the orders for patient.

## 2017-12-06 ENCOUNTER — Ambulatory Visit: Admit: 2017-12-06 | Discharge: 2017-12-13 | Payer: PRIVATE HEALTH INSURANCE

## 2017-12-06 DIAGNOSIS — F439 Reaction to severe stress, unspecified: Secondary | ICD-10-CM

## 2017-12-06 NOTE — Unmapped (Signed)
In: 2pm  Out: ??255pm  ??  Chief Issue: stress/anxiety about health??  ??  Subjective:   Pt discussed additional symptoms that she has from her disordered health. ??Since our last visit has been doing much better physically due to the work she has accomplished through Dr. Valeda Malm.  Her aide has also been steady - it is someone she had experienced previously and she has been very good for her.  Getting botox again next month.  Sleeping well.  Had two cataract surgeries since last session.  Having a new crown (currently cracked tooth and no crown) on a tooth this Thursday.   Neck pain - will be trying acupuncture for this within integrative medicine.  Diabetes under good control.  Feeling much clearer, less emotional. Discussed sister and mother moving to a home in [place], so they will be closer.  Discussed daugther and her husband's stress - husband not seeking employment, daughter working two jobs. discussed getting involved with family over the holidays.  Will likely see family during christmas break.  Reached out to [employer] and they said they would come by to visit when she wants and eventually be able to help her at [employer].  Doing well with friends.    ??  Objective:   Very optimistic and positive on the outside especially with what she is managing health and financially.  MSE  Motor Behavior:??no psychomotor abnormalities  ??  Cognition: alert  ??  Attitude: cooperative  ??  Affect: tearful, appropriate to thought and anxious  ??  Appearance: appropriately dressed  ??  Speech: normal rate  ??  Mood:??depressed, consistent and anxious  ??  Thought Processes:??well organized and no looseness of association??  ??  Perceptions:??no hallucinations or other abnormalities????  ??  Thought content:??no delusions  ??  Insight/ judgement:??no impairments  ??  Suicidal ideation:??none  ??  Homicidal ideation:??none  ??  Symptoms observed during session:  Agitation,??anhedonia,??less anxiety/fear,????decreased energy/fatigue,  mildly??depressed,??emotional lability, sleep disturbance - although slept very well last night - solidly for first time in months, Open coachable motivated  ??  Assessment:   ??  Intervention strategies implemented and session focus was cognitive behavioral,??insight-oriented,??behavior modification,??supportive,??mindfulness strategies  ??  Plan:   ??  Homework:   Practice breathing techniques  Continuing physical exercise  Follow bedtime routine with consistency  Continue to seek out groups that she can potentially get involved in  Seeking acupuncture  ??  ??  Future treatment/follow-up issues:  Dec nandyal.

## 2017-12-08 ENCOUNTER — Ambulatory Visit: Admit: 2017-12-08 | Discharge: 2017-12-13 | Payer: PRIVATE HEALTH INSURANCE

## 2017-12-08 DIAGNOSIS — J4520 Mild intermittent asthma, uncomplicated: Secondary | ICD-10-CM

## 2017-12-08 NOTE — Unmapped (Signed)
ALLERGY & IMMUNOLOGY Clinic Note     CC: Follow up for asthma and other issues    HPI: Emily Bonilla is a 57 y.o. female with history of mild intermittent asthma and reported mast cell disorder who presents to the allergy clinic today for a follow up visit, accompanied by her personal aide.     Update on overall health: since last visit, [name] has been following with Integrative Medicine specialist, had an power port placed through which she has been getting LR infusions 2 days on, 5 days off for hydration purposes and she feels great improvement in her overall health.    Reported Mast Cell Disorder:  We were not able to get any laboratory confirmation or previous records to support this diagnosis. She reports nearly resolution of her symptoms. She denies having hives, flushing, itching, respiratory symptoms and GI symptoms. She reports seeing Dr. Hulen Skains about 20 years (1996) and was told that she has mast cell disorder. She is currently taking Doxepin 10 mg daily, Allegra 180 mg at bedtime, Zyrtec 10 mg daily in AM, Singulair 10 mg at bedtime, Hydraxyzine 25 mg TID, which control her symptoms well.    Mild Intermittent Asthma:  She has not used any inhalers for over 2 years now. She denies any symptoms of wheezing, coughing, nighttime symptoms. No recent steroid use, visits to ED or UC.    Reported History of CVID:  Reports diagnosis of CVID with frequent infections several years ago before she moved to [place]. Used to get IVIG in the past but not in the recent years. Has not had any recent infections and her recent IgG levels have been normal:          Review of Systems:  GENERAL: denies unintended weight changes, fever or fatigue   EYES: denies changes in vision, watery or itchy eyes   ENT: denies changes in hearing or dental problems   CARDIOVASCULAR: denies chest pain, palpitations or edema  LUNGS: denies shortness of breath, wheezing, cough or difficulty breathing   ABDOMEN: denies abdominal  pain, nausea, vomiting or diarrhea  GU: denies dysuria or hematuria  HEME/ONC: denies abnormal bleeding or bruising   MSK: denies muscle weakness, joint stiffness or joint pain   ENDO: denies heat or cold intolerance  NEURO: denies seizures, headaches or focal neurological deficits  PSYCH: denies anxiety or depression   SKIN: denies rash and pruritus      Past Medical History:  Past Medical History:   Diagnosis Date   ??? Anaphylaxis    ??? Anemia    ??? Anxiety    ??? Asthma    ??? Atrial fibrillation (CMS Dx)    ??? Bronchitis    ??? Heart disease    ??? Neurological disease    ??? OSA (obstructive sleep apnea)     states resolved   ??? Osteoporosis    ??? Psychogenic nonepileptic seizure    ??? Seizures (CMS Dx)    ??? Spontaneous pneumothorax     states pneumomediatsium/pneumopericardium, self resolved   ??? Thyroid disease    ??? Ulcer    ??? WPW (Wolff-Parkinson-White syndrome)        Past Surgical History:  Past Surgical History:   Procedure Laterality Date   ??? BONE MARROW BIOPSY      benign   ??? CARDIAC ELECTROPHYSIOLOGY MAPPING AND ABLATION      attempted x 2, states not successful either time   ??? CHOLECYSTECTOMY  2015   ??? DENTAL EXTRACTION Right  04/25/2017    Procedure: EXTRACTION TOOTH # 31, BRIDGE RESECTIONING;  Surgeon: Gladys Damme, DMD;  Location: [facility] OR;  Service: Oral Surgery;  Laterality: Right;   ??? FACIAL COSMETIC SURGERY     ??? HYSTERECTOMY  2009   ??? INSERTION CATHETER VASCULAR ACCESS N/A 09/06/2017    Procedure: INSERTION POWER PORT A CATH;  Surgeon: Penni Bombard, MD;  Location: [facility] OR;  Service: General;  Laterality: N/A;   ??? LIVER BIOPSY      benign hepatic adednoma per patient   ??? REMOVAL HARDWARE LEG Left 08/17/2016    Procedure: REMOVAL OF HARDWARE LEFT TIBIA;  Surgeon: Andee Lineman, MD;  Location: [facility] OR;  Service: Orthopedics;  Laterality: Left;   ??? TIBIA FRACTURE SURGERY     ??? TONSILLECTOMY      + adenoids       Social History:  Tobacco use: none  Alcohol use: none    Allergies:  Allergies   Allergen Reactions   ???  Acetaminophen Anaphylaxis     Unknown   ??? Aspirin Anaphylaxis     Unknown   ??? Benzoic Acid Anaphylaxis   ??? Blue Dye Anaphylaxis     Unknown   ??? Epinephrine Anaphylaxis   ??? Flecainide Shortness Of Breath     Itching, decreased libido, wheezing, flushing, numbness.    ??? Gabapentin Anaphylaxis   ??? Iodinated Contrast Media Anaphylaxis   ??? Isopropyl Alcohol Anaphylaxis   ??? Latex Anaphylaxis and Rash   ??? Lidocaine Anaphylaxis   ??? Nsaids (Non-Steroidal Anti-Inflammatory Drug) Anaphylaxis   ??? Onion Anaphylaxis   ??? 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   ??? Paraben Anaphylaxis   ??? Perfume Anaphylaxis   ??? Pregabalin Anaphylaxis   ??? Quetiapine Anaphylaxis     Mental Status Change   ??? Red Dye Anaphylaxis     Unknown   ??? Sulfa (Sulfonamide Antibiotics) Anaphylaxis   ??? Sulfur Anaphylaxis     Pt states: Any alcohol product   ??? Verapamil Anaphylaxis   ??? Yellow Dye Anaphylaxis     Unknown   ??? Green Dye Rash   ??? Onion Extract Other (See Comments)   ??? Amitriptyline    ??? Doxepin Other (See Comments)     Unknown   ??? Duloxetine      Other reaction(s): Other (See Comments)  Mental Status Change   ??? Epi E-Z Pen    ??? Hydroxyzine Itching     Unknown   ??? Levofloxacin Itching   ??? Montelukast    ??? Verapamil (Bulk) Hives     Insomnia, flushing, scattered brain, migraine, itching, anxiety.    ??? Adhesive Tape-Silicones Itching, Rash and Swelling     Unknown   ??? Trazodone Rash       Physical Exam:   Blood pressure 138/66, pulse 94, temperature 98.6 ??F (37 ??C), temperature source Oral, height 5' 7 (1.702 m), weight 215 lb (97.5 kg), SpO2 98 %, peak flow 340 L/min.  GENERAL APPEARANCE: Well developed, well nourished, in no acute distress, alert and  oriented.  SKIN: Inspection of the skin reveals no rashes, ulcerations or petechia. Healing scar on L lower leg from titanium removal.  HEENT: Normal turbinates, no evidence of swelling or rhinorrhea. No masses,  polyps or pus. No septal perforation.  NECK: Supple and symmetric.  LUNGS: Auscultation of the lungs revealed normal breath sounds, no wheezes or crackles.  CARDIOVASCULAR: Regular rate and rhythm without any murmurs, rubs or gallops.  ABDOMEN: Soft and nontender with normal bowel sounds.  LYMPH NODES:  No lymphadenopathy.  EXTREMITIES:  No cyanosis, clubbing or edema.  NEUROLOGIC: No focal neurological deficits noted.    Assessment and Plan:    1. Reported Mast Cell Disorder: Currently her symptoms are well controlled  -- We were not able to get any laboratory confirmation or previous records to support this diagnosis  -- Continue Doxepin 10 mg daily  -- Continue Allegra 180 mg at bedtime  -- Continue Zyrtec 10 mg daily in AM  -- Continue Singulair 10 mg at bedtime  -- Continue Hydraxyzine 25 mg TID    2. Mild Intermittent Asthma: controlled  -- She is symptom free  -- Not using any inhalers now    3. 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 Harley Hallmark who is in agreement with the above plan.    Follow up: 6-8 months    Matilde Sprang, D.O.  Allergy and Immunology Fellow

## 2017-12-08 NOTE — Unmapped (Signed)
Made future 8  months follow up appointment. Patient notified to arrive 15 minutes early. Patient received AVS with the Office number [phone]. Patient verbalizes understanding and has no questions.  Future Appointments   Date Time Provider Department Center   12/13/2017 11:00 AM Camilia Lennon Alstrom [facility] INTE [facility]   12/19/2017 12:00 PM Stefan Church, CNP [facility] NEUR [facility]   12/28/2017 10:00 AM Quentin Mulling, MD [facility] INGR [facility]NI [facility]   01/26/2018  1:15 PM Shafi Lodhi [facility] PSY OP OP   02/16/2018 10:00 AM Orbie Pyo, MD [facility] MAB MAB   03/01/2018 11:00 AM Biagio Borg, Ph.D. [facility] INTE [facility]   07/13/2018  9:20 AM Ilona Dubuske V, DO [facility] ALG HOX HOX

## 2017-12-08 NOTE — Unmapped (Addendum)
HPI: Pt is a 57 y/o female with anxiety, pseudoseizures, h/o CVID, and h/o mast cell activation syndrome here for follow up.  She started hydration therapy with an integrative medicine specialist and is doing great!  No issues with hives/flushing or mast cell issues.  She continues on all her medications prescribed by outside Allergist (Dr. Vickie Epley) and is taking hydroxyzine 25mg  TID and  doxepin 10mg  qhs.  These medications have helped significantly with her anxiety but she is feeling daytime fatigue.    ??  No episodes of sinusitis or bronchitis.  She reports some folliculitis issues in her groin and neckline.  No hospitalizations related to infection in past year.  ??  Physical Exam:  Well-appearing female in no acute distress  Lungs clear without wheezing  Skin: no rashes, no hives  Neuro: no focal deficts    A & P:  1. H/o Mast cell activation syndrome - patient still has not previous brought records in for our review; although report from prior visits states that she has not had e/o elevated mast cell mediators in past:  - cont meds as outlined in Dr. Rolm Gala note  - she is doing well and we discussed that we did not want to taper meds currently given how asymptomatic she has been  ??  2. H/o CVID on prior IVIG, doing well without infections currently:  - currently labs are not concerning for CVID - had an isolated mildly decreased IgG with normal IgA and IgM, tetanus titers good, pneumococcal titers low  - she reports h/o rxn to pneumovax so WILL NOT check post-vaccine titers unless she develops signs/symptoms of immunodeficiency (ie Infections

## 2017-12-13 ENCOUNTER — Ambulatory Visit: Payer: PRIVATE HEALTH INSURANCE

## 2017-12-14 ENCOUNTER — Ambulatory Visit: Payer: PRIVATE HEALTH INSURANCE | Attending: Neurology

## 2017-12-19 ENCOUNTER — Ambulatory Visit: Admit: 2017-12-19 | Discharge: 2018-02-03 | Payer: PRIVATE HEALTH INSURANCE | Attending: Family

## 2017-12-19 DIAGNOSIS — G43719 Chronic migraine without aura, intractable, without status migrainosus: Secondary | ICD-10-CM

## 2017-12-19 MED ORDER — sodium chloride 0.9%
INTRAMUSCULAR | Status: AC
Start: 2017-12-19 — End: 2017-12-19

## 2017-12-19 MED ORDER — botulinum toxin A (BOTOX) injection 200 Units
100 | Freq: Once | INTRAMUSCULAR | Status: AC
Start: 2017-12-19 — End: 2017-12-19
  Administered 2017-12-19: 20:00:00 200 [IU] via INTRAMUSCULAR

## 2017-12-19 MED ORDER — botulinum toxin A (BOTOX) 100 unit injection
100 | INTRAMUSCULAR | Status: AC
Start: 2017-12-19 — End: 2017-12-19

## 2017-12-19 MED FILL — SODIUM CHLORIDE 0.9 % INJECTION SOLUTION: INTRAMUSCULAR | Qty: 10

## 2017-12-19 MED FILL — BOTOX 100 UNIT INJECTION: 100 100 unit | INTRAMUSCULAR | Qty: 2

## 2017-12-19 NOTE — Unmapped (Signed)
MRN:  [id number]  Date: 12/19/2017   Emily Bonilla is a 57 y.o. female who comes in for scheduled Botox injection.      BOTOX PREEMPT PROTOCOL     Total headache days per month: 30.  Total days headache free per month: 0  Severity of headaches: moderate-severe  Botox effective: Yes - but she has been having much more back issues which are triggering headaches.   Pain medicine referral - they may be able to do epidural injections.     The risks, benefits and anticipated outcomes of the procedure, the risks and benefits of the alternatives to the procedure, and the roles and tasks of the personnel to be involved, were discussed with the patient.     The side effects of onabotulinum toxin used for the treatment of chronic migraine have been described to the patient in detail.  These include: ptosis, neck pain, neck weakness, anaphylaxis, cellulitis, systemic spread (death, respiratory failure, dysarthria, dysphagia, diplopia), blood born illnesses (HIV, hepatitis, mad cow disease).   The patient is aware that any side effects related to muscle paralysis could last for 3 months.     The patient has given written informed consent to the procedure and agrees to proceed.Yes     Treatment # 3   (DO NOT USE ALCOHOL - SHE IS ALLERGIC)  Dilution: 5 units/0.1 ml (200 U of botox was mixed with 4 ccs of bacteriostatic saline.)  Indication: Chronic Migraine        Injection Sites:  0.1 ccs of this solution was injected into the following:  Muscle    Fixed Site/Fixed Dose  L      R   Corrugator??????????????????????????????????????10 Units divided in 2 sites??????????????????????????????????????????  ??  Procerus ??????????????????????????????????????????5 Units in 1 site ????????????????????????????????????????????????????????????????????????  ??  Frontalis????????????????????????????????????????????20 Units divided in 4 sites????????????????????????????????????????????  ??  Temporalis ??????????????????????????????????40 Units divided in 8 sites ????????????????????????????????????????  ??  Occipitalis ??????????????????????????????????????30 Units divided in 6 sites ????????????????????????????????????????  ??  Cervical PSPs??????????????????????????20 Units  divided in 4 sites??????????????????????????????????????????  ??  Trapezius????????????????????????????????????????30 Units divided in 6 sites???????????????????????????????????? 22.5 units extra left trapezius in 5 divided doses  ????????????????????????????????????????????????????????????????????????????????????????????????????????????????????????????????????????????????????????????????????????????????????????????     ??22.5 units extra right trapezius in 5 divided doses  ????????????????????????????????????????????????????????????????????????????????????????????????????????????????????????????????????????????????????????????????????????????????????????????   ????Did not do this (Low injections down mid spine where muscle is tight.)    Total Units used:200   Total Units wasted:0   Patient did tolerate procedure.     Change in Treatment Plan?no    Will refer to pain management to assess possibility of epidural injections in her spine for her neck and back pain.    RTC: 3 Months, For Botox and PRN    Stefan Church, CNP

## 2017-12-20 ENCOUNTER — Ambulatory Visit: Admit: 2017-12-20 | Discharge: 2017-12-20 | Payer: PRIVATE HEALTH INSURANCE

## 2017-12-20 ENCOUNTER — Ambulatory Visit: Payer: PRIVATE HEALTH INSURANCE

## 2017-12-20 DIAGNOSIS — M5442 Lumbago with sciatica, left side: Secondary | ICD-10-CM

## 2017-12-20 NOTE — Unmapped (Signed)
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        NEW OBSERVATION: Receives botox injections for migraines.     With Mast Cell disorder, is in constant awareness of anaphylaxsis.  Ex: allergy to perfumes and fragrances  Neck pain causes left arm pain and weakness.      POINTS TREATED:    HEAD: NA      TORSO: Du 14, UB 43, 18, 23.  GB 30   Ah Shi: sacrum      UPPER LIMBS: SI 3      LOWER LIMBS: UB 62, 60.  Sp 9      EAR: NA      Needles were adjusted after 15 min  Needles were removed after 30 min      ACCESSORIES: NA      INSTRUCTIONS: NA      NOTES:

## 2017-12-21 NOTE — Telephone Encounter (Signed)
Emily Bonilla, [facility], called to states that pt had a break out from port. Requesting that MD provide IV push benadryl PRN for pt to have in the home. States that had vomiting and diarrhea but she believe it is related to something pt ate. States that pt uses [employer] in chart. Maralyn Sago can be reached at [phone]

## 2017-12-22 MED ORDER — busPIRone (BUSPAR) 10 MG tablet
10 | ORAL_TABLET | ORAL | 0 refills | Status: AC
Start: 2017-12-22 — End: 2018-01-26

## 2017-12-22 MED ORDER — hydrOXYzine HCl (ATARAX) 25 MG tablet
25 | ORAL_TABLET | ORAL | 0 refills | Status: AC
Start: 2017-12-22 — End: 2018-01-12

## 2017-12-27 MED ORDER — triamcinolone (KENALOG) 0.1 % ointment
0.1 | TOPICAL | 11 refills | 1.00000 days | Status: AC
Start: 2017-12-27 — End: ?

## 2017-12-28 ENCOUNTER — Ambulatory Visit: Admit: 2017-12-28 | Payer: PRIVATE HEALTH INSURANCE

## 2017-12-28 DIAGNOSIS — D8940 Mast cell activation, unspecified: Secondary | ICD-10-CM

## 2017-12-28 NOTE — Unmapped (Signed)
[facility]        FOLLOW UP PATIENT VISIT NOTATIONS                  For Samreen Sevcik                               Clinician: Phyllis Ginger, MD    Chart notes (UC, and CareEverywhere), Problem List, Labs, Allergies and Medications are reviewed in preparation for current visit.    PCP: Court Joy, MD  Referred by: DR Arnetha Massy, PhD    -----------  HPI:  Danyiel Crespin is a 57 y.o. who is here for her follow up consultation with [facility].    Patient's initial reasons for seeking consultation:      1. Mast cell activation syndrome (CMS Dx)     2. CVID (common variable immunodeficiency) (CMS Dx)     3. Multiple allergies     4. Anaphylaxis, subsequent encounter     5. Stress     6. Glucose intolerance (impaired glucose tolerance)     7. Latex allergy       No problems updated.  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 Dr Rogelio Seen( Allergist), Dr F Awan(hematology), Dr Leonard Downing( neurologist). ??Although, I would go to [facility] as needed for tweeks in my care.   IV fluids were very important to keep my body stable. ??It's amazing how good I feel after getting fluids. ??When my body stresses my Mast cells flair, I flush more and have increased diarrhea which is not helpful for fluid retention, increased inflammation, brain fog, extreme fatigue, BP drops with passing out( syncope) and until the last 2 years my lungs were very bad. ( I'm appreciative that the last two years my lungs have been great!!), itching, VCD, MTD issues, and two more Life flight transports needed in 2009, 2011 both while on vacation out of state. I had severe reactions that could not be controlled by local ER so I was air vac to hospitals that knew how to treat Mast Cell  Activation. ?? In 2007 Dr Carmon Ginsberg Awan(he is at [facility] now)Hematology was brought onto my care. ??   He evaluated me from top to bottom. ??Found that I would do better on IVIG, IV fluids, IV iron and magnesium. ?? B 12 levels fluctuated but never as low as they are now. ??I Always had issues with folic acid levels. But are ok now.   Back to Dr Nelle Don after my veins developed so much scar tissue and Emergency Response teams could not get IV in during emergencies and the IVIG replacement needed my first port was put in 2008. ??I had that port until 2013. A new port was put in on opposite side in 2014 due to sluggish Infusions. [facility] [place] [facility] put me on Daily IV fluids1000 cc of LR, IV push benadryl q4-6 hours via my port. ??Steroids as needed. ??I was in a awful flair for several years. ??As hard as it was to keep up with the regiment ??I felt so much better and was able to enjoy life more, see friend and travel.. The summer of 2015 was when  I kept having increased seizures and syncope in grocery stores produce dept( [employer] and bi lo) the seizures wer increasing big time. ??So the Allergist put me back on doxipen and steroids with my IV benadryl and IV fluids I got better. June 2017 broke legs after syncope from smelling perfum. Allergic reactions to O2 mask and antibiotics and   pain meds triggered symptoms and my face, lower lip, eyes swelled and then became infected. ??I was hospitalized 3 months for face surgery, left tibia fib multipal break and mast cell reaction. CVID was kicking in as my IgG levels were 319. My port was taken out and a pic line put in just incase my port compromised. ?? My port came back clean from infection. ??After I was discharged from hospital to rehab nursing home to slowly ambulate as I had not been at bearing for 4 months. ??I stayed 3 weeks as they had cockroaches and could not take care of my dietary needs. ??I left and rehabbed myself. ??Since then I have had flairs  related to allergy to hardware to left tibia fib and the transition off my IV fluids and trying to find a hospital that could treat me/not afraid of my conditions. ??That took 4 years!!!!!!!! ??     During all the medical issues I was going through a divorce Oct 2012.   That was rough transission as my drs of 14 years retired and/or left [employer] [place] after affordable health care act started to be implimented.   Lots of changes and I went to get psychotherapy weekly to bi weekly for the next 4 years. ??This was very important to my over all health. My ex and I went to marriage counciling 4 years weekly to deal with the stressors of my illness on our marriage before the divorce.     =====================  PREVISIT PREPARATION:   SINCE YOUR LAST VISIT, what CHANGES (pro or con) have occurred or have you made in regards to    Integrative Medicine Follow Up Visit  12/26/2017   How are you feeling overall since your last visit? Much better, less irritation, more energy, No infections, cataract both eyes removed, cervical spine inflmation: started back with acupuncture,   Advice/Orders from your Primary Care doctors or Specialists? Metformin 500mg  bid, levothyroxine 137,  IV Lactated ringers 2 days on 5 days off, acupuncture,  night sweats started in November i do not have sweats while awake,   Other therapists like Physical Therapy, Massage, Acupuncture, Chiropractic, Psychologists/Counselors, Group Visit/Classes? Acupuncture   How you occupy your time? (Work, Leisure, Engineer, structural, Catering manager.): TV, Tree surgeon, internet, i need a hobby.   Sleep - (time to bed, getting up, quality of sleep): 10-11 pm, 6-7 am.  Started to have hot sweats mid November during sleep which wakes me up.   Movement - (exercise or steps, etc.): Streaching, heating pad to neck, lower back.  Lots of inflimation causing left arm to go numb, pain un neck is very painful at times.  Not 24/7.   Calming Habits or Practices - (breathing, relaxation, apps,  etc.): Insight timer meditation, low volume TV, no aggressive or violent TV programs, strict bedtime,  hot herbal tea   Spending Time in [place] - (woods, sunshine, etc.): Outside during nice days,   What you Eat - (what kind of diet or specific food changes, etc.): Same avoidance diet, was craving bananas thought i needed potassium, I has moderate anaphylaxis to banana about 5 min after eating.  Nervouse  system hypersensitive.   Nurse accessed my port 1/2  after banana and increased anaphylaxis needing IV benadryl   How you Eat - (meal timing, chewing, etc.): Am toast with coffee, noon sandwitch and fruit, dinner sandwich and fruit.  I try to eat same meal every meal   Relating to Others - (friends, family, organizations): Good family and friends communication,  home heath care company is POOR.... they do not send qualified employees   Environmental Exposures - (air, water, cleaners, cosmetic products you use, etc.): I am very good with keeping mostly natural.   Dental/Oral Health - (What does your dentist say?): No cavities, no pain   Please carefully report any medication (Rx) changes. Assume that we cannot see your current medications in the computer record). Levothyroxine 137, metformin 500 bid, antiobiotic gtts and steroid gtts after cataract surgery,  IV lacted ringers 2 dsys on 5 days off   List all of the supplements that you are currently taking. Include the name, dose, how many a day, and what brand.  D3K2 (5,000)every other day, B12 every other day   What are your top three health goals/changes you still want to achieve and how can we help you achieve these? To be more active( i am much more active than last summer), pain free, I wonder if my diabetes is actually a mast cell issue.  My BS went up( 200-280) after a reaction to a banana.  After IV push benadryl my BS stayed down  actually had a BS of 97   What do we need to accomplish during this visit. Choose the three most important. If you choose more  than three, we will most likely need several visits. Update your story, review outside testing, etc. since the last time we met, update your UC medical record, Reorder perscriptions       -------------------------------------  ROS/ SYMPTOM TRACKER (IFM MATRIX REVISED)   X= Moderate risk domains; XX= Higher risk domains  O= Lower risk domains  CC (pt's own goals):  Identifying possible triggers of inflammation   Inflammation,   Pain   Anaphylaxis       Patient care team:  Patient Care Team:  Court Joy, MD as PCP - General (Internal Medicine)  Christena Flake, DO as Consulting Physician (Allergy and Immunology)     12/28/2017   08/01/2017   06/27/2017    First visit: 06/09/2017       LABS    X  Several abnl metabolic markers     [facility] STAFF CONTACT,      There is no immunization history on file for this patient.    Lab Results   Component Value Date    HMDIABEYEEX Normal DM eye exam/Dr Klemencic/[facility] 07/28/2017            >>CORTISOL TESTING SALIVA PER ENDO  >>WISH LIST, CBC/TPO AB ALONG WITH PCP F/U      REVIEWED RECENT UC LABS    >>GIMAP IF [facility] WILL COVER  >>B12 INJ  >>HEM ONC EVAL FOR ASSIST WITH MGMT/ PORT CareEverywhere reviewed:   EXAM      Several exam findings          Vitals:    12/28/17 1006   BP: (!) 134/98   Pulse: 66   SpO2: 93%     Wt Readings from Last 3 Encounters:   12/28/17 215 lb 3.2 oz (97.6 kg)   12/19/17 214 lb 9.6 oz (97.3 kg)   12/08/17 215 lb (97.5 kg)  HERE ALONE  ATTENDANT IN LOBBY      MSQ  (Medical Symptom Questionnaire)  Subjective assessment of symptoms, ideally reduced during course of therapy    104  124  129 152  MSQ > 30         From Functional Med Matrix         SOCIAL /MENTAL:   X  Several historic or current challenges to emotional wellbeing, and/or psychological balance    Including hx ACE, emotional traumas, safety issues, anxiety, depression, etc    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, [employer], [place]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  DIV 2012 AFTER 4 YRS WEEKLY COUNSEL    RAISED BY  MOM/ STEP DAD IN  [place]    HX OF WORK AS ICU NURSE    HAS MOVED FOR HEALTH CARE MANY TIMES SINCE 2012     GETTING PORT/ IVF TWICE A WEEK,   CAN GET FLUIDS WHILE AWAY A FEW WEEKS    B CATARACT SURG    (10% VISION) > LESS BLURRY/ DOUBLE W/ UV PROTECTION      DOING INSIGHT TIMER, BREATHING,   SEEING COUNSELOR    NIGHTLY QUIET TIME     COMPLEX HEALTH STORY  ANXIETY  MAST CELL ISSUES    COGNITIVE IMPAIRMENTS WITH ANAPHYLAXIS    G1P1 (DAU '85)  GDAU '17 SICK BABY    WT UP 70# IN PAST 8 MONTHS (CAN ONLY ACCOUNT FOR 50# FROM AUG'18 IN RECORD)     HAS EMERGENCY GMS MONITOR, PERSONAL ON [employer]  ALSO HAS AIDE TO ALL APPTS    HAS MOVED FOR HEALTH CARE MANY TIMES OVER PAST 7 YRS     ASSIMILATION/ Digestion:   X  GI / Digestion & Absorption issues       Issues considered include history/current use of NSAID, PPI, H2Bs  increasing intestinal permeability;  Surgical interventions such as Cholecystectomy, Appendectomy, partial or more extensive regional intestinal resections,compromising digestive resilience,  Diagnoses of GERD/HH/ IBS vs IBS    HX CHOL'Y '15  ?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  IgG food testing:  No results found for: AIGGCASEIN, AIGGWHEAT, AIGGOAT, AIGGMAIZCORN, AIGGPEANUT, AIGGSOYBEAN, AIGGEGGWHOLE    FOOD SENS TESTING IN '98 (VANDERBILT)  FINDING PRESERVATIVE/REACTIVITIES    GIMAP/PCR stool testing:  June '19, PE 500, sIgA 208, +STAPH,     BANANA > LATEX SYNDROME  (NEEDED POTASSIUM)  >BLOAT    EVENING FRUIT     BLOATING UP AFTER 10 MIN IN NEW BUILDING   ONGOING CHR DIARRHEA    >>GIMAP   BM 15X THIS AM,  2 KINDS OF DIARRHEA SINCE CHOL'Y    Ulcers;Colitis;Gallbladder Disease;Diarrhea;Gastroenteritis (nausea and vomiting)    HX CHOL'Y '15  ?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 AVOID CHICKEN    OFF RICE, SOY  LIMITS GLUTEN,   MAKES OWN BREAD  EGGS SEEM OK       DEFENSE/ REPAIR/ Immune-Inflamm:  XX  Risks for infections or inflam/   autoimmune issues    Issues considered include chr/recurrent  NSAID use, recurrent illnesses and/or repetitive antibiotic use, needs for chronic or intermittent topical/oral/ inhaled/ injectable streoids; personal and family history of Inflammatory and/or Autoimmune disorders,      NEEDED IVP BENEDRYL    RECENT BANANA REACTION > ELEVATED GLUCOSE READINGS,      Lab Results   Component Value Date    WBC 12.6 (H) 05/31/2017          OUTBREAK OF POISON OAK FROM SOME YARD WORK    MAST CELL ISSUES   > DR LATIF 8/8  FOR MGMT, THEN PORT TO BE DISCUSSED    18 MONTHS W/O IVIG,  AFTER 9 YEARS IVIG    CATARACT, R EYE POOR VISION > SURG IN [place]    Lab Results   Component Value Date    WBC 12.6 (H) 05/31/2017   WBC:  %N: 59.7  %L: 27.7  %M: 11.1  %E: 1.1  %B: 0.4    STILL HIGH SINUS CONG, DAILY SINUS LAVAGE    SOME REACTIVITY TO OREGANO SO LIKELY UNSAFE FOR OREGANO MISTS   NEEDS FLUID FOR MASTOCYTOSIS  >>DR SYED AHMAD FOR ?PORT    HIGH CRP  Lab Results   Component Value Date    CRP 17.8 (H) 06/09/2017      HX:  5+ ABX PAST 5 YRS   10+ IN LIFE   Pneumonia, sinus infection, skin infections  BITTEN BADLY BY RED ANTS AGE 31,    5+ STEROID COURSES   Mast Cell Activation Syndrome,     MAST CELL DEGRANULATION ISSUES  (WALTER REED) DX '96 (DR HARBUT)  FAM HX MAST CELL ISSUES (MAT AUNT D.58, MAT UNCLE, MAT GF D. 49)  VOCAL CORD DYSKINESIA,  S/P HELIOX TX,     ANAPHYLAXIS > LIP ANGIOEDEMA > SURGERY TO REPAIR    MONTHLY IVIG, SINCE '08 -'14  BEST IgG 700s  '14-'17 PATCHWORK DOCS WITH HYZINTRA  '18-PRESENT HAS A TEAM AT UC  OFF IVIG PAST 2 YRS AND AND WT HAS GONE UP FROM 89# 2 YRS AGO    BOTOX Q3 MO FOR PAIN MGMT      Lab Results   Component Value Date     WBC 12.6 (H) 05/31/2017   WBC:  %N: 59.7  %L: 27.7  %M: 11.1  %E: 1.1  %B: 0.4    No results found for: HSCRP,   Lab Results   Component Value Date/Time    CRP 17.8 (H) 06/09/2017 1549     OPTIMAL: CRP<1  Lab Results   Component Value Date/Time    ESR 26 06/09/2017 1549    ESR 29 05/31/2017 1118     No results found for: SEDRATE  No results found for: ANA  No results found for: RF     ENERGY/ mitochondria:   X  Cellular Energy compromises    Issues considered include conditions potentially causative of mitochondrial dysfunction,  compromise of Krebs/ electron transport, ATP production and oxidative stress  Factors including chronic /recurrent cephalgia, central and/or peripheral neurologic deficits, balance issues; nutrient deficiences such as cofactors for ETC: B2, B6, B12, folate, CoQ10, iron or diets deficient in antioxidants       METHYL B12 every other day  INJECTION    Lab Results   Component Value Date    VITAMINB12 163 (L) 06/09/2017     Lab Results   Component Value Date    IRON 73 06/09/2017    TIBC 375 06/09/2017    FERRITIN 46.0 06/09/2017  DID HAVE BOTOX    CAN GET 30 TX ACUPUNCTURE / NECK PAIN     STARTED VITAMIN D2 RX AFTER POISON OAK > WORSE HIVING  BUT > D3  SOME ITCHING,     SAW ENT / DR Vonita Moss > THYROID INFLAMED,  DID NOT SEE ANY HYPERPARATHYROID TUMORS > CONT VIT D/ CALCIUM MGMT AND FOLLOW PTHs    USING ONE HALF ML mB12 =1000MCG TIW INJ,   > MORE ENERGY, LESS FRUSTRATION    Lab Results   Component Value Date    WBC 12.6 (H) 05/31/2017    HGB 14.8 05/31/2017    HCT 45.2 (H) 05/31/2017    MCV 84.9 05/31/2017    PLT 317 05/31/2017        B12 VERY DEFICIENT BUT CONCERN FOR ANAPHYLAXIS AND RECOMMEND A NON-PRESERVATIVE PRODCT  >>METHYL B12 COMPOUNDED FROM [facility]  >>MONITOR W/ NURSE FOR FIRST INJECTION    HAS BENEFITTED IN PAST REPORTEDLY FROM IRON INFUSIONS, ONLY CLUE WOULD BE LOW END MCV.Marland Kitchen  GIVEN HIGHER POTENTIAL FOR ANAPHYLAXIS, DEFER IRON OPTIONS TO HEMONC      SEIZURES   LOC    SZ IN GROCERY  STORE/S > HOME DELIVERY    Lab Results   Component Value Date    HGB 14.8 05/31/2017    HCT 45.2 (H) 05/31/2017    MCV 84.9 05/31/2017    PLT 317 05/31/2017       Lab Results   Component Value Date    VITAMINB12 163 (L) 06/09/2017     Lab Results   Component Value Date    FERRITIN 46.0 06/09/2017     Homocysteine   Lab Results   Component Value Date    HOMOCYSTEINE 8.8 06/09/2017   ,(Hcy OPTIMAL <7):     MMA :  (OPTIMAL <150)  CoQ10 :  (OPTIMAL >0.86)     COMMUNICATION/ hormones:  XX  Hormonal concerns  Evidence of cellular communication compromise,    Issues considered including HPA Axis imbalance, autonomic dysfunction, glycemic dysregulation, centripetal adiposity, thyroid and/or gonadal dysfunction     WAKING UP FROM INTENSE HEAT 4-5 TIMES AT NIGHT  NO DAYTIME HOT FLASHES  (SLEEPS ON MEMORY GEL)    MORE RECENT HAIR LOSS, IS GROWING BACK     POCT  A1C 6.7 ON METF BID      Lab Results   Component Value Date    TSH 3.82 11/10/2017    T3TOTAL 109.3 11/10/2017    FREET4 1.18 11/10/2017    THYROIDAB 747.0 (H) 06/09/2017     Lab Results   Component Value Date    PTH 79.0 11/10/2017    CALCIUM 9.5 11/10/2017    PHOS 4.1 11/10/2017     Lab Results   Component Value Date    VITD25H 37.7 11/10/2017          SEEN BY ENDO > INCREASED THYROID DOSE TO 125, PAST MONTH    ENT >THYROID US > RAGING WITH AUTOIMMUNITY      ADDED METFORMIN BID  > WT LOSS  Lab Results   Component Value Date    HGBA1C 7.3 (H) 06/09/2017         BROTHER BRITTLE DIABETIC (DM2)  MOTHER THYROID CANCER, SEV OTHER RELATIVES,    HX DUB, > HYST '09  HX SHOCK/ COLLAPSE DAY 1-2 OF PERIODS    THYROID INFLAMES  ON RX T4 110  Lab Results   Component Value Date    TSH 6.11 (H) 06/09/2017    T3TOTAL  98.0 06/09/2017    FREET4 0.81 06/09/2017    THYROIDAB 747.0 (H) 06/09/2017     Lab Results   Component Value Date/Time    T3REV 14.8 06/09/2017 1549     TT3/ RT3: __  (TT3/ RT3 OPTIMAL 6-10)  Lab Results   Component Value Date    HGBA1C 7.3 (H) 06/09/2017   (OPTIMAL  <5.3)     ELIMINATION/ detox:   XX  Phase I/ II Detox pathway, GI or renal elimination risks    Issues considered include poor phytonutrient provision and hydration, gastrointestinal dysfunction, slow transit compromising first line detox as well as hepatic overload from environmental exposures, tobacco 1st-3rd hand, EtOH, hobbies, dental amalgams, and genomic uniqueness such as SNPs for COMT/SOD, etc         .  Lab Results   Component Value Date    ALT 14 10/28/2016    AST 16 10/28/2016    GGT 53 06/09/2017    ALKPHOS 110 10/28/2016    BILITOT 0.3 10/28/2016           .  MULTI CHEM SENSITIVITIES    Lab Results   Component Value Date    GGT 53 06/09/2017   GGT (OPTIMAL < 30)    >>L-GLUTATHIONE/ NAC OPTIONS      MULTIPLE CHEM SENSITIVITY  USES ION PERSONAL CARE PRODUCTS ,    Lab Results   Component Value Date    ALT 14 10/28/2016    AST 16 10/28/2016    GGT 53 06/09/2017    ALKPHOS 110 10/28/2016    BILITOT 0.3 10/28/2016     GGT (OPTIMAL < 30)  URIC ACID No results found for: URICACID  Optimal < 6   STRUCTURAL INTEGRITY:  X  Structural issues     Issues considered include birth traumas, spinal segment compromises, MVA, fractures, repetitive motion injuries and persistent ergonomic stressors      DENTAL SURGERIES    R TIB FRX > TITANIUM, REACTIVE. AND TOOK OUT TITANIUM 10 MO AGO     Lab Results   Component Value Date    VITD25H 37.7 11/10/2017   GETTING NECK PAIN> L ARM NUMBNESS  ACUPUNCTURE, BUT HAD BOTOX DAY PRIOR   Lab Results   Component Value Date    VITD25H 10.5 (L) 06/09/2017   STARTED D  BUT HAD TO DC TEMPORARILY D/T INCREASED HIVES      DENTAL SURGERIES    R TIB FRX > TITANIUM, REACTIVE. AND TOOK OUT TITANIUM 10 MO AGO    HX PORTS  PLACED POST OP LIP ANAPHYLAXIS    Lab Results   Component Value Date    VITD25H 10.5 (L) 06/09/2017     (OPTIMAL 50-60)  PTH:   Lab Results   Component Value Date    PTH 151.0 (H) 06/09/2017    CALCIUM 9.4 05/31/2017   Optimal PTH <30     TRANSPORT / CV:  X  Circulatory concerns     Issues weighed include evidence of vascular compromise, inflammatory contributors to endothelial dysfunction; hypertension, coagulation defects, anticoagulation, medications causing dysregulation of vascular stability     PORT/  IV FLUID:      Vitals:    12/28/17 1006   BP: (!) 134/98   Pulse: 66   SpO2: 93%     Wt Readings from Last 3 Encounters:   12/28/17 215 lb 3.2 oz (97.6 kg)   12/19/17 214 lb 9.6 oz (97.3 kg)   12/08/17 215 lb (97.5 kg)  Vitals:    08/01/17 0822   BP: 122/78   Pulse: 80   SpO2: 97%     Wt Readings from Last 3 Encounters:   08/01/17 215 lb 3.2 oz (97.6 kg)   06/29/17 (!) 222 lb 4.8 oz (100.8 kg)   06/29/17 (P) 222 lb (100.7 kg)         Vitals:    06/27/17 1522   BP: 138/82   Pulse: 84   SpO2: 96%       Wt Readings from Last 3 Encounters:   06/29/17 (!) 222 lb 4.8 oz (100.8 kg)   06/29/17 (P) 222 lb (100.7 kg)   06/27/17 221 lb 3.2 oz (100.3 kg)        HX:   WPW  WT GAIN    RX:   Vitals:    06/09/17 1348   BP: 132/68   Pulse: 87   SpO2: 97%     Wt Readings from Last 3 Encounters:   06/09/17 218 lb 9.6 oz (99.2 kg)   06/07/17 217 lb (98.4 kg)   06/02/17 217 lb 3.2 oz (98.5 kg)   WT 165  8/'18     8  of 8 Risk Factor Domains                              THERAPEUTIC LIFESTYLE CHANGE (TLC) TRACKER       08/01/2017    06/27/2017    Initial visit: 06/09/2017        CHANGE READINESS:  Precontemplation, contemplation, preparation, action, and maintenance  ACTION PREP PREP   MOVEMENT  Assessing for compromises including sedentary lifestyle or work, impaired mobility, or energy     MOVEMENT:      1         2       3         4        5        X     low risk     >>>>>>  higher risk       SLEEP  Assessing compromises including difficulty with sleep onset, maintenance or quality, or quantity <7 hours routinely, shift work, dark/light reversal    EPWORTH:  No flowsheet data found.      SLEEP:     1         2       3         4        5       X     low risk     >>>>>>  higher risk    NEWER PUFFING WITH SLEEP  SINCE WT GAIN  SUMMER '18     RELATIONSHIPS & SUPPORT NETWORK:  Assessing support for emotional health as well as initiating and maintaining TLCs      MARRIED TO [place] VET > DIV '12      DAU?  GDAU?  RELATING:      1         2       3         4        5       X     low risk     >>>>>>  higher risk    CHURCH  DAU  BABY GRANDDAU     EMOTION INVENTORY:  Assessing concerns for historical or ongoing  physical or emotional safety, poor self efficacy or esteem, labile mood or frequent anxiety, dysthymia      EMOTIONS:        1         2       3         4        5       X     low risk     >>>>>>   higher risk   TOX/DETOX  Assessing concerns for former or current tobacco or excessive  EtOH, or substance exposures; occupational or hobby related xenobiotic or VOC*/ heavy metal exposures:  Chemotherapeutic agents; Rx polypharmacy      *VOC: volatile organic compounds/ or Hazardous materials  Resources cited: [email or web address]    DETOX:     1         2       3         4        5            X   low risk     >>>>>> higher risk   RESTORATIVE PRACTICES:  Assessing concerns for sufficiency of self care practices, creation of quiet, implementation of mindfulness, restorative breathing practices, touch, disconnection from [place], etc        MBSR      1         2       3         4        5        X     low risk     >>>>>    higher risk    STARTED WITH MEDITATION IN '98,   AWAKENS QUIET,  AM MEDITATION NOW  HAS APPS ON PHONE, INSIGHT TIMER  Tend flowers, meditation, watch [employer] station, play memory games   [place], grandchildren, flowers, nature, travel, family.   >>MBSR TOOLS, Rx   DENTAL  Assessing  concern for hygienic, surgical, or ongoing ORAL Inflammatory/ Infectious processes        DENTAL:      1         2       3         4        5           X    low risk     >>>>>    higher risk    Grinding;TMJ;Gum Disease;Root Canal/s   All done from 2004-2012.  April 2019 oral surgery to remove a decayed tool part of a 3pc bridge. Went to OR and intubated  and over night in hospital.      HYDRATION:   Assessing sufficiency of water intake, through free water and/or food fluid    >>Advised on hydrating to point of light yellow urine    H2O:     1         2       3         4        5       X     low risk     >>>>>    higher risk  Currently at  - - oz water daily    10 X 16 OZ WATERS DAILY,  SO DRY,  MISSING IVF  PER PORT    DESIRES:  IVF  FLUIDS LR 500 CC/NIGHT >  X 30D THEN 1000 CC Q 3D     DIET/ NUTRIENTS:  Assessing phytonutrient intake levels, individual's of awareness and implementation of food as medicine, their use of whole foods, phytonutrient diversity, avoidance of high refined sugar/ processed foods      HIGHLY CAUTIOUS OF MOLDS IN DIET,  ORAL LESIONS FROM CHEESE?    LIMITS GLUTEN  LIMITS RICE    USES CANOLA,  INTOL OF OLIVE    DOES EAT SOME HAMBURGER    DIET:       1         2       3         4        5        X     low risk     >>>>>    higher risk    OFF RICE, SOY  LIMITS GLUTEN,   MAKES OWN BREAD  EGGS SEEM OK   SUPPLEMENTS:                                         HIGHLY PRESERVATIVE SENSITIVE!!      No results found for: LIPIDCOMM, CHOLTOT, TRIG, HDL, CHOLHDL, LDL, LDLCALC, LDLDIRECT     RX  DOXEPIN 10MG     ZYRTEC 10MG   PEPCID 4/DAY   BENEDRYL ORALLY 6/DAY STILL (DOWN FROM 12)  OCCAS IV BENEDRYL    SINGULAIRE  XOPENEX  ALLEGRA  BUSPAR  METF 2/D    T4 137    MB12 Q ODAY   RX  DOXEPIN 10MG  26MG  (  ZYRTEC UP TO 6/D  PPI OR PEPCID  BENEDRYL 150 MG/D  SINGULAIRE  XOPENEX  ALLEGRA  BUSPAR    T4 125 past few weeks  mB12 1K TIW >> EVERY OTHER DAY    Recent addition of...  METFORMIN  (flushed x 2 wks, not much D)    >>SBI PROTECT (immunoglobulin) 1>4/ day     RX   T4 112  DOXEPIN 10MG  26MG  (  ZYRTEC UP TO 6/D  PPI OR PEPCID    BENEDRYL 150 MG/D  SINGULAIRE  XOPENEX  ALLEGRA  BUSPAR    >>mB12 TRISTATE  >>IVF/ HEM ONC REF  >>L-GLUTATHIONE VS NAC RX   T4 112  DOXEPIN 10MG  26MG  (  ZYRTEC UP TO 6/D  PPI OR PEPCID  BENEDRYL 150  MG/D  SINGULAIRE  XOPENEX  ALLEGRA  BUSPAR    NO IVF   USED TO GET 500>1200 ML    SUPP:  OFF  TRIED MANY PROBIOTICS         KEY:  >>: POC;  Sx/RISKS 1  LOW < <--5  HIGH   C: CONTINUE  S: STABLE  R!: RESOLVED!  P: PERSISTENT  E: EPISODIC  W: WORSE  B: BETTER/IMPROVED  MBSR: MINDFULNESS BASED STRESS REDUCTION   [id number] ENZYMES  VOC: VOLATILE ORGANIC POLLUTANTS  ---------------------  INTEGRATIVE MED FLOW SHEET  No flowsheet data found.    --------------------------------------------------------------------------  MSQ (MEDICAL SYMPTOM QUESTIONNAIRE) key:   0: Never or almost never have the symptom   1: Occasionally have the symptom, effect is not severe   2: Occasionally have the symptom, effect IS severe  3: Frequently have the symptom, effect is not severe   4: Frequently have the symptom, the effect IS severe   MSQ Medical Symptoms Questionnaire Flowsheet 06/09/2017 06/27/2017 08/01/2017 12/26/2017  Headaches Frequently have the symptom, effect is severe Occasionally have the symptom, effect is severe Occasionally have the symptom, effect is not severe Frequently have the symptom, effect is not severe   Faintness Frequently have the symptom, effect is severe Occasionally have the symptom, effect is severe Occasionally have the symptom, effect is not severe Frequently have the symptom, effect is not severe   Dizziness Frequently have the symptom, effect is not severe Frequently have the symptom, effect is not severe Frequently have the symptom, effect is not severe Occasionally have the symptom, effect is not severe   Insomnia Occasionally have the symptom, effect is not severe Occasionally have the symptom, effect is not severe Occasionally have the symptom, effect is not severe Occasionally have the symptom, effect is not severe   Head Total 12 8 6 8    Watery or itchy eyes Occasionally have the symptom, effect is severe Occasionally have the symptom, effect is not severe Occasionally have the symptom, effect is  severe Occasionally have the symptom, effect is not severe   Swollen, reddened or sticky eyelids Occasionally have the symptom, effect is severe Occasionally have the symptom, effect is not severe Occasionally have the symptom, effect is not severe Occasionally have the symptom, effect is not severe   Bags or dark circles under eyes Frequently have the symptom, effect is not severe Frequently have the symptom, effect is not severe Frequently have the symptom, effect is not severe Frequently have the symptom, effect is not severe   Blurred or tunnel vision Frequently have the symptom, effect is severe Frequently have the symptom, effect is not severe Frequently have the symptom, effect is not severe Frequently have the symptom, effect is not severe   Eyes Total 11 8 9 8    Itchy ears Occasionally have the symptom, effect is severe Occasionally have the symptom, effect is not severe Occasionally have the symptom, effect is not severe Occasionally have the symptom, effect is severe   Earaches, ear infections Occasionally have the symptom, effect is not severe Never or almost never have the symptom Never or almost never have the symptom Occasionally have the symptom, effect is not severe   Drainage from ear Occasionally have the symptom, effect is not severe Never or almost never have the symptom Never or almost never have the symptom Never or almost never have the symptom   Ringing in ears, hearing loss Occasionally have the symptom, effect is severe Frequently have the symptom, effect is not severe Occasionally have the symptom, effect is not severe Frequently have the symptom, effect is not severe   Ears Total 6 4 2 6    Stuffy nose Occasionally have the symptom, effect is severe Occasionally have the symptom, effect is not severe Frequently have the symptom, effect is not severe Occasionally have the symptom, effect is not severe   Sinus problems Occasionally have the symptom, effect is severe Occasionally have the  symptom, effect is not severe Frequently have the symptom, effect is not severe Frequently have the symptom, effect is not severe   Hay fever Occasionally have the symptom, effect is severe Occasionally have the symptom, effect is not severe Occasionally have the symptom, effect is not severe Occasionally have the symptom, effect is not severe   Sneezing attacks Occasionally have the symptom, effect is not severe Occasionally have the symptom, effect is not severe Occasionally have the symptom, effect is not severe Occasionally have the symptom, effect is not severe   Excessive mucus formation Occasionally have  the symptom, effect is severe Occasionally have the symptom, effect is not severe Occasionally have the symptom, effect is severe Frequently have the symptom, effect is not severe   Nose Total 9 5 10 9    Chronic coughing Occasionally have the symptom, effect is severe Occasionally have the symptom, effect is not severe Occasionally have the symptom, effect is not severe Occasionally have the symptom, effect is not severe   Gagging, frequent need to clear throat Frequently have the symptom, effect is not severe Occasionally have the symptom, effect is severe Frequently have the symptom, effect is not severe Frequently have the symptom, effect is not severe   Sore throat, hoarseness, loss of voice Frequently have the symptom, effect is not severe Occasionally have the symptom, effect is severe Occasionally have the symptom, effect is severe Occasionally have the symptom, effect is severe   Swollen or discolored tongue, gums, lips Occasionally have the symptom, effect is severe Occasionally have the symptom, effect is not severe Occasionally have the symptom, effect is not severe Occasionally have the symptom, effect is severe   Canker sores Occasionally have the symptom, effect is not severe Occasionally have the symptom, effect is severe Never or almost never have the symptom Never or almost never have the  symptom   Mouth/Throat Total 11 8 7 8    Acne Occasionally have the symptom, effect is not severe Never or almost never have the symptom Never or almost never have the symptom Occasionally have the symptom, effect is not severe   Hives, rashes, dry skin Frequently have the symptom, effect is not severe Frequently have the symptom, effect is not severe Frequently have the symptom, effect is severe Frequently have the symptom, effect is not severe   Hair loss Occasionally have the symptom, effect is severe Frequently have the symptom, effect is severe Occasionally have the symptom, effect is severe Occasionally have the symptom, effect is not severe   Flushing, hot flashes Frequently have the symptom, effect is severe Frequently have the symptom, effect is not severe Frequently have the symptom, effect is not severe Frequently have the symptom, effect is not severe   Excessive sweating Occasionally have the symptom, effect is severe Occasionally have the symptom, effect is severe Occasionally have the symptom, effect is severe Occasionally have the symptom, effect is severe   Skin Total 12 12 11 10    Irregular or skipped heartbeat Occasionally have the symptom, effect is severe Occasionally have the symptom, effect is not severe Occasionally have the symptom, effect is not severe Never or almost never have the symptom   Rapid or pounding heartbeat Occasionally have the symptom, effect is not severe Occasionally have the symptom, effect is not severe Never or almost never have the symptom Never or almost never have the symptom   Chest pain Occasionally have the symptom, effect is not severe Occasionally have the symptom, effect is severe Occasionally have the symptom, effect is not severe Never or almost never have the symptom   Heart Total 4 4 2  0   Chest Congestion Occasionally have the symptom, effect is severe Never or almost never have the symptom Occasionally have the symptom, effect is not severe Never or almost  never have the symptom   Asthma, bronchitis Occasionally have the symptom, effect is not severe Never or almost never have the symptom Occasionally have the symptom, effect is severe Never or almost never have the symptom   Shortness of breath Occasionally have the symptom, effect is severe Never  or almost never have the symptom Occasionally have the symptom, effect is severe Occasionally have the symptom, effect is not severe   Difficulty breathing Occasionally have the symptom, effect is severe Never or almost never have the symptom Frequently have the symptom, effect is not severe Occasionally have the symptom, effect is not severe   Lungs Total 7 0 8 2   Nausea, vomiting Frequently have the symptom, effect is not severe Occasionally have the symptom, effect is severe Frequently have the symptom, effect is not severe Occasionally have the symptom, effect is not severe   Diarrhea Frequently have the symptom, effect is not severe Occasionally have the symptom, effect is severe Occasionally have the symptom, effect is severe Frequently have the symptom, effect is not severe   Constipation Occasionally have the symptom, effect is not severe Occasionally have the symptom, effect is not severe Occasionally have the symptom, effect is severe Never or almost never have the symptom   Bloated feeling Frequently have the symptom, effect is not severe Frequently have the symptom, effect is severe Frequently have the symptom, effect is severe Frequently have the symptom, effect is severe   Belching, passing gas Frequently have the symptom, effect is not severe Frequently have the symptom, effect is not severe Frequently have the symptom, effect is not severe Frequently have the symptom, effect is not severe   Heartburn Occasionally have the symptom, effect is severe Frequently have the symptom, effect is not severe Frequently have the symptom, effect is not severe Occasionally have the symptom, effect is severe    Intestinal/stomach pain Occasionally have the symptom, effect is severe Frequently have the symptom, effect is not severe Frequently have the symptom, effect is not severe Occasionally have the symptom, effect is not severe   Digestive Tract Total 17 18 20 14    Pain or aches in joints Frequently have the symptom, effect is not severe Frequently have the symptom, effect is not severe Frequently have the symptom, effect is not severe Frequently have the symptom, effect is severe   Arthritis Frequently have the symptom, effect is not severe Occasionally have the symptom, effect is not severe Occasionally have the symptom, effect is severe Frequently have the symptom, effect is severe   Stiffness or limitation of movement Frequently have the symptom, effect is not severe Frequently have the symptom, effect is not severe Occasionally have the symptom, effect is severe Frequently have the symptom, effect is severe   Pain or aches in muscles Frequently have the symptom, effect is not severe Frequently have the symptom, effect is severe Frequently have the symptom, effect is not severe Frequently have the symptom, effect is severe   Feeling of weakness or tiredness Frequently have the symptom, effect is not severe Frequently have the symptom, effect is severe Frequently have the symptom, effect is not severe Occasionally have the symptom, effect is severe   Joint/Muscle Total 15 15 13 18    Binge eating/drinking Occasionally have the symptom, effect is not severe Never or almost never have the symptom Never or almost never have the symptom Never or almost never have the symptom   Craving certain foods Occasionally have the symptom, effect is not severe Occasionally have the symptom, effect is not severe Never or almost never have the symptom Occasionally have the symptom, effect is not severe   Excessive weight Frequently have the symptom, effect is severe Frequently have the symptom, effect is not severe Frequently have  the symptom, effect is not severe Frequently  have the symptom, effect is not severe   Compulsive eating Occasionally have the symptom, effect is not severe Never or almost never have the symptom Never or almost never have the symptom Never or almost never have the symptom   Water retention Occasionally have the symptom, effect is not severe Occasionally have the symptom, effect is severe Occasionally have the symptom, effect is severe Occasionally have the symptom, effect is severe   Underweight Occasionally have the symptom, effect is severe Never or almost never have the symptom Never or almost never have the symptom Never or almost never have the symptom   Weight Total 10 6 5 6    Fatigue, sluggishness Frequently have the symptom, effect is severe Frequently have the symptom, effect is severe Frequently have the symptom, effect is not severe Occasionally have the symptom, effect is severe   Apathy, lethargy Frequently have the symptom, effect is not severe Frequently have the symptom, effect is severe Occasionally have the symptom, effect is severe Occasionally have the symptom, effect is not severe   Hyperactivity Occasionally have the symptom, effect is not severe Never or almost never have the symptom Occasionally have the symptom, effect is not severe Occasionally have the symptom, effect is not severe   Restlessness Occasionally have the symptom, effect is severe Frequently have the symptom, effect is not severe Occasionally have the symptom, effect is not severe Never or almost never have the symptom   Energy/Activity Total 10 11 7 4    Poor memory Frequently have the symptom, effect is not severe Frequently have the symptom, effect is severe Occasionally have the symptom, effect is severe Frequently have the symptom, effect is not severe   Confusion, poor comprehension Frequently have the symptom, effect is not severe Frequently have the symptom, effect is not severe Frequently have the symptom, effect is  not severe Occasionally have the symptom, effect is not severe   Poor concentration Frequently have the symptom, effect is not severe Frequently have the symptom, effect is not severe Frequently have the symptom, effect is not severe Occasionally have the symptom, effect is not severe   Poor physical coordination Occasionally have the symptom, effect is not severe Frequently have the symptom, effect is not severe Occasionally have the symptom, effect is not severe Never or almost never have the symptom   Difficulty in making decisions Occasionally have the symptom, effect is severe Occasionally have the symptom, effect is not severe Occasionally have the symptom, effect is not severe Never or almost never have the symptom   Stuttering or stammering Occasionally have the symptom, effect is not severe Never or almost never have the symptom Never or almost never have the symptom Never or almost never have the symptom   Slurred speech Occasionally have the symptom, effect is severe Never or almost never have the symptom Never or almost never have the symptom Never or almost never have the symptom   Learning disabilities Never or almost never have the symptom Never or almost never have the symptom Never or almost never have the symptom Never or almost never have the symptom   Mind Total 15 14 10 5    Mood swings Occasionally have the symptom, effect is severe Occasionally have the symptom, effect is not severe Occasionally have the symptom, effect is not severe Occasionally have the symptom, effect is not severe   Anxiety, fear, nervousness Frequently have the symptom, effect is not severe Frequently have the symptom, effect is not severe Frequently have the symptom,  effect is not severe Occasionally have the symptom, effect is not severe   Anger, irritability, aggressiveness Occasionally have the symptom, effect is severe Frequently have the symptom, effect is not severe Occasionally have the symptom, effect is not  severe Occasionally have the symptom, effect is not severe   Depression Occasionally have the symptom, effect is severe Occasionally have the symptom, effect is severe Occasionally have the symptom, effect is severe Occasionally have the symptom, effect is not severe   Emotions Total 9 9 7 4    Frequent illness Occasionally have the symptom, effect is severe Frequently have the symptom, effect is severe Frequently have the symptom, effect is severe Occasionally have the symptom, effect is not severe   Frequent or urgent urination Occasionally have the symptom, effect is not severe Frequently have the symptom, effect is not severe Frequently have the symptom, effect is not severe Occasionally have the symptom, effect is not severe   Genital itching or discharge Occasionally have the symptom, effect is not severe Never or almost never have the symptom Never or almost never have the symptom Never or almost never have the symptom   Other Total 4 7 7 2    MSQ Total 152 129 124 104     No flowsheet data found.       __________  Reviewed  Past Surgeries:  Reviewed if from [facility]  Past Surgical History:   Procedure Laterality Date   ??? BONE MARROW BIOPSY      benign   ??? CARDIAC ELECTROPHYSIOLOGY MAPPING AND ABLATION      attempted x 2, states not successful either time   ??? CHOLECYSTECTOMY  2015   ??? DENTAL EXTRACTION Right 04/25/2017    Procedure: EXTRACTION TOOTH # 31, BRIDGE RESECTIONING;  Surgeon: Gladys Damme, DMD;  Location: [facility] OR;  Service: Oral Surgery;  Laterality: Right;   ??? FACIAL COSMETIC SURGERY     ??? HYSTERECTOMY  2009   ??? INSERTION CATHETER VASCULAR ACCESS N/A 09/06/2017    Procedure: INSERTION POWER PORT A CATH;  Surgeon: Penni Bombard, MD;  Location: [facility] OR;  Service: General;  Laterality: N/A;   ??? LIVER BIOPSY      benign hepatic adednoma per patient   ??? REMOVAL HARDWARE LEG Left 08/17/2016    Procedure: REMOVAL OF HARDWARE LEFT TIBIA;  Surgeon: Andee Lineman, MD;  Location: [facility] OR;  Service: Orthopedics;   Laterality: Left;   ??? TIBIA FRACTURE SURGERY     ??? TONSILLECTOMY      + adenoids       Family Hx:  Reviewed  Family History   Problem Relation Age of Onset   ??? Kidney failure Mother    ??? Aneurysm Mother        Social Hx:  Social History     Socioeconomic History   ??? Marital status: Divorced     Spouse name: None   ??? Number of children: None   ??? Years of education: None   ??? Highest education level: None   Occupational History   ??? None   Social Needs   ??? Financial resource strain: None   ??? Food insecurity:     Worry: None     Inability: None   ??? Transportation needs:     Medical: None     Non-medical: None   Tobacco Use   ??? Smoking status: Never Smoker   ??? Smokeless tobacco: Never Used   ??? Tobacco comment: Denies (11/15/2016)   Substance and Sexual Activity   ???  Alcohol use: No     Comment: Denies (11/15/2016)   ??? Drug use: No     Comment: Denies (11/15/2016)   ??? Sexual activity: Not Currently   Lifestyle   ??? Physical activity:     Days per week: None     Minutes per session: None   ??? Stress: None   Relationships   ??? Social connections:     Talks on phone: None     Gets together: None     Attends religious service: None     Active member of club or organization: None     Attends meetings of clubs or organizations: None     Relationship status: None   ??? Intimate partner violence:     Fear of current or ex partner: None     Emotionally abused: None     Physically abused: None     Forced sexual activity: None   Other Topics Concern   ??? Caffeine Use Yes   ??? Occupational Exposure Not Asked   ??? Exercise No   ??? Seat Belt Yes   Social History Narrative   ??? None   \\  Reviewed    Allergies:  Allergies   Allergen Reactions   ??? Acetaminophen Anaphylaxis     Unknown   ??? Adhesive Tape-Silicones Itching, Rash, Swelling and Anaphylaxis     Unknown   ??? Alcohol Anaphylaxis   ??? Amitriptyline Anaphylaxis   ??? Aspirin Anaphylaxis     Unknown   ??? Benzoic Acid Anaphylaxis   ??? Blue Dye Anaphylaxis     Unknown   ??? Epinephrine Anaphylaxis   ???  Flecainide Shortness Of Breath and Anaphylaxis     Itching, decreased libido, wheezing, flushing, numbness.    ??? Gabapentin Anaphylaxis   ??? Iodinated Contrast Media Anaphylaxis   ??? Isopropyl Alcohol Anaphylaxis   ??? Latex Anaphylaxis and Rash   ??? Levofloxacin Itching and Anaphylaxis   ??? Lidocaine Anaphylaxis   ??? Nsaids (Non-Steroidal Anti-Inflammatory Drug) Anaphylaxis   ??? Onion Anaphylaxis   ??? 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   ??? Paraben Anaphylaxis   ??? Perfume Anaphylaxis   ??? Pregabalin Anaphylaxis   ??? Preservative Anaphylaxis   ??? Quetiapine Anaphylaxis     Mental Status Change  Other reaction(s): Angioedema   ??? Red Dye Anaphylaxis     Unknown   ??? Sulfa (Sulfonamide Antibiotics) Anaphylaxis   ??? Sulfur Anaphylaxis     Pt states: Any alcohol product   ??? Trazodone Rash and Anaphylaxis   ??? Verapamil Anaphylaxis   ??? Yellow Dye Anaphylaxis     Unknown   ??? Green Dye Rash     Other reaction(s): Contact Dermatitis   ??? Onion Extract Other (See Comments)   ??? Doxepin Other (See Comments)     Unknown   ??? Duloxetine      Other reaction(s): Other (See Comments)  Mental Status Change   ??? Epi E-Z Pen    ??? Hydroxyzine Itching     Unknown   ??? Montelukast    ??? Statins-Hmg-Coa Reductase Inhibitors    ??? Verapamil (Bulk) Hives     Insomnia, flushing, scattered brain, migraine, itching, anxiety.      MEDS:  Current Outpatient Medications   Medication Sig   ??? blood sugar diagnostic Use 1 test strip twice daily to check blood glucose   ??? blood-glucose meter Use meter to test blood glucose twice daily   ??? busPIRone TAKE 2 TABLETS BY MOUTH 3 TIMES DAILY   ??? busPIRone TAKE 2 TABLETS BY MOUTH 3 TIMES DAILY   ??? cetirizine    ??? cromolyn TAKE  BY MOUTH 4 TIMES DAILY: BEFORE MEALS & AT BEDTIME   ??? cyanocobalamin, vitamin B-12, (VITAMIN B-12 INJ) Inject 0.5 mLs as directed.   ??? diphenhydrAMINE Take 75-150 mg by mouth daily.   ??? doxepin TAKE ONE (1) CAPSULE BY MOUTH AT BEDTIME.   ??? famotidine Take 40 mg by mouth 2 times a day.   ??? fexofenadine Take 1 tablet (180 mg total) by mouth daily.   ??? furosemide Take 1 tablet (20 mg total) by mouth daily.   ??? hydrOXYzine HCl TAKE ONE TABLET BY MOUTH 3 TIMES DAILY AS NEEDED FOR ANXIETY   ??? lancets Use on lancet twice daily to check blood glucose   ??? levalbuterol Inhale 3 mL (1.25 mg total) by nebulization every 6 hours as needed for Wheezing.   ??? levothyroxine Take 1 tablet (137 mcg total) by mouth daily.   ??? metFORMIN TAKE ONE TABLET BY MOUTH TWICE DAILY WITH MEALS   ??? montelukast TAKE ONE TABLET BY MOUTH AT BEDTIME.   ??? nebulizer and compressor aerosol device Use 1 ampule as directed every 4 hours. Indications: Please dispense 1 nebulizer unit   ??? onabotulinumtoxinA (cosmetic) Inject into the muscle.   ??? predniSONE TAKE ONE TABLET BY MOUTH DAILY.   ??? ranitidine Take 1 tablet (300 mg total) by mouth 3 times a day.   ??? lactated ringers Intravenous 1,000 mLs once.   ??? triamcinolone APPLY 2 TIMES DAILY   ??? UNABLE TO FIND COMPOUNDED METHYLCOBALAMIN 1000 MCG EVERY OTHER DAY BY INJECTION/ TRISTATE   ??? VITAMIN D3-VITAMIN K2, MK4, ORAL Take by mouth.     No current facility-administered medications for this visit.      ------  Recent labs:   Reviewed from [facility] and/or Care Everywhere  Some below may be resulted after visit.  Metab:  A1c  Lab Results   Component Value Date    HGBA1C 7.3 (H) 06/09/2017     Fasting insulin: .    Energy:  H/H:   Lab Results   Component Value Date    WBC 12.6 (H) 05/31/2017    HGB 14.8 05/31/2017    HCT 45.2 (H) 05/31/2017    MCV 84.9 05/31/2017    PLT 317 05/31/2017     Iron/Ferritin:   Lab Results   Component Value Date    IRON 73 06/09/2017    TIBC 375 06/09/2017    FERRITIN 46.0  06/09/2017     Vit B12:   Lab Results   Component Value Date    VITAMINB12 163 (L) 06/09/2017     Homocysteine:   Lab Results   Component Value Date    HOMOCYSTEINE 8.8 06/09/2017   ,  MMA:@    Mag:   Magnesium   Date Value Ref Range Status   06/09/2017 2.0 1.5 - 2.5 mg/dL Final     No results found for: MAGNESIUMRB  Vit D:   Lab Results   Component Value Date    VITD25H 37.7 11/10/2017      -----------  Detox:    Lab Results   Component  Value Date    GGT 53 06/09/2017     Lab Results   Component Value Date    ALT 14 10/28/2016     Lab Results   Component Value Date    AST 16 10/28/2016     Lab Results   Component Value Date    CREATININE 0.77 11/10/2017     ------------  Thyroid:  Lab Results   Component Value Date    TSH 3.82 11/10/2017    T3TOTAL 109.3 11/10/2017    FREET4 1.18 11/10/2017    THYROIDAB 747.0 (H) 06/09/2017     No results found for: THGAB  @  ---------  Inflammation:  No results found for: SEDRATE  Lab Results   Component Value Date    CRP 17.8 (H) 06/09/2017   , or   HS CRP No results found for: HSCRP,   No results found for: ANA  No results found for: RF      EXAM:  Vitals:    12/28/17 1006   BP: (!) 134/98   BP Location: Left arm   Patient Position: Sitting   BP Cuff Size: Large   Pulse: 66   SpO2: 93%   Weight: 215 lb 3.2 oz (97.6 kg)   Height: 5' 7 (1.702 m)     Body mass index is 33.71 kg/m??.   Wt Readings from Last 3 Encounters:   12/28/17 215 lb 3.2 oz (97.6 kg)   12/19/17 214 lb 9.6 oz (97.3 kg)   12/08/17 215 lb (97.5 kg)       ==================================  Assessment/ Plan:  See Pt instructions for additional information  I spent___45+__min with this patient in face to face contact, reviewing history with > 90% of time spent in education, plan of care and counselling     1. Mast cell activation syndrome (CMS Dx)     2. CVID (common variable immunodeficiency) (CMS Dx)     3. Multiple allergies     4. Anaphylaxis, subsequent encounter     5. Stress     6. Glucose intolerance  (impaired glucose tolerance)     7. Latex allergy         Plan:  (see AVS and endnotes for more details)  Reviewed additional specific recommendations and TLC (Therapeutic Lifestyle Changes) documented in the AVS with pt who was advised on gradual changes, staggered entry of new components in order to determine pros/cons of various interventions.   Patient Instructions     INTEGRATIVE MEDICINE CHECK LIST   Please refer to the following checklists to verify the wellness advice and further testing we discussed.      TO DO LIST:  [x]    PREVISIT PREPARATION:  PLEASE prepare for your next visit!  Our time together is very short and we need to partner together to accomplish your health goals.    1) Review the recommendations we discussed at this visit below by noting especially those [x]  marks,  AND  2) In the days prior to your next appointment, check your UC MyChart for reminders about electronic responses to        a) the MSQ (Medical Symptom Questionnaire) and        b) the on-line version of the PREVISIT QUESTIONNAIRE (also printed below for ease of access)  ALSO,  Please know that if you show up right at the time of your appointment, the nursing work is part of our 30 minute follow up time slot together but giving her time  before our visit by arriving at least      15 MINUTES ahead of your scheduled appointment will mean more time with the doctor.    See below for details.   [x]    LAB ORDERS PLACED:  If at the end of your visit, please use our [place] office, 1st floor;   if later and/or fasting, you may use any UC lab site preferred.  UC drawn labs will be communicated via email.   If you have blood drawn at a non UC site, we will NOT be able to see those results and may only be able to review them with you at a visit.     Look for a results letter in your UC MyChart message basket 5-7 days after your results are completely back.  >>DIDN'T PUT IN ORDERS BUT WOULD DEF WANT A CBC WITH DIFF AND TPO ANTIBODY LEVEL,  A1C  ? LIPIDS   []    GIMAP STOOL TEST KIT,  (results not visible in UC MyChart)      PLEASE MAKE APPT FOR 3-4 WEEKS ASAP AFTER SENDING IN KIT OR EVEN BEFORE YOU SEND IT IN ; DON'T WAIT FOR [place] TO LET YOU KNOW THE TEST IS BACK, IT WILL NOT SHOW UP IN YOUR MYCHART FILE  >>   []    NEUROLAB [facility]) Neurotransmitter testing along with salivary cortisol/DHEA testing:  We provide the kit, you complete at home and send to [facility], results outside UC, 2-3 weeks turn around time.Marland Kitchen  PLEASE MAKE APPT FOR 3-4 WEEKS ASAP AFTER SENDING IN KIT OR EVEN BEFORE YOU SEND IT IN ; DON'T WAIT FOR [place] TO LET YOU KNOW THE TEST IS BACK, IT WILL NOT SHOW UP IN YOUR MYCHART FILE  >>     []    GENOVA KIT (results not visible in UC MyChart)   MAKE AN APPOINTMENT FOR THE TIMING LISTED (TAT)       >>   [x]    MIND BODY:  MAKE TIME FOR QUIET BREATHING, DISCONNECT FROM PHONE/TEXT/COMPUTER, SPEND TIME IN NATURE, GRATITUDE JOURNAL'  >>KEEP UP WITH ACUPUNCTURE, FOR  NECK AND BACK  >>CONTINUE INSIGHT TIMER,  >> LOOK INTO DAY BRIGHT (SEE BELOW) AND NIGHT DARK (CONSIDER UVEX 'BLUE BLOCKING GLASSES FOR EVENING  >> 8 week MINDFULNESS TRAINING COURSES, CHECK WEBSITE FOR LATEST CLASS SCHEDULE      [x]    GUT HEALTH:  EAT REAL FOOD, NOT TOO MUCH, MOSTLY PLANTS!  >>   [x]    IMMUNE/ ANTI- INFLAMMATION SUPPORT:  TO HELP YOUR BODY DEFEND ITSELF, LIMIT SUGAR TO HELP STRESS SIGNALS, SLEEP TO HELP BRAIN REPAIR,  >>KEEP AN EYE ON WBC COUNT, RUNS HIGH     [x]    HORMONE SUPPORT (ADRENAL, THYROID, INSULIN, GENDER):  SUGAR CAN CAUSE COMMUNICATION IMBALANCES, VITAMIN D IS A HORMONE WE MAKE FROM THE SUN!  >>   [x]    ENERGY REGULATION:  NUTRIENTS SUCH AS VITAMIN B2, B12, IRON AND OXYGEN ARE ESSENTIAL FOR MITOCHONDRIA (YOUR BATTERIES)  >>     [x]    DETOX SUPPORT:  HEALTHY GUT TRANSIT IS THE BEST DETOX! IF THE GUT ISN'T ABLE TO DO IT'S JOB, THE LIVER WILL HAVE TO TAKE UP THE SLACK!  >>BOTOX??     [x]    TRANSPORT/ STRUCTURE:  GETTING NUTRIENTS THROUGHOUT THE BODY AND HOW WE  MOVE THROUGH SPACE ALL CONTRIBUTE IMMENSELY TO OVERALL HEALTH. ...CONSIDER YOUR SPINE AND JOINTS AS STARS IN A CONSTELLATION. MAKE AS MANY CONSTELLATIONS THROUGH YOUR DAY AS POSSIBLE NOT JUST A STATIC FEW  POSITIONS!  >>     []    DIETICIAN VISIT : [facility] is no longer employing a dietitian,  Refer to   [facility], RD  [email or web address]  [phone]  Or  Jarrett Ables, RD  [email or web address]   [phone]    Or her new office with [facility] in [place]     []    BEHAVIORAL HEALTH/ COUNSELOR VISIT Arnetha Massy, PhD, many insurances may cover; please check with your carrier) ; Dr Dan Humphreys can see you for 1-5 visits depending on your needs but is not a long-term-needs counselor; her specialty is MBSR (Mindfulness-based stress reduction) and performance improvement.    SEE COURSE SCHEDULE FOR SMALL GROUP MINDFULNESS/ ANXIETY TREATMENT GROUPS  [email or web address]     []    REFERRALS OR OTHER ORDERS:    (You may not need an actual referral to access these services)  Integrative services like Massage, Acupuncture  Get costs and schedule at our front desk or by calling [phone]. Special arrangements may be negotiated.    Call [phone] for UC imaging orders or   >>   [x]  DIETARY SUGGESTIONS:  EAT REAL FOOD, NOT TOO MUCH, MOSTLY PLANTS!  FOOD IS NFORMATION FOR YOUR BODY.  CONSIDER YOUR KITCHEN YOUR [facility].  YOU MAY WANT TO WORK WITH A FUNCTIONAL DIETITIAN, SEE ABOVE.    >>   []    SUPPLEMENT CHANGES OR ADDITIONS  (If you've been using [employer] on-line portal for your supplements, you will need to start finding those supplements elsewhere on line or visiting local compounding pharmacies to get those same quality products.)  Ask Dr Valeda Malm for her Recommend supplement brands handout if not already provided.    See above or....  >>   []    Rx / PRESCRIPTION written for:  >>        [x]  GROUP CLASSES  INCLUDING  MINDFULNESS TRAINING, see website for classes/times  [facility] ([facility] [place])  5$ a class, THURSDAYS    CHECK OUR WEBSITE FOR CLASS OFFERINGS AND COSTS.  [email or web address]      [x]      FOLLOW UP VISIT    Please make appointment to go over any lab work, don't wait for a reminder from [place], especially for nonUC tests that you will not see in MyChart.  For each follow up appointment, we will be asking you to complete one or two electronic questionnaires before your visit so please check your UCMyChart in the days prior to your next visit and complete these important health status forms.   These help [place] partner with you for your health more efficienctly  Please bring your Integrative Medicine folder, all prior handouts and test reports as well as updated lists of medications and supplements to each visit, since electronic access to your data is not as easy as it should be!      -----------------------------------------  Please review above recommendations and TLC (Therapeutic Lifestyle Changes).   We advise gradual changes, staggered entry of new components in order to determine pros/cons of various interventions.   It took time to get out of balance and restoring balance may be a long, worthwhile journey!  We look forward to partnering with you for your wellness.      Be well,  Phyllis Ginger, MD  UC Dept [facility], Adjunct Associate Professor  Integrative/Functional Medicine Consultant     [facility]  [facility]  [address],  [phone]  [place], [place] [address]  [phone])  [place] [phone]  Fax: [phone]  (clinic hours Mon ALL DAY, Tues PM,  Wednesday AM, Thurs ALL DAY, Fri out of office     Integrative Medicine Program Director: Ester Rink, PhD  Integrative Medicine Clinical Director: Ancil Linsey  Patient Services Representative:    Chief Medical Assistant:  Ms. Ciro Backer  EXTENSION:  [phone]  [x]    ================================================================  IF YOU HAVE PROBLEMS OF MOOD AND/ OR ENERGY, THERE MAY BE HELP WITH LIGHT THERAPY!                                       More in the daytime AND less at night!    Check these websites:  http://psycheducation.org/treatment/bipolar-disorder-light-and-darkness/light-therapies-for-depression/  and  https://www.farmer-stevens.info/ (center for environmental therapeutics)    See you you're a morning or evening person:    Test for Morningness/ Eveningness: http://www.cet-hosting.com/limesurvey/?sid=61524    If you have SAD (Seasonal affective disorder treatment):   Choosing a light box  Light therapy boxes can be ordered. Features such as light intensity, safety, cost and style are important considerations.  By [facility] Staff    Seasonal affective disorder (SAD) is a type of depression that typically occurs each year during fall and winter. Light therapy boxes can offer effective treatment for SAD. Light box therapy may be effective on its own. Or, light therapy may be more effective when it's combined with another SAD treatment, such as an antidepressant medication or psychological counseling (psychotherapy).  Light therapy boxes for SAD treatment are also known as light boxes, bright light therapy boxes and phototherapy boxes.  All light boxes for SAD treatment are designed do the same thing, but one may work better for you than another. Be sure to consult with your doctor so that you get a light therapy box that best suits your needs.  Understanding a light box  A light box mimics outdoor light. Researchers believe this type of light causes a chemical change in the brain that lifts your mood and eases other symptoms of SAD. Most people use light boxes for a minimum of 30 minutes each morning.  You can buy a light box over the counter, or your doctor may recommend a specific light box. Most health insurance plans do not cover the cost.  Light boxes,  available from stores and Internet retailers, come in different shapes and sizes and have varied features. They also produce different types and intensities of light. Light boxes are designed to be safe and effective, but they aren't approved or regulated by the Food and Drug Administration (FDA), so it's important to understand your options.  What to consider  Here are some questions to think about when buying a light box for seasonal affective disorder (SAD):  1) Is the light box made specifically to treat SAD? If not, it may not help your depression. Some light therapy lamps are designed for skin disorders -- not SAD or depression. The light boxes that treat skin disorders emit more ultraviolet (UV) light than the boxes that treat SAD, and they could damage your eyes if used incorrectly.  2) How bright is it? Brighter boxes will require less time to use each day, compared with dimmer boxes, to achieve the same effect. Look for a light box that provides the right intensity of light at a comfortable distance -- ask your health care provider  for recommendations.  3) How much UV light does it release? Light boxes for SAD should be designed to filter out most UV light. Look for a light box that emits as little UV light as possible. Contact the manufacturer for safety information if you have questions.  4) Does it use LEDs? Traditionally, light boxes have used fluorescent or incandescent lights. Some manufacturers now sell light boxes with light-emitting diodes (LEDs).  5) Does it emit blue light? Typically, light boxes use white light, but some light boxes give off blue light with a shorter wavelength. There is much more research available to support the use of bright white light to treat SAD than there is for blue light.  6) Can it cause eye damage? Although eye damage from using a light box is uncommon, blue light may pose a greater risk of harming your eyes than white light does. Check with the manufacturer if you  have concerns about light box safety.  7) Is it the style you need? Some light boxes look like upright lamps, while others are small and rectangular. You can even buy a battery-powered light therapy device attached to a visor, but it isn't clear yet whether this type of light works as well as a Engineer, water. Because the effectiveness of a light box depends on daily use, it's important to buy one that is convenient for you.  8) Can you put it in the right location? Think about where you'll want to place your light box. Keep in mind that most boxes need to be within 2 feet (61 centimeters) of you and hit your eyes from ABOVE your eye level (pointing downward towards you)  9) Does your doctor recommend it? Talk to your health care professional about light box options. Doctors recommend that you be under the care of a health care provider while using light box therapy. If you are experiencing both SAD and bipolar disorder, the advisability and timing of using a light box should be carefully reviewed with your doctor    If you???re not familiar with light therapy for seasonal depression, more information follows below. Basically one sits in front of a box the size of a small suitcase (smaller ones available; more on that below too) which emits a lot of light (10,000 lux), for about 30 minutes to start, and as little as 15 minutes or less later to stay well through the winter    The [place] research is one of the most recent of several well-designed studies which when viewed together suggest that light therapy is an effective treatment for depression, roughly equal to medications in strength    So, if light therapy is that good, why isn???t it more widely recommended? For one thing, early research on light therapy was poorly funded and thus often of very weak Estate agent. Placebo comparison (???control??? treatment) is hard to do. Think about it: how do you put someone on a ???placebo??? treatment that seems like light therapy but  isn???t likely to do anything? It???s not like making an identical placebo pill. Researchers have used dim red light, mis-timed white light, and negative ion generator boxes as ???plausible??? placebo treatments. All of these control treatments have their problems.    Furthermore, as one of the leading researchers pointed out, everyone in the study is likely to get someadditional light exposure just from seeing the sun once in a while. In [place], where Dr. Denny Peon works, that might really be once in long while, in the winter!  But if all patients in a research study are getting some sunlight during the day, at random times during the study, that ought to minimize the difference between the ???treatment??? group that is getting light box treatment, versus the ???control??? group that is getting something else. As Dr. Denny Peon says, it???s as though you were trying to research on Prozac, with one group on the drug and one group getting placebo, but unknown to you, now and then someone was sneaking in and putting little doses of Prozac in the water supply of everyone in the study!    Because of these design problems, and the lack of a major industry to fund research on light treatments, early studies tended to be weak and contributed to the sense that light therapy itself is a weak treatment. And yet the two reports above (one a new study, one a good recent review of previous research) show that light therapy for seasonal mood shifts is not a weak treatment at all. And it may have similar strength even for non-seasonal depression. One study even found that hospital stays for depression were three days shorter for patients whose rooms faced east (thus getting regular morning sunlight), instead of west   Best of all, it is relatively cheap: the newest light boxes cost as little as $130-150 (a link in a moment). Compare the price of medications for a year, plus doctor visits to manage those medications. There are no medication interactions; and  there are almost no side effects. Some people get headaches, some have some eye strain. But the main worry with light therapy is that it will work too much like a regular antidepressant: as with other effective antidepressant treatments, there are numerous reports of hypomania developing during light treatment. One of my patients got a speeding ticket after sitting in front of her box too long -- twice!  Because of this risk, which includes mania and even suicide, you should not attempt light treatment on your own. This must be conducted with your physician as part of the treatment team, which includes planning for managing worsening during treatment. Do not do this on your own    Basic Information About Using Light Therapy  ??? If you need to start with some basics on light therapy, the [facility] site  will help you to learn about light boxes and how to use them.  ??? Meanwhile, here is one of the most interesting research findings I???ve come across in a long time. People with seasonal mood changes have different responses to light in the winter, compared to summer -- in their own retinas  ??? So whatever ???Seasonal Affective Disorder??? is, one thing is for sure: it has a biologic basis. (For more on the biologic basis of depression, see essays on the Brain Chemistry of Depression).  Which Light Box Should I Buy?  (updated 11/2017)                    Follow Up:  No follow-ups on file.  to review tests as specified in AVS       ===============================

## 2017-12-28 NOTE — Unmapped (Addendum)
INTEGRATIVE MEDICINE CHECK LIST   Please refer to the following checklists to verify the wellness advice and further testing we discussed.      TO DO LIST:  [x]    PREVISIT PREPARATION:  PLEASE prepare for your next visit!  Our time together is very short and we need to partner together to accomplish your health goals.    1) Review the recommendations we discussed at this visit below by noting especially those [x]  marks,  AND  2) In the days prior to your next appointment, check your UC MyChart for reminders about electronic responses to        a) the MSQ (Medical Symptom Questionnaire) and        b) the on-line version of the PREVISIT QUESTIONNAIRE (also printed below for ease of access)  ALSO,  Please know that if you show up right at the time of your appointment, the nursing work is part of our 30 minute follow up time slot together but giving her time before our visit by arriving at least      15 MINUTES ahead of your scheduled appointment will mean more time with the doctor.    See below for details.   [x]    LAB ORDERS PLACED:  If at the end of your visit, please use our [place] office, 1st floor;   if later and/or fasting, you may use any UC lab site preferred.  UC drawn labs will be communicated via email.   If you have blood drawn at a non UC site, we will NOT be able to see those results and may only be able to review them with you at a visit.     Look for a results letter in your UC MyChart message basket 5-7 days after your results are completely back.  >>DIDN'T PUT IN ORDERS BUT WOULD DEF WANT A CBC WITH DIFF AND TPO ANTIBODY LEVEL, A1C  ? LIPIDS   []    GIMAP STOOL TEST KIT,  (results not visible in UC MyChart)      PLEASE MAKE APPT FOR 3-4 WEEKS ASAP AFTER SENDING IN KIT OR EVEN BEFORE YOU SEND IT IN ; DON'T WAIT FOR [place] TO LET YOU KNOW THE TEST IS BACK, IT WILL NOT SHOW UP IN YOUR MYCHART FILE  >>   []    NEUROLAB [facility]) Neurotransmitter testing along with salivary cortisol/DHEA testing:  We provide  the kit, you complete at home and send to [facility], results outside UC, 2-3 weeks turn around time.Marland Kitchen  PLEASE MAKE APPT FOR 3-4 WEEKS ASAP AFTER SENDING IN KIT OR EVEN BEFORE YOU SEND IT IN ; DON'T WAIT FOR [place] TO LET YOU KNOW THE TEST IS BACK, IT WILL NOT SHOW UP IN YOUR MYCHART FILE  >>     []    GENOVA KIT (results not visible in UC MyChart)   MAKE AN APPOINTMENT FOR THE TIMING LISTED (TAT)       >>   [x]    MIND BODY:  MAKE TIME FOR QUIET BREATHING, DISCONNECT FROM PHONE/TEXT/COMPUTER, SPEND TIME IN NATURE, GRATITUDE JOURNAL'  >>KEEP UP WITH ACUPUNCTURE, FOR  NECK AND BACK  >>CONTINUE INSIGHT TIMER,  >> LOOK INTO DAY BRIGHT (SEE BELOW) AND NIGHT DARK (CONSIDER UVEX 'BLUE BLOCKING GLASSES FOR EVENING  >> 8 week MINDFULNESS TRAINING COURSES, CHECK WEBSITE FOR LATEST CLASS SCHEDULE      [x]    GUT HEALTH:  EAT REAL FOOD, NOT TOO MUCH, MOSTLY PLANTS!  >>   [x]    IMMUNE/ ANTI- INFLAMMATION  SUPPORT:  TO HELP YOUR BODY DEFEND ITSELF, LIMIT SUGAR TO HELP STRESS SIGNALS, SLEEP TO HELP BRAIN REPAIR,  >>KEEP AN EYE ON WBC COUNT, RUNS HIGH     [x]    HORMONE SUPPORT (ADRENAL, THYROID, INSULIN, GENDER):  SUGAR CAN CAUSE COMMUNICATION IMBALANCES, VITAMIN D IS A HORMONE WE MAKE FROM THE SUN!  >>   [x]    ENERGY REGULATION:  NUTRIENTS SUCH AS VITAMIN B2, B12, IRON AND OXYGEN ARE ESSENTIAL FOR MITOCHONDRIA (YOUR BATTERIES)  >>     [x]    DETOX SUPPORT:  HEALTHY GUT TRANSIT IS THE BEST DETOX! IF THE GUT ISN'T ABLE TO DO IT'S JOB, THE LIVER WILL HAVE TO TAKE UP THE SLACK!  >>BOTOX??     [x]    TRANSPORT/ STRUCTURE:  GETTING NUTRIENTS THROUGHOUT THE BODY AND HOW WE MOVE THROUGH SPACE ALL CONTRIBUTE IMMENSELY TO OVERALL HEALTH. ...CONSIDER YOUR SPINE AND JOINTS AS STARS IN A CONSTELLATION. MAKE AS MANY CONSTELLATIONS THROUGH YOUR DAY AS POSSIBLE NOT JUST A STATIC FEW POSITIONS!  >>     []    DIETICIAN VISIT : [facility] is no longer employing a dietitian,  Refer to   [facility]  RD  [email or web address]  [phone]  Or  Jarrett Ables, RD  [email or web address]   [phone]    Or her new office with [facility] in [place]     []    BEHAVIORAL HEALTH/ COUNSELOR VISIT Arnetha Massy, PhD, many insurances may cover; please check with your carrier) ; Dr Dan Humphreys can see you for 1-5 visits depending on your needs but is not a long-term-needs counselor; her specialty is MBSR (Mindfulness-based stress reduction) and performance improvement.    SEE COURSE SCHEDULE FOR SMALL GROUP MINDFULNESS/ ANXIETY TREATMENT GROUPS  [email or web address]     []    REFERRALS OR OTHER ORDERS:    (You may not need an actual referral to access these services)  Integrative services like Massage, Acupuncture  Get costs and schedule at our front desk or by calling [phone]. Special arrangements may be negotiated.    Call [phone] for UC imaging orders or   >>   [x]  DIETARY SUGGESTIONS:  EAT REAL FOOD, NOT TOO MUCH, MOSTLY PLANTS!  FOOD IS NFORMATION FOR YOUR BODY.  CONSIDER YOUR KITCHEN YOUR [facility].  YOU MAY WANT TO WORK WITH A FUNCTIONAL DIETITIAN, SEE ABOVE.    >>   []    SUPPLEMENT CHANGES OR ADDITIONS  (If you've been using [employer] on-line portal for your supplements, you will need to start finding those supplements elsewhere on line or visiting local compounding pharmacies to get those same quality products.)  Ask Dr Valeda Malm for her Recommend supplement brands handout if not already provided.    See above or....  >>   []    Rx / PRESCRIPTION written for:  >>        [x]  GROUP CLASSES  INCLUDING MINDFULNESS TRAINING, see website for classes/times  [facility] ([facility] [place])  5$ a class, THURSDAYS    CHECK OUR WEBSITE FOR CLASS OFFERINGS AND COSTS.  [email or web address]      [x]      FOLLOW UP VISIT    Please make appointment to go over any lab work, don't wait for a reminder from [place],  especially for nonUC tests that you will not see in MyChart.  For each follow up appointment, we will be asking you to complete one or two electronic questionnaires before your visit so please check your UCMyChart in the days prior  to your next visit and complete these important health status forms.   These help [place] partner with you for your health more efficienctly  Please bring your Integrative Medicine folder, all prior handouts and test reports as well as updated lists of medications and supplements to each visit, since electronic access to your data is not as easy as it should be!      -----------------------------------------  Please review above recommendations and TLC (Therapeutic Lifestyle Changes).   We advise gradual changes, staggered entry of new components in order to determine pros/cons of various interventions.   It took time to get out of balance and restoring balance may be a long, worthwhile journey!  We look forward to partnering with you for your wellness.      Be well,  Phyllis Ginger, MD  UC Dept [facility], Adjunct Associate Professor  Integrative/Functional Medicine Consultant     [facility]  [facility]  [address]  [address]  [place], [place] [address]  [phone])  [place] [phone]  Fax: [phone]  (clinic hours Mon ALL [place], Tues PM,  Wednesday AM, Thurs ALL DAY, Fri out of office     Integrative Medicine Program Director: Ester Rink, PhD  Integrative Medicine Clinical Director: Ancil Linsey  Patient Services Representative:    Chief Medical Assistant:  Ms. Ciro Backer  EXTENSION: [phone]  [x]    ================================================================  IF YOU HAVE PROBLEMS OF MOOD AND/ OR ENERGY, THERE MAY BE HELP WITH LIGHT THERAPY!                                       More in the daytime AND less at night!    Check these  websites:  http://psycheducation.org/treatment/bipolar-disorder-light-and-darkness/light-therapies-for-depression/  and  https://www.farmer-stevens.info/ (center for environmental therapeutics)    See you you're a morning or evening person:    Test for Morningness/ Eveningness: http://www.cet-hosting.com/limesurvey/?sid=61524    If you have SAD (Seasonal affective disorder treatment):   Choosing a light box  Light therapy boxes can be ordered. Features such as light intensity, safety, cost and style are important considerations.  By [facility] Staff    Seasonal affective disorder (SAD) is a type of depression that typically occurs each year during fall and winter. Light therapy boxes can offer effective treatment for SAD. Light box therapy may be effective on its own. Or, light therapy may be more effective when it's combined with another SAD treatment, such as an antidepressant medication or psychological counseling (psychotherapy).  Light therapy boxes for SAD treatment are also known as light boxes, bright light therapy boxes and phototherapy boxes.  All light boxes for SAD treatment are designed do the same thing, but one may work better for you than another. Be sure to consult with your doctor so that you get a light therapy box that best suits your needs.  Understanding a light box  A light box mimics outdoor light. Researchers believe this type of light causes a chemical change in the brain that lifts your mood and eases other symptoms of SAD. Most people use light boxes for a minimum of 30 minutes each morning.  You can buy a light box over the counter, or your doctor may recommend a specific light box. Most health insurance plans do not cover the cost.  Light boxes, available from stores and Internet retailers, come in different shapes  and sizes and have varied features. They also produce different types and intensities of light. Light boxes are designed to be safe and effective, but they aren't approved or regulated by the  Food and Drug Administration (FDA), so it's important to understand your options.  What to consider  Here are some questions to think about when buying a light box for seasonal affective disorder (SAD):  1) Is the light box made specifically to treat SAD? If not, it may not help your depression. Some light therapy lamps are designed for skin disorders -- not SAD or depression. The light boxes that treat skin disorders emit more ultraviolet (UV) light than the boxes that treat SAD, and they could damage your eyes if used incorrectly.  2) How bright is it? Brighter boxes will require less time to use each day, compared with dimmer boxes, to achieve the same effect. Look for a light box that provides the right intensity of light at a comfortable distance -- ask your health care provider for recommendations.  3) How much UV light does it release? Light boxes for SAD should be designed to filter out most UV light. Look for a light box that emits as little UV light as possible. Contact the manufacturer for safety information if you have questions.  4) Does it use LEDs? Traditionally, light boxes have used fluorescent or incandescent lights. Some manufacturers now sell light boxes with light-emitting diodes (LEDs).  5) Does it emit blue light? Typically, light boxes use white light, but some light boxes give off blue light with a shorter wavelength. There is much more research available to support the use of bright white light to treat SAD than there is for blue light.  6) Can it cause eye damage? Although eye damage from using a light box is uncommon, blue light may pose a greater risk of harming your eyes than white light does. Check with the manufacturer if you have concerns about light box safety.  7) Is it the style you need? Some light boxes look like upright lamps, while others are small and rectangular. You can even buy a battery-powered light therapy device attached to a visor, but it isn't clear yet whether this  type of light works as well as a Engineer, water. Because the effectiveness of a light box depends on daily use, it's important to buy one that is convenient for you.  8) Can you put it in the right location? Think about where you'll want to place your light box. Keep in mind that most boxes need to be within 2 feet (61 centimeters) of you and hit your eyes from ABOVE your eye level (pointing downward towards you)  9) Does your doctor recommend it? Talk to your health care professional about light box options. Doctors recommend that you be under the care of a health care provider while using light box therapy. If you are experiencing both SAD and bipolar disorder, the advisability and timing of using a light box should be carefully reviewed with your doctor    If you???re not familiar with light therapy for seasonal depression, more information follows below. Basically one sits in front of a box the size of a small suitcase (smaller ones available; more on that below too) which emits a lot of light (10,000 lux), for about 30 minutes to start, and as little as 15 minutes or less later to stay well through the winter    The [place] research is one of the most  recent of several well-designed studies which when viewed together suggest that light therapy is an effective treatment for depression, roughly equal to medications in strength    So, if light therapy is that good, why isn???t it more widely recommended? For one thing, early research on light therapy was poorly funded and thus often of very weak Estate agent. Placebo comparison (???control??? treatment) is hard to do. Think about it: how do you put someone on a ???placebo??? treatment that seems like light therapy but isn???t likely to do anything? It???s not like making an identical placebo pill. Researchers have used dim red light, mis-timed white light, and negative ion generator boxes as ???plausible??? placebo treatments. All of these control treatments have their  problems.    Furthermore, as one of the leading researchers pointed out, everyone in the study is likely to get someadditional light exposure just from seeing the sun once in a while. In [place], where Dr. Denny Peon works, that might really be once in long while, in the winter! But if all patients in a research study are getting some sunlight during the day, at random times during the study, that ought to minimize the difference between the ???treatment??? group that is getting light box treatment, versus the ???control??? group that is getting something else. As Dr. Denny Peon says, it???s as though you were trying to research on Prozac, with one group on the drug and one group getting placebo, but unknown to you, now and then someone was sneaking in and putting little doses of Prozac in the water supply of everyone in the study!    Because of these design problems, and the lack of a major industry to fund research on light treatments, early studies tended to be weak and contributed to the sense that light therapy itself is a weak treatment. And yet the two reports above (one a new study, one a good recent review of previous research) show that light therapy for seasonal mood shifts is not a weak treatment at all. And it may have similar strength even for non-seasonal depression. One study even found that hospital stays for depression were three days shorter for patients whose rooms faced east (thus getting regular morning sunlight), instead of west   Best of all, it is relatively cheap: the newest light boxes cost as little as $130-150 (a link in a moment). Compare the price of medications for a year, plus doctor visits to manage those medications. There are no medication interactions; and there are almost no side effects. Some people get headaches, some have some eye strain. But the main worry with light therapy is that it will work too much like a regular antidepressant: as with other effective antidepressant treatments, there are  numerous reports of hypomania developing during light treatment. One of my patients got a speeding ticket after sitting in front of her box too long -- twice!  Because of this risk, which includes mania and even suicide, you should not attempt light treatment on your own. This must be conducted with your physician as part of the treatment team, which includes planning for managing worsening during treatment. Do not do this on your own    Basic Information About Using Light Therapy  ??? If you need to start with some basics on light therapy, the [facility] site  will help you to learn about light boxes and how to use them.  ??? Meanwhile, here is one of the most interesting research findings I???ve come across in a long  time. People with seasonal mood changes have different responses to light in the winter, compared to summer -- in their own retinas  ??? So whatever ???Seasonal Affective Disorder??? is, one thing is for sure: it has a biologic basis. (For more on the biologic basis of depression, see essays on the Brain Chemistry of Depression).  Which Light Box Should I Buy?  (updated 11/2017)

## 2017-12-29 ENCOUNTER — Ambulatory Visit: Payer: PRIVATE HEALTH INSURANCE

## 2017-12-29 ENCOUNTER — Encounter: Payer: PRIVATE HEALTH INSURANCE | Attending: Psychiatry

## 2017-12-30 ENCOUNTER — Ambulatory Visit: Payer: PRIVATE HEALTH INSURANCE

## 2018-01-06 ENCOUNTER — Encounter: Payer: PRIVATE HEALTH INSURANCE | Attending: Pain Medicine

## 2018-01-12 MED ORDER — hydrOXYzine HCl (ATARAX) 25 MG tablet
25 | ORAL_TABLET | Freq: Three times a day (TID) | ORAL | 0 refills | Status: AC | PRN
Start: 2018-01-12 — End: 2018-01-26

## 2018-01-12 MED ORDER — busPIRone (BUSPAR) 10 MG tablet
10 | ORAL_TABLET | Freq: Three times a day (TID) | ORAL | 0 refills | Status: AC
Start: 2018-01-12 — End: 2018-01-26

## 2018-01-14 MED ORDER — cromolyn (GASTROCROM) 100 mg/5 mL solution
100 | ORAL | 0 refills | 24.00000 days | Status: AC
Start: 2018-01-14 — End: 2018-03-28

## 2018-01-19 NOTE — Unmapped (Signed)
Patient calling to advise that [facility] has been trying since the beginning of December to get an updated order for IV lactated ringers and the supplies to give. Patient is currently two day behind. Patient had not had IV fluids in one week. [facility] can be reached at [phone]. Patient can be reached at [phone]

## 2018-01-19 NOTE — Unmapped (Signed)
January 19, 2018 10:57 AM    Margie with [facility] has called to request the following order:     ?? Hydration  ?? Diphenhydramine IV    She noted the patient has informed her of the need for these orders.    Please provide a verbal by contacting [phone] or fax to #[phone].

## 2018-01-19 NOTE — Unmapped (Signed)
Again, verbal order given and they are aware orders are on physicians desk to be signed and will be faxed to them as soon as they are signed. Verbalized understanding.

## 2018-01-19 NOTE — Unmapped (Signed)
Called pt to let her know we left verbal orders with pharmacy line for ok to get her IVF's. Pt stated she had a reaction to banana's and grapes in December.Will need IV Benadryl added to the order sheet. She is doing well now. Will let Dr. Lourdes Sledge know this.

## 2018-01-24 ENCOUNTER — Ambulatory Visit: Admit: 2018-01-24 | Payer: PRIVATE HEALTH INSURANCE | Attending: Pain Medicine

## 2018-01-24 ENCOUNTER — Inpatient Hospital Stay: Admit: 2018-01-24 | Payer: PRIVATE HEALTH INSURANCE | Attending: Pain Medicine

## 2018-01-24 DIAGNOSIS — M5031 Other cervical disc degeneration,  high cervical region: Secondary | ICD-10-CM

## 2018-01-24 DIAGNOSIS — G894 Chronic pain syndrome: Secondary | ICD-10-CM
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
## 2018-01-26 ENCOUNTER — Ambulatory Visit: Admit: 2018-01-26 | Payer: PRIVATE HEALTH INSURANCE | Attending: Psychiatry

## 2018-01-26 DIAGNOSIS — F419 Anxiety disorder, unspecified: Secondary | ICD-10-CM

## 2018-01-26 MED ORDER — busPIRone (BUSPAR) 10 MG tablet
10 | ORAL_TABLET | Freq: Three times a day (TID) | ORAL | 2 refills | Status: AC
Start: 2018-01-26 — End: 2018-04-27

## 2018-01-26 MED ORDER — hydrOXYzine HCl (ATARAX) 25 MG tablet
25 | ORAL_TABLET | Freq: Three times a day (TID) | ORAL | 2 refills | Status: AC | PRN
Start: 2018-01-26 — End: 2018-04-27

## 2018-01-26 NOTE — Unmapped (Signed)
Visit Start Time: 3:45 PM   Visit Quit Time: 4:30 PM    No chief complaint on file.    Context:??somatic symptoms  Location: Altered mental status of anxiety  Duration: chronic  Severity:??mild  Associated Symptoms: anxiety  Modifying Factors:improving        History of Present Illness:   Emily Bonilla is a 58 y.o. female with PMH unspecified anxiety disorder, atrial fibrillation, WPW, idiopathic mast cell activation, anemia, asthma, hypothyroidism, GERD, intermittent chronic HA, PNES, presents for follow-up.     HPI     Anxiety doing well. Following with functional medicine, established w/ pain. Holidays were not great, brother forgot to pick her up, Christmas was alone. Mother is scheduled for aortic aneurysm surgery, patient believes she will not survive it. Continues to meditate twice a day. Pleasant and polite on interview. Denies SI/HI/AVH.     Patient Health Questionnaire (PHQ-9) 06/07/2017 09/29/2017   Little interest or pleasure in doing things 0 1   Feeling down, depressed, or hopeless 0 0   Trouble falling or staying asleep, or sleeping too much - 1   Feeling tired or having little energy - 3   Poor appetite or overeating - 1   Feeling bad about yourself - or that you are a failure or have let yourself or your family down - 0   Trouble concentrating on things, such as reading the newspaper or watching television - 1   Moving or speaking so slowly that other people could have noticed. Or the opposite - being so fidgety or restless that you have been moving around a lot more than usual - 0   Thoughts that you would be better off dead, or of hurting yourself in some way - 0   PHQ-9 Total Score - 7   If you checked off any problems, how difficult have these problems made it for you to do your work, take care of things at home, or get along with other people? - Somewhat difficult             Patient History:     Psychiatric History:     Prior Inpatient Psychiatric Hospitalization(s): denies  Prior  Psychotropic Medications: reports taking prior psychotropic medications.  Current Psychotropic Medications: yes - see below current psychotropic medications.  SA: denies    Past Medical History:   Diagnosis Date   ??? Anaphylaxis    ??? Anemia    ??? Anxiety    ??? Asthma    ??? Atrial fibrillation (CMS Dx)    ??? Bronchitis    ??? Heart disease    ??? Neurological disease    ??? OSA (obstructive sleep apnea)     states resolved   ??? Osteoporosis    ??? Psychogenic nonepileptic seizure    ??? Seizures (CMS Dx)    ??? Spontaneous pneumothorax     states pneumomediatsium/pneumopericardium, self resolved   ??? Thyroid disease    ??? Ulcer    ??? WPW (Wolff-Parkinson-White syndrome)        Past Surgical History:   Procedure Laterality Date   ??? BONE MARROW BIOPSY      benign   ??? CARDIAC ELECTROPHYSIOLOGY MAPPING AND ABLATION      attempted x 2, states not successful either time   ??? CHOLECYSTECTOMY  2015   ??? DENTAL EXTRACTION Right 04/25/2017    Procedure: EXTRACTION TOOTH # 31, BRIDGE RESECTIONING;  Surgeon: Gladys Damme, DMD;  Location: [facility] OR;  Service: Oral Surgery;  Laterality: Right;   ???  FACIAL COSMETIC SURGERY     ??? HYSTERECTOMY  2009   ??? INSERTION CATHETER VASCULAR ACCESS N/A 09/06/2017    Procedure: INSERTION POWER PORT A CATH;  Surgeon: Penni Bombard, MD;  Location: [facility] OR;  Service: General;  Laterality: N/A;   ??? LIVER BIOPSY      benign hepatic adednoma per patient   ??? REMOVAL HARDWARE LEG Left 08/17/2016    Procedure: REMOVAL OF HARDWARE LEFT TIBIA;  Surgeon: Andee Lineman, MD;  Location: [facility] OR;  Service: Orthopedics;  Laterality: Left;   ??? TIBIA FRACTURE SURGERY     ??? TONSILLECTOMY      + adenoids       Social History     Socioeconomic History   ??? Marital status: Divorced     Spouse name: Not on file   ??? Number of children: Not on file   ??? Years of education: Not on file   ??? Highest education level: Not on file   Occupational History   ??? Not on file   Social Needs   ??? Financial resource strain: Not on file   ??? Food insecurity:     Worry:  Not on file     Inability: Not on file   ??? Transportation needs:     Medical: Not on file     Non-medical: Not on file   Tobacco Use   ??? Smoking status: Never Smoker   ??? Smokeless tobacco: Never Used   ??? Tobacco comment: Denies (11/15/2016)   Substance and Sexual Activity   ??? Alcohol use: No     Comment: Denies (11/15/2016)   ??? Drug use: No     Comment: Denies (11/15/2016)   ??? Sexual activity: Not Currently   Lifestyle   ??? Physical activity:     Days per week: Not on file     Minutes per session: Not on file   ??? Stress: Not on file   Relationships   ??? Social connections:     Talks on phone: Not on file     Gets together: Not on file     Attends religious service: Not on file     Active member of club or organization: Not on file     Attends meetings of clubs or organizations: Not on file     Relationship status: Not on file   ??? Intimate partner violence:     Fear of current or ex partner: Not on file     Emotionally abused: Not on file     Physically abused: Not on file     Forced sexual activity: Not on file   Other Topics Concern   ??? Caffeine Use Yes   ??? Occupational Exposure Not Asked   ??? Exercise No   ??? Seat Belt Yes   Social History Narrative   ??? Not on file       Family History   Problem Relation Age of Onset   ??? Kidney failure Mother    ??? Aneurysm Mother        Review of Systems    Denies CP, SOB, nausea, vomiting    Allergies:   Acetaminophen; Adhesive tape-silicones; Alcohol; Amitriptyline; Aspirin; Benzoic acid; Blue dye; Epinephrine; Flecainide; Gabapentin; Iodinated contrast media; Isopropyl alcohol; Latex; Levofloxacin; Lidocaine; Nsaids (non-steroidal anti-inflammatory drug); Onion; Other; Paraben; Perfume; Pregabalin; Preservative; Quetiapine; Red dye; Sulfa (sulfonamide antibiotics); Sulfur; Trazodone; Verapamil; Yellow dye; Green dye; Onion extract; Doxepin; Duloxetine; Epi e-z pen; Hydroxyzine; Montelukast; Statins-hmg-coa reductase inhibitors; and Verapamil (bulk)  Medications:     Outpatient  Encounter Medications as of 01/26/2018   Medication Sig Dispense Refill   ??? blood sugar diagnostic Strp Use 1 test strip twice daily to check blood glucose 100 strip 2   ??? blood-glucose meter Misc Use meter to test blood glucose twice daily 1 each 0   ??? busPIRone (BUSPAR) 10 MG tablet TAKE 2 TABLETS BY MOUTH 3 TIMES DAILY 180 tablet 0   ??? busPIRone (BUSPAR) 10 MG tablet TAKE 2 TABLETS BY MOUTH 3 TIMES DAILY 180 tablet 0   ??? busPIRone (BUSPAR) 10 MG tablet Take 2 tablets (20 mg total) by mouth 3 times a day for 15 days. 90 tablet 0   ??? cetirizine (ZYRTEC) 10 mg Cap      ??? cromolyn (GASTROCROM) 100 mg/5 mL solution TAKE BY MOUTH 4 TIMES DAILY: BEFORE MEALS & AT BEDTIME 480 mL 0   ??? cyanocobalamin, vitamin B-12, (VITAMIN B-12 INJ) Inject 0.5 mLs as directed.     ??? diphenhydrAMINE (BENADRYL) 25 mg capsule Take 75-150 mg by mouth daily.     ??? doxepin (SINEQUAN) 10 MG capsule TAKE ONE (1) CAPSULE BY MOUTH AT BEDTIME. 30 capsule 11   ??? famotidine (PEPCID) 40 MG tablet Take 40 mg by mouth 2 times a day.     ??? fexofenadine (ALLEGRA) 180 MG tablet Take 1 tablet (180 mg total) by mouth daily. 30 tablet 3   ??? furosemide (LASIX) 20 MG tablet Take 1 tablet (20 mg total) by mouth daily. 90 tablet 3   ??? hydrOXYzine HCl (ATARAX) 25 MG tablet Take 1 tablet (25 mg total) by mouth every 8 hours as needed for Itching for up to 15 days. 45 tablet 0   ??? lancets Misc Use on lancet twice daily to check blood glucose 200 each 1   ??? levalbuterol (XOPENEX) 1.25 mg/3 mL nebulizer solution Inhale 3 mL (1.25 mg total) by nebulization every 6 hours as needed for Wheezing. 72 mL 0   ??? levothyroxine (SYNTHROID, LEVOTHROID) 137 MCG tablet Take 1 tablet (137 mcg total) by mouth daily. 30 tablet 11   ??? metFORMIN (GLUCOPHAGE) 500 MG tablet TAKE ONE TABLET BY MOUTH TWICE DAILY WITH MEALS 60 tablet 11   ??? montelukast (SINGULAIR) 10 mg tablet TAKE ONE TABLET BY MOUTH AT BEDTIME. 30 tablet 11   ??? nebulizer and compressor Devi Use 1 ampule as directed  every 4 hours. Indications: Please dispense 1 nebulizer unit 1 each 0   ??? onabotulinumtoxinA (BOTOX COSMETIC) 50 unit SolR Inject into the muscle.     ??? predniSONE (DELTASONE) 20 MG tablet TAKE ONE TABLET BY MOUTH DAILY. 30 tablet 0   ??? ranitidine (ZANTAC) 300 MG tablet Take 1 tablet (300 mg total) by mouth 3 times a day. 90 tablet 11   ??? Ringer's solution,lactated (LACTATED RINGERS) infusion Intravenous 1,000 mLs once.     ??? triamcinolone (KENALOG) 0.1 % ointment APPLY 2 TIMES DAILY 80 g 11   ??? UNABLE TO FIND COMPOUNDED METHYLCOBALAMIN 1000 MCG EVERY OTHER DAY BY INJECTION/ TRISTATE 1000 mL 11   ??? VITAMIN D3-VITAMIN K2, MK4, ORAL Take by mouth.       No facility-administered encounter medications on file as of 01/26/2018.         Outpatient Medications Prior to Visit   Medication Sig Dispense Refill   ??? blood sugar diagnostic Strp Use 1 test strip twice daily to check blood glucose 100 strip 2   ??? blood-glucose meter Misc Use  meter to test blood glucose twice daily 1 each 0   ??? busPIRone (BUSPAR) 10 MG tablet TAKE 2 TABLETS BY MOUTH 3 TIMES DAILY 180 tablet 0   ??? busPIRone (BUSPAR) 10 MG tablet TAKE 2 TABLETS BY MOUTH 3 TIMES DAILY 180 tablet 0   ??? busPIRone (BUSPAR) 10 MG tablet Take 2 tablets (20 mg total) by mouth 3 times a day for 15 days. 90 tablet 0   ??? cetirizine (ZYRTEC) 10 mg Cap      ??? cromolyn (GASTROCROM) 100 mg/5 mL solution TAKE BY MOUTH 4 TIMES DAILY: BEFORE MEALS & AT BEDTIME 480 mL 0   ??? cyanocobalamin, vitamin B-12, (VITAMIN B-12 INJ) Inject 0.5 mLs as directed.     ??? diphenhydrAMINE (BENADRYL) 25 mg capsule Take 75-150 mg by mouth daily.     ??? doxepin (SINEQUAN) 10 MG capsule TAKE ONE (1) CAPSULE BY MOUTH AT BEDTIME. 30 capsule 11   ??? famotidine (PEPCID) 40 MG tablet Take 40 mg by mouth 2 times a day.     ??? fexofenadine (ALLEGRA) 180 MG tablet Take 1 tablet (180 mg total) by mouth daily. 30 tablet 3   ??? furosemide (LASIX) 20 MG tablet Take 1 tablet (20 mg total) by mouth daily. 90 tablet 3   ???  hydrOXYzine HCl (ATARAX) 25 MG tablet Take 1 tablet (25 mg total) by mouth every 8 hours as needed for Itching for up to 15 days. 45 tablet 0   ??? lancets Misc Use on lancet twice daily to check blood glucose 200 each 1   ??? levalbuterol (XOPENEX) 1.25 mg/3 mL nebulizer solution Inhale 3 mL (1.25 mg total) by nebulization every 6 hours as needed for Wheezing. 72 mL 0   ??? levothyroxine (SYNTHROID, LEVOTHROID) 137 MCG tablet Take 1 tablet (137 mcg total) by mouth daily. 30 tablet 11   ??? metFORMIN (GLUCOPHAGE) 500 MG tablet TAKE ONE TABLET BY MOUTH TWICE DAILY WITH MEALS 60 tablet 11   ??? montelukast (SINGULAIR) 10 mg tablet TAKE ONE TABLET BY MOUTH AT BEDTIME. 30 tablet 11   ??? nebulizer and compressor Devi Use 1 ampule as directed every 4 hours. Indications: Please dispense 1 nebulizer unit 1 each 0   ??? onabotulinumtoxinA (BOTOX COSMETIC) 50 unit SolR Inject into the muscle.     ??? predniSONE (DELTASONE) 20 MG tablet TAKE ONE TABLET BY MOUTH DAILY. 30 tablet 0   ??? ranitidine (ZANTAC) 300 MG tablet Take 1 tablet (300 mg total) by mouth 3 times a day. 90 tablet 11   ??? Ringer's solution,lactated (LACTATED RINGERS) infusion Intravenous 1,000 mLs once.     ??? triamcinolone (KENALOG) 0.1 % ointment APPLY 2 TIMES DAILY 80 g 11   ??? UNABLE TO FIND COMPOUNDED METHYLCOBALAMIN 1000 MCG EVERY OTHER DAY BY INJECTION/ TRISTATE 1000 mL 11   ??? VITAMIN D3-VITAMIN K2, MK4, ORAL Take by mouth.       No facility-administered medications prior to visit.        Objective:   Physical Exam  Vitals:    01/26/18 1311   BP: 153/85   Pulse: 86   Resp: 18   SpO2: 95%       Gait and Stations: Normal    Mental Status Exam:   Appearance: middle-aged Caucasian female, appears stated age, good hygiene and grooming, fair eye contact, cooperative  Activity: no PMR or PMA, no abnormal movements noted  Speech: hyper-verbal and mildly pressured but easily able to be interrupted, at times loud,??normal latency  of response, fluent with no aphasia  Mood/Affect:  good affect congruent with stated mood, full range, euthymic  Thought Process: organized, linear, goal-directed, no LOA or FOI  Thought Content: talks about feeling well but is very focused on her health symptoms, denies SI, does not express HI, no delusions expressed  Perceptions: does not appear to be internally preoccupied or responding to hallucinations  Attention: fair to conversation  Fund of Knowledge:average  Memory:appropriate to conversation  Insight/Judgement: fair/fair    PHQ-9 Scores:   PHQ Total Score 03/30/2016 05/21/2016 06/24/2016 08/26/2016 09/29/2017   PHQ-9 Total Score 0 0 0 9 7       GAD7 Scores:   GAD7Total Score 08/26/2016 11/25/2016 02/03/2017 04/14/2017 09/29/2017   GAD-7 Total Score 10 6 2 6 4           Assessment/Plan:     Diagnoses:  -Somatic symptom disorder  -Unspecified anxiety disorder, mild  -Cluster B personality traits (particularly histrionic)      Her anxiety has significantly improved on the current medication regimen and with treatment through integrative medicine.  Will continue current medication regimen given beneficial effects.      # Anxiety, mild, significantly improved:  -Continue Buspirone 20 mg TID for anxiety (removed redundant scripts in system)  -Continue Hydroxyzine 25 mg TID PRN for anxiety. Encouraged to try taking medication PRN as opposed to scheduled due to risk of cognitive decline with long-term use of anti-histamine  -Continue treatment with integrative medicine for biofeedback, massage therapy and acupuncture  -Self-management: meditation, yoga  -RTC in 12 weeks  ??  Discussed risks/benefits/side effects/alternatives to medications. Discussed the risk of cognitive decline with long-term use of anti-histamine. Patient expressed understanding of and agreement to the current treatment plan.   ??  # Safety: The patient's imminent risk appears low, as she denies SI, does not express HI, no previous SA, future-oriented and goal-oriented to continue treatment. Patient expressed  understanding of resources, including 911 and PES.  ??  # Substance:  -No active issues  ??  # Medical:  -Follow-up with PCP      Olga Millers, MD  ??  Patient plan discussed and agreed upon by attending Dr. Thomasena Edis

## 2018-01-27 MED ORDER — predniSONE (DELTASONE) 10 MG tablet
10 | ORAL_TABLET | Freq: Every day | ORAL | 1 refills | Status: AC
Start: 2018-01-27 — End: 2019-05-17

## 2018-01-27 NOTE — Unmapped (Signed)
Called patient discussed cervical and lumbar spine films. All questions answered    Aldean Baker MD  Pain Medicine Faculty   Assistant Professor of Anesthesiology  Department of Anesthesiology   [facility]

## 2018-01-27 NOTE — Unmapped (Signed)
Called the patient back. She reports that her allergies have been acting up for the past several weeks. We have discussed action plan in the past which included having some prednisone on hand for very severe reactions to prevent visits to Emergency Room. She advised me that she will only take prednisone during reactions including lip swelling and severe urticaria and will do tapering dose as discussed.     Matilde Sprang, D.O.  Allergy and Immunology Fellow, PGY-5  Pager: [phone]  Cell phone: [phone]

## 2018-02-16 ENCOUNTER — Ambulatory Visit: Payer: PRIVATE HEALTH INSURANCE | Attending: "Endocrinology

## 2018-02-23 NOTE — Unmapped (Signed)
Patient called stating her insurance does not think she needs PT therefore they are not approving to do it. According to your office notes you wanted her to do this before doing the procedure.  Please advise

## 2018-02-24 NOTE — Unmapped (Addendum)
Submitted request for CESI today to [facility], pending response      Received DENIAL from [place] stating: 'The information submitted shows that you have neck pain. The information sent in does not show that you have failed a four week course of conservative treatments including activity modification, medications, and physical therapy or supervised home exercises. It does not show substantial imaging abnormalities. Your provider must show medical need before this request can be approved.'      Please advise

## 2018-03-01 ENCOUNTER — Ambulatory Visit: Payer: PRIVATE HEALTH INSURANCE

## 2018-03-01 MED ORDER — TRUE METRIX GLUCOSE TEST STRIP Strp
ORAL_STRIP | 11 refills | 30.00000 days | Status: AC
Start: 2018-03-01 — End: 2019-03-05

## 2018-03-02 ENCOUNTER — Ambulatory Visit: Payer: PRIVATE HEALTH INSURANCE

## 2018-03-07 ENCOUNTER — Ambulatory Visit: Payer: PRIVATE HEALTH INSURANCE

## 2018-03-08 ENCOUNTER — Ambulatory Visit: Admit: 2018-03-08 | Discharge: 2018-03-08 | Payer: PRIVATE HEALTH INSURANCE

## 2018-03-08 DIAGNOSIS — F439 Reaction to severe stress, unspecified: Secondary | ICD-10-CM

## 2018-03-08 NOTE — Unmapped (Signed)
Addended by: Aldean Baker on: 03/08/2018 08:03 AM     Modules accepted: Orders

## 2018-03-08 NOTE — Unmapped (Signed)
In: 1015am  Out: ??1110am  ??  Chief Issue: stress/anxiety about health??  ??  Subjective:   Pt discussed additional symptoms that she has from her disordered health. ??Since our last visit has been doing much better physically due to the work she has accomplished through Dr. Valeda Malm.  Came in very upset and crying today, which is unusual, and discussed interactions she had that morning with her aide.  Discussed what she has been going through with multiple ones since last visit.  Physically she is doing a bit better, but emotionally she has been having difficulty trying to manage her mood.  Discussed that she is just terribly tired of dealing with the inadequacy of the aids and went into a couple of legitimate examples.  Doesn't have any family or any friends physically here with her so it makes life that much more difficult.  Discussed that she may be able to switch agencies soon.  Pain has been somewhat better in general.  Sleep erratic, although some days she reported getting a good night sleep.  connecting with church via skype.   Discussed disappointing family situation that happened around [place] time.  Didn't end up seeing family.   Mother and brother are both currently in ill health, mother more dire.  She feels extra bad about not being able to help them.    ??  Objective:   Very optimistic and positive on the outside especially with what she is managing health and financially.  MSE  Motor Behavior:??no psychomotor abnormalities  ??  Cognition: alert  ??  Attitude: cooperative  ??  Affect: tearful, appropriate to thought and anxious  ??  Appearance: appropriately dressed  ??  Speech: normal rate  ??  Mood:??depressed, consistent and anxious  ??  Thought Processes:??well organized and no looseness of association??  ??  Perceptions:??no hallucinations or other abnormalities????  ??  Thought content:??no delusions  ??  Insight/ judgement:??no impairments  ??  Suicidal ideation:??none  ??  Homicidal ideation:??none  ??  Symptoms  observed during session:  Agitation,??anhedonia,??less anxiety/fear,????decreased energy/fatigue, mildly??depressed,??emotional lability, sleep disturbance, Open coachable motivated  ??  Assessment:   ??  Intervention strategies implemented and session focus was cognitive behavioral,??insight-oriented,??behavior modification,??supportive,??mindfulness strategies  ??  Plan:   ??  Homework:   Practice breathing techniques  Continuing physical exercise  Follow bedtime routine with consistency  Continue to seek out groups that she can potentially get involved in  Seeing acupuncturist at end of month  End contract with nursing aide agency  Practice meditation exercises at least twice/day  Make sure keeping her own emotional health in the forefront - not giving energy to those that don't have her best interest in mind.  ??  ??  Future treatment/follow-up issues:  Few weeks

## 2018-03-08 NOTE — Unmapped (Signed)
Orders Placed This Encounter   Procedures   ??? Physical Therapy     Referral Priority:   Routine     Referral Type:   Physical Therapy     Requested Specialty:   Physical Therapy     Number of Visits Requested:   1     Please print and mail referral

## 2018-03-08 NOTE — Telephone Encounter (Addendum)
Spoke with patient to discuss today - She states that she tried to have PT done at home, however the therapist that came to her home stated that [facility] would not allow further visits as they don't see a reason for it be medically necessary. She states that she was seen for 3 weeks at home and was making progress, but then would not approve further as they stated the documentation received did not show medical necessity for further treatment. She also stated she was given several exercises to perform (HEP). Patient was being seen by PT at [facility], phone [phone]. Patient states she is going to contact her CM with [facility], phone [phone] to see what else can be done. Patient is willing to continue with PT if can get it approved.     Patient will have PT eval and last PT visit note sent to [place] for review to determine next step.

## 2018-03-09 NOTE — Unmapped (Signed)
Incoming call from patient today stating she talked to her CM at [facility] who advised her to tell us to submit an appeal at this time. Patient had home therapy from 02/05/2018 - 02/22/2018 and then further visits were denied. She also states that the homecare agency she is using messed up the PT referral and put it under her oncologist as the referring instead of [place]. Because of putting it under the oncologist, they didn't show medical need to continue PT as he only sees her for her IV needs. Therefore, [facility]o never received our documentation showing the medical necessity for continued PT. Her CM [name] said when we write the appeal letter to include above explaining what happened and that it should get approved. Patient is going to have the PT eval and notes sent over also to include in the appeal.
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
## 2018-03-16 ENCOUNTER — Ambulatory Visit: Admit: 2018-03-16 | Payer: PRIVATE HEALTH INSURANCE

## 2018-03-16 DIAGNOSIS — M5442 Lumbago with sciatica, left side: Secondary | ICD-10-CM

## 2018-03-16 NOTE — Unmapped (Signed)
PRESENTING COMPLAINT/S  Chief Complaint   Patient presents with   ??? Back Pain       *PATIENT IS ALLERGIC TO ALCOHOL*      RESPONSE TO PREVIOUS TREATMENT/S    Patient saw Misun in [place] which she felt helped her back pain    NEW OBSERVATION    N/A      POINTS TREATED     Lower Limbs  BL60 SP6 LR3 GB41    Upper Limbs  LI4 SI4 SJ5    Torso  N/A    Head  N/A    Ear: N/A    Needles were removed after 30 min      ACCESSORIES     TDP Lamp    INSTRUCTIONS    N/A

## 2018-03-17 ENCOUNTER — Ambulatory Visit: Payer: PRIVATE HEALTH INSURANCE | Attending: "Endocrinology

## 2018-03-20 ENCOUNTER — Ambulatory Visit: Admit: 2018-03-20 | Discharge: 2018-03-20 | Payer: PRIVATE HEALTH INSURANCE

## 2018-03-20 ENCOUNTER — Ambulatory Visit: Admit: 2018-03-20 | Payer: PRIVATE HEALTH INSURANCE | Attending: Family

## 2018-03-20 ENCOUNTER — Ambulatory Visit: Admit: 2018-03-20 | Discharge: 2018-03-20 | Payer: PRIVATE HEALTH INSURANCE | Attending: Audiologist-Hearing Aid Fitter

## 2018-03-20 DIAGNOSIS — H9313 Tinnitus, bilateral: Secondary | ICD-10-CM

## 2018-03-20 DIAGNOSIS — H8110 Benign paroxysmal vertigo, unspecified ear: Secondary | ICD-10-CM

## 2018-03-20 DIAGNOSIS — G43719 Chronic migraine without aura, intractable, without status migrainosus: Secondary | ICD-10-CM

## 2018-03-20 MED ORDER — sodium chloride 0.9%
INTRAMUSCULAR | Status: AC
Start: 2018-03-20 — End: 2018-03-20

## 2018-03-20 MED ORDER — botulinum toxin A (BOTOX) injection 200 Units
100 | Freq: Once | INTRAMUSCULAR | Status: AC
Start: 2018-03-20 — End: 2018-03-20
  Administered 2018-03-20: 16:00:00 200 [IU] via INTRAMUSCULAR

## 2018-03-20 MED ORDER — botulinum toxin A (BOTOX) 100 unit injection
100 | INTRAMUSCULAR | Status: AC
Start: 2018-03-20 — End: 2018-03-20

## 2018-03-20 MED FILL — SODIUM CHLORIDE 0.9 % INJECTION SOLUTION: INTRAMUSCULAR | Qty: 10

## 2018-03-20 MED FILL — BOTOX 100 UNIT INJECTION: 100 100 unit | INTRAMUSCULAR | Qty: 2

## 2018-03-20 NOTE — Unmapped (Signed)
See attached audiogram.

## 2018-03-20 NOTE — Unmapped (Addendum)
Faxed appeal today, pending review    Received letter from [place] stating denial has been UPHELD

## 2018-03-20 NOTE — Unmapped (Signed)
MRN:  [id number]  Date: 03/20/2018   Evelynne Spiers is a 58 y.o. female who comes in for scheduled Botox injection.      BOTOX PREEMPT PROTOCOL     Total headache days per month: 8-12/mo.  Total days headache free per month: 18  Severity of headaches: moderate  Botox effective: Yes - She says it is so much better and headaches are just coming back in the past 1-1/2 weeks.   She has also been doing PT for her neck and they also gave her isotonic exercises for her headaches.  She was having 30/mo headaches.  This is a reduction of 18-22/mo!    The risks, benefits and anticipated outcomes of the procedure, the risks and benefits of the alternatives to the procedure, and the roles and tasks of the personnel to be involved, were discussed with the patient.     The side effects of onabotulinum toxin used for the treatment of chronic migraine have been described to the patient in detail.  These include: ptosis, neck pain, neck weakness, anaphylaxis, cellulitis, systemic spread (death, respiratory failure, dysarthria, dysphagia, diplopia), blood born illnesses (HIV, hepatitis, mad cow disease).   The patient is aware that any side effects related to muscle paralysis could last for 3 months.     The patient has given written informed consent to the procedure and agrees to proceed.Yes     Treatment # 4   Dilution: 5 units/0.1 ml (200 U of botox was mixed with 4 ccs of bacteriostatic saline.)  Indication: Chronic Migraine        Injection Sites:  0.1 ccs of this solution was injected into the following:  Muscle    Fixed Site/Fixed Dose  L      R   Corrugator??????????????????????????????????????10 Units divided in 2 sites??????????????????????????????????????????  ??  Procerus ??????????????????????????????????????????5 Units in 1 site ????????????????????????????????????????????????????????????????????????  ??  Frontalis????????????????????????????????????????????20 Units divided in 4 sites????????????????????????????????????????????  ??  Temporalis ??????????????????????????????????40 Units divided in 8 sites ????????????????????????????????????????  ??  Occipitalis ??????????????????????????????????????30 Units divided  in 6 sites ????????????????????????????????????????  ??  Cervical PSPs??????????????????????????20 Units divided in 4 sites??????????????????????????????????????????  ??  Trapezius????????????????????????????????????????30 Units divided in 6 sites???????????????????????????????????? 22.5 units extra left trapezius in 5 divided doses  ????????????????????????????????????????????????????????????????????????????????????????????????????????????????????????????????????????????????????????????????????????????????????????????     ??22.5 units extra right trapezius in 5 divided doses  ????????????????????????????????????????????????????????????????????????????????????????????????????????????????????????????????????????????????????????????????????????????????????????????   ????Did not do this (Low injections down mid spine where muscle is tight.)    ??  Total Units used:200   Total Units wasted:0   Patient did tolerate procedure.   ??        Change in Treatment Plan?no    RTC: 3 Months, For Botox and PRN    Stefan Church, CNP

## 2018-03-20 NOTE — Unmapped (Signed)
Chief Complaint   Patient presents with   ??? 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  She also has longstanding acquired hypothyroidism, with Hashimoto's thyroiditis, with a prior TSH of 6.1 having subsequently increased her levothyroxine and started vitamin D.  She does have a complicated medical history, including severe allergy and mast cell activation.  She also has a strong family history of thyroid cancer, as well as Hashimoto's thyroiditis.  She has noted weight gain over the past several months.  She reports underlying autoimmune disease requiring frequent antihistamine and steroid therapy..     Cancer Staging  Cancer Staging  No matching staging information was found for the patient.    Histories  She has a past medical history of Anaphylaxis, Anemia, Anxiety, Asthma, Atrial fibrillation (CMS Dx), Bronchitis, Heart disease, Neurological disease, OSA (obstructive sleep apnea), Osteoporosis, Psychogenic nonepileptic seizure, Seizures (CMS Dx), Spontaneous pneumothorax, Thyroid disease, Ulcer, and WPW (Wolff-Parkinson-White syndrome).    She has a past surgical history that includes Tibia fracture surgery; Tonsillectomy; Liver biopsy; Cardiac electrophysiology mapping and ablation; Facial cosmetic surgery; Bone marrow biopsy; removal hardware leg (Left, 08/17/2016); dental extraction (Right, 04/25/2017); Cholecystectomy (2015); Hysterectomy (2009); and insertion catheter vascular access (N/A, 09/06/2017).    Her family history includes Aneurysm in her mother; Kidney failure in her mother.    She reports that she has never smoked. She has never used smokeless tobacco. She reports that she does not drink alcohol or use  drugs.    Allergies  Acetaminophen; Adhesive tape-silicones; Alcohol; Amitriptyline; Aspirin; Benzoic acid; Blue dye; Epinephrine; Flecainide; Gabapentin; Iodinated contrast media; Isopropyl alcohol; Latex; Levofloxacin; Lidocaine; Nsaids (non-steroidal anti-inflammatory drug); Onion; Other; Paraben; Perfume; Pregabalin; Preservative; Quetiapine; Red dye; Sulfa (sulfonamide antibiotics); Sulfur; Trazodone; Verapamil; Yellow dye; Green dye; Onion extract; Doxepin; Duloxetine; Epi e-z pen; Hydroxyzine; Montelukast; Statins-hmg-coa reductase inhibitors; and Verapamil (bulk)    Medications  Outpatient Encounter Medications as of 03/20/2018   Medication Sig Dispense Refill   ??? blood-glucose meter Misc Use meter to test blood glucose twice daily 1 each 0   ??? busPIRone (BUSPAR) 10 MG tablet Take 2 tablets (20 mg total) by mouth 3 times a day. 180 tablet 2   ??? cetirizine (ZYRTEC) 10 mg Cap      ??? cromolyn (GASTROCROM) 100 mg/5 mL solution TAKE BY MOUTH 4 TIMES DAILY: BEFORE MEALS & AT BEDTIME 480 mL 0   ??? cyanocobalamin, vitamin B-12, (VITAMIN B-12 INJ) Inject 0.5 mLs as directed.     ??? diphenhydrAMINE (BENADRYL) 25 mg capsule Take 75-150 mg by mouth daily.     ??? doxepin (SINEQUAN) 10 MG capsule TAKE ONE (1) CAPSULE BY MOUTH AT BEDTIME. 30 capsule 11   ??? famotidine (PEPCID) 40 MG tablet Take 40 mg by mouth 2 times a day.     ??? fexofenadine (ALLEGRA) 180 MG tablet Take 1 tablet (180 mg total) by mouth daily. 30 tablet 3   ??? furosemide (LASIX) 20 MG tablet Take 1 tablet (20 mg total) by mouth daily. 90 tablet 3   ??? hydrOXYzine HCl (ATARAX) 25 MG tablet Take 1 tablet (25 mg total) by mouth every 8 hours as needed for Itching.  90 tablet 2   ??? lancets Misc Use on lancet twice daily to check blood glucose 200 each 1   ??? levalbuterol (XOPENEX) 1.25 mg/3 mL nebulizer solution Inhale 3 mL (1.25 mg total) by nebulization every 6 hours as needed for Wheezing. 72 mL 0   ??? levothyroxine (SYNTHROID, LEVOTHROID) 137 MCG tablet Take 1  tablet (137 mcg total) by mouth daily. 30 tablet 11   ??? metFORMIN (GLUCOPHAGE) 500 MG tablet TAKE ONE TABLET BY MOUTH TWICE DAILY WITH MEALS 60 tablet 11   ??? montelukast (SINGULAIR) 10 mg tablet TAKE ONE TABLET BY MOUTH AT BEDTIME. 30 tablet 11   ??? nebulizer and compressor Devi Use 1 ampule as directed every 4 hours. Indications: Please dispense 1 nebulizer unit 1 each 0   ??? onabotulinumtoxinA (BOTOX COSMETIC) 50 unit SolR Inject into the muscle.     ??? predniSONE (DELTASONE) 10 MG tablet Take 1 tablet (10 mg total) by mouth daily. 60 tablet 1   ??? predniSONE (DELTASONE) 20 MG tablet TAKE ONE TABLET BY MOUTH DAILY. 30 tablet 0   ??? ranitidine (ZANTAC) 300 MG tablet Take 1 tablet (300 mg total) by mouth 3 times a day. 90 tablet 11   ??? Ringer's solution,lactated (LACTATED RINGERS) infusion Intravenous 1,000 mLs once.     ??? triamcinolone (KENALOG) 0.1 % ointment APPLY 2 TIMES DAILY 80 g 11   ??? TRUE METRIX GLUCOSE TEST STRIP Strp USE TO TEST BLOOD SUGAR 2 TIMES DAILY 50 strip 11   ??? UNABLE TO FIND COMPOUNDED METHYLCOBALAMIN 1000 MCG EVERY OTHER DAY BY INJECTION/ TRISTATE 1000 mL 11   ??? VITAMIN D3-VITAMIN K2, MK4, ORAL Take by mouth.       Facility-Administered Encounter Medications as of 03/20/2018   Medication Dose Route Frequency Provider Last Rate Last Dose   ??? botulinum toxin A (BOTOX) 100 unit injection            ??? sodium chloride 0.9%                 ??The following portions of the patient's history were reviewed and updated as appropriate: allergies, current medications, past family history, past medical history, past social history, past surgical history, review of systems??and problem list.  ??  Review of Systems  Comprehensive review of systems was reviewed and??scanned, with pertinent information included in HPI.      Vitals  Blood pressure (!) 143/93, pulse 95, height 5' 7 (1.702 m), weight 214 lb (97.1 kg), SpO2 97 %.  Physical Exam  Constitutional:      General: Well developed and nourished, appears stated age,  no distress      Communication:  Normal voice  Head and Face:      Inspection: No masses      Palpation: Non-tender      Salivary glands:  Normal, no mass, non-tender      Facial strength: Normal, House grade I/VI bilaterally  Eyes:      Intact ocular motility with midline gaze  Ears:      Normal external auditory canals and tympanic membrane mobility bilaterally      Normal hearing to soft voice/finger rub      External ear/auricle without lesion bilaterally  Nose:      External nose without lesion      Nasal septum midline, intact inferior turbinate bilaterally  Oral cavity:      No lip or gum lesion  Oropharynx:      No lesion of oral mucosa,  palate, or tonsil fossa      No lesion of pharyngeal walls/hypopharynx  Nasopharynx:      No mucosal lesion  Larynx:      Normal vocal cord motion, no lesions  Neck:       No mass  Thyroid:      No mass  Respiratory:      No retractions or accessory breathing  Cardiovascular:      Extremities warm and non swollen  Lymphatic:      No cervical lymphadenopathy  Neurologic:      Cranial nerves intact, DHP+    Lab Review:   Lab Results   Component Value Date    TSH 3.82 11/10/2017    T3TOTAL 109.3 11/10/2017    FREET4 1.18 11/10/2017    THYROIDAB 747.0 (H) 06/09/2017      Lab Results   Component Value Date    PTH 79.0 11/10/2017    CALCIUM 9.5 11/10/2017    PHOS 4.1 11/10/2017     Lab Results   Component Value Date    VITD25H 37.7 11/10/2017      Lab Results   Component Value Date    WBC 12.6 (H) 05/31/2017    HGB 14.8 05/31/2017    HCT 45.2 (H) 05/31/2017    MCV 84.9 05/31/2017    PLT 317 05/31/2017    CREATININE 0.77 11/10/2017    BUN 10 11/10/2017    NA 142 11/10/2017    K 4.0 11/10/2017    CL 104 11/10/2017    CO2 22 11/10/2017    ALT 14 10/28/2016    AST 16 10/28/2016    GGT 53 06/09/2017    ALKPHOS 110 10/28/2016    BILITOT 0.3 10/28/2016     1. Weight gain. I recommended attempts at weight loss, with a BMI target ideal 25, more realistically less than 30.     2. Acquired  hypothyroidism.  Recommend titration of her levothyroxine to keep her TSH in the normal range, ideally between 1 and 3.     3,4. Strong family history of thyroid cancer and Hashimoto's thyroiditis.  Ultrasound revealed a heterogenous thyroid, without dominant or suspicious nodule.   After a discussion of the risks and benefits, including alternative treatment options, such as ultrasound guided fine needle biopsy or thyroid surgery, the patient elected to proceed with observation and sonographic surveillance.     5,6.. Hyperparathyroidism, likely secondary to vitamin D deficiency.  I recommended vitamin D supplementation with a 25 hydroxy vitamin D target greater than 30.     7, 8. Tinnitus with mild HF SNHL, masking, avoidance caffeine/ASA, follow-up audio 17yr    9,10.  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

## 2018-03-21 ENCOUNTER — Ambulatory Visit: Admit: 2018-03-21 | Payer: PRIVATE HEALTH INSURANCE | Attending: Pain Medicine

## 2018-03-21 DIAGNOSIS — M5030 Other cervical disc degeneration, unspecified cervical region: Secondary | ICD-10-CM

## 2018-03-21 NOTE — Unmapped (Signed)
Chief Complaint   Patient presents with   ??? Back Pain     lower    ??? Neck Pain      History of Present Illness  Patient is a 58 yo F here for neck and back pain.    Since the last clinic visit on 1/7, her pain has improved slightly. She uses the HEP from PT to help improve her pain control. 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 She is s/p Botox injection 03/20/2018 from Neurology. She is s/p acupuncture on 2/27. Due to insurance issues she was only able to do 2 weeks of PT/5 sessions, but it will be started back up this Wednesday for 2x a week for 2 more weeks.    Neck pain is 4/10 on VAS, at maximum is 9/10. Pain is numbness, sharp, shooting and tingling in nature only to left little finger. Pain is referred down left arm. The pain is intermittent. The pain is improved by heat, stretching, meditation, acupuncture, HEP. 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 Her last flareup was in January.     Back pain is 4/10 on VAS, at maximum is 9/10. Pain is spasm, aching, dull, sharp and vague in nature. Pain is referred into bilateral buttocks and occasionally into left hip. The pain is intermittent. The pain is improved by stretching, heat, meditation. The pain is worse with standing for prolonged position, positional while sitting, flareups with mast cell disease.    She would like to see if the PT helps with her neck pain and back pain prior to intervention.     Past Medications:  HYDROMORPHONE - Effective - only post procedurally with benadryl   Had anaphylactic reactions to NSAIDs, Tylenol, ASA, and neuropathic agents      Past Modalities:  TENS:                                                              Yes; does not help  Physical Therapy within last 6 months:          Yes  Chiropractor:                                                   No  Massage Therapy:                                          Yes  Psychotherapy:                                                Yes; Psychiatry and Psychology  She utilizes meditation, acupuncture, stretching; Sees integrative Medicine     Patient Complains of:  Uro-fecal Incontinence:  Yes; from Mast cell activation  Weight Gain/Loss:                                          No  Fever/Chills:                                                    No  Weakness:                                                      No      The following portions of the patient's history were reviewed and updated as appropriate: allergies, current medications, past family history, past medical history, past social history, past surgical history and problem list.    Histories  She has a past medical history of Anaphylaxis, Anemia, Anxiety, Asthma, Atrial fibrillation (CMS Dx), Bronchitis, Heart disease, Neurological disease, OSA (obstructive sleep apnea), Osteoporosis, Psychogenic nonepileptic seizure, Seizures (CMS Dx), Spontaneous pneumothorax, Thyroid disease, Ulcer, and WPW (Wolff-Parkinson-White syndrome).    She has a past surgical history that includes Tibia fracture surgery; Tonsillectomy; Liver biopsy; Cardiac electrophysiology mapping and ablation; Facial cosmetic surgery; Bone marrow biopsy; removal hardware leg (Left, 08/17/2016); dental extraction (Right, 04/25/2017); Cholecystectomy (2015); Hysterectomy (2009); and insertion catheter vascular access (N/A, 09/06/2017).    Her family history includes Aneurysm in her mother; Kidney failure in her mother.    She reports that she has never smoked. She has never used smokeless tobacco. She reports that she does not drink alcohol or use drugs.    Allergies  Acetaminophen; Adhesive tape-silicones; Alcohol; Amitriptyline; Aspirin; Benzoic acid; Blue dye; Epinephrine; Flecainide; Gabapentin; Iodinated contrast media; Isopropyl alcohol; Latex; Levofloxacin; Lidocaine; Nsaids (non-steroidal anti-inflammatory drug); Onion; Other; Paraben; Perfume; Pregabalin; Preservative; Quetiapine;  Red dye; Sulfa (sulfonamide antibiotics); Sulfur; Trazodone; Verapamil; Yellow dye; Green dye; Onion extract; Doxepin; Duloxetine; Epi e-z pen; Hydroxyzine; Montelukast; Statins-hmg-coa reductase inhibitors; and Verapamil (bulk)    Medications  Outpatient Encounter Medications as of 03/21/2018   Medication Sig Dispense Refill   ??? blood-glucose meter Misc Use meter to test blood glucose twice daily 1 each 0   ??? busPIRone (BUSPAR) 10 MG tablet Take 2 tablets (20 mg total) by mouth 3 times a day. 180 tablet 2   ??? cetirizine (ZYRTEC) 10 mg Cap      ??? cromolyn (GASTROCROM) 100 mg/5 mL solution TAKE BY MOUTH 4 TIMES DAILY: BEFORE MEALS & AT BEDTIME 480 mL 0   ??? cyanocobalamin, vitamin B-12, (VITAMIN B-12 INJ) Inject 0.5 mLs as directed.     ??? diphenhydrAMINE (BENADRYL) 25 mg capsule Take 75-150 mg by mouth daily.     ??? doxepin (SINEQUAN) 10 MG capsule TAKE ONE (1) CAPSULE BY MOUTH AT BEDTIME. 30 capsule 11   ??? famotidine (PEPCID) 40 MG tablet Take 40 mg by mouth 2 times a day.     ??? fexofenadine (ALLEGRA) 180 MG tablet Take 1 tablet (180 mg total) by mouth daily. 30 tablet 3   ??? furosemide (LASIX) 20 MG tablet Take 1 tablet (20 mg total) by mouth daily. 90 tablet 3   ???  hydrOXYzine HCl (ATARAX) 25 MG tablet Take 1 tablet (25 mg total) by mouth every 8 hours as needed for Itching. 90 tablet 2   ??? lancets Misc Use on lancet twice daily to check blood glucose 200 each 1   ??? levalbuterol (XOPENEX) 1.25 mg/3 mL nebulizer solution Inhale 3 mL (1.25 mg total) by nebulization every 6 hours as needed for Wheezing. 72 mL 0   ??? levothyroxine (SYNTHROID, LEVOTHROID) 137 MCG tablet Take 1 tablet (137 mcg total) by mouth daily. 30 tablet 11   ??? metFORMIN (GLUCOPHAGE) 500 MG tablet TAKE ONE TABLET BY MOUTH TWICE DAILY WITH MEALS 60 tablet 11   ??? montelukast (SINGULAIR) 10 mg tablet TAKE ONE TABLET BY MOUTH AT BEDTIME. 30 tablet 11   ??? nebulizer and compressor Devi Use 1 ampule as directed every 4 hours. Indications: Please dispense 1  nebulizer unit 1 each 0   ??? onabotulinumtoxinA (BOTOX COSMETIC) 50 unit SolR Inject into the muscle.     ??? predniSONE (DELTASONE) 10 MG tablet Take 1 tablet (10 mg total) by mouth daily. 60 tablet 1   ??? predniSONE (DELTASONE) 20 MG tablet TAKE ONE TABLET BY MOUTH DAILY. 30 tablet 0   ??? ranitidine (ZANTAC) 300 MG tablet Take 1 tablet (300 mg total) by mouth 3 times a day. 90 tablet 11   ??? Ringer's solution,lactated (LACTATED RINGERS) infusion Intravenous 1,000 mLs once.     ??? triamcinolone (KENALOG) 0.1 % ointment APPLY 2 TIMES DAILY 80 g 11   ??? TRUE METRIX GLUCOSE TEST STRIP Strp USE TO TEST BLOOD SUGAR 2 TIMES DAILY 50 strip 11   ??? UNABLE TO FIND COMPOUNDED METHYLCOBALAMIN 1000 MCG EVERY OTHER DAY BY INJECTION/ TRISTATE 1000 mL 11   ??? VITAMIN D3-VITAMIN K2, MK4, ORAL Take by mouth.       Facility-Administered Encounter Medications as of 03/21/2018   Medication Dose Route Frequency Provider Last Rate Last Dose   ??? [COMPLETED] botulinum toxin A (BOTOX) injection 200 Units  200 Units Intramuscular Once Stefan Church, CNP   200 Units at 03/20/18 1030   ??? [DISCONTINUED] botulinum toxin A (BOTOX) 100 unit injection            ??? [DISCONTINUED] sodium chloride 0.9%                 Review of Systems   Musculoskeletal: Positive for back pain, neck pain and neck stiffness.   Neurological: Positive for light-headedness and numbness.   Psychiatric/Behavioral: Positive for sleep disturbance.   All other systems reviewed and are negative.      Vitals  Blood pressure 136/71, pulse 80, resp. rate 16, height 5' 7 (1.702 m), weight 214 lb (97.1 kg), SpO2 91 %.    Physical Exam  Vitals signs reviewed.   HENT:      Head: Normocephalic and atraumatic.      Nose: Nose normal.   Eyes:      Extraocular Movements: Extraocular movements intact.      Pupils: Pupils are equal, round, and reactive to light.   Neck:      Musculoskeletal: Normal range of motion.   Cardiovascular:      Rate and Rhythm: Normal rate.   Pulmonary:      Effort:  Pulmonary effort is normal.   Abdominal:      General: Abdomen is flat.      Palpations: Abdomen is soft.   Musculoskeletal:      Cervical back: She exhibits decreased range of motion, tenderness,  pain and spasm.        Back:    Neurological:      Mental Status: She is alert and oriented to person, place, and time.   Psychiatric:         Mood and Affect: Mood normal.         Behavior: Behavior normal.         Thought Content: Thought content normal.         Judgement: Judgment normal.        Neurologic Exam     Mental Status   Oriented to person, place, and time.     Cranial Nerves     CN III, IV, VI   Pupils are equal, round, and reactive to light.     Ortho Exam    Activities of Daily Living:  1. Activity Level - patient indicates as improved and performs independently.  2. Sleep Patterns - patient indicates as improved and performs independently.                                 - indicates an average of 7-8 hours of sleep per night.  3. Motivation - patient indicates as the same and performs independently.  4. Functional Level - patient indicates as the same and performs independently.           (a) Bathing - patient indicates as the same and performs independently.           (b) Dressing - patient indicates as the same and performs independently.           (c) Transferring from bed/chair - patient indicates as the same and performs independently.           (d) Walking - patient indicates as the same and performs independently.           (e) Eating - patient indicates as th esame and performs independently.           (f) Toilet Use - patient indicates as the same and performs independently.           (g) Personal Hygiene - patient indicates as the same and performs independently.  Notes:      Review of Lab Results  Lab Results   Component Value Date    WBC 12.6 (H) 05/31/2017    RBC 5.32 (H) 05/31/2017    HGB 14.8 05/31/2017    HCT 45.2 (H) 05/31/2017    MCV 84.9 05/31/2017    MCH 27.9 05/31/2017    MCHC 32.9  05/31/2017    RDW 14.6 05/31/2017    PLT 317 05/31/2017    MPV 8.3 05/31/2017    MG 2.0 06/09/2017    BUN 10 11/10/2017       Pain Flowsheet (Urine Drug/OARRS-eKASPER/Narcotic) 09/26/2016 03/21/2018   OARRS/eKASPER Status Reviewed Reviewed   OARRS/eKASPER Consistent with Prescriber Expectation Y Y       No results found for this or any previous visit.   I have completed the required OARRS documentation for this patient on 03/21/2018.  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 There is no evidence of pneumothorax.   The heart size is normal. The lungs are clear.   IMPRESSION:   Negative portable chest with no evidence of pneumothorax or other complication after port catheter placement.       Assessment:  1. Neck Pain  2. Arm Pain  3. Low back pain without sciatica  4. Chronic Pain Syndrome  5. Myofascial pain     Plan:  1. UDS deferred.  2. Discussed with the patient that pain medicine physicians use a variety of evidence based treatment modalities in order to treat various painful conditions. This may include referral to physical therapy, the use of interventional procedures, referral for psychological assessment and treatment, and the use of both opioid and non-opioid medications as appropriate for the treatment of pain. Informed the patient that opioids have not been proven to be effective for the long term treatment chronic pain conditions, hence our conservative use of these medications. Discussed with the patient that our goal is to use a variety of treatment options that may be indicated for their condition in order to manage their pain and improve their overall functionality. The patient expressed understanding.    3. We discussed continuing a course of formal physical therapy.  Physical therapy can help strengthen and stretch the muscles around the joints. Continue to be as active as possible. Start physical therapy as it will help generalized pain and follow up with HEP. She will continue her remaining sessions and consider intervention if the PT did not help her. In the meantime, encourage patient to continue the home exercise program.  4. She would benefit from CESI C7-T1, but she would like to address it after completion of formal PT.   5. She would also potentially benefit from bilateral L4-Sa MBB, will address neck pain first as this is more functionally altering.       RTC in 3 months or earlier for procedure.    Corena Herter, MD  [facility] Pain Fellow        ATTENDING NOTE:    I have supervised the fellow in this assessment/procedure, reviewed their note and discussed care with them.  I personally saw the patient and agree with their history, physical examination, assessment and plan and or procedure note as noted above.     Continue physical therapy. Return to clinic as needed    Aldean Baker MD  Pain Medicine Faculty   Assistant Professor of Anesthesiology  Department of Anesthesiology   [facility]

## 2018-03-27 ENCOUNTER — Ambulatory Visit: Admit: 2018-03-27 | Discharge: 2018-03-27 | Payer: PRIVATE HEALTH INSURANCE | Attending: Audiologist-Hearing Aid Fitter

## 2018-03-27 DIAGNOSIS — R42 Dizziness and giddiness: Secondary | ICD-10-CM

## 2018-03-27 MED ORDER — furosemide (LASIX) 20 MG tablet
20 | ORAL_TABLET | Freq: Every day | ORAL | 3 refills | Status: AC
Start: 2018-03-27 — End: 2019-03-20

## 2018-03-27 NOTE — Unmapped (Signed)
Here today to determine if positional vertigo is BPPV  Negative Vertebral Artery Screenings but both side for dix hallpikes produced fluctuating wooziness.  No nystagmus was observed   Discussed BPPV vs Neuronitis  Will see MLP on the 19th to determine next step.

## 2018-03-28 MED ORDER — cromolyn (GASTROCROM) 100 mg/5 mL solution
100 | ORAL | 0 refills | 24.00000 days | Status: AC
Start: 2018-03-28 — End: 2018-05-23

## 2018-04-04 NOTE — Unmapped (Signed)
Pt started with sinus infection some time last week and by Saturday it was full blown.  Having reaction to cleaning supplies and home nurse said it's anaphylactic shock.  Home nurse administered benadryl.    Pt would like to continue IV fluids for five days straight.      Please phone pt at [phone] (H).

## 2018-04-04 NOTE — Unmapped (Signed)
Called and spoke with patient. She states she had a reaction to cleaning supplies. She states she usually gets IV hydration on two days on and 5 days off. This week her nurse hooked her up on Tuesday so she will be disconnected tomorrow but requesting  a couple more days'. Patient asking if she can get one liter each day this week until Friday. Patient states she also started on Prednisone 10mg  po daily by Dr. Nicholes Stairs. Her home health nurse gave her Benadryl IV for wheezing and voice was raspy  Today at 2pm. She is asking if DR. Latif would precribe more IV fluids this week and IV steroids for the anaphalaxis.  Patient states I'm having inflammation.   Her home health company is [employer] [phone]   FAX number is : [phone].  Will forward to NP Ailene Ravel  for additional advice/orders/managment.  Verbalized understanding and is agreeable with plan.

## 2018-04-04 NOTE — Unmapped (Signed)
Contacted patient to reschedule her appointment for 04/06/2018.  Appointment rescheduled to 06/01/2018 at 1:30pm with Dr. Anda Latina.  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

## 2018-04-05 NOTE — Unmapped (Signed)
Called patient and she states she is feeling a little better.  She states she understands that Dr. Lourdes Sledge will not be managing the IV medications besides the IV fluids. Patients states I just feel like I need an extra two days of fluids to get me over this hump. She is requesting her IV fluids be continued this week (this week only making it a one time situation) until Friday.  Will forward to NP Ailene Ravel  for additional advice/orders/managment.  Verbalized understanding and is agreeable with plan.

## 2018-04-06 ENCOUNTER — Ambulatory Visit: Payer: PRIVATE HEALTH INSURANCE | Attending: Otology & Neurotology

## 2018-04-06 MED ORDER — AMOXicillin-clavulanate (AUGMENTIN) 500-125 mg per tablet
500-125 | ORAL_TABLET | Freq: Three times a day (TID) | ORAL | 0 refills | Status: AC
Start: 2018-04-06 — End: 2018-10-19

## 2018-04-06 NOTE — Unmapped (Signed)
Called and left a message for patient to speak to her about response of Dr. Nicholes Stairs in regards to the IV fluids. Patient had previously told me that Dr. Nicholes Stairs was OK with her getting IV fluids.

## 2018-04-06 NOTE — Unmapped (Signed)
Called the patient back and had a lengthy conversation with her. She reports having exposure to bleach recently which exacerbated her allergy symptoms. Also has sinus pressure and green drainage for the past 2 weeks. I will send a prescription for Amox-Clav 500mg  TID for 10 days and will check in with her after she completes this course.    I discussed with the patient that allergy clinic is not managing her IV fluid home therapy as this is out of scope of our practice. She expressed understanding.    Matilde Sprang, D.O.  Allergy and Immunology Fellow, PGY-5  Pager: [phone]

## 2018-04-18 ENCOUNTER — Ambulatory Visit: Payer: PRIVATE HEALTH INSURANCE

## 2018-04-27 ENCOUNTER — Ambulatory Visit: Admit: 2018-04-27 | Discharge: 2018-05-31 | Payer: PRIVATE HEALTH INSURANCE | Attending: Psychiatry

## 2018-04-27 DIAGNOSIS — F419 Anxiety disorder, unspecified: Secondary | ICD-10-CM

## 2018-04-27 MED ORDER — busPIRone (BUSPAR) 10 MG tablet
10 | ORAL_TABLET | Freq: Three times a day (TID) | ORAL | 2 refills | Status: AC
Start: 2018-04-27 — End: 2018-07-06

## 2018-04-27 MED ORDER — hydrOXYzine HCL (ATARAX) 25 MG tablet
25 | ORAL_TABLET | Freq: Three times a day (TID) | ORAL | 2 refills | Status: AC | PRN
Start: 2018-04-27 — End: 2018-07-06

## 2018-04-27 NOTE — Unmapped (Signed)
Visit Start Time: 1:15 PM   Visit Quit Time: 1:40 PM      Context:??somatic symptoms  Location: Altered mental status of anxiety  Duration: chronic  Severity:??mild  Associated Symptoms: anxiety  Modifying Factors:improving    Service provided via telemedicine (video + audio) today due to need for limiting face to face contact as means of managing the spread of Covid19.        History of Present Illness:   Emily Bonilla is a 58 y.o. female with PMH unspecified anxiety disorder, atrial fibrillation, WPW, idiopathic mast cell activation, anemia, asthma, hypothyroidism, GERD, intermittent chronic HA, PNES, presents for follow-up.     HPI     Patient has been doing well. Laughs about covid, states, This is my life, now other people are living it. Has been staying indoors, observing social distancing. Does feel anxiety has worsened a bit, having acute worsening about 8-10x a week. Describes as not really a panic attack, just get more anxious. Does feel that it is situational more than worsening of anxiety disorder. Went over some behavioral techniques to deal with it. Patient was having issues w/ sleep due to this but has improved more recently. Working w/ functional medicine doctor. Has not seen PCP, plannin gon making an appointment after all of this is over. Mother is doing well, decided not to do surgery. Keeping in touch with siblings via video chat. Doing medication w/ app. Denies SI/HI/AVH.         Patient History:     Psychiatric History:     Prior Inpatient Psychiatric Hospitalization(s): denies  Prior Psychotropic Medications: reports taking prior psychotropic medications.  Current Psychotropic Medications: yes - see below current psychotropic medications.  SA: denies    Past Medical History:   Diagnosis Date   ??? Anaphylaxis    ??? Anemia    ??? Anxiety    ??? Asthma    ??? Atrial fibrillation (CMS Dx)    ??? Bronchitis    ??? Heart disease    ??? Neurological disease    ??? OSA (obstructive sleep apnea)     states  resolved   ??? Osteoporosis    ??? Psychogenic nonepileptic seizure    ??? Seizures (CMS Dx)    ??? Spontaneous pneumothorax     states pneumomediatsium/pneumopericardium, self resolved   ??? Thyroid disease    ??? Ulcer    ??? WPW (Wolff-Parkinson-White syndrome)        Past Surgical History:   Procedure Laterality Date   ??? BONE MARROW BIOPSY      benign   ??? CARDIAC ELECTROPHYSIOLOGY MAPPING AND ABLATION      attempted x 2, states not successful either time   ??? CHOLECYSTECTOMY  2015   ??? DENTAL EXTRACTION Right 04/25/2017    Procedure: EXTRACTION TOOTH # 31, BRIDGE RESECTIONING;  Surgeon: Gladys Damme, DMD;  Location: [facility] OR;  Service: Oral Surgery;  Laterality: Right;   ??? FACIAL COSMETIC SURGERY     ??? HYSTERECTOMY  2009   ??? INSERTION CATHETER VASCULAR ACCESS N/A 09/06/2017    Procedure: INSERTION POWER PORT A CATH;  Surgeon: Penni Bombard, MD;  Location: [facility] OR;  Service: General;  Laterality: N/A;   ??? LIVER BIOPSY      benign hepatic adednoma per patient   ??? REMOVAL HARDWARE LEG Left 08/17/2016    Procedure: REMOVAL OF HARDWARE LEFT TIBIA;  Surgeon: Andee Lineman, MD;  Location: [facility] OR;  Service: Orthopedics;  Laterality: Left;   ??? TIBIA FRACTURE SURGERY     ???  TONSILLECTOMY      + adenoids       Social History     Socioeconomic History   ??? Marital status: Divorced     Spouse name: Not on file   ??? Number of children: Not on file   ??? Years of education: Not on file   ??? Highest education level: Not on file   Occupational History   ??? Not on file   Social Needs   ??? Financial resource strain: Not on file   ??? Food insecurity:     Worry: Not on file     Inability: Not on file   ??? Transportation needs:     Medical: Not on file     Non-medical: Not on file   Tobacco Use   ??? Smoking status: Never Smoker   ??? Smokeless tobacco: Never Used   ??? Tobacco comment: Denies (11/15/2016)   Substance and Sexual Activity   ??? Alcohol use: No     Comment: Denies (11/15/2016)   ??? Drug use: No     Comment: Denies (11/15/2016)   ??? Sexual activity: Not  Currently   Lifestyle   ??? Physical activity:     Days per week: Not on file     Minutes per session: Not on file   ??? Stress: Not on file   Relationships   ??? Social connections:     Talks on phone: Not on file     Gets together: Not on file     Attends religious service: Not on file     Active member of club or organization: Not on file     Attends meetings of clubs or organizations: Not on file     Relationship status: Not on file   ??? Intimate partner violence:     Fear of current or ex partner: Not on file     Emotionally abused: Not on file     Physically abused: Not on file     Forced sexual activity: Not on file   Other Topics Concern   ??? Caffeine Use Yes   ??? Occupational Exposure Not Asked   ??? Exercise No   ??? Seat Belt Yes   Social History Narrative   ??? Not on file       Family History   Problem Relation Age of Onset   ??? Kidney failure Mother    ??? Aneurysm Mother        Review of Systems    Denies CP, SOB, nausea, vomiting    Allergies:   Acetaminophen; Adhesive tape-silicones; Alcohol; Amitriptyline; Aspirin; Benzoic acid; Blue dye; Epinephrine; Flecainide; Gabapentin; Iodinated contrast media; Isopropyl alcohol; Latex; Levofloxacin; Lidocaine; Nsaids (non-steroidal anti-inflammatory drug); Onion; Other; Paraben; Perfume; Pregabalin; Preservative; Quetiapine; Red dye; Sulfa (sulfonamide antibiotics); Sulfur; Trazodone; Verapamil; Yellow dye; Green dye; Onion extract; Doxepin; Duloxetine; Epi e-z pen; Hydroxyzine; Montelukast; Statins-hmg-coa reductase inhibitors; and Verapamil (bulk)    Medications:     Outpatient Encounter Medications as of 04/27/2018   Medication Sig Dispense Refill   ??? AMOXicillin-clavulanate (AUGMENTIN) 500-125 mg per tablet Take 1 tablet by mouth 3 times a day. 30 tablet 0   ??? blood-glucose meter Misc Use meter to test blood glucose twice daily 1 each 0   ??? [EXPIRED] busPIRone (BUSPAR) 10 MG tablet Take 2 tablets (20 mg total) by mouth 3 times a day. 180 tablet 2   ??? cetirizine (ZYRTEC) 10  mg Cap      ??? cromolyn (GASTROCROM) 100 mg/5 mL  solution TAKE BY MOUTH 4 TIMES DAILY: BEFORE MEALS & AT BEDTIME 480 mL 0   ??? cyanocobalamin, vitamin B-12, (VITAMIN B-12 INJ) Inject 0.5 mLs as directed.     ??? diphenhydrAMINE (BENADRYL) 25 mg capsule Take 75-150 mg by mouth daily.     ??? doxepin (SINEQUAN) 10 MG capsule TAKE ONE (1) CAPSULE BY MOUTH AT BEDTIME. 30 capsule 11   ??? famotidine (PEPCID) 40 MG tablet Take 40 mg by mouth 2 times a day.     ??? fexofenadine (ALLEGRA) 180 MG tablet Take 1 tablet (180 mg total) by mouth daily. 30 tablet 3   ??? furosemide (LASIX) 20 MG tablet Take 1 tablet (20 mg total) by mouth daily. 90 tablet 3   ??? [EXPIRED] hydrOXYzine HCl (ATARAX) 25 MG tablet Take 1 tablet (25 mg total) by mouth every 8 hours as needed for Itching. 90 tablet 2   ??? lancets Misc Use on lancet twice daily to check blood glucose 200 each 1   ??? levalbuterol (XOPENEX) 1.25 mg/3 mL nebulizer solution Inhale 3 mL (1.25 mg total) by nebulization every 6 hours as needed for Wheezing. 72 mL 0   ??? levothyroxine (SYNTHROID, LEVOTHROID) 137 MCG tablet Take 1 tablet (137 mcg total) by mouth daily. 30 tablet 11   ??? metFORMIN (GLUCOPHAGE) 500 MG tablet TAKE ONE TABLET BY MOUTH TWICE DAILY WITH MEALS 60 tablet 11   ??? montelukast (SINGULAIR) 10 mg tablet TAKE ONE TABLET BY MOUTH AT BEDTIME. 30 tablet 11   ??? nebulizer and compressor Devi Use 1 ampule as directed every 4 hours. Indications: Please dispense 1 nebulizer unit 1 each 0   ??? onabotulinumtoxinA (BOTOX COSMETIC) 50 unit SolR Inject into the muscle.     ??? predniSONE (DELTASONE) 10 MG tablet Take 1 tablet (10 mg total) by mouth daily. 60 tablet 1   ??? predniSONE (DELTASONE) 20 MG tablet TAKE ONE TABLET BY MOUTH DAILY. 30 tablet 0   ??? ranitidine (ZANTAC) 300 MG tablet Take 1 tablet (300 mg total) by mouth 3 times a day. 90 tablet 11   ??? Ringer's solution,lactated (LACTATED RINGERS) infusion Intravenous 1,000 mLs once.     ??? triamcinolone (KENALOG) 0.1 % ointment APPLY 2  TIMES DAILY 80 g 11   ??? TRUE METRIX GLUCOSE TEST STRIP Strp USE TO TEST BLOOD SUGAR 2 TIMES DAILY 50 strip 11   ??? UNABLE TO FIND COMPOUNDED METHYLCOBALAMIN 1000 MCG EVERY OTHER DAY BY INJECTION/ TRISTATE 1000 mL 11   ??? VITAMIN D3-VITAMIN K2, MK4, ORAL Take by mouth.       No facility-administered encounter medications on file as of 04/27/2018.         Outpatient Medications Prior to Visit   Medication Sig Dispense Refill   ??? AMOXicillin-clavulanate (AUGMENTIN) 500-125 mg per tablet Take 1 tablet by mouth 3 times a day. 30 tablet 0   ??? blood-glucose meter Misc Use meter to test blood glucose twice daily 1 each 0   ??? busPIRone (BUSPAR) 10 MG tablet Take 2 tablets (20 mg total) by mouth 3 times a day. 180 tablet 2   ??? cetirizine (ZYRTEC) 10 mg Cap      ??? cromolyn (GASTROCROM) 100 mg/5 mL solution TAKE BY MOUTH 4 TIMES DAILY: BEFORE MEALS & AT BEDTIME 480 mL 0   ??? cyanocobalamin, vitamin B-12, (VITAMIN B-12 INJ) Inject 0.5 mLs as directed.     ??? diphenhydrAMINE (BENADRYL) 25 mg capsule Take 75-150 mg by mouth daily.     ???  doxepin (SINEQUAN) 10 MG capsule TAKE ONE (1) CAPSULE BY MOUTH AT BEDTIME. 30 capsule 11   ??? famotidine (PEPCID) 40 MG tablet Take 40 mg by mouth 2 times a day.     ??? fexofenadine (ALLEGRA) 180 MG tablet Take 1 tablet (180 mg total) by mouth daily. 30 tablet 3   ??? furosemide (LASIX) 20 MG tablet Take 1 tablet (20 mg total) by mouth daily. 90 tablet 3   ??? hydrOXYzine HCl (ATARAX) 25 MG tablet Take 1 tablet (25 mg total) by mouth every 8 hours as needed for Itching. 90 tablet 2   ??? lancets Misc Use on lancet twice daily to check blood glucose 200 each 1   ??? levalbuterol (XOPENEX) 1.25 mg/3 mL nebulizer solution Inhale 3 mL (1.25 mg total) by nebulization every 6 hours as needed for Wheezing. 72 mL 0   ??? levothyroxine (SYNTHROID, LEVOTHROID) 137 MCG tablet Take 1 tablet (137 mcg total) by mouth daily. 30 tablet 11   ??? metFORMIN (GLUCOPHAGE) 500 MG tablet TAKE ONE TABLET BY MOUTH TWICE DAILY WITH MEALS  60 tablet 11   ??? montelukast (SINGULAIR) 10 mg tablet TAKE ONE TABLET BY MOUTH AT BEDTIME. 30 tablet 11   ??? nebulizer and compressor Devi Use 1 ampule as directed every 4 hours. Indications: Please dispense 1 nebulizer unit 1 each 0   ??? onabotulinumtoxinA (BOTOX COSMETIC) 50 unit SolR Inject into the muscle.     ??? predniSONE (DELTASONE) 10 MG tablet Take 1 tablet (10 mg total) by mouth daily. 60 tablet 1   ??? predniSONE (DELTASONE) 20 MG tablet TAKE ONE TABLET BY MOUTH DAILY. 30 tablet 0   ??? ranitidine (ZANTAC) 300 MG tablet Take 1 tablet (300 mg total) by mouth 3 times a day. 90 tablet 11   ??? Ringer's solution,lactated (LACTATED RINGERS) infusion Intravenous 1,000 mLs once.     ??? triamcinolone (KENALOG) 0.1 % ointment APPLY 2 TIMES DAILY 80 g 11   ??? TRUE METRIX GLUCOSE TEST STRIP Strp USE TO TEST BLOOD SUGAR 2 TIMES DAILY 50 strip 11   ??? UNABLE TO FIND COMPOUNDED METHYLCOBALAMIN 1000 MCG EVERY OTHER DAY BY INJECTION/ TRISTATE 1000 mL 11   ??? VITAMIN D3-VITAMIN K2, MK4, ORAL Take by mouth.       No facility-administered medications prior to visit.        Objective:   Physical Exam  There were no vitals filed for this visit.    Gait and Stations: Normal    Mental Status Exam:   Appearance: via camera appears stated age, well developed, well nourished   Behavior/Movement: polite, cooperative.   Speech: regular rate, rhythm, tone and prosody.  Naming and repeating intact.  No aphasia. Production within normal limit. Fluent and nonpressured.  Mood: fine  Affect: euthymic, full range, mood congruent, content appropriate   Thought Process: organized and linear without any flight of ideas or loosening of associations.  Thought Content: Denies suicidal ideation, denies homicidal ideation. No ruminations or obsessions, no erotomanic, grandiose, persecutory, or bizarre delusions were noted.   Perceptions: Denies auditory hallucinations, denies visual hallucinations. Not seen responding to internal stimuli on  camera.   Cognition:   -Sensorium: alert   -Orientation: oriented to person, place, situation and year   -Memory: grossly intact    -Fund Of Knowledge: appears consistent with average intellect based on vocabulary,    -Insight: good    -Judgement: good    PHQ-9 Scores:   PHQ Total Score 03/30/2016 05/21/2016 06/24/2016 08/26/2016  09/29/2017   PHQ-9 Total Score 0 0 0 9 7       GAD7 Scores:   GAD7Total Score 08/26/2016 11/25/2016 02/03/2017 04/14/2017 09/29/2017   GAD-7 Total Score 10 6 2 6 4           Assessment/Plan:     Diagnoses:  -Somatic symptom disorder  -Unspecified anxiety disorder, mild  -Cluster B personality traits (particularly histrionic)      Her anxiety has significantly improved on the current medication regimen and with treatment through integrative medicine.  Will continue current medication regimen given beneficial effects.      # Anxiety, mild, significantly improved:  -Continue Buspirone 20 mg TID for anxiety  -Continue Hydroxyzine 25 mg TID PRN for anxiety. Encouraged to try taking medication PRN as opposed to scheduled due to risk of cognitive decline with long-term use of anti-histamine. Will start dropping 25mg  per visit starting from next visit. Patient agreeable to this plan.  -Continue treatment with integrative medicine for biofeedback, massage therapy and acupuncture  -Self-management: meditation, yoga  -RTC in 8 weeks  ??  Discussed risks/benefits/side effects/alternatives to medications. Discussed the risk of cognitive decline with long-term use of anti-histamine. Patient expressed understanding of and agreement to the current treatment plan.   ??  # Safety: The patient's imminent risk appears low, as she denies SI, does not express HI, no previous SA, future-oriented and goal-oriented to continue treatment. Patient expressed understanding of resources, including 911 and PES.  ??  # Substance:  -No active issues  ??  # Medical:  -Follow-up with PCP      Olga Millers, MD  ??  Patient plan discussed and  agreed upon by attending Dr. Thomasena Edis

## 2018-04-28 MED ORDER — lancets Misc
0 refills | Status: AC
Start: 2018-04-28 — End: 2019-08-13

## 2018-05-01 NOTE — Unmapped (Signed)
Spoke to the patient. She is doing overall well. She continues to have occasional angioedema and hives but they are not bothering her much. We discussed the plan of continuing her current allergy medication regimen and will follow up in person as soon as the allergy clinic re-opens after COVID is under control. She is very understanding of the situation and know how to reach me and the allergy clinic nurses if she needs anything.    Matilde Sprang, D.O.  Allergy and Immunology Fellow, PGY-5  Pager: [phone]

## 2018-05-10 NOTE — Unmapped (Signed)
Called patient and she states nurse from [facility] came out to house and was unable to flush port. Patient states its never felt like this before. She states once nurse was able to get needle in and flush but no blood return and it hurt like burning. The other few times, the needle would just pop back out. Spoke with possibility of port flipping with patient and that would need to be assessed in IR. She states she would be nervous about going that far to get port looked at. Offered her an appointment to have one of our nurses assess it and she states she would be willing to do that. Will forward to NP Ailene Ravel for additional advice/orders/managment.  Verbalized understanding and is agreeable with plan.  I called [facility] scheduling IR . Mertie Clause states that they are continuing to do PORT studies only if MD states it is medically necessary otherwise Port studies on hold until further notice. Will let Mearl Latin know this also.

## 2018-05-10 NOTE — Unmapped (Signed)
May 10, 2018 11:31 AM      Emily Bonilla has called to advise of the following:    ?? Today she attempted to have her port flushed. She noted the technician went through four needles and she experienced a severe pain. In addition, she experienced a mild anaphylactic episode.     ?? She states it felt like the fluid was blowing up inside the actual inside of the port. She noted a small amount was flushed the first two times;no blood return this time. She has expressed concern and requested to speak with [facility]  regarding this.    Please contact the patient at [phone] (home)

## 2018-05-11 NOTE — Unmapped (Signed)
Called patient and informed her that our front desk would call her to schedule a 2 week follow-up with Dr. Lourdes Sledge. She states understanding.

## 2018-05-12 ENCOUNTER — Ambulatory Visit: Admit: 2018-05-12 | Discharge: 2018-05-12 | Payer: PRIVATE HEALTH INSURANCE | Attending: "Endocrinology

## 2018-05-12 DIAGNOSIS — Z794 Long term (current) use of insulin: Secondary | ICD-10-CM

## 2018-05-12 NOTE — Unmapped (Signed)
Precharting complete with patient on 05/09/2018

## 2018-05-12 NOTE — Unmapped (Signed)
Patient ID:  58 y.o. female who presents for the continued  evaluation and management of osteopenia, diabetes and hypothyroidism    This was a phone conversation in lieu of an in-person visit. The patient provided verbal consent for an over the phone visit.    I spent 45 to 49 minutes on this call conducting an interview, performing a limited exam by phone, and educating the patient on my assessment and plan.    Past History:  Ms. 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       She reports being diagnosed with osteopenia in 2010 on outside DXA scan. Her fracture history includes a fractured tibia and fibula from falling down 2 steps after passing out.   DXA scan on 10/18/17 showed lowest T-score of -1.8 at the femoral neck    Labs on 06/02/17 showed vitamin D=10.5 and PTH=151pg/mL on 06/02/2017.  She was prescribed ergocalciferol but could not tolerate and was switched to vitamin D3 5000units daily.  Vitamin D=28.3 on 08/25/17.    She was diagnosed with prediabetes in 2017, but had rapid weight gain in early 2019 and progressed to Type 2 diabetes with A1c=7.3% on 06/09/17.  So she was started on metformin 500mg  BID. A1c=6.7% on 10/12/17    She also has a history of hypothyroidism d/t hashimoto's disease on levothyroxine. She saw Dr Angelia Mould on 06/29/2017, who performed in-office ultrasound that showed heterogenous thyroid with not discrete nodule, c/w thyroiditis.     Interval history:  Since her last appt on 11/10/17.  She has been treated on and off corticosteroids for Mast cell syndrome and respiratory infections through the winter, but has not taken any corticosteroids in the past 6 months.    She continues to take metformin 500mg  BID.  She checks her BG about 5x per week.  She reports having AM BG 150-170mg /dL and BGs 027-253 later in the day.  Outside A1c= 5.5% on 01/31/18    She  endorses having more fatigue and has gained 7 lbs.   She states that she was previously told that she has OSA, but does not have a CPAP.   She tries to practice good sleep hygiene, and is refreshed    Labs on 11/10/17 showed TSH=3.82uIU/mL, TT3=109.3ng/dL, GU4=4.03KV/QQ, VZ=56.3, OVF=64.3, PIRJJOACZ<66AY/TK, Ca=9.5mg /dL, ZSWF=09.3AT/FT, and DDU=20UR/KY.    She is taking vitamin D3 5000units every other day.  She does not take any calcium supplement, and consumes 1-2 serving of calcium rich foods.    She reports that her cholesterol has been high for years and \\states that she tried 3 medications in the past but was unable to tolerate due to muscle spasm and aches.   She reports an extensive family history of high cholesterol and cardiovascular disease.      PMH:  - Mast cell activation syndrome  - CVID  - osteopenia  - hypothyroidism d/t hashimotos's  - Type 2 diabetes mellitus    Family Hx:  Paternal grandmother - CADw/MI s/p CABG  Maternal grandfather - CADw/MI  Maternal uncle - CADw/MI  Maternal aunt - mastoctytosis, osteoporosis  Mom- thyroid cancer, hypothyroid, mastocytosis, HLD, osteoporosis  Niece- thyroid cancer, hypothyroid  Nephew- thyroid cancer, hypothyroid  Brother -  Type 1 diabetes, HLD, CADw/MI s/p CABG PCI     Social Hx:  - lives in [place], [place]  - No smoking, EtOH or illicit drugs    Current  Outpatient Medications   Medication Sig Dispense Refill   ??? AMOXicillin-clavulanate (AUGMENTIN) 500-125 mg per tablet Take 1 tablet by mouth 3 times a day. 30 tablet 0   ??? blood-glucose meter Misc Use meter to test blood glucose twice daily 1 each 0   ??? busPIRone (BUSPAR) 10 MG tablet Take 2 tablets (20 mg total) by mouth 3 times a day. 180 tablet 2   ??? cetirizine (ZYRTEC) 10 mg Cap      ??? cromolyn (GASTROCROM) 100 mg/5 mL solution TAKE BY MOUTH 4 TIMES DAILY: BEFORE MEALS & AT BEDTIME 480 mL 0   ??? cyanocobalamin, vitamin B-12, (VITAMIN B-12 INJ) Inject 0.5 mLs as directed.     ??? diphenhydrAMINE  (BENADRYL) 25 mg capsule Take 75-150 mg by mouth daily.     ??? doxepin (SINEQUAN) 10 MG capsule TAKE ONE (1) CAPSULE BY MOUTH AT BEDTIME. 30 capsule 11   ??? famotidine (PEPCID) 40 MG tablet Take 40 mg by mouth 2 times a day.     ??? fexofenadine (ALLEGRA) 180 MG tablet Take 1 tablet (180 mg total) by mouth daily. 30 tablet 3   ??? furosemide (LASIX) 20 MG tablet Take 1 tablet (20 mg total) by mouth daily. 90 tablet 3   ??? hydrOXYzine HCL (ATARAX) 25 MG tablet Take 1 tablet (25 mg total) by mouth every 8 hours as needed for Itching. 90 tablet 2   ??? lancets Misc USE TO TEST BLOOD SUGAR 2 TIMES DAILY- Easy touch twist 30g lancets 100 each 0   ??? levalbuterol (XOPENEX) 1.25 mg/3 mL nebulizer solution Inhale 3 mL (1.25 mg total) by nebulization every 6 hours as needed for Wheezing. 72 mL 0   ??? levothyroxine (SYNTHROID, LEVOTHROID) 137 MCG tablet Take 1 tablet (137 mcg total) by mouth daily. 30 tablet 11   ??? metFORMIN (GLUCOPHAGE) 500 MG tablet TAKE ONE TABLET BY MOUTH TWICE DAILY WITH MEALS 60 tablet 11   ??? montelukast (SINGULAIR) 10 mg tablet TAKE ONE TABLET BY MOUTH AT BEDTIME. 30 tablet 11   ??? nebulizer and compressor Devi Use 1 ampule as directed every 4 hours. Indications: Please dispense 1 nebulizer unit 1 each 0   ??? onabotulinumtoxinA (BOTOX COSMETIC) 50 unit SolR Inject into the muscle.     ??? predniSONE (DELTASONE) 10 MG tablet Take 1 tablet (10 mg total) by mouth daily. 60 tablet 1   ??? predniSONE (DELTASONE) 20 MG tablet TAKE ONE TABLET BY MOUTH DAILY. 30 tablet 0   ??? ranitidine (ZANTAC) 300 MG tablet Take 1 tablet (300 mg total) by mouth 3 times a day. 90 tablet 11   ??? Ringer's solution,lactated (LACTATED RINGERS) infusion Intravenous 1,000 mLs once.     ??? triamcinolone (KENALOG) 0.1 % ointment APPLY 2 TIMES DAILY 80 g 11   ??? TRUE METRIX GLUCOSE TEST STRIP Strp USE TO TEST BLOOD SUGAR 2 TIMES DAILY 50 strip 11   ??? UNABLE TO FIND COMPOUNDED METHYLCOBALAMIN 1000 MCG EVERY OTHER DAY BY INJECTION/ TRISTATE 1000 mL 11   ???  VITAMIN D3-VITAMIN K2, MK4, ORAL Take by mouth.       No current facility-administered medications for this visit.      Objective:  Physical Exam:    Psych: normal speech and mood, appropriate thought content,      Remaining exam deferred due to telephone visit.     Labs:  Component      Latest Ref Rng & Units 06/09/2017 08/25/2017 10/12/2017 11/10/2017   Creatinine  0.60 - 1.30 mg/dL    3.55   Glucose      70 - 100 mg/dL    732 (H)   Calcium      8.6 - 10.3 mg/dL    9.5   Phosphorus      2.1 - 4.7 mg/dL    4.1   Albumin      3.5 - 5.7 g/dL    4.5   eGFR NONAA CKD-EPI      See note.    86   T3, Total      60.0 - 220.0 ng/dL 20.2   542.7   Free T4      0.61 - 1.76 ng/dL 0.62 3.76  2.83   TSH      0.45 - 4.12 uIU/mL 6.11 (H) 7.40 (H)  3.82   Thyroid Peroxidase Ab      0.0 - 34.9 IU/mL 747.0 (H)      PTH      12.0 - 88.0 pg/mL 151.0 (H)   79.0   Vit D, 25-Hydroxy      30.0 - 100.0 ng/mL 10.5 (L) 28.3 (L)  37.7   Hemoglobin A1C      4.8 - 6.4 % 7.3 (H)      POC Hemoglobin A1C      4.0 - 5.6 %   6.7 (H)    LH      mIU/mL    19.2   FSH      mIU/mL    35.4   Estradiol      pg/mL    <20       Imaging:      Date LS Left FN Left TH Right FN Right TH 1/3 FA     10/18/17 BMD:0.970g/cm2  T-score: -0.7 SD   Z-score: 0.5 SD BMD:0.633 g/cm2  T-score: -1.9  SD  Z-score: -0.8 SD BMD:0.953 g/cm2   T-score: 0.1 SD   Z-score: 0.8 SD  BMD: 0.651 g/cm2  T-score: -1.8  SD  Z-score: -0.7 SD BMD: 0.877g/cm2   T-score: -0.5 SD   Z-score: 0.2 SD BMD: 0.650 g/cm2  T-score: -0.7  SD  Z-score: 0.3 SD       Assessment/Plan:  58 y.o. female with osteopenia, hypothyroidism, and Type 2 diabetes.    Ms. Muldrow has bone density in the osteopenic range.  She has multiple risk factors for low bone density including, sex, postmenopausal status, extensive corticosteroid use, secondary hyperparathyroidism, and family history.  Her estimated risk for fracture by FRAX is still below the threshold for treatment, but if she continues to have consistent  treatment with corticosteroids she should start an antiresorptive medication. We will continue to closely monitor her bone density by DXA.        She had a history of vitamin D deficiency and secondary hyperparathyroidism, which have imporved with vitamin D supplementation.  On last check her PTH was still near the upper limit of normal. She was encouraged to obtain 1200mg  of calcium daily in her diet and supplements and continue her vitamin D supplements.  We will check a renal panel, vitamin D and PTH levels with next labs      She also has hypothyroidism d/t hashimoto's disease and have been treated with levothyroxine.  She was euthyroid on her last TFT check, but complains of increased fatigue and weight gain.  Continue levothyroxine daily for now, check TSH and FT4 with next labs.      It is unlikely that her  rapid weight gain is related to her hypothyroidism, ad more likely related to sleep apnea.   Her elevated morning BGs can also be related to sleep apnea.  Therefore she was encouraged to talk to her PCP or sleep medicine about treatment for sleep apnea.    She has excellent glycemic control as indicated by her A1c=5.5%.  Therefore we will continue her current regimen of Metformin 500mg  BID.   She has not had routine screening labs recently, so we will obtain A1c, lipid panel and urine microalbumin screen.  She does endorse previously being told that she had high cholesterol, but is unable to tolerate statins.  Given her significant family history, she likely has FH.  We will discuss treatment options in more detail after getting the results of her lipid panel.    Plan:  - continue metformin 500mg  BID  - Check lipid panel  - continue levothyroxine daily, for now  - check TSH, FT4  - check midnight salivary cortisol  - 1200mg  of calcium daily form diet and supplements combined  - check a renal panel, vitamin D and PTH levels.  - DXA scan in 10/2018  Fax orders to [facility]  fax number: [phone]    Follow-up after DXA in 6 months.

## 2018-05-15 NOTE — Unmapped (Signed)
Faxed lab orders over to [phone]

## 2018-05-15 NOTE — Unmapped (Signed)
-----   Message from Orbie Pyo, MD sent at 05/12/2018  5:19 PM EDT -----  Merrily Pew,     Can you please fax the lab orders for this patient to [facility] fax number: [phone]?  Please send the lab order from today, and the 2 midnight salivary cortisol orders from 11/10/17.    After you faxed them, please call the patient to confirm, and schedule her for a DXA scan and follow-up in 6 months.    Thanks,   [employer]

## 2018-05-15 NOTE — Unmapped (Signed)
Emily Bonilla/[name] received orders for labs and asked for MD to fax over orders for home health care.Pls fax to [phone]  Any questions Helmut Muster can be reached at [phone]

## 2018-05-17 NOTE — Unmapped (Signed)
Pt called in stated they still had not received her lab orders.   I looked at the note and the orders were faxed to the wrong number   FAX is [phone]  Please refax.   Pt also wants to make aware she is no longer with [facility] she is with [facility] their phone is [phone]

## 2018-05-17 NOTE — Unmapped (Signed)
Lab orders printed and faxed to # provided.

## 2018-05-19 NOTE — Unmapped (Signed)
Patient is calling to advise that she provided the incorrect fax number for [facility]. Please re ax lab orders to [phone] [facility].

## 2018-05-19 NOTE — Unmapped (Signed)
Faxed via Epic to number given.

## 2018-05-19 NOTE — Unmapped (Signed)
Marcelle Smiling calling to inform provider that patient had an elevated blood pressure of 150/94 with heart rate of 88.  Patient asymptomatic.  She also had a mild anaphylactic reaction per Marcelle Smiling and states she was able to take the benadryl and was fine by the end of the appointment.  Marcelle Smiling can be reached at [phone].

## 2018-05-22 NOTE — Unmapped (Signed)
Received paperwork from [facility] regarding PO date 05/19/2018 order # [id number]. Paperwork will be put in the MD's mailbox today for completion and signing Lottie Mussel 05/22/2018 10:24 AM

## 2018-05-22 NOTE — Unmapped (Signed)
Completed paperwork and faxed to number given Emily Bonilla 05/26/2018 11:24 AM

## 2018-05-23 MED ORDER — cromolyn (GASTROCROM) 100 mg/5 mL solution
100 | ORAL | 0 refills | 24.00000 days | Status: AC
Start: 2018-05-23 — End: 2018-10-19

## 2018-05-25 ENCOUNTER — Ambulatory Visit: Admit: 2018-05-25 | Payer: PRIVATE HEALTH INSURANCE

## 2018-05-25 ENCOUNTER — Ambulatory Visit: Admit: 2018-05-25 | Discharge: 2018-05-25 | Payer: PRIVATE HEALTH INSURANCE

## 2018-05-25 DIAGNOSIS — D8940 Mast cell activation, unspecified: Secondary | ICD-10-CM

## 2018-05-25 MED ORDER — alteplase (CATHFLO) injection 2 mg
2 | Freq: Once | Status: AC
Start: 2018-05-25 — End: 2018-05-25
  Administered 2018-05-25: 19:00:00 2 mg

## 2018-05-25 MED FILL — CATHFLO ACTIVASE 2 MG INTRA-CATHETER SOLUTION: 2 2 mg | Qty: 2

## 2018-05-25 NOTE — Unmapped (Signed)
Pt called to inform office that [facility] took her recent lab draw to [facility] instead of [facility].Pt stated her frustrations and stated not being an established pt at [facility].Pt asked if theres something the office could do to obtain her results?Pt also asked if nurse could communicate with [facility] regarding future orders

## 2018-05-25 NOTE — Unmapped (Signed)
Chief Complaint   Patient presents with   ??? Mast cell activation syndrome     Follow Up          History of Present Illness  HPI  Emily Bonilla is a 58 y.o. 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   This was copied from patient email in May 2019.  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 Dr Rogelio Seen( Allergist), Dr F Awan(hematology), Dr Leonard Downing( neurologist). ??Although, I would go to [facility] as needed for tweeks in my care.   IV fluids were very important to keep my body stable. ??It's amazing how good I feel after getting fluids. ??When my body stresses my Mast cells flair, I flush more and have increased diarrhea which is not helpful for fluid retention, increased inflammation, brain fog, extreme fatigue, BP drops with passing out( syncope) and until the last 2 years my lungs were very bad. ( I'm appreciative that the last two years my lungs have been great!!), itching, VCD, MTD issues, and two more Life flight transports needed in 2009, 2011 both while on vacation out of state. I had severe reactions that could not be controlled by local ER so I was air vac to hospitals that knew how to treat Mast Cell Activation. ?? In 2007 Dr Carmon Ginsberg Awan(he is at [facility] now)Hematology was brought onto my care. ??   He evaluated me from top to bottom. ??Found that I would do better on IVIG, IV fluids, IV iron and magnesium. ?? B 12 levels fluctuated but never as low as they are now. ??I Always had issues with folic acid levels. But are ok now.   Back to Dr Nelle Don after my veins developed so much scar tissue and Emergency Response teams could not get IV in during emergencies and the IVIG replacement needed my first port was put in 2008. ??I had that port until 2013. A new port was put in on opposite side in 2014 due  to sluggish Infusions. [facility] put me on Daily IV fluids1000 cc of LR, IV push benadryl q4-6 hours via my port. ??Steroids as needed. ??I was in a awful flair for several years. ??As hard as it was to keep up with the regiment ??I felt so much better and was able to enjoy life more, see friend and travel.. The summer of 2015 was when I kept having increased seizures and syncope in grocery stores produce dept( [employer] and bi [employer]) the seizures wer increasing big time. ??So the Allergist put me back on doxipen and steroids with my IV benadryl and IV fluids I got better. June 2017 broke legs after syncope from smelling perfum. Allergic reactions to O2 mask and antibiotics and   pain meds triggered symptoms and my face, lower lip, eyes swelled and then became infected. ??I was hospitalized 3 months for face surgery, left tibia fib multipal break and mast cell reaction. CVID was kicking in as my IgG levels were 319. My port was taken out and a pic line put in just incase my port compromised. ?? My port came back clean from infection. ??After I was discharged from hospital to rehab nursing home to slowly ambulate as I had not been at bearing for 4 months. ??I  stayed 3 weeks as they had cockroaches and could not take care of my dietary needs. ??I left and rehabbed myself. ??Since then I have had flairs related to allergy to hardware to left tibia fib and the transition off my IV fluids and trying to find a hospital that could treat me/not afraid of my conditions. ??That took 4 years!!!!!!!! ??   During all the medical issues I was going through a divorce Oct 2012.   That was rough transission as my drs of 14 years retired and/or left [employer] [facility] [place] after affordable health care act started to be implimented.   Lots of changes and I went to get psychotherapy weekly to bi weekly for the next 4 years. ??This was very important to my over all health. My ex and I went to marriage  counciling 4 years weekly to deal with the stressors of my illness on our marriage before the divorce.     Now my thoughts about B12 is if you find one I can tolerate let's get this on board. ??I talked to my Nurse case Manager(kelly) last week and she will aprove IV or IM as needed and she suggested [facility] if they don't feel comfortable she will aprove inpatient.   I'll try the oral Vit D and again [name], my nurse case manager for [employer] on aging will aprove what you order for me. ??So just fax the order of what you want me to be on. ??[name] will get it approved and have [facility] deliver it. ??I dont have a compounding pharmacy locally. ?? If you can suggest one and they can compound anything you order me I'll see how the insurance pays.   SLOW is a good thing. ??My body can't tolerate to many changes at one time. ??Doing one med then add another med.........   I get overwhelm fast lately. ??I'm sorry but this is getting difficult after all the years....   I'm just getting tired....     She states her last bone marrow biopsy 3 years ago in [place]. Her last subcutaneous Immunoglobulin given 2 years ago. She is hoping to restart IV fluids at home. She had been receiving IV fluids three times a week. Her port has been removed and she is hoping to have new port placed.    Interval History:  Emily Bonilla return for a problem visit. Access to port is painful and it is not giving any blood. She has this port for about a year getting home IV fluid 2 days a week, say because of it she does really well on weekend. Overall feeling better. No new issues. chronic symptoms noted below same or better         Histories   She has a past medical history of Anaphylaxis, Anemia, Anxiety, Asthma, Atrial fibrillation (CMS Dx), Bronchitis, Heart disease, Neurological disease, OSA (obstructive sleep apnea), Osteoporosis, Psychogenic nonepileptic seizure, Seizures (CMS Dx), Spontaneous  pneumothorax, Thyroid disease, Ulcer, and WPW (Wolff-Parkinson-White syndrome).    She has a past surgical history that includes Tibia fracture surgery; Tonsillectomy; Liver biopsy; Cardiac electrophysiology mapping and ablation; Facial cosmetic surgery; Bone marrow biopsy; removal hardware leg (Left, 08/17/2016); dental extraction (Right, 04/25/2017); Cholecystectomy (2015); Hysterectomy (2009); and insertion catheter vascular access (N/A, 09/06/2017).    Her family history includes Aneurysm in her mother; Kidney failure in her mother.    She reports that she has never smoked. She has never used smokeless tobacco. She reports that  she does not drink alcohol or use drugs.    Allergies  Acetaminophen; Adhesive tape-silicones; Alcohol; Amitriptyline; Aspirin; Benzoic acid; Blue dye; Epinephrine; Flecainide; Gabapentin; Iodinated contrast media; Isopropyl alcohol; Latex; Levofloxacin; Lidocaine; Nsaids (non-steroidal anti-inflammatory drug); Onion; Other; Paraben; Perfume; Pregabalin; Preservative; Quetiapine; Red dye; Sulfa (sulfonamide antibiotics); Sulfur; Trazodone; Verapamil; Yellow dye; Green dye; Onion extract; Doxepin; Duloxetine; Epi e-z pen; Hydroxyzine; Montelukast; Statins-hmg-coa reductase inhibitors; and Verapamil (bulk)    Medications  Current Outpatient Medications   Medication Sig   ??? AMOXicillin-clavulanate Take 1 tablet by mouth 3 times a day.   ??? blood-glucose meter Use meter to test blood glucose twice daily   ??? busPIRone Take 2 tablets (20 mg total) by mouth 3 times a day.   ??? cetirizine    ??? cromolyn TAKE BY MOUTH 4 TIMES DAILY: BEFORE MEALS & AT BEDTIME   ??? cyanocobalamin, vitamin B-12, (VITAMIN B-12 INJ) Inject 0.5 mLs as directed.   ??? diphenhydrAMINE Take 75-150 mg by mouth daily.   ??? doxepin TAKE ONE (1) CAPSULE BY MOUTH AT BEDTIME.   ??? famotidine Take 40 mg by mouth 2 times a day.   ??? fexofenadine Take 1 tablet (180 mg total) by mouth daily.   ??? furosemide Take 1 tablet (20 mg total) by  mouth daily.   ??? hydrOXYzine HCL Take 1 tablet (25 mg total) by mouth every 8 hours as needed for Itching.   ??? lancets USE TO TEST BLOOD SUGAR 2 TIMES DAILY- Easy touch twist 30g lancets   ??? levalbuterol Inhale 3 mL (1.25 mg total) by nebulization every 6 hours as needed for Wheezing.   ??? levothyroxine Take 1 tablet (137 mcg total) by mouth daily.   ??? metFORMIN TAKE ONE TABLET BY MOUTH TWICE DAILY WITH MEALS   ??? montelukast TAKE ONE TABLET BY MOUTH AT BEDTIME.   ??? nebulizer and compressor aerosol device Use 1 ampule as directed every 4 hours. Indications: Please dispense 1 nebulizer unit   ??? onabotulinumtoxinA (cosmetic) Inject into the muscle.   ??? predniSONE Take 1 tablet (10 mg total) by mouth daily.   ??? predniSONE TAKE ONE TABLET BY MOUTH DAILY.   ??? ranitidine Take 1 tablet (300 mg total) by mouth 3 times a day.   ??? lactated ringers Intravenous 1,000 mLs once.   ??? triamcinolone APPLY 2 TIMES DAILY   ??? True Metrix Glucose Test Strip USE TO TEST BLOOD SUGAR 2 TIMES DAILY   ??? UNABLE TO FIND COMPOUNDED METHYLCOBALAMIN 1000 MCG EVERY OTHER DAY BY INJECTION/ TRISTATE   ??? VITAMIN D3-VITAMIN K2, MK4, ORAL Take by mouth.     No current facility-administered medications for this visit.          The following portions of the patient's history were reviewed and updated as appropriate: allergies, current medications, past family history, past medical history, past social history, past surgical history and problem list.    Review of Systems   Constitutional: Positive for activity change and fatigue. Negative for appetite change, chills, diaphoresis, fever and weight loss.   Respiratory: Negative for cough and shortness of breath.    Cardiovascular: Negative for chest pain and leg swelling.   Gastrointestinal: Positive for constipation and diarrhea. Negative for abdominal pain, nausea and vomiting.   Musculoskeletal: Negative for gait problem.   Skin: Positive for color change. Negative for pallor and rash.   Neurological:  Positive for headaches. Negative for dizziness, syncope, speech difficulty, weakness and light-headedness.   Psychiatric/Behavioral: Negative for confusion. The  patient is nervous/anxious.        Vitals  BP (!) 151/114 (BP Location: Left arm)    Pulse 107    Temp 98.6 ??F (37 ??C) (Temporal)    Resp 16    Ht 5' 7 (1.702 m)    Wt 217 lb (98.4 kg)    LMP  (LMP Unknown)    SpO2 92%    BMI 33.99 kg/m??     Pain Location:   Pain Score:  0-No pain [0]    Physical Exam   Vitals reviewed.  Constitutional: She is oriented to person, place, and time. She appears well-developed and well-nourished. No distress.   Cardiovascular: Normal rate.   Pulmonary/Chest: Effort normal. No respiratory distress.   Neurological: She is alert and oriented to person, place, and time.   Skin: Skin is warm and dry. No rash noted. She is not diaphoretic. No erythema. No pallor.   Psychiatric: She has a normal mood and affect. Her behavior is normal. Judgment and thought content normal.      Neurologic Exam     Mental Status   Oriented to person, place, and time.       Review of Lab Results  Lab Results   Component Value Date    WBC 12.6 (H) 05/31/2017    HGB 14.8 05/31/2017    HCT 45.2 (H) 05/31/2017    PLT 317 05/31/2017    CREATININE 0.72 05/26/2018    BUN 9 05/26/2018    PROT 6.6 10/28/2016    AST 16 10/28/2016    ALT 14 10/28/2016    BILITOT 0.3 10/28/2016    CALCIUM 9.0 05/26/2018    MG 2.0 06/09/2017         Imaging   No results found.    Investigations Reviewed:   Labs      Cancer Staging:  Cancer Staging  No matching staging information was found for the patient.                   Assessment & Plan  Ms. 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  She has been under the care of several physicians, recently moved to [place] and trying to find stable physician coverage for her.      Will give cath flow to see if  port works     I have again clearly explained to her that I would not be able to help her with many of the other management needs. She will continue follow-up with her allergist and PCP for other mast cell stabilizing medications. I have asked her that they should also take over ordering of her fluids too as I have no expierience in this.      I will see her as needed only       Although majority of this note is pull forward and copied from previous note, the information have been reviewed and accurately reflect today's visit.

## 2018-05-25 NOTE — Unmapped (Signed)
Pt here to see Dr Lourdes Sledge and for port flush.  Pt states her home nurse was unable to access port - reported pain when home nurse attempted to flush port.  Home nurse instructed patient to call doctor to have port looked at.  PAC accessed - flushes easily but no blood return.  Pt denies any pain with flushing.  Instilled cathflo for 45 min - good blood return and flushed easy.  Flushed PAC with normal saline then heparin and left accessed for use with fluids at home.  Home health nurse will deaccess tomorrow.  Pt tolerated well.  Pt d/c ambulatory to home and will follow up with allergy doc per Dr Lourdes Sledge.

## 2018-05-25 NOTE — Unmapped (Signed)
COVID-19 Screen:    1) Do you have a fever? No  If yes,   - What has your fever been?    - How long have you had it?    - Have you taken any medication to treat the fever?      2) Do you have a new or worsened cough? No  If yes,   - How would you describe your cough?    - How long has it been going on?    - Have you taken any medication to treat the cough?      3) Do you have new or worsened shortness of breath? No  If yes,   - How long has it been going on?    - Have you taken any medication to relieve your shortness of breath?      4) Has the patient traveled to [place], [place], [place], [place], or [place] within the last 14 days? No    5) Has the patient had close contact with someone with a confirmed case of COVID-19 in the last 14 days? No    COVID-19 SCREEN = negative  *Screen positive if pt answers YES to 1, 2, 3 AND question 4 OR 5*  *If screen positive - escalate with a call to the clinic*    *If screen negative - route per protocol      ADDITIONAL INFORMATION:

## 2018-05-25 NOTE — Unmapped (Signed)
Called to request lab orders from [facility] due to not finding patient in [facility]where under [facility].     LM for patient to notify of this.

## 2018-05-26 ENCOUNTER — Ambulatory Visit: Admit: 2018-05-27 | Payer: PRIVATE HEALTH INSURANCE

## 2018-05-26 DIAGNOSIS — E119 Type 2 diabetes mellitus without complications: Secondary | ICD-10-CM

## 2018-05-26 LAB — TSH: TSH: 4.73 u[IU]/mL (ref 0.45–4.12)

## 2018-05-26 LAB — RENAL FUNCTION PANEL W/EGFR
Albumin: 3.9 g/dL (ref 3.5–5.7)
Anion Gap: 9 mmol/L (ref 3–16)
BUN: 9 mg/dL (ref 7–25)
CO2: 28 mmol/L (ref 21–33)
Calcium: 9 mg/dL (ref 8.6–10.3)
Chloride: 107 mmol/L (ref 98–110)
Creatinine: 0.72 mg/dL (ref 0.60–1.30)
Glucose: 82 mg/dL (ref 70–100)
Osmolality, Calculated: 296 mOsm/kg (ref 278–305)
Phosphorus: 3.2 mg/dL (ref 2.1–4.7)
Potassium: 3.9 mmol/L (ref 3.5–5.3)
Sodium: 144 mmol/L (ref 133–146)
eGFR AA CKD-EPI: >90 See note.
eGFR NONAA CKD-EPI: >90 See note.

## 2018-05-26 LAB — T4, FREE: Free T4: 0.93 ng/dL (ref 0.61–1.76)

## 2018-05-26 LAB — HEMOGLOBIN A1C: Hemoglobin A1C: 7.1 % (ref 4.0–5.6)

## 2018-05-26 LAB — LIPID PANEL
Cholesterol, Total: 221 mg/dL (ref 0–200)
HDL: 32 mg/dL (ref 60–92)
LDL Cholesterol: 117 mg/dL
Triglycerides: 358 mg/dL (ref 10–149)

## 2018-05-26 LAB — VITAMIN D 25 HYDROXY: Vit D, 25-Hydroxy: 33.8 ng/mL (ref 30.0–100.0)

## 2018-06-01 ENCOUNTER — Ambulatory Visit: Payer: PRIVATE HEALTH INSURANCE | Attending: Otology & Neurotology

## 2018-06-01 NOTE — Unmapped (Signed)
Called patient to let her know this information. Patient states that she wants to talk to PCP about her IGG levels and the fact that she is getting IV fluids and getting those transferred to PCP to take over from the Hematologist. Patient states that she's not getting any care from the Hematologist which is why she needs to get everything transferred to PCP. Patient states that overall, she is stable. She states that she is giving herself B12 injections every other day and would need PCP to pick those up also. Patient states that she is willing to do a phone visit with PCP if she needs to. I told patient that I would send a message to PCP and give her a call back with what PCP says.

## 2018-06-01 NOTE — Unmapped (Signed)
Pt calling for in person visit. States not seen in over a year. Needs to have you take over care now that all problems have been addressed. Needs to reestablish with PCP. No schedule available.

## 2018-06-01 NOTE — Unmapped (Signed)
Given patient's high risk, we will not be able to see her for an in person visit within the next month. ??We will try to schedule her for an appointment in June or July.     She may benefit from establishing from a PCP that is not on main-campus (one of the peripheral offices) to minimize her exposure.     Please do not schedule this patient for an in person visit.     Thank you    Routing comment

## 2018-06-01 NOTE — Unmapped (Signed)
I called patient to let her know this information. Patient states that she just needs someone to take over her IV hydration and isn't sure who she needs to contact about that. She is wanting to know who she should contact about this to get them to over see this for her. Patient states that she has been on these for almost a year and has been doing really well and has not fallen or anything.      Please inform patient that I do not have any experience with this, and will not be able to manage her IVF's, IGG levels, or home injections (including B12 injections). ??Our practice will not be able to take over these treatment plans and she will need to get them from another provider.     Thank you

## 2018-06-14 ENCOUNTER — Ambulatory Visit: Admit: 2018-06-14 | Discharge: 2018-06-30 | Payer: PRIVATE HEALTH INSURANCE

## 2018-06-14 DIAGNOSIS — E1169 Type 2 diabetes mellitus with other specified complication: Secondary | ICD-10-CM

## 2018-06-14 NOTE — Unmapped (Signed)
[facility] Internal Medicine  Faculty Practice Acute Telemedicine Phone Visit  Subjective:   Pre-visit planning done, including pre-visit huddle with nursing and MA staff prior to session.    Patient ID: Emily Bonilla is a 58 y.o. lady with a history of anxiety and depression, CVID (common variable immunodeficiency), mast cell activation syndrome, as well as obesity, hypothyroidism, hypoparathyroidism, seizures, painful orthopaedic hardware, as well as diabetes mellitus 2, who has not been seen in clinic since 12/30/2016, who called for an acute telemedicine visit for follow up for above.    HPI   I'm doing better overall.  Patient states she had her port placed last year, and has started her IV fluids. Has not had a syncopal episode in the past 6 months.   Patient states her port was placed by Dr. Lourdes Sledge, and has been managing her IV fluids. She states she saw him ~ 3 weeks ago, and she states he had asked her to find someone else to manage the fluids. She states she is also followed by allergy.      Patient states she had cataract surgery at [facility] over the past year.  everything went ok.      Chronic neck pain: patient was followed by pain clinic, as well as integrative medicine (including acupuncture), as well as physical therapy.     B12 deficiency: on injectable B12 by endocrine. we found a B12 that I could tolerate.     Hyperthyroidism, Hashimoto's: followed by endocrine and ENT.     Hyperparathyroidism: followed and managed by endocrine.     Vertigo: followed by ENT.       Patient is followed by multiple specialists, including rheumatology, hem/onc, endocrine, neurology, allergy, pain management, physical therapy, as well as psychiatry.      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 She states her her BG have been elevated in the morning.  She s followed by endocrinology.  Has pending workup once she is off of steroids.   Lab Results   Component Value Date    HGBA1C 7.1  (H) 05/26/2018    HGBA1C 7.3 (H) 06/09/2017      Anxiety and depression: she is followed by psychiatrist.  She states she has been doing well.  She has been practicing proper sleep hygiene. She also meditates regularly.     Obesity: she has been trying to eat no more than 1200-1400 kCal daily. She also has been followed by a nutritionist. She has not been exercising.  She does do some stretches with meditation.        The following portions of the patient's history were reviewed and updated as appropriate: allergies, current medications, past family history, past medical history, past social history, past surgical history and problem list.      Review of Systems   Constitutional: Negative for chills, diaphoresis, fatigue and fever.   Respiratory: Negative for cough, shortness of breath, wheezing and stridor.    Cardiovascular: Negative for chest pain and palpitations.   All other systems reviewed and are negative.    Objective:   Physical Exam  Nursing note reviewed.   Constitutional:       Comments: Patient sounded normal (at her baseline) on the phone, speaking in complete sentences, sounded comfortable, not in any acute physical or emotional distress.      Neurological:      Mental Status: She is oriented to person, place, and time. Mental status is at baseline.  Psychiatric:         Mood and Affect: Mood normal.         Thought Content: Thought content normal.         Judgement: Judgment normal.       Assessment & Plan:   Diabetes mellitus 2 in obese: moderately well controlled  - continue metformin XR 500 mg po BID.   - discussed lifestyle changes  - SMART goals set - will try to exercise - cardio 30 min twice daily   - continue with meditation, mindfulness.     CVID (common variable immunodeficiency), mast cell activation syndrome: stable, followed by hem/onc  - continue to follow up with hem/onc    Hypothyroidism and hyperthyroidism: stable, followed and managed by endocrine  - continue to follow up with  endocrine, ENT    Hypoparathyroidism: stable, managed and followed by endocrine and ENT  - follow up with ENT    Seizure disorder: stable, managed by neurology  - continue current regimen and follow up with neurology.     Migraine headaches without aura: has been well controlled with botox injections by neurology  - continue to follow up with neurology    Counseling on health promotion and disease prevention: Extensively discussed COVID19 preventative measures, including social distancing, frequent hand washing, etc.  Handout in AVS, which will be mailed to patient.      Patient understands his/her medication(s). Proper use, potential side effects of these medications,  as well as barriers to using these medications were reviewed and addressed, as were over-the-counter medications and supplements during encounter. Response and side effects to current medications also reviewed.      This was a phone conversation in lieu of an in-person visit. The patient provided verbal consent for an over the phone visit.    I spent 30 to 34 minutes  on this call conducting an interview, performing a limited exam by phone, and educating the patient on my assessment and plan.         Court Joy, MD, FACP  Assistant Professor of Clinical Medicine  [facility] Faculty Practice  [facility]  Department of Internal Medicine  06/14/2018   12:30 PM

## 2018-06-14 NOTE — Unmapped (Signed)
It was nice talking to you on the phone during your phone visit!  During these times it is particularly important to try to stay healthy.  Given your medical conditions, your risk of complications from a COVID19 virus infection could be detrimental and life threatening.  Please be sure to practice the following:     Make sure to take your medications as before!  If you are close to running out of your medications, be sure to call your pharmacy AS SOON AS POSSIBLE to get refills.     STAY ACTIVE! This can be challenging when we're stuck at home, but still vital.   - Avoid overeating while at home - try to have set meal times and avoid eating outside of those settings (avoid snacking)  - Drink water when thirsty (no calories from liquid food sources) - high calorie drinks such as juice and sugar drinks add calories without filling you up!    - Eat at least 8 servings of fresh fruits and vegetables (except bananas! They have lots of sugar).    - Walk at least 30 min a day - this can be indoors, or if the weather is nice, outdoors (as long as you maintain 6 feet of distance between you and others)  - Try to practice Mindfulness - before eating, ask yourself Am I hungry?  Or am I eating for a different reason?.  I've included a hunger scale below - try to stay in the 4-6 range.  Don't let yourself get too hungry, and don't overeat.     Please don't hesitate to call us if you have any questions or concerns.       You can protect yourself and help prevent spreading the virus to others if you:    DO THE FIVE  Help stop coronavirus  1. HANDS - Wash them often, with warm soap & water for at least 20 seconds (Happy Bday song x 2)  2. ELBOW - Cough or sneeze into it, or into a disposable tissue  3. FACE - Don't touch it  4. SPACE - Keep safe distance (Social distancing) at least 1 meter or 3 feet with others   5. HOME - Stay if you can, try to self-isolate    Don't:  - Touch your eyes, nose, mouth or face if your hands are  not clean  - Go to public places where there are others (restaurants, bars, anyplace where more than 2 people congregate)     Exercising while quarantining:   - make sure to stay active - at least 30-60 minutes daily by doing any of the following:   - Calisthenics: this can be jumping jacks, running in place, as well as pushups, using household items as weights (water bottles, cans of food, etc)      - Workout video (this can be Yoga, aerobics, or any type of guided exercise) on Youtube or anywhere online   - Going for a walk outside!  In your backyard, or anywhere where you are able to maintain a distance of at least 6 feet from others.   - Dancing!  With music, or to your own beat :)        Socializing: staying at home can be very lonely and frustrating.  Mixing this with all the frustrating news, this can make for a recipe for a depressed mood and anxiety.   - try meeting and talking with friends and loved ones via telephone conferencing.  There are a   lot of free apps that you can use to video conference, such as:    - FaceTime (iPhone only)   - WhatsApp   - Telegram    - Viber    - Skype    - Imo  Some of these apps allow for multiple people to video conference at the same time, allowing you to have a virtual gathering.     If you feel down, depressed, hopeless, or overwhelmed, be sure to reach out to friends, family, as well as your healthcare providers' office.

## 2018-06-14 NOTE — Unmapped (Signed)
What is the best phone number to reach you at?  [phone] (home)     Telephone Information:   Mobile [phone]       Do you need any refills today?    Do you have any allergies to any medications? See list   What pharmacy are you using today? [email or web address]     What are the concerns you would like to discuss with the doctor today? IV Fluids and hematologist is not overseeing this anymore     Future Appointments   Date Time Provider Department Center   06/14/2018  3:00 PM Court Joy, MD [facility] FAC HOX HOX   06/30/2018  2:00 PM Stefan Church, CNP [facility] NEUR [facility]   07/06/2018  1:15 PM Shafi Lodhi [facility] PSY OP OP   07/13/2018  9:20 AM Ilona Dubuske V, DO [facility] ALG HOX HOX       This was a phone conversation in lieu of an in-person visit. The patient provided verbal consent for an over the phone visit.    During the conversation I reconciled the medication list and the allergy list.  I also provided the appropriate screening.      Patient is aware that MD will be calling and he/she will be near the phone.

## 2018-06-15 ENCOUNTER — Ambulatory Visit: Payer: PRIVATE HEALTH INSURANCE

## 2018-06-19 ENCOUNTER — Encounter: Payer: PRIVATE HEALTH INSURANCE | Attending: Family

## 2018-06-21 IMAGING — CT CT HEAD W/O CM
5 of 8 series · 17 of 47 positions shown, 19 images · non-contrast
Comparison: None.

CLINICAL DATA: Unwitnessed fall.

EXAM:
CT HEAD WITHOUT CONTRAST
CT CERVICAL SPINE WITHOUT CONTRAST
TECHNIQUE: Multidetector CT imaging of the head and cervical spine was
performed following the standard protocol without intravenous
contrast. Multiplanar CT image reconstructions of the cervical spine
were also generated.

[Series 3: head w/o · axial · non-contrast · 0.43mm/px · z∈[-55,-5]mm · 2 of 31 slices shown]
[im 11/31  brain]
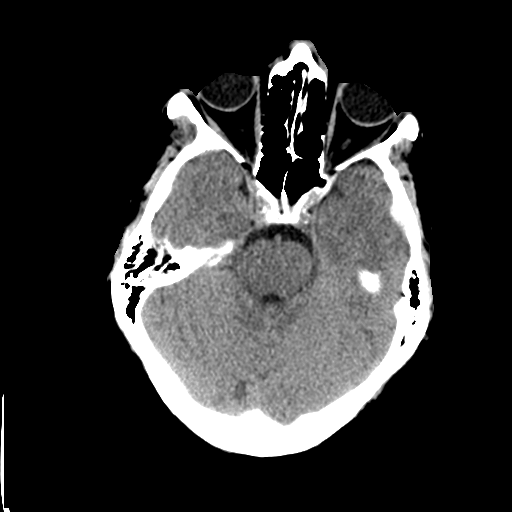
[im 21/31  brain]
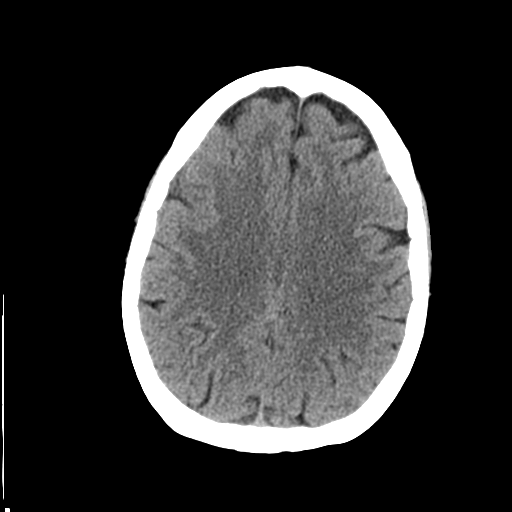

[Series 4: bone windows · axial · 0.43mm/px · z∈[-75,-45]mm · 2 of 52 slices shown]
[im 11/52  bone]
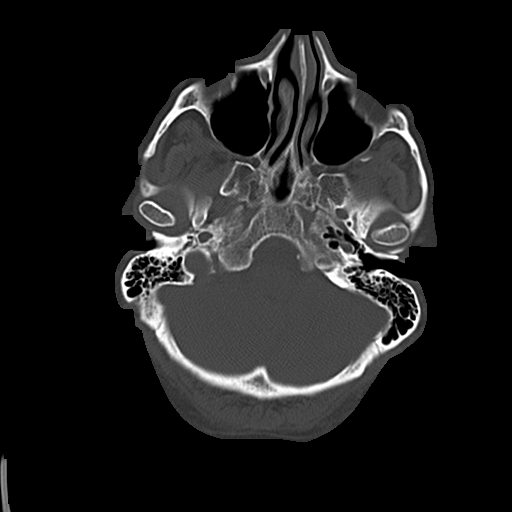
[im 21/52  bone]
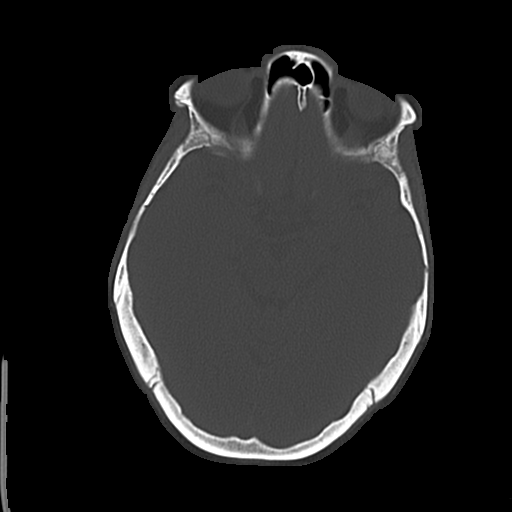

[Series 5: coronal · coronal · 0.28mm/px · 3 of 62 slices shown]
[im 23/62  brain]
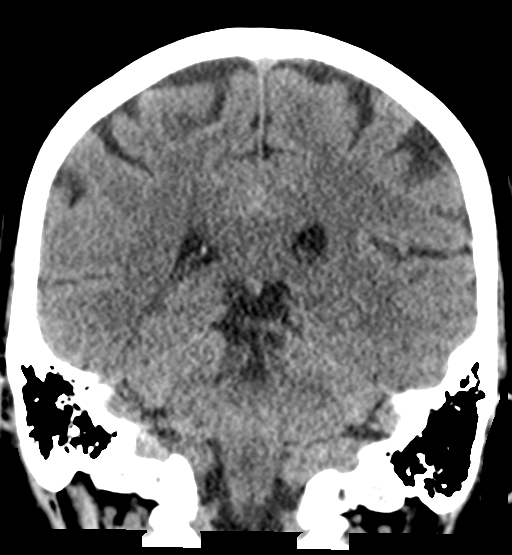
[im 31/62  brain]
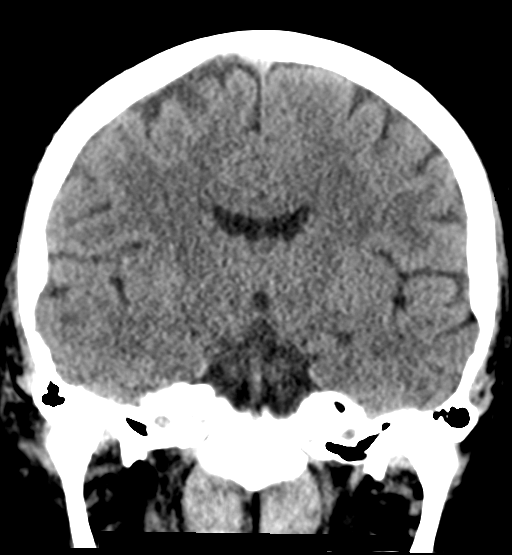
[im 39/62  brain]
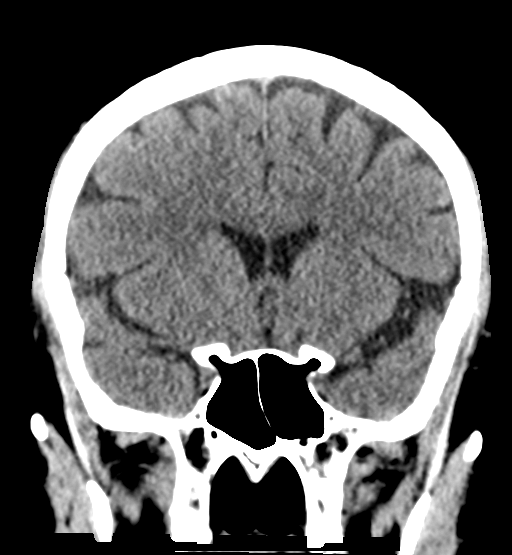

[Series 6: sagittal · sagittal · 0.30mm/px · 2 of 49 slices shown]
[im 17/49  brain]
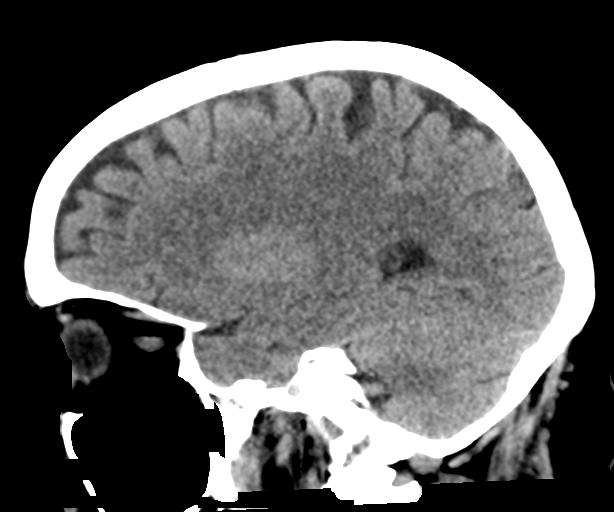
[im 33/49  brain]
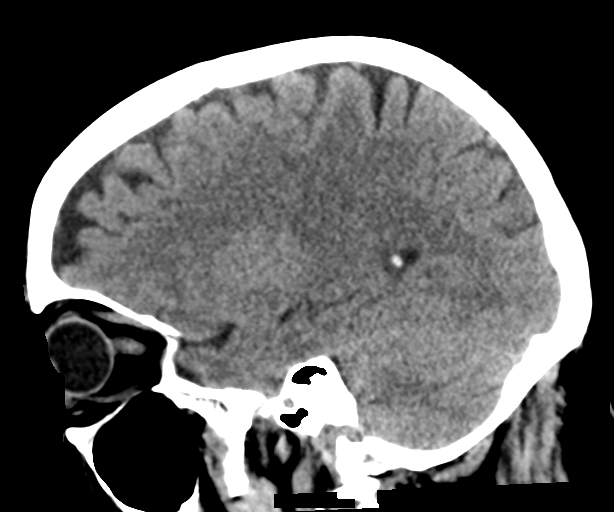

[Series 9: axial recon · axial · 0.23mm/px · z∈[-267,-137]mm · 8 of 95 slices shown, 10 images]
[im 11/95  brain]
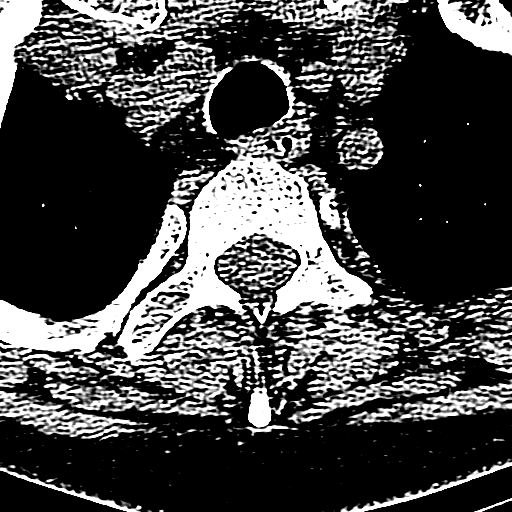
[im 11/95  bone]
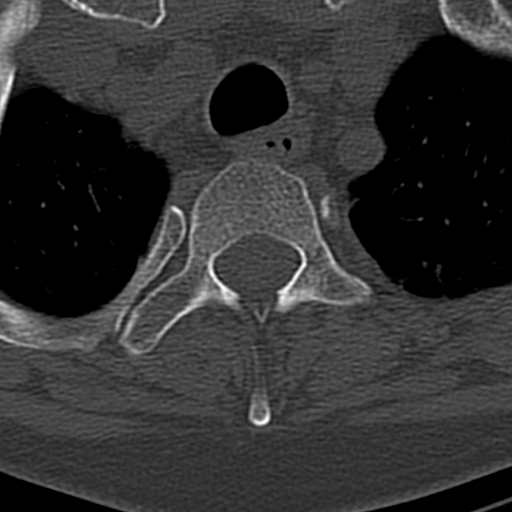
[im 21/95  brain]
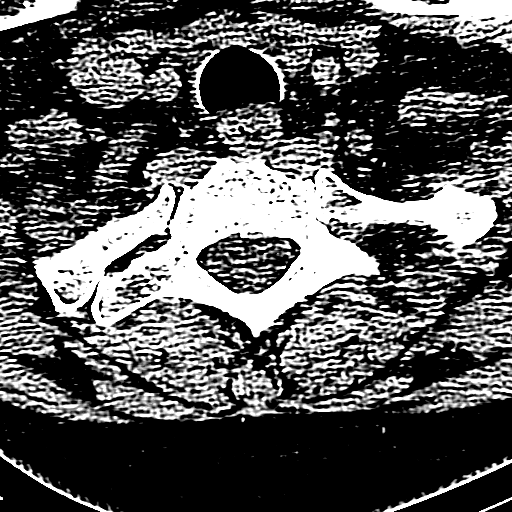
[im 32/95  brain]
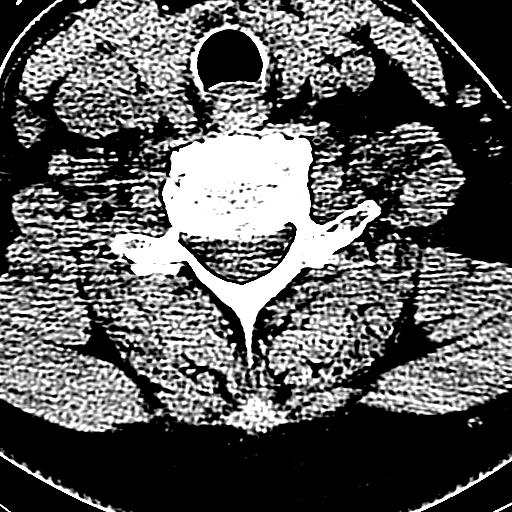
[im 42/95  brain]
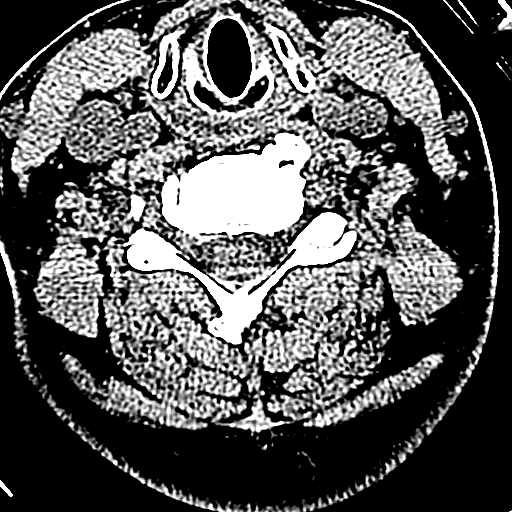
[im 53/95  brain]
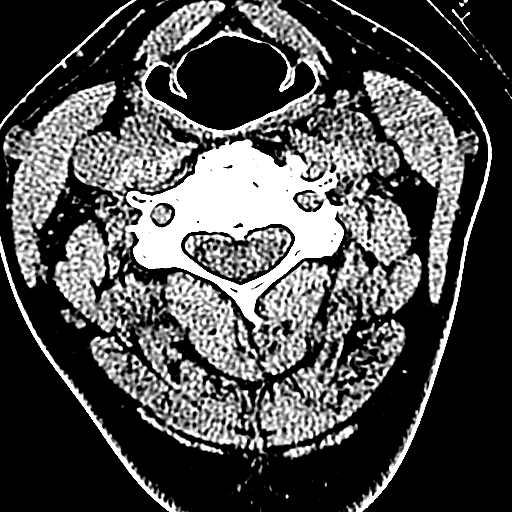
[im 53/95  bone]
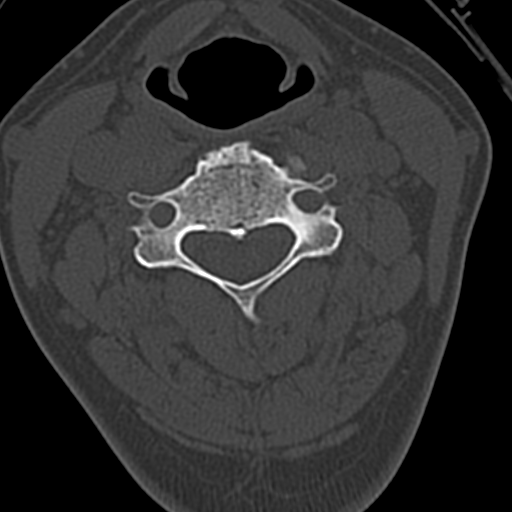
[im 63/95  brain]
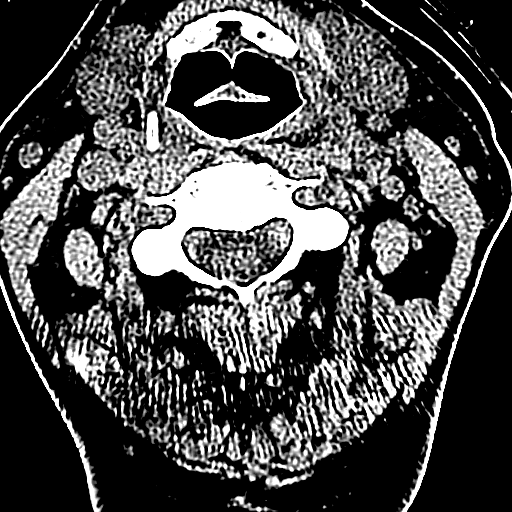
[im 74/95  brain]
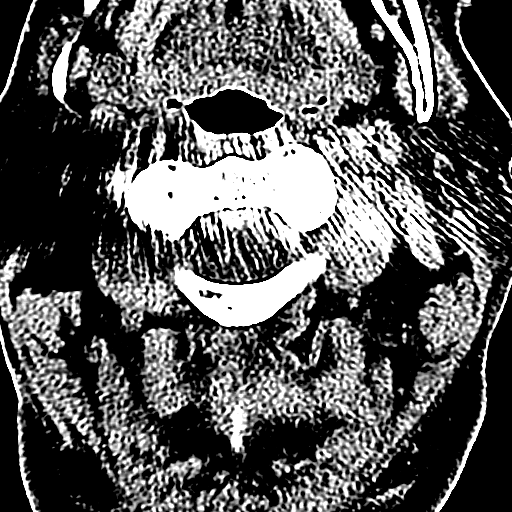
[im 84/95  brain]
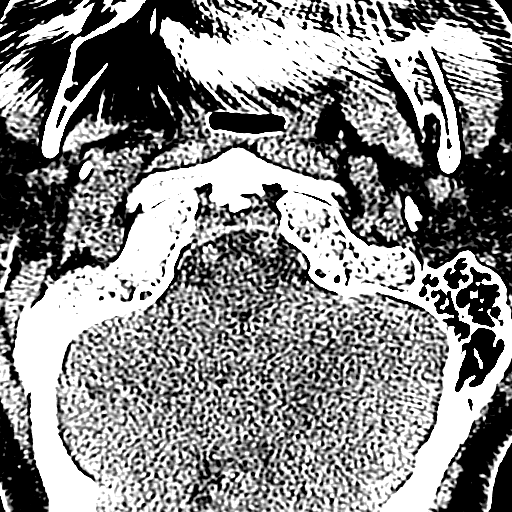

[17 of 47 positions shown; findings below may reference images not displayed]

FINDINGS: CT HEAD FINDINGS

Brain: No evidence of acute infarction, hemorrhage, hydrocephalus,
extra-axial collection or mass lesion/mass effect. Gray matter and
white matter are unremarkable, with normal differentiation. Brain
volume is normal for age.

Vascular: No hyperdense vessel or unexpected calcification.

Skull: Normal. Negative for fracture or focal lesion.

Sinuses/Orbits: No acute finding.

CT CERVICAL SPINE FINDINGS

Alignment: The cervical vertebrae are normal in height and
alignment.

Skull base and vertebrae: No acute fracture. No primary bone lesion
or focal pathologic process.

Soft tissues and spinal canal: No prevertebral fluid or swelling. No
visible canal hematoma.

Disc levels: Moderately severe degenerative cervical disc disease
from C3 through C7.

Upper chest: No significant abnormality.
IMPRESSION: 1. Normal brain.
2. Negative for acute cervical spine fracture. Moderately severe
cervical degenerative disc disease.

## 2018-06-30 ENCOUNTER — Ambulatory Visit: Admit: 2018-06-30 | Payer: PRIVATE HEALTH INSURANCE | Attending: Family

## 2018-06-30 DIAGNOSIS — G43719 Chronic migraine without aura, intractable, without status migrainosus: Secondary | ICD-10-CM

## 2018-06-30 MED ORDER — sodium chloride 0.9%
INTRAMUSCULAR | Status: AC
Start: 2018-06-30 — End: 2018-06-30

## 2018-06-30 MED ORDER — botulinum toxin A (BOTOX) 100 unit injection
100 | INTRAMUSCULAR | Status: AC
Start: 2018-06-30 — End: 2018-06-30

## 2018-06-30 MED ORDER — botulinum toxin A (BOTOX) injection 200 Units
100 | Freq: Once | INTRAMUSCULAR | Status: AC
Start: 2018-06-30 — End: 2018-06-30
  Administered 2018-06-30: 18:00:00 200 [IU] via INTRAMUSCULAR

## 2018-06-30 MED FILL — BOTOX 100 UNIT INJECTION: 100 100 unit | INTRAMUSCULAR | Qty: 2

## 2018-06-30 MED FILL — SODIUM CHLORIDE 0.9 % INJECTION SOLUTION: INTRAMUSCULAR | Qty: 10

## 2018-06-30 NOTE — Unmapped (Signed)
MRN:  [id number]  Date: 06/30/2018   Ople Girgis is a 58 y.o. female who comes in for scheduled Botox injection.      BOTOX PREEMPT PROTOCOL     The risks, benefits and anticipated outcomes of the procedure, the risks and benefits of the alternatives to the procedure, and the roles and tasks of the personnel to be involved, were discussed with the patient.     The side effects of onabotulinum toxin used for the treatment of chronic migraine have been described to the patient in detail.  These include: ptosis, neck pain, neck weakness, anaphylaxis, cellulitis, systemic spread (death, respiratory failure, dysarthria, dysphagia, diplopia), blood born illnesses (HIV, hepatitis, mad cow disease).   The patient is aware that any side effects related to muscle paralysis could last for 3 months.     The patient has given written informed consent to the procedure and agrees to proceed.Yes     Treatment # 5   Dilution: 5 units/0.1 ml (200 U of botox was mixed with 4 ccs of bacteriostatic saline.)  Indication: Chronic Migraine        Injection Sites:  0.1 ccs of this solution was injected into the following:  Muscle    Fixed Site/Fixed Dose  L      R   Corrugator??????????????????????????????????????10 Units divided in 2 sites??????????????????????????????????????????  ??  Procerus ??????????????????????????????????????????5 Units in 1 site ????????????????????????????????????????????????????????????????????????  ??  Frontalis????????????????????????????????????????????20 Units divided in 4 sites????????????????????????????????????????????  ??  Temporalis ??????????????????????????????????40 Units divided in 8 sites ????????????????????????????????????????  ??  Occipitalis ??????????????????????????????????????30 Units divided in 6 sites ????????????????????????????????????????  ??  Cervical PSPs??????????????????????????20 Units divided in 4 sites??????????????????????????????????????????  ??  Trapezius????????????????????????????????????????30 Units divided in 6 sites???????????????????????????????????? 22.5 units extra left trapezius in 5 divided doses  ???????????????????????????????????????????????????????????????????????????????????????????????????????????????????????????????????????????????????????????????????????????????????????????? ??????????22.5 units extra right trapezius in 5 divided  doses?????????????????????????????????????????????????????????????????????????????????????????????????????????????????????????????????????????????????????????????????????????? ??????  ??  ??  Total Units used:200??  Total Units wasted:0??  Patient??did??tolerate procedure.   ??  Change in Treatment Plan?no    RTC: 3 Months, For Botox and PRN    Stefan Church, CNP

## 2018-07-04 NOTE — Unmapped (Signed)
Received paperwork from [facility] regarding, PO date 05/19/2018. Paperwork will be placed in MD's box for completion and signing, will also need an attending to co-sign.

## 2018-07-04 NOTE — Unmapped (Signed)
Per MD sent paperwork back and re send to Dr Blake Divine

## 2018-07-04 NOTE — Unmapped (Signed)
Pt returning a missed call from [facility], please call pt back

## 2018-07-06 ENCOUNTER — Ambulatory Visit: Admit: 2018-07-12 | Discharge: 2018-07-13 | Payer: PRIVATE HEALTH INSURANCE | Attending: Psychiatry

## 2018-07-06 DIAGNOSIS — F419 Anxiety disorder, unspecified: Secondary | ICD-10-CM

## 2018-07-06 MED ORDER — busPIRone (BUSPAR) 10 MG tablet
10 | ORAL_TABLET | Freq: Three times a day (TID) | ORAL | 2 refills | Status: AC
Start: 2018-07-06 — End: 2018-09-14

## 2018-07-06 MED ORDER — hydrOXYzine HCL (ATARAX) 25 MG tablet
25 | ORAL_TABLET | Freq: Three times a day (TID) | ORAL | 2 refills | Status: AC | PRN
Start: 2018-07-06 — End: 2018-09-14

## 2018-07-06 NOTE — Unmapped (Signed)
Visit Start Time: 12:50 PM   Visit Quit Time: 1:10 PM      Context:??somatic symptoms  Location: Altered mental status of anxiety  Duration: chronic  Severity:??mild  Associated Symptoms: anxiety  Modifying Factors:improving    Service provided via telemedicine (video + audio) today due to need for limiting face to face contact as means of managing the spread of Covid19.        History of Present Illness:   Talulah Schirmer is a 58 y.o. female with PMH unspecified anxiety disorder, atrial fibrillation, WPW, idiopathic mast cell activation, anemia, asthma, hypothyroidism, GERD, intermittent chronic HA, PNES, presents for follow-up.     HPI     Patient has been doing well since her last appointment. Has home health who comes. Patient getting work done around the house. Had botox on Friday. Is seeing PCP. Patient's anxiety is steady, has occasional episodes which feel like hot flashes. Patient has had metanephrine levels checked in the past, wnl. Other that these episodes, is doing quite well. Actually prefers quarantine and staying at home, states that is her normal. Has been meditating, utilizing behavioral techniques that we talked about at last appointment. Definitely feels that it helps. Denies SI/HI/AVH. Explained to patient that this would be my last appointment with her. She expressed understanding.         Patient History:     Psychiatric History:     Prior Inpatient Psychiatric Hospitalization(s): denies  Prior Psychotropic Medications: reports taking prior psychotropic medications.  Current Psychotropic Medications: yes - see below current psychotropic medications.  SA: denies    Past Medical History:   Diagnosis Date   ??? Anaphylaxis    ??? Anemia    ??? Anxiety    ??? Asthma    ??? Atrial fibrillation (CMS Dx)    ??? Bronchitis    ??? Heart disease    ??? Neurological disease    ??? OSA (obstructive sleep apnea)     states resolved   ??? Osteoporosis    ??? Psychogenic nonepileptic seizure    ??? Seizures (CMS Dx)    ???  Spontaneous pneumothorax     states pneumomediatsium/pneumopericardium, self resolved   ??? Thyroid disease    ??? Ulcer    ??? WPW (Wolff-Parkinson-White syndrome)        Past Surgical History:   Procedure Laterality Date   ??? BONE MARROW BIOPSY      benign   ??? CARDIAC ELECTROPHYSIOLOGY MAPPING AND ABLATION      attempted x 2, states not successful either time   ??? CHOLECYSTECTOMY  2015   ??? DENTAL EXTRACTION Right 04/25/2017    Procedure: EXTRACTION TOOTH # 31, BRIDGE RESECTIONING;  Surgeon: Gladys Damme, DMD;  Location: [facility] OR;  Service: Oral Surgery;  Laterality: Right;   ??? FACIAL COSMETIC SURGERY     ??? HYSTERECTOMY  2009   ??? INSERTION CATHETER VASCULAR ACCESS N/A 09/06/2017    Procedure: INSERTION POWER PORT A CATH;  Surgeon: Penni Bombard, MD;  Location: [facility] OR;  Service: General;  Laterality: N/A;   ??? LIVER BIOPSY      benign hepatic adednoma per patient   ??? REMOVAL HARDWARE LEG Left 08/17/2016    Procedure: REMOVAL OF HARDWARE LEFT TIBIA;  Surgeon: Andee Lineman, MD;  Location: [facility] OR;  Service: Orthopedics;  Laterality: Left;   ??? TIBIA FRACTURE SURGERY     ??? TONSILLECTOMY      + adenoids       Social History  Socioeconomic History   ??? Marital status: Divorced     Spouse name: Not on file   ??? Number of children: Not on file   ??? Years of education: Not on file   ??? Highest education level: Not on file   Occupational History   ??? Not on file   Social Needs   ??? Financial resource strain: Not on file   ??? Food insecurity     Worry: Not on file     Inability: Not on file   ??? Transportation needs     Medical: Not on file     Non-medical: Not on file   Tobacco Use   ??? Smoking status: Never Smoker   ??? Smokeless tobacco: Never Used   ??? Tobacco comment: Denies (11/15/2016)   Substance and Sexual Activity   ??? Alcohol use: No     Comment: Denies (11/15/2016)   ??? Drug use: No     Comment: Denies (11/15/2016)   ??? Sexual activity: Not Currently   Lifestyle   ??? Physical activity     Days per week: Not on file     Minutes per session:  Not on file   ??? Stress: Not on file   Relationships   ??? Social Wellsite geologist on phone: Not on file     Gets together: Not on file     Attends religious service: Not on file     Active member of club or organization: Not on file     Attends meetings of clubs or organizations: Not on file     Relationship status: Not on file   ??? Intimate partner violence     Fear of current or ex partner: Not on file     Emotionally abused: Not on file     Physically abused: Not on file     Forced sexual activity: Not on file   Other Topics Concern   ??? Caffeine Use Yes   ??? Occupational Exposure Not Asked   ??? Exercise No   ??? Seat Belt Yes   Social History Narrative   ??? Not on file       Family History   Problem Relation Age of Onset   ??? Kidney failure Mother    ??? Aneurysm Mother        Review of Systems    Denies CP, SOB, nausea, vomiting    Allergies:   Acetaminophen; Adhesive tape-silicones; Alcohol; Amitriptyline; Aspirin; Benzoic acid; Blue dye; Epinephrine; Flecainide; Gabapentin; Iodinated contrast media; Isopropyl alcohol; Latex; Levofloxacin; Lidocaine; Nsaids (non-steroidal anti-inflammatory drug); Onion; Other; Paraben; Perfume; Pregabalin; Preservative; Quetiapine; Red dye; Sulfa (sulfonamide antibiotics); Sulfur; Trazodone; Verapamil; Yellow dye; Green dye; Onion extract; Doxepin; Duloxetine; Epi e-z pen; Hydroxyzine; Montelukast; Statins-hmg-coa reductase inhibitors; and Verapamil (bulk)    Medications:     Outpatient Encounter Medications as of 07/06/2018   Medication Sig Dispense Refill   ??? AMOXicillin-clavulanate (AUGMENTIN) 500-125 mg per tablet Take 1 tablet by mouth 3 times a day. 30 tablet 0   ??? blood-glucose meter Misc Use meter to test blood glucose twice daily 1 each 0   ??? busPIRone (BUSPAR) 10 MG tablet Take 2 tablets (20 mg total) by mouth 3 times a day. 180 tablet 2   ??? cetirizine (ZYRTEC) 10 mg Cap      ??? cromolyn (GASTROCROM) 100 mg/5 mL solution TAKE BY MOUTH 4 TIMES DAILY: BEFORE MEALS & AT  BEDTIME 480 mL 0   ??? cyanocobalamin, vitamin  B-12, (VITAMIN B-12 INJ) Inject 0.5 mLs as directed.     ??? diphenhydrAMINE (BENADRYL) 25 mg capsule Take 75-150 mg by mouth daily.     ??? doxepin (SINEQUAN) 10 MG capsule TAKE ONE (1) CAPSULE BY MOUTH AT BEDTIME. 30 capsule 11   ??? famotidine (PEPCID) 40 MG tablet Take 40 mg by mouth 2 times a day.     ??? fexofenadine (ALLEGRA) 180 MG tablet Take 1 tablet (180 mg total) by mouth daily. 30 tablet 3   ??? furosemide (LASIX) 20 MG tablet Take 1 tablet (20 mg total) by mouth daily. 90 tablet 3   ??? hydrOXYzine HCL (ATARAX) 25 MG tablet Take 1 tablet (25 mg total) by mouth every 8 hours as needed for Itching. 90 tablet 2   ??? lancets Misc USE TO TEST BLOOD SUGAR 2 TIMES DAILY- Easy touch twist 30g lancets 100 each 0   ??? levalbuterol (XOPENEX) 1.25 mg/3 mL nebulizer solution Inhale 3 mL (1.25 mg total) by nebulization every 6 hours as needed for Wheezing. 72 mL 0   ??? levothyroxine (SYNTHROID, LEVOTHROID) 137 MCG tablet Take 1 tablet (137 mcg total) by mouth daily. 30 tablet 11   ??? metFORMIN (GLUCOPHAGE) 500 MG tablet TAKE ONE TABLET BY MOUTH TWICE DAILY WITH MEALS 60 tablet 11   ??? montelukast (SINGULAIR) 10 mg tablet TAKE ONE TABLET BY MOUTH AT BEDTIME. 30 tablet 11   ??? nebulizer and compressor Devi Use 1 ampule as directed every 4 hours. Indications: Please dispense 1 nebulizer unit 1 each 0   ??? onabotulinumtoxinA (BOTOX COSMETIC) 50 unit SolR Inject into the muscle.     ??? predniSONE (DELTASONE) 10 MG tablet Take 1 tablet (10 mg total) by mouth daily. 60 tablet 1   ??? predniSONE (DELTASONE) 20 MG tablet TAKE ONE TABLET BY MOUTH DAILY. 30 tablet 0   ??? ranitidine (ZANTAC) 300 MG tablet Take 1 tablet (300 mg total) by mouth 3 times a day. 90 tablet 11   ??? Ringer's solution,lactated (LACTATED RINGERS) infusion Intravenous 1,000 mLs once.     ??? triamcinolone (KENALOG) 0.1 % ointment APPLY 2 TIMES DAILY 80 g 11   ??? TRUE METRIX GLUCOSE TEST STRIP Strp USE TO TEST BLOOD SUGAR 2 TIMES DAILY 50  strip 11   ??? UNABLE TO FIND COMPOUNDED METHYLCOBALAMIN 1000 MCG EVERY OTHER DAY BY INJECTION/ TRISTATE 1000 mL 11   ??? VITAMIN D3-VITAMIN K2, MK4, ORAL Take by mouth.       No facility-administered encounter medications on file as of 07/06/2018.         Outpatient Medications Prior to Visit   Medication Sig Dispense Refill   ??? AMOXicillin-clavulanate (AUGMENTIN) 500-125 mg per tablet Take 1 tablet by mouth 3 times a day. 30 tablet 0   ??? blood-glucose meter Misc Use meter to test blood glucose twice daily 1 each 0   ??? busPIRone (BUSPAR) 10 MG tablet Take 2 tablets (20 mg total) by mouth 3 times a day. 180 tablet 2   ??? cetirizine (ZYRTEC) 10 mg Cap      ??? cromolyn (GASTROCROM) 100 mg/5 mL solution TAKE BY MOUTH 4 TIMES DAILY: BEFORE MEALS & AT BEDTIME 480 mL 0   ??? cyanocobalamin, vitamin B-12, (VITAMIN B-12 INJ) Inject 0.5 mLs as directed.     ??? diphenhydrAMINE (BENADRYL) 25 mg capsule Take 75-150 mg by mouth daily.     ??? doxepin (SINEQUAN) 10 MG capsule TAKE ONE (1) CAPSULE BY MOUTH AT BEDTIME. 30 capsule 11   ???  famotidine (PEPCID) 40 MG tablet Take 40 mg by mouth 2 times a day.     ??? fexofenadine (ALLEGRA) 180 MG tablet Take 1 tablet (180 mg total) by mouth daily. 30 tablet 3   ??? furosemide (LASIX) 20 MG tablet Take 1 tablet (20 mg total) by mouth daily. 90 tablet 3   ??? hydrOXYzine HCL (ATARAX) 25 MG tablet Take 1 tablet (25 mg total) by mouth every 8 hours as needed for Itching. 90 tablet 2   ??? lancets Misc USE TO TEST BLOOD SUGAR 2 TIMES DAILY- Easy touch twist 30g lancets 100 each 0   ??? levalbuterol (XOPENEX) 1.25 mg/3 mL nebulizer solution Inhale 3 mL (1.25 mg total) by nebulization every 6 hours as needed for Wheezing. 72 mL 0   ??? levothyroxine (SYNTHROID, LEVOTHROID) 137 MCG tablet Take 1 tablet (137 mcg total) by mouth daily. 30 tablet 11   ??? metFORMIN (GLUCOPHAGE) 500 MG tablet TAKE ONE TABLET BY MOUTH TWICE DAILY WITH MEALS 60 tablet 11   ??? montelukast (SINGULAIR) 10 mg tablet TAKE ONE TABLET BY MOUTH AT  BEDTIME. 30 tablet 11   ??? nebulizer and compressor Devi Use 1 ampule as directed every 4 hours. Indications: Please dispense 1 nebulizer unit 1 each 0   ??? onabotulinumtoxinA (BOTOX COSMETIC) 50 unit SolR Inject into the muscle.     ??? predniSONE (DELTASONE) 10 MG tablet Take 1 tablet (10 mg total) by mouth daily. 60 tablet 1   ??? predniSONE (DELTASONE) 20 MG tablet TAKE ONE TABLET BY MOUTH DAILY. 30 tablet 0   ??? ranitidine (ZANTAC) 300 MG tablet Take 1 tablet (300 mg total) by mouth 3 times a day. 90 tablet 11   ??? Ringer's solution,lactated (LACTATED RINGERS) infusion Intravenous 1,000 mLs once.     ??? triamcinolone (KENALOG) 0.1 % ointment APPLY 2 TIMES DAILY 80 g 11   ??? TRUE METRIX GLUCOSE TEST STRIP Strp USE TO TEST BLOOD SUGAR 2 TIMES DAILY 50 strip 11   ??? UNABLE TO FIND COMPOUNDED METHYLCOBALAMIN 1000 MCG EVERY OTHER DAY BY INJECTION/ TRISTATE 1000 mL 11   ??? VITAMIN D3-VITAMIN K2, MK4, ORAL Take by mouth.       No facility-administered medications prior to visit.        Objective:   Physical Exam  There were no vitals filed for this visit.    Gait and Stations: Normal    Mental Status Exam:   Appearance: UTA over phone  Behavior/Movement: polite, cooperative.   Speech: regular rate, rhythm, tone and prosody.  Naming and repeating intact.  No aphasia. Production within normal limit. Fluent and nonpressured.  Mood: fine  Affect: euthymic, full range, mood congruent, content appropriate   Thought Process: organized and linear without any flight of ideas or loosening of associations.  Thought Content: Denies suicidal ideation, denies homicidal ideation. No ruminations or obsessions, no erotomanic, grandiose, persecutory, or bizarre delusions were noted.   Perceptions: Denies auditory hallucinations, denies visual hallucinations. Not seen responding to internal stimuli on camera.   Cognition:   -Sensorium: alert   -Orientation: oriented to person, place, situation and year   -Memory: grossly intact    -Fund Of  Knowledge: appears consistent with average intellect based on vocabulary,    -Insight: good    -Judgement: good    PHQ-9 Scores:   PHQ Total Score 03/30/2016 05/21/2016 06/24/2016 08/26/2016 09/29/2017   PHQ-9 Total Score 0 0 0 9 7       GAD7 Scores:   GAD7Total  Score 08/26/2016 11/25/2016 02/03/2017 04/14/2017 09/29/2017   GAD-7 Total Score 10 6 2 6 4           Assessment/Plan:     Diagnoses:  -Somatic symptom disorder  -Unspecified anxiety disorder, mild  -Cluster B personality traits (particularly histrionic)      Her anxiety has significantly improved on the current medication regimen and with treatment through integrative medicine.  Will continue current medication regimen given beneficial effects. Patient's episodes of hot flashes do not sound completely consistent with anxiety attacks. May be actual hot flashes or related to her thyroid. Has had urine metanephrine levels checked in the past, they are normal. Follows with allergy/immunology as well as functional medicine. Overall doing well from a psychiatric standpoint.      # Anxiety, mild, significantly improved:  -Continue Buspirone 20 mg TID for anxiety  -Hydroxyzine 25 mg BID for anxiety + additional dose PRN. Patient agreeable to this plan.  -Continue treatment with integrative medicine for biofeedback, massage therapy and acupuncture  -Self-management: meditation, yoga  -RTC in 8 weeks w/ new resident  ??  Discussed risks/benefits/side effects/alternatives to medications. Discussed the risk of cognitive decline with long-term use of anti-histamine. Patient expressed understanding of and agreement to the current treatment plan.   ??  # Safety: The patient's imminent risk appears low, as she denies SI, does not express HI, no previous SA, future-oriented and goal-oriented to continue treatment. Patient expressed understanding of resources, including 911 and PES.  ??  # Substance:  -No active issues  ??  # Medical:  -Follow-up with PCP      Olga Millers, MD  ??  Patient plan  discussed and agreed upon by attending Dr. Thomasena Edis

## 2018-07-13 ENCOUNTER — Ambulatory Visit: Admit: 2018-07-13 | Payer: PRIVATE HEALTH INSURANCE

## 2018-07-13 DIAGNOSIS — D4709 Other mast cell neoplasms of uncertain behavior: Secondary | ICD-10-CM

## 2018-07-13 NOTE — Unmapped (Signed)
I saw and examined the patient on 07/13/18.  I discussed with the resident or fellow and agree with Christena Flake, DO's findings and plan as documented in the note.    HPI: 58 y.o. female mild asthma, mast cell disorder here for follow-up visit  Pt states asthma is well controlled.  No Albuterol usage.  No exacerbation for many years.    Pt is currently on Doxepin, Allegra, Cetirizine, Pepcid, Singulair for her mast cell disorder and feels that it controls her sxs.  No recent flares. Pt has Hydroxyzine prn and titrates her Doxepin.    I also reviewed the patient's medications, PMHX, Personal/Social/Environmental/Occupational    Past Medical History:   Diagnosis Date   ??? Anaphylaxis    ??? Anemia    ??? Anxiety    ??? Asthma    ??? Atrial fibrillation (CMS Dx)    ??? Bronchitis    ??? Heart disease    ??? Neurological disease    ??? OSA (obstructive sleep apnea)     states resolved   ??? Osteoporosis    ??? Psychogenic nonepileptic seizure    ??? Seizures (CMS Dx)    ??? Spontaneous pneumothorax     states pneumomediatsium/pneumopericardium, self resolved   ??? Thyroid disease    ??? Ulcer    ??? WPW (Wolff-Parkinson-White syndrome)        PSH:  Past Surgical History:   Procedure Laterality Date   ??? BONE MARROW BIOPSY      benign   ??? CARDIAC ELECTROPHYSIOLOGY MAPPING AND ABLATION      attempted x 2, states not successful either time   ??? CHOLECYSTECTOMY  2015   ??? DENTAL EXTRACTION Right 04/25/2017    Procedure: EXTRACTION TOOTH # 31, BRIDGE RESECTIONING;  Surgeon: Gladys Damme, DMD;  Location: [facility] OR;  Service: Oral Surgery;  Laterality: Right;   ??? FACIAL COSMETIC SURGERY     ??? HYSTERECTOMY  2009   ??? INSERTION CATHETER VASCULAR ACCESS N/A 09/06/2017    Procedure: INSERTION POWER PORT A CATH;  Surgeon: Penni Bombard, MD;  Location: [facility] OR;  Service: General;  Laterality: N/A;   ??? LIVER BIOPSY      benign hepatic adednoma per patient   ??? REMOVAL HARDWARE LEG Left 08/17/2016    Procedure: REMOVAL OF HARDWARE LEFT TIBIA;  Surgeon: Andee Lineman, MD;   Location: [facility] OR;  Service: Orthopedics;  Laterality: Left;   ??? TIBIA FRACTURE SURGERY     ??? TONSILLECTOMY      + adenoids       Allergies:  Allergies   Allergen Reactions   ??? Acetaminophen Anaphylaxis     Unknown   ??? Adhesive Tape-Silicones Itching, Rash, Swelling and Anaphylaxis     Unknown   ??? Alcohol Anaphylaxis   ??? Amitriptyline Anaphylaxis   ??? Aspirin Anaphylaxis     Unknown   ??? Benzoic Acid Anaphylaxis   ??? Blue Dye Anaphylaxis     Unknown   ??? Epinephrine Anaphylaxis   ??? Flecainide Shortness Of Breath and Anaphylaxis     Itching, decreased libido, wheezing, flushing, numbness.    ??? Gabapentin Anaphylaxis   ??? Iodinated Contrast Media Anaphylaxis   ??? Isopropyl Alcohol Anaphylaxis   ??? Latex Anaphylaxis and Rash   ??? Levofloxacin Itching and Anaphylaxis   ??? Lidocaine Anaphylaxis   ??? Nsaids (Non-Steroidal Anti-Inflammatory Drug) Anaphylaxis   ??? Onion Anaphylaxis   ??? Other Anaphylaxis     HAS MAST CELL ACTIVATION SYNDROME, SO PATIENT HAS ANAPHYLACTIC REACTIONS  IF BODY RECOGNIZES TRIGGERS  CANNOT TOLERATE ANY DYES, PRESERVATIVES, PARABENS, ETC.  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   ??? Paraben Anaphylaxis   ??? Perfume Anaphylaxis   ??? Pregabalin Anaphylaxis   ??? Preservative Anaphylaxis   ??? Quetiapine Anaphylaxis     Mental Status Change  Other reaction(s): Angioedema   ??? Red Dye Anaphylaxis     Unknown   ??? Sulfa (Sulfonamide Antibiotics) Anaphylaxis   ??? Sulfur Anaphylaxis     Pt states: Any alcohol product   ??? Trazodone Rash and Anaphylaxis   ??? Verapamil Anaphylaxis   ??? Yellow Dye Anaphylaxis     Unknown   ??? Green Dye Rash     Other reaction(s): Contact Dermatitis   ??? Onion Extract Other (See Comments)   ??? Doxepin Other (See Comments)     Unknown   ??? Duloxetine      Other reaction(s): Other (See Comments)  Mental Status Change   ??? Epi E-Z Pen    ??? Hydroxyzine Itching     Unknown   ??? Montelukast    ???  Statins-Hmg-Coa Reductase Inhibitors    ??? Verapamil (Bulk) Hives     Insomnia, flushing, scattered brain, migraine, itching, anxiety.        Medications:  Current Outpatient Medications on File Prior to Visit   Medication Sig Dispense Refill   ??? AMOXicillin-clavulanate (AUGMENTIN) 500-125 mg per tablet Take 1 tablet by mouth 3 times a day. 30 tablet 0   ??? blood-glucose meter Misc Use meter to test blood glucose twice daily 1 each 0   ??? busPIRone (BUSPAR) 10 MG tablet Take 2 tablets (20 mg total) by mouth 3 times a day. 180 tablet 2   ??? cetirizine (ZYRTEC) 10 mg Cap      ??? cromolyn (GASTROCROM) 100 mg/5 mL solution TAKE BY MOUTH 4 TIMES DAILY: BEFORE MEALS & AT BEDTIME 480 mL 0   ??? cyanocobalamin, vitamin B-12, (VITAMIN B-12 INJ) Inject 0.5 mLs as directed.     ??? diphenhydrAMINE (BENADRYL) 25 mg capsule Take 75-150 mg by mouth daily.     ??? doxepin (SINEQUAN) 10 MG capsule TAKE ONE (1) CAPSULE BY MOUTH AT BEDTIME. 30 capsule 11   ??? famotidine (PEPCID) 40 MG tablet Take 40 mg by mouth 2 times a day.     ??? fexofenadine (ALLEGRA) 180 MG tablet Take 1 tablet (180 mg total) by mouth daily. 30 tablet 3   ??? furosemide (LASIX) 20 MG tablet Take 1 tablet (20 mg total) by mouth daily. 90 tablet 3   ??? hydrOXYzine HCL (ATARAX) 25 MG tablet Take 1 tablet (25 mg total) by mouth every 8 hours as needed for Itching. 90 tablet 2   ??? lancets Misc USE TO TEST BLOOD SUGAR 2 TIMES DAILY- Easy touch twist 30g lancets 100 each 0   ??? levalbuterol (XOPENEX) 1.25 mg/3 mL nebulizer solution Inhale 3 mL (1.25 mg total) by nebulization every 6 hours as needed for Wheezing. 72 mL 0   ??? levothyroxine (SYNTHROID, LEVOTHROID) 137 MCG tablet Take 1 tablet (137 mcg total) by mouth daily. 30 tablet 11   ??? metFORMIN (GLUCOPHAGE) 500 MG tablet TAKE ONE TABLET BY MOUTH TWICE DAILY WITH MEALS 60 tablet 11   ??? montelukast (SINGULAIR) 10 mg tablet TAKE ONE TABLET BY MOUTH AT BEDTIME. 30 tablet 11   ??? nebulizer and compressor Devi Use 1 ampule as  directed  every 4 hours. Indications: Please dispense 1 nebulizer unit 1 each 0   ??? onabotulinumtoxinA (BOTOX COSMETIC) 50 unit SolR Inject into the muscle.     ??? predniSONE (DELTASONE) 10 MG tablet Take 1 tablet (10 mg total) by mouth daily. 60 tablet 1   ??? predniSONE (DELTASONE) 20 MG tablet TAKE ONE TABLET BY MOUTH DAILY. 30 tablet 0   ??? ranitidine (ZANTAC) 300 MG tablet Take 1 tablet (300 mg total) by mouth 3 times a day. 90 tablet 11   ??? Ringer's solution,lactated (LACTATED RINGERS) infusion Intravenous 1,000 mLs once.     ??? triamcinolone (KENALOG) 0.1 % ointment APPLY 2 TIMES DAILY 80 g 11   ??? TRUE METRIX GLUCOSE TEST STRIP Strp USE TO TEST BLOOD SUGAR 2 TIMES DAILY 50 strip 11   ??? UNABLE TO FIND COMPOUNDED METHYLCOBALAMIN 1000 MCG EVERY OTHER DAY BY INJECTION/ TRISTATE 1000 mL 11   ??? VITAMIN D3-VITAMIN K2, MK4, ORAL Take by mouth.       No current facility-administered medications on file prior to visit.          Physical Exam:   Vitals:    07/13/18 0927   BP: 135/77   Pulse: 79   SpO2: 96%       Agree with PE findings    A&P: Agree with a/p as discussed with the patient and Dr. Nicholes Stairs.    Flora Lipps, MD

## 2018-07-13 NOTE — Unmapped (Signed)
ALLERGY & IMMUNOLOGY Clinic Note     CC: Follow up for asthma and other issues    HPI: Emily Bonilla is a 58 y.o. female with history of mild intermittent asthma and reported mast cell disorder who presents to the allergy clinic today for a follow up visit, accompanied by her personal aide.     Since last visit, she has been doing well. She has been staying at home due to COVID. She has been having worsening reflux over the past few weeks. She is taking Famotidine 20 mg BID for her reflux. She has had more muscle tension than usual. She has not had any issues with her breathing or nasal symptoms. She is not using any inhalers.    Reported Mast Cell Disorder:  We were not able to get any laboratory confirmation or previous records to support this diagnosis. She reports nearly resolution of her symptoms. She denies having hives, flushing, itching, respiratory symptoms and GI symptoms. She reports seeing Dr. Hulen Skains about 20 years (1996) and was told that she has mast cell disorder. She is currently taking Doxepin 10 mg daily, Allegra 180 mg at bedtime, Zyrtec 10 mg daily in AM, Singulair 10 mg at bedtime, Hydraxyzine 25 mg TID, which control her symptoms well.    Mild Intermittent Asthma:  She has not used any inhalers for over 2 years now. She denies any symptoms of wheezing, coughing, nighttime symptoms. No recent steroid use, visits to ED or UC.    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 Used to get IVIG in the past but not in the recent years. Has not had any recent infections and her recent IgG levels have been normal:          Review of Systems:  GENERAL: denies unintended weight changes, fever or fatigue   EYES: denies changes in vision, watery or itchy eyes   ENT: denies changes in hearing or dental problems   CARDIOVASCULAR: denies chest pain, palpitations or edema  LUNGS: denies shortness of breath, wheezing, cough or  difficulty breathing   ABDOMEN: denies abdominal pain, nausea, vomiting or diarrhea  GU: denies dysuria or hematuria  HEME/ONC: denies abnormal bleeding or bruising   MSK: denies muscle weakness, joint stiffness or joint pain   ENDO: denies heat or cold intolerance  NEURO: denies seizures, headaches or focal neurological deficits  PSYCH: denies anxiety or depression   SKIN: denies rash and pruritus      Past Medical History:  Past Medical History:   Diagnosis Date   ??? Anaphylaxis    ??? Anemia    ??? Anxiety    ??? Asthma    ??? Atrial fibrillation (CMS Dx)    ??? Bronchitis    ??? Heart disease    ??? Neurological disease    ??? OSA (obstructive sleep apnea)     states resolved   ??? Osteoporosis    ??? Psychogenic nonepileptic seizure    ??? Seizures (CMS Dx)    ??? Spontaneous pneumothorax     states pneumomediatsium/pneumopericardium, self resolved   ??? Thyroid disease    ??? Ulcer    ??? WPW (Wolff-Parkinson-White syndrome)        Past Surgical History:  Past Surgical History:   Procedure Laterality Date   ??? BONE MARROW BIOPSY      benign   ??? CARDIAC ELECTROPHYSIOLOGY MAPPING AND ABLATION      attempted x 2,  states not successful either time   ??? CHOLECYSTECTOMY  2015   ??? DENTAL EXTRACTION Right 04/25/2017    Procedure: EXTRACTION TOOTH # 31, BRIDGE RESECTIONING;  Surgeon: Gladys Damme, DMD;  Location: [facility];  Service: Oral Surgery;  Laterality: Right;   ??? FACIAL COSMETIC SURGERY     ??? HYSTERECTOMY  2009   ??? INSERTION CATHETER VASCULAR ACCESS N/A 09/06/2017    Procedure: INSERTION POWER PORT A CATH;  Surgeon: Penni Bombard, MD;  Location: [facility] OR;  Service: General;  Laterality: N/A;   ??? LIVER BIOPSY      benign hepatic adednoma per patient   ??? REMOVAL HARDWARE LEG Left 08/17/2016    Procedure: REMOVAL OF HARDWARE LEFT TIBIA;  Surgeon: Andee Lineman, MD;  Location: [facility];  Service: Orthopedics;  Laterality: Left;   ??? TIBIA FRACTURE SURGERY     ??? TONSILLECTOMY      + adenoids       Social History:  Tobacco use: none  Alcohol use:  none    Allergies:  Allergies   Allergen Reactions   ??? Acetaminophen Anaphylaxis     Unknown   ??? Adhesive Tape-Silicones Itching, Rash, Swelling and Anaphylaxis     Unknown   ??? Alcohol Anaphylaxis   ??? Amitriptyline Anaphylaxis   ??? Aspirin Anaphylaxis     Unknown   ??? Benzoic Acid Anaphylaxis   ??? Blue Dye Anaphylaxis     Unknown   ??? Epinephrine Anaphylaxis   ??? Flecainide Shortness Of Breath and Anaphylaxis     Itching, decreased libido, wheezing, flushing, numbness.    ??? Gabapentin Anaphylaxis   ??? Iodinated Contrast Media Anaphylaxis   ??? Isopropyl Alcohol Anaphylaxis   ??? Latex Anaphylaxis and Rash   ??? Levofloxacin Itching and Anaphylaxis   ??? Lidocaine Anaphylaxis   ??? Nsaids (Non-Steroidal Anti-Inflammatory Drug) Anaphylaxis   ??? Onion Anaphylaxis   ??? 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   ??? Paraben Anaphylaxis   ??? Perfume Anaphylaxis   ??? Pregabalin Anaphylaxis   ??? Preservative Anaphylaxis   ??? Quetiapine Anaphylaxis     Mental Status Change  Other reaction(s): Angioedema   ??? Red Dye Anaphylaxis     Unknown   ??? Sulfa (Sulfonamide Antibiotics) Anaphylaxis   ??? Sulfur Anaphylaxis     Pt states: Any alcohol product   ??? Trazodone Rash and Anaphylaxis   ??? Verapamil Anaphylaxis   ??? Yellow Dye Anaphylaxis     Unknown   ??? Green Dye Rash     Other reaction(s): Contact Dermatitis   ??? Onion Extract Other (See Comments)   ??? Doxepin Other (See Comments)     Unknown   ??? Duloxetine      Other reaction(s): Other (See Comments)  Mental Status Change   ??? Epi E-Z Pen    ??? Hydroxyzine Itching     Unknown   ??? Montelukast    ??? Statins-Hmg-Coa Reductase Inhibitors    ??? Verapamil (Bulk) Hives     Insomnia, flushing, scattered brain, migraine,  itching, anxiety.        Physical Exam:   There were no vitals taken for this visit.  GENERAL APPEARANCE: Well developed, well nourished, in no acute distress, alert and oriented.  SKIN:  Inspection of the skin reveals no rashes, ulcerations or petechia. Healing scar on L lower leg from titanium removal.  HEENT: Normal turbinates, no evidence of swelling or rhinorrhea. No masses, polyps or pus. No septal perforation.  NECK: Supple and symmetric.  LUNGS: Auscultation of the lungs revealed normal breath sounds, no wheezes or crackles.  CARDIOVASCULAR: Regular rate and rhythm without any murmurs, rubs or gallops.  ABDOMEN: Soft and nontender with normal bowel sounds.  LYMPH NODES:  No lymphadenopathy.  EXTREMITIES:  No cyanosis, clubbing or edema.  NEUROLOGIC: No focal neurological deficits noted.    Assessment and Plan:    1. 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 Mild Intermittent Asthma: controlled  -- She is symptom free  -- Not using any inhalers now    3. 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 Vito Backers who is in agreement with the above plan.    Follow up: 4 months    Matilde Sprang, D.O.  Allergy and Immunology Fellow

## 2018-07-20 MED ORDER — levothyroxine (SYNTHROID) 137 MCG tablet
137 | ORAL_TABLET | ORAL | 0 refills | Status: AC
Start: 2018-07-20 — End: 2018-09-20

## 2018-07-20 MED ORDER — EASY TOUCH TWIST LANCETS 30 gauge Misc
30 | 11 refills | Status: AC
Start: 2018-07-20 — End: 2019-08-13

## 2018-07-21 NOTE — Unmapped (Signed)
Addended by: Court Joy on: 07/21/2018 10:31 AM     Modules accepted: Orders

## 2018-07-21 NOTE — Unmapped (Signed)
Left VM to call back at [phone], can speak to any nurse answering the phone. Please relay information if patient call backs. Please provide pt phone no to GI to schedule appt [phone].      Court Joy, MD  [facility] 22 minutes ago (10:31 AM)   Referral to GI in Epic. Please help patient to schedule appt. Thank you    Routing comment

## 2018-07-21 NOTE — Unmapped (Signed)
Patient calling to request referral to gastroenterology.  She states her GERDS is getting so bad that it is causing some vocal dysfunction again.  She can be reached at [phone].

## 2018-07-24 NOTE — Unmapped (Signed)
Called patient and she has an appointment made with GI.     Future Appointments   Date Time Provider Department Center   08/01/2018 12:30 PM Roxy Horseman, CNP [facility] GAST [facility]   09/18/2018 10:00 AM Doristine Church, CNP [facility] D[facility]M [facility]   09/22/2018 12:30 PM Stefan Church, CNP [facility] NEUR [facility]   10/19/2018  9:40 AM Desmond Dike, MD [facility]

## 2018-08-01 ENCOUNTER — Ambulatory Visit: Admit: 2018-08-01 | Payer: PRIVATE HEALTH INSURANCE | Attending: Gerontology

## 2018-08-01 ENCOUNTER — Ambulatory Visit: Admit: 2018-08-01 | Discharge: 2018-08-01 | Payer: PRIVATE HEALTH INSURANCE | Attending: Speech-Language Pathologist

## 2018-08-01 DIAGNOSIS — R490 Dysphonia: Secondary | ICD-10-CM

## 2018-08-01 DIAGNOSIS — K219 Gastro-esophageal reflux disease without esophagitis: Secondary | ICD-10-CM

## 2018-08-01 MED ORDER — omeprazole (PRILOSEC) 40 MG capsule
40 | ORAL_CAPSULE | Freq: Every morning | ORAL | 0 refills | Status: AC
Start: 2018-08-01 — End: 2018-10-20

## 2018-08-01 NOTE — Unmapped (Signed)
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  Background History:     Pt with complex medical history. 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 She underwent hypnoptherapy - can use this technique coupled with breathing exercises to eliminate VCD and MTD. Complains of severe inflammation and muscle tension throughout body - routinely gets botox injected base of skull and down spine for treatment. Pt has a handle on VCD but feels impaired on a daily basis by her voice. Complains of constant odynophonia and admits this is very much perpetuated by anxiety. Pt states her voice will get squeaky at times - tries not to talk throughout the day. Pt is disabled therefore voicing not required for work purposes but very much limited in social life. Pt on Zantac 300 mg 3x per day and trying to get Prilosec covered by insurance. Occasional dysphagia (cough with liquids, solid foods get stuck) but can manage with coping mechanisms and relaxation techniques. Dysphagia incidents range from 2-3x per day when she is anxious to no dysphagia incidents for months if she is feeling calm.    Emily Bonilla presents with grossly normal vocal function with momentary pitch breaks and strain complaining of odynophonia and unreliable vocal quality secondary to vocal fold edema and muscle tension. Pt with previously diagnosed paradoxical vocal fold motion, currently managed with breathing strategies after hypnotherapy and relaxation techniques. Pt mainly symptomatic for odynophonia and muscle tension. Videostroboscopy today revealed bilateral TVF edema and questionable slight bowing on LTVF. Glottic closure characterized primarily by posterior gap and anterior gap at all pitches.  Complete glottic closure attained with cues for increased breath support. Supraglottic compression noted during entirety of exam. In addition, thick mucus observed despite throat clearing and sips of water. Subglottis was patent without evidence of narrowing. No mass lesions, paresis or paralysis was noted. Voice therapy recommended at this time to focus on optimizing laryngeal functioning with reduced tension and voicing techniques to minimize odynophonia. Pt already on medication and behavioral modifications for acid reflux. Pt was amenable and agreed to the plan of care.     Subjective: Pt reports recurrence of her VCD and MTD - most commonly, attacks pair with an allergic reaction and she needs to take a Benadryl when breathing strategies do not subside symptoms.  ??  Pt currently intakes:  Water - 64 + ounces per day  Caffeine - 8 ounces coffee per day    Objective:    Goal: Session #1: Session #2: Session #3: Session #4:   Pt will adhere to vocal hygiene protocol during daily life with 80% accuracy given mod cues Pt expressed understanding    Discussed importance of hydration  Addressed. Pt adhering with ease Goal Met Doing great with water    Pt will adhere to acid reflux management diet during daily life with 80% accuracy given moderate cues Pt expressed understanding     Currently adhering to anti-reflux diet. Pt very sensitive to foods/drink Addressed. Pt adhering with ease Goal Met Going on PPI soon     Pt will perform abdominal breathing exercises at rest and during phonation with 80% accuracy and mod cues Pt performed abdominal breathing exercises:  -at rest with  65% accuracy and max cues  -during phonation with 60% accuracy and max cues Pt performed abdominal breathing exercises:    -during phonation with 70% accuracy and max cues Goal Met Pt performed resistive/ abdominal breathing exercises:    -during phonation with 70% accuracy and max cues    Pt will perform SOVT exercises during various voicing  exercises with 80% accuracy given mod cues Pt performed SOVT strawflow exercises during sustained phonation with 60% accuracy and mod cues Pt performed SOVT strawflow exercises during sustained phonation with 75% accuracy and mod cues    Air added before high pitches Goal Met  Pt performed SOVT strawflow exercises during sustained phonation with 75% accuracy and mod cues     Pt will perform RVT exercises with forward-focused resonance, adequate airflow, and buzzy phonation with 80% accuracy and mod cues Pt performed RVT exercises with 65% accuracy and max cues    Hums, hums + vowels, inflected vowels, and chanted m resonant sentences today Pt performed RVT exercises with 70% accuracy and max cues    Hums, hums + vowels, inflected vowels, and chanted m resonant sentences today    Cues to smack lips with /m/s Goal Met Pt performed RVT exercises with 70% accuracy and max cues     Assessment: Emily Bonilla returns to clinic with 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 Patient has managed both disorders very well over the years but feels she has recently taken a setback. Therapy today dedicated on reviewing most recent breathing attacks and identifying/pairing various triggers and treatments. For example, often times VCD is triggered by allergic reaction, where Benadryl is effective course in relieving symptoms. However, when VCD is exercise induced, resistive/abdominal breathing exercises most effective. Patient most successful with double sniff in through nose or rescue straw breathing. Also reviewed SOVT/RVT and laryngeal massage to alleviate muscle tension dysphonia. Patient appeared very encouraged by session. Emily Bonilla is welcome to RTC as needed for F/U.     Plan:   Follow HEP - Pt provided with handout for home practice  RTC as needed  ??  Thank You,    Tommie Raymond, M.A., CCC-SLP  Speech-Language Pathologist  [facility]   Dept. of Otolaryngology - Head & Neck  Surgery  [place] [facility] [place], [place]

## 2018-08-01 NOTE — Unmapped (Signed)
Please start Prilosec 40mg  daily, take 30 minutes before eating.  Continue Pepcid 40mg  twice a day.  Continue to avoid trigger foods for reflux symptoms.  Please complete the stool studies. Lab located on the first floor [address].   We will call you to schedule the upper endoscopy, colonoscopy, and follow up with the physician afterwards.

## 2018-08-01 NOTE — Unmapped (Signed)
GASTROENTEROLOGY CONSULTATION    HISTORY OF PRESENT ILLNESS:    Emily Bonilla is a 58 y.o. female referred from PCP for GERD.    Patient here with her helper, Emily Bonilla.  Patient reports that she began having reflux symptoms 30 years ago.  Heartburn and regurgitation are getting worse over last few months.  Having epigastric pain that can be severe, feels like a charlie horse. Has had 3 severe episodes over the last 2 months, last episode was about 10 days ago. Each episode can last a few days before spontaneously resolving.    Reflux and Epigastric pain aggravating factors- exercise, spicy food, any food if it is not  beige in color, hot food, cold food.  Reflux and epigastric pain alleviating factors- antacids including PPIs, H2blockers and has been taking Gastrocrom for last 20 years for mast cell disorder, diarrhea and reflux.  On Pepcid 40mg  BID, takes prior to eating. Has been on this for years.  Was taking Zantac high doses 900-1200mg  daily for 25 years before Pepcid.  Was on Prilosec for a couple years at a time, stopped to prevent chronic use. Last PPI use was 1.5 years ago.  Sits up in bed at night for reflux symptoms.  Denies melena.  Reports bloating and abdominal distention daily, food does and does not trigger this and can happen at random.      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 Managed with Hematology.  Says reflux symptoms, mast cell syndrome and CVID all effect each other.  Says mast cell syndrome and CVID can cause anaphylactic reactions, has multiple allergies (see list). Avoids alcohol. Has port a cath accessed almost always, will take IV Benadryl to help prevent worsening of anaphylactic symptoms. 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  Follows with Hematology for management.      Also reports chronic diarrhea.  Says she was thought to have crohn's disease years ago, no  colonoscopy records available.   Feels like she is managing bowel habits well with diet.  Says mast cell disease created diarrhea.  Currently having 2-10 BMs daily.   Has watery diarrhea every 3-4 days up to 10x/day. Ongoing since a teenage years.  On other days she has 2 formed stools daily.  No rectal bleeding.  Reports weight gain over last 2 years, 100lb gain since 2018.    06/2017 had negative stool culture, H.pylori and stool calprotectin.  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  Patient reports negative celiac disease in 2019 and around 2015.  Does report wheat allergy, avoids this.      Procedure History:  EGD- over 20+ years ago per patient showing inflammation with negative eosinophils. No records available.  Colonoscopy- 8+ years ago per patient shows inflammation, was having colonoscopies every year for a while then every 5 years due to CVID and mast cell disorders. No records available.        Past Medical History:   Diagnosis Date   ??? Anaphylaxis    ??? Anemia    ??? Anxiety    ??? Asthma    ??? Atrial fibrillation (CMS Dx)    ??? Bronchitis    ??? Heart disease    ??? Neurological disease    ??? OSA (obstructive sleep apnea)     states resolved   ???  Osteoporosis    ??? Psychogenic nonepileptic seizure    ??? Seizures (CMS Dx)    ??? Spontaneous pneumothorax     states pneumomediatsium/pneumopericardium, self resolved   ??? Thyroid disease    ??? Ulcer    ??? WPW (Wolff-Parkinson-White syndrome)        Past Surgical History:   Procedure Laterality Date   ??? BONE MARROW BIOPSY      benign   ??? CARDIAC ELECTROPHYSIOLOGY MAPPING AND ABLATION      attempted x 2, states not successful either time   ??? CHOLECYSTECTOMY  2015   ??? DENTAL EXTRACTION Right 04/25/2017    Procedure: EXTRACTION TOOTH # 31, BRIDGE RESECTIONING;  Surgeon: Gladys Damme, DMD;  Location: [facility] OR;  Service: Oral Surgery;  Laterality: Right;   ???  FACIAL COSMETIC SURGERY     ??? HYSTERECTOMY  2009   ??? INSERTION CATHETER VASCULAR ACCESS N/A 09/06/2017    Procedure: INSERTION POWER PORT A CATH;  Surgeon: Penni Bombard, MD;  Location: [facility] OR;  Service: General;  Laterality: N/A;   ??? LIVER BIOPSY      benign hepatic adednoma per patient   ??? REMOVAL HARDWARE LEG Left 08/17/2016    Procedure: REMOVAL OF HARDWARE LEFT TIBIA;  Surgeon: Andee Lineman, MD;  Location: [facility] OR;  Service: Orthopedics;  Laterality: Left;   ??? TIBIA FRACTURE SURGERY     ??? TONSILLECTOMY      + adenoids       Family History   Problem Relation Age of Onset   ??? Kidney failure Mother    ??? Aneurysm Mother        Social History     Socioeconomic History   ??? Marital status: Divorced     Spouse name: None   ??? Number of children: None   ??? Years of education: None   ??? Highest education level: None   Occupational History   ??? None   Social Needs   ??? Financial resource strain: None   ??? Food insecurity     Worry: None     Inability: None   ??? Transportation needs     Medical: None     Non-medical: None   Tobacco Use   ??? Smoking status: Never Smoker   ??? Smokeless tobacco: Never Used   ??? Tobacco comment: Denies (11/15/2016)   Substance and Sexual Activity   ??? Alcohol use: No     Comment: Denies (11/15/2016)   ??? Drug use: No     Comment: Denies (11/15/2016)   ??? Sexual activity: Not Currently   Lifestyle   ??? Physical activity     Days per week: None     Minutes per session: None   ??? Stress: None   Relationships   ??? Social Wellsite geologist on phone: None     Gets together: None     Attends religious service: None     Active member of club or organization: None     Attends meetings of clubs or organizations: None     Relationship status: None   ??? Intimate partner violence     Fear of current or ex partner: None     Emotionally abused: None     Physically abused: None     Forced sexual activity: None   Other Topics Concern   ??? Caffeine Use Yes   ??? Occupational Exposure Not Asked   ??? Exercise No   ??? Seat Belt Yes  Social History Narrative   ??? None       REVIEW OF SYSTEMS:    See pertinent positives in HPI.        PHYSICAL EXAM:    General: Normal appearance, normal affect.    Head: Normocephalic, no lesions.    Eyes: Sclera normal color. Normal hydration.    Ears: Normal.    Nose: Mucosa normal, no sinus tenderness.    Neck: No masses, no thyromegaly, no adenopathy, no bruits.    Chest: Lungs clear, no rales, no rhonchi, no wheezes.    Heart: Regular rhythm, no murmurs, no rubs, no gallops.    Abdomen: obese, round but soft, no tenderness, no masses, no enlargement of the liver or spleen, normal peristaltic sounds.    Extremities: No deformities, no edema, no erythema, normal peripheral pulses.    Neuro: Normal mental status, normal tendon reflexes, normal gait       IMPRESSION / RECOMMENDATIONS:    58 y/o woman with hx of mastocytosis/mast cell activation syndrome, common variable immunodeficiency (CVID), anemia, anxiety, asthma, Afib (not on AC), obesity, hypothyroidism, hypoparathyroidism, seizures, painful orthopaedic hardware, DM II, OSA (resolved per patient), osteoporosis, WPW, Choley (2015), hysterectomy, and nonsmoker.    Referred from PCP for GERD.  Patient also reports diarrhea.    1. Chronic GERD/Epigastric Pain/Bloating:  30 years of reflux, getting worse over last few months.  Having severe epigastric pain.  Has associated bloating with abdominal distention.  Aggravating factors- exercise, spicy food, any food if it is not  beige in color, hot food, cold food.  Alleviating factors- antacids including PPIs, H2blockers and has been taking Gastrocrom for last 20 years for mast cell disorder, diarrhea and reflux.  On Pepcid 40mg  BID.  Was taking Zantac high doses 900-1200mg  daily for 25 years before Pepcid.  Was on Prilosec for a couple years at a time, stopped to prevent chronic use. Last PPI use was 1.5 years ago.  Patient reports negative celiac disease in 2019 and around 2015.  - Schedule EGD for chronic and  severe GERD and epigastric pain.  - Start Prilosec 40mg  daily  - Can continue Pepcid 40mg  BID  - Continue to Avoid reflux trigger foods and Lifestyle modifications      2. Chronic Diarrhea/Colon screening/surveillance:  Says she was thought to have crohn's disease years ago, no colonoscopy records available.   Says mast cell disease created diarrhea.  Currently having 2-10 BMs daily.   Has watery diarrhea every 3-4 days up to 10x/day. Ongoing since a teenage years.  On other days she has 2 formed stools daily.  06/2017 had negative stool culture, H.pylori and stool calprotectin.  Choley 08/22/13  Patient reports negative celiac disease in 2019 and around 2015.  - Schedule Colonoscopy with biopsies   - Stool studies       3. RTO  - RTO with Dr. Christeen Douglas 1-2 weeks after endoscopies due to patient's complex medical history.    Electronically signed by Roxy Horseman, CNP     I spent 31-35 minutes speaking with the patient, conducting an interview, performing a limited exam, and educating the patient on my assessment and plan. I also spent 31-35 minutes, on the same day as the encounter, preparing to see the patient (eg, review of tests), obtaining and/or reviewing separately obtained history, counseling and educating the family/caregiver , ordering medications, tests, or procedures, referring and communicating with other health care professionals , documenting clinical information in the electronic or other health record, independently  interpreting results and communicating results to the patient/family/caregiver and providing care coordination .

## 2018-08-08 NOTE — Unmapped (Signed)
Patient calling, brother was there for a week and now is hospitalized for Legionnaires disease in kidneys and now lungs, had very close contact with him all week. Patient not sure if she needs to be treated since her brother is hospitalized with this and she was around him for a week.

## 2018-08-08 NOTE — Unmapped (Signed)
Legionnaire's does not transit person to person. In addition, looking through the chart, patient not at high risk for infection so recommend close monitoring. Therefore, does not need to be empirically treated. However, please have her let us know how she is feeling in the next week and if any issues arise.

## 2018-08-09 NOTE — Unmapped (Signed)
Faxed records request

## 2018-08-09 NOTE — Unmapped (Signed)
Called pt and relayed message below from Dr. Noreene Filbert. Pt states she is not ok with providing stating that she is not high risk. States she has an autoimmune dz. She ok with monitoring and getting back to pcp if has issues or symptoms.

## 2018-08-09 NOTE — Unmapped (Signed)
-----   Message from Roxy Horseman, [place] sent at 08/08/2018 12:03 PM EDT -----  Emily Bonilla,  Can you please fax a records request with our letterhead?   [facility] [phone] fax #    Specifically need Colonoscopy and biopsy reports.  Would also be helpful to have office visits, other endoscopies, labs, etc.  Thank you!  GD

## 2018-08-11 MED ORDER — budesonide DR (ENTOCORT EC) 3 mg 24 hr capsule
3 | ORAL_CAPSULE | Freq: Every morning | ORAL | 2 refills | 30.00000 days | Status: AC
Start: 2018-08-11 — End: 2018-10-20

## 2018-08-22 NOTE — Telephone Encounter (Signed)
Chart Note:  Patient's requested colon cleansing regimen if colonoscopy is scheduled- reports being allergic to psyllium and preservatives that are in all of the typical Colo cleanses. She said that she does a clear liquid diet only for 5 full days before the colonoscopy. Then when the second day starts of clear liquid diet, she starts dulcolax tablets and suppositories 2-4 every night and then 2-4 every morning before cleanse.

## 2018-09-08 ENCOUNTER — Other Ambulatory Visit: Admit: 2018-09-08 | Payer: PRIVATE HEALTH INSURANCE

## 2018-09-08 DIAGNOSIS — K529 Noninfective gastroenteritis and colitis, unspecified: Secondary | ICD-10-CM

## 2018-09-08 LAB — ENTERIC PATHOGEN PANEL
Campylobacter Group (C. ecoli, C. jejuni, C. lari): NOT DETECTED
Norovirus: NOT DETECTED
Rotavirus: NOT DETECTED
Salmonella species: NOT DETECTED
Shiga toxin 1: NOT DETECTED
Shiga toxin 2: NOT DETECTED
Shigella species: NOT DETECTED
Vibrio Group (Vibrio cholerae, Vibrio parahaemolyticus): NOT DETECTED
Yersinia enterocolitica: NOT DETECTED

## 2018-09-08 LAB — OVA AND PARASITE COMPREHENSIVE W/ GIARDIA/CRYPTO

## 2018-09-08 LAB — ROTAVIRUS ANTIGEN, STOOL: Rotavirus Ag, EIA: NEGATIVE

## 2018-09-08 LAB — CLOSTRIDIUM DIFFICILE DNA AMPLIFICATION: Clost. Diff DNA Amp.: NEGATIVE

## 2018-09-08 LAB — GIARDIA CRYPTOSPORIDIUM ANTIGENS
Cryptosporidium Ag: NEGATIVE
Giardia Ag: NEGATIVE

## 2018-09-08 LAB — STOOL FOR WBC EIA: White Blood Cells (WBC), Stool: POSITIVE — AB

## 2018-09-08 LAB — H. PYLORI ANTIGEN, STOOL: H pylori Ag, Stl: NEGATIVE

## 2018-09-08 LAB — FECAL FAT, QUALITATIVE
Fat Qual Neutral, Stl: NORMAL
Fat Quant, Stl: NORMAL

## 2018-09-14 ENCOUNTER — Ambulatory Visit
Admit: 2018-09-14 | Payer: PRIVATE HEALTH INSURANCE | Attending: Student in an Organized Health Care Education/Training Program

## 2018-09-14 DIAGNOSIS — F419 Anxiety disorder, unspecified: Secondary | ICD-10-CM

## 2018-09-14 MED ORDER — clonazePAM (KLONOPIN) 0.5 MG tablet
0.5 | ORAL_TABLET | Freq: Two times a day (BID) | ORAL | 1 refills | Status: AC | PRN
Start: 2018-09-14 — End: 2018-12-04

## 2018-09-14 MED ORDER — busPIRone (BUSPAR) 10 MG tablet
10 | ORAL_TABLET | Freq: Three times a day (TID) | ORAL | 2 refills | Status: AC
Start: 2018-09-14 — End: 2018-12-21

## 2018-09-14 MED ORDER — hydrOXYzine HCL (ATARAX) 25 MG tablet
25 | ORAL_TABLET | Freq: Three times a day (TID) | ORAL | 2 refills | Status: AC | PRN
Start: 2018-09-14 — End: 2018-12-21

## 2018-09-14 NOTE — Unmapped (Signed)
Visit Start Time: 1:45 PM   Visit Quit Time: 2:30 PM    Context:??somatic symptoms  Location: Altered mental status of anxiety  Duration: chronic  Severity:??mild  Associated Symptoms: anxiety  Modifying Factors:improving        History of Present Illness:   Emily Bonilla is a 58 y.o. female with PMH unspecified anxiety disorder, atrial fibrillation, WPW, idiopathic mast cell activation, anemia, asthma, hypothyroidism, GERD, intermittent chronic HA, PNES, presents for follow-up. Last seen by Dr. Serena Croissant on 6/18 via telehealth.    HPI   Patient reports that her anxiety has been worse in recent months due to stressors. Brother had hospitalization for DKA and MI and is in declining health, and her mother is in poor health with recent cancer diagnosis. Having a lot more anxiety due to these stressors. Not sleeping through the night. Waking up in the middle of the night, having a lot of racing thoughts. In past 3 weeks, has had maybe 4 nights of uninterrupted sleep. Taking 10mg  of doxepin for allergy reasons, but that dose does not make her sleepy. Has a lot of regular exposure to antihistamines due to reactions, uses IV benadryl regularly and is not sedated by it. Had been using hydroxyzine about once a day back in June however now taking 25mg  in the morning, 50mg  at bedtime, and 25mg  in the afternoon. Has been on buspar for 2.5 years, reports that it has been helpful. Taking 20mg  TID. Keeps her days more balanced. Feeling less overwhelmed. SSRI medications in the past have worsened her depression, made her suicidal, had to go to the psych ER due to these thoughts. Overall patient feels that she is riding a Energy manager most days related to her anxiety, expects it to pass as her family stressors pan out but currently stressed. Denies SI, HI, AVH.        Patient History:     Psychiatric History:     Prior Inpatient Psychiatric Hospitalization(s): denies  Prior Psychotropic Medications: reports taking prior psychotropic  medications.  Current Psychotropic Medications: yes - see below current psychotropic medications.  SA: denies    Past Medical History:   Diagnosis Date   ??? Anaphylaxis    ??? Anemia    ??? Anxiety    ??? Asthma    ??? Atrial fibrillation (CMS Dx)    ??? Bronchitis    ??? Heart disease    ??? Neurological disease    ??? OSA (obstructive sleep apnea)     states resolved   ??? Osteoporosis    ??? Psychogenic nonepileptic seizure    ??? Seizures (CMS Dx)    ??? Spontaneous pneumothorax     states pneumomediatsium/pneumopericardium, self resolved   ??? Thyroid disease    ??? Ulcer    ??? WPW (Wolff-Parkinson-White syndrome)        Past Surgical History:   Procedure Laterality Date   ??? BONE MARROW BIOPSY      benign   ??? CARDIAC ELECTROPHYSIOLOGY MAPPING AND ABLATION      attempted x 2, states not successful either time   ??? CHOLECYSTECTOMY  2015   ??? DENTAL EXTRACTION Right 04/25/2017    Procedure: EXTRACTION TOOTH # 31, BRIDGE RESECTIONING;  Surgeon: Gladys Damme, DMD;  Location: [facility] OR;  Service: Oral Surgery;  Laterality: Right;   ??? FACIAL COSMETIC SURGERY     ??? HYSTERECTOMY  2009   ??? INSERTION CATHETER VASCULAR ACCESS N/A 09/06/2017    Procedure: INSERTION POWER PORT A CATH;  Surgeon: Penni Bombard,  MD;  Location: [facility] OR;  Service: General;  Laterality: N/A;   ??? LIVER BIOPSY      benign hepatic adednoma per patient   ??? REMOVAL HARDWARE LEG Left 08/17/2016    Procedure: REMOVAL OF HARDWARE LEFT TIBIA;  Surgeon: Andee Lineman, MD;  Location: [facility] OR;  Service: Orthopedics;  Laterality: Left;   ??? TIBIA FRACTURE SURGERY     ??? TONSILLECTOMY      + adenoids       Social History     Socioeconomic History   ??? Marital status: Divorced     Spouse name: None   ??? Number of children: None   ??? Years of education: None   ??? Highest education level: None   Occupational History   ??? None   Social Needs   ??? Financial resource strain: None   ??? Food insecurity     Worry: None     Inability: None   ??? Transportation needs     Medical: None     Non-medical: None   Tobacco Use    ??? Smoking status: Never Smoker   ??? Smokeless tobacco: Never Used   ??? Tobacco comment: Denies (11/15/2016)   Substance and Sexual Activity   ??? Alcohol use: No     Comment: Denies (11/15/2016)   ??? Drug use: No     Comment: Denies (11/15/2016)   ??? Sexual activity: Not Currently   Lifestyle   ??? Physical activity     Days per week: None     Minutes per session: None   ??? Stress: None   Relationships   ??? Social Wellsite geologist on phone: None     Gets together: None     Attends religious service: None     Active member of club or organization: None     Attends meetings of clubs or organizations: None     Relationship status: None   ??? Intimate partner violence     Fear of current or ex partner: None     Emotionally abused: None     Physically abused: None     Forced sexual activity: None   Other Topics Concern   ??? Caffeine Use Yes   ??? Occupational Exposure Not Asked   ??? Exercise No   ??? Seat Belt Yes   Social History Narrative   ??? None       Family History   Problem Relation Age of Onset   ??? Kidney failure Mother    ??? Aneurysm Mother        Review of Systems   Constitutional: Negative for chills and fever.   HENT: Negative for congestion.    Eyes: Negative for visual disturbance.   Respiratory: Negative for cough.    Cardiovascular: Negative for chest pain.   Gastrointestinal: Positive for abdominal distention, abdominal pain, diarrhea and heartburn. Negative for vomiting.   Genitourinary: Negative for dysuria.   Musculoskeletal: Positive for myalgias. Negative for back pain.   Skin: Negative for rash.   Neurological: Positive for headaches.       Allergies:   Acetaminophen; Adhesive tape-silicones; Alcohol; Amitriptyline; Aspirin; Benzoic acid; Blue dye; Epinephrine; Flecainide; Gabapentin; Iodinated contrast media; Isopropyl alcohol; Latex; Levofloxacin; Lidocaine; Nsaids (non-steroidal anti-inflammatory drug); Onion; Other; Paraben; Perfume; Pregabalin; Preservative; Quetiapine; Red dye; Sulfa (sulfonamide  antibiotics); Sulfur; Trazodone; Verapamil; Yellow dye; Green dye; Onion extract; Doxepin; Duloxetine; Epi e-z pen; Hydroxyzine; Montelukast; Statins-hmg-coa reductase inhibitors; and Verapamil (bulk)    Medications:  Outpatient Encounter Medications as of 09/14/2018   Medication Sig Dispense Refill   ??? AMOXicillin-clavulanate (AUGMENTIN) 500-125 mg per tablet Take 1 tablet by mouth 3 times a day. 30 tablet 0   ??? blood-glucose meter Misc Use meter to test blood glucose twice daily 1 each 0   ??? budesonide DR (ENTOCORT EC) 3 mg 24 hr capsule Take 3 capsules (9 mg total) by mouth every morning. 90 capsule 2   ??? busPIRone (BUSPAR) 10 MG tablet Take 2 tablets (20 mg total) by mouth 3 times a day. 180 tablet 2   ??? cetirizine (ZYRTEC) 10 mg Cap      ??? clonazePAM (KLONOPIN) 0.5 MG tablet Take 1 tablet (0.5 mg total) by mouth 2 times a day as needed for Anxiety for up to 60 days. 60 tablet 1   ??? cromolyn (GASTROCROM) 100 mg/5 mL solution TAKE BY MOUTH 4 TIMES DAILY: BEFORE MEALS & AT BEDTIME 480 mL 0   ??? cyanocobalamin, vitamin B-12, (VITAMIN B-12 INJ) Inject 0.5 mLs as directed.     ??? diphenhydrAMINE (BENADRYL) 25 mg capsule Take 75-150 mg by mouth daily.     ??? doxepin (SINEQUAN) 10 MG capsule TAKE ONE (1) CAPSULE BY MOUTH AT BEDTIME. 30 capsule 11   ??? EASY TOUCH TWIST LANCETS 30 gauge Misc USE TWICE A DAY AS DIRECTED. 100 each 11   ??? famotidine (PEPCID) 40 MG tablet Take 40 mg by mouth 2 times a day.     ??? fexofenadine (ALLEGRA) 180 MG tablet Take 1 tablet (180 mg total) by mouth daily. 30 tablet 3   ??? furosemide (LASIX) 20 MG tablet Take 1 tablet (20 mg total) by mouth daily. 90 tablet 3   ??? hydrOXYzine HCL (ATARAX) 25 MG tablet Take 1 tablet (25 mg total) by mouth every 8 hours as needed for Anxiety. May take additional 25mg  at bedtime. 120 tablet 2   ??? lancets Misc USE TO TEST BLOOD SUGAR 2 TIMES DAILY- Easy touch twist 30g lancets 100 each 0   ??? levalbuterol (XOPENEX) 1.25 mg/3 mL nebulizer solution Inhale 3 mL  (1.25 mg total) by nebulization every 6 hours as needed for Wheezing. 72 mL 0   ??? levothyroxine (SYNTHROID) 137 MCG tablet TAKE ONE TABLET BY MOUTH ONCE A DAY. 30 tablet 0   ??? metFORMIN (GLUCOPHAGE) 500 MG tablet TAKE ONE TABLET BY MOUTH TWICE DAILY WITH MEALS 60 tablet 11   ??? montelukast (SINGULAIR) 10 mg tablet TAKE ONE TABLET BY MOUTH AT BEDTIME. 30 tablet 11   ??? nebulizer and compressor Devi Use 1 ampule as directed every 4 hours. Indications: Please dispense 1 nebulizer unit 1 each 0   ??? omeprazole (PRILOSEC) 40 MG capsule Take 1 capsule (40 mg total) by mouth every morning before breakfast. 90 capsule 0   ??? onabotulinumtoxinA (BOTOX COSMETIC) 50 unit SolR Inject into the muscle.     ??? predniSONE (DELTASONE) 10 MG tablet Take 1 tablet (10 mg total) by mouth daily. 60 tablet 1   ??? predniSONE (DELTASONE) 20 MG tablet TAKE ONE TABLET BY MOUTH DAILY. 30 tablet 0   ??? Ringer's solution,lactated (LACTATED RINGERS) infusion Intravenous 1,000 mLs once.     ??? triamcinolone (KENALOG) 0.1 % ointment APPLY 2 TIMES DAILY 80 g 11   ??? TRUE METRIX GLUCOSE TEST STRIP Strp USE TO TEST BLOOD SUGAR 2 TIMES DAILY 50 strip 11   ??? UNABLE TO FIND COMPOUNDED METHYLCOBALAMIN 1000 MCG EVERY OTHER DAY BY INJECTION/ TRISTATE 1000 mL  11   ??? VITAMIN D3-VITAMIN K2, MK4, ORAL Take by mouth.     ??? [DISCONTINUED] busPIRone (BUSPAR) 10 MG tablet Take 2 tablets (20 mg total) by mouth 3 times a day. 180 tablet 2   ??? [DISCONTINUED] hydrOXYzine HCL (ATARAX) 25 MG tablet Take 1 tablet (25 mg total) by mouth every 8 hours as needed for Itching. 90 tablet 2     No facility-administered encounter medications on file as of 09/14/2018.         Outpatient Medications Prior to Visit   Medication Sig Dispense Refill   ??? AMOXicillin-clavulanate (AUGMENTIN) 500-125 mg per tablet Take 1 tablet by mouth 3 times a day. 30 tablet 0   ??? blood-glucose meter Misc Use meter to test blood glucose twice daily 1 each 0   ??? budesonide DR (ENTOCORT EC) 3 mg 24 hr capsule Take  3 capsules (9 mg total) by mouth every morning. 90 capsule 2   ??? cetirizine (ZYRTEC) 10 mg Cap      ??? cromolyn (GASTROCROM) 100 mg/5 mL solution TAKE BY MOUTH 4 TIMES DAILY: BEFORE MEALS & AT BEDTIME 480 mL 0   ??? cyanocobalamin, vitamin B-12, (VITAMIN B-12 INJ) Inject 0.5 mLs as directed.     ??? diphenhydrAMINE (BENADRYL) 25 mg capsule Take 75-150 mg by mouth daily.     ??? doxepin (SINEQUAN) 10 MG capsule TAKE ONE (1) CAPSULE BY MOUTH AT BEDTIME. 30 capsule 11   ??? EASY TOUCH TWIST LANCETS 30 gauge Misc USE TWICE A DAY AS DIRECTED. 100 each 11   ??? famotidine (PEPCID) 40 MG tablet Take 40 mg by mouth 2 times a day.     ??? fexofenadine (ALLEGRA) 180 MG tablet Take 1 tablet (180 mg total) by mouth daily. 30 tablet 3   ??? furosemide (LASIX) 20 MG tablet Take 1 tablet (20 mg total) by mouth daily. 90 tablet 3   ??? lancets Misc USE TO TEST BLOOD SUGAR 2 TIMES DAILY- Easy touch twist 30g lancets 100 each 0   ??? levalbuterol (XOPENEX) 1.25 mg/3 mL nebulizer solution Inhale 3 mL (1.25 mg total) by nebulization every 6 hours as needed for Wheezing. 72 mL 0   ??? levothyroxine (SYNTHROID) 137 MCG tablet TAKE ONE TABLET BY MOUTH ONCE A DAY. 30 tablet 0   ??? metFORMIN (GLUCOPHAGE) 500 MG tablet TAKE ONE TABLET BY MOUTH TWICE DAILY WITH MEALS 60 tablet 11   ??? montelukast (SINGULAIR) 10 mg tablet TAKE ONE TABLET BY MOUTH AT BEDTIME. 30 tablet 11   ??? nebulizer and compressor Devi Use 1 ampule as directed every 4 hours. Indications: Please dispense 1 nebulizer unit 1 each 0   ??? omeprazole (PRILOSEC) 40 MG capsule Take 1 capsule (40 mg total) by mouth every morning before breakfast. 90 capsule 0   ??? onabotulinumtoxinA (BOTOX COSMETIC) 50 unit SolR Inject into the muscle.     ??? predniSONE (DELTASONE) 10 MG tablet Take 1 tablet (10 mg total) by mouth daily. 60 tablet 1   ??? predniSONE (DELTASONE) 20 MG tablet TAKE ONE TABLET BY MOUTH DAILY. 30 tablet 0   ??? Ringer's solution,lactated (LACTATED RINGERS) infusion Intravenous 1,000 mLs once.      ??? triamcinolone (KENALOG) 0.1 % ointment APPLY 2 TIMES DAILY 80 g 11   ??? TRUE METRIX GLUCOSE TEST STRIP Strp USE TO TEST BLOOD SUGAR 2 TIMES DAILY 50 strip 11   ??? UNABLE TO FIND COMPOUNDED METHYLCOBALAMIN 1000 MCG EVERY OTHER DAY BY INJECTION/ TRISTATE 1000 mL 11   ???  VITAMIN D3-VITAMIN K2, MK4, ORAL Take by mouth.     ??? busPIRone (BUSPAR) 10 MG tablet Take 2 tablets (20 mg total) by mouth 3 times a day. 180 tablet 2   ??? hydrOXYzine HCL (ATARAX) 25 MG tablet Take 1 tablet (25 mg total) by mouth every 8 hours as needed for Itching. 90 tablet 2     No facility-administered medications prior to visit.        Objective:   Physical Exam  Vitals:    09/14/18 1337   BP: 143/84   Pulse: 106       Gait and Stations: Normal    Mental Status Exam:   Appearance: appears stated age, casually dressed, wearing facemask, grooming and hygiene okay.  Behavior/Movement: polite, cooperative.   Speech: regular rate, rhythm, tone and prosody.  Naming and repeating intact.  No aphasia. Production within normal limit. Fluent and nonpressured.  Mood:  anxious  Affect: euthymic, full range, mood congruent, content appropriate   Thought Process: organized and linear without any flight of ideas or loosening of associations.  Thought Content: Denies suicidal ideation, denies homicidal ideation. No ruminations or obsessions, no erotomanic, grandiose, persecutory, or bizarre delusions were noted.   Perceptions: Denies auditory hallucinations, denies visual hallucinations. Not seen responding to internal stimuli on camera.   Cognition:   -Sensorium: alert   -Orientation: oriented to person, place, situation and year   -Memory: grossly intact    -Fund Of Knowledge: appears consistent with average intellect based on vocabulary,    -Insight: good    -Judgement: good    PHQ-9 Scores:   PHQ Total Score 03/30/2016 05/21/2016 06/24/2016 08/26/2016 09/29/2017   PHQ-9 Total Score 0 0 0 9 7       GAD7 Scores:   GAD7Total Score 08/26/2016 11/25/2016 02/03/2017  04/14/2017 09/29/2017   GAD-7 Total Score 10 6 2 6 4           Assessment/Plan:   58 y.o. Caucasian female, history of  unspecified anxiety disorder, atrial fibrillation, WPW, idiopathic mast cell activation, anemia, asthma, hypothyroidism, GERD, intermittent chronic HA, PNES, presents for follow up and to establish with new provider. Reports worsening anxiety, sleep disturbance, minor mood changes, largely related to social stressors with family and personal medical issues. Given high antihistaminergic burden, reluctant to increase hydroxyzine as unlikely to have significant effect on anxiety beyond current levels. Patient with poor reaction to SSRI medications in the past, with worsened depression and SI. Considered low dose antipsychotic, however significant risk of side effects or possible anaphylaxis. Will prescribe short term course of benzodiazepine for anxiety, re-evaluate in 4 weeks.     Diagnoses:  -Somatic symptom disorder  -Unspecified anxiety disorder, moderate  -Cluster B personality traits        # Anxiety, moderate  -Continue Buspirone 20 mg TID for anxiety  -Continue Hydroxyzine 25 mg BID, 50mg  QHS for anxiety. Will wean as tolerated  -Short term course of klonopin 0.5mg  BID, #60 tablets, 1 RF.  -Continue treatment with integrative medicine for biofeedback, massage therapy and acupuncture  -Self-management: meditation, yoga  -RTC in 4 weeks via video  ??  Discussed risks/benefits/side effects/alternatives to medications. Discussed the risk of cognitive decline with long-term use of anti-histamine. Patient expressed understanding of and agreement to the current treatment plan.   ??  # Safety: The patient's imminent risk appears low, as she denies SI, does not express HI, no previous SA, future-oriented and goal-oriented to continue treatment. Patient expressed understanding of resources, including 911  and PES.  ??  # Substance:  -No active issues  ??  # Medical:  -Follow-up with PCP      Elvera Bicker,  MD  Psychiatry PGY-3    Patient plan discussed and agreed upon by attending Dr. Thomasena Edis    Portions of this note were copied forward from a previous note, changes have been made throughout to accurately reflect elements obtained from today's evaluation.

## 2018-09-14 NOTE — Patient Instructions (Addendum)
It was a pleasure meeting you today, we discussed your anxiety that has been affecting your sleep and day to day function. We will trial a short term low dose of clonazepam twice a day to help with your anxiety. We will follow up in 4 weeks via video to check in on your symptoms. Please call or message [place] via Mychart with any concerns.

## 2018-09-18 ENCOUNTER — Ambulatory Visit: Payer: PRIVATE HEALTH INSURANCE | Attending: Family

## 2018-09-20 MED ORDER — levothyroxine (SYNTHROID) 137 MCG tablet
137 | ORAL_TABLET | ORAL | 0 refills | Status: AC
Start: 2018-09-20 — End: 2018-10-16

## 2018-09-21 MED ORDER — doxepin (SINEQUAN) 10 MG capsule
10 | ORAL_CAPSULE | ORAL | 0 refills | Status: AC
Start: 2018-09-21 — End: 2018-10-19

## 2018-09-21 MED ORDER — montelukast (SINGULAIR) 10 mg tablet
10 | ORAL_TABLET | ORAL | 0 refills | Status: AC
Start: 2018-09-21 — End: 2018-10-19

## 2018-09-22 ENCOUNTER — Ambulatory Visit: Admit: 2018-09-22 | Payer: PRIVATE HEALTH INSURANCE | Attending: Family

## 2018-09-22 DIAGNOSIS — G43719 Chronic migraine without aura, intractable, without status migrainosus: Secondary | ICD-10-CM

## 2018-09-22 MED ORDER — botulinum toxin A (BOTOX) injection 200 Units
100 | Freq: Once | INTRAMUSCULAR | Status: AC
Start: 2018-09-22 — End: 2018-09-22
  Administered 2018-09-22: 17:00:00 200 [IU] via INTRAMUSCULAR

## 2018-09-22 MED ORDER — sodium chloride 0.9%
INTRAMUSCULAR | Status: AC
Start: 2018-09-22 — End: 2018-09-22

## 2018-09-22 MED ORDER — botulinum toxin A (BOTOX) 100 unit injection
100 | INTRAMUSCULAR | Status: AC
Start: 2018-09-22 — End: 2018-09-22

## 2018-09-22 MED FILL — SODIUM CHLORIDE 0.9 % INJECTION SOLUTION: INTRAMUSCULAR | Qty: 10

## 2018-09-22 MED FILL — BOTOX 100 UNIT INJECTION: 100 100 unit | INTRAMUSCULAR | Qty: 2

## 2018-09-22 NOTE — Progress Notes (Signed)
MRN:  [id number]  Date: 09/22/2018   Emily Bonilla is a 58 y.o. female who comes in for scheduled Botox injection.      BOTOX PREEMPT PROTOCOL     The risks, benefits and anticipated outcomes of the procedure, the risks and benefits of the alternatives to the procedure, and the roles and tasks of the personnel to be involved, were discussed with the patient.     The side effects of onabotulinum toxin used for the treatment of chronic migraine have been described to the patient in detail.  These include: ptosis, neck pain, neck weakness, anaphylaxis, cellulitis, systemic spread (death, respiratory failure, dysarthria, dysphagia, diplopia), blood born illnesses (HIV, hepatitis, mad cow disease).   The patient is aware that any side effects related to muscle paralysis could last for 3 months.     The patient has given written informed consent to the procedure and agrees to proceed.Yes     Treatment # 6   Dilution: 5 units/0.1 ml (200 U of botox was mixed with 4 ccs of bacteriostatic saline.)  Indication: Chronic Migraine        Injection Sites:  0.1 ccs of this solution was injected into the following:  Muscle    Fixed Site/Fixed Dose  L      R   Corrugator10 Units divided in 2 sites    Procerus 5 Units in 1 site     Frontalis20 Units divided in 4 sites    Temporalis 40 Units divided in 8 sites     Occipitalis 30 Units divided in 6 sites     Cervical PSPs20 Units divided in 4 sites    Trapezius30 Units divided in 6 sites 22.5 units extra left trapezius in 5 divided doses   22.5 units extra right trapezius in 5 divided  doses       Total Units used:200  Total Units wasted:0  Patientdidtolerate procedure.     Change in Treatment Plan?no    RTC: 3 Months, For Botox and PRN    Stefan Church, CNP

## 2018-09-28 MED ORDER — cromolyn (GASTROCROM) 100 mg/5 mL solution
100 | Freq: Four times a day (QID) | ORAL | 0 refills | 24.00000 days | Status: AC
Start: 2018-09-28 — End: 2018-10-16

## 2018-09-28 NOTE — Telephone Encounter (Signed)
Patient called requested a refill on cromolyn, Singulair and doxepin.   Pharmacy did refill the Singulair and Doxepin.   They need an order for the Cromolyn.    Called and spoke with patient as well and she id receive the doxepin and Singulair.

## 2018-10-03 NOTE — Telephone Encounter (Signed)
Refaxed records request to [facility]

## 2018-10-12 ENCOUNTER — Encounter: Attending: Student in an Organized Health Care Education/Training Program

## 2018-10-16 ENCOUNTER — Ambulatory Visit: Admit: 2018-10-16 | Discharge: 2018-10-16 | Payer: PRIVATE HEALTH INSURANCE

## 2018-10-16 DIAGNOSIS — D72829 Elevated white blood cell count, unspecified: Secondary | ICD-10-CM

## 2018-10-16 MED ORDER — levothyroxineSYNTHROID137MCGtablet
137 | ORAL_TABLET | Freq: Every day | ORAL | 3 refills | Status: AC
Start: 2018-10-16 — End: 2018-12-18

## 2018-10-16 NOTE — Progress Notes (Signed)
[facility] Internal Medicine  Faculty Practice Acute Telemedicine Video Visit  Subjective:   Pre-visit planning done, including pre-visit huddle with nursing and MA staff prior to session.     Patient ID: Emily Bonilla is a 58 y.o. lady with a history of anxiety and depression, CVID (coomon variable immunodeficiency), mast cell activation syndrom,e, obesity, hypothyroidism, hypoparathyroidism, seizures, painful orthopaedic hardware, as well as diabetes mellitus 2, who is calling for an acute telemedicine video visit for multiple complaints/issues - mostly fatigue, abdominal distention, and spike in blood pressure.    Please note patient had a difficult time logging in and I was forced to call her on the phone to guide her through the pre-check-in process to help her to get to the video visit (admittedly an overly complicated process).    HPI  New problems:  Blood pressure: patient states she has had intermittently elevated blood pressures.  She states when she gets her fluids via her port her BP improves. No chest pain/pressure/discomfort, shortness of breath, dizziness, blurry vision, syncopal or presyncopal episodes.      Fatigue: patient saw a psychiatry she was started on 0.5 mg clonazepam which has helped with her sleep.  She does not have a TV in her bedroom - has a strict bed time and wake up time.     She does stretching and yoga and meditate.  She tries to stay indoors due to her allergies.  Has not been doing any cardio exercises recently.   She has been eating fresh fruits, vegetables (regularly eats either rice, potatoes).       Patient states my eczema on my scalp sometimes gets infected - she uses peroxide to clean it. She states she had an appointment to see a dermatologist, but had to cancel her appt as she was ill.  No current issues, but does get them intermittently. Has been controlled with topical steroids.     Lab Results   Component Value Date    TSH 4.73 (H) 05/26/2018    T3TOTAL 109.3  11/10/2017    FREET4 0.93 05/26/2018    THYROIDAB 747.0 (H) 06/09/2017      She has been following up with the GI for her abdominal distention it just does not want to go away.  I'll know more in the end of the month when I see them again.  She states her stool is beige.      Health Maintenance Due   Topic Date Due    Hepatitis C Screening (MyChart)  1960-07-25    Annual Medicare Wellness Visit  02-01-1960    Comprehensive Physical Exam  Jan 22, 1960    Immunization: Pneumococcal (1 of 3 - PCV13) 07/12/1967    HIV Screening  07/12/1979    Immunization: DTaP/Tdap/Td (1 - Tdap) 07/11/1980    Immunization: Hepatitis B (1 of 3 - Risk 3-dose series) 07/11/1980    Colon Cancer Screening / Stool Testing  07/12/2011    Colon Cancer Screening / Colonoscopy (MyChart)  07/12/2011    Immunization: Zoster (1 of 2) 07/12/2011    Urine Albumin/Creatinine Ratio  06/29/2017    Diabetic Foot Exam (MyChart)  06/29/2017    Diabetic Eye Exam (MyChart)  07/29/2018    Depression Monitoring (PHQ-9)  09/30/2018      The following portions of the patient's history were reviewed and updated as appropriate: allergies, current medications, past family history, past medical history, past social history, past surgical history and problem list.      Review of Systems  All other systems reviewed and are negative.    As above, otherwise negative.   Objective:    Physical Exam  Nursing note reviewed.   Constitutional:       Comments: Patient appears and sounded normal over the video visit (at her baseline), speaking in complete sentences, sounded comfortable, not in any acute physical or emotional distress.       Neurological:      Mental Status: She is oriented to person, place, and time. Mental status is at baseline.   Psychiatric:         Mood and Affect: Mood normal.         Behavior: Behavior normal.         Thought Content: Thought content normal.         Judgment: Judgment normal.       Assessment & Plan:   Mast cell  activation syndrome, with common variable immunodeficiently, with Fatigue: long stating issue - etiology unclear.  She is followed by multiple specialists, including gastroenterology, hem/onc, allergy (help appreciated).  From our stand point, we extensively discussed her sleep habits, exercise, and diet.  We went over importance of exercising at least 150 min weekly. She is limited in her fiber intake due to numerous allergies and intolerances.    - SMART goals set:    - will try to do cardio at least 30 min daily with a goal of keeping her HR > 120   - will try to continue to eat fresh fruits and vegetables   - will discuss with her GI doc (appt pending)  - we will check CBC, TSH, A1c    Leukocytosis, unspecified: as above, will check CBC with diff.     Diabetes mellitus 2 with hyperglycemia: Assessed as unchanged   - will check A1c, CMP as above.     Hypothyroidism: stable  - will check TFT's    Abdominal distention with diarrhea, stool clay color: Assessed as unchanged - etiology unclear.  Actively followed and managed by GI. No alarm symptoms.   - continue to follow up with GI    Essential Hypertension: usually near goal at home, though does have episodes of hypertension.   - discussed exercise regimen as above  - will continue to monitor for now.     Multiple allergies: Assessed as unchanged -   - will follow up with allergy.     Eczema: Assessed as deteriorated    - will make appt with dermatology.     Counseling on health promotion and disease prevention: Extensively discussed COVID19 preventative measures, including social distancing, frequent hand washing, etc.  Handout in AVS, which will be mailed to patient.      This was a Video visit, including two-way audio and video communication, in lieu of an in-person visit. The patient provided verbal consent to participate in the telehealth visit.   I spent 31-35 minutes speaking with the patient, conducting an interview, performing a limited exam, and educating  the patient on my assessment and plan. I also spent 10 minutes, on the same day as the encounter, preparing to see the patient (eg, review of tests), obtaining and/or reviewing separately obtained history, counseling and educating the family/caregiver , ordering medications, tests, or procedures, referring and communicating with other health care professionals , documenting clinical information in the electronic or other health record, independently interpreting results and communicating results to the patient/family/caregiver, providing care coordination  and performing non-face-to-face activities.     Patient  understands his/her medication(s). Proper use, potential side effects of these medications,  as well as barriers to using these medications were reviewed and addressed, as were over-the-counter medications and supplements during encounter. Response and side effects to current medications also reviewed.        Court Joy, MD, FACP  Associate Professor of Clinical Medicine  [facility] Faculty Practice  [facility]  Department of Internal Medicine  10/16/2018   2:22 PM     *[place] is currently under a [place] of Emergency for the COVID-19 Pandemic*

## 2018-10-16 NOTE — Unmapped (Signed)
What is the best phone number to reach you at?  [phone] (home)     Telephone Information:   Mobile [phone]       Do you need any refills today?  Levothyroxine  1.37  Do you have any allergies to any medications?   What pharmacy are you using today? [facility]     What are the concerns you would like to discuss with the doctor today?     Future Appointments   Date Time Provider Department Center   10/16/2018  1:40 PM Court Joy, MD [facility] HOX HOX   10/19/2018  9:40 AM Desmond Dike, MD [facility] ALG HOX HOX   11/03/2018  8:30 AM Glenna Fellows, MD [facility] GAST [facility]   11/10/2018  2:40 PM Orbie Pyo, MD [facility] ENDO [facility]   12/22/2018  1:30 PM Stefan Church, CNP [facility] NEUR [facility]       This was a phone conversation in lieu of an in-person visit. The patient provided verbal consent for an over the phone visit.    During the conversation I reconciled the medication list and the allergy list.  I also provided the appropriate screening.      Patient is aware that MD will be calling and he/she will be near the phone.      Date   Blood Pressure  Heart rate   Today   149/103  71  Last night  105/63   81  Yesterday am  170/114  80  09/26  144/87   117  09/25  126/77   88  09/25  158/98   104  09/24  157/99   109   09/23  142/88   87  09/23  154/107  105    132/84   89  09/22  133/80   90  09/20  141/90   79  09/19  148/99   84    Weight  216.6     BS  160-200 fasting    spikes into 300 and 400 at random

## 2018-10-19 ENCOUNTER — Ambulatory Visit: Admit: 2018-10-19 | Discharge: 2018-10-23 | Payer: PRIVATE HEALTH INSURANCE

## 2018-10-19 ENCOUNTER — Ambulatory Visit: Admit: 2018-10-19 | Payer: PRIVATE HEALTH INSURANCE

## 2018-10-19 DIAGNOSIS — E119 Type 2 diabetes mellitus without complications: Secondary | ICD-10-CM

## 2018-10-19 DIAGNOSIS — T782XXD Anaphylactic shock, unspecified, subsequent encounter: Secondary | ICD-10-CM

## 2018-10-19 LAB — COMPREHENSIVE METABOLIC PANEL, SERUM
ALT: 32 U/L (ref 7–52)
AST (SGOT): 22 U/L (ref 13–39)
Albumin: 4.4 g/dL (ref 3.5–5.7)
Alkaline Phosphatase: 123 U/L (ref 36–125)
Anion Gap: 12 mmol/L (ref 3–16)
BUN: 9 mg/dL (ref 7–25)
CO2: 26 mmol/L (ref 21–33)
Calcium: 9.2 mg/dL (ref 8.6–10.3)
Chloride: 105 mmol/L (ref 98–110)
Creatinine: 0.58 mg/dL (ref 0.60–1.30)
Glucose: 113 mg/dL (ref 70–100)
Osmolality, Calculated: 295 mOsm/kg (ref 278–305)
Potassium: 4 mmol/L (ref 3.5–5.3)
Sodium: 143 mmol/L (ref 133–146)
Total Bilirubin: 0.3 mg/dL (ref 0.0–1.5)
Total Protein: 7 g/dL (ref 6.4–8.9)
eGFR AA CKD-EPI: >90 See note.
eGFR NONAA CKD-EPI: >90 See note.

## 2018-10-19 LAB — DIFFERENTIAL
Basophils Absolute: 59 /uL (ref 0–200)
Basophils Relative: 0.6 % (ref 0.0–1.0)
Eosinophils Absolute: 529 /uL (ref 15–500)
Eosinophils Relative: 5.4 % (ref 0.0–8.0)
Lymphocytes Absolute: 2646 /uL (ref 850–3900)
Lymphocytes Relative: 27 % (ref 15.0–45.0)
Monocytes Absolute: 951 /uL (ref 200–950)
Monocytes Relative: 9.7 % (ref 0.0–12.0)
Neutrophils Absolute: 5615 /uL (ref 1500–7800)
Neutrophils Relative: 57.3 % (ref 40.0–80.0)
nRBC: 0 /100 WBC (ref 0–0)

## 2018-10-19 LAB — CBC
Hematocrit: 40 % (ref 35.0–45.0)
Hemoglobin: 13.3 g/dL (ref 11.7–15.5)
MCH: 27.6 pg (ref 27.0–33.0)
MCHC: 33.3 g/dL (ref 32.0–36.0)
MCV: 83 fL (ref 80.0–100.0)
MPV: 8.3 fL (ref 7.5–11.5)
Platelets: 386 10*3/uL (ref 140–400)
RBC: 4.82 10*6/uL (ref 3.80–5.10)
RDW: 14.5 % (ref 11.0–15.0)
WBC: 9.8 10*3/uL (ref 3.8–10.8)

## 2018-10-19 LAB — THYROID FUNCTION CASCADE: TSH: 3.14 u[IU]/mL (ref 0.45–4.12)

## 2018-10-19 LAB — HEMOGLOBIN A1C: Hemoglobin A1C: 7.4 % (ref 4.0–5.6)

## 2018-10-19 LAB — PTH, INTACT: PTH: 108 pg/mL (ref 12.0–88.0)

## 2018-10-19 MED ORDER — levalbuterol (XOPENEX) 1.25 mg/3 mL nebulizer solution
1.25 | Freq: Four times a day (QID) | RESPIRATORY_TRACT | 0 refills | Status: AC | PRN
Start: 2018-10-19 — End: ?

## 2018-10-19 MED ORDER — cromolyn (GASTROCROM) 100 mg/5 mL solution
100 | Freq: Four times a day (QID) | ORAL | 0 refills | 24.00000 days | Status: AC
Start: 2018-10-19 — End: 2019-01-25

## 2018-10-19 MED ORDER — EPINEPHrine (EPIPEN) 0.3 mg/0.3 mL AtIn injection
0.3 | PACK | Freq: Once | INTRAMUSCULAR | 0 refills | 30.00000 days | Status: AC
Start: 2018-10-19 — End: 2018-10-19

## 2018-10-19 MED ORDER — doxepin (SINEQUAN) 10 MG capsule
10 | ORAL_CAPSULE | Freq: Every evening | ORAL | 2 refills | Status: AC
Start: 2018-10-19 — End: 2019-02-22

## 2018-10-19 MED ORDER — nebulizers (COMPACT COMPRESSOR NEBULIZER) Misc
0 refills | Status: AC
Start: 2018-10-19 — End: ?

## 2018-10-19 MED ORDER — montelukast (SINGULAIR) 10 mg tablet
10 | ORAL_TABLET | Freq: Every evening | ORAL | 2 refills | Status: AC
Start: 2018-10-19 — End: 2019-02-26

## 2018-10-19 MED ORDER — fluticasone propionate (FLONASE) 50 mcg/actuation nasal spray
50 | Freq: Every day | NASAL | 1 refills | Status: AC
Start: 2018-10-19 — End: ?

## 2018-10-19 MED ORDER — cetirizine (ZYRTEC) 10 mg Cap
10 | ORAL_CAPSULE | Freq: Every day | ORAL | 2 refills | Status: AC
Start: 2018-10-19 — End: ?

## 2018-10-19 MED ORDER — diphenhydrAMINE (BENADRYL) 25 mg capsule
25 | ORAL_CAPSULE | Freq: Every day | ORAL | 2 refills | Status: AC
Start: 2018-10-19 — End: ?

## 2018-10-19 MED ORDER — fexofenadine (ALLEGRA) 180 MG tablet
180 | ORAL_TABLET | Freq: Every day | ORAL | 3 refills | Status: AC
Start: 2018-10-19 — End: ?

## 2018-10-19 NOTE — Unmapped (Signed)
I saw and examined the patient.  I discussed with the resident or fellow and agree with Desmond Dike, MD's findings and plan as documented in the note.    HPI:  Pt is a 58 y/o female with h/o mast cell activation syndrome followed by Dr. Vickie Epley who returns for follow up appt.  She is over doing well on current medication regimen.  Reports hives 3 days ago.    Stable IgG at 721.  Was on IVIG for many years but none recently and has done well.  Reports some increased sinus drainage and pressure.  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    She now has a port and getting IV fluids apparently for dehydration.      Physical Exam:  Well appearing female in NAD  Lungs clear  Turbinates    A&P:  1. H/o Mast cell activation syndrome - patient still has not??previous??brought records in for our review; although report from prior visits states that she has not had e/o elevated mast cell mediators in past.  She is not willing to adjust medications or make changes currently.  I have doubts about her diagnosis and concern about over using antihistamine medications, but pt has been stable for many years on current regimen:  - cont meds as outlined in fellow's note, consider increasing cetirizine or Allegra in an effort to limit benadryl use.  She is on hydroxyzine for anxiety    2. H/o CVID on prior IVIG,??doing well without infections currently:  - currently labs are not concerning for CVID - had an isolated mildly decreased IgG with normal IgA and IgM, tetanus titers good, pneumococcal titers low  - she reports h/o rxn to pneumovax so WILL NOT check post-vaccine titers??unless she develops signs/symptoms of immunodeficiency (ie Infections)    3. Increased sinus pressure and rhinitis:  - start flonase for rhinitis  - cont sinus rinses  - she can call if symptoms do not improve    Belvin Gauss Alinda Dooms, MD
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
## 2018-10-19 NOTE — Patient Instructions (Signed)
Start Flonase nose spray (1 spray into each nostril once daily).    Go to a medical supply store to pick up a nebulizer.

## 2018-10-20 MED ORDER — budesonide DR (ENTOCORT EC) 3 mg 24 hr capsule
3 | ORAL_CAPSULE | ORAL | 0 refills | 30.00000 days | Status: AC
Start: 2018-10-20 — End: 2019-01-26

## 2018-10-20 MED ORDER — omeprazole (PRILOSEC) 40 MG capsule
40 | ORAL_CAPSULE | ORAL | 0 refills | Status: AC
Start: 2018-10-20 — End: 2018-11-03

## 2018-10-24 NOTE — Unmapped (Signed)
Completed paperwork and faxed to number given Lottie Mussel 11/01/2018 7:46 AM

## 2018-10-24 NOTE — Progress Notes (Signed)
Received paperwork from [employer] regarding PO date 10/19/2018 eval for housing resources. Paperwork will be put in the MD's mailbox today for completion and signing Lottie Mussel 10/24/2018 10:19 AM

## 2018-11-03 ENCOUNTER — Other Ambulatory Visit: Admit: 2018-11-03 | Payer: PRIVATE HEALTH INSURANCE

## 2018-11-03 ENCOUNTER — Ambulatory Visit: Admit: 2018-11-03 | Payer: PRIVATE HEALTH INSURANCE | Attending: Gastroenterology

## 2018-11-03 ENCOUNTER — Inpatient Hospital Stay: Admit: 2018-11-03 | Payer: PRIVATE HEALTH INSURANCE | Attending: Gastroenterology

## 2018-11-03 DIAGNOSIS — K529 Noninfective gastroenteritis and colitis, unspecified: Secondary | ICD-10-CM

## 2018-11-03 LAB — FOOD ALLERGY PROFILE
Allergen Clams: <0.1 kU/L
Allergen Corn: <0.1 kU/L
Allergen Egg White: 0.13 kU/L
Allergen Milk, Cow: 0.11 kU/L
Allergen Peanut: <0.1 kU/L
Allergen Scallop: <0.1 kU/L
Allergen Sesame Seed: <0.1 kU/L
Allergen Shrimp: <0.1 kU/L
Allergen Walnut: <0.1 kU/L
Allergen Wheat: 0.11 kU/L
Allergen, Codfish: <0.1 kU/L
Allergen, Soybean: <0.1 kU/L

## 2018-11-03 MED ORDER — omeprazole (PRILOSEC) 40 MG capsule
40 | ORAL_CAPSULE | Freq: Two times a day (BID) | ORAL | 5 refills | Status: AC
Start: 2018-11-03 — End: 2019-01-15

## 2018-11-03 MED ORDER — diphenhydrAMINE (BENADRYL) 50 MG capsule
50 | ORAL_CAPSULE | ORAL | 0 refills | Status: AC
Start: 2018-11-03 — End: 2018-11-06

## 2018-11-03 MED ORDER — hyoscyamine (LEVBID) 0.375 mg 12 hr tablet
0.375 | ORAL_TABLET | Freq: Two times a day (BID) | ORAL | 0 refills | Status: AC | PRN
Start: 2018-11-03 — End: 2019-04-27

## 2018-11-03 MED ORDER — predniSONE (DELTASONE) 50 MG tablet
50 | ORAL_TABLET | ORAL | 0 refills | Status: AC
Start: 2018-11-03 — End: 2018-11-06

## 2018-11-03 NOTE — Telephone Encounter (Addendum)
Addendum 11/06/18 0800:  Patient is specifically requesting IV Solumedrol and Benadryl for her IVP dye allergy.  I discussed with the Radiologist at [phone] (option1) and due to patient's anaphylactic reaction to IVP dye, it is recommended that we do Not use IV contrast. It is OK to use PO contrast and will not need Pre-meds for this.  MD aware and will plan for CT abd/pel with PO contrast only.  I spoke with radiology RN today and patient can keep current time slot that she already has scheduled.    I called patient to let her know, she states she cannot tolerate PO contrast and that this is worse than IVP dye contrast.  I will inform MD for further recommendations.    - Per MD will plan for CT AD/PEL with No IV OR PT contrast due to patient's severe allergies.

## 2018-11-03 NOTE — Unmapped (Signed)
GASTROENTEROLOGY AMBULATORY CONSULTATION NOTE    HISTORY OF PRESENT ILLNESS:    The patient is a 58 y.o. female with a history of systemic mastocytosis/mast cell activation syndrome, common variable immunodeficiency syndrome, history of cardiac arrest and anaphylaxis, GERD, status post cholecystectomy and noted to have focal hepatic adenoma, chronic diarrhea, is coming to the office for follow-up.    The patient's reports that she has been having abdominal distension for 8 months, associated with upper abdominal pain for 6 months intermittent, feels like a spasm associated with reflux and heartburn. She has regurgitation sometimes rather than an actual vomiting.  She is on Omperazole 40 mg po daily and has helped her symptoms to improve by 50%.  She is also on Pepcid 40 mg by mouth twice a day.  She reports chronic diarrhea associated with abdominal cramps.  She reports somewhat in between 1-10 bowel movements a day and sometimes is related to what she eats.  She was treated empirically with budesonide for her chronic diarrhea but that helped her diarrhea but increased her bloating and abdominal distention.  Denies any rectal bleeding or melena.  She has different and multiple allergies and reactions to different allergens.  She is on Cromolyn 100 mg by mouth 4 times a day.      Prior GI workup:  06/2017 had negative stool culture, H.pylori and stool calprotectin.  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  ??  Procedure History:  EGD- over 20+ years ago per patient showing inflammation with negative eosinophils. No records available.  Colonoscopy- 8+ years ago per patient shows inflammation, was having colonoscopies every year for a while then every 5 years due to CVID and mast cell disorders. No records available.   ??     Past Medical History:   Diagnosis Date   ??? Anaphylaxis    ??? Anemia    ???  Anxiety    ??? Asthma    ??? Atrial fibrillation (CMS Dx)    ??? Bronchitis    ??? Heart disease    ??? Neurological disease    ??? OSA (obstructive sleep apnea)     states resolved   ??? Osteoporosis    ??? Psychogenic nonepileptic seizure    ??? Seizures (CMS Dx)    ??? Spontaneous pneumothorax     states pneumomediatsium/pneumopericardium, self resolved   ??? Thyroid disease    ??? Ulcer    ??? WPW (Wolff-Parkinson-White syndrome)        Past Surgical History:   Procedure Laterality Date   ??? BONE MARROW BIOPSY      benign   ??? CARDIAC ELECTROPHYSIOLOGY MAPPING AND ABLATION      attempted x 2, states not successful either time   ??? CHOLECYSTECTOMY  2015   ??? DENTAL EXTRACTION Right 04/25/2017    Procedure: EXTRACTION TOOTH # 31, BRIDGE RESECTIONING;  Surgeon: Gladys Damme, DMD;  Location: [facility] OR;  Service: Oral Surgery;  Laterality: Right;   ??? FACIAL COSMETIC SURGERY     ??? HYSTERECTOMY  2009   ??? INSERTION CATHETER VASCULAR ACCESS N/A 09/06/2017    Procedure: INSERTION POWER PORT A CATH;  Surgeon: Penni Bombard, MD;  Location: [facility] OR;  Service: General;  Laterality: N/A;   ??? LIVER BIOPSY      benign hepatic adednoma per patient   ??? REMOVAL HARDWARE LEG Left 08/17/2016    Procedure: REMOVAL OF HARDWARE LEFT  TIBIA;  Surgeon: Andee Lineman, MD;  Location: [facility] OR;  Service: Orthopedics;  Laterality: Left;   ??? TIBIA FRACTURE SURGERY     ??? TONSILLECTOMY      + adenoids       Family History:   Family History   Problem Relation Age of Onset   ??? Kidney failure Mother    ??? Aneurysm Mother        Social History     Socioeconomic History   ??? Marital status: Divorced     Spouse name: Not on file   ??? Number of children: Not on file   ??? Years of education: Not on file   ??? Highest education level: Not on file   Occupational History   ??? Not on file   Social Needs   ??? Financial resource strain: Not on file   ??? Food insecurity     Worry: Not on file     Inability: Not on file   ??? Transportation needs     Medical: Not on file     Non-medical: Not on file   Tobacco  Use   ??? Smoking status: Never Smoker   ??? Smokeless tobacco: Never Used   ??? Tobacco comment: Denies (11/15/2016)   Substance and Sexual Activity   ??? Alcohol use: No     Comment: Denies (11/15/2016)   ??? Drug use: No     Comment: Denies (11/15/2016)   ??? Sexual activity: Not Currently   Lifestyle   ??? Physical activity     Days per week: Not on file     Minutes per session: Not on file   ??? Stress: Not on file   Relationships   ??? Social Wellsite geologist on phone: Not on file     Gets together: Not on file     Attends religious service: Not on file     Active member of club or organization: Not on file     Attends meetings of clubs or organizations: Not on file     Relationship status: Not on file   ??? Intimate partner violence     Fear of current or ex partner: Not on file     Emotionally abused: Not on file     Physically abused: Not on file     Forced sexual activity: Not on file   Other Topics Concern   ??? Caffeine Use Yes   ??? Occupational Exposure Not Asked   ??? Exercise No   ??? Seat Belt Yes   Social History Narrative   ??? Not on file       Current Medications:      Current Outpatient Medications:   ???  blood-glucose meter Misc, Use meter to test blood glucose twice daily, Disp: 1 each, Rfl: 0  ???  busPIRone (BUSPAR) 10 MG tablet, Take 2 tablets (20 mg total) by mouth 3 times a day., Disp: 180 tablet, Rfl: 2  ???  cetirizine (ZYRTEC) 10 mg Cap, Take 10 mg by mouth daily., Disp: 30 capsule, Rfl: 2  ???  clonazePAM (KLONOPIN) 0.5 MG tablet, Take 1 tablet (0.5 mg total) by mouth 2 times a day as needed for Anxiety for up to 60 days., Disp: 60 tablet, Rfl: 1  ???  cromolyn (GASTROCROM) 100 mg/5 mL solution, Take 5 mLs (100 mg total) by mouth 4 times daily before meals and at bedtime., Disp: 480 mL, Rfl: 0  ???  cyanocobalamin, vitamin B-12, (VITAMIN B-12 INJ),  Inject 0.5 mLs as directed., Disp: , Rfl:   ???  diphenhydrAMINE (BENADRYL) 25 mg capsule, Take 3-6 capsules (75-150 mg total) by mouth daily., Disp: 90 capsule, Rfl: 2  ???   doxepin (SINEQUAN) 10 MG capsule, Take 1 capsule (10 mg total) by mouth at bedtime., Disp: 30 capsule, Rfl: 2  ???  EASY TOUCH TWIST LANCETS 30 gauge Misc, USE TWICE A DAY AS DIRECTED., Disp: 100 each, Rfl: 11  ???  famotidine (PEPCID) 40 MG tablet, Take 40 mg by mouth 2 times a day., Disp: , Rfl:   ???  fexofenadine (ALLEGRA) 180 MG tablet, Take 1 tablet (180 mg total) by mouth daily., Disp: 30 tablet, Rfl: 3  ???  fluticasone propionate (FLONASE) 50 mcg/actuation nasal spray, Use 1 spray into each nostril daily., Disp: 16 g, Rfl: 1  ???  furosemide (LASIX) 20 MG tablet, Take 1 tablet (20 mg total) by mouth daily., Disp: 90 tablet, Rfl: 3  ???  hydrOXYzine HCL (ATARAX) 25 MG tablet, Take 1 tablet (25 mg total) by mouth every 8 hours as needed for Anxiety. May take additional 25mg  at bedtime., Disp: 120 tablet, Rfl: 2  ???  lancets Misc, USE TO TEST BLOOD SUGAR 2 TIMES DAILY- Easy touch twist 30g lancets, Disp: 100 each, Rfl: 0  ???  levalbuterol (XOPENEX) 1.25 mg/3 mL nebulizer solution, Inhale 3 mL (1.25 mg total) by nebulization every 6 hours as needed for Wheezing., Disp: 72 mL, Rfl: 0  ???  levothyroxine (SYNTHROID) 137 MCG tablet, Take 1 tablet (137 mcg total) by mouth daily., Disp: 90 tablet, Rfl: 3  ???  metFORMIN (GLUCOPHAGE) 500 MG tablet, TAKE ONE TABLET BY MOUTH TWICE DAILY WITH MEALS, Disp: 60 tablet, Rfl: 11  ???  montelukast (SINGULAIR) 10 mg tablet, Take 1 tablet (10 mg total) by mouth at bedtime., Disp: 30 tablet, Rfl: 2  ???  nebulizers (COMPACT COMPRESSOR NEBULIZER) Misc, Please dispense 1 nebulizer with tubing and mouthpiece., Disp: 1 each, Rfl: 0  ???  omeprazole (PRILOSEC) 40 MG capsule, TAKE 1 CAPSULE BY MOUTH EVERY MORNING BEFORE BREAKFAST, Disp: 30 capsule, Rfl: 0  ???  onabotulinumtoxinA (BOTOX COSMETIC) 50 unit SolR, Inject into the muscle., Disp: , Rfl:   ???  predniSONE (DELTASONE) 10 MG tablet, Take 1 tablet (10 mg total) by mouth daily., Disp: 60 tablet, Rfl: 1  ???  predniSONE (DELTASONE) 20 MG tablet, TAKE ONE  TABLET BY MOUTH DAILY., Disp: 30 tablet, Rfl: 0  ???  Ringer's solution,lactated (LACTATED RINGERS) infusion, Intravenous 1,000 mLs once., Disp: , Rfl:   ???  triamcinolone (KENALOG) 0.1 % ointment, APPLY 2 TIMES DAILY, Disp: 80 g, Rfl: 11  ???  TRUE METRIX GLUCOSE TEST STRIP Strp, USE TO TEST BLOOD SUGAR 2 TIMES DAILY, Disp: 50 strip, Rfl: 11  ???  UNABLE TO FIND, COMPOUNDED METHYLCOBALAMIN 1000 MCG EVERY OTHER DAY BY INJECTION/ TRISTATE, Disp: 1000 mL, Rfl: 11  ???  VITAMIN D3-VITAMIN K2, MK4, ORAL, Take by mouth., Disp: , Rfl:   ???  budesonide DR (ENTOCORT EC) 3 mg 24 hr capsule, TAKE 3 CAPSULES BY MOUTH EVERY MORNING, Disp: 90 capsule, Rfl: 0    Allergies:  Acetaminophen; Adhesive tape-silicones; Alcohol; Amitriptyline; Aspirin; Benzoic acid; Blue dye; Flecainide; Gabapentin; Iodinated contrast media; Isopropyl alcohol; Latex; Levofloxacin; Lidocaine; Nsaids (non-steroidal anti-inflammatory drug); Onion; Other; Paraben; Perfume; Pregabalin; Preservative; Quetiapine; Red dye; Sulfa (sulfonamide antibiotics); Sulfur; Trazodone; Verapamil; Yellow dye; Green dye; Onion extract; Doxepin; Duloxetine; Epi e-z pen; Hydroxyzine; Statins-hmg-coa reductase inhibitors; and Verapamil (bulk)  REVIEW OF SYSTEMS:    Review of systems reviewed in the chart.     Vitals:    BP 132/80    Pulse 78    Resp 16    Ht 5' 7.01 (1.702 m)    Wt (!) 222 lb 6.4 oz (100.9 kg)    LMP  (LMP Unknown)    BMI 34.82 kg/m??     PHYSICAL EXAM:    General: Normal appearance, normal affect.    Head: Normocephalic, no lesions.    Eyes: Sclera normal color. Normal hydration.    Ears: Normal.    Nose: Mucosa normal, no sinus tenderness.    Neck: No masses, no thyromegaly, no adenopathy, no bruits.    Chest: Lungs clear, no rales, no rhonchi, no wheezes.    Heart: Regular rhythm, no murmurs, no rubs, no gallops.    Abdomen: Diffusely distended abdomen, Soft, no tenderness, no masses, no enlargement of the liver or spleen, normal peristaltic sounds.    Rectal:  Deferred.    Extremities: No deformities, no edema, no erythema, normal peripheral pulses.    Neuro: Normal mental status, normal tendon reflexes, normal gait     DATA:     Lab Results   Component Value Date    WBC 9.8 10/19/2018    RBC 4.82 10/19/2018    HGB 13.3 10/19/2018    HCT 40.0 10/19/2018    PLT 386 10/19/2018    MCV 83.0 10/19/2018    MCH 27.6 10/19/2018    MCHC 33.3 10/19/2018    RDW 14.5 10/19/2018    NRBC 0 10/19/2018    LYMPHOPCT 27.0 10/19/2018    MONOPCT 9.7 10/19/2018    EOSPCT 5.4 10/19/2018    BASOPCT 0.6 10/19/2018    MONOSABS 951 (H) 10/19/2018    LYMPHSABS 2,646 10/19/2018    EOSABS 529 (H) 10/19/2018    BASOSABS 59 10/19/2018     Lab Results   Component Value Date    NA 143 10/19/2018    K 4.0 10/19/2018    CL 105 10/19/2018    CO2 26 10/19/2018    BUN 9 10/19/2018    CREATININE 0.58 (L) 10/19/2018    CALCIUM 9.2 10/19/2018    GLUCOSE 113 (H) 10/19/2018     Lab Results   Component Value Date    ALKPHOS 123 10/19/2018    ALT 32 10/19/2018    AST 16 10/28/2016    PROT 7.0 10/19/2018    BILITOT 0.3 10/19/2018     No results found for: PROTIME, INR, WARFARIN  No results found for: LABA1C  Lab Results   Component Value Date    TSH 3.14 10/19/2018     Lab Results   Component Value Date    VITAMINB12 163 (L) 06/09/2017     No results found for: FOLATE  Lab Results   Component Value Date    IRON 73 06/09/2017     Lab Results   Component Value Date    LABIRON 19.5 06/09/2017     Lab Results   Component Value Date    TIBC 375 06/09/2017     Lab Results   Component Value Date    FERRITIN 46.0 06/09/2017     No results found for: ANATITER, ANAPATTRN1, ANA    IMPRESSION/RECOMMENDATIONS:    1.  Epigastric pain likely due to GERD/gastritis:  Increased acidity of the stomach and GERD is one of the GI manifestations of systemic mastocytosis.  Symptoms has improved with omeprazole 40 mg  by mouth daily and Pepcid 40 mg by mouth twice a day.  I will go ahead and increase omeprazole to 40 mg by mouth twice a  day.  Antireflux lifestyle.  I would consider increasing famotidine but the patient continues to have epigastric pain and reflux.  2.  Chronic diarrhea:  Also likely related to systemic mastocytosis/mast cell activation syndrome.  I will put the patient on hyoscyamine 0.375 mg by mouth twice a day when necessary.  Low FODMAP diet.  Food allergy profile.   Discontinue budesonide due to associated bloating.  If there is no improvement in the next visit I would consider increasing the dose of Cromolyn from 100 mg by mouth 4 times a day to 200 mg by mouth 4 times a day.  3.  New onset abdominal distention:  I will go ahead and order CT scan of the abdomen and pelvis with IV contrast to rule out intra-abdominal tumors.  The patient will need to be premedicated with IV Solu-Medrol and Benadryl.  We will communicate with radiology department for coordination of premedication.  KUB was ordered and reviewed which show moderate colonic stool burden.    RTC in one month.    More than half of the visit time spent in counseling the patient face-to-face about her diagnosis and the plan of care as well as answering her questions and coordination of patient's care  The diagnoses and the plan of care was discussed with the patient.   Thank you for giving me the chance to participate in the care of your patient.    Electronically signed by Glenna Fellows, MD on 11/03/2018 at 8:30 AM.

## 2018-11-03 NOTE — Patient Instructions (Signed)
Low FODMAP diet

## 2018-11-03 NOTE — Unmapped (Signed)
-----   Message from Glenna Fellows, MD sent at 11/03/2018 12:54 PM EDT -----  Ander Purpura:  The patient needs to be premedicated before CT scan of the abdomen and pelvis with IV contrast with Solu-Medrol and Benadryl IV.  Can you please check with radiology how they want [place] to do that and if you know how to make this arrangement.  Thank you,  HA

## 2018-11-06 NOTE — Unmapped (Signed)
Emily Bonilla/[facility] CT called requesting to clarify CT order    *No IV or oral contrast    Note that call was disconnected

## 2018-11-06 NOTE — Unmapped (Signed)
Addended by: Kara Dies on: 11/06/2018 10:41 AM     Modules accepted: Orders

## 2018-11-06 NOTE — Unmapped (Signed)
Addended by: Kara Dies on: 11/06/2018 09:54 AM     Modules accepted: Orders

## 2018-11-06 NOTE — Unmapped (Signed)
Addended by: Kara Dies on: 11/06/2018 10:56 AM     Modules accepted: Orders

## 2018-11-07 NOTE — Telephone Encounter (Signed)
Updated radiology and patient today with plan for CT ABD/PEL without IV or PO contrast.  Patient will contact scheduling.

## 2018-11-10 ENCOUNTER — Ambulatory Visit: Admit: 2018-11-10 | Discharge: 2018-11-10 | Payer: PRIVATE HEALTH INSURANCE | Attending: "Endocrinology

## 2018-11-10 DIAGNOSIS — E119 Type 2 diabetes mellitus without complications: Secondary | ICD-10-CM

## 2018-11-10 MED ORDER — dulaglutide 0.75 mg/0.5 mL PnIj
0.75 | SUBCUTANEOUS | 3 refills | Status: AC
Start: 2018-11-10 — End: 2019-01-09

## 2018-11-10 NOTE — Patient Instructions (Signed)
Ok to discontinue metformin to see if your symptoms improve.     Start Trulicity 0.75mg  weekly.     Continue levothyroxine daily    Please try to get 1200mg  calcium daily from diet and supplements together.     Please go to the lab to check your vitamin D.     Follow-up 6 months

## 2018-11-10 NOTE — Unmapped (Signed)
Patient ID:  58 y.o. 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       She reports being diagnosed with osteopenia in 2010 on outside DXA scan. Her fracture history includes a fractured tibia and fibula from falling down 2 steps after passing out.   DXA scan on 10/18/17 showed lowest T-score of -1.8 at the femoral neck    Labs on 06/02/17 showed vitamin D=10.5 and PTH=151pg/mL on 06/02/2017.  She was prescribed ergocalciferol but could not tolerate and was switched to vitamin D3 5000units daily.  Vitamin D=28.3 on 08/25/17.    She was diagnosed with prediabetes in 2017, but had rapid weight gain in early 2019 and progressed to Type 2 diabetes with A1c=7.3% on 06/09/17.  So she was started on metformin 500mg  BID. A1c=6.7% on 10/12/17   Outside A1c= 5.5% on 01/31/18    She also has a history of hypothyroidism d/t hashimoto's disease on levothyroxine. She saw Dr Angelia Mould on 06/29/2017, who performed in-office ultrasound that showed heterogenous thyroid with not discrete nodule, c/w thyroiditis.     She has a history of vitamin D deficiency and secondary hyperparathyroidism, which had improved with vitamin D supplementation.  Labs on 11/10/17 showed vitD=37.7ng/mL, and PTH=79pg/mL.    Interval History:    Since her previous appt on 05/12/18, she has had more abdominal distention and diarrhea.  She is unsure if it is related to metformin.  She continues to take metformin 500mg  BID.     She checks he BG up to 3x daily if she does not feel well.     Download of her meter showed a 14d average=176mg /dL.  BGs mostly 140-200mg /dL,     Labs on 16/1/09 showed Ca=9.2mg /dL, UEA=5.40JWJ/XB, JYN=829FA/OZ, A1c=7.4%.    She endorses having polyuria and polydipsia.    She is taking vitamin 10000units 5x weekly.   She does not take a calcium supplement and consumes 1-2 serving     PMH:  - Mast cell activation syndrome  - CVID  - osteopenia  - hypothyroidism d/t hashimotos's  - Type 2 diabetes mellitus    Family Hx:  Paternal grandmother - CADw/MI s/p CABG  Maternal grandfather - CADw/MI  Maternal uncle - CADw/MI  Maternal aunt - mastoctytosis, osteoporosis  Mom- thyroid cancer, hypothyroid, mastocytosis, HLD, osteoporosis  Niece- thyroid cancer, hypothyroid  Nephew- thyroid cancer, hypothyroid  Brother -  Type 1 diabetes, HLD, CADw/MI s/p CABG PCI     Social Hx:  - lives in [place], [place]  - No smoking, EtOH or illicit drugs    Current Outpatient Medications   Medication Sig Dispense Refill   ??? blood-glucose meter Misc Use meter to test blood glucose twice daily 1 each 0   ??? budesonide DR (ENTOCORT EC) 3 mg 24 hr capsule TAKE 3 CAPSULES BY MOUTH EVERY MORNING 90 capsule 0   ??? busPIRone (BUSPAR) 10 MG tablet Take 2 tablets (20 mg total) by mouth 3 times a day. 180 tablet 2   ??? cetirizine (ZYRTEC) 10 mg Cap Take 10 mg by mouth daily. 30 capsule 2   ??? clonazePAM (KLONOPIN) 0.5 MG tablet Take 1 tablet (0.5 mg total) by mouth 2 times a day as needed for Anxiety for up to 60 days. 60 tablet 1   ??? cromolyn (GASTROCROM) 100 mg/5  mL solution Take 5 mLs (100 mg total) by mouth 4 times daily before meals and at bedtime. 480 mL 0   ??? cyanocobalamin, vitamin B-12, (VITAMIN B-12 INJ) Inject 0.5 mLs as directed.     ??? diphenhydrAMINE (BENADRYL) 25 mg capsule Take 3-6 capsules (75-150 mg total) by mouth daily. 90 capsule 2   ??? doxepin (SINEQUAN) 10 MG capsule Take 1 capsule (10 mg total) by mouth at bedtime. 30 capsule 2   ??? EASY TOUCH TWIST LANCETS 30 gauge Misc USE TWICE A DAY AS DIRECTED. 100 each 11   ??? famotidine (PEPCID) 40 MG tablet Take 40 mg by mouth 2 times a day.     ??? fexofenadine (ALLEGRA) 180 MG tablet Take 1 tablet (180 mg total) by mouth daily. 30 tablet 3   ??? fluticasone propionate (FLONASE) 50 mcg/actuation nasal spray Use  1 spray into each nostril daily. 16 g 1   ??? furosemide (LASIX) 20 MG tablet Take 1 tablet (20 mg total) by mouth daily. 90 tablet 3   ??? hydrOXYzine HCL (ATARAX) 25 MG tablet Take 1 tablet (25 mg total) by mouth every 8 hours as needed for Anxiety. May take additional 25mg  at bedtime. 120 tablet 2   ??? hyoscyamine (LEVBID) 0.375 mg 12 hr tablet Take 1 tablet (0.375 mg total) by mouth every 12 hours as needed for Cramping. 60 tablet 0   ??? lancets Misc USE TO TEST BLOOD SUGAR 2 TIMES DAILY- Easy touch twist 30g lancets 100 each 0   ??? levalbuterol (XOPENEX) 1.25 mg/3 mL nebulizer solution Inhale 3 mL (1.25 mg total) by nebulization every 6 hours as needed for Wheezing. 72 mL 0   ??? levothyroxine (SYNTHROID) 137 MCG tablet Take 1 tablet (137 mcg total) by mouth daily. 90 tablet 3   ??? metFORMIN (GLUCOPHAGE) 500 MG tablet TAKE ONE TABLET BY MOUTH TWICE DAILY WITH MEALS 60 tablet 11   ??? montelukast (SINGULAIR) 10 mg tablet Take 1 tablet (10 mg total) by mouth at bedtime. 30 tablet 2   ??? nebulizers (COMPACT COMPRESSOR NEBULIZER) Misc Please dispense 1 nebulizer with tubing and mouthpiece. 1 each 0   ??? omeprazole (PRILOSEC) 40 MG capsule Take 1 capsule (40 mg total) by mouth 2 times a day before meals. 60 capsule 5   ??? onabotulinumtoxinA (BOTOX COSMETIC) 50 unit SolR Inject into the muscle.     ??? predniSONE (DELTASONE) 10 MG tablet Take 1 tablet (10 mg total) by mouth daily. 60 tablet 1   ??? predniSONE (DELTASONE) 20 MG tablet TAKE ONE TABLET BY MOUTH DAILY. 30 tablet 0   ??? Ringer's solution,lactated (LACTATED RINGERS) infusion Intravenous 1,000 mLs once.     ??? triamcinolone (KENALOG) 0.1 % ointment APPLY 2 TIMES DAILY 80 g 11   ??? TRUE METRIX GLUCOSE TEST STRIP Strp USE TO TEST BLOOD SUGAR 2 TIMES DAILY 50 strip 11   ??? UNABLE TO FIND COMPOUNDED METHYLCOBALAMIN 1000 MCG EVERY OTHER DAY BY INJECTION/ TRISTATE 1000 mL 11   ??? VITAMIN D3-VITAMIN K2, MK4, ORAL Take by mouth.       No current facility-administered medications for this  visit.      Objective:  Physical Exam:  Gen: caucasian female  in NAD, alert, cooperative   Eyes: PERRL, EOMI, no lid lad, periorbital edema, or proptosis  ENT: MMM, no oral lesions or exudates   Neck: nontender, no palpable masses  Resp: CTAB, no wheezes   CV: RRR, no murmur   Back: nontender to palpation of  spine, no CVA tenderness, No kyphosis or scoliosis  Abd: soft, nondistended, nontender  Ext: no peripheral edema, 2+ pulses   Neuro: no tremor, normal gait, Strength 5/5 throughout  Psych: normal speech and mood, appropriate thought content,      Labs:  Component      Latest Ref Rng & Units 11/10/2017 05/26/2018 10/19/2018   Creatinine      0.60 - 1.30 mg/dL 1.61 0.96 0.45 (L)   Glucose      70 - 100 mg/dL 409 (H) 82 811 (H)   Calcium      8.6 - 10.3 mg/dL 9.5 9.0 9.2   eGFR NONAA CKD-EPI      See note. 86 >90 >90   Phosphorus      2.1 - 4.7 mg/dL 4.1 3.2    Cholesterol, Total      0 - 200 mg/dL  914 (H)    Triglycerides, Serum      10 - 149 mg/dL  782 (H)    HDL      60 - 92 mg/dL  32 (L)    LDL Cholesterol      mg/dL  956    Free T4      2.13 - 1.76 ng/dL 0.86 5.78    TSH      4.69 - 4.12 uIU/mL 3.82 4.73 (H) 3.14   T3, Total      60.0 - 220.0 ng/dL 629.5     Vit D, 28-[id number]      30.0 - 100.0 ng/mL 37.7 33.8    PTH      12.0 - 88.0 pg/mL 79.0  108.0 (H)   Hemoglobin A1C      4.0 - 5.6 %  7.1 (H) 7.4 (H)     Imaging:      Date LS Left FN Left TH Right FN Right TH 1/3 FA     10/18/17 BMD:0.970g/cm2  T-score: -0.7 SD   Z-score: 0.5 SD BMD:0.633 g/cm2  T-score: -1.9  SD  Z-score: -0.8 SD BMD:0.953 g/cm2   T-score: 0.1 SD   Z-score: 0.8 SD  BMD: 0.651 g/cm2  T-score: -1.8  SD  Z-score: -0.7 SD BMD: 0.877g/cm2   T-score: -0.5 SD   Z-score: 0.2 SD BMD: 0.650 g/cm2  T-score: -0.7  SD  Z-score: 0.3 SD       Assessment/Plan:  58 y.o. female with osteopenia, hypothyroidism, hyperparathyroidism, and Type 2 diabetes.    Emily Bonilla has bone density in the osteopenic range.  She has multiple risk factors for low bone  density including, sex, postmenopausal status, extensive corticosteroid use, secondary hyperparathyroidism, and family history.  Her estimated risk for fracture by FRAX is below the threshold for treatment and she is no longer taking corticosteroids, so we will not start pharmacologic therapy and with monitor with a DXA scan in 1 year.       She has elevated PTH with normal serum calcium levels c/w secondary hyperparathyroidism.  This is likely due to inadequate calcium intake/absorption possible related to her diarrhea.   She was encouraged to increase her calcium intake and try to achieve 1200mg  daily.  We will also check a vitamin D level to see if it is adequate.        She also has hypothyroidism d/t hashimoto's disease and have been treated with levothyroxine.  She was euthyroid on her last TFT check. Therefore we will continue levothyroxine daily     She has decent glycemic control as indicated by  her A1c=7.4%, but is above goal.  Her symptoms are not likely related to metformin since she had been tolerated it for some time prior to the symptoms, but it cannot be excluded.  Therefore we will discontinue metformin to see if her symptoms improve.  We will start Trulicity instead.     Plan:  - discontinue metformin 500mg  BID  - Start dulaglutide .75mg  weekly  - continue levothyroxine daily, for now  - 1200mg  of calcium daily form diet and supplements combined  -  Check vitamin D levels.  - DXA scan in 10/2019    Follow-up in 6 months.

## 2018-11-10 NOTE — Unmapped (Signed)
Pre-charting completed with patient via telephone on 11/06/2018. Reviewed allergies, medications, and medical/surgical history. Verified pharmacy and pended refill requests. Pended visit diagnoses based on prior office visit. Last dexa scan 10/18/2017 and labs completed 10/19/2018. Patient requests refill of Vitamin B12 injections, if possible.

## 2018-11-14 NOTE — Telephone Encounter (Addendum)
Received a call from the patient, stating [facility] needed a diagnosis code for the nebulizer.  Called [facility] requesting them to refax the order to Dr. Welton Flakes.    [facility] to fax order form to [phone].    [facility] Nakayama given the physician NPI #.   Provided them with a Dx code as well.      Called and spoke with patient as well.   Patient ise another form needs to be signed by Dr. Welton Flakes.     Received form from [facility].  Form placed in Dr. Sondra Barges.   Form to be signed on Thursday.

## 2018-11-16 NOTE — Telephone Encounter (Signed)
[facility] Nakayama forms completed and singed by Dr. Welton Flakes.   Orders faxed to [facility]  Received confirmation of the fax.   Called and spoke to patient to let her know the paperwork was faxed and sent.

## 2018-11-25 ENCOUNTER — Inpatient Hospital Stay: Admit: 2018-11-25 | Payer: PRIVATE HEALTH INSURANCE | Attending: Gastroenterology

## 2018-11-25 DIAGNOSIS — K769 Liver disease, unspecified: Secondary | ICD-10-CM

## 2018-11-28 MED ORDER — metFORMINGLUCOPHAGE500MGtablet
500 | ORAL_TABLET | ORAL | 0 refills | Status: AC
Start: 2018-11-28 — End: 2019-01-26

## 2018-11-28 NOTE — Unmapped (Signed)
-----   Message from Glenna Fellows, MD sent at 11/27/2018  5:58 PM EST -----  Regarding: RE:   I spoke with the radiology.  They are OK with MRI of the abdomen without IV contrast.  Please discussed with the patient that she needs to have an MRI of the abdomen without contrast if she is agreeable to do that to rule out liver lesions.  Thank you,  HA  ----- Message -----  From: Roxy Horseman, CNP  Sent: 11/27/2018   7:25 AM EST  To: Glenna Fellows, MD  Subject: FW:                                                ----- Message -----  From: Interface, Results In  Sent: 11/25/2018  12:40 PM EST  To: Roxy Horseman, CNP

## 2018-11-28 NOTE — Telephone Encounter (Signed)
Spoke with patient about CT results and recommendations for MRI without IV contrast.   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 This was 5 years ago.  She is agreeable to MRI to evaluate for further adenomas.     She also reports that her Mom was recently diagnosed with colorectal cancer at age 57.    I will inform MD of patient's histories and she will keep scheduled appt for symptom follow up.

## 2018-12-04 ENCOUNTER — Ambulatory Visit: Payer: PRIVATE HEALTH INSURANCE | Attending: Gastroenterology

## 2018-12-04 NOTE — Telephone Encounter (Signed)
LOV-09/14/2018

## 2018-12-05 MED ORDER — clonazePAM (KLONOPIN) 0.5 MG tablet
0.5 | ORAL_TABLET | Freq: Two times a day (BID) | ORAL | 2 refills | Status: AC | PRN
Start: 2018-12-05 — End: 2019-03-05

## 2018-12-12 ENCOUNTER — Inpatient Hospital Stay: Admit: 2018-12-12 | Payer: PRIVATE HEALTH INSURANCE | Attending: Gerontology

## 2018-12-12 DIAGNOSIS — K760 Fatty (change of) liver, not elsewhere classified: Secondary | ICD-10-CM

## 2018-12-18 MED ORDER — levothyroxineSYNTHROID137MCGtablet
137 | ORAL_TABLET | Freq: Every day | ORAL | 3 refills | Status: AC
Start: 2018-12-18 — End: 2019-10-12

## 2018-12-19 NOTE — Telephone Encounter (Signed)
Patient calling to reschedule an appointment due to exposure.

## 2018-12-19 NOTE — Unmapped (Signed)
Patient left voicemail message at: [phone]    Calling because HHA has tested positive for covid. Patient states Saturday she started having sinus congestion, fatigue, no fever. Would like to know what to do.        Call to pt, let her know will send message to MD. Did have some sore throat, a little muscle pain. Is eating and drinking normally. Patient states she is unable to get out to get a covid test. Let her know she should quarantine, stay rested and hydrated, go to ED with any respiratory distress.    Forwarded to provider for review.

## 2018-12-19 NOTE — Unmapped (Signed)
Addended by: Court Joy on: 12/19/2018 05:17 PM     Modules accepted: Orders

## 2018-12-20 ENCOUNTER — Ambulatory Visit: Admit: 2018-12-20 | Discharge: 2018-12-27 | Payer: PRIVATE HEALTH INSURANCE | Attending: Gastroenterology

## 2018-12-20 DIAGNOSIS — K219 Gastro-esophageal reflux disease without esophagitis: Secondary | ICD-10-CM

## 2018-12-20 NOTE — Patient Instructions (Signed)
Nonalcoholic Fatty Liver Disease Diet  Nonalcoholic fatty liver disease is a condition that causes fat to accumulate in and around the liver. The disease makes it harder for the liver to work the way that it should. Following a healthy diet can help to keep nonalcoholic fatty liver disease under control. It can also help to prevent or improve conditions that are associated with the disease, such as heart disease, diabetes, high blood pressure, and abnormal cholesterol levels. Along with regular exercise, this diet:  ?? Promotes weight loss.  ?? Helps to control blood sugar levels.  ?? Helps to improve the way that the body uses insulin.  What do I need to know about this diet?  ?? Use the glycemic index (GI) to plan your meals. The index tells you how quickly a food will raise your blood sugar. Choose low-GI foods. These foods take a longer time to raise blood sugar.  ?? Keep track of how many calories you take in. Eating the right amount of calories will help you to achieve a healthy weight.  ?? You may want to follow a Mediterranean diet. This diet includes a lot of vegetables, lean meats or fish, whole grains, fruits, and healthy oils and fats.  What foods can I eat?  Grains  Whole grains, such as whole-wheat or whole-grain breads, crackers, tortillas, cereals, and pasta. Stone-ground whole wheat. Pumpernickel bread. Unsweetened oatmeal. Bulgur. Barley. Quinoa. Brown or wild rice. Corn or whole-wheat flour tortillas.  Vegetables  Lettuce. Spinach. Peas. Beets. Cauliflower. Cabbage. Broccoli. Carrots. Tomatoes. Squash. Eggplant. Herbs. Peppers. Onions. Cucumbers. Brussels sprouts. Yams and sweet potatoes. Beans. Lentils.  Fruits  Bananas. Apples. Oranges. Grapes. Papaya. Mango. Pomegranate. Kiwi. Grapefruit. Cherries.  Meats and Other Protein Sources  Seafood and shellfish. Lean meats. Poultry. Tofu.  Dairy  Low-fat or fat-free dairy products, such as yogurt, cottage cheese, and cheese.  Beverages  Water. Sugar-free  drinks. Tea. Coffee. Low-fat or skim milk. Milk alternatives, such as soy or almond milk. Real fruit juice.  Condiments  Mustard. Relish. Low-fat, low-sugar ketchup and barbecue sauce. Low-fat or fat-free mayonnaise.  Sweets and Desserts  Sugar-free sweets.  Fats and Oils  Avocado. Canola or olive oil. Nuts and nut butters. Seeds.  The items listed above may not be a complete list of recommended foods or beverages. Contact your dietitian for more options.  What foods are not recommended?  Palm oil and coconut oil. Processed foods. Fried foods. Sweetened drinks, such as sweet tea, milkshakes, snow cones, iced sweet drinks, and sodas. Alcohol. Sweets. Foods that contain a lot of salt or sodium.  The items listed above may not be a complete list of foods and beverages to avoid. Contact your dietitian for more information.  This information is not intended to replace advice given to you by your health care provider. Make sure you discuss any questions you have with your health care provider.  Document Released: 05/21/2014 Document Revised: 06/12/2015 Document Reviewed: 01/29/2014  [employer]vier Interactive Patient Education ?? 2018 [employer]

## 2018-12-20 NOTE — Unmapped (Signed)
GASTROENTEROLOGY AMBULATORY CONSULTATION NOTE    HISTORY OF PRESENT ILLNESS:    The patient is a 58 y.o. female with a history of systemic mastocytosis/mast cell activation syndrome, common variable immunodeficiency syndrome, history of cardiac arrest and anaphylaxis, GERD, status post cholecystectomy, focal hepatic adenoma, fatty liver, chronic diarrhea, has an appointment today for follow-up.  The patient reports that she has been feeling better with less nausea and epigastric pain.  She is on omeprazole 40 mg by mouth twice a day and Pepcid 40 mg by mouth twice a day.  She also reports that her diarrhea is better with low-fat diet.  She is also using hyoscyamine 0.375 mg by mouth twice a day as needed for abdominal cramps and diarrhea.  She is still complaining from abdominal obesity.  She was treated empirically with budesonide for her chronic diarrhea but that helped her diarrhea but increased her bloating and abdominal distention.  She is currently off budesonide.  Denies any rectal bleeding or melena.  She has different and multiple allergies and reactions to different allergens.  She is on Cromolyn 100 mg by mouth 4 times a day.    Prior GI workup:  06/2017 had negative stool culture, H.pylori and stool calprotectin.  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  ??  Procedure History:  EGD- over 20+ years ago per patient showing inflammation with negative eosinophils. No records available.  Colonoscopy- 8+ years ago per patient shows inflammation, was having colonoscopies every year for a while then every 5 years due to CVID and mast cell disorders. No records available.   ??     Past Medical History:   Diagnosis Date   ??? Anaphylaxis    ??? Anemia    ??? Anxiety    ??? Asthma    ??? Atrial fibrillation (CMS Dx)    ??? Bronchitis    ??? Heart disease    ??? Neurological disease    ??? OSA (obstructive  sleep apnea)     states resolved   ??? Osteoporosis    ??? Psychogenic nonepileptic seizure    ??? Seizures (CMS Dx)    ??? Spontaneous pneumothorax     states pneumomediatsium/pneumopericardium, self resolved   ??? Thyroid disease    ??? Ulcer    ??? WPW (Wolff-Parkinson-White syndrome)        Past Surgical History:   Procedure Laterality Date   ??? BONE MARROW BIOPSY      benign   ??? CARDIAC ELECTROPHYSIOLOGY MAPPING AND ABLATION      attempted x 2, states not successful either time   ??? CHOLECYSTECTOMY  2015   ??? DENTAL EXTRACTION Right 04/25/2017    Procedure: EXTRACTION TOOTH # 31, BRIDGE RESECTIONING;  Surgeon: Gladys Damme, DMD;  Location: [facility] OR;  Service: Oral Surgery;  Laterality: Right;   ??? FACIAL COSMETIC SURGERY     ??? HYSTERECTOMY  2009   ??? INSERTION CATHETER VASCULAR ACCESS N/A 09/06/2017    Procedure: INSERTION POWER PORT A CATH;  Surgeon: Penni Bombard, MD;  Location: [facility] OR;  Service: General;  Laterality: N/A;   ??? LIVER BIOPSY      benign hepatic adednoma per patient   ??? REMOVAL HARDWARE LEG Left 08/17/2016    Procedure: REMOVAL OF HARDWARE LEFT TIBIA;  Surgeon: Andee Lineman, MD;  Location: [facility] OR;  Service: Orthopedics;  Laterality: Left;   ??? TIBIA FRACTURE SURGERY     ???  TONSILLECTOMY      + adenoids       Family History:   Family History   Problem Relation Age of Onset   ??? Kidney failure Mother    ??? Aneurysm Mother        Social History     Socioeconomic History   ??? Marital status: Divorced     Spouse name: Not on file   ??? Number of children: Not on file   ??? Years of education: Not on file   ??? Highest education level: Not on file   Occupational History   ??? Not on file   Social Needs   ??? Financial resource strain: Not on file   ??? Food insecurity     Worry: Not on file     Inability: Not on file   ??? Transportation needs     Medical: Not on file     Non-medical: Not on file   Tobacco Use   ??? Smoking status: Never Smoker   ??? Smokeless tobacco: Never Used   Substance and Sexual Activity   ??? Alcohol use: No     Comment:  Denies (11/15/2016)   ??? Drug use: No     Comment: Denies (11/15/2016)   ??? Sexual activity: Not Currently   Lifestyle   ??? Physical activity     Days per week: Not on file     Minutes per session: Not on file   ??? Stress: Not on file   Relationships   ??? Social Wellsite geologist on phone: Not on file     Gets together: Not on file     Attends religious service: Not on file     Active member of club or organization: Not on file     Attends meetings of clubs or organizations: Not on file     Relationship status: Not on file   ??? Intimate partner violence     Fear of current or ex partner: Not on file     Emotionally abused: Not on file     Physically abused: Not on file     Forced sexual activity: Not on file   Other Topics Concern   ??? Caffeine Use Yes   ??? Occupational Exposure Not Asked   ??? Exercise No   ??? Seat Belt Yes   Social History Narrative   ??? Not on file       Current Medications:      Current Outpatient Medications:   ???  blood-glucose meter Misc, Use meter to test blood glucose twice daily, Disp: 1 each, Rfl: 0  ???  budesonide DR (ENTOCORT EC) 3 mg 24 hr capsule, TAKE 3 CAPSULES BY MOUTH EVERY MORNING, Disp: 90 capsule, Rfl: 0  ???  cetirizine (ZYRTEC) 10 mg Cap, Take 10 mg by mouth daily., Disp: 30 capsule, Rfl: 2  ???  clonazePAM (KLONOPIN) 0.5 MG tablet, Take 1 tablet (0.5 mg total) by mouth 2 times a day as needed for Anxiety., Disp: 60 tablet, Rfl: 2  ???  cromolyn (GASTROCROM) 100 mg/5 mL solution, Take 5 mLs (100 mg total) by mouth 4 times daily before meals and at bedtime., Disp: 480 mL, Rfl: 0  ???  cyanocobalamin, vitamin B-12, (VITAMIN B-12 INJ), Inject 0.5 mLs as directed., Disp: , Rfl:   ???  diphenhydrAMINE (BENADRYL) 25 mg capsule, Take 3-6 capsules (75-150 mg total) by mouth daily., Disp: 90 capsule, Rfl: 2  ???  doxepin (SINEQUAN) 10 MG capsule, Take  1 capsule (10 mg total) by mouth at bedtime., Disp: 30 capsule, Rfl: 2  ???  dulaglutide 0.75 mg/0.5 mL PnIj, Inject 0.75 mg subcutaneously every 7 days.,  Disp: 6 mL, Rfl: 3  ???  EASY TOUCH TWIST LANCETS 30 gauge Misc, USE TWICE A DAY AS DIRECTED., Disp: 100 each, Rfl: 11  ???  famotidine (PEPCID) 40 MG tablet, Take 40 mg by mouth 2 times a day., Disp: , Rfl:   ???  fexofenadine (ALLEGRA) 180 MG tablet, Take 1 tablet (180 mg total) by mouth daily., Disp: 30 tablet, Rfl: 3  ???  fluticasone propionate (FLONASE) 50 mcg/actuation nasal spray, Use 1 spray into each nostril daily., Disp: 16 g, Rfl: 1  ???  furosemide (LASIX) 20 MG tablet, Take 1 tablet (20 mg total) by mouth daily., Disp: 90 tablet, Rfl: 3  ???  hydrOXYzine HCL (ATARAX) 25 MG tablet, Take 1 tablet (25 mg total) by mouth every 8 hours as needed for Anxiety. May take additional 25mg  at bedtime., Disp: 120 tablet, Rfl: 2  ???  hyoscyamine (LEVBID) 0.375 mg 12 hr tablet, Take 1 tablet (0.375 mg total) by mouth every 12 hours as needed for Cramping., Disp: 60 tablet, Rfl: 0  ???  lancets Misc, USE TO TEST BLOOD SUGAR 2 TIMES DAILY- Easy touch twist 30g lancets, Disp: 100 each, Rfl: 0  ???  levalbuterol (XOPENEX) 1.25 mg/3 mL nebulizer solution, Inhale 3 mL (1.25 mg total) by nebulization every 6 hours as needed for Wheezing., Disp: 72 mL, Rfl: 0  ???  levothyroxine (SYNTHROID) 137 MCG tablet, Take 1 tablet (137 mcg total) by mouth daily., Disp: 90 tablet, Rfl: 3  ???  metFORMIN (GLUCOPHAGE) 500 MG tablet, TAKE ONE TABLET BY MOUTH TWICE DAILY WITH MEALS, Disp: 60 tablet, Rfl: 0  ???  montelukast (SINGULAIR) 10 mg tablet, Take 1 tablet (10 mg total) by mouth at bedtime., Disp: 30 tablet, Rfl: 2  ???  nebulizers (COMPACT COMPRESSOR NEBULIZER) Misc, Please dispense 1 nebulizer with tubing and mouthpiece., Disp: 1 each, Rfl: 0  ???  omeprazole (PRILOSEC) 40 MG capsule, Take 1 capsule (40 mg total) by mouth 2 times a day before meals., Disp: 60 capsule, Rfl: 5  ???  onabotulinumtoxinA (BOTOX COSMETIC) 50 unit SolR, Inject into the muscle., Disp: , Rfl:   ???  predniSONE (DELTASONE) 10 MG tablet, Take 1 tablet (10 mg total) by mouth daily., Disp:  60 tablet, Rfl: 1  ???  predniSONE (DELTASONE) 20 MG tablet, TAKE ONE TABLET BY MOUTH DAILY., Disp: 30 tablet, Rfl: 0  ???  Ringer's solution,lactated (LACTATED RINGERS) infusion, Intravenous 1,000 mLs once., Disp: , Rfl:   ???  triamcinolone (KENALOG) 0.1 % ointment, APPLY 2 TIMES DAILY, Disp: 80 g, Rfl: 11  ???  TRUE METRIX GLUCOSE TEST STRIP Strp, USE TO TEST BLOOD SUGAR 2 TIMES DAILY, Disp: 50 strip, Rfl: 11  ???  UNABLE TO FIND, COMPOUNDED METHYLCOBALAMIN 1000 MCG EVERY OTHER DAY BY INJECTION/ TRISTATE, Disp: 1000 mL, Rfl: 11  ???  VITAMIN D3-VITAMIN K2, MK4, ORAL, Take by mouth., Disp: , Rfl:     Allergies:  Acetaminophen; Adhesive tape-silicones; Alcohol; Amitriptyline; Aspirin; Benzoic acid; Blue dye; Flecainide; Gabapentin; Iodinated contrast media; Isopropyl alcohol; Latex; Levofloxacin; Lidocaine; Nsaids (non-steroidal anti-inflammatory drug); Onion; Other; Paraben; Perfume; Pregabalin; Preservative; Quetiapine; Red dye; Sulfa (sulfonamide antibiotics); Sulfur; Trazodone; Verapamil; Yellow dye; Green dye; Onion extract; Doxepin; Duloxetine; Epi e-z pen; Hydroxyzine; Statins-hmg-coa reductase inhibitors; and Verapamil (bulk)        REVIEW OF SYSTEMS:  Review of systems reviewed in the chart.    PHYSICAL EXAM:    Could not be performed over the phone.    DATA:     Lab Results   Component Value Date    WBC 9.8 10/19/2018    RBC 4.82 10/19/2018    HGB 13.3 10/19/2018    HCT 40.0 10/19/2018    PLT 386 10/19/2018    MCV 83.0 10/19/2018    MCH 27.6 10/19/2018    MCHC 33.3 10/19/2018    RDW 14.5 10/19/2018    NRBC 0 10/19/2018    LYMPHOPCT 27.0 10/19/2018    MONOPCT 9.7 10/19/2018    EOSPCT 5.4 10/19/2018    BASOPCT 0.6 10/19/2018    MONOSABS 951 (H) 10/19/2018    LYMPHSABS 2,646 10/19/2018    EOSABS 529 (H) 10/19/2018    BASOSABS 59 10/19/2018     Lab Results   Component Value Date    NA 143 10/19/2018    K 4.0 10/19/2018    CL 105 10/19/2018    CO2 26 10/19/2018    BUN 9 10/19/2018    CREATININE 0.58 (L) 10/19/2018     CALCIUM 9.2 10/19/2018    GLUCOSE 113 (H) 10/19/2018     Lab Results   Component Value Date    ALKPHOS 123 10/19/2018    ALT 32 10/19/2018    AST 16 10/28/2016    PROT 7.0 10/19/2018    BILITOT 0.3 10/19/2018     No results found for: PROTIME, INR, WARFARIN  No results found for: LABA1C  Lab Results   Component Value Date    TSH 3.14 10/19/2018     Lab Results   Component Value Date    VITAMINB12 163 (L) 06/09/2017     No results found for: FOLATE  Lab Results   Component Value Date    IRON 73 06/09/2017     Lab Results   Component Value Date    LABIRON 19.5 06/09/2017     Lab Results   Component Value Date    TIBC 375 06/09/2017     Lab Results   Component Value Date    FERRITIN 46.0 06/09/2017     No results found for: ANATITER, ANAPATTRN1, ANA    IMPRESSION/RECOMMENDATIONS:    1.  Epigastric pain likely due to GERD/gastritis:  Increased acidity of the stomach and GERD is one of the GI manifestations of systemic mastocytosis.  Symptoms has improved with omeprazole 40 mg by mouth twice a day and Pepcid 40 mg by mouth twice a day.  Antireflux lifestyle.  I would consider increasing famotidine but the patient continues to have epigastric pain and reflux.  2.  Chronic diarrhea:  Improved with hyoscyamine.  Also likely related to systemic mastocytosis/mast cell activation syndrome.  Continue hyoscyamine 0.375 mg by mouth twice a day when necessary.  Low FODMAP diet.    I would  also consider increasing the dose of Cromolyn from 100 mg by mouth 4 times a day to 200 mg by mouth 4 times a day if the patient developed worsening of her chronic diarrhea .  3.   Abdominal obesity:  MRI of the abdomen did not show any intra-abdominal mass/lesion but noted to have fatty liver and 1 cm liver cyst versus hemangioma.  She was counseled about weight loss and dietary changes.  Likely has metabolic syndrome.  I forwarded to the patient dietary changes for fatty liver and also she is seeing a nutritionist but she needs to  put more  effort in her diet to lose weight.    RTC in 3 months.    The diagnoses and the plan of care was discussed with the patient.   Thank you for giving me the chance to participate in the care of your patient.    Electronically signed by Glenna Fellows, MD on 12/20/2018 at 3:51 PM  This was a Phone conversation, in lieu of an in-person visit. The patient provided verbal consent to participate in the telehealth visit.   I spent 12 minutes speaking with the patient, conducting an interview, performing a limited exam, and educating the patient on my assessment and plan. I also spent 10 minutes, on the same day as the encounter, ordering medications, tests, or procedures, documenting clinical information in the electronic or other health record, independently interpreting results and communicating results to the patient/family/caregiver and providing care coordination .  This note was completely edited, written and reviewed by me and consists of information cut and pasted from the my most recent visit, my smart phrases and other Epic tools. I have personally reviewed all aspects of this note to at least include reviewing this patient's chart and problem list, updating the history, physical exam, lab and procedure results, and assessment and plan as detailed above and below.  As such this visit note reflects my current evaluation and management for this patient.

## 2018-12-20 NOTE — Unmapped (Signed)
Called patient regarding this information. Patient stated that she is not able to get the COVID test due to not having a way. Patient stated an understanding on what she is supposed to do since she's not able to get the test.       Agree with Emily Turner, RN's instructions. ??COVID19 test in Epic - if patient able to come in for a test please help her to schedule test, otherwise supportive care at home with strict precautions - if she notes any dyspnea she should call 911 and go to the ED. ??Should continue to self quarantine for at least 2 weeks.

## 2018-12-20 NOTE — Telephone Encounter (Signed)
Spoke with pt, reschedule pt botox to 01/26/19 due to COVID exposure

## 2018-12-21 ENCOUNTER — Ambulatory Visit
Admit: 2018-12-21 | Discharge: 2018-12-21 | Payer: PRIVATE HEALTH INSURANCE | Attending: Student in an Organized Health Care Education/Training Program

## 2018-12-21 DIAGNOSIS — F419 Anxiety disorder, unspecified: Secondary | ICD-10-CM

## 2018-12-21 MED ORDER — busPIRone (BUSPAR) 10 MG tablet
10 | ORAL_TABLET | Freq: Three times a day (TID) | ORAL | 2 refills | Status: AC
Start: 2018-12-21 — End: 2019-03-29

## 2018-12-21 MED ORDER — hydrOXYzine HCL (ATARAX) 25 MG tablet
25 | ORAL_TABLET | Freq: Three times a day (TID) | ORAL | 2 refills | Status: AC | PRN
Start: 2018-12-21 — End: 2019-03-29

## 2018-12-21 NOTE — Unmapped (Signed)
This was a Video visit, including two-way audio and video communication, in lieu of an in-person visit. I spent 25 minutes speaking with the patient, conducting an interview, performing a limited exam, and educating the patient on my assessment and plan.I have discussed, with Danniella Gonnella the risks associated with appointments using video platforms, which includes the risk of breach of PHI due to technology limitations.  I have also discussed the benefit of getting immediate access to care during this crisis.  The patient provided informed consent for this appointment via Epic/MyChart which includes the use of synchronous audio/video.        Context:??somatic symptoms  Location: Altered mental status of anxiety  Duration: chronic  Severity:??mild  Associated Symptoms: anxiety  Modifying Factors:improving        History of Present Illness:   Emily Bonilla is a 58 y.o. female with PMH unspecified anxiety disorder, atrial fibrillation, WPW, idiopathic mast cell activation, anemia, asthma, hypothyroidism, GERD, intermittent chronic HA, PNES, presents for follow-up. Last seen on/by 09/14/2018 Athanasia Stanwood L. Yobana Culliton, MD.    HPI     Still dealing with some anxiety, but feels that most days have been good or manageable. Feels that 25 out of past 30 days have been good. Feels that buspirone is helpful. Does not use clonazepam every day. Sometimes feels that her sleep disturbed if she takes clonazepam at night a few days in a row, so tries to limit herself. Overall feels that things are going well, all things considered. Her aide is currently out with Covid, and patient is quarantining without any symptoms currently due to being exposed to her aide. Overall mood is positive, anxiety has been manageable despite having little social contact. Occupies herself with baking and cooking. Not stressing herself out as much from her family's medical concerns, establishing better boundaries. Denies SI, HI, AVH.        Patient History:      Psychiatric History:     Prior Inpatient Psychiatric Hospitalization(s): denies  Prior Psychotropic Medications: reports taking prior psychotropic medications.  Current Psychotropic Medications: yes - see below current psychotropic medications.  SA: denies    Past Medical History:   Diagnosis Date   ??? Anaphylaxis    ??? Anemia    ??? Anxiety    ??? Asthma    ??? Atrial fibrillation (CMS Dx)    ??? Bronchitis    ??? Heart disease    ??? Neurological disease    ??? OSA (obstructive sleep apnea)     states resolved   ??? Osteoporosis    ??? Psychogenic nonepileptic seizure    ??? Seizures (CMS Dx)    ??? Spontaneous pneumothorax     states pneumomediatsium/pneumopericardium, self resolved   ??? Thyroid disease    ??? Ulcer    ??? WPW (Wolff-Parkinson-White syndrome)        Past Surgical History:   Procedure Laterality Date   ??? BONE MARROW BIOPSY      benign   ??? CARDIAC ELECTROPHYSIOLOGY MAPPING AND ABLATION      attempted x 2, states not successful either time   ??? CHOLECYSTECTOMY  2015   ??? DENTAL EXTRACTION Right 04/25/2017    Procedure: EXTRACTION TOOTH # 31, BRIDGE RESECTIONING;  Surgeon: Gladys Damme, DMD;  Location: [facility] OR;  Service: Oral Surgery;  Laterality: Right;   ??? FACIAL COSMETIC SURGERY     ??? HYSTERECTOMY  2009   ??? INSERTION CATHETER VASCULAR ACCESS N/A 09/06/2017    Procedure: INSERTION POWER PORT A CATH;  Surgeon: Penni Bombard, MD;  Location: [facility] OR;  Service: General;  Laterality: N/A;   ??? LIVER BIOPSY      benign hepatic adednoma per patient   ??? REMOVAL HARDWARE LEG Left 08/17/2016    Procedure: REMOVAL OF HARDWARE LEFT TIBIA;  Surgeon: Andee Lineman, MD;  Location: [facility] OR;  Service: Orthopedics;  Laterality: Left;   ??? TIBIA FRACTURE SURGERY     ??? TONSILLECTOMY      + adenoids       Social History     Socioeconomic History   ??? Marital status: Divorced     Spouse name: Not on file   ??? Number of children: Not on file   ??? Years of education: Not on file   ??? Highest education level: Not on file   Occupational History   ??? Not on file    Social Needs   ??? Financial resource strain: Not on file   ??? Food insecurity     Worry: Not on file     Inability: Not on file   ??? Transportation needs     Medical: Not on file     Non-medical: Not on file   Tobacco Use   ??? Smoking status: Never Smoker   ??? Smokeless tobacco: Never Used   Substance and Sexual Activity   ??? Alcohol use: No     Comment: Denies (11/15/2016)   ??? Drug use: No     Comment: Denies (11/15/2016)   ??? Sexual activity: Not Currently   Lifestyle   ??? Physical activity     Days per week: Not on file     Minutes per session: Not on file   ??? Stress: Not on file   Relationships   ??? Social Wellsite geologist on phone: Not on file     Gets together: Not on file     Attends religious service: Not on file     Active member of club or organization: Not on file     Attends meetings of clubs or organizations: Not on file     Relationship status: Not on file   ??? Intimate partner violence     Fear of current or ex partner: Not on file     Emotionally abused: Not on file     Physically abused: Not on file     Forced sexual activity: Not on file   Other Topics Concern   ??? Caffeine Use Yes   ??? Occupational Exposure Not Asked   ??? Exercise No   ??? Seat Belt Yes   Social History Narrative   ??? Not on file       Family History   Problem Relation Age of Onset   ??? Kidney failure Mother    ??? Aneurysm Mother        Review of Systems   Constitutional: Negative for chills and fever.   HENT: Negative for congestion.    Eyes: Negative for visual disturbance.   Respiratory: Negative for cough.    Cardiovascular: Negative for chest pain.   Gastrointestinal: Negative for abdominal distention, abdominal pain, diarrhea, heartburn and vomiting.   Genitourinary: Negative for dysuria.   Musculoskeletal: Negative for back pain and myalgias.   Skin: Negative for rash.   Neurological: Negative for headaches.       Allergies:   Acetaminophen; Adhesive tape-silicones; Alcohol; Amitriptyline; Aspirin; Benzoic acid; Blue dye; Flecainide;  Gabapentin; Iodinated contrast media; Isopropyl alcohol; Latex; Levofloxacin; Lidocaine; Nsaids (non-steroidal anti-inflammatory drug);  Onion; Other; Paraben; Perfume; Pregabalin; Preservative; Quetiapine; Red dye; Sulfa (sulfonamide antibiotics); Sulfur; Trazodone; Verapamil; Yellow dye; Green dye; Onion extract; Doxepin; Duloxetine; Epi e-z pen; Hydroxyzine; Statins-hmg-coa reductase inhibitors; and Verapamil (bulk)    Medications:     Outpatient Encounter Medications as of 12/21/2018   Medication Sig Dispense Refill   ??? blood-glucose meter Misc Use meter to test blood glucose twice daily 1 each 0   ??? budesonide DR (ENTOCORT EC) 3 mg 24 hr capsule TAKE 3 CAPSULES BY MOUTH EVERY MORNING 90 capsule 0   ??? [EXPIRED] busPIRone (BUSPAR) 10 MG tablet Take 2 tablets (20 mg total) by mouth 3 times a day. 180 tablet 2   ??? cetirizine (ZYRTEC) 10 mg Cap Take 10 mg by mouth daily. 30 capsule 2   ??? clonazePAM (KLONOPIN) 0.5 MG tablet Take 1 tablet (0.5 mg total) by mouth 2 times a day as needed for Anxiety. 60 tablet 2   ??? cromolyn (GASTROCROM) 100 mg/5 mL solution Take 5 mLs (100 mg total) by mouth 4 times daily before meals and at bedtime. 480 mL 0   ??? cyanocobalamin, vitamin B-12, (VITAMIN B-12 INJ) Inject 0.5 mLs as directed.     ??? diphenhydrAMINE (BENADRYL) 25 mg capsule Take 3-6 capsules (75-150 mg total) by mouth daily. 90 capsule 2   ??? doxepin (SINEQUAN) 10 MG capsule Take 1 capsule (10 mg total) by mouth at bedtime. 30 capsule 2   ??? dulaglutide 0.75 mg/0.5 mL PnIj Inject 0.75 mg subcutaneously every 7 days. 6 mL 3   ??? EASY TOUCH TWIST LANCETS 30 gauge Misc USE TWICE A DAY AS DIRECTED. 100 each 11   ??? famotidine (PEPCID) 40 MG tablet Take 40 mg by mouth 2 times a day.     ??? fexofenadine (ALLEGRA) 180 MG tablet Take 1 tablet (180 mg total) by mouth daily. 30 tablet 3   ??? fluticasone propionate (FLONASE) 50 mcg/actuation nasal spray Use 1 spray into each nostril daily. 16 g 1   ??? furosemide (LASIX) 20 MG tablet Take 1  tablet (20 mg total) by mouth daily. 90 tablet 3   ??? hydrOXYzine HCL (ATARAX) 25 MG tablet Take 1 tablet (25 mg total) by mouth every 8 hours as needed for Anxiety. May take additional 25mg  at bedtime. 120 tablet 2   ??? hyoscyamine (LEVBID) 0.375 mg 12 hr tablet Take 1 tablet (0.375 mg total) by mouth every 12 hours as needed for Cramping. 60 tablet 0   ??? lancets Misc USE TO TEST BLOOD SUGAR 2 TIMES DAILY- Easy touch twist 30g lancets 100 each 0   ??? levalbuterol (XOPENEX) 1.25 mg/3 mL nebulizer solution Inhale 3 mL (1.25 mg total) by nebulization every 6 hours as needed for Wheezing. 72 mL 0   ??? levothyroxine (SYNTHROID) 137 MCG tablet Take 1 tablet (137 mcg total) by mouth daily. 90 tablet 3   ??? metFORMIN (GLUCOPHAGE) 500 MG tablet TAKE ONE TABLET BY MOUTH TWICE DAILY WITH MEALS 60 tablet 0   ??? montelukast (SINGULAIR) 10 mg tablet Take 1 tablet (10 mg total) by mouth at bedtime. 30 tablet 2   ??? nebulizers (COMPACT COMPRESSOR NEBULIZER) Misc Please dispense 1 nebulizer with tubing and mouthpiece. 1 each 0   ??? omeprazole (PRILOSEC) 40 MG capsule Take 1 capsule (40 mg total) by mouth 2 times a day before meals. 60 capsule 5   ??? onabotulinumtoxinA (BOTOX COSMETIC) 50 unit SolR Inject into the muscle.     ??? predniSONE (DELTASONE) 10 MG tablet  Take 1 tablet (10 mg total) by mouth daily. 60 tablet 1   ??? predniSONE (DELTASONE) 20 MG tablet TAKE ONE TABLET BY MOUTH DAILY. 30 tablet 0   ??? Ringer's solution,lactated (LACTATED RINGERS) infusion Intravenous 1,000 mLs once.     ??? triamcinolone (KENALOG) 0.1 % ointment APPLY 2 TIMES DAILY 80 g 11   ??? TRUE METRIX GLUCOSE TEST STRIP Strp USE TO TEST BLOOD SUGAR 2 TIMES DAILY 50 strip 11   ??? UNABLE TO FIND COMPOUNDED METHYLCOBALAMIN 1000 MCG EVERY OTHER DAY BY INJECTION/ TRISTATE 1000 mL 11   ??? VITAMIN D3-VITAMIN K2, MK4, ORAL Take by mouth.     ??? [DISCONTINUED] levothyroxine (SYNTHROID) 137 MCG tablet Take 1 tablet (137 mcg total) by mouth daily. 90 tablet 3     No  facility-administered encounter medications on file as of 12/21/2018.         Outpatient Medications Prior to Visit   Medication Sig Dispense Refill   ??? blood-glucose meter Misc Use meter to test blood glucose twice daily 1 each 0   ??? budesonide DR (ENTOCORT EC) 3 mg 24 hr capsule TAKE 3 CAPSULES BY MOUTH EVERY MORNING 90 capsule 0   ??? cetirizine (ZYRTEC) 10 mg Cap Take 10 mg by mouth daily. 30 capsule 2   ??? clonazePAM (KLONOPIN) 0.5 MG tablet Take 1 tablet (0.5 mg total) by mouth 2 times a day as needed for Anxiety. 60 tablet 2   ??? cromolyn (GASTROCROM) 100 mg/5 mL solution Take 5 mLs (100 mg total) by mouth 4 times daily before meals and at bedtime. 480 mL 0   ??? cyanocobalamin, vitamin B-12, (VITAMIN B-12 INJ) Inject 0.5 mLs as directed.     ??? diphenhydrAMINE (BENADRYL) 25 mg capsule Take 3-6 capsules (75-150 mg total) by mouth daily. 90 capsule 2   ??? doxepin (SINEQUAN) 10 MG capsule Take 1 capsule (10 mg total) by mouth at bedtime. 30 capsule 2   ??? dulaglutide 0.75 mg/0.5 mL PnIj Inject 0.75 mg subcutaneously every 7 days. 6 mL 3   ??? EASY TOUCH TWIST LANCETS 30 gauge Misc USE TWICE A DAY AS DIRECTED. 100 each 11   ??? famotidine (PEPCID) 40 MG tablet Take 40 mg by mouth 2 times a day.     ??? fexofenadine (ALLEGRA) 180 MG tablet Take 1 tablet (180 mg total) by mouth daily. 30 tablet 3   ??? fluticasone propionate (FLONASE) 50 mcg/actuation nasal spray Use 1 spray into each nostril daily. 16 g 1   ??? furosemide (LASIX) 20 MG tablet Take 1 tablet (20 mg total) by mouth daily. 90 tablet 3   ??? hydrOXYzine HCL (ATARAX) 25 MG tablet Take 1 tablet (25 mg total) by mouth every 8 hours as needed for Anxiety. May take additional 25mg  at bedtime. 120 tablet 2   ??? hyoscyamine (LEVBID) 0.375 mg 12 hr tablet Take 1 tablet (0.375 mg total) by mouth every 12 hours as needed for Cramping. 60 tablet 0   ??? lancets Misc USE TO TEST BLOOD SUGAR 2 TIMES DAILY- Easy touch twist 30g lancets 100 each 0   ??? levalbuterol (XOPENEX) 1.25 mg/3 mL  nebulizer solution Inhale 3 mL (1.25 mg total) by nebulization every 6 hours as needed for Wheezing. 72 mL 0   ??? levothyroxine (SYNTHROID) 137 MCG tablet Take 1 tablet (137 mcg total) by mouth daily. 90 tablet 3   ??? metFORMIN (GLUCOPHAGE) 500 MG tablet TAKE ONE TABLET BY MOUTH TWICE DAILY WITH MEALS 60 tablet 0   ???  montelukast (SINGULAIR) 10 mg tablet Take 1 tablet (10 mg total) by mouth at bedtime. 30 tablet 2   ??? nebulizers (COMPACT COMPRESSOR NEBULIZER) Misc Please dispense 1 nebulizer with tubing and mouthpiece. 1 each 0   ??? omeprazole (PRILOSEC) 40 MG capsule Take 1 capsule (40 mg total) by mouth 2 times a day before meals. 60 capsule 5   ??? onabotulinumtoxinA (BOTOX COSMETIC) 50 unit SolR Inject into the muscle.     ??? predniSONE (DELTASONE) 10 MG tablet Take 1 tablet (10 mg total) by mouth daily. 60 tablet 1   ??? predniSONE (DELTASONE) 20 MG tablet TAKE ONE TABLET BY MOUTH DAILY. 30 tablet 0   ??? Ringer's solution,lactated (LACTATED RINGERS) infusion Intravenous 1,000 mLs once.     ??? triamcinolone (KENALOG) 0.1 % ointment APPLY 2 TIMES DAILY 80 g 11   ??? TRUE METRIX GLUCOSE TEST STRIP Strp USE TO TEST BLOOD SUGAR 2 TIMES DAILY 50 strip 11   ??? UNABLE TO FIND COMPOUNDED METHYLCOBALAMIN 1000 MCG EVERY OTHER DAY BY INJECTION/ TRISTATE 1000 mL 11   ??? VITAMIN D3-VITAMIN K2, MK4, ORAL Take by mouth.       No facility-administered medications prior to visit.        Objective:   Physical Exam  There were no vitals filed for this visit.    Gait and Stations: Normal    Mental Status Exam:   Appearance: appears stated age, casually dressed, grooming and hygiene fair  Behavior/Movement: polite, cooperative.   Speech: regular rate, rhythm, tone and prosody.  Naming and repeating intact.  No aphasia. Production within normal limit. Fluent and nonpressured.  Mood:  good  Affect: euthymic, full range, mood congruent, content appropriate   Thought Process: organized and linear without any flight of ideas or loosening of  associations.  Thought Content: Denies suicidal ideation, denies homicidal ideation. No ruminations or obsessions, no erotomanic, grandiose, persecutory, or bizarre delusions were noted.   Perceptions: Denies auditory hallucinations, denies visual hallucinations. Not seen responding to internal stimuli on camera.   Cognition:   -Sensorium: alert   -Orientation: oriented to person, place, situation and year   -Memory: grossly intact    -Fund Of Knowledge: appears consistent with average intellect based on vocabulary,    -Insight: good    -Judgement: good    PHQ-9 Scores:   PHQ Total Score 03/30/2016 05/21/2016 06/24/2016 08/26/2016 09/29/2017   PHQ-9 Total Score 0 0 0 9 7       GAD7 Scores:   GAD7Total Score 08/26/2016 11/25/2016 02/03/2017 04/14/2017 09/29/2017   GAD-7 Total Score 10 6 2 6 4           Assessment/Plan:   58 y.o. Caucasian female, history of  unspecified anxiety disorder, atrial fibrillation, WPW, idiopathic mast cell activation, anemia, asthma, hypothyroidism, GERD, intermittent chronic HA, PNES, presents for follow up and to establish with new provider. Reports manageable anxiety with current regimen, interval improvements noted. Given significant anaphylaxis history, will continue current regimen without modification. Slight decrease in number of klonopin tablets dispensed.    Diagnoses:  -Somatic symptom disorder  -Unspecified anxiety disorder, moderate  -Cluster B personality traits        # Anxiety, moderate  -Continue Buspirone 20 mg TID for anxiety  -Continue Hydroxyzine 25 mg BID, 50mg  QHS for anxiety  -Klonopin 0.5mg  BID, #50 tablets, 1RF.  -Continue treatment with integrative medicine for biofeedback, massage therapy and acupuncture  -Self-management: meditation, yoga  -RTC in  weeks via video  ??  Discussed  risks/benefits/side effects/alternatives to medications. Discussed the risk of cognitive decline with long-term use of anti-histamine. Patient expressed understanding of and agreement to the current  treatment plan.   ??  # Safety: The patient's imminent risk appears low, as she denies SI, does not express HI, no previous SA, future-oriented and goal-oriented to continue treatment. Patient expressed understanding of resources, including 911 and PES.  ??  # Substance:  -No active issues  ??  # Medical:  -Follow-up with PCP      Elvera Bicker, MD  Psychiatry PGY-3    Patient plan discussed and agreed upon by attending Dr. Thomasena Edis    Portions of this note were copied forward from a previous note, changes have been made throughout to accurately reflect elements obtained from today's evaluation.

## 2018-12-22 ENCOUNTER — Ambulatory Visit: Payer: PRIVATE HEALTH INSURANCE | Attending: Family

## 2019-01-08 NOTE — Telephone Encounter (Signed)
Pt calling to report green nasal drainage, oral lesions, inner ear problem, fatigue, dry cough, increased blood sugar, and low-grade temps (TMAX 99).     Has had recent COVID exposure by [facility] aid and has not other viral sx, but has not been tested.    She would like to speak with Dr. Welton Flakes regarding sx.

## 2019-01-09 MED ORDER — dulaglutide 1.5 mg/0.5 mL PnIj
1.5 | SUBCUTANEOUS | 3 refills | Status: AC
Start: 2019-01-09 — End: 2019-10-19

## 2019-01-15 MED ORDER — omeprazole (PRILOSEC) 40 MG capsule
40 | ORAL_CAPSULE | Freq: Two times a day (BID) | ORAL | 1 refills | Status: AC
Start: 2019-01-15 — End: 2019-01-29

## 2019-01-15 NOTE — Telephone Encounter (Signed)
Patient is requesting a 90 day supply and send to [facility]

## 2019-01-22 NOTE — Telephone Encounter (Signed)
Pt called with concerns regarding AVS from 10/1. Pt is concerned about cromolyn prescription and states that refill was not approved and she was not informed why. Pt is upset regarding care. States she will be bring in previous medical records for MD to review.    Please follow-up.

## 2019-01-23 NOTE — Telephone Encounter (Signed)
Called and spoke patient.    Patient is in need of another refill.  There were no refills on her prescription in October. Did change the 0 refills to 11.   Patient was concerned the physician wanted her to take the medication differently or was not sure if she was to be weaned off the medication.   Patient states she is still having some GI issues with dropping her dose from 10 ml to 5 ml.   Patient states she has seen a GI physician, who has placed her on both Pepcid and Prilosec 40 mg each.   Patient stated she was on anti-spasmatic which caused her to have some flushing and bloating.   Patient did not do well on this medication.      Patient is completely out of this medication and desepartly needs a refill.

## 2019-01-23 NOTE — Telephone Encounter (Signed)
Called and spoke with patient regarding this medication.  Patient needing a refill of the Cromolyn. Called and spoke with pharmacist.   Pharmacist did not think she needed a PA on this medication but definitely needed a refill.    Refill request placed on the refill request form.    Patient would like the physician to call her if she needs to take this medication differently.   Also place order request for 11 refills   Orders from 10/19/18 had no refills.

## 2019-01-24 NOTE — Telephone Encounter (Signed)
Called [facility] and spoke with Misty Stanley. They are requesting orders for Hydration. Notified them that I would ask MD tomorrow when he is present.

## 2019-01-24 NOTE — Unmapped (Addendum)
January 24, 2019 2:03 PM    Misty Stanley with [facility] called to request the following order:     ?? Hydration    Please fax to # [phone]    [facility]: # [phone]

## 2019-01-25 MED ORDER — cromolyn (GASTROCROM) 100 mg/5 mL solution
100 | Freq: Four times a day (QID) | ORAL | 0 refills | 24.00000 days | Status: AC
Start: 2019-01-25 — End: 2019-04-06

## 2019-01-25 NOTE — Telephone Encounter (Signed)
Called patient back about  Cromolyn prescription.   Patient did initally receive it from her PCP d/t the long wait time to get into allergy clinic.  Since then it has been allergy clinic who has been ordering it for.  Patient states it has always been allergy who has written for this prescription.  (please check medication history Dr. Nicholes Stairs did write for this prescription.   Patient states she will need enough to get to April as this is when her next appointment will be. Patient stated she will be happy to talk with you about this medication in April.

## 2019-01-25 NOTE — Telephone Encounter (Signed)
Called [facility] and notified them that Dr. Lourdes Sledge will not be writing any further orders for hydration. He states he had discussed this with patient at last visit. Left a message for Misty Stanley.

## 2019-01-26 ENCOUNTER — Ambulatory Visit: Admit: 2019-01-26 | Payer: PRIVATE HEALTH INSURANCE | Attending: Family

## 2019-01-26 DIAGNOSIS — G43719 Chronic migraine without aura, intractable, without status migrainosus: Secondary | ICD-10-CM

## 2019-01-26 MED ORDER — botulinum toxin A (BOTOX) 100 unit injection
100 | INTRAMUSCULAR | Status: AC
Start: 2019-01-26 — End: 2019-01-26

## 2019-01-26 MED ORDER — cromolyn (GASTROCROM) 100 mg/5 mL solution
100 | Freq: Four times a day (QID) | ORAL | 11 refills | 24.00000 days | Status: AC
Start: 2019-01-26 — End: 2020-05-07

## 2019-01-26 MED ORDER — sodium chloride 0.9%
INTRAMUSCULAR | Status: AC
Start: 2019-01-26 — End: 2019-01-26

## 2019-01-26 MED ORDER — botulinum toxin A (BOTOX) injection 200 Units
100 | Freq: Once | INTRAMUSCULAR | Status: AC
Start: 2019-01-26 — End: 2019-01-26
  Administered 2019-01-26: 15:00:00 200 [IU] via INTRAMUSCULAR

## 2019-01-26 MED FILL — BOTOX 100 UNIT INJECTION: 100 100 unit | INTRAMUSCULAR | Qty: 2

## 2019-01-26 MED FILL — SODIUM CHLORIDE 0.9 % INJECTION SOLUTION: INTRAMUSCULAR | Qty: 10

## 2019-01-26 NOTE — Unmapped (Signed)
MRN:  [id number]  Date: 01/26/2019   Emily Bonilla is a 59 y.o. female who comes in for scheduled Botox injection.      BOTOX PREEMPT PROTOCOL     The risks, benefits and anticipated outcomes of the procedure, the risks and benefits of the alternatives to the procedure, and the roles and tasks of the personnel to be involved, were discussed with the patient.     The side effects of onabotulinum toxin used for the treatment of chronic migraine have been described to the patient in detail.  These include: ptosis, neck pain, neck weakness, anaphylaxis, cellulitis, systemic spread (death, respiratory failure, dysarthria, dysphagia, diplopia), blood born illnesses (HIV, hepatitis, mad cow disease).   The patient is aware that any side effects related to muscle paralysis could last for 3 months.     The patient has given written informed consent to the procedure and agrees to proceed.Yes     Treatment # 7   Dilution: 5 units/0.1 ml (200 U of botox was mixed with 4 ccs of bacteriostatic saline.)  Indication: Chronic Migraine        Injection Sites:  0.1 ccs of this solution was injected into the following:  Muscle    Fixed Site/Fixed Dose  L      R   Corrugator??????????????????????????????????????10 Units divided in 2 sites??????????????????????????????????????????  ??  Procerus ??????????????????????????????????????????5 Units in 1 site ????????????????????????????????????????????????????????????????????????  ??  Frontalis????????????????????????????????????????????20 Units divided in 4 sites????????????????????????????????????????????  ??  Temporalis ??????????????????????????????????40 Units divided in 8 sites ????????????????????????????????????????  ??  Occipitalis ??????????????????????????????????????30 Units divided in 6 sites ????????????????????????????????????????  ??  Cervical PSPs??????????????????????????20 Units divided in 4 sites??????????????????????????????????????????  ??  Trapezius????????????????????????????????????????30 Units divided in 6 sites???????????????????????????????????? 22.5 units extra left trapezius in 5 divided doses  ???????????????????????????????????????????????????????????????????????????????????????????????????????????????????????????????????????????????????????????????????????????????????????????? ??????????22.5 units extra right trapezius in 5 divided  doses?????????????????????????????????????????????????????????????????????????????????????????????????????????????????????????????????????????????????????????????????????????? ??????  ??  ??  Total Units used:200??  Total Units wasted:0??  Patient??did??tolerate procedure.   ??  Change in Treatment Plan?no    RTC: 3 Months, For Botox and PRN    Stefan Church, CNP

## 2019-01-29 MED ORDER — omeprazole (PRILOSEC) 40 MG capsule
40 | ORAL_CAPSULE | ORAL | 5 refills | Status: AC
Start: 2019-01-29 — End: 2019-10-12

## 2019-02-06 NOTE — Unmapped (Signed)
Pt called to report that she is needing omeprozole to be sent to [facility]. Not [facility]       [facility] [place], [place] - [address]  [address] [place] [address]  Phone: [phone]

## 2019-02-12 NOTE — Telephone Encounter (Signed)
Marene Lenz RN w/ [facility] called to inform MD of locating a specialist at [facility] to consult with for mast cell complex immunodeficiency disorder.    Audelia Hives   Fax number [phone]   Phone number [phone]    Any questions Ronaldo Miyamoto can be reached at [phone] x [phone]

## 2019-02-13 NOTE — Telephone Encounter (Signed)
Faxed referral to number given. Esperanza Richters

## 2019-02-22 MED ORDER — doxepin (SINEQUAN) 10 MG capsule
10 | ORAL_CAPSULE | ORAL | 0 refills | Status: AC
Start: 2019-02-22 — End: ?

## 2019-02-23 NOTE — Unmapped (Signed)
Pt called requesting to speak with staff regarding medications. Pt stated she is completely out of the two listed below, but due to Dr. Welton Flakes not being able to prescribe medications the medications are sent to Dr. Brynda Peon. When Dr. Brynda Peon signs the scripts the medication is booted out of the pharmacy's system due to pt being Dr. Milta Deiters pt.     Caller requests return call at [phone].         Pt requesting prescription refill.       MEDICATIONS REQUESTED:     Requested Prescriptions     Pending Prescriptions Disp Refills   ??? doxepin (SINEQUAN) 10 MG capsule 30 capsule 0   ??? montelukast (SINGULAIR) 10 mg tablet 30 tablet 2     Sig: Take 1 tablet (10 mg total) by mouth at bedtime.         Last prescription date: mult  Is the patient out of medication? Yes    PHARMACY & PHONE #:     [facility] [place], [place] - [address]  [address] [place] [address]  Phone: [phone]           DATE OF LAST APPT: 10/19/2018    DATE OF NEXT APPT: 04/19/2019

## 2019-02-26 MED ORDER — montelukast (SINGULAIR) 10 mg tablet
10 | ORAL_TABLET | ORAL | 0 refills | Status: AC
Start: 2019-02-26 — End: ?

## 2019-03-05 MED ORDER — TRUE METRIX GLUCOSE TEST STRIP Strp
ORAL_STRIP | 11 refills | 30.00000 days | Status: AC
Start: 2019-03-05 — End: 2020-03-31

## 2019-03-05 MED ORDER — clonazePAM (KLONOPIN) 0.5 MG tablet
0.5 | ORAL_TABLET | Freq: Two times a day (BID) | ORAL | 2 refills | Status: AC | PRN
Start: 2019-03-05 — End: 2019-03-29

## 2019-03-05 NOTE — Unmapped (Signed)
Last office visit with this provider: 10/12/2017 Loyal Jacobson, CNP  Last office visit at this location and with whom: 11/10/2017 Orbie Pyo, MD  Next scheduled office visit at this location and with whom: 05/17/2019 Orbie Pyo, MD

## 2019-03-20 MED ORDER — furosemide (LASIX) 20 MG tablet
20 | ORAL_TABLET | Freq: Every day | ORAL | 0 refills | Status: AC
Start: 2019-03-20 — End: 2019-05-24

## 2019-03-20 NOTE — Unmapped (Signed)
Last visit 10/16/2018 Court Joy, MD  Future Appointments   Date Time Provider Department Center   03/29/2019  1:45 PM Daryll Brod L. Boothe, MD [facility] PSY OP OP   04/19/2019  9:40 AM Desmond Dike, MD [facility] ALG HOX HOX   04/27/2019 10:00 AM Stefan Church, CNP [facility] NEUR GNI UCGNI   05/17/2019 10:00 AM Orbie Pyo, MD [facility] MAB MAB     Requested Prescriptions     Pending Prescriptions Disp Refills   ??? furosemide (LASIX) 20 MG tablet [Pharmacy Med Name: FUROSEMIDE 20 MG TAB] 30 tablet 0     Sig: TAKE ONE TABLET BY MOUTH ONCE A DAY.       Pain Flowsheet (Urine Drug/OARRS-eKASPER/Narcotic) 09/26/2016 03/21/2018 09/14/2018   OARRS/eKASPER Status Reviewed Reviewed Reviewed   OARRS/eKASPER Consistent with Prescriber Expectation Y Malvin Johns        Allergies and pharmacy verified.     Forwarding to provider for approval or denial

## 2019-03-29 ENCOUNTER — Ambulatory Visit
Admit: 2019-03-29 | Discharge: 2019-03-29 | Payer: PRIVATE HEALTH INSURANCE | Attending: Student in an Organized Health Care Education/Training Program

## 2019-03-29 DIAGNOSIS — F419 Anxiety disorder, unspecified: Secondary | ICD-10-CM

## 2019-03-29 MED ORDER — busPIRone (BUSPAR) 10 MG tablet
10 | ORAL_TABLET | Freq: Three times a day (TID) | ORAL | 2 refills | Status: AC
Start: 2019-03-29 — End: 2019-07-03

## 2019-03-29 MED ORDER — hydrOXYzine HCL (ATARAX) 25 MG tablet
25 | ORAL_TABLET | Freq: Three times a day (TID) | ORAL | 2 refills | Status: AC | PRN
Start: 2019-03-29 — End: 2019-07-12

## 2019-03-29 MED ORDER — clonazePAM (KLONOPIN) 0.5 MG tablet
0.5 | ORAL_TABLET | Freq: Three times a day (TID) | ORAL | 2 refills | Status: AC | PRN
Start: 2019-03-29 — End: 2019-04-19

## 2019-03-29 NOTE — Unmapped (Signed)
This was a Video visit, including two-way audio and video communication, in lieu of an in-person visit.I have discussed, with Minyon Carr the risks associated with appointments using video platforms, which includes the risk of breach of PHI due to technology limitations.  I have also discussed the benefit of getting immediate access to care during this crisis.  The patient provided informed consent for this appointment via Epic/MyChart which includes the use of synchronous audio/video.      I spent 25 minutes speaking with the patient, conducting an interview, performing a limited exam, and educating the patient on my assessment and plan. I also spent 15 minutes, on the same day as the encounter, preparing to see the patient (eg, review of tests), ordering medications, tests, or procedures and documenting clinical information in the electronic or other health record.    Context:??somatic symptoms  Location: Altered mental status of anxiety  Duration: chronic  Severity:??mild  Associated Symptoms: anxiety  Modifying Factors:improving        History of Present Illness:   [name] is a 59 y.o. female with PMH unspecified anxiety disorder, atrial fibrillation, WPW, idiopathic mast cell activation, anemia, asthma, hypothyroidism, GERD, intermittent chronic HA, PNES, presents for follow-up. Last seen on/by 12/21/2018 Joselynn Amoroso L. Jarvis Knodel, MD.    HPI     Still having frustrating dreams that leave her exhausted intermittently.  Feels like she is working hard in her dreams, feels the exhaustion when she wakes up. Describes them as not being nightmares, are not scary in nature. Has been working with a new allergist at [facility] [place] who specializes in mass cell disorders, excited about making some progress simplifying her allergy medications and hopefully some of the side effects these medications bring. Has a sewing machine, wants to start working on quilts for her grandchildren. Bought an elliptical, has been trying to  regain some of her strength. Currently using it for 1.5 minutes at a time multiple times a day. No COVID exposures, aide has recovered from her illness. Mood has been mostly positive. Denies depression, feelings of hopelessness or worthlessness, or guilt. No symptoms of mania or psychosis. Anxiety remains present from time to time, but overall has slowly improved some with recent activities and use of coping strategies. Denies SI, HI, AVH.    Patient History:     Review of Systems   Constitutional: Negative for chills and fever.   HENT: Negative for congestion.    Eyes: Negative for visual disturbance.   Respiratory: Negative for cough.    Cardiovascular: Negative for chest pain.   Gastrointestinal: Negative for abdominal distention, abdominal pain, diarrhea, heartburn and vomiting.   Genitourinary: Negative for dysuria.   Musculoskeletal: Negative for back pain and myalgias.   Skin: Negative for rash.   Neurological: Negative for headaches.       Allergies:   Acetaminophen, Adhesive tape-silicones, Alcohol, Amitriptyline, Aspirin, Benzoic acid, Blue dye, Flecainide, Gabapentin, Iodinated contrast media, Isopropyl alcohol, Latex, Levofloxacin, Lidocaine, Nsaids (non-steroidal anti-inflammatory drug), Onion, Other, Paraben, Perfume, Pregabalin, Preservative, Quetiapine, Red dye, Sulfa (sulfonamide antibiotics), Sulfur, Trazodone, Verapamil, Yellow dye, Green dye, Onion extract, Doxepin, Duloxetine, Epi e-z pen, Hydroxyzine, Statins-hmg-coa reductase inhibitors, and Verapamil (bulk)    Medications:     Outpatient Medications Prior to Visit   Medication Sig Dispense Refill   ??? blood-glucose meter Misc Use meter to test blood glucose twice daily 1 each 0   ??? cetirizine (ZYRTEC) 10 mg Cap Take 10 mg by mouth daily. 30 capsule 2   ??? clonazePAM (  KLONOPIN) 0.5 MG tablet Take 2 tablets (1 mg total) by mouth 2 times a day as needed for Anxiety. 60 tablet 2   ??? cromolyn (GASTROCROM) 100 mg/5 mL solution Take 5 mLs (100 mg  total) by mouth 4 times daily before meals and at bedtime. 480 mL 11   ??? cromolyn (GASTROCROM) 100 mg/5 mL solution Take 5 mLs (100 mg total) by mouth 4 times daily before meals and at bedtime. 480 mL 0   ??? cyanocobalamin, vitamin B-12, (VITAMIN B-12 INJ) Inject 0.5 mLs as directed.     ??? diphenhydrAMINE (BENADRYL) 25 mg capsule Take 3-6 capsules (75-150 mg total) by mouth daily. 90 capsule 2   ??? doxepin (SINEQUAN) 10 MG capsule TAKE ONE (1) CAPSULE BY MOUTH AT BEDTIME. 30 capsule 0   ??? dulaglutide 1.5 mg/0.5 mL PnIj Inject 1.5 mg subcutaneously every 7 days. 6 mL 3   ??? EASY TOUCH TWIST LANCETS 30 gauge Misc USE TWICE A DAY AS DIRECTED. 100 each 11   ??? famotidine (PEPCID) 40 MG tablet Take 40 mg by mouth 2 times a day.     ??? fexofenadine (ALLEGRA) 180 MG tablet Take 1 tablet (180 mg total) by mouth daily. 30 tablet 3   ??? fluticasone propionate (FLONASE) 50 mcg/actuation nasal spray Use 1 spray into each nostril daily. 16 g 1   ??? furosemide (LASIX) 20 MG tablet Take 1 tablet (20 mg total) by mouth daily. 30 tablet 0   ??? hydrOXYzine HCL (ATARAX) 25 MG tablet Take 1 tablet (25 mg total) by mouth every 8 hours as needed for Anxiety. May take additional 25mg  at bedtime. 120 tablet 2   ??? hyoscyamine (LEVBID) 0.375 mg 12 hr tablet Take 1 tablet (0.375 mg total) by mouth every 12 hours as needed for Cramping. 60 tablet 0   ??? lancets Misc USE TO TEST BLOOD SUGAR 2 TIMES DAILY- Easy touch twist 30g lancets 100 each 0   ??? levalbuterol (XOPENEX) 1.25 mg/3 mL nebulizer solution Inhale 3 mL (1.25 mg total) by nebulization every 6 hours as needed for Wheezing. 72 mL 0   ??? levothyroxine (SYNTHROID) 137 MCG tablet Take 1 tablet (137 mcg total) by mouth daily. 90 tablet 3   ??? montelukast (SINGULAIR) 10 mg tablet TAKE ONE TABLET BY MOUTH AT BEDTIME. 30 tablet 0   ??? nebulizers (COMPACT COMPRESSOR NEBULIZER) Misc Please dispense 1 nebulizer with tubing and mouthpiece. 1 each 0   ??? omeprazole (PRILOSEC) 40 MG capsule TAKE 1 CAPSULE BY  MOUTH 2 TIMES DAILY BEFORE MEALS 60 capsule 5   ??? onabotulinumtoxinA (BOTOX COSMETIC) 50 unit SolR Inject into the muscle.     ??? predniSONE (DELTASONE) 10 MG tablet Take 1 tablet (10 mg total) by mouth daily. 60 tablet 1   ??? predniSONE (DELTASONE) 20 MG tablet TAKE ONE TABLET BY MOUTH DAILY. 30 tablet 0   ??? Ringer's solution,lactated (LACTATED RINGERS) infusion Intravenous 1,000 mLs once.     ??? triamcinolone (KENALOG) 0.1 % ointment APPLY 2 TIMES DAILY 80 g 11   ??? TRUE METRIX GLUCOSE TEST STRIP Strp USE TO TEST BLOOD SUGAR 2 TIMES DAILY 50 strip 11   ??? UNABLE TO FIND COMPOUNDED METHYLCOBALAMIN 1000 MCG EVERY OTHER DAY BY INJECTION/ TRISTATE 1000 mL 11   ??? VITAMIN D3-VITAMIN K2, MK4, ORAL Take by mouth.       No facility-administered medications prior to visit.        Objective:   Physical Exam  There were no vitals  filed for this visit.    Gait and Stations: Not accessed via video    Mental Status Exam:   Appearance: appears stated age, casually dressed, grooming and hygiene fair  Behavior/Movement: polite, cooperative.   Speech: regular rate, rhythm, tone and prosody.  Naming and repeating intact.  No aphasia. Production within normal limit. Fluent and nonpressured.  Mood: doing better  Affect: euthymic, full range, mood congruent, content appropriate   Thought Process: organized and linear without any flight of ideas or loosening of associations.  Thought Content: Denies suicidal ideation, denies homicidal ideation. No ruminations or obsessions, no erotomanic, grandiose, persecutory, or bizarre delusions were noted.   Perceptions: Denies auditory hallucinations, denies visual hallucinations. Not seen responding to internal stimuli on camera.   Cognition:   -Sensorium: alert   -Orientation: oriented to person, place, situation and year   -Memory: grossly intact    -Fund Of Knowledge: appears consistent with average intellect based on vocabulary,    -Insight: good    -Judgement: good    PHQ-9 Scores:   PHQ Total  Score 03/30/2016 05/21/2016 06/24/2016 08/26/2016 09/29/2017   PHQ-9 Total Score 0 0 0 9 7       GAD7 Scores:   GAD7Total Score 08/26/2016 11/25/2016 02/03/2017 04/14/2017 09/29/2017   GAD-7 Total Score 10 6 2 6 4           Assessment/Plan:   59 y.o. Caucasian female, history of  unspecified anxiety disorder, atrial fibrillation, WPW, idiopathic mast cell activation, anemia, asthma, hypothyroidism, GERD, intermittent chronic HA, PNES, presents for follow up. Reports manageable anxiety with current regimen, interval improvements sustaining with current medications. Given significant anaphylaxis history, will continue current regimen with slight decrease in number of klonopin tablets dispensed as patient is using fewer benzodiazepines currently.    Diagnoses:  -Somatic symptom disorder  -Unspecified anxiety disorder, moderate  -Cluster B personality traits        # Anxiety, moderate  -Continue Buspirone 20 mg TID for anxiety  -Continue Hydroxyzine 25 mg TID, 50mg  QHS for anxiety  -Klonopin 0.5mg  TID, #50 tablets, 1RF.  -Continue treatment with integrative medicine for biofeedback, massage therapy and acupuncture  -Self-management: meditation, yoga  -RTC in 8 weeks via video  ??  Discussed risks/benefits/side effects/alternatives to medications. Discussed the risk of cognitive decline with long-term use of anti-histamine. Patient expressed understanding of and agreement to the current treatment plan.   ??  # Safety: The patient's imminent risk appears low, as she denies SI, does not express HI, no previous SA, future-oriented and goal-oriented to continue treatment. Patient expressed understanding of resources, including 911 and PES.  ??  # Substance:  -No active issues  ??  # Medical:  -Follow-up with PCP      Elvera Bicker, MD  Psychiatry PGY-3    Patient seen and plan discussed and agreed upon by attending Dr. Thomasena Edis    Portions of this note were copied forward from a previous note, changes have been made throughout to accurately  reflect elements obtained from today's evaluation.

## 2019-03-30 NOTE — Telephone Encounter (Signed)
Pharmacist at [facility] calling to clarify Klonopin script.

## 2019-03-30 NOTE — Telephone Encounter (Signed)
Spoke to pharmacy regarding prescription. Patient is allowed up to 3 tablets daily, but is not using that full amount daily.    Dispense quantity confirmed with pharmacy to be #60 tablets, and Rx instructions confirmed to be 1 tablet each morning and up to 2 tablets at bedtime.

## 2019-04-19 ENCOUNTER — Ambulatory Visit: Payer: PRIVATE HEALTH INSURANCE

## 2019-04-19 MED ORDER — clonazePAM (KLONOPIN) 0.5 MG tablet
0.5 | ORAL_TABLET | Freq: Three times a day (TID) | ORAL | 2 refills | Status: AC | PRN
Start: 2019-04-19 — End: 2019-07-12

## 2019-04-19 MED ORDER — clonazePAM (KLONOPIN) 0.5 MG tablet
0.5 | ORAL_TABLET | Freq: Three times a day (TID) | ORAL | 2 refills | Status: AC | PRN
Start: 2019-04-19 — End: 2019-04-19

## 2019-04-19 NOTE — Telephone Encounter (Signed)
Pharmacy is calling to get permission for an early refill. Patient has been taking 3 daily when the Rx is written for 60 tablets/PRN. Emily Bonilla [phone].

## 2019-04-19 NOTE — Telephone Encounter (Signed)
Note sent to pharmacy with prescription. Okay to fill early.

## 2019-04-25 ENCOUNTER — Ambulatory Visit: Admit: 2019-04-25 | Discharge: 2019-05-02 | Payer: PRIVATE HEALTH INSURANCE | Attending: Gastroenterology

## 2019-04-25 DIAGNOSIS — K219 Gastro-esophageal reflux disease without esophagitis: Secondary | ICD-10-CM

## 2019-04-25 NOTE — Unmapped (Signed)
GASTROENTEROLOGY AMBULATORY CONSULTATION NOTE    HISTORY OF PRESENT ILLNESS:    The patient is a 59 y.o. female with a history of systemic mastocytosis/mast cell activation syndrome, common variable immunodeficiency syndrome, history of cardiac arrest and anaphylaxis, GERD, status post cholecystectomy, focal hepatic adenoma, fatty liver, chronic diarrhea, has an appointment today for follow-up.  The patient overall is doing well.  She is on omeprazole 40 mg by mouth twice a day and Pepcid 40 mg by mouth twice a day.  She also reports that her diarrhea is better.  She is also using hyoscyamine 0.375 mg by mouth twice a day as needed for abdominal cramps and diarrhea.  She is still complaining from abdominal obesity.  CT scan of the abdomen without contrast did not show any GI abnormality but mostly she had abdominal fat.  She was treated empirically with budesonide for her chronic diarrhea but that helped her diarrhea but increased her bloating and abdominal distention.    She is being evaluated with genetics for inherited genetic diseases that can explain her mast cell disorder.  Denies any rectal bleeding or melena.  She is on Cromolyn 100 mg by mouth 4 times a day.    Prior GI workup:  06/2017 had negative stool culture, H.pylori and stool calprotectin.  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  ??  Procedure History:  EGD- over 20+ years ago per patient showing inflammation with negative eosinophils. No records available.  Colonoscopy- 8+ years ago per patient shows inflammation, was having colonoscopies every year for a while then every 5 years due to CVID and mast cell disorders. No records available.   ??     Past Medical History:   Diagnosis Date   ??? Anaphylaxis    ??? Anemia    ??? Anxiety    ??? Asthma    ??? Atrial fibrillation (CMS Dx)    ??? Bronchitis    ??? Heart disease    ??? Neurological  disease    ??? OSA (obstructive sleep apnea)     states resolved   ??? Osteoporosis    ??? Psychogenic nonepileptic seizure    ??? Seizures (CMS Dx)    ??? Spontaneous pneumothorax     states pneumomediatsium/pneumopericardium, self resolved   ??? Thyroid disease    ??? Ulcer    ??? WPW (Wolff-Parkinson-White syndrome)        Past Surgical History:   Procedure Laterality Date   ??? BONE MARROW BIOPSY      benign   ??? CARDIAC ELECTROPHYSIOLOGY MAPPING AND ABLATION      attempted x 2, states not successful either time   ??? CHOLECYSTECTOMY  2015   ??? DENTAL EXTRACTION Right 04/25/2017    Procedure: EXTRACTION TOOTH # 31, BRIDGE RESECTIONING;  Surgeon: Gladys Damme, DMD;  Location: [facility] OR;  Service: Oral Surgery;  Laterality: Right;   ??? FACIAL COSMETIC SURGERY     ??? HYSTERECTOMY  2009   ??? INSERTION CATHETER VASCULAR ACCESS N/A 09/06/2017    Procedure: INSERTION POWER PORT A CATH;  Surgeon: Penni Bombard, MD;  Location: [facility] OR;  Service: General;  Laterality: N/A;   ??? LIVER BIOPSY      benign hepatic adednoma per patient   ??? REMOVAL HARDWARE LEG Left 08/17/2016    Procedure: REMOVAL OF HARDWARE LEFT TIBIA;  Surgeon: Andee Lineman, MD;  Location: [facility] OR;  Service: Orthopedics;  Laterality: Left;   ??? TIBIA FRACTURE SURGERY     ??? TONSILLECTOMY      + adenoids       Family History:   Family History   Problem Relation Age of Onset   ??? Kidney failure Mother    ??? Aneurysm Mother        Social History     Socioeconomic History   ??? Marital status: Divorced     Spouse name: Not on file   ??? Number of children: Not on file   ??? Years of education: Not on file   ??? Highest education level: Not on file   Occupational History   ??? Not on file   Social Needs   ??? Financial resource strain: Not on file   ??? Food insecurity     Worry: Not on file     Inability: Not on file   ??? Transportation needs     Medical: Not on file     Non-medical: Not on file   Tobacco Use   ??? Smoking status: Never Smoker   ??? Smokeless tobacco: Never Used   Substance and Sexual Activity   ???  Alcohol use: No     Comment: Denies (11/15/2016)   ??? Drug use: No     Comment: Denies (11/15/2016)   ??? Sexual activity: Not Currently   Lifestyle   ??? Physical activity     Days per week: Not on file     Minutes per session: Not on file   ??? Stress: Not on file   Relationships   ??? Social Wellsite geologist on phone: Not on file     Gets together: Not on file     Attends religious service: Not on file     Active member of club or organization: Not on file     Attends meetings of clubs or organizations: Not on file     Relationship status: Not on file   ??? Intimate partner violence     Fear of current or ex partner: Not on file     Emotionally abused: Not on file     Physically abused: Not on file     Forced sexual activity: Not on file   Other Topics Concern   ??? Caffeine Use Yes   ??? Occupational Exposure Not Asked   ??? Exercise No   ??? Seat Belt Yes   Social History Narrative   ??? Not on file       Current Medications:      Current Outpatient Medications:   ???  blood-glucose meter Misc, Use meter to test blood glucose twice daily, Disp: 1 each, Rfl: 0  ???  busPIRone (BUSPAR) 10 MG tablet, Take 2 tablets (20 mg total) by mouth 3 times a day., Disp: 180 tablet, Rfl: 2  ???  cetirizine (ZYRTEC) 10 mg Cap, Take 10 mg by mouth daily., Disp: 30 capsule, Rfl: 2  ???  clonazePAM (KLONOPIN) 0.5 MG tablet, Take 1 tablet (0.5 mg total) by mouth 3 times a day as needed for Anxiety. 1 tablet each morning, and 2 at bedtime. Indications: anxiety, Disp: 75 tablet, Rfl: 2  ???  cromolyn (GASTROCROM) 100 mg/5 mL solution, Take 5 mLs (100 mg total) by mouth 4 times daily before meals and at bedtime., Disp: 480 mL, Rfl: 11  ???  cyanocobalamin, vitamin B-12, (VITAMIN B-12 INJ), Inject 0.5 mLs as directed., Disp: , Rfl:   ???  diphenhydrAMINE (BENADRYL) 25 mg  capsule, Take 3-6 capsules (75-150 mg total) by mouth daily., Disp: 90 capsule, Rfl: 2  ???  doxepin (SINEQUAN) 10 MG capsule, TAKE ONE (1) CAPSULE BY MOUTH AT BEDTIME., Disp: 30 capsule, Rfl: 0  ???   dulaglutide 1.5 mg/0.5 mL PnIj, Inject 1.5 mg subcutaneously every 7 days., Disp: 6 mL, Rfl: 3  ???  EASY TOUCH TWIST LANCETS 30 gauge Misc, USE TWICE A DAY AS DIRECTED., Disp: 100 each, Rfl: 11  ???  famotidine (PEPCID) 40 MG tablet, Take 40 mg by mouth 2 times a day., Disp: , Rfl:   ???  fexofenadine (ALLEGRA) 180 MG tablet, Take 1 tablet (180 mg total) by mouth daily., Disp: 30 tablet, Rfl: 3  ???  fluticasone propionate (FLONASE) 50 mcg/actuation nasal spray, Use 1 spray into each nostril daily., Disp: 16 g, Rfl: 1  ???  furosemide (LASIX) 20 MG tablet, Take 1 tablet (20 mg total) by mouth daily., Disp: 30 tablet, Rfl: 0  ???  hydrOXYzine HCL (ATARAX) 25 MG tablet, Take 1 tablet (25 mg total) by mouth every 8 hours as needed for Anxiety. May take additional 25mg  at bedtime., Disp: 120 tablet, Rfl: 2  ???  hyoscyamine (LEVBID) 0.375 mg 12 hr tablet, Take 1 tablet (0.375 mg total) by mouth every 12 hours as needed for Cramping., Disp: 60 tablet, Rfl: 0  ???  lancets Misc, USE TO TEST BLOOD SUGAR 2 TIMES DAILY- Easy touch twist 30g lancets, Disp: 100 each, Rfl: 0  ???  levalbuterol (XOPENEX) 1.25 mg/3 mL nebulizer solution, Inhale 3 mL (1.25 mg total) by nebulization every 6 hours as needed for Wheezing., Disp: 72 mL, Rfl: 0  ???  levothyroxine (SYNTHROID) 137 MCG tablet, Take 1 tablet (137 mcg total) by mouth daily., Disp: 90 tablet, Rfl: 3  ???  montelukast (SINGULAIR) 10 mg tablet, TAKE ONE TABLET BY MOUTH AT BEDTIME., Disp: 30 tablet, Rfl: 0  ???  nebulizers (COMPACT COMPRESSOR NEBULIZER) Misc, Please dispense 1 nebulizer with tubing and mouthpiece., Disp: 1 each, Rfl: 0  ???  omeprazole (PRILOSEC) 40 MG capsule, TAKE 1 CAPSULE BY MOUTH 2 TIMES DAILY BEFORE MEALS, Disp: 60 capsule, Rfl: 5  ???  onabotulinumtoxinA (BOTOX COSMETIC) 50 unit SolR, Inject into the muscle., Disp: , Rfl:   ???  predniSONE (DELTASONE) 10 MG tablet, Take 1 tablet (10 mg total) by mouth daily., Disp: 60 tablet, Rfl: 1  ???  predniSONE (DELTASONE) 20 MG tablet, TAKE ONE  TABLET BY MOUTH DAILY., Disp: 30 tablet, Rfl: 0  ???  Ringer's solution,lactated (LACTATED RINGERS) infusion, Intravenous 1,000 mLs once., Disp: , Rfl:   ???  triamcinolone (KENALOG) 0.1 % ointment, APPLY 2 TIMES DAILY, Disp: 80 g, Rfl: 11  ???  TRUE METRIX GLUCOSE TEST STRIP Strp, USE TO TEST BLOOD SUGAR 2 TIMES DAILY, Disp: 50 strip, Rfl: 11  ???  UNABLE TO FIND, COMPOUNDED METHYLCOBALAMIN 1000 MCG EVERY OTHER DAY BY INJECTION/ TRISTATE, Disp: 1000 mL, Rfl: 11  ???  VITAMIN D3-VITAMIN K2, MK4, ORAL, Take by mouth., Disp: , Rfl:     Allergies:  Acetaminophen, Adhesive tape-silicones, Alcohol, Amitriptyline, Aspirin, Benzoic acid, Blue dye, Flecainide, Gabapentin, Iodinated contrast media, Isopropyl alcohol, Latex, Levofloxacin, Lidocaine, Nsaids (non-steroidal anti-inflammatory drug), Onion, Other, Paraben, Perfume, Pregabalin, Preservative, Quetiapine, Red dye, Sulfa (sulfonamide antibiotics), Sulfur, Trazodone, Verapamil, Yellow dye, Green dye, Onion extract, Doxepin, Duloxetine, Epi e-z pen, Hydroxyzine, Statins-hmg-coa reductase inhibitors, and Verapamil (bulk)        REVIEW OF SYSTEMS:    Review of systems reviewed in the  chart.      Vitals:    BP 132/80    Pulse 84    Resp 16    Ht 5' 7 (1.702 m)    Wt 217 lb (98.4 kg)    LMP  (LMP Unknown)    BMI 33.99 kg/m??     PHYSICAL EXAM:    No significant interval change in physical exam since last visit.  General: Normal appearance, normal affect.    Head: Normocephalic, no lesions.    Eyes: Sclera normal color. Normal hydration.    Ears: Normal.    Nose: Mucosa normal, no sinus tenderness.    Neck: No masses, no thyromegaly, no adenopathy, no bruits.    Chest: Lungs clear, no rales, no rhonchi, no wheezes.    Heart: Regular rhythm, no murmurs, no rubs, no gallops.    Abdomen: Obese abdomen.  Soft, no tenderness, no masses, no enlargement of the liver or spleen, normal peristaltic sounds.    Rectal: Deferred.    Extremities: No deformities, no edema, no erythema, normal  peripheral pulses.    Neuro: Normal mental status, normal tendon reflexes, normal gait       DATA:     Lab Results   Component Value Date    WBC 9.8 10/19/2018    RBC 4.82 10/19/2018    HGB 13.3 10/19/2018    HCT 40.0 10/19/2018    PLT 386 10/19/2018    MCV 83.0 10/19/2018    MCH 27.6 10/19/2018    MCHC 33.3 10/19/2018    RDW 14.5 10/19/2018    NRBC 0 10/19/2018    LYMPHOPCT 27.0 10/19/2018    MONOPCT 9.7 10/19/2018    EOSPCT 5.4 10/19/2018    BASOPCT 0.6 10/19/2018    MONOSABS 951 (H) 10/19/2018    LYMPHSABS 2,646 10/19/2018    EOSABS 529 (H) 10/19/2018    BASOSABS 59 10/19/2018     Lab Results   Component Value Date    NA 143 10/19/2018    K 4.0 10/19/2018    CL 105 10/19/2018    CO2 26 10/19/2018    BUN 9 10/19/2018    CREATININE 0.58 (L) 10/19/2018    CALCIUM 9.2 10/19/2018    GLUCOSE 113 (H) 10/19/2018     Lab Results   Component Value Date    ALKPHOS 123 10/19/2018    ALT 32 10/19/2018    AST 16 10/28/2016    PROT 7.0 10/19/2018    BILITOT 0.3 10/19/2018     No results found for: PROTIME, INR, WARFARIN  No results found for: LABA1C  Lab Results   Component Value Date    TSH 3.14 10/19/2018     Lab Results   Component Value Date    VITAMINB12 163 (L) 06/09/2017     No results found for: FOLATE  Lab Results   Component Value Date    IRON 73 06/09/2017     Lab Results   Component Value Date    LABIRON 19.5 06/09/2017     Lab Results   Component Value Date    TIBC 375 06/09/2017     Lab Results   Component Value Date    FERRITIN 46.0 06/09/2017     No results found for: ANATITER, ANAPATTRN1, ANA    IMPRESSION/RECOMMENDATIONS:    1.  Epigastric pain likely due to GERD/gastritis is better controlled now:  Increased acidity of the stomach and GERD is one of the GI manifestations of systemic mastocytosis.  Symptoms has improved with  omeprazole 40 mg by mouth twice a day and Pepcid 40 mg by mouth twice a day.  I would continue omeprazole and Pepcid at the current doses.  Antireflux lifestyle.  I would consider  increasing famotidine but the patient develops recurrence of epigastric pain and reflux.    2.  Chronic diarrhea:  Improved with hyoscyamine.  Also likely related to systemic mastocytosis/mast cell activation syndrome.  Continue hyoscyamine 0.375 mg by mouth twice a day when necessary.  Low FODMAP diet.    I would  also consider increasing the dose of Cromolyn from 100 mg by mouth 4 times a day to 200 mg by mouth 4 times a day if the patient developed recurrence of her chronic diarrhea .    3.   Abdominal obesity:  MRI of the abdomen did not show any intra-abdominal mass/lesion but noted to have fatty liver and 1 cm liver cyst versus hemangioma.  She was counseled about weight loss and dietary changes.  Likely has metabolic syndrome.  RTC in 3 months.    The diagnoses and the plan of care was discussed with the patient.     Electronically signed by Glenna Fellows, MD on 04/25/2019 at 10:42 AM  This note was completely edited, written and reviewed by me and consists of information cut and pasted from the my most recent visit, my smart phrases and other Epic tools. I have personally reviewed all aspects of this note to at least include reviewing this patient's chart and problem list, updating the history, physical exam, lab and procedure results, and assessment and plan as detailed above and below.  As such this visit note reflects my current evaluation and management for this patient.

## 2019-04-27 ENCOUNTER — Ambulatory Visit: Admit: 2019-04-27 | Payer: PRIVATE HEALTH INSURANCE | Attending: Family

## 2019-04-27 DIAGNOSIS — G43719 Chronic migraine without aura, intractable, without status migrainosus: Secondary | ICD-10-CM

## 2019-04-27 MED ORDER — botulinum toxin A (BOTOX) 100 unit injection
100 | INTRAMUSCULAR | Status: AC
Start: 2019-04-27 — End: 2019-04-27

## 2019-04-27 MED ORDER — diphenhydrAMINE (BENADRYL) injection 50 mg
50 | Freq: Once | INTRAMUSCULAR | Status: AC
Start: 2019-04-27 — End: 2019-04-27
  Administered 2019-04-27: 15:00:00 50 mg via INTRAMUSCULAR

## 2019-04-27 MED ORDER — diphenhydrAMINE (BENADRYL) 50 mg/mL injection
50 | INTRAMUSCULAR | Status: AC
Start: 2019-04-27 — End: 2019-04-27

## 2019-04-27 MED ORDER — botulinum toxin A (BOTOX) injection 200 Units
100 | Freq: Once | INTRAMUSCULAR | Status: AC
Start: 2019-04-27 — End: 2019-04-27
  Administered 2019-04-27: 15:00:00 200 [IU] via INTRAMUSCULAR

## 2019-04-27 MED ORDER — sodium chloride 0.9%
INTRAMUSCULAR | Status: AC
Start: 2019-04-27 — End: 2019-04-27

## 2019-04-27 MED FILL — DIPHENHYDRAMINE 50 MG/ML INJECTION SOLUTION: 50 50 mg/mL | INTRAMUSCULAR | Qty: 1

## 2019-04-27 MED FILL — BOTOX 100 UNIT INJECTION: 100 100 unit | INTRAMUSCULAR | Qty: 2

## 2019-04-27 MED FILL — SODIUM CHLORIDE 0.9 % INJECTION SOLUTION: INTRAMUSCULAR | Qty: 10

## 2019-04-27 NOTE — Unmapped (Signed)
MRN:  [id number]  Date: 04/27/2019   Emily Bonilla is a 59 y.o. female who comes in for scheduled Botox injection.      BOTOX PREEMPT PROTOCOL       The risks, benefits and anticipated outcomes of the procedure, the risks and benefits of the alternatives to the procedure, and the roles and tasks of the personnel to be involved, were discussed with the patient.     The side effects of onabotulinum toxin used for the treatment of chronic migraine have been described to the patient in detail.  These include: ptosis, neck pain, neck weakness, anaphylaxis, cellulitis, systemic spread (death, respiratory failure, dysarthria, dysphagia, diplopia), blood born illnesses (HIV, hepatitis, mad cow disease).   The patient is aware that any side effects related to muscle paralysis could last for 3 months.     The patient has given written informed consent to the procedure and agrees to proceed.Yes     Treatment # 8   Dilution: 5 units/0.1 ml (200 U of botox was mixed with 4 ccs of bacteriostatic saline.)  Indication: Chronic Migraine        Injection Sites:  0.1 ccs of this solution was injected into the following:  Muscle    Fixed Site/Fixed Dose  L      R   Corrugator??????????????????????????????????????10 Units divided in 2 sites??????????????????????????????????????????  ??  Procerus ??????????????????????????????????????????5 Units in 1 site ????????????????????????????????????????????????????????????????????????  ??  Frontalis????????????????????????????????????????????20 Units divided in 4 sites????????????????????????????????????????????  ??  Temporalis ??????????????????????????????????40 Units divided in 8 sites ????????????????????????????????????????  ??  Occipitalis ??????????????????????????????????????30 Units divided in 6 sites ????????????????????????????????????????  ??  Cervical PSPs??????????????????????????20 Units divided in 4 sites??????????????????????????????????????????  ??  Trapezius????????????????????????????????????????30 Units divided in 6 sites???????????????????????????????????? 12.5 units extra left trapezius in 5 divided doses  ???????????????????????????????????????????????????????????????????????????????????????????????????????????????????????????????????????????????????????????????????????????????????????????? ??????????12.5 units extra right trapezius in 5 divided  doses?????????????????????????????????????????????????????????????????????????????????????????????????????????????????????????????????????????????????????????????????????????? ??????  ??  ??  Total Units used:180  Total Units wasted:20??  Patient??did??tolerate procedure.??    Change in Treatment Plan?no    Pt with Mast Cell Disease and had an activation during visit d/t smelling chemicals and from the pain of the shots.  Benadryl 50 mg IM given in office.     RTC: 3 Months, For Botox and PRN    Stefan Church, CNP

## 2019-05-10 NOTE — Unmapped (Signed)
Last visit 10/16/2018 Court Joy, MD  Future Appointments   Date Time Provider Department Center   05/17/2019 10:00 AM Orbie Pyo, MD [facility]   07/27/2019  9:30 AM Stefan Church, CNP [facility] NEUR [facility]   10/24/2019 10:30 AM Glenna Fellows, MD [facility] GAST [facility]     Requested Prescriptions     Pending Prescriptions Disp Refills   ??? furosemide (LASIX) 20 MG tablet [Pharmacy Med Name: FUROSEMIDE 20 MG TAB] 30 tablet 0     Sig: TAKE ONE TABLET BY MOUTH ONCE A DAY.       Pain Flowsheet (Urine Drug/OARRS-eKASPER/Narcotic) 03/21/2018 09/14/2018 03/29/2019   OARRS/eKASPER Status Reviewed Reviewed Reviewed   OARRS/eKASPER Consistent with Prescriber Expectation Y Malvin Johns        Allergies and pharmacy verified.     Forwarding to provider for approval or denial

## 2019-05-14 NOTE — Telephone Encounter (Signed)
Spoke with pt, appt reschedule to 7/16 at 9:30am

## 2019-05-14 NOTE — Telephone Encounter (Signed)
Patient called in to try to reschedule her botox injection for the following week. Her home health care nurse is on vacation that week. Please advise.

## 2019-05-17 ENCOUNTER — Other Ambulatory Visit: Admit: 2019-05-17 | Payer: PRIVATE HEALTH INSURANCE

## 2019-05-17 ENCOUNTER — Ambulatory Visit: Admit: 2019-05-17 | Discharge: 2019-05-17 | Payer: PRIVATE HEALTH INSURANCE | Attending: "Endocrinology

## 2019-05-17 DIAGNOSIS — M8580 Other specified disorders of bone density and structure, unspecified site: Secondary | ICD-10-CM

## 2019-05-17 LAB — POC GLU MONITORING DEVICE: POC Glucose Monitoring Device: 145 mg/dL — ABNORMAL HIGH (ref 70–100)

## 2019-05-17 LAB — PTH, INTACT: PTH: 82 pg/mL (ref 12.0–88.0)

## 2019-05-17 LAB — RENAL FUNCTION PANEL W/EGFR
Albumin: 4.3 g/dL (ref 3.5–5.7)
Anion Gap: 10 mmol/L (ref 3–16)
BUN: 14 mg/dL (ref 7–25)
CO2: 28 mmol/L (ref 21–33)
Calcium: 9.5 mg/dL (ref 8.6–10.3)
Chloride: 106 mmol/L (ref 98–110)
Creatinine: 0.79 mg/dL (ref 0.60–1.30)
Glucose: 113 mg/dL (ref 70–100)
Osmolality, Calculated: 299 mOsm/kg (ref 278–305)
Phosphorus: 3.7 mg/dL (ref 2.1–4.7)
Potassium: 4.6 mmol/L (ref 3.5–5.3)
Sodium: 144 mmol/L (ref 133–146)
eGFR AA CKD-EPI: >90 See note.
eGFR NONAA CKD-EPI: 83 See note.

## 2019-05-17 LAB — VITAMIN D 25 HYDROXY: Vit D, 25-Hydroxy: 37.5 ng/mL (ref 30.0–100.0)

## 2019-05-17 LAB — T4, FREE: Free T4: 0.8 ng/dL (ref 0.61–1.76)

## 2019-05-17 LAB — TSH: TSH: 4.13 u[IU]/mL — ABNORMAL HIGH (ref 0.45–4.12)

## 2019-05-17 LAB — VITAMIN B12: Vitamin B-12: 589 pg/mL (ref 180–914)

## 2019-05-17 LAB — POC HEMOGLOBIN A1C: POC Hemoglobin A1C: 6.6 % (ref 4.0–5.6)

## 2019-05-17 NOTE — Progress Notes (Signed)
Spoke to [place] and confirmed tomorrows appointment and reviewed chart

## 2019-05-17 NOTE — Patient Instructions (Signed)
Continue trucilty 1.5mg  weekly, and levothyroxine daily.     Please go to the lab to check your thyroid calcium and vitamin D.   Please collect a saliva sample at midnight to check cortisol.     Schedule a DXA scan in 10/2019    Follow-up after DXA scan

## 2019-05-17 NOTE — Unmapped (Signed)
Patient ID:  59 y.o. 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       She reports being diagnosed with osteopenia in 2010 on outside DXA scan. Her fracture history includes a fractured tibia and fibula from falling down 2 steps after passing out.   DXA scan on 10/18/17 showed lowest T-score of -1.8 at the femoral neck    Labs on 06/02/17 showed vitamin D=10.5 and PTH=151pg/mL on 06/02/2017.  She was prescribed ergocalciferol but could not tolerate and was switched to vitamin D3 5000units daily.  Vitamin D=28.3 on 08/25/17.    She was diagnosed with prediabetes in 2017, but had rapid weight gain in early 2019 and progressed to Type 2 diabetes with A1c=7.3% on 06/09/17.  So she was started on metformin 500mg  BID. A1c=6.7% on 10/12/17   Outside A1c= 5.5% on 01/31/18    She also has a history of hypothyroidism d/t hashimoto's disease on levothyroxine. She saw Dr Angelia Mould on 06/29/2017, who performed in-office ultrasound that showed heterogenous thyroid with no discrete nodule, c/w thyroiditis.     She has a history of vitamin D deficiency and secondary hyperparathyroidism, which had improved with vitamin D supplementation.  Labs on 11/10/17 showed vitD=37.7ng/mL, and PTH=79pg/mL.    Labs on 10/19/18 showed Ca=9.2mg /dL, ZOX=0.96EAV/WU, JWJ=191YN/WG, A1c=7.4%.    Interval History:    Since her previous appt on 11/10/18, she had MRI 12/13/18 that showed increased visceral fat and hepatic steatosis.     She is able to walk on the elliptical machine for short periods before her mast cell reaction, but tries several times per day.   She usually eats only 2 meals, at 11am and 5pm.     She discontinued metformin and titrated trulicity up to 1.5mg  per week and is tolerating it without adverse effects.      She checks her blood sugars 1-2x daily, and reports AM BG 120-160mg /dL, but on random checks later in the day when she doesn't feel well she will have BGs above 200mg /dL.      A1c=6.6% today    She had not taken any steroids for almost a year.      She continues to take levothyroxine daily.   Her weight has not changed.     She takes calcium+D3 500mg /2000u daily, She consumes about 1-2 servings of calcium rich foods daily.     PMH:  - Mast cell activation syndrome  - CVID  - osteopenia  - hypothyroidism d/t hashimotos's  - Type 2 diabetes mellitus    Family Hx:  Paternal grandmother - CADw/MI s/p CABG  Maternal grandfather - CADw/MI  Maternal uncle - CADw/MI  Maternal aunt - mastoctytosis, osteoporosis  Mom- thyroid cancer, hypothyroid, mastocytosis, HLD, osteoporosis  Niece- thyroid cancer, hypothyroid  Nephew- thyroid cancer, hypothyroid  Brother -  Type 1 diabetes, HLD, CADw/MI s/p CABG PCI     Social Hx:  - lives in [place], [place]  - No smoking, EtOH or illicit drugs    Current Outpatient Medications   Medication Sig Dispense Refill   ??? blood-glucose meter Misc Use meter to test blood glucose twice daily 1 each 0   ??? busPIRone (BUSPAR) 10 MG tablet Take 2 tablets (20 mg total) by mouth 3 times a day. 180 tablet 2   ??? cetirizine (  ZYRTEC) 10 mg Cap Take 10 mg by mouth daily. 30 capsule 2   ??? clonazePAM (KLONOPIN) 0.5 MG tablet Take 1 tablet (0.5 mg total) by mouth 3 times a day as needed for Anxiety. 1 tablet each morning, and 2 at bedtime. Indications: anxiety 75 tablet 2   ??? cromolyn (GASTROCROM) 100 mg/5 mL solution Take 5 mLs (100 mg total) by mouth 4 times daily before meals and at bedtime. 480 mL 11   ??? diphenhydrAMINE (BENADRYL) 25 mg capsule Take 3-6 capsules (75-150 mg total) by mouth daily. 90 capsule 2   ??? doxepin (SINEQUAN) 10 MG capsule TAKE ONE (1) CAPSULE BY MOUTH AT BEDTIME. 30 capsule 0   ??? dulaglutide 1.5 mg/0.5 mL PnIj Inject 1.5 mg subcutaneously every 7 days. 6 mL 3   ??? EASY TOUCH  TWIST LANCETS 30 gauge Misc USE TWICE A DAY AS DIRECTED. 100 each 11   ??? famotidine (PEPCID) 40 MG tablet Take 40 mg by mouth 2 times a day.     ??? fexofenadine (ALLEGRA) 180 MG tablet Take 1 tablet (180 mg total) by mouth daily. 30 tablet 3   ??? fluticasone propionate (FLONASE) 50 mcg/actuation nasal spray Use 1 spray into each nostril daily. 16 g 1   ??? furosemide (LASIX) 20 MG tablet Take 1 tablet (20 mg total) by mouth daily. 30 tablet 0   ??? hydrOXYzine HCL (ATARAX) 25 MG tablet Take 1 tablet (25 mg total) by mouth every 8 hours as needed for Anxiety. May take additional 25mg  at bedtime. 120 tablet 2   ??? lancets Misc USE TO TEST BLOOD SUGAR 2 TIMES DAILY- Easy touch twist 30g lancets 100 each 0   ??? levalbuterol (XOPENEX) 1.25 mg/3 mL nebulizer solution Inhale 3 mL (1.25 mg total) by nebulization every 6 hours as needed for Wheezing. 72 mL 0   ??? levothyroxine (SYNTHROID) 137 MCG tablet Take 1 tablet (137 mcg total) by mouth daily. 90 tablet 3   ??? montelukast (SINGULAIR) 10 mg tablet TAKE ONE TABLET BY MOUTH AT BEDTIME. 30 tablet 0   ??? nebulizers (COMPACT COMPRESSOR NEBULIZER) Misc Please dispense 1 nebulizer with tubing and mouthpiece. 1 each 0   ??? omeprazole (PRILOSEC) 40 MG capsule TAKE 1 CAPSULE BY MOUTH 2 TIMES DAILY BEFORE MEALS 60 capsule 5   ??? onabotulinumtoxinA (BOTOX COSMETIC) 50 unit SolR Inject into the muscle.     ??? triamcinolone (KENALOG) 0.1 % ointment APPLY 2 TIMES DAILY 80 g 11   ??? TRUE METRIX GLUCOSE TEST STRIP Strp USE TO TEST BLOOD SUGAR 2 TIMES DAILY 50 strip 11   ??? UNABLE TO FIND COMPOUNDED METHYLCOBALAMIN 1000 MCG EVERY OTHER DAY BY INJECTION/ TRISTATE 1000 mL 11   ??? VITAMIN D3-VITAMIN K2, MK4, ORAL Take by mouth.     ??? cyanocobalamin, vitamin B-12, (VITAMIN B-12 INJ) Inject 0.5 mLs as directed.       No current facility-administered medications for this visit.      Objective:  Physical Exam:  Gen: caucasian female  in NAD, alert, cooperative   Eyes: EOMI, no lid lad, periorbital edema, or  proptosis  ENT: MMM, no oral lesions or exudates   Neck: nontender, no palpable masses  Resp: CTAB, no wheezes   CV: RRR, no murmur   Back: nontender to palpation of spine, no CVA tenderness, No kyphosis or scoliosis  Abd: soft, nondistended, nontender  Ext: no peripheral edema, 2+ pulses   Neuro: no tremor, normal gait, Strength 5/5 throughout  Psych: normal speech  and mood, appropriate thought content,      Labs:  Component      Latest Ref Rng & Units 05/26/2018 10/19/2018 05/17/2019   Creatinine      0.60 - 1.30 mg/dL 1.61 0.96 (L)    Glucose      70 - 100 mg/dL 82 045 (H)    Calcium      8.6 - 10.3 mg/dL 9.0 9.2    Bili, Total      0.0 - 1.5 mg/dL  0.3    SGOT (AST)      13 - 39 U/L  22    ALT (SGPT)      7 - 52 U/L  32    Alkaline Phosphatase      36 - 125 U/L  123    Protein, Total      6.4 - 8.9 g/dL  7.0    Albumin      3.5 - 5.7 g/dL 3.9 4.4    eGFR NONAA CKD-EPI      See note. >90 >90    Phosphorus      2.1 - 4.7 mg/dL 3.2     Cholesterol, Total      0 - 200 mg/dL 409 (H)     Triglycerides, Serum      10 - 149 mg/dL 811 (H)     HDL      60 - 92 mg/dL 32 (L)     LDL Cholesterol      mg/dL 914     TSH      7.82 - 4.12 uIU/mL 4.73 (H) 3.14    Free T4      0.61 - 1.76 ng/dL 9.56     Vit D, 21-HYQMVHQ      30.0 - 100.0 ng/mL 33.8     Hemoglobin A1C      4.0 - 5.6 % 7.1 (H) 7.4 (H)    PTH      12.0 - 88.0 pg/mL  108.0 (H)    POCT - Hemoglobin A1C      4.0 - 5.6 %   6.6 (H)     Imaging:      Date LS Left FN Left TH Right FN Right TH 1/3 FA     10/18/17 BMD:0.970g/cm2  T-score: -0.7 SD   Z-score: 0.5 SD BMD:0.633 g/cm2  T-score: -1.9  SD  Z-score: -0.8 SD BMD:0.953 g/cm2   T-score: 0.1 SD   Z-score: 0.8 SD  BMD: 0.651 g/cm2  T-score: -1.8  SD  Z-score: -0.7 SD BMD: 0.877g/cm2   T-score: -0.5 SD   Z-score: 0.2 SD BMD: 0.650 g/cm2  T-score: -0.7  SD  Z-score: 0.3 SD     Assessment/Plan:  59 y.o. female with osteopenia, hypothyroidism, hyperparathyroidism, and Type 2 diabetes.    Emily Bonilla has bone density in the  osteopenic range.  She has multiple risk factors for low bone density including, sex, postmenopausal status, extensive corticosteroid use, secondary hyperparathyroidism, and family history.  Her estimated risk for fracture by FRAX is below the threshold for treatment and she is no longer taking corticosteroids, so we will not start pharmacologic therapy.  She is due for a DXA scan in 10/2019.       She had elevated PTH with normal serum calcium levels c/w secondary hyperparathyroidism likely due to inadequate calcium intake/absorption due to her diarrhea.  Her diarhea has resolved and she increased her calcium intake, so we will check a renal panel and PTH level  She also has hypothyroidism d/t hashimoto's disease and has been treated with levothyroxine daily.  She is due for TFTs.    She has good glycemic control as indicated by her A1c=6.6%.  Therefore we will continue with trulicity 1.5mg  weekly.        Plan:  - continue dulaglutide 1.5mg  weekly  - continue levothyroxine daily, for now      - check TSH, FT4  - 1200mg  of calcium daily form diet and supplements combined  -  Check renal panel, PTH and vitamin D levels.  - DXA scan in 10/2019    Follow-up in 6 months.    The past history was reviewed and copied forward (with edits) from a my note written on 11/10/18. I have reviewed and updated the history, physical exam, data, assessment, and plan of the note so that it reflects my current evaluation and management of the patient.      I spent 40 minutes speaking with the patient, conducting an interview, performing a limited exam, and educating the patient on my assessment and plan. I also spent 10 minutes, on the same day as the encounter, preparing to see the patient (eg, review of tests), ordering medications, tests, or procedures, documenting clinical information in the electronic or other health record and performing non-face-to-face activities.

## 2019-05-22 ENCOUNTER — Other Ambulatory Visit: Admit: 2019-05-22 | Payer: PRIVATE HEALTH INSURANCE

## 2019-05-22 DIAGNOSIS — M8580 Other specified disorders of bone density and structure, unspecified site: Secondary | ICD-10-CM

## 2019-05-22 LAB — SALIVARY CORTISOL X2, TIMED
Salivary Cortisol #1: 0.015 ug/dL
Salivary Cortisol #2: 0.02 ug/dL

## 2019-05-23 NOTE — Unmapped (Signed)
Last visit 10/16/2018 Court Joy, MD    Requested Prescriptions     Pending Prescriptions Disp Refills   ??? furosemide (LASIX) 20 MG tablet [Pharmacy Med Name: FUROSEMIDE 20 MG TAB] 30 tablet 0     Sig: TAKE ONE TABLET BY MOUTH ONCE A DAY.       Allergies and pharmacy verified.     Forwarding to provider for approval or denial    Future Appointments   Date Time Provider Department Center   07/12/2019  3:15 PM Daryll Brod L. Boothe, MD [facility] PSY BUR None   08/03/2019  9:30 AM Stefan Church, CNP [facility] NEUR GNI UCGNI   10/09/2019 10:00 AM [facility] DEXA [facility] M[facility]M [facility]   10/18/2019 10:00 AM Orbie Pyo, MD [facility] MAB MAB   10/24/2019 10:30 AM Delsa Bern Al-Khedr, MD [facility] GAST [facility] WCS     Forward to PCP for review and authorization if appropriate. Please verify qty and refills.

## 2019-05-23 NOTE — Unmapped (Signed)
Last visit 10/16/2018 Court Joy, MD    Requested Prescriptions     Pending Prescriptions Disp Refills   ??? furosemide (LASIX) 20 MG tablet 30 tablet 0     Sig: Take 1 tablet (20 mg total) by mouth daily.       Allergies and pharmacy verified.     Forwarding to provider for approval or denial    Future Appointments   Date Time Provider Department Center   07/12/2019  3:15 PM Daryll Brod L. Boothe, MD [facility] PS[facility] BUR None   08/03/2019  9:30 AM Stefan Church, CNP [facility] NEUR GNI UCGNI   10/09/2019 10:00 AM [facility] DEXA [facility] M[facility] Imaging   10/18/2019 10:00 AM Orbie Pyo, MD [facility] MAB MAB   10/24/2019 10:30 AM Delsa Bern Al-Khedr, MD [facility]     Forward to PCP for review and authorization if appropriate. Please verify qty and refills.

## 2019-05-24 MED ORDER — furosemide (LASIX) 20 MG tablet
20 | ORAL_TABLET | ORAL | 0 refills | Status: AC
Start: 2019-05-24 — End: 2019-07-19

## 2019-06-07 NOTE — Unmapped (Signed)
Future Appointments   Date Time Provider Department Center   06/11/2019  9:40 AM Andee Lineman, MD [facility] ORTH [facility]   07/12/2019  3:15 PM Daryll Brod L. Boothe, MD [facility] PSY BUR None   08/03/2019  9:30 AM Stefan Church, CNP [facility] NEUR GNI UCGNI   10/09/2019 10:00 AM [facility] MAMMO DEXA [facility] MAMM [facility]   10/18/2019 10:00 AM Orbie Pyo, MD [facility] MAB MAB   10/24/2019 10:30 AM Delsa Bern Al-Khedr, MD [facility] GAST [facility] [place]

## 2019-06-11 ENCOUNTER — Ambulatory Visit: Payer: PRIVATE HEALTH INSURANCE

## 2019-06-28 ENCOUNTER — Ambulatory Visit: Admit: 2019-06-28 | Discharge: 2019-06-28 | Payer: PRIVATE HEALTH INSURANCE

## 2019-06-28 ENCOUNTER — Inpatient Hospital Stay: Admit: 2019-06-28 | Discharge: 2019-07-02 | Payer: PRIVATE HEALTH INSURANCE | Attending: Orthopaedic Trauma

## 2019-06-28 DIAGNOSIS — M160 Bilateral primary osteoarthritis of hip: Secondary | ICD-10-CM

## 2019-06-28 DIAGNOSIS — M25551 Pain in right hip: Secondary | ICD-10-CM

## 2019-06-28 NOTE — Telephone Encounter (Signed)
Patient called to schedule a NPV. Requesting a call back. Please advise.

## 2019-06-28 NOTE — Unmapped (Signed)
Emily Bonilla is a 59 y.o. female who comes in for eval of chief complaint consisting of what she describes as muscle collapse.  She has episodes that occur without known inciting event where she feels like her hip muscles just collapse now on occasion her knee muscles.  She has had no obvious injuries that she is aware of.  Occasionally this is accompanied by pain and sometimes severe muscular pain.  Does not sound radicular in nature.  She does not describe any neurologic symptoms other than this muscle fatigue/collapse/failure.  She does have occasional intermittent cramps in the lower extremities worse on the left than the right.  Several years ago I took a plate out of her left leg and that helped her pain and her mast cell response.  She does have mast cell disease.Marland Kitchen    PMH and ROS are signed and dated in the chart and will not be re-dictated.    PE:  The patient is awake, alert, and oriented and in no acute distress with a moderately obese body habitus, Body mass index is 33.32 kg/m??.Marland Kitchen  Examination of gait demonstrates essentially normal station and gait.  The affected affected lower extremities hips and pelvis demonstrates mild tenderness over the trochanteric bursa and mild tenderness over the sacroiliac joints in the lumbar spine with palpation.  ROM exam demonstrates full range of motion of both hips and knees with hip flexion bilaterally 120 degrees full extension abduction to 30 with internal rotation of 20 and external rotation of 40 all without pain.  Bilateral knee flexion extension arc 0 to 120 degrees.  Examination of instability is as follows: No instability of the pelvis to lateral compression and no instability of either hip or knee. The neuromuscular and vascular exam is as follows:  Neurovascular exam BLE -bilateral hip abduction and adduction are 5/5 motor strength.  Knee flexion and extension are 5/5 motor strength. EHL, tibialis anterior and gastroc soleus complex are intact with 5/5  motor strength.  The light touch is intact dorsal, plantar, in the first web space, and palpable pulses in the dorsalis pedis and posterior tibia with cap refill brisk in all digits.  .  The skin examination shows no ecchymosis or bruising around the pelvis.  The patient's cardiovascular and respiratory exam demonstrates regular rate and rhythm with a normal respiration and a soft abdomen to palpation.      RADIOGRAPHS including AP pelvis and AP and lateral of the bilateral proximal femurs demonstrate some osteopenia in the proximal femurs consistent with early osteoporosis.  She has minimal to no hip arthritis and no lumbar spine arthritis.    ASSESSMENT AND PLAN:  Emily Bonilla presents with the following condition: Bilateral hip and lower extremity muscle fatigue/failure of unclear etiology.  We spent approx 20-25 min discussing the treatment.  Unfortunately it is not clear to me what the diagnosis is.  I am not certain if she has some underlying neuromuscular disease that is intermittent.  I do not believe she has a mechanical issue that requires surgical intervention.  I do not believe that this is related to an overuse injury.  Unfortunately, I really do not understand its intermittent nature.  I have discussed referral and consultation to Dr. Darvin Neighbours as her current neurologist is really doing Botox injections to trigger points up in her neck and back and she would prefer to see someone new for this at this point.  The patient understands and agrees with our plan and will call if  there are any questions or problems.

## 2019-07-02 ENCOUNTER — Ambulatory Visit: Admit: 2019-07-02 | Payer: PRIVATE HEALTH INSURANCE

## 2019-07-02 DIAGNOSIS — M9905 Segmental and somatic dysfunction of pelvic region: Secondary | ICD-10-CM

## 2019-07-02 NOTE — Unmapped (Signed)
New Patient Consultation Note    History: Reliable historian    ID: Patient is a 59 y.o. year old female referred by Dr. Cloyde Reams for consultation for muscle fatigue and frequent falls.     HPI:    Emily Bonilla is a 59 y.o. year old female presenting to me for the first time with her aide for muscle fatigue and frequent falls. The pt states over the past 2 years she has been having issues with falling and muscle fatigue, which is different than her normal syncopal episodes. She states over the past 3-4 months she has fallen over 20 times, though most of the time she has near falls and is able to catch herself. She has aides who help her at home due to her frequent falls and she also has an alert button. She states that she fell and had a L tib fib fx , Dr. Cloyde Reams had to remove the hardware later due to her reaction to the titanium. She states her left knee buckles, and most of the time there is no pain when this happens. She states these episodes of falling feel like muscle fatigue. She did three months of PT and worked on various exercises and falling safely, but her endurance still remains remarkably low. She endorses past dry needling and reports she tolerated it well. She also used to have Botox injections with EMG guidance along her spine which was helpful for her, stopped 7 years ago. She states that if she pushes herself too far she cannot use her legs at all and will have to crawl to mobilize. She states this muscle fatigue varies in intensity. She sees Neurologist for her migraines but has not had a workup at this point. Standing aggravates her R knee weakness while laying still and resting can help alleviate it. She does not take any medications for her pain as she reacts to them due to her MAS. She does not quantify her pain as her primary concern is weakness She states that she had done water exercise in the past (saltwater) and she enjoyed that. She states that she cannot tolerate chlorine as  she will have syncopal episodes and anaphylactic reactions. Independent with ADLs, has aides 6 hours a day 4 days a week. She cannot walk into grocery stores due to the reactions she has to produce and dyes. She states that she cannot take showers and can only take baths due to the seizures she has when her BP changes. She has a port but has been off of IV fluids and benadryl for around 6 months.             History:  Past Medical History:   Diagnosis Date   ??? Anaphylaxis    ??? Anemia    ??? Anxiety    ??? Asthma    ??? Atrial fibrillation (CMS Dx)    ??? Bronchitis    ??? Heart disease    ??? Neurological disease    ??? OSA (obstructive sleep apnea)     states resolved   ??? Osteoporosis    ??? Psychogenic nonepileptic seizure    ??? Seizures (CMS Dx)    ??? Spontaneous pneumothorax     states pneumomediatsium/pneumopericardium, self resolved   ??? Thyroid disease    ??? Ulcer    ??? WPW (Wolff-Parkinson-White syndrome)      Past Surgical History:   Procedure Laterality Date   ??? BONE MARROW BIOPSY      benign   ??? CARDIAC  ELECTROPHYSIOLOGY MAPPING AND ABLATION      attempted x 2, states not successful either time   ??? CHOLECYSTECTOMY  2015   ??? DENTAL EXTRACTION Right 04/25/2017    Procedure: EXTRACTION TOOTH # 31, BRIDGE RESECTIONING;  Surgeon: Gladys Damme, DMD;  Location: [facility] OR;  Service: Oral Surgery;  Laterality: Right;   ??? FACIAL COSMETIC SURGERY     ??? HYSTERECTOMY  2009   ??? INSERTION CATHETER VASCULAR ACCESS N/A 09/06/2017    Procedure: INSERTION POWER PORT A CATH;  Surgeon: Penni Bombard, MD;  Location: [facility] OR;  Service: General;  Laterality: N/A;   ??? LIVER BIOPSY      benign hepatic adednoma per patient   ??? REMOVAL HARDWARE LEG Left 08/17/2016    Procedure: REMOVAL OF HARDWARE LEFT TIBIA;  Surgeon: Andee Lineman, MD;  Location: [facility] OR;  Service: Orthopedics;  Laterality: Left;   ??? TIBIA FRACTURE SURGERY     ??? TONSILLECTOMY      + adenoids     Family History   Problem Relation Age of Onset   ??? Kidney failure Mother    ??? Aneurysm Mother       Social History     Socioeconomic History   ??? Marital status: Divorced     Spouse name: None   ??? Number of children: None   ??? Years of education: None   ??? Highest education level: None   Occupational History   ??? None   Tobacco Use   ??? Smoking status: Never Smoker   ??? Smokeless tobacco: Never Used   Vaping Use   ??? Vaping Use: Never assessed   Substance and Sexual Activity   ??? Alcohol use: No     Comment: Denies (11/15/2016)   ??? Drug use: No     Comment: Denies (11/15/2016)   ??? Sexual activity: Not Currently   Other Topics Concern   ??? Caffeine Use Yes   ??? Occupational Exposure Not Asked   ??? Exercise No   ??? Seat Belt Yes   Social History Narrative   ??? None     Social Determinants of Health     Financial Resource Strain:    ??? Difficulty of Paying Living Expenses:    Food Insecurity:    ??? Worried About Programme researcher, broadcasting/film/video in the Last Year:    ??? Barista in the Last Year:    Transportation Needs:    ??? Freight forwarder (Medical):    ??? Lack of Transportation (Non-Medical):    Physical Activity:    ??? Days of Exercise per Week:    ??? Minutes of Exercise per Session:    Stress:    ??? Feeling of Stress :    Social Connections:    ??? Frequency of Communication with Friends and Family:    ??? Frequency of Social Gatherings with Friends and Family:    ??? Attends Religious Services:    ??? Database administrator or Organizations:    ??? Attends Engineer, structural:    ??? Marital Status:    Intimate Programme researcher, broadcasting/film/video Violence:    ??? Fear of Current or Ex-Partner:    ??? Emotionally Abused:    ??? Physically Abused:    ??? Sexually Abused:      Allergies   Allergen Reactions   ??? Acetaminophen Anaphylaxis     Unknown   ??? Adhesive Tape-Silicones Itching, Rash, Swelling and Anaphylaxis     Unknown   ??? Alcohol Anaphylaxis   ???  Amitriptyline Anaphylaxis   ??? Aspirin Anaphylaxis     Unknown   ??? Benzoic Acid Anaphylaxis   ??? Blue Dye Anaphylaxis     Unknown   ??? Flecainide Shortness Of Breath and Anaphylaxis     Itching, decreased libido,  wheezing, flushing, numbness.    ??? Gabapentin Anaphylaxis   ??? Iodinated Contrast Media Anaphylaxis   ??? Isopropyl Alcohol Anaphylaxis   ??? Latex Anaphylaxis and Rash   ??? Levofloxacin Itching and Anaphylaxis   ??? Lidocaine Anaphylaxis   ??? Nsaids (Non-Steroidal Anti-Inflammatory Drug) Anaphylaxis   ??? Onion Anaphylaxis   ??? 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   ??? Paraben Anaphylaxis   ??? Perfume Anaphylaxis   ??? Pregabalin Anaphylaxis   ??? Preservative Anaphylaxis   ??? Quetiapine Anaphylaxis     Mental Status Change  Other reaction(s): Angioedema   ??? Red Dye Anaphylaxis     Unknown   ??? Sulfa (Sulfonamide Antibiotics) Anaphylaxis   ??? Sulfur Anaphylaxis     Pt states: Any alcohol product   ??? Trazodone Rash and Anaphylaxis   ??? Verapamil Anaphylaxis   ??? Yellow Dye Anaphylaxis     Unknown   ??? Green Dye Rash     Other reaction(s): Contact Dermatitis   ??? Onion Extract Other (See Comments)   ??? Doxepin Other (See Comments)     Sedation and sleep paralysis (side effect)   ??? Duloxetine      Other reaction(s): Other (See Comments)  Mental Status Change   ??? Epi E-Z Pen    ??? Hydroxyzine Itching     Unknown   ??? Statins-Hmg-Coa Reductase Inhibitors    ??? Verapamil (Bulk) Hives     Insomnia, flushing, scattered brain, migraine, itching, anxiety.        Home Medications:   Current Outpatient Medications   Medication Sig   ??? blood-glucose meter Use meter to test blood glucose twice daily   ??? ZyrTEC Take 10 mg by mouth daily.   ??? clonazePAM Take 1 tablet (0.5 mg total) by mouth 3 times a day as needed for Anxiety. 1 tablet each morning, and 2 at bedtime. Indications: anxiety   ??? cromolyn Take 5 mLs (100 mg total) by mouth 4 times daily before meals  and at bedtime.   ??? cyanocobalamin, vitamin B-12, (VITAMIN B-12 INJ) Inject 0.5 mLs as directed.   ??? diphenhydrAMINE Take 3-6 capsules (75-150 mg total) by mouth daily.   ??? doxepin TAKE ONE (1) CAPSULE BY MOUTH AT BEDTIME.   ??? dulaglutide Inject 1.5 mg subcutaneously every 7 days.   ??? Easy Touch Twist Lancets USE TWICE A DAY AS DIRECTED.   ??? famotidine Take 40 mg by mouth 2 times a day.   ??? fexofenadine Take 1 tablet (180 mg total) by mouth daily.   ??? fluticasone propionate Use 1 spray into each nostril daily.   ??? furosemide TAKE ONE TABLET BY MOUTH ONCE A DAY.   ??? hydrOXYzine HCL Take 1 tablet (25 mg total) by mouth every 8 hours as needed for Anxiety. May take additional 25mg  at bedtime.   ??? lancets USE TO TEST BLOOD SUGAR 2 TIMES DAILY- Easy touch twist 30g lancets   ??? levalbuterol Inhale 3 mL (1.25 mg  total) by nebulization every 6 hours as needed for Wheezing.   ??? levothyroxine Take 1 tablet (137 mcg total) by mouth daily.   ??? montelukast TAKE ONE TABLET BY MOUTH AT BEDTIME.   ??? Compact Compressor Nebulizer Please dispense 1 nebulizer with tubing and mouthpiece.   ??? omeprazole TAKE 1 CAPSULE BY MOUTH 2 TIMES DAILY BEFORE MEALS   ??? onabotulinumtoxinA (cosmetic) Inject into the muscle.   ??? triamcinolone APPLY 2 TIMES DAILY   ??? True Metrix Glucose Test Strip USE TO TEST BLOOD SUGAR 2 TIMES DAILY   ??? UNABLE TO FIND COMPOUNDED METHYLCOBALAMIN 1000 MCG EVERY OTHER DAY BY INJECTION/ TRISTATE   ??? VITAMIN D3-VITAMIN K2, MK4, ORAL Take by mouth.     No current facility-administered medications for this visit.       ROS:   Please refer to scanned document.     Physical Exam:    Vitals:    07/02/19 0856   BP: 129/79   BP Location: Right arm   Patient Position: Sitting   BP Cuff Size: Large   Pulse: 82   Temp: 96.9 ??F (36.1 ??C)   SpO2: 97%   Weight: 215 lb 14.4 oz (97.9 kg)   Height: 5' 7 (1.702 m)       General:  NAD.   Ear/nose/throat/mouth:  Moist mucus membranes. Nares patent.   Eye:  EOMI   Lungs: No labored  breathing  Heart: Well perfused  Extremities: warm and well perfused, no edema in extremities  Skin: no rashes or lesions in exposed areas.   MSK: Muscle bulk symmetric in the bilateral upper and lower extremities.     Lower Ext Motor  Right  Left    Hip Flexors  5/5  5/5    Knee Extensors  5/5  5/5    Knee Flexors 5/5  5/5    Ankle DF  5/5  5/5    EHL 5/5  5/5      Right iliac crest is higher than the left   Tightness and tenderness in thoracolumbar paraspinals   Right SI dysfunction   Requires assistance with forward flexion to 60 degrees  Lumbar ext 20 degrees  Neg Lumbar facet maneuvers, bil   Dec sensation in bilateral medial lower legs   ASIS asymmetry   Left leg appears shorter than the right   Psych:  Cooperative, nml affect      Labs:   Lab Results   Component Value Date    GLUCOSE 113 (H) 05/17/2019    BUN 14 05/17/2019    CO2 28 05/17/2019    CREATININE 0.79 05/17/2019    K 4.6 05/17/2019    NA 144 05/17/2019    CL 106 05/17/2019    CALCIUM 9.5 05/17/2019    PROT 7.0 10/19/2018    ALBUMIN 4.3 05/17/2019    BILITOT 0.3 10/19/2018    ALKPHOS 123 10/19/2018    AST 16 10/28/2016    ALT 32 10/19/2018     Lab Results   Component Value Date    WBC 9.8 10/19/2018    HGB 13.3 10/19/2018    HCT 40.0 10/19/2018    MCV 83.0 10/19/2018    PLT 386 10/19/2018     No results found for: PTT, INR  Lab Results   Component Value Date    TSH 4.13 (H) 05/17/2019       Prior Diagnostic Testing    06/28/19 XR Hip Bilat incl Pelvis 3-4 vws    FINDINGS:  No acute fracture or malalignment. There is mild arthrosis of both hips. The pubic symphysis and sacroiliac joints appear maintained. Tubal ligation clips are noted in the pelvis.     Impression   IMPRESSION:     Mild arthrosis of both hips.        01/24/2018 XR Cervical and Lumbar Spine 2 or 3-views    FINDINGS:     There is mild smooth reversal of the normal cervical lordosis. The vertebral alignment is otherwise intact. Vertebral body heights are maintained.   Multilevel  degenerative disc disease is present with moderate disc space narrowing from C3 through C7 associated with anterior hypertrophic changes. Facet arthrosis is noted at C7-T1.   A central catheter is partially visualized.     5 lumbar type vertebral bodies are identified. Minimal retrolisthesis is noted at L3-4. The vertebral alignment is otherwise intact. Vertebral body heights are maintained. Moderate disc space narrowing is noted at L5-S1. Mild disc space narrowing is present at L3-4. Facet arthrosis is noted in the lower lumbar spine     Surgical clips are present in the right upper abdomen.     Impression   IMPRESSION:     Cervical   1. ??Multilevel degenerative disc disease at C3-C7. Facet arthrosis at C7-T1.     Lumbar   1. ??Degenerative disc disease and facet arthrosis as described.          Assessment  Shawntae Lowy is a 59 y.o. White or Caucasian female with a PMH of mast cell activation syndrome presenting to me for the first time with her aide for muscle fatigue and frequent falls    1. Pelvic somatic dysfunction  2. Right sacroiliac joint dysfunction    Plan:    1. Uncertain what is causing her fatigue and frequent falls. Recommend PT to include land and salt water aquatic therapy (allegeric to chlorine) to help with pelvic stabilization, sacroiliac mobilization and to improve endurance.         I spent 30 minutes with the patient, with over 25 minutes of this time devoted to discussions related to diagnosis, treatment and management, as detailed above.    By signing my name below, I, Eudelia Bunch, attest that this documentation has been prepared under the direction and in the presence of Darvin Neighbours, DO.   Scribe name: Eudelia Bunch. Date: 07/02/2019    Darvin Neighbours, DO  Physical Medicine & Rehabilitation

## 2019-07-02 NOTE — Unmapped (Addendum)
1. Ordered PT (land + saltwater aquatic therapy)   By the [facility]  Address: [address], [place], [place] [address]  Phone: [phone]

## 2019-07-03 NOTE — Telephone Encounter (Signed)
lov-03/29/2019  Next -07/12/2019

## 2019-07-05 MED ORDER — busPIRone (BUSPAR) 10 MG tablet
10 | ORAL_TABLET | Freq: Three times a day (TID) | ORAL | 2 refills | Status: AC
Start: 2019-07-05 — End: 2019-08-30

## 2019-07-12 ENCOUNTER — Ambulatory Visit
Admit: 2019-07-12 | Discharge: 2019-07-12 | Payer: PRIVATE HEALTH INSURANCE | Attending: Student in an Organized Health Care Education/Training Program

## 2019-07-12 DIAGNOSIS — F419 Anxiety disorder, unspecified: Secondary | ICD-10-CM

## 2019-07-12 MED ORDER — clonazePAM (KLONOPIN) 0.5 MG tablet
0.5 | ORAL_TABLET | Freq: Three times a day (TID) | ORAL | 2 refills | Status: AC | PRN
Start: 2019-07-12 — End: 2019-08-30

## 2019-07-12 MED ORDER — hydrOXYzine HCL (ATARAX) 25 MG tablet
25 | ORAL_TABLET | Freq: Three times a day (TID) | ORAL | 2 refills | Status: AC | PRN
Start: 2019-07-12 — End: 2020-05-03

## 2019-07-12 NOTE — Unmapped (Signed)
This was a Video visit, including two-way audio and video communication, in lieu of an in-person visit.I have discussed, with Emily Bonilla the risks associated with appointments using video platforms, which includes the risk of breach of PHI due to technology limitations.  I have also discussed the benefit of getting immediate access to care during this crisis.  The patient provided informed consent for this appointment via Epic/MyChart which includes the use of synchronous audio/video.      I spent 25 minutes speaking with the patient, conducting an interview, performing a limited exam, and educating the patient on my assessment and plan. I also spent 15 minutes, on the same day as the encounter, preparing to see the patient (eg, review of tests), ordering medications, tests, or procedures and documenting clinical information in the electronic or other health record.    Context:??somatic symptoms  Location: Altered mental status of anxiety  Duration: chronic  Severity:??mild  Associated Symptoms: anxiety  Modifying Factors:improving        History of Present Illness:   Emily Bonilla is a 59 y.o. female with PMH unspecified anxiety disorder, atrial fibrillation, WPW, idiopathic mast cell activation, anemia, asthma, hypothyroidism, GERD, intermittent chronic HA, PNES, presents for follow-up. Last seen on/by 03/29/2019 Chari Parmenter L. Deloria Brassfield, MD.    HPI     About to do six weeks of respite care to try and qualify for a waiver for assisted living due to her home having mold associated with the plumbing. Has been very stressed and anxious about all of the paperwork that has gone into trying to get a waiver and to get an assisted living apartment. Mother has metastases from her kidney cancer to her lungs, has an appointment today to figure out the options. Has been worrying about this move and her mother's health, as well as issues with sister who her mother lives with who has not been helpful with managing her mother's  care. Patient has been the one coordinating her mother's care and so this has been added stress on her. Feels that she is staying on top of options for housing, but due to increased anxiety has been utilizing clonazepam 3 times a day most days, finds it helpful still. Has about 4 tablets left. Denies SI, HI, AVH.    Patient History:     Review of Systems   Constitutional: Negative for chills and fever.   HENT: Negative for congestion.    Eyes: Negative for visual disturbance.   Respiratory: Negative for cough.    Cardiovascular: Negative for chest pain.   Gastrointestinal: Negative for abdominal distention, abdominal pain, diarrhea, heartburn and vomiting.   Genitourinary: Negative for dysuria.   Musculoskeletal: Negative for back pain and myalgias.   Skin: Negative for rash.   Neurological: Negative for headaches.       Allergies:   Acetaminophen, Adhesive tape-silicones, Alcohol, Amitriptyline, Aspirin, Benzoic acid, Blue dye, Flecainide, Gabapentin, Iodinated contrast media, Isopropyl alcohol, Latex, Levofloxacin, Lidocaine, Nsaids (non-steroidal anti-inflammatory drug), Onion, Other, Paraben, Perfume, Pregabalin, Preservative, Quetiapine, Red dye, Sulfa (sulfonamide antibiotics), Sulfur, Trazodone, Verapamil, Yellow dye, Green dye, Onion extract, Doxepin, Duloxetine, Epi e-z pen, Hydroxyzine, Statins-hmg-coa reductase inhibitors, and Verapamil (bulk)    Medications:     Outpatient Medications Prior to Visit   Medication Sig Dispense Refill   ??? blood-glucose meter Misc Use meter to test blood glucose twice daily 1 each 0   ??? busPIRone (BUSPAR) 10 MG tablet Take 2 tablets (20 mg total) by mouth 3 times a day. 180 tablet  2   ??? cetirizine (ZYRTEC) 10 mg Cap Take 10 mg by mouth daily. 30 capsule 2   ??? clonazePAM (KLONOPIN) 0.5 MG tablet Take 1 tablet (0.5 mg total) by mouth 3 times a day as needed for Anxiety. 1 tablet each morning, and 2 at bedtime. Indications: anxiety 75 tablet 2   ??? cromolyn (GASTROCROM) 100 mg/5  mL solution Take 5 mLs (100 mg total) by mouth 4 times daily before meals and at bedtime. 480 mL 11   ??? cyanocobalamin, vitamin B-12, (VITAMIN B-12 INJ) Inject 0.5 mLs as directed.     ??? diphenhydrAMINE (BENADRYL) 25 mg capsule Take 3-6 capsules (75-150 mg total) by mouth daily. 90 capsule 2   ??? doxepin (SINEQUAN) 10 MG capsule TAKE ONE (1) CAPSULE BY MOUTH AT BEDTIME. 30 capsule 0   ??? dulaglutide 1.5 mg/0.5 mL PnIj Inject 1.5 mg subcutaneously every 7 days. 6 mL 3   ??? EASY TOUCH TWIST LANCETS 30 gauge Misc USE TWICE A DAY AS DIRECTED. 100 each 11   ??? famotidine (PEPCID) 40 MG tablet Take 40 mg by mouth 2 times a day.     ??? fexofenadine (ALLEGRA) 180 MG tablet Take 1 tablet (180 mg total) by mouth daily. 30 tablet 3   ??? fluticasone propionate (FLONASE) 50 mcg/actuation nasal spray Use 1 spray into each nostril daily. 16 g 1   ??? furosemide (LASIX) 20 MG tablet TAKE ONE TABLET BY MOUTH ONCE A DAY. 30 tablet 0   ??? hydrOXYzine HCL (ATARAX) 25 MG tablet Take 1 tablet (25 mg total) by mouth every 8 hours as needed for Anxiety. May take additional 25mg  at bedtime. 120 tablet 2   ??? lancets Misc USE TO TEST BLOOD SUGAR 2 TIMES DAILY- Easy touch twist 30g lancets 100 each 0   ??? levalbuterol (XOPENEX) 1.25 mg/3 mL nebulizer solution Inhale 3 mL (1.25 mg total) by nebulization every 6 hours as needed for Wheezing. 72 mL 0   ??? levothyroxine (SYNTHROID) 137 MCG tablet Take 1 tablet (137 mcg total) by mouth daily. 90 tablet 3   ??? montelukast (SINGULAIR) 10 mg tablet TAKE ONE TABLET BY MOUTH AT BEDTIME. 30 tablet 0   ??? nebulizers (COMPACT COMPRESSOR NEBULIZER) Misc Please dispense 1 nebulizer with tubing and mouthpiece. 1 each 0   ??? omeprazole (PRILOSEC) 40 MG capsule TAKE 1 CAPSULE BY MOUTH 2 TIMES DAILY BEFORE MEALS 60 capsule 5   ??? onabotulinumtoxinA (BOTOX COSMETIC) 50 unit SolR Inject into the muscle.     ??? triamcinolone (KENALOG) 0.1 % ointment APPLY 2 TIMES DAILY 80 g 11   ??? TRUE METRIX GLUCOSE TEST STRIP Strp USE TO TEST  BLOOD SUGAR 2 TIMES DAILY 50 strip 11   ??? UNABLE TO FIND COMPOUNDED METHYLCOBALAMIN 1000 MCG EVERY OTHER DAY BY INJECTION/ TRISTATE 1000 mL 11   ??? VITAMIN D3-VITAMIN K2, MK4, ORAL Take by mouth.       No facility-administered medications prior to visit.       Objective:   Physical Exam  There were no vitals filed for this visit.    Gait and Stations: Not accessed via video    Mental Status Exam:   Appearance: appears stated age, casually dressed, grooming and hygiene fair  Behavior/Movement: polite, cooperative.   Speech: regular rate, rhythm, tone and prosody.  Naming and repeating intact.  No aphasia. Production within normal limit. Fluent and nonpressured.  Mood:  stressed  Affect: anxious but pleasant, full range, mood congruent, content appropriate  Thought Process: organized and linear without any flight of ideas or loosening of associations.  Thought Content: Denies suicidal ideation, denies homicidal ideation. No ruminations or obsessions, no erotomanic, grandiose, persecutory, or bizarre delusions were noted.   Perceptions: Denies auditory hallucinations, denies visual hallucinations. Not seen responding to internal stimuli on camera.   Cognition:   -Sensorium: alert   -Orientation: oriented to person, place, situation and year   -Memory: grossly intact    -Fund Of Knowledge: appears consistent with average intellect based on vocabulary,    -Insight: good    -Judgement: good    PHQ-9 Scores:   PHQ Total Score 03/30/2016 05/21/2016 06/24/2016 08/26/2016 09/29/2017   PHQ-9 Total Score 0 0 0 9 7       GAD7 Scores:   GAD7Total Score 08/26/2016 11/25/2016 02/03/2017 04/14/2017 09/29/2017   GAD-7 Total Score 10 6 2 6 4           Assessment/Plan:   59 y.o. Caucasian female, history of  unspecified anxiety disorder, atrial fibrillation, WPW, idiopathic mast cell activation, anemia, asthma, hypothyroidism, GERD, intermittent chronic HA, PNES, presents for follow up. Reports mostly manageable anxiety with current regimen, though  recent exacerbations in anxiety due to issue with plumbing necessitating a move into respite care and mother's cancer spreading to lungs. Stressors have exacerbated anxiety. Is utilizing benzodiazepines to manage anxiety, utilizing full prescription of 75 tablets. Would benefit from temporary increase to 90 tablets until acute stressors subside. Due to patient's extensive allergy list and significant anaphylaxis history, will continue current regimen with slight increase in number of clonazepam tablets dispensed.    Diagnoses:  -Somatic symptom disorder  -Unspecified anxiety disorder, moderate  -Cluster B personality traits        # Anxiety, moderate  -Continue Buspirone 20 mg TID for anxiety  -Continue Hydroxyzine 25 mg TID, 50mg  QHS for anxiety  -Klonopin 0.5mg  TID, #90 tablets, 1RF  -Continue treatment with integrative medicine for biofeedback, massage therapy and acupuncture  -Self-management: meditation, yoga  -RTC in 6 weeks with new resident via video  ??  Discussed risks/benefits/side effects/alternatives to medications. Discussed the risk of cognitive decline with long-term use of anti-histamine. Patient expressed understanding of and agreement to the current treatment plan.   ??  # Safety: The patient's imminent risk appears low, as she denies SI, does not express HI, no previous SA, future-oriented and goal-oriented to continue treatment. Patient expressed understanding of resources, including 911 and PES.  ??  # Substance:  -No active issues  ??  # Medical:  -Follow-up with PCP      Elvera Bicker, MD  Psychiatry PGY-3    Patient seen and plan discussed and agreed upon by attending Dr. Thomasena Edis    Portions of this note were copied forward from a previous note, changes have been made throughout to accurately reflect elements obtained from today's evaluation.

## 2019-07-12 NOTE — Telephone Encounter (Signed)
lov-03/29/2019

## 2019-07-19 MED ORDER — furosemide (LASIX) 20 MG tablet
20 | ORAL_TABLET | Freq: Every day | ORAL | 0 refills | Status: AC
Start: 2019-07-19 — End: 2020-06-27

## 2019-07-19 MED ORDER — furosemide (LASIX) 20 MG tablet
20 | ORAL_TABLET | Freq: Every day | ORAL | 3 refills | Status: AC
Start: 2019-07-19 — End: 2019-11-02

## 2019-07-19 NOTE — Unmapped (Signed)
Last visit 10/16/2018 Court Joy, MD     Future Appointments   Date Time Provider Department Center   08/03/2019  9:30 AM Stefan Church, CNP [facility] NEUR GNI UCGNI   10/09/2019 10:00 AM [facility] DEXA [facility]   10/18/2019 10:00 AM Orbie Pyo, MD [facility] MAB MAB   10/24/2019 10:30 AM Glenna Fellows, MD [facility] GAST [facility]     Requested Prescriptions     Pending Prescriptions Disp Refills   ??? furosemide (LASIX) 20 MG tablet [Pharmacy Med Name: FUROSEMIDE 20 MG TAB] 30 tablet 0     Sig: TAKE ONE TABLET BY MOUTH ONCE A DAY.       Pain Flowsheet (Urine Drug/OARRS-eKASPER/Narcotic) 03/21/2018 09/14/2018 03/29/2019 07/12/2019   OARRS/eKASPER Status Reviewed Reviewed Reviewed Reviewed   OARRS/eKASPER Consistent with Prescriber Expectation Nolon Rod        Allergies and pharmacy verified.     Forwarding to provider for approval or denial

## 2019-07-27 ENCOUNTER — Ambulatory Visit: Payer: PRIVATE HEALTH INSURANCE | Attending: Family

## 2019-07-31 NOTE — Unmapped (Signed)
Received COA for In-home care services and assisted living waiver dated 07/19/19.Placed in MD's box for signature. F. Sorrells

## 2019-07-31 NOTE — Unmapped (Signed)
Completed paperwork and faxed to number given then scanned to medical records Felicia Sorrells 07/31/2019 1:20 PM

## 2019-08-03 ENCOUNTER — Ambulatory Visit: Admit: 2019-08-03 | Payer: PRIVATE HEALTH INSURANCE | Attending: Family

## 2019-08-03 DIAGNOSIS — G43719 Chronic migraine without aura, intractable, without status migrainosus: Secondary | ICD-10-CM

## 2019-08-03 MED ORDER — botulinum toxin A (BOTOX) injection 200 Units
100 | Freq: Once | INTRAMUSCULAR | Status: AC
Start: 2019-08-03 — End: 2019-08-03
  Administered 2019-08-03: 14:00:00 200 [IU] via INTRAMUSCULAR

## 2019-08-03 MED ORDER — diphenhydrAMINE (BENADRYL) injection 50 mg
50 | Freq: Once | INTRAMUSCULAR | Status: AC
Start: 2019-08-03 — End: 2019-08-03
  Administered 2019-08-03: 14:00:00 50 mg via INTRAMUSCULAR

## 2019-08-03 MED ORDER — botulinum toxin A (BOTOX) 100 unit injection
100 | INTRAMUSCULAR | Status: AC
Start: 2019-08-03 — End: 2019-08-03

## 2019-08-03 MED ORDER — diphenhydrAMINE (BENADRYL) 50 mg/mL injection
50 | INTRAMUSCULAR | Status: AC
Start: 2019-08-03 — End: 2019-08-03

## 2019-08-03 MED FILL — BOTOX 100 UNIT INJECTION: 100 100 unit | INTRAMUSCULAR | Qty: 2

## 2019-08-03 MED FILL — DIPHENHYDRAMINE 50 MG/ML INJECTION SOLUTION: 50 50 mg/mL | INTRAMUSCULAR | Qty: 1

## 2019-08-03 NOTE — Unmapped (Signed)
MRN:  [id number]  Date: 08/03/2019   Norely Schlick is a 59 y.o. female who comes in for scheduled Botox injection.      BOTOX PREEMPT PROTOCOL     Total headache days per month: 8-12/mo.  Total days headache free per month: 18  Severity of headaches: moderate  Botox effective: Yes -  She was having 30/mo headaches.  This is a reduction of 18-22/mo!      The risks, benefits and anticipated outcomes of the procedure, the risks and benefits of the alternatives to the procedure, and the roles and tasks of the personnel to be involved, were discussed with the patient.     The side effects of onabotulinum toxin used for the treatment of chronic migraine have been described to the patient in detail.  These include: ptosis, neck pain, neck weakness, anaphylaxis, cellulitis, systemic spread (death, respiratory failure, dysarthria, dysphagia, diplopia), blood born illnesses (HIV, hepatitis, mad cow disease).   The patient is aware that any side effects related to muscle paralysis could last for 3 months.     The patient has given written informed consent to the procedure and agrees to proceed.Yes     Treatment # 9   Dilution: 5 units/0.1 ml (200 U of botox was mixed with 4 ccs of bacteriostatic saline.)  Indication: Chronic Migraine        Injection Sites:  0.1 ccs of this solution was injected into the following:  Muscle    Fixed Site/Fixed Dose  L      R   Corrugator??????????????????????????????????????10 Units divided in 2 sites??????????????????????????????????????????  ??  Procerus ??????????????????????????????????????????5 Units in 1 site ????????????????????????????????????????????????????????????????????????  ??  Frontalis????????????????????????????????????????????20 Units divided in 4 sites????????????????????????????????????????????  ??  Temporalis ??????????????????????????????????40 Units divided in 8 sites ????????????????????????????????????????  ??  Occipitalis ??????????????????????????????????????30 Units divided in 6 sites ????????????????????????????????????????  ??  Cervical PSPs??????????????????????????20 Units divided in 4 sites??????????????????????????????????????????  ??  Trapezius????????????????????????????????????????30 Units divided in 6 sites????????????????????????????????????  12.5 units extra left trapezius in 5 divided doses  ???????????????????????????????????????????????????????????????????????????????????????????????????????????????????????????????????????????????????????????????????????????????????????????? ??????????12.5 units extra right trapezius in 5 divided doses?????????????????????????????????????????????????????????????????????????????????????????????????????????????????????????????????????????????????????????????????????????? ??????  ??  ??  Total Units used:180  Total Units wasted:20??  Patient??did??tolerate procedure.??  ??  Change in Treatment Plan?no  ??  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  Benadryl 50 mg IM given in office.  RTC: 3 Months, For Botox and PRN    Stefan Church, CNP

## 2019-08-13 MED ORDER — TRUEPLUS LANCETS 30 gauge Misc
30 | 3 refills | 30.00000 days | Status: AC
Start: 2019-08-13 — End: 2020-05-01

## 2019-08-13 NOTE — Unmapped (Signed)
Last office visit with this provider: 10/12/2017 Loyal Jacobson, CNP  Last office visit at this location and with whom: 05/17/2019 Orbie Pyo, MD  Next scheduled office visit at this location and with whom: 10/18/2019 Orbie Pyo, MD

## 2019-08-29 NOTE — Unmapped (Signed)
From: Aubriella Perezgarcia  Sent: 08/29/2019 3:34 PM EDT  To: [facility]  Subject: RE: COVID19 immunization    Good Afternoon,    I have researched all the COVID jabs. They all have a preservative I react to. I have been concerned about reacting to the Covid Jab. I had a reaction to a pneumonia shot years ago. I have not taken a pneumonia, flu shot in over 10 years.   I isolate during flu season.   I would take the Covid shot if I could be assured I would not react/shock to it. I have no problems with vaccinations in general.   I am tested weekly/monthly where I live depending on the alert level. Nurses are tested 2 times a week. I eat in my room and stay isolated away from other residents. I enjoy going outside [place]. I have my own food and appliances.   I'll talk again to my Allergist.   Thank you

## 2019-08-30 ENCOUNTER — Ambulatory Visit
Admit: 2019-08-30 | Discharge: 2019-08-30 | Payer: PRIVATE HEALTH INSURANCE | Attending: Student in an Organized Health Care Education/Training Program

## 2019-08-30 DIAGNOSIS — F419 Anxiety disorder, unspecified: Secondary | ICD-10-CM

## 2019-08-30 MED ORDER — busPIRone (BUSPAR) 10 MG tablet
10 | ORAL_TABLET | Freq: Three times a day (TID) | ORAL | 2 refills | Status: AC
Start: 2019-08-30 — End: 2019-11-01

## 2019-08-30 MED ORDER — clonazePAM (KLONOPIN) 0.5 MG tablet
0.5 | ORAL_TABLET | Freq: Three times a day (TID) | ORAL | 1 refills | Status: AC | PRN
Start: 2019-08-30 — End: 2019-09-29

## 2019-08-30 NOTE — Unmapped (Addendum)
You were seen in the [facility] via telehealth. One of your medications was changed - clonazepam 0.5 mg TID PRN. We decreased the amount of pills from 90 tablets to 75 tablets. You may take this medication as needed for acute anxiety.

## 2019-08-30 NOTE — Unmapped (Signed)
[facility]  [facility]  Progress Note     Emily Bonilla  08-24-1960  08/30/2019    This was a Video visit, including two-way audio and video communication, in lieu of an in-person visit.I have discussed, with Emily Bonilla the risks associated with appointments using video platforms, which includes the risk of breach of PHI due to technology limitations.  I have also discussed the benefit of getting immediate access to care during this crisis.  The patient provided informed consent for this appointment via Epic/MyChart which includes the use of synchronous audio/video.    ??  I spent 25 minutes speaking with the patient, conducting an interview, performing a limited exam, and educating the patient on my assessment and plan. I also spent 7 minutes, on the same day as the encounter, preparing to see the patient (eg, review of tests), ordering medications, tests, or procedures and documenting clinical information in the electronic or other health record.    Chief Complaint:     Follow up    Interval History:     Emily Bonilla is a 59 y.o. White or Caucasian female with a history of unspecified anxiety disorder, atrial fibrillation, WPW, idiopathic mast cell activation, anemia, asthma, hypothyroidism, GERD, intermittent chronic HA, PNES who presents to [facility] on 08/30/2019 for follow up medication management. Last seen by Elvera Bicker, MD on 07/12/2019. Current medications include: buspirone 20 mg TID, hydroxyzine 25 mg TID, 50 mg QHS clonazepam 0.5mg  TID    Pt asked to summarize events since last visit. Has moved into assisted living facility [facility]) - transition went well emotionally. Struggled physically with the move due to her exercise induced asthma and several anaphylactic attacks. Cites certain foods and light activity as triggers. Has otherwise been exhausted but doing well mentally and emotionally and enjoys the facility.  Has a companion who  visits her daily for social interaction - pt really enjoys her company. Spends most of her time in her room due to the COVID-19 pandemic but discusses in detail how sizable her room and does not feel confined by this.     Discusses her illness community and recent suicides by 2 members who not friendly acquaintances. She expresses wanting to stay on top of her mental health to avoid becoming depressed or suicidal due to her many illnesses. Is also seeing an [facility] allergist [address] [place], [place]) who is managing these illnesses - notes reviewed in Care Everywhere. Recently started hydroxychloroquine.     Had 3 episodes of breakthrough anxiety in the past 5 weeks due to an acute stressor, particularly with her mother who preparing to enter hospice. Is taking clonazepam TID and has the nurses bring this to her since it is controlled medication. Reports decreased panic episodes and night sweats in the AM. Is also taking the hydroxyzine 25 mg TID PRN regularly due to the his 5-HT properties being effective with her asthma and mastocytosis.     Psychiatric Review of Symptoms:     The patient denies neurovegetative symptoms of depression including: -anhedonia, -decreased energy, -decreased sleep, -guilt/low self-esteem, -decreased concentration, -appetitte changes, -sleep changes, -PMR/PMA, -SI.    The patient denies manic symptoms including: -elevated mood, -increased energy, -decreased need for sleep, -rapid speech, -racing thoughts, -impulsivity, -grandiosity, -severe irritability.    The patient denies psychotic symptoms including: -evidence of delusional thinking, -hallucinations.    Changes to Psychiatric/Social/Substance History:     No updates made    Past Medical  and Past Surgical History     Past Medical History:   Diagnosis Date   ??? Anaphylaxis    ??? Anemia    ??? Anxiety    ??? Asthma    ??? Atrial fibrillation (CMS Dx)    ??? Bronchitis    ??? Heart disease    ??? Neurological disease    ??? OSA (obstructive sleep apnea)      states resolved   ??? Osteoporosis    ??? Psychogenic nonepileptic seizure    ??? Seizures (CMS Dx)    ??? Spontaneous pneumothorax     states pneumomediatsium/pneumopericardium, self resolved   ??? Thyroid disease    ??? Ulcer    ??? WPW (Wolff-Parkinson-White syndrome)        Past Surgical History:   Procedure Laterality Date   ??? BONE MARROW BIOPSY      benign   ??? CARDIAC ELECTROPHYSIOLOGY MAPPING AND ABLATION      attempted x 2, states not successful either time   ??? CHOLECYSTECTOMY  2015   ??? DENTAL EXTRACTION Right 04/25/2017    Procedure: EXTRACTION TOOTH # 31, BRIDGE RESECTIONING;  Surgeon: Gladys Damme, DMD;  Location[facility];  Service: Oral Surgery;  Laterality: Right;   ??? FACIAL COSMETIC SURGERY     ??? HYSTERECTOMY  2009   ??? INSERTION CATHETER VASCULAR ACCESS N/A 09/06/2017    Procedure: INSERTION POWER PORT A CATH;  Surgeon: Penni Bombard, MD;  Location: [facility];  Service: General;  Laterality: N/A;   ??? LIVER BIOPSY      benign hepatic adednoma per patient   ??? REMOVAL HARDWARE LEG Left 08/17/2016    Procedure: REMOVAL OF HARDWARE LEFT TIBIA;  Surgeon: Andee Lineman, MD;  Location: [facility];  Service: Orthopedics;  Laterality: Left;   ??? TIBIA FRACTURE SURGERY     ??? TONSILLECTOMY      + adenoids       Allergies     Allergies   Allergen Reactions   ??? Acetaminophen Anaphylaxis     Unknown   ??? Adhesive Tape-Silicones Itching, Rash, Swelling and Anaphylaxis     Unknown   ??? Alcohol Anaphylaxis   ??? Amitriptyline Anaphylaxis   ??? Aspirin Anaphylaxis     Unknown   ??? Benzoic Acid Anaphylaxis   ??? Blue Dye Anaphylaxis     Unknown   ??? Flecainide Shortness Of Breath and Anaphylaxis     Itching, decreased libido, wheezing, flushing, numbness.    ??? Gabapentin Anaphylaxis   ??? Iodinated Contrast Media Anaphylaxis   ??? Isopropyl Alcohol Anaphylaxis   ??? Latex Anaphylaxis and Rash   ??? Levofloxacin Itching and Anaphylaxis   ??? Lidocaine Anaphylaxis   ??? Nsaids (Non-Steroidal Anti-Inflammatory Drug) Anaphylaxis   ??? Onion Anaphylaxis   ??? Other  Anaphylaxis     HAS MAST CELL ACTIVATION SYNDROME, SO PATIENT HAS ANAPHYLACTIC REACTIONS IF BODY RECOGNIZES TRIGGERS  CANNOT TOLERATE ANY DYES, PRESERVATIVES, PARABENS, ETC.  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   ??? Paraben Anaphylaxis   ??? Perfume Anaphylaxis   ??? Pregabalin Anaphylaxis   ??? Preservative Anaphylaxis   ??? Quetiapine Anaphylaxis     Mental Status Change  Other reaction(s): Angioedema   ??? Red Dye Anaphylaxis     Unknown   ??? Sulfa (Sulfonamide Antibiotics) Anaphylaxis   ??? Sulfur Anaphylaxis     Pt states: Any alcohol product   ??? Trazodone Rash and  Anaphylaxis   ??? Verapamil Anaphylaxis   ??? Yellow Dye Anaphylaxis     Unknown   ??? Green Dye Rash     Other reaction(s): Contact Dermatitis   ??? Onion Extract Other (See Comments)   ??? Doxepin Other (See Comments)     Sedation and sleep paralysis (side effect)   ??? Duloxetine      Other reaction(s): Other (See Comments)  Mental Status Change   ??? Epi E-Z Pen    ??? Hydroxyzine Itching     Unknown   ??? Statins-Hmg-Coa Reductase Inhibitors    ??? Verapamil (Bulk) Hives     Insomnia, flushing, scattered brain, migraine, itching, anxiety.      Home Medications:     Current Outpatient Medications   Medication Sig   ??? blood-glucose meter Use meter to test blood glucose twice daily   ??? busPIRone Take 2 tablets (20 mg total) by mouth 3 times a day.   ??? ZyrTEC Take 10 mg by mouth daily.   ??? clonazePAM Take 1 tablet (0.5 mg total) by mouth 3 times a day as needed for Anxiety. Indications: anxiety   ??? cromolyn Take 5 mLs (100 mg total) by mouth 4 times daily before meals and at bedtime.   ??? cyanocobalamin, vitamin B-12, (VITAMIN B-12 INJ) Inject 0.5 mLs as directed.   ??? diphenhydrAMINE Take 3-6 capsules (75-150 mg total) by mouth daily.   ??? doxepin TAKE ONE (1) CAPSULE BY MOUTH AT BEDTIME.   ??? dulaglutide Inject 1.5 mg subcutaneously every 7 days.   ??? famotidine  Take 40 mg by mouth 2 times a day.   ??? fexofenadine Take 1 tablet (180 mg total) by mouth daily.   ??? fluticasone propionate Use 1 spray into each nostril daily.   ??? furosemide Take 1 tablet (20 mg total) by mouth daily.   ??? furosemide Take 1 tablet (20 mg total) by mouth daily.   ??? hydrOXYchloroQUINE Take by mouth daily with breakfast.   ??? hydrOXYzine HCL Take 1 tablet (25 mg total) by mouth every 8 hours as needed for Anxiety. May take two tablets at bedtime.   ??? levalbuterol Inhale 3 mL (1.25 mg total) by nebulization every 6 hours as needed for Wheezing.   ??? levothyroxine Take 1 tablet (137 mcg total) by mouth daily.   ??? montelukast TAKE ONE TABLET BY MOUTH AT BEDTIME.   ??? Compact Compressor Nebulizer Please dispense 1 nebulizer with tubing and mouthpiece.   ??? omeprazole TAKE 1 CAPSULE BY MOUTH 2 TIMES DAILY BEFORE MEALS   ??? onabotulinumtoxinA (cosmetic) Inject into the muscle.   ??? triamcinolone APPLY 2 TIMES DAILY   ??? True Metrix Glucose Test Strip USE TO TEST BLOOD SUGAR 2 TIMES DAILY   ??? TRUEplus Lancets USE TWICE A DAY AS DIRECTED.   ??? UNABLE TO FIND COMPOUNDED METHYLCOBALAMIN 1000 MCG EVERY OTHER DAY BY INJECTION/ TRISTATE   ??? VITAMIN D3-VITAMIN K2, MK4, ORAL Take by mouth.     No current facility-administered medications for this visit.       ROS:     Denies headache, nausea, vomiting, blurry vision, chest pain, SOB, abdominal pain, diarrhea, constipation, dysuria, dizziness, new rash    Vitals:     There were no vitals filed for this visit.    Wt Readings from Last 3 Encounters:   08/03/19 212 lb 1.6 oz (96.2 kg)   07/02/19 215 lb 14.4 oz (97.9 kg)   06/28/19 214 lb (97.1 kg)  There is no height or weight on file to calculate BMI.    Physical Exam     Constitutional: She is oriented to person, place, and time. She is well-developed, well-nourished, and in no acute distress.    Head: Normocephalic and atraumatic.   Eyes: EOM are normal.   Neck: Normal range of motion.   Pulmonary/Chest: Effort normal. No  respiratory distress.   Musculoskeletal: Normal range of motion.   Neurological: She is alert and oriented to person, place, and time.   Skin: Skin is dry and intact. No rash noted.     Mental Status Exam:     Appearance: appears stated age, good eye contact, cooperative with interview  Movement: UTA due to video visit  Speech: fluent, with normal tone, volume, and rate  Language: appropriate to interview  Mood/Affect: euthymic/ full range, mood congruent, content appropriate  Thought Process: goal directed, generally linear, no FOI  Associations: no LOA  Thought Content: Denies SI/HI, contracting for safety and making appropriate future plans; no delusions expressed  Perceptions: no evidence of psychotic thoughts; no AH/VH and not RTIS; denies neurovegetative symptoms  Orientation: alert, oriented to person place and situation  Fund Of Knowledge: grossly normal  Attention and Concentration: normal  Recent and Remote Memory: grossly intact    Insight/Judgment: fairly good/generally good, by recent history    Labs and Imaging:     No results found for this or any previous visit (from the past 336 hour(s)).    Risk Assessment:     Risk Assessment:   Risk factors for violence to self/others: unemployed, co-morbid anxiety, chronic medical illness, chronic pain    Protective factors include: female, no previous suicide attempts, life satisfaction, positive social support, denies hopelessness, no history of trauma, no psychosis, no cognitive dysfunction, no substance use, employed, denies SI/HI, future orientation, goal orientation, contracting for safety, sobriety, collateral information is reassuring, close outpatient follow-up, compliant with recommended medications, no access to guns or weapons    Overall the risk: low for future dangerousness; however the patient's imminent risk is low as the patient now: denies SI/HI, intent or plan, is clinically sober, future oriented, contracting for safety, has outpatient  psychiatric follow-up established, has improved social support. As such, the patient does not meet criteria for admission to inpatient psychiatry. They remain appropriate for an outpatient level of care.    Assessment and Plan:     59 y.o. Caucasian female, history of unspecified anxiety disorder, atrial fibrillation, WPW, idiopathic mast cell activation, anemia, asthma, hypothyroidism, GERD, intermittent chronic HA, PNES, presents for follow up. Reports decrease in acute anxiety episodes and is coping well with psychosocial stressors (moving, mother's cancer diagnosis). Clonazepam temporarily increased from prescription of 75 tablets to 90 tablets at last visit. Will decrease to 75 tabs due to dissolution of acute stressors of moving and monitor. Will continue other medications as prescribed. Remains appropriate for outpatient care.   ??  Diagnoses:  -Somatic symptom disorder  -Unspecified anxiety disorder, moderate  -Cluster B personality traits  ??  # Unspecified anxiety, in partial remission   - Continue Buspirone 20 mg TID for anxiety   - Continue Hydroxyzine 25 mg TID, 50mg  QHS for anxiety   - Decrease clonazepam 0.5mg  TID from 90 tablets to 75 tablets total (75 tabs, 1 refill sent to pharmacy)    - Continue treatment with integrative medicine for biofeedback, massage therapy and acupuncture   - Self-management: meditation, yoga   - Discussed all prescribed medications including  risks/benefits/side effects/alternatives. Patient expressed understanding and agreement with the current treatment plan  - OARRS report run on 08/30/2019. Appropriate and consistent with previous prescriptions.     # Safety:   - The patient???s imminent risk is low, see risk assessment above. At this time, does not meet criteria for involuntary psychiatric hospitalization and is appropriate for outpatient level of care.   - Reviewed emergency contact protocol during and after clinic hours   - Patient expressed understanding of emergency  resources including PES, 911, and the suicide hotline    # Substance use: No active issues     # Medical: Continue regular follow up with PCP, sub specialists      Return to clinic in 6-8 weeks  Patient will be discussed in ongoing supervision with Janeth Rase, MD.    Ronn Melena, MD  PGY-3 Psychiatry  [facility]

## 2019-10-09 ENCOUNTER — Other Ambulatory Visit: Admit: 2019-10-09 | Payer: PRIVATE HEALTH INSURANCE

## 2019-10-09 ENCOUNTER — Inpatient Hospital Stay: Admit: 2019-10-09 | Payer: PRIVATE HEALTH INSURANCE | Attending: "Endocrinology

## 2019-10-09 DIAGNOSIS — E063 Autoimmune thyroiditis: Secondary | ICD-10-CM

## 2019-10-09 LAB — LIPID PANEL
Cholesterol, Total: 215 mg/dL (ref 0–200)
HDL: 38 mg/dL (ref 60–92)
LDL Cholesterol: 137 mg/dL
Triglycerides: 200 mg/dL (ref 10–149)

## 2019-10-09 LAB — RENAL FUNCTION PANEL W/EGFR
Albumin: 4.2 g/dL (ref 3.5–5.7)
Anion Gap: 9 mmol/L (ref 3–16)
BUN: 11 mg/dL (ref 7–25)
CO2: 30 mmol/L (ref 21–33)
Calcium: 9 mg/dL (ref 8.6–10.3)
Chloride: 108 mmol/L (ref 98–110)
Creatinine: 0.86 mg/dL (ref 0.60–1.30)
Glucose: 131 mg/dL — ABNORMAL HIGH (ref 70–100)
Osmolality, Calculated: 305 mosm/kg (ref 278–305)
Phosphorus: 3.5 mg/dL (ref 2.1–4.7)
Potassium: 4.1 mmol/L (ref 3.5–5.3)
Sodium: 147 mmol/L — ABNORMAL HIGH (ref 133–146)
eGFR AA CKD-EPI: 86 See note.
eGFR NONAA CKD-EPI: 74 See note.

## 2019-10-09 LAB — ENDOMYSIAL ANTIBODY, IGA TITER: Endomysial IgA: NEGATIVE

## 2019-10-09 LAB — GLIADIN ANTIBODIES, SERUM
Gliadin IgA: 2 units (ref 0–19)
Gliadin IgG: 2 units (ref 0–19)

## 2019-10-09 LAB — VITAMIN D 25 HYDROXY: Vit D, 25-Hydroxy: 30.9 ng/mL (ref 30.0–100.0)

## 2019-10-09 LAB — PTH, INTACT: PTH: 155 pg/mL — ABNORMAL HIGH (ref 12.0–88.0)

## 2019-10-09 LAB — IGA: IgA: 127 mg/dL (ref 70.0–400.0)

## 2019-10-09 LAB — MICROALBUMIN (RANDOM UR)
Creatinine, Urine: 29.2 mg/dL
Microalb, Ur: <7 mg/L (ref 0.0–17.0)

## 2019-10-09 LAB — TISSUE TRANSGLUTAMINASE, IGA: Transglutaminase IgA: <2 U/mL (ref 0–3)

## 2019-10-09 LAB — HEMOGLOBIN A1C: Hemoglobin A1C: 6.5 % — ABNORMAL HIGH (ref 4.0–5.6)

## 2019-10-09 LAB — T3: T3, Total: 94.6 ng/dL (ref 60.0–220.0)

## 2019-10-09 LAB — TSH: TSH: 7.7 u[IU]/mL — ABNORMAL HIGH (ref 0.45–4.12)

## 2019-10-09 LAB — T4, FREE: Free T4: 0.78 ng/dL (ref 0.61–1.76)

## 2019-10-12 MED ORDER — omeprazole (PRILOSEC) 40 MG capsule
40 | ORAL_CAPSULE | ORAL | 5 refills | Status: AC
Start: 2019-10-12 — End: 2020-05-07

## 2019-10-12 MED ORDER — levothyroxine (SYNTHROID) 175 MCG tablet
175 | ORAL_TABLET | Freq: Every day | ORAL | 3 refills | Status: AC
Start: 2019-10-12 — End: 2020-07-29

## 2019-10-18 ENCOUNTER — Ambulatory Visit: Admit: 2019-10-18 | Discharge: 2019-10-18 | Payer: PRIVATE HEALTH INSURANCE | Attending: "Endocrinology

## 2019-10-18 DIAGNOSIS — E213 Hyperparathyroidism, unspecified: Secondary | ICD-10-CM

## 2019-10-18 MED ORDER — cholecalciferol, vitamin D3, 125 mcg (5,000 unit) capsule
125 | ORAL_CAPSULE | ORAL | 3 refills | 30.00000 days | Status: AC
Start: 2019-10-18 — End: ?

## 2019-10-18 NOTE — Unmapped (Signed)
Continue levothyroxine daily     - please go to the lab to check TFTs in 4 weeks.     Add vitamin D 5000units 3x weekly    - increase Trulicity to 3.0mg  weekly.     Follow-up in 6 months.

## 2019-10-18 NOTE — Unmapped (Signed)
Patient ID:  59 y.o. female who presents for management of osteopenia, diabetes and hypothyroidism    This was a Video visit, including two-way audio and video communication, in lieu of an in-person visit. The patient provided verbal consent to participate in the telehealth visit.   I spent 35 minutes speaking with the patient, conducting an interview, performing a limited exam, and educating the patient on my assessment and plan. I also spent 7 minutes, on the same day as the encounter, preparing to see the patient (eg, review of tests), ordering medications, tests, or procedures, documenting clinical information in the electronic or other health record and performing non-face-to-face activities.    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       She reported being diagnosed with osteopenia in 2010 on outside DXA scan. Her fracture history includes a fractured tibia and fibula from falling down 2 steps after passing out.   DXA scan on 10/18/17 showed lowest T-score of -1.9 at the L femoral neck, and -0.7, +0.1, and -0.7 at the lumbar spine, L total hip and L 1/3 radius, respectively.     She has a history of vitamin D deficiency and secondary hyperparathyroidism, which had improved with vitamin D supplementation.  Labs on 06/02/17 showed vitamin D=10.5 and PTH=151pg/mL on 06/02/2017.  She was prescribed ergocalciferol but could not tolerate and was switched to vitamin D3 5000units daily.  Vitamin D=28.3 on 08/25/17.  Labs on 11/10/17 showed vitD=37.7ng/mL, and PTH=79pg/mL.    Labs on 10/19/18 showed Ca=9.2mg /dL, UJW=1.19JYN/WG, NFA=213YQ/MV, A1c=7.4%.    She also has a history of hypothyroidism d/t hashimoto's disease on levothyroxine. She saw Dr. Angelia Mould on 06/29/2017, who performed in-office ultrasound that showed heterogenous thyroid with no discrete nodule, c/w  thyroiditis.     She was diagnosed with prediabetes in 2017, but had rapid weight gain in early 2019 and progressed to Type 2 diabetes with A1c=7.3% on 06/09/17.  So she was started on metformin 500mg  BID. A1c=6.7% on 10/12/17   Outside A1c= 5.5% on 01/31/18    MRI on 12/13/18 showed increased visceral fat and hepatic steatosis.     In 10/2018 she discontinued metformin and titrated trulicity up to 1.5mg  per week and is tolerating it without adverse effects.     A1c=6.6% on 05/17/19    Interval History:    Since her previous appt on 05/17/19, she reported having increased weakness, fatigue, and hair loss.     Labs on 10/09/19 showed Ca=9.0mg /dL, HQI=6.9G/EX, BMWU=13.2GM/WN, UUV=253GU/YQ, TSH=7.70uIU/mL, FT4=0.78ng/dL, IH4=74.2VZ/DG.      She was increased to levothyroxine daily on 10/16/19.     She is trying to avoid fats in her diet.  She intentionally lost 8lbs.  She walks 10-70mins 3-4x per week.     DXA scan on 10/09/19 showed lowest T-score of -2.3 at the L femoral neck, and -1.0, -0.1, and -0.4 at the lumbar spine, L total hip, and the L 1/3 radius, respectively.    She is taking calcium+D3 600mg /1600u BID.  She consumes 1-2 servings of calcium rich foods per day, mostly in the form of daily.     She continued Trulicity 1.5mg  weekly, and is tolerating it well.     Her A1c=6.5% on 10/09/19    PMH:  - Mast cell activation syndrome  - CVID  - osteopenia  - hypothyroidism d/t hashimotos's  -  Type 2 diabetes mellitus    Family Hx:  Paternal grandmother - CADw/MI s/p CABG  Maternal grandfather - CADw/MI  Maternal uncle - CADw/MI  Maternal aunt - mastoctytosis, osteoporosis  Mom- thyroid cancer, hypothyroid, mastocytosis, HLD, osteoporosis  Niece- thyroid cancer, hypothyroid  Nephew- thyroid cancer, hypothyroid  Brother -  Type 1 diabetes, HLD, CADw/MI s/p CABG PCI     Social Hx:  - lives in [place], [place]  - No smoking, EtOH or illicit drugs    Current Outpatient Medications   Medication Sig Dispense Refill   ???  blood-glucose meter Misc Use meter to test blood glucose twice daily 1 each 0   ??? busPIRone (BUSPAR) 10 MG tablet Take 2 tablets (20 mg total) by mouth 3 times a day. 180 tablet 2   ??? cetirizine (ZYRTEC) 10 mg Cap Take 10 mg by mouth daily. 30 capsule 2   ??? cromolyn (GASTROCROM) 100 mg/5 mL solution Take 5 mLs (100 mg total) by mouth 4 times daily before meals and at bedtime. 480 mL 11   ??? cyanocobalamin, vitamin B-12, (VITAMIN B-12 INJ) Inject 0.5 mLs as directed.     ??? diphenhydrAMINE (BENADRYL) 25 mg capsule Take 3-6 capsules (75-150 mg total) by mouth daily. 90 capsule 2   ??? doxepin (SINEQUAN) 10 MG capsule TAKE ONE (1) CAPSULE BY MOUTH AT BEDTIME. 30 capsule 0   ??? dulaglutide 1.5 mg/0.5 mL PnIj Inject 1.5 mg subcutaneously every 7 days. 6 mL 3   ??? famotidine (PEPCID) 40 MG tablet Take 40 mg by mouth 2 times a day.     ??? fexofenadine (ALLEGRA) 180 MG tablet Take 1 tablet (180 mg total) by mouth daily. 30 tablet 3   ??? fluticasone propionate (FLONASE) 50 mcg/actuation nasal spray Use 1 spray into each nostril daily. 16 g 1   ??? furosemide (LASIX) 20 MG tablet Take 1 tablet (20 mg total) by mouth daily. 90 tablet 3   ??? furosemide (LASIX) 20 MG tablet Take 1 tablet (20 mg total) by mouth daily. 30 tablet 0   ??? hydrOXYchloroQUINE (PLAQUENIL) 200 mg tablet Take by mouth daily with breakfast.     ??? hydrOXYzine HCL (ATARAX) 25 MG tablet Take 1 tablet (25 mg total) by mouth every 8 hours as needed for Anxiety. May take two tablets at bedtime. 120 tablet 2   ??? levalbuterol (XOPENEX) 1.25 mg/3 mL nebulizer solution Inhale 3 mL (1.25 mg total) by nebulization every 6 hours as needed for Wheezing. 72 mL 0   ??? levothyroxine (SYNTHROID) 175 MCG tablet Take 1 tablet (175 mcg total) by mouth daily. 90 tablet 3   ??? montelukast (SINGULAIR) 10 mg tablet TAKE ONE TABLET BY MOUTH AT BEDTIME. 30 tablet 0   ??? nebulizers (COMPACT COMPRESSOR NEBULIZER) Misc Please dispense 1 nebulizer with tubing and mouthpiece. 1 each 0   ??? omeprazole  (PRILOSEC) 40 MG capsule TAKE 1 CAPSULE BY MOUTH 2 TIMES DAILY BEFORE MEALS 60 capsule 5   ??? onabotulinumtoxinA (BOTOX COSMETIC) 50 unit SolR Inject into the muscle.     ??? triamcinolone (KENALOG) 0.1 % ointment APPLY 2 TIMES DAILY 80 g 11   ??? TRUE METRIX GLUCOSE TEST STRIP Strp USE TO TEST BLOOD SUGAR 2 TIMES DAILY 50 strip 11   ??? TRUEPLUS LANCETS 30 gauge Misc USE TWICE A DAY AS DIRECTED. 100 each 3   ??? VITAMIN D3-VITAMIN K2, MK4, ORAL Take by mouth.       No current facility-administered medications for this visit.  Objective:  Physical Exam:  Gen: caucasian female  in NAD, alert, cooperative   Psych: normal speech and mood, appropriate thought content    Remaining exam deferred due to televisit     Labs:  Component      Latest Ref Rng & Units 05/17/2019 05/22/2019 05/22/2019 10/09/2019   Creatinine      0.60 - 1.30 mg/dL 1.61   0.96   Glucose      70 - 100 mg/dL 045 (H)   409 (H)   Calcium      8.6 - 10.3 mg/dL 9.5   9.0   eGFR NONAA CKD-EPI      See note. 83   74   Phosphorus      2.1 - 4.7 mg/dL 3.7   3.5   Albumin      3.5 - 5.7 g/dL 4.3   4.2   Cholesterol, Total      0 - 200 mg/dL    811 (H)   Triglycerides, Serum      10 - 149 mg/dL    914 (H)   HDL      60 - 92 mg/dL    38 (L)   LDL Cholesterol      mg/dL    782   Salivary Cortisol      ug/dL  9.562 1.308    PTH      12.0 - 88.0 pg/mL 82.0   155.0 (H)   Hemoglobin A1C      4.0 - 5.6 %    6.5 (H)   TSH      0.45 - 4.12 uIU/mL 4.13 (H)   7.70 (H)   POC Glucose Monitoring Device      70 - 100 mg/dL 657 (H)      POCT - Hemoglobin A1C      4.0 - 5.6 % 6.6 (H)      Vit D, 25-Hydroxy      30.0 - 100.0 ng/mL 37.5   30.9   Free T4      0.61 - 1.76 ng/dL 8.46   9.62   T3, Total      60.0 - 220.0 ng/dL    95.2       Imaging:      Date LS Left FN Left TH Right FN Right TH 1/3 FA     10/09/19 BMD:0.933g/cm2  T-score: -1.0 SD   Z-score: 0.2 SD BMD:0.589g/cm2   T-score: -2.3 ??SD   Z-score: -1.1 SD BMD: 0.926g/cm2   T-score: -0.1 SD   Z-score: 0.7 SD BMD: 0.638 g/cm2    T-score: -1.9 SD   Z-score: -0.7 SD  BMD: 0.863g/cm2   T-score: -0.7 SD   Z-score: 0.2 SD  BMD: 0.668g/cm2   T-score: -0.4 SD   Z-score: 0.7 SD      10/18/17 BMD:0.970g/cm2  T-score: -0.7 SD   Z-score: 0.5 SD BMD:0.633 g/cm2  T-score: -1.9  SD  Z-score: -0.8 SD BMD:0.953 g/cm2   T-score: 0.1 SD   Z-score: 0.8 SD  BMD: 0.651 g/cm2  T-score: -1.8  SD  Z-score: -0.7 SD BMD: 0.877g/cm2   T-score: -0.5 SD   Z-score: 0.2 SD BMD: 0.650 g/cm2  T-score: -0.7  SD  Z-score: 0.3 SD     Assessment/Plan:  59 y.o. female with osteopenia, hypothyroidism, hyperparathyroidism, and Type 2 diabetes.    Emily Bonilla has bone density in the osteopenic range that is slightly decreased in the past 2 years.  While she has multiple risk factors  for low bone density including, sex, postmenopausal status, extensive corticosteroid use, secondary hyperparathyroidism, and family history, her estimated risk for fracture by FRAX remains below the threshold for treatment.  Therefore we will not start pharmacologic therapy for her bones at this time.  She is due for another DXA scan in 10/2021.       She has elevated PTH with normal serum calcium levels c/w secondary hyperparathyroidism likely due to inadequate calcium and vitamin D intake/absorption due to her GI issues.  Her vitamin D level is borderline, so we will increase her vitamin D supplement.       She also has hypothyroidism d/t hashimoto's disease.  Her TFTs were elevated on recent labs.  So she was increased to levothyroxine daily.  We will recheck TFTs in 4-6 weeks.    She has good glycemic control as indicated by her A1c=6.6%.  She is tolerating trulicity 1.5mg  weekly with out adverse effects.  So we will titrate up to 3.0mg  weekly to increase weight loss benefit.     Plan:  - increase dulaglutide to 3.0mg  weekly  - continue levothyroxine daily, for now      - check TSH, FT4 in 4-6 weeks  - Add vitamin D 5000units 3x weekly  - 1200mg  of calcium daily form diet and  supplements combined    Follow-up in 6 months.    The past history was reviewed and copied forward (with edits) from a my note written on 05/17/19. I have reviewed and updated the history, physical exam, data, assessment, and plan of the note so that it reflects my current evaluation and management of the patient.

## 2019-10-19 MED ORDER — dulaglutide 3 mg/0.5 mL PnIj
3 | SUBCUTANEOUS | 3 refills | Status: AC
Start: 2019-10-19 — End: 2020-04-03

## 2019-10-19 NOTE — Unmapped (Signed)
Eric from [facility] called requesting to speak with staff regarding Trulicity.     Minerva Areola states the pt informed him that her trulicity was increased from 1.5 mg weekly to 3 mg weekly at yesterday visit. Minerva Areola is requesting an updated script be sent to him.     Per plan in LOV notes:  Plan:  - continue dulaglutide 1.5mg  weekly  - continue levothyroxine daily, for now      - check TSH, FT4  - 1200mg  of calcium daily form diet and supplements combined  -  Check renal panel, PTH and vitamin D levels.  - DXA scan in 10/2019

## 2019-10-24 ENCOUNTER — Ambulatory Visit: Admit: 2019-10-24 | Payer: PRIVATE HEALTH INSURANCE | Attending: Gastroenterology

## 2019-10-24 DIAGNOSIS — K529 Noninfective gastroenteritis and colitis, unspecified: Secondary | ICD-10-CM

## 2019-10-24 NOTE — Unmapped (Signed)
GASTROENTEROLOGY AMBULATORY CONSULTATION NOTE    HISTORY OF PRESENT ILLNESS:    The patient is a 59 y.o. female with a history of systemic mastocytosis/mast cell activation syndrome, common variable immunodeficiency syndrome, history of cardiac arrest and anaphylaxis, GERD, status post cholecystectomy, focal hepatic adenoma, fatty liver, chronic diarrhea, has an appointment today for follow-up.  The patient overall is doing well.  She is on omeprazole 40 mg by mouth twice a day and Pepcid 40 mg by mouth twice a day.  She also reports that her diarrhea is better.  She is also using hyoscyamine 0.375 mg by mouth twice a day as needed for abdominal cramps and diarrhea.  She is still complaining from abdominal obesity.  CT scan of the abdomen without contrast did not show any GI abnormality but mostly she had abdominal fat.  She was treated empirically with budesonide for her chronic diarrhea but that helped her diarrhea but increased her bloating and abdominal distention.    She is being evaluated with genetics for inherited genetic diseases that can explain her mast cell disorder.  Denies any rectal bleeding or melena.  She is on Cromolyn 100 mg by mouth 4 times a day.    Follow up visit 10/24/19:  On Prilosec 40 BID and Famotidine 40 mg po BID.  Reflux is controlled.  Rarely has diarrhea but feels is a lot better.  Denies any abdominal pain, nausea or vomiting.    Prior GI workup:  06/2017 had negative stool culture, H.pylori and stool calprotectin.  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  ??  Procedure History:  EGD- over 20+ years ago per patient showing inflammation with negative eosinophils. No records available.  Colonoscopy- 8+ years ago per patient shows inflammation, was having colonoscopies every year for a while then every 5 years due to CVID and mast cell disorders. No records  available.   ??     Past Medical History:   Diagnosis Date   ??? Anaphylaxis    ??? Anemia    ??? Anxiety    ??? Asthma    ??? Atrial fibrillation (CMS Dx)    ??? Bronchitis    ??? Heart disease    ??? Neurological disease    ??? OSA (obstructive sleep apnea)     states resolved   ??? Osteoporosis    ??? Psychogenic nonepileptic seizure    ??? Seizures (CMS Dx)    ??? Spontaneous pneumothorax     states pneumomediatsium/pneumopericardium, self resolved   ??? Thyroid disease    ??? Ulcer    ??? WPW (Wolff-Parkinson-White syndrome)        Past Surgical History:   Procedure Laterality Date   ??? BONE MARROW BIOPSY      benign   ??? CARDIAC ELECTROPHYSIOLOGY MAPPING AND ABLATION      attempted x 2, states not successful either time   ??? CHOLECYSTECTOMY  2015   ??? DENTAL EXTRACTION Right 04/25/2017    Procedure: EXTRACTION TOOTH # 31, BRIDGE RESECTIONING;  Surgeon: Gladys Damme, DMD;  Location: [facility] OR;  Service: Oral Surgery;  Laterality: Right;   ??? FACIAL COSMETIC SURGERY     ??? HYSTERECTOMY  2009   ??? INSERTION CATHETER VASCULAR ACCESS N/A 09/06/2017    Procedure: INSERTION POWER PORT A CATH;  Surgeon: Penni Bombard, MD;  Location: [facility] OR;  Service: General;  Laterality: N/A;   ??? LIVER BIOPSY  benign hepatic adednoma per patient   ??? REMOVAL HARDWARE LEG Left 08/17/2016    Procedure: REMOVAL OF HARDWARE LEFT TIBIA;  Surgeon: Andee Lineman, MD;  Location: [facility] OR;  Service: Orthopedics;  Laterality: Left;   ??? TIBIA FRACTURE SURGERY     ??? TONSILLECTOMY      + adenoids       Family History:   Family History   Problem Relation Age of Onset   ??? Kidney failure Mother    ??? Aneurysm Mother        Social History     Socioeconomic History   ??? Marital status: Divorced     Spouse name: Not on file   ??? Number of children: Not on file   ??? Years of education: Not on file   ??? Highest education level: Not on file   Occupational History   ??? Not on file   Tobacco Use   ??? Smoking status: Never Smoker   ??? Smokeless tobacco: Never Used   Vaping Use   ??? Vaping Use: Never assessed    Substance and Sexual Activity   ??? Alcohol use: No     Comment: Denies (11/15/2016)   ??? Drug use: No     Comment: Denies (11/15/2016)   ??? Sexual activity: Not Currently   Other Topics Concern   ??? Caffeine Use Yes   ??? Occupational Exposure Not Asked   ??? Exercise No   ??? Seat Belt Yes   Social History Narrative   ??? Not on file     Social Determinants of Health     Financial Resource Strain:    ??? Difficulty of Paying Living Expenses:    Physical Activity:    ??? Days of Exercise per Week:    ??? Minutes of Exercise per Session:    Stress:    ??? Feeling of Stress :    Social Connections:    ??? Frequency of Communication with Friends and Family:    ??? Frequency of Social Gatherings with Friends and Family:    ??? Attends Religious Services:    ??? Database administrator or Organizations:    ??? Attends Engineer, structural:    ??? Marital Status:        Current Medications:      Current Outpatient Medications:   ???  blood-glucose meter Misc, Use meter to test blood glucose twice daily, Disp: 1 each, Rfl: 0  ???  busPIRone (BUSPAR) 10 MG tablet, Take 2 tablets (20 mg total) by mouth 3 times a day., Disp: 180 tablet, Rfl: 2  ???  cetirizine (ZYRTEC) 10 mg Cap, Take 10 mg by mouth daily., Disp: 30 capsule, Rfl: 2  ???  cholecalciferol, vitamin D3, 125 mcg (5,000 unit) capsule, Take 1 capsule (5,000 Units total) by mouth every Monday, Wednesday, and Friday., Disp: 40 capsule, Rfl: 3  ???  cromolyn (GASTROCROM) 100 mg/5 mL solution, Take 5 mLs (100 mg total) by mouth 4 times daily before meals and at bedtime., Disp: 480 mL, Rfl: 11  ???  cyanocobalamin, vitamin B-12, (VITAMIN B-12 INJ), Inject 0.5 mLs as directed., Disp: , Rfl:   ???  diphenhydrAMINE (BENADRYL) 25 mg capsule, Take 3-6 capsules (75-150 mg total) by mouth daily., Disp: 90 capsule, Rfl: 2  ???  doxepin (SINEQUAN) 10 MG capsule, TAKE ONE (1) CAPSULE BY MOUTH AT BEDTIME., Disp: 30 capsule, Rfl: 0  ???  dulaglutide 3 mg/0.5 mL PnIj, Inject 3 mg subcutaneously every 7 days., Disp: 6  mL,  Rfl: 3  ???  famotidine (PEPCID) 40 MG tablet, Take 40 mg by mouth 2 times a day., Disp: , Rfl:   ???  fexofenadine (ALLEGRA) 180 MG tablet, Take 1 tablet (180 mg total) by mouth daily., Disp: 30 tablet, Rfl: 3  ???  fluticasone propionate (FLONASE) 50 mcg/actuation nasal spray, Use 1 spray into each nostril daily., Disp: 16 g, Rfl: 1  ???  furosemide (LASIX) 20 MG tablet, Take 1 tablet (20 mg total) by mouth daily., Disp: 90 tablet, Rfl: 3  ???  furosemide (LASIX) 20 MG tablet, Take 1 tablet (20 mg total) by mouth daily., Disp: 30 tablet, Rfl: 0  ???  hydrOXYchloroQUINE (PLAQUENIL) 200 mg tablet, Take by mouth daily with breakfast., Disp: , Rfl:   ???  hydrOXYzine HCL (ATARAX) 25 MG tablet, Take 1 tablet (25 mg total) by mouth every 8 hours as needed for Anxiety. May take two tablets at bedtime., Disp: 120 tablet, Rfl: 2  ???  levalbuterol (XOPENEX) 1.25 mg/3 mL nebulizer solution, Inhale 3 mL (1.25 mg total) by nebulization every 6 hours as needed for Wheezing., Disp: 72 mL, Rfl: 0  ???  levothyroxine (SYNTHROID) 175 MCG tablet, Take 1 tablet (175 mcg total) by mouth daily., Disp: 90 tablet, Rfl: 3  ???  montelukast (SINGULAIR) 10 mg tablet, TAKE ONE TABLET BY MOUTH AT BEDTIME., Disp: 30 tablet, Rfl: 0  ???  nebulizers (COMPACT COMPRESSOR NEBULIZER) Misc, Please dispense 1 nebulizer with tubing and mouthpiece., Disp: 1 each, Rfl: 0  ???  omeprazole (PRILOSEC) 40 MG capsule, TAKE 1 CAPSULE BY MOUTH 2 TIMES DAILY BEFORE MEALS, Disp: 60 capsule, Rfl: 5  ???  onabotulinumtoxinA (BOTOX COSMETIC) 50 unit SolR, Inject into the muscle., Disp: , Rfl:   ???  triamcinolone (KENALOG) 0.1 % ointment, APPLY 2 TIMES DAILY, Disp: 80 g, Rfl: 11  ???  TRUE METRIX GLUCOSE TEST STRIP Strp, USE TO TEST BLOOD SUGAR 2 TIMES DAILY, Disp: 50 strip, Rfl: 11  ???  TRUEPLUS LANCETS 30 gauge Misc, USE TWICE A DAY AS DIRECTED., Disp: 100 each, Rfl: 3    Allergies:  Acetaminophen, Adhesive tape-silicones, Alcohol, Amitriptyline, Aspirin, Benzoic acid, Blue dye, Flecainide,  Gabapentin, Iodinated contrast media, Isopropyl alcohol, Latex, Levofloxacin, Lidocaine, Nsaids (non-steroidal anti-inflammatory drug), Onion, Other, Paraben, Perfume, Pregabalin, Preservative, Quetiapine, Red dye, Sulfa (sulfonamide antibiotics), Sulfur, Trazodone, Verapamil, Yellow dye, Green dye, Onion extract, Doxepin, Duloxetine, Epi e-z pen, Hydroxyzine, Statins-hmg-coa reductase inhibitors, and Verapamil (bulk)    Vitals:    Ht 5' 7 (1.702 m)    LMP  (LMP Unknown)    BMI 33.22 kg/m??     PHYSICAL EXAM:    Can not be performed over the phone.      DATA:     Lab Results   Component Value Date    WBC 9.8 10/19/2018    RBC 4.82 10/19/2018    HGB 13.3 10/19/2018    HCT 40.0 10/19/2018    PLT 386 10/19/2018    MCV 83.0 10/19/2018    MCH 27.6 10/19/2018    MCHC 33.3 10/19/2018    RDW 14.5 10/19/2018    NRBC 0 10/19/2018    LYMPHOPCT 27.0 10/19/2018    MONOPCT 9.7 10/19/2018    EOSPCT 5.4 10/19/2018    BASOPCT 0.6 10/19/2018    MONOSABS 951 (H) 10/19/2018    LYMPHSABS 2,646 10/19/2018    EOSABS 529 (H) 10/19/2018    BASOSABS 59 10/19/2018     Lab Results   Component Value Date  NA 147 (H) 10/09/2019    K 4.1 10/09/2019    CL 108 10/09/2019    CO2 30 10/09/2019    BUN 11 10/09/2019    CREATININE 0.86 10/09/2019    CALCIUM 9.0 10/09/2019    GLUCOSE 131 (H) 10/09/2019     Lab Results   Component Value Date    ALKPHOS 123 10/19/2018    ALT 32 10/19/2018    AST 16 10/28/2016    PROT 7.0 10/19/2018    BILITOT 0.3 10/19/2018     No results found for: PROTIME, INR, WARFARIN  No results found for: LABA1C  Lab Results   Component Value Date    TSH 7.70 (H) 10/09/2019     Lab Results   Component Value Date    VITAMINB12 589 05/17/2019     No results found for: FOLATE  Lab Results   Component Value Date    IRON 73 06/09/2017     Lab Results   Component Value Date    LABIRON 19.5 06/09/2017     Lab Results   Component Value Date    TIBC 375 06/09/2017     Lab Results   Component Value Date    FERRITIN 46.0 06/09/2017     No  results found for: ANATITER, ANAPATTRN1, ANA    IMPRESSION/RECOMMENDATIONS:    1.  Epigastric pain likely due to GERD/gastritis is controlled :  Increased acidity of the stomach and GERD is one of the GI manifestations of systemic mastocytosis.  Continue with omeprazole 40 mg by mouth twice a day and Pepcid 40 mg by mouth twice a day.  I would continue omeprazole and Pepcid at the current doses.  Antireflux lifestyle.    2.  Chronic diarrhea, controlled likely with improvement in systemic mastocytosis:  Off hyoscyamine due feeling sick.  Also likely related to systemic mastocytosis/mast cell activation syndrome.  Low FODMAP diet.    She is currently on cromolyn from 100 mg by mouth 3 times a day.    3.   Abdominal obesity:  MRI of the abdomen did not show any intra-abdominal mass/lesion but noted to have fatty liver and 1 cm liver cyst versus hemangioma.  She was counseled about weight loss and dietary changes.  Likely has metabolic syndrome.    RTC in 6 months.    The diagnoses and the plan of care was discussed with the patient.     Electronically signed by Glenna Fellows, MD on 10/24/2019 at 10:38 AM   This was a Phone conversation, in lieu of an in-person visit. The patient provided verbal consent to participate in the telehealth visit.   I spent 8 minutes speaking with the patient, conducting an interview, performing a limited exam, and educating the patient on my assessment and plan. I also spent 9 minutes, on the same day as the encounter, documenting clinical information in the electronic or other health record.  This note was completely edited, written and reviewed by me and consists of information cut and pasted from the my most recent visit, my smart phrases and other Epic tools. I have personally reviewed all aspects of this note to at least include reviewing this patient's chart and problem list, updating the history, physical exam, lab and procedure results, and assessment and plan as detailed above and  below.  As such this visit note reflects my current evaluation and management for this patient.

## 2019-11-01 ENCOUNTER — Ambulatory Visit
Admit: 2019-11-01 | Discharge: 2019-11-01 | Payer: PRIVATE HEALTH INSURANCE | Attending: Student in an Organized Health Care Education/Training Program

## 2019-11-01 DIAGNOSIS — F459 Somatoform disorder, unspecified: Secondary | ICD-10-CM

## 2019-11-01 MED ORDER — busPIRone (BUSPAR) 10 MG tablet
10 | ORAL_TABLET | Freq: Three times a day (TID) | ORAL | 2 refills | Status: AC
Start: 2019-11-01 — End: 2020-02-07

## 2019-11-01 MED ORDER — clonazePAM (KLONOPIN) 0.5 MG tablet
0.5 | ORAL_TABLET | Freq: Two times a day (BID) | ORAL | 1 refills | Status: AC | PRN
Start: 2019-11-01 — End: 2019-12-01

## 2019-11-01 NOTE — Unmapped (Signed)
[facility]  [facility]  Progress Note     Emily Bonilla  03-27-1960  11/01/2019    This was a Phone conversation, in lieu of an in-person visit. The patient provided verbal consent to participate in the telehealth visit. I spent 33 minutes speaking with the patient, conducting an interview, performing a limited exam, and educating the patient on my assessment and plan. I also spent 6 minutes, on the same day as the encounter, obtaining and/or reviewing separately obtained history, ordering medications, tests, or procedures, referring and communicating with other health care professionals  and documenting clinical information in the electronic or other health record.    Chief Complaint:     Chief Complaint   Patient presents with   ??? Follow-up     Interval History:     Emily Bonilla is a 59 y.o. White or Caucasian female with a history of unspecified anxiety disorder, atrial fibrillation, WPW, idiopathic mast cell activation, anemia, asthma, hypothyroidism, GERD, intermittent chronic HA, PNES who presents to [facility] on 11/01/2019 for follow up. Last seen by me on 08/30/2019. Current medications include: buspirone 20 mg TID, hydroxyzine 25 mg TID, 50 mg QHS; clonazepam 0.5mg  TID (decreased 90 --> 75 tabs)    Emily Bonilla was asked to summarize events since last visit. States that she went to BID dosing for clonazepam due to nursing/staffing issues. Denies seizures or spells during this time. Also self decreased her hydroxyzine TID - only takes hydroxyzine PRN at night. Continues to take her buspirone as normal.     Reports significant family stress - 77 yo aunt recently passed away from COVID, mother is also doing poorly in the hospital as her cancer as metastsized. Has some anxiety due to this but believes her stress is not overwhelming or significantly impairing. Is not having panic attacks and does clarify for this clinician as she does not have any physical  symptoms. She describes her anxiety as being more significantly overwhelmed and disabling. Rates her current anxiety as 4/10, is participating in enjoyable activities and making plans for the future.     Nonetheless, pt is managing well. Enjoys living at her assisted living facility [facility]) and looks forward to cooking her own meals. Believes that this has decreased the number and severity of her allergic attacks. Is maintained on hydroxychlorquine for her mast cell activation dz - continues to see her allergist.     Psychiatric Review of Symptoms:     The patient denies neurovegetative symptoms of depression including: -anhedonia, -decreased energy, -decreased sleep, -guilt/low self-esteem, -decreased concentration, -appetitte changes, -sleep changes, -PMR/PMA, -SI.    The patient denies manic symptoms including: -elevated mood, -increased energy, -decreased need for sleep, -rapid speech, -racing thoughts, -impulsivity, -grandiosity, -severe irritability.    The patient denies psychotic symptoms including: -evidence of delusional thinking, -hallucinations.    Changes to Psychiatric/Social/Substance History:     No updates made    Past Medical and Past Surgical History     Past Medical History:   Diagnosis Date   ??? Anaphylaxis    ??? Anemia    ??? Anxiety    ??? Asthma    ??? Atrial fibrillation (CMS Dx)    ??? Bronchitis    ??? Heart disease    ??? Neurological disease    ??? OSA (obstructive sleep apnea)     states resolved   ??? Osteoporosis    ??? Psychogenic nonepileptic seizure    ??? Seizures (CMS Dx)    ???  Spontaneous pneumothorax     states pneumomediatsium/pneumopericardium, self resolved   ??? Thyroid disease    ??? Ulcer    ??? WPW (Wolff-Parkinson-White syndrome)        Past Surgical History:   Procedure Laterality Date   ??? BONE MARROW BIOPSY      benign   ??? CARDIAC ELECTROPHYSIOLOGY MAPPING AND ABLATION      attempted x 2, states not successful either time   ??? CHOLECYSTECTOMY  2015   ??? DENTAL EXTRACTION Right 04/25/2017     Procedure: EXTRACTION TOOTH # 31, BRIDGE RESECTIONING;  Surgeon: Gladys Damme, DMD;  Location: [facility] OR;  Service: Oral Surgery;  Laterality: Right;   ??? FACIAL COSMETIC SURGERY     ??? HYSTERECTOMY  2009   ??? INSERTION CATHETER VASCULAR ACCESS N/A 09/06/2017    Procedure: INSERTION POWER PORT A CATH;  Surgeon: Penni Bombard, MD;  Location: [facility] OR;  Service: General;  Laterality: N/A;   ??? LIVER BIOPSY      benign hepatic adednoma per patient   ??? REMOVAL HARDWARE LEG Left 08/17/2016    Procedure: REMOVAL OF HARDWARE LEFT TIBIA;  Surgeon: Andee Lineman, MD;  Location: [facility] OR;  Service: Orthopedics;  Laterality: Left;   ??? TIBIA FRACTURE SURGERY     ??? TONSILLECTOMY      + adenoids       Allergies     Allergies   Allergen Reactions   ??? Acetaminophen Anaphylaxis     Unknown   ??? Adhesive Tape-Silicones Itching, Rash, Swelling and Anaphylaxis     Unknown   ??? Alcohol Anaphylaxis   ??? Amitriptyline Anaphylaxis   ??? Aspirin Anaphylaxis     Unknown   ??? Benzoic Acid Anaphylaxis   ??? Blue Dye Anaphylaxis     Unknown   ??? Flecainide Shortness Of Breath and Anaphylaxis     Itching, decreased libido, wheezing, flushing, numbness.    ??? Gabapentin Anaphylaxis   ??? Iodinated Contrast Media Anaphylaxis   ??? Isopropyl Alcohol Anaphylaxis   ??? Latex Anaphylaxis and Rash   ??? Levofloxacin Itching and Anaphylaxis   ??? Lidocaine Anaphylaxis   ??? Nsaids (Non-Steroidal Anti-Inflammatory Drug) Anaphylaxis   ??? Onion Anaphylaxis   ??? 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   ??? Paraben Anaphylaxis   ??? Perfume Anaphylaxis   ??? Pregabalin Anaphylaxis   ??? Preservative Anaphylaxis   ??? Quetiapine Anaphylaxis     Mental Status Change  Other reaction(s): Angioedema   ??? Red Dye  Anaphylaxis     Unknown   ??? Sulfa (Sulfonamide Antibiotics) Anaphylaxis   ??? Sulfur Anaphylaxis     Pt states: Any alcohol product   ??? Trazodone Rash and Anaphylaxis   ??? Verapamil Anaphylaxis   ??? Yellow Dye Anaphylaxis     Unknown   ??? Green Dye Rash     Other reaction(s): Contact Dermatitis   ??? Onion Extract Other (See Comments)   ??? Doxepin Other (See Comments)     Sedation and sleep paralysis (side effect)   ??? Duloxetine      Other reaction(s): Other (See Comments)  Mental Status Change   ??? Epi E-Z Pen    ??? Hydroxyzine Itching     Unknown   ??? Statins-Hmg-Coa  Reductase Inhibitors    ??? Verapamil (Bulk) Hives     Insomnia, flushing, scattered brain, migraine, itching, anxiety.      Home Medications:     Current Outpatient Medications   Medication Sig   ??? blood-glucose meter Use meter to test blood glucose twice daily   ??? busPIRone Take 2 tablets (20 mg total) by mouth 3 times a day.   ??? ZyrTEC Take 10 mg by mouth daily.   ??? cholecalciferol (vitamin D3) Take 1 capsule (5,000 Units total) by mouth every Monday, Wednesday, and Friday.   ??? cromolyn Take 5 mLs (100 mg total) by mouth 4 times daily before meals and at bedtime.   ??? cyanocobalamin, vitamin B-12, (VITAMIN B-12 INJ) Inject 0.5 mLs as directed.   ??? diphenhydrAMINE Take 3-6 capsules (75-150 mg total) by mouth daily.   ??? doxepin TAKE ONE (1) CAPSULE BY MOUTH AT BEDTIME.   ??? dulaglutide Inject 3 mg subcutaneously every 7 days.   ??? famotidine Take 40 mg by mouth 2 times a day.   ??? fexofenadine Take 1 tablet (180 mg total) by mouth daily.   ??? fluticasone propionate Use 1 spray into each nostril daily.   ??? furosemide Take 1 tablet (20 mg total) by mouth daily.   ??? furosemide Take 1 tablet (20 mg total) by mouth daily.   ??? hydrOXYchloroQUINE Take by mouth daily with breakfast.   ??? hydrOXYzine HCL Take 1 tablet (25 mg total) by mouth every 8 hours as needed for Anxiety. May take two tablets at bedtime.   ??? levalbuterol Inhale 3 mL (1.25 mg total) by nebulization every  6 hours as needed for Wheezing.   ??? levothyroxine Take 1 tablet (175 mcg total) by mouth daily.   ??? montelukast TAKE ONE TABLET BY MOUTH AT BEDTIME.   ??? Compact Compressor Nebulizer Please dispense 1 nebulizer with tubing and mouthpiece.   ??? omeprazole TAKE 1 CAPSULE BY MOUTH 2 TIMES DAILY BEFORE MEALS   ??? onabotulinumtoxinA (cosmetic) Inject into the muscle.   ??? triamcinolone APPLY 2 TIMES DAILY   ??? True Metrix Glucose Test Strip USE TO TEST BLOOD SUGAR 2 TIMES DAILY   ??? TRUEplus Lancets USE TWICE A DAY AS DIRECTED.     No current facility-administered medications for this visit.       ROS:     Denies headache, nausea, vomiting, blurry vision, chest pain, SOB, abdominal pain, diarrhea, constipation, dysuria, dizziness, new rash    Vitals:     There were no vitals filed for this visit.    Wt Readings from Last 3 Encounters:   08/03/19 212 lb 1.6 oz (96.2 kg)   07/02/19 215 lb 14.4 oz (97.9 kg)   06/28/19 214 lb (97.1 kg)     There is no height or weight on file to calculate BMI.    Physical Exam     Deferred due to telephone visit     Mental Status Exam:     Appearance: UTA due to telephone visit  Movement: UTA due to telephone visit    Speech: fluent, with normal tone, volume, and rate  Language: appropriate to interview  Mood/Affect: euthymic/ full range, mood congruent, content appropriate  Thought Process: goal directed, generally linear, no FOI  Associations: no LOA  Thought Content: Denies SI/HI, contracting for safety and making appropriate future plans; no delusions expressed  Perceptions: no evidence of psychotic thoughts; no AH/VH and not RTIS; denies neurovegetative symptoms  Orientation: alert, oriented to person place  and situation  Fund Of Knowledge: grossly normal  Attention and Concentration: normal  Recent and Remote Memory: grossly intact    Insight/Judgment: fairly good/generally good, by recent history    Labs and Imaging:     No results found for this or any previous visit (from the past 336  hour(s)).    Risk Assessment:     Risk Assessment:   Risk factors for violence to self/others: unemployed, co-morbid anxiety, chronic medical illness, chronic pain  ??  Protective factors include: female, no previous suicide attempts, life satisfaction, positive social support, denies hopelessness, no history of trauma, no psychosis, no cognitive dysfunction, no substance use, employed, denies SI/HI, future orientation, goal orientation, contracting for safety, sobriety, collateral information is reassuring, close outpatient follow-up, compliant with recommended medications, no access to guns or weapons  ??  Overall the risk: low for future dangerousness; however the patient's imminent risk is low as the patient now: denies SI/HI, intent or plan, is clinically sober, future oriented, contracting for safety, has outpatient psychiatric follow-up established, has improved social support. As such, the patient does not meet criteria for admission to inpatient psychiatry. They remain appropriate for an outpatient level of care.    Assessment and Plan:     59 y.o.??Caucasian female, history of unspecified anxiety disorder, atrial fibrillation, WPW, idiopathic mast cell activation, anemia, asthma, hypothyroidism, GERD, intermittent chronic HA, PNES, presents for follow up. Has self decreased clonazapam to BID PRN and reports well controlled anxiety, no adverse effects from this despite several recent acute stressors. Has also self decreased hydroxyzine. Will continue other medications as prescribed. Remains appropriate for outpatient care.   ??  Diagnoses:  -Somatic symptom disorder  -Unspecified anxiety disorder, moderate  -Cluster B personality traits  ??  # Unspecified anxiety, in partial remission              - Continue Buspirone 20 mg TID for anxiety (90 tabs, 2 refills sent)              - Continue Hydroxyzine 25 mg TID, 50mg  QHS for anxiety (no refills sent)              - Decrease clonazepam 0.5mg  BID PRN (decreased 75  tabs --> 65 tabs, 1 refill sent to pharmacy)               - Continue treatment with integrative medicine for biofeedback, massage therapy and acupuncture              - Self-management: meditation, yoga              - Discussed all prescribed medications including risks/benefits/side effects/alternatives. Patient expressed understanding and agreement with the current treatment plan  - OARRS report run on 10/14//2021. Appropriate and consistent with previous prescriptions.   ??  # Safety:   - The patient???s imminent risk is low, see risk assessment above. At this time, does not meet criteria for involuntary psychiatric hospitalization and is appropriate for outpatient level of care.              - Reviewed emergency contact protocol during and after clinic hours              - Patient expressed understanding of emergency resources including PES, 911, and the suicide hotline  ??  # Substance use: No active issues                # Medical: Continue regular follow up with PCP, sub specialists  Return to clinic in 3 months  Patient will be discussed in ongoing supervision with Janeth Rase, MD.    Ronn Melena, MD  PGY-3 Psychiatry  [facility]

## 2019-11-09 ENCOUNTER — Ambulatory Visit: Admit: 2019-11-09 | Payer: PRIVATE HEALTH INSURANCE | Attending: Family

## 2019-11-09 ENCOUNTER — Other Ambulatory Visit: Admit: 2019-11-09 | Payer: PRIVATE HEALTH INSURANCE

## 2019-11-09 DIAGNOSIS — G43719 Chronic migraine without aura, intractable, without status migrainosus: Secondary | ICD-10-CM

## 2019-11-09 DIAGNOSIS — E213 Hyperparathyroidism, unspecified: Secondary | ICD-10-CM

## 2019-11-09 LAB — PTH, INTACT: PTH: 119 pg/mL (ref 12.0–88.0)

## 2019-11-09 LAB — RENAL FUNCTION PANEL W/EGFR
Albumin: 4.3 g/dL (ref 3.5–5.7)
Anion Gap: 7 mmol/L (ref 3–16)
BUN: 10 mg/dL (ref 7–25)
CO2: 31 mmol/L (ref 21–33)
Calcium: 9.1 mg/dL (ref 8.6–10.3)
Chloride: 106 mmol/L (ref 98–110)
Creatinine: 0.84 mg/dL (ref 0.60–1.30)
Glucose: 103 mg/dL (ref 70–100)
Osmolality, Calculated: 297 mOsm/kg (ref 278–305)
Phosphorus: 4.1 mg/dL (ref 2.1–4.7)
Potassium: 4.8 mmol/L (ref 3.5–5.3)
Sodium: 144 mmol/L (ref 133–146)
eGFR AA CKD-EPI: 89 See note.
eGFR NONAA CKD-EPI: 77 See note.

## 2019-11-09 LAB — TSH: TSH: 2.19 u[IU]/mL (ref 0.45–4.12)

## 2019-11-09 LAB — T4, FREE: Free T4: 1.02 ng/dL (ref 0.61–1.76)

## 2019-11-09 LAB — T3: T3, Total: 99.1 ng/dL (ref 60.0–220.0)

## 2019-11-09 LAB — VITAMIN D 25 HYDROXY: Vit D, 25-Hydroxy: 33 ng/mL (ref 30.0–100.0)

## 2019-11-09 MED ORDER — botulinum toxin A (BOTOX) 100 unit injection
100 | INTRAMUSCULAR | Status: AC
Start: 2019-11-09 — End: 2019-11-09

## 2019-11-09 MED ORDER — botulinum toxin A (BOTOX) injection 200 Units
100 | Freq: Once | INTRAMUSCULAR | Status: AC
Start: 2019-11-09 — End: 2019-11-09
  Administered 2019-11-09: 15:00:00 200 [IU] via INTRAMUSCULAR

## 2019-11-09 MED ORDER — sodium chloride 0.9%
INTRAMUSCULAR | Status: AC
Start: 2019-11-09 — End: 2019-11-09

## 2019-11-09 MED FILL — SODIUM CHLORIDE 0.9 % INJECTION SOLUTION: INTRAMUSCULAR | Qty: 10

## 2019-11-09 MED FILL — BOTOX 100 UNIT INJECTION: 100 100 unit | INTRAMUSCULAR | Qty: 2

## 2019-11-09 NOTE — Unmapped (Signed)
MRN:  [id number]  Date: 11/09/2019   Shalamar Plourde is a 59 y.o. female who comes in for scheduled Botox injection.      BOTOX PREEMPT PROTOCOL     Total headache days per month: 8-12/mo.  Total days headache free per month: 18  Severity of headaches: moderate  Botox effective: Yes - Headaches are just coming back in the last 1-1/2 weeks.  She was having 30/mo headaches before Botox.  This is a reduction of 18-22/mo!    The risks, benefits and anticipated outcomes of the procedure, the risks and benefits of the alternatives to the procedure, and the roles and tasks of the personnel to be involved, were discussed with the patient.     The side effects of onabotulinum toxin used for the treatment of chronic migraine have been described to the patient in detail.  These include: ptosis, neck pain, neck weakness, anaphylaxis, cellulitis, systemic spread (death, respiratory failure, dysarthria, dysphagia, diplopia), blood born illnesses (HIV, hepatitis, mad cow disease).   The patient is aware that any side effects related to muscle paralysis could last for 3 months.     The patient has given written informed consent to the procedure and agrees to proceed.Yes     Treatment # 10   Dilution: 5 units/0.1 ml (200 U of botox was mixed with 4 ccs of bacteriostatic saline.)  Indication: Chronic Migraine        Injection Sites:  0.1 ccs of this solution was injected into the following:  Muscle    Fixed Site/Fixed Dose  L      R   Corrugator??????????????????????????????????????10 Units divided in 2 sites??????????????????????????????????????????  ??  Procerus ??????????????????????????????????????????5 Units in 1 site ????????????????????????????????????????????????????????????????????????  ??  Frontalis????????????????????????????????????????????20 Units divided in 4 sites????????????????????????????????????????????  ??  Temporalis ??????????????????????????????????40 Units divided in 8 sites ????????????????????????????????????????  ??  Occipitalis ??????????????????????????????????????30 Units divided in 6 sites ????????????????????????????????????????  ??  Cervical PSPs??????????????????????????20 Units divided in 4  sites??????????????????????????????????????????  ??  Trapezius????????????????????????????????????????30 Units divided in 6 sites???????????????????????????????????? 30??units extra in 6 bilateral divided doses  ???????????????????????????????????????????????????????????????????????????????????????????????????????????????????????????????????????????????????????????????????????????????????????????? ???????????????????????????????????????????????????????????????????????????????????????????????????????????????????????????????????????????????????????????????????????????????????? ??????  ??  ??  Total Units used:185  Total Units wasted:15??  Patient did tolerate procedure.     Change in Treatment Plan?no    RTC: 3 Months, For Botox and PRN    Stefan Church, CNP

## 2019-11-23 ENCOUNTER — Inpatient Hospital Stay
Admit: 2019-11-23 | Discharge: 2019-11-24 | Disposition: A | Discharge status: Home / Self Care | Payer: PRIVATE HEALTH INSURANCE

## 2019-11-23 DIAGNOSIS — R1084 Generalized abdominal pain: Secondary | ICD-10-CM

## 2019-11-23 LAB — BASIC METABOLIC PANEL
Anion Gap: 13 mmol/L (ref 3–16)
BUN: 11 mg/dL (ref 7–25)
CO2: 27 mmol/L (ref 21–33)
Calcium: 9.2 mg/dL (ref 8.6–10.3)
Chloride: 101 mmol/L (ref 98–110)
Creatinine: 0.99 mg/dL (ref 0.60–1.30)
Glucose: 118 mg/dL — ABNORMAL HIGH (ref 70–100)
Osmolality, Calculated: 292 mosm/kg (ref 278–305)
Potassium: 4.9 mmol/L (ref 3.5–5.3)
Sodium: 141 mmol/L (ref 133–146)
eGFR AA CKD-EPI: 73 See note.
eGFR NONAA CKD-EPI: 63 See note.

## 2019-11-23 LAB — HEPATIC FUNCTION PANEL
ALT: 28 U/L (ref 7–52)
AST: 31 U/L (ref 13–39)
Albumin: 4.4 g/dL (ref 3.5–5.7)
Alkaline Phosphatase: 127 U/L — ABNORMAL HIGH (ref 36–125)
Bilirubin, Direct: 0 mg/dL (ref 0.0–0.4)
Bilirubin, Indirect: 0.4 mg/dL (ref 0.0–1.1)
Total Bilirubin: 0.4 mg/dL (ref 0.0–1.5)
Total Protein: 7.7 g/dL (ref 6.4–8.9)

## 2019-11-23 LAB — DIFFERENTIAL
Basophils Absolute: 29 /uL (ref 0–200)
Basophils Relative: 0.2 % (ref 0.0–1.0)
Eosinophils Absolute: 1256 /uL — ABNORMAL HIGH (ref 15–500)
Eosinophils Relative: 8.6 % — ABNORMAL HIGH (ref 0.0–8.0)
Lymphocytes Absolute: 2351 /uL (ref 850–3900)
Lymphocytes Relative: 16.1 % (ref 15.0–45.0)
Monocytes Absolute: 1416 /uL — ABNORMAL HIGH (ref 200–950)
Monocytes Relative: 9.7 % (ref 0.0–12.0)
Neutrophils Absolute: 9548 /uL — ABNORMAL HIGH (ref 1500–7800)
Neutrophils Relative: 65.4 % (ref 40.0–80.0)
nRBC: 0 /100{WBCs} (ref 0–0)

## 2019-11-23 LAB — LIPASE: Lipase: 46 U/L (ref 4–82)

## 2019-11-23 LAB — CBC
Hematocrit: 43.8 % (ref 35.0–45.0)
Hemoglobin: 13.9 g/dL (ref 11.7–15.5)
MCH: 26.5 pg — ABNORMAL LOW (ref 27.0–33.0)
MCHC: 31.8 g/dL — ABNORMAL LOW (ref 32.0–36.0)
MCV: 83.4 fL (ref 80.0–100.0)
MPV: 8.4 fL (ref 7.5–11.5)
Platelets: 378 10E3/uL (ref 140–400)
RBC: 5.26 10E6/uL — ABNORMAL HIGH (ref 3.80–5.10)
RDW: 15.9 % — ABNORMAL HIGH (ref 11.0–15.0)
WBC: 14.6 10E3/uL — ABNORMAL HIGH (ref 3.8–10.8)

## 2019-11-23 MED ORDER — famotidine (PF) (PEPCID) injection 20 mg
20 | Freq: Once | INTRAVENOUS | Status: AC
Start: 2019-11-23 — End: 2019-11-23
  Administered 2019-11-23: 22:00:00 20 mg via INTRAVENOUS

## 2019-11-23 MED ORDER — methylPREDNISolone sod suc(PF) (SOLU-medrol) SolR 125 mg
125 | Freq: Once | INTRAMUSCULAR | Status: AC
Start: 2019-11-23 — End: 2019-11-23
  Administered 2019-11-23: 22:00:00 125 mg via INTRAVENOUS

## 2019-11-23 MED ORDER — diphenhydrAMINE (BENADRYL) injection 50 mg
50 | Freq: Once | INTRAMUSCULAR | Status: AC
Start: 2019-11-23 — End: 2019-11-23
  Administered 2019-11-23: 22:00:00 50 mg via INTRAVENOUS

## 2019-11-23 MED ORDER — famotidine (PF) (PEPCID) injection 40 mg
20 | Freq: Two times a day (BID) | INTRAVENOUS | Status: AC
Start: 2019-11-23 — End: 2019-11-23

## 2019-11-23 MED ORDER — methylPREDNISolone (MEDROL DOSEPACK) 4 mg tablet
4 | ORAL | 0 refills | Status: AC
Start: 2019-11-23 — End: 2020-05-07

## 2019-11-23 MED ORDER — lactated Ringers 1,000 mL IV fluid
Freq: Once | INTRAVENOUS | Status: AC
Start: 2019-11-23 — End: 2019-11-23
  Administered 2019-11-23: 21:00:00 via INTRAVENOUS

## 2019-11-23 MED FILL — FAMOTIDINE (PF) 20 MG/2 ML INTRAVENOUS SOLUTION: 20 20 mg/2 mL | INTRAVENOUS | Qty: 2

## 2019-11-23 MED FILL — DIPHENHYDRAMINE 50 MG/ML INJECTION SOLUTION: 50 50 mg/mL | INTRAMUSCULAR | Qty: 1

## 2019-11-23 MED FILL — SOLU-MEDROL (PF) 125 MG/2 ML SOLUTION FOR INJECTION: 125 125 mg/2 mL | INTRAMUSCULAR | Qty: 2

## 2019-11-23 NOTE — Unmapped (Signed)
Pt awaiting disposition, no new c/o at present, , pt given crackers for comfort,

## 2019-11-23 NOTE — Unmapped (Addendum)
Iv attempts x 2 unsuccessful, pt c/o this happens all the time, pt requesting her port be used,

## 2019-11-23 NOTE — Unmapped (Signed)
Pt requesting solumedrol 125 mg

## 2019-11-23 NOTE — Unmapped (Signed)
[facility] ED Note    Date of Service: 11/23/2019  Reason for Visit: Abdominal Pain      Patient History     HPI  Emily Bonilla is a 59 y.o. female with a past medical history significant for anaphylaxis with mast cell degranulation syndrome, atrial fibrillation presenting to the emergency department with inflammation.  Patient states that she has mast cell flares because she is allergic to many things.  On Tuesday she had to take her epinephrine that she has at home due to laying of shortness of breath and wheezing.  This improved her symptoms, however since then she has had GERD-like symptoms, abdominal bloating and pain, and overall and a feeling of inflammation and degranulation.  She denies any fevers or chills, does endorse recently having congestion and rhinorrhea, but denies any current chest pain, shortness of breath or cough.  She states when this happens usually fluids, steroids and antihistamines help with her symptoms.    Other than stated above, no additional aggravating or alleviating factors are noted.    Past Medical History:   Diagnosis Date   ??? Anaphylaxis    ??? Anemia    ??? Anxiety    ??? Asthma    ??? Atrial fibrillation (CMS Dx)    ??? Bronchitis    ??? Heart disease    ??? Neurological disease    ??? OSA (obstructive sleep apnea)     states resolved   ??? Osteoporosis    ??? Psychogenic nonepileptic seizure    ??? Seizures (CMS Dx)    ??? Spontaneous pneumothorax     states pneumomediatsium/pneumopericardium, self resolved   ??? Thyroid disease    ??? Ulcer    ??? WPW (Wolff-Parkinson-White syndrome)      Past Surgical History:   Procedure Laterality Date   ??? BONE MARROW BIOPSY      benign   ??? CARDIAC ELECTROPHYSIOLOGY MAPPING AND ABLATION      attempted x 2, states not successful either time   ??? CHOLECYSTECTOMY  2015   ??? DENTAL EXTRACTION Right 04/25/2017    Procedure: EXTRACTION TOOTH # 31, BRIDGE RESECTIONING;  Surgeon: Gladys Damme, DMD;  Location: [facility] OR;  Service: Oral Surgery;  Laterality: Right;   ??? FACIAL  COSMETIC SURGERY     ??? HYSTERECTOMY  2009   ??? INSERTION CATHETER VASCULAR ACCESS N/A 09/06/2017    Procedure: INSERTION POWER PORT A CATH;  Surgeon: Penni Bombard, MD;  Location: [facility] OR;  Service: General;  Laterality: N/A;   ??? LIVER BIOPSY      benign hepatic adednoma per patient   ??? REMOVAL HARDWARE LEG Left 08/17/2016    Procedure: REMOVAL OF HARDWARE LEFT TIBIA;  Surgeon: Andee Lineman, MD;  Location: [facility] OR;  Service: Orthopedics;  Laterality: Left;   ??? TIBIA FRACTURE SURGERY     ??? TONSILLECTOMY      + adenoids     Patient  reports that she has never smoked. She has never used smokeless tobacco. She reports that she does not drink alcohol and does not use drugs.  Previous Medications    BLOOD-GLUCOSE METER MISC    Use meter to test blood glucose twice daily    BUSPIRONE (BUSPAR) 10 MG TABLET    Take 2 tablets (20 mg total) by mouth 3 times a day.    CETIRIZINE (ZYRTEC) 10 MG CAP    Take 10 mg by mouth daily.    CHOLECALCIFEROL, VITAMIN D3, 125 MCG (5,000 UNIT) CAPSULE  Take 1 capsule (5,000 Units total) by mouth every Monday, Wednesday, and Friday.    CLONAZEPAM (KLONOPIN) 0.5 MG TABLET    Take 1 tablet (0.5 mg total) by mouth 2 times a day as needed for Anxiety for up to 30 days. Indications: Unspecified anxiety    CROMOLYN (GASTROCROM) 100 MG/5 ML SOLUTION    Take 5 mLs (100 mg total) by mouth 4 times daily before meals and at bedtime.    DIPHENHYDRAMINE (BENADRYL) 25 MG CAPSULE    Take 3-6 capsules (75-150 mg total) by mouth daily.    DOXEPIN (SINEQUAN) 10 MG CAPSULE    TAKE ONE (1) CAPSULE BY MOUTH AT BEDTIME.    DULAGLUTIDE 3 MG/0.5 ML PNIJ    Inject 3 mg subcutaneously every 7 days.    FAMOTIDINE (PEPCID) 40 MG TABLET    Take 40 mg by mouth 2 times a day.    FEXOFENADINE (ALLEGRA) 180 MG TABLET    Take 1 tablet (180 mg total) by mouth daily.    FLUTICASONE PROPIONATE (FLONASE) 50 MCG/ACTUATION NASAL SPRAY    Use 1 spray into each nostril daily.    FUROSEMIDE (LASIX) 20 MG TABLET    Take 1 tablet (20 mg  total) by mouth daily.    HYDROXYCHLOROQUINE (PLAQUENIL) 200 MG TABLET    Take by mouth daily with breakfast.    HYDROXYZINE HCL (ATARAX) 25 MG TABLET    Take 1 tablet (25 mg total) by mouth every 8 hours as needed for Anxiety. May take two tablets at bedtime.    LEVALBUTEROL (XOPENEX) 1.25 MG/3 ML NEBULIZER SOLUTION    Inhale 3 mL (1.25 mg total) by nebulization every 6 hours as needed for Wheezing.    LEVOTHYROXINE (SYNTHROID) 175 MCG TABLET    Take 1 tablet (175 mcg total) by mouth daily.    MONTELUKAST (SINGULAIR) 10 MG TABLET    TAKE ONE TABLET BY MOUTH AT BEDTIME.    NEBULIZERS (COMPACT COMPRESSOR NEBULIZER) MISC    Please dispense 1 nebulizer with tubing and mouthpiece.    OMEPRAZOLE (PRILOSEC) 40 MG CAPSULE    TAKE 1 CAPSULE BY MOUTH 2 TIMES DAILY BEFORE MEALS    ONABOTULINUMTOXINA (BOTOX COSMETIC) 50 UNIT SOLR    Inject into the muscle.    TRIAMCINOLONE (KENALOG) 0.1 % OINTMENT    APPLY 2 TIMES DAILY    TRUE METRIX GLUCOSE TEST STRIP STRP    USE TO TEST BLOOD SUGAR 2 TIMES DAILY    TRUEPLUS LANCETS 30 GAUGE MISC    USE TWICE A DAY AS DIRECTED.       Allergies:   Allergies as of 11/23/2019 - Fully Reviewed 11/23/2019   Allergen Reaction Noted   ??? Acetaminophen Anaphylaxis 11/17/2015   ??? Adhesive tape-silicones Itching, Rash, Swelling, and Anaphylaxis 09/27/2015   ??? Alcohol Anaphylaxis 10/10/2017   ??? Amitriptyline Anaphylaxis 01/04/2017   ??? Aspirin Anaphylaxis 09/27/2015   ??? Benzoic acid Anaphylaxis 07/20/2017   ??? Blue dye Anaphylaxis 11/17/2015   ??? Flecainide Shortness Of Breath and Anaphylaxis 02/06/2016   ??? Gabapentin Anaphylaxis 11/17/2015   ??? Iodinated contrast media Anaphylaxis 11/07/2015   ??? Isopropyl alcohol Anaphylaxis 11/07/2015   ??? Latex Anaphylaxis and Rash 09/27/2015   ??? Levofloxacin Itching and Anaphylaxis 11/07/2015   ??? Lidocaine Anaphylaxis 09/27/2015   ??? Nsaids (non-steroidal anti-inflammatory drug) Anaphylaxis 11/07/2015   ??? Onion Anaphylaxis 11/17/2015   ??? Other Anaphylaxis 09/27/2015   ???  Paraben Anaphylaxis 07/20/2017   ??? Perfume Anaphylaxis 11/07/2015   ??? Pregabalin  Anaphylaxis 09/27/2015   ??? Preservative Anaphylaxis 10/10/2017   ??? Quetiapine Anaphylaxis 11/07/2015   ??? Red dye Anaphylaxis 11/07/2015   ??? Sulfa (sulfonamide antibiotics) Anaphylaxis 07/20/2017   ??? Sulfur Anaphylaxis 11/25/2015   ??? Trazodone Rash and Anaphylaxis 11/07/2015   ??? Verapamil Anaphylaxis 07/20/2017   ??? Yellow dye Anaphylaxis 11/07/2015   ??? Green dye Rash 07/20/2017   ??? Onion extract Other (See Comments) 07/20/2017   ??? Doxepin Other (See Comments) 11/17/2015   ??? Duloxetine  11/17/2015   ??? Epi e-z pen  01/19/1996   ??? Hydroxyzine Itching 11/07/2015   ??? Statins-hmg-coa reductase inhibitors  10/10/2017   ??? Verapamil (bulk) Hives 02/06/2016       All nursing notes and triage notes were appropriately reviewed in the course of the creation of this note.     Review of Systems     Positive for nausea, heartburn, abdominal pain, abdominal bloating  Negative for fevers, chills, lightheadedness, chest pain, shortness of breath, wheezing, diarrhea, weakness, numbness, tingling    A complete review of systems was performed and is otherwise negative.    Physical Exam     Vitals:    11/23/19 1441 11/23/19 1804 11/23/19 1834   BP: (!) 133/114 (!) 144/126 124/56   BP Location: Right arm Right arm Right arm   Patient Position: Sitting Sitting Sitting   Pulse: 100 96 87   Resp: 18 18 18    Temp: 98.8 ??F (37.1 ??C)     TempSrc: Oral     SpO2: 99% 99% 98%     General:  Well appearing. No acute distress  Eyes:  Pupils reactive. No discharge from eyes. EOMI. No conjunctival injection.    ENT:  No discharge from nose. OP clear. MMM  Neck:  Supple, trachea midline  Pulmonary:   Non-labored breathing. Breath sounds clear bilaterally  Cardiac:  Tachycardic, regular rhythm. No murmurs  Abdomen:  Soft.  Nominal distention and bloating without tenderness to palpation.  Musculoskeletal:  No long bone deformity.   Vascular:  Extremities warm and perfused.  Normal pulses in all 4 extremities  Skin:  Dry, no rashes  Extremities:  No peripheral edema  Neuro:  Alert. Moves all four extremities to command. No focal deficit. Gait narrow and stable.   Diagnostic Studies     Labs:  Labs Reviewed   BASIC METABOLIC PANEL - Abnormal; Notable for the following components:       Result Value    Glucose 118 (*)     All other components within normal limits   CBC - Abnormal; Notable for the following components:    WBC 14.6 (*)     RBC 5.26 (*)     MCH 26.5 (*)     MCHC 31.8 (*)     RDW 15.9 (*)     All other components within normal limits   DIFFERENTIAL - Abnormal; Notable for the following components:    Eosinophils Relative 8.6 (*)     Neutrophils Absolute 9,548 (*)     Monocytes Absolute 1,416 (*)     Eosinophils Absolute 1,256 (*)     All other components within normal limits   HEPATIC FUNCTION PANEL - Abnormal; Notable for the following components:    Alkaline Phosphatase 127 (*)     All other components within normal limits   LIPASE   URINALYSIS-MACROSCOPIC W/REFLEX TO MICROSCOPIC       Radiology:  No orders to display       EKG:  No EKG Performed  Emergency Department Procedures       ED Course and MDM     Kassey Laforest is a 59 y.o. female with a history and presentation as described above in HPI.  The patient was evaluated by myself and the ED Attending Physician, Dr. Sena Slate. All management and disposition plans were discussed and agreed upon.    Upon presentation, the patient was well appearing and had stable vitals.  On initial assessment, the patient had no symptoms or physical exam findings concerning for airway compromise.  The patient blood pressure was within normal limits, making anaphylaxis and unlikely presentation.  With the patient's symptoms of bloating, GERD and on and off rashes, she has initially given a liter bolus and laboratory assessment was obtained.  This showed a leukocytosis with multiple cell line predominance, making it likely that this  presentation is consistent with her mast cell degranulation.  All other laboratory assessment was within normal limits. Allergy fellow was consulted to assist with medication management for this patient.  They recommended giving the patient an additional dose of Pepcid here in the emergency department along with fluids and antihistamine.  We discussed that the patient was not in acute emergent condition, but rather something that needed to be continually managed by her outpatient allergist.  Additionally, the patient stated that methylprednisolone helped with her symptoms, so this was given to her in the ED.  We discussed increasing her dose of cetirizine and Allegra at home and to follow-up as soon as possible with her primary care allergist.  The patient verbalized understanding and was amenable to this plan.  She was given a methylprednisolone Dosepak to go home with.    Medications received during this ED visit:  Medications   lactated Ringers 1,000 mL IV fluid ( Intravenous Stopped 11/23/19 1921)   diphenhydrAMINE (BENADRYL) injection 50 mg (50 mg Intravenous Given 11/23/19 1737)   famotidine (PF) (PEPCID) injection 20 mg (20 mg Intravenous Given 11/23/19 1738)   methylPREDNISolone sod suc(PF) (SOLU-medrol) SolR 125 mg (125 mg Intravenous Given 11/23/19 1826)       At this time, the patient was deemed appropriate for discharge. My customary discharge instructions, including strict return precautions for new or worsening symptoms concerning to the patient, were provided. All of patient's questions were answered satisfactorily, and she was subsequently sent home in stable condition.    Impression     1. Generalized abdominal pain         Plan   Discharge:  The patient is to be discharged home in stable/improved condition. Workup, treatment and diagnosis were discussed with the patient and/or family members; the patient agrees to the plan and all questions were addressed and answered. The patient is instructed to  return to the emergency department should her symptoms worsen or any concern she believes warrants acute physician evaluation.        Rene Paci, MD, PGY-2  [facility]    Critical Care Time (Attendings)        Rene Paci, MD  Resident  11/24/19 [phone]

## 2019-11-23 NOTE — Unmapped (Signed)
Pt c/o abd pain since Tues, pt c/o feeling bloated, n/v/d since tues, and HA, joint pains, and reflux, pt given herself EPI on Tues, pt is at the [facility], has been staying in her room since COVID started, was in the elevator and smelt someone with cologne on and made her have an attack,

## 2019-11-23 NOTE — Unmapped (Signed)
MD at bedside.

## 2019-11-23 NOTE — Unmapped (Signed)
The patient was seen and examined.  The case was discussed with the resident physician who saw the patient primarily.  I agree with the plan.    My assessment reveals a nontoxic-appearing lady with no peritoneal abdominal findings.    This note was dictated using voice recognition software which occasionally leads to inadvertent typographical errors.

## 2019-11-23 NOTE — Unmapped (Signed)
Pt presents ED with c/o abdominal pain. H/o mast cell activation syndrome idiopathic anaphylaxis. Last epi given to self on Tuesday. Reports she feels herself degranulating. Abd guarding noted. Respirations easy, even, unlabored. Skin warm and dry.

## 2019-11-23 NOTE — Unmapped (Signed)
You're seen in the emergency department for continuation of your symptoms from your degranulation syndrome.  You need to double your doses of cetirizine and Allegra over the weekend according to our allergy specialist.  You should call Dr. Grover Canavan on Monday to schedule follow-up appointment for your symptoms.  Please return to the emergency department if you have new or worsening symptoms, develop wheezing or shortness of breath, or pass out.

## 2020-01-24 ENCOUNTER — Ambulatory Visit: Payer: PRIVATE HEALTH INSURANCE

## 2020-02-06 ENCOUNTER — Ambulatory Visit: Admit: 2020-02-06 | Discharge: 2020-02-06 | Payer: PRIVATE HEALTH INSURANCE

## 2020-02-06 DIAGNOSIS — J019 Acute sinusitis, unspecified: Secondary | ICD-10-CM

## 2020-02-06 NOTE — Unmapped (Signed)
It was a pleasure seeing you today virtually!    Please call [phone]) to schedule a screening mammogram.  Please call [phone] to schedule your Lung Cancer screening  Please call [phone]) to schedule a PAP smear  Please call [phone] to schedule a screening colonoscopy  Please call [phone] to schedule a DXA scan (screening for osteoporosis) at the [facility].  Make sure to take your paper order with you to your appointment.

## 2020-02-06 NOTE — Unmapped (Signed)
[facility]  Faculty Practice Acute Telemedicine Video Visit  Subjective:   Pre-visit planning done, including pre-visit huddle with nursing and MA staff prior to session.     Patient ID: Emily Bonilla is a 60 y.o. lady with a history of anxiety and depression, CVID (coomon variable immunodeficiency), mast cell activation syndrom,e, obesity, hypothyroidism, hypoparathyroidism, seizures, painful orthopaedic hardware, as well as diabetes mellitus 2, calling for a telemedicine video acute visit for sinusitis.     Sinusitis  This is a new problem. Episode onset: ~ 1 month ago.  she noted sore throat and congestion - she was treated with Bactrim DS and medrol dose pack.  She also called her immunologist.  my uvula is so large, I feel like I'm swallowing it.  the bactrim started to help (7 days). Progression since onset: several days after her bactrim finished her symptoms recurred.   The maximum temperature recorded prior to her arrival was 100.4 - 100.9 F (intermittent fevers). Associated symptoms include congestion, coughing, sinus pressure and a sore throat. Pertinent negatives include no shortness of breath or sneezing. Past treatments include antibiotics and saline sprays. The treatment provided significant relief.   was tested for COVID twice, negative.     Health Maintenance Due   Topic Date Due   ??? Hepatitis C Screening (MyChart)  Never done   ??? Depression Monitoring (PHQ-9)  Never done   ??? Annual Medicare Wellness Visit  Never done   ??? Comprehensive Physical Exam  Never done   ??? Immunization: Pneumococcal (1 of 4 - PCV13) Never done   ??? Immunization: COVID-19 (1) Never done   ??? HIV Screening  Never done   ??? Immunization: DTaP/Tdap/Td (1 - Tdap) Never done   ??? Immunization: Hepatitis B (1 of 3 - Risk 3-dose series) Never done   ??? Colon Cancer Screening / Stool Testing  Never done   ??? Colon Cancer Screening / Colonoscopy (MyChart)  Never done   ??? Immunization: Zoster (1 of 2) Never done   ???  Mammogram (MyChart)  06/01/2019   ??? Diabetic Foot Exam (MyChart)  11/10/2019   ??? Diabetic Eye Exam (MyChart)  11/10/2019   - HCV  - DM eye  - DM foot  - mammogram  - colonoscopy    The following portions of the patient's history were reviewed and updated as appropriate: allergies, current medications, past family history, past medical history, past social history, past surgical history and problem list.      Review of Systems   HENT: Positive for congestion, sinus pressure and sore throat. Negative for sneezing.    Respiratory: Positive for cough. Negative for shortness of breath.      Objective:    Physical Exam  Nursing note reviewed.   Constitutional:       Comments: Patient appears and sounds normal (at her baseline) on the phone, speaking in complete sentences, sounds and appears comfortable, not in any acute physical or emotional distress.       Neurological:      General: No focal deficit present.      Mental Status: She is oriented to person, place, and time. Mental status is at baseline.   Psychiatric:         Mood and Affect: Mood normal.         Behavior: Behavior normal.         Thought Content: Thought content normal.         Judgment: Judgment normal.  Assessment & Plan:   Acute bacterial sinusitis, common variable immunodeficiency: Assessed as new problem.   Patient is immunosuppressed, making a chronic infection more likely.  Patient appears to have responded to bactrim and steroids, though after each course her symptoms improved, and returned several days after completion of regimen.  I suspect her symptoms are improving with steroids rather than bactrim.  Likely due to seasonal allergies vs viral infection.  Luckily she has been negative for COVID19 (twice per patient).  [facility] (help greatly appreciated)  - discussed ddx  - given two rounds of Abx + steroids, will hold off for now  - trial conservative measures   - if symptoms persist, she will  contact her immunologist for Rx for Abx - unclear if PCN is safe in patient given her extensive history of allergies.  If her immunologist feels Augmentin is safe, but would like [place] to prescribe it, I informed patient that they can call me directly and let me know to Rx Abx.    - reviewed alarm symptoms and need to call 911 and go to ED.     Counseling on health promotion and disease prevention: Discussed current guidelines for COVID19 vaccination, current data regarding safety and efficacy. Given her extensive history of allergic reactions to other vaccines, I instructed her to bring this up with her immunologist (risks vs benefits).  If she does decide to get the vaccine, I recommended she receive the vaccine at a center attached to a hospital with rapid access to emergency services.     Hyperparathyroidism  Seizure disorder      Patient understands his/her medication(s). Proper use, potential side effects of these medications,  as well as barriers to using these medications were reviewed and addressed, as were over-the-counter medications and supplements during encounter. Response and side effects to current medications also reviewed.     This note was (at least in part) dictated using Dragon speech recognition software.  I reviewed the document in it's entirety.  Please excuse any errors.     This was a Video visit, including two-way audio and video communication, in lieu of an in-person visit. The patient provided verbal consent to participate in the telehealth visit.   I spent 24 minutes speaking with the patient, conducting an interview, performing a limited exam, and educating the patient on my assessment and plan. I also spent 10 minutes, on the same day as the encounter, preparing to see the patient (eg, review of tests), obtaining and/or reviewing separately obtained history, ordering medications, tests, or procedures, referring and communicating with other health care professionals , documenting clinical  information in the electronic or other health record, independently interpreting results and communicating results to the patient/family/caregiver, providing care coordination  and performing non-face-to-face activities.      Court Joy, MD, FACP  Associate Professor of Clinical Medicine  [facility] Faculty Practice  [place] [facility]  Department of Internal Medicine  02/06/2020   2:15 PM     *[place] is currently under a State of Emergency for the COVID-19 Pandemic*

## 2020-02-07 ENCOUNTER — Ambulatory Visit
Admit: 2020-02-07 | Discharge: 2020-02-07 | Payer: PRIVATE HEALTH INSURANCE | Attending: Student in an Organized Health Care Education/Training Program

## 2020-02-07 DIAGNOSIS — F419 Anxiety disorder, unspecified: Secondary | ICD-10-CM

## 2020-02-07 MED ORDER — clonazePAM (KLONOPIN) 0.5 MG tablet
0.5 | ORAL_TABLET | Freq: Two times a day (BID) | ORAL | 2 refills | Status: AC | PRN
Start: 2020-02-07 — End: 2020-03-08

## 2020-02-07 MED ORDER — busPIRone (BUSPAR) 10 MG tablet
10 | ORAL_TABLET | Freq: Three times a day (TID) | ORAL | 2 refills | Status: AC
Start: 2020-02-07 — End: 2020-04-10

## 2020-02-07 NOTE — Unmapped (Addendum)
I saw the patient with RESIDENT Dr. Arta Silence and I was present during the key portion of the evaluation. We discussed the diagnosis, treatment options, safety and follow up. We reviewed medical history/allergies/medications. Agree with note and plan with edits I have made  Moderate medical decision making difficulty  Anxiety  Somatic symptom disorder    [name] MD, FAPA  Psychiatrist    [facility]  [facility]  Progress Note     Emily Bonilla  06-06-1960  02/10/2020    DATE OF EVAL--02/07/2020  This was a Video visit, including two-way audio and video communication, in lieu of an in-person visit. The patient provided verbal consent to participate in the telehealth visit. I spent 33 minutes speaking with the patient, conducting an interview, performing a limited exam, and educating the patient on my assessment and plan. I also spent 6 minutes, on the same day as the encounter, obtaining and/or reviewing separately obtained history, ordering medications, tests, or procedures, referring and communicating with other health care professionals  and documenting clinical information in the electronic or other health record.    Chief Complaint:     Chief Complaint   Patient presents with   ??? Follow-up     Interval History:     Emily Bonilla is a 60 y.o. White or Caucasian female with a history of unspecified anxiety disorder, atrial fibrillation, WPW, idiopathic mast cell activation, anemia, asthma, hypothyroidism, GERD, intermittent chronic HA, PNES who [facility] to [facility] on 02/07/2020 for follow up. Last seen by Dr. Erik Obey on 10/14 and at that time her clonazepam was further decreased to 0.5mg  bid (65 tabs from 75 tabs) and she was continued on buspirone 20mg  tid, hydroxyzine 25mg  tid, and received doxepin from an outside provider.    She continues to describe anxiety associated with her extensive amount of anaphylactic episodes, she describes having  five stages of anaphylaxis and each has associated anxiety. She always has 10-12 Epipens on her at all times and uses them multiple times a week. She follows with a provider at [facility] for her mast cell activation syndrome.    Since her last visit she reports that her anxiety has been mildly increasing in large part due to the COVID pandemic and the omicron variant which is traveling through the nursing home that she resides in. This has led her to be more isolative and minimally leave her apartment. That being said she says she is still good compared to her historical levels of anxiety. She continues to engage in daily mindfulness and yoga practice.    Of note she recently had some sort of respiratory illness, unclear but was COVID negative, and has had throat swelling/pain that she is addressing with her outpatient provider.    She initially asks for a more immediate actingversion of her current benzo, we discuss utilizing other coping techniques including possibly re-establishing with therapy.       Psychiatric Review of Symptoms:     The patient denies neurovegetative symptoms of depression including: -anhedonia, -decreased energy, -decreased sleep, -guilt/low self-esteem, -decreased concentration, -appetitte changes, -sleep changes, -PMR/PMA, -SI.    The patient denies manic symptoms including: -elevated mood, -increased energy, -decreased need for sleep, -rapid speech, -racing thoughts, -impulsivity, -grandiosity, -severe irritability.    The patient denies psychotic symptoms including: -evidence of delusional thinking, -hallucinations.    Changes to Psychiatric/Social/Substance History:     No updates made    Past Medical and Past Surgical History  Past Medical History:   Diagnosis Date   ??? Anaphylaxis    ??? Anemia    ??? Anxiety    ??? Asthma    ??? Atrial fibrillation (CMS Dx)    ??? Bronchitis    ??? Heart disease    ??? Neurological disease    ??? OSA (obstructive sleep apnea)     states resolved   ??? Osteoporosis    ???  Psychogenic nonepileptic seizure    ??? Seizures (CMS Dx)    ??? Spontaneous pneumothorax     states pneumomediatsium/pneumopericardium, self resolved   ??? Thyroid disease    ??? Ulcer    ??? WPW (Wolff-Parkinson-White syndrome)        Past Surgical History:   Procedure Laterality Date   ??? BONE MARROW BIOPSY      benign   ??? CARDIAC ELECTROPHYSIOLOGY MAPPING AND ABLATION      attempted x 2, states not successful either time   ??? CHOLECYSTECTOMY  2015   ??? DENTAL EXTRACTION Right 04/25/2017    Procedure: EXTRACTION TOOTH # 31, BRIDGE RESECTIONING;  Surgeon: Gladys Damme, DMD;  Location: [facility] OR;  Service: Oral Surgery;  Laterality: Right;   ??? FACIAL COSMETIC SURGERY     ??? HYSTERECTOMY  2009   ??? INSERTION CATHETER VASCULAR ACCESS N/A 09/06/2017    Procedure: INSERTION POWER PORT A CATH;  Surgeon: Penni Bombard, MD;  Location: [facility] OR;  Service: General;  Laterality: N/A;   ??? LIVER BIOPSY      benign hepatic adednoma per patient   ??? REMOVAL HARDWARE LEG Left 08/17/2016    Procedure: REMOVAL OF HARDWARE LEFT TIBIA;  Surgeon: Andee Lineman, MD;  Location: [facility] OR;  Service: Orthopedics;  Laterality: Left;   ??? TIBIA FRACTURE SURGERY     ??? TONSILLECTOMY      + adenoids       Allergies     Allergies   Allergen Reactions   ??? Acetaminophen Anaphylaxis     Unknown   ??? Adhesive Tape-Silicones Itching, Rash, Swelling and Anaphylaxis     Unknown   ??? Alcohol Anaphylaxis   ??? Amitriptyline Anaphylaxis   ??? Aspirin Anaphylaxis     Unknown   ??? Benzoic Acid Anaphylaxis   ??? Blue Dye Anaphylaxis     Unknown   ??? Flecainide Shortness Of Breath and Anaphylaxis     Itching, decreased libido, wheezing, flushing, numbness.    ??? Gabapentin Anaphylaxis   ??? Iodinated Contrast Media Anaphylaxis   ??? Isopropyl Alcohol Anaphylaxis   ??? Latex Anaphylaxis and Rash   ??? Levofloxacin Itching and Anaphylaxis   ??? Lidocaine Anaphylaxis   ??? Nsaids (Non-Steroidal Anti-Inflammatory Drug) Anaphylaxis   ??? Onion Anaphylaxis   ??? Other Anaphylaxis     HAS MAST CELL ACTIVATION SYNDROME,  SO PATIENT HAS ANAPHYLACTIC REACTIONS IF BODY RECOGNIZES TRIGGERS  CANNOT TOLERATE ANY DYES, PRESERVATIVES, PARABENS, ETC.  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   ??? Paraben Anaphylaxis   ??? Perfume Anaphylaxis   ??? Pregabalin Anaphylaxis   ??? Preservative Anaphylaxis   ??? Quetiapine Anaphylaxis     Mental Status Change  Other reaction(s): Angioedema   ??? Red Dye Anaphylaxis     Unknown   ??? Sulfa (Sulfonamide Antibiotics) Anaphylaxis   ??? Sulfur Anaphylaxis     Pt states: Any alcohol product   ??? Trazodone Rash and Anaphylaxis   ??? Verapamil Anaphylaxis   ???  Yellow Dye Anaphylaxis     Unknown   ??? Green Dye Rash     Other reaction(s): Contact Dermatitis   ??? Onion Extract Other (See Comments)   ??? Doxepin Other (See Comments)     Sedation and sleep paralysis (side effect)   ??? Duloxetine      Other reaction(s): Other (See Comments)  Mental Status Change   ??? Epi E-Z Pen    ??? Hydroxyzine Itching     Unknown   ??? Statins-Hmg-Coa Reductase Inhibitors    ??? Verapamil (Bulk) Hives     Insomnia, flushing, scattered brain, migraine, itching, anxiety.      Home Medications:     Current Outpatient Medications   Medication Sig   ??? blood-glucose meter Use meter to test blood glucose twice daily   ??? busPIRone Take 2 tablets (20 mg total) by mouth 3 times a day.   ??? ZyrTEC Take 10 mg by mouth daily.   ??? cholecalciferol (vitamin D3) Take 1 capsule (5,000 Units total) by mouth every Monday, Wednesday, and Friday.   ??? clonazePAM Take 1 tablet (0.5 mg total) by mouth 2 times a day as needed for Anxiety for up to 30 days.   ??? cromolyn Take 5 mLs (100 mg total) by mouth 4 times daily before meals and at bedtime.   ??? diphenhydrAMINE Take 3-6 capsules (75-150 mg total) by mouth daily.   ??? doxepin TAKE ONE (1) CAPSULE BY MOUTH AT BEDTIME.   ??? dulaglutide Inject 3 mg subcutaneously every 7 days.   ??? famotidine Take 40 mg by mouth 2  times a day.   ??? fexofenadine Take 1 tablet (180 mg total) by mouth daily.   ??? fluticasone propionate Use 1 spray into each nostril daily.   ??? furosemide Take 1 tablet (20 mg total) by mouth daily.   ??? hydrOXYchloroQUINE Take by mouth daily with breakfast.   ??? hydrOXYzine HCL Take 1 tablet (25 mg total) by mouth every 8 hours as needed for Anxiety. May take two tablets at bedtime.   ??? levalbuterol Inhale 3 mL (1.25 mg total) by nebulization every 6 hours as needed for Wheezing.   ??? levothyroxine Take 1 tablet (175 mcg total) by mouth daily.   ??? methylPREDNISolone follow package directions   ??? montelukast TAKE ONE TABLET BY MOUTH AT BEDTIME.   ??? Compact Compressor Nebulizer Please dispense 1 nebulizer with tubing and mouthpiece.   ??? omeprazole TAKE 1 CAPSULE BY MOUTH 2 TIMES DAILY BEFORE MEALS   ??? onabotulinumtoxinA (cosmetic) Inject into the muscle.   ??? triamcinolone APPLY 2 TIMES DAILY   ??? True Metrix Glucose Test Strip USE TO TEST BLOOD SUGAR 2 TIMES DAILY   ??? TRUEplus Lancets USE TWICE A DAY AS DIRECTED.     No current facility-administered medications for this visit.       ROS:     Denies headache, nausea, vomiting, blurry vision, chest pain, SOB, abdominal pain, diarrhea, constipation, dysuria, dizziness, new rash    Vitals:     There were no vitals filed for this visit.    Wt Readings from Last 3 Encounters:   11/09/19 217 lb (98.4 kg)   08/03/19 212 lb 1.6 oz (96.2 kg)   07/02/19 215 lb 14.4 oz (97.9 kg)     There is no height or weight on file to calculate BMI.    Physical Exam     Deferred due to telephone visit     Mental Status Exam:  Appearance: UTA due to telephone visit  Movement: UTA due to telephone visit    Speech: fluent, with normal tone, volume, and rate  Language: appropriate to interview  Mood/Affect: euthymic/ full range, mood congruent, content appropriate  Thought Process: goal directed, generally linear, no FOI  Associations: no LOA  Thought Content: Denies SI/HI, contracting for safety  and making appropriate future plans; no delusions expressed  Perceptions: no evidence of psychotic thoughts; no AH/VH and not RTIS; denies neurovegetative symptoms  Orientation: alert, oriented to person place and situation  Fund Of Knowledge: grossly normal  Attention and Concentration: normal  Recent and Remote Memory: grossly intact    Insight/Judgment: fairly good/generally good, by recent history    Labs and Imaging:     No results found for this or any previous visit (from the past 336 hour(s)).    Risk Assessment:     Risk Assessment:   Risk factors for violence to self/others: unemployed, co-morbid anxiety, chronic medical illness, chronic pain  ??  Protective factors include: female, no previous suicide attempts, life satisfaction, positive social support, denies hopelessness, no history of trauma, no psychosis, no cognitive dysfunction, no substance use, employed, denies SI/HI, future orientation, goal orientation, contracting for safety, sobriety, collateral information is reassuring, close outpatient follow-up, compliant with recommended medications, no access to guns or weapons  ??  Overall the risk: low for future dangerousness; however the patient's imminent risk is low as the patient now: denies SI/HI, intent or plan, is clinically sober, future oriented, contracting for safety, has outpatient psychiatric follow-up established, has improved social support. As such, the patient does not meet criteria for admission to inpatient psychiatry. They remain appropriate for an outpatient level of care.    Assessment and Plan:     59 y.o.??Caucasian female, history of unspecified anxiety disorder, atrial fibrillation, WPW, idiopathic mast cell activation, anemia, asthma, hypothyroidism, GERD, intermittent chronic HA, PNES, presents for follow up. Has self decreased clonazapam to BID PRN and reports well controlled anxiety, no adverse effects from this despite several recent acute stressors. Has also self  decreased hydroxyzine. Will continue other medications as prescribed. Remains appropriate for outpatient care.   ??  Diagnoses:  -Somatic symptom disorder  -Unspecified anxiety disorder, moderate  -Cluster B personality traits  ??  # Unspecified anxiety, in partial remission              - Continue Buspirone 20 mg TID for anxiety (90 tabs, 2 refills sent)              - Continue Hydroxyzine 25 mg TID, 50mg  QHS for anxiety (no refills sent)              - Continue clonazepam 0.5mg  BID PRN (65 tablets)              - Continue treatment with integrative medicine for biofeedback, massage therapy and acupuncture              - Self-management: meditation, yoga              - Discussed all prescribed medications including risks/benefits/side effects/alternatives. Patient expressed understanding and agreement with the current treatment plan  - OARRS report run on 1/20//2021. Appropriate and consistent with previous prescriptions.   ??  # Safety:   - The patient???s imminent risk is low, see risk assessment above. At this time, does not meet criteria for involuntary psychiatric hospitalization and is appropriate for outpatient level of care.              -  Reviewed emergency contact protocol during and after clinic hours              - Patient expressed understanding of emergency resources including PES, 911, and the suicide hotline  ??  # Substance use: No active issues                # Medical: Continue regular follow up with PCP, sub specialists      Return to clinic in 3 months  Patient will be discussed in ongoing supervision with Dr. Carroll Sage, MD.    Audie Box, MD  PGY-3 Psychiatry  [facility]

## 2020-02-15 ENCOUNTER — Ambulatory Visit: Admit: 2020-02-15 | Payer: PRIVATE HEALTH INSURANCE | Attending: Family

## 2020-02-15 DIAGNOSIS — G43719 Chronic migraine without aura, intractable, without status migrainosus: Secondary | ICD-10-CM

## 2020-02-15 MED ORDER — botulinum toxin A (BOTOX) 100 unit injection
100 | INTRAMUSCULAR | Status: AC
Start: 2020-02-15 — End: 2020-02-15

## 2020-02-15 MED ORDER — botulinum toxin A (BOTOX) injection 200 Units
100 | Freq: Once | INTRAMUSCULAR | Status: AC
Start: 2020-02-15 — End: 2020-02-15
  Administered 2020-02-15: 16:00:00 200 [IU] via INTRAMUSCULAR

## 2020-02-15 MED FILL — BOTOX 100 UNIT INJECTION: 100 100 unit | INTRAMUSCULAR | Qty: 2

## 2020-02-15 NOTE — Unmapped (Signed)
MRN:  [id number]  Date: 02/15/2020   Emily Bonilla is a 60 y.o. female who comes in for scheduled Botox injection.      BOTOX PREEMPT PROTOCOL     The risks, benefits and anticipated outcomes of the procedure, the risks and benefits of the alternatives to the procedure, and the roles and tasks of the personnel to be involved, were discussed with the patient.     The side effects of onabotulinum toxin used for the treatment of chronic migraine have been described to the patient in detail.  These include: ptosis, neck pain, neck weakness, anaphylaxis, cellulitis, systemic spread (death, respiratory failure, dysarthria, dysphagia, diplopia), blood born illnesses (HIV, hepatitis, mad cow disease).   The patient is aware that any side effects related to muscle paralysis could last for 3 months.     The patient has given written informed consent to the procedure and agrees to proceed.Yes     Treatment # 11   Dilution: 5 units/0.1 ml (200 U of botox was mixed with 4 ccs of bacteriostatic saline.)  Indication: Chronic Migraine        Injection Sites:  0.1 ccs of this solution was injected into the following:  Muscle    Fixed Site/Fixed Dose  L      R   Corrugator??????????????????????????????????????10 Units divided in 2 sites??????????????????????????????????????????  ??  Procerus ??????????????????????????????????????????5 Units in 1 site ????????????????????????????????????????????????????????????????????????  ??  Frontalis????????????????????????????????????????????20 Units divided in 4 sites????????????????????????????????????????????  ??  Temporalis ??????????????????????????????????40 Units divided in 8 sites ????????????????????????????????????????  ??  Occipitalis ??????????????????????????????????????30 Units divided in 6 sites ????????????????????????????????????????  ??  Cervical PSPs??????????????????????????20 Units divided in 4 sites??????????????????????????????????????????  ??  Trapezius????????????????????????????????????????30 Units divided in 6 sites???????????????????????????????????? 30??units extra in 6 bilateral divided doses  ????????????????????????????????????????????????????????????????????????????????????????????????????????????????????????????????????????????????????????????????????????????????????????????  ???????????????????????????????????????????????????????????????????????????????????????????????????????????????????????????????????????????????????????????????????????????????????? ??????  ??  ??  Total Units used:185  Total Units wasted:15??  Patient did tolerate procedure.     Change in Treatment Plan?no    RTC: 3 Months, For Botox and PRN    Stefan Church, CNP

## 2020-02-20 ENCOUNTER — Ambulatory Visit
Admit: 2020-02-20 | Payer: PRIVATE HEALTH INSURANCE | Attending: Student in an Organized Health Care Education/Training Program

## 2020-02-20 DIAGNOSIS — L659 Nonscarring hair loss, unspecified: Secondary | ICD-10-CM

## 2020-02-20 MED ORDER — minoxidiL 5 % Foam
5 | TOPICAL | 11 refills | 30.00000 days | Status: AC
Start: 2020-02-20 — End: 2020-05-07

## 2020-02-20 NOTE — Unmapped (Signed)
Emily Bonilla is a 60 y.o. White or Caucasian female new patient with PMH of CVID, mast cell activation syndrome, hypothyroidism, low vit D and anemia      CC:   Chief Complaint   Patient presents with   ??? New Patient Visit/ Consultation     Hair Loss, Skin Check       HPI:    1. Presents with a ~ 4.5 month history of diffuse hair loss (more evident on top of her head), intermittently associated with mild pruritus and scaling of the scalp. Symptoms have improved over past month. Scalp pruritus and scaling improved with topical steroid cream PRN. No other treatments attempted. Only new medication started in the past 9 months is Hydroxychloroquine for her mast cell activation syndrome. Of note, just prior to getting established with immunologist at [facility] who specializes in mast cell disorders in July 2021,  patient was poorly controlled and was having multiple episodes of anaphylaxis per week requiring epinephrine and visits to ED, however, patient denies any significant illness or hospitalization in the past year. Patient does have a history of anemia, vitamin D defficiency and and hypothyroidism but all of these conditions are being closely followed and currently well managed (see recent set of labs bellow).  She is currently on levothyroxine daily and on calcium+D3 600mg /1600u BID.      Labs reviewed:    10/09/19  vitD=30.9ng/mL,  TSH=7.70uIU/mL, FT4=0.78ng/dL, ZO1=09.6EA/VW.    ??  11/09/19: TSH 2.19uIU/m FT4 1.02ng/dL  T3, total  09.8JX/BJ Vit D 33ng/mL    11/22/2019: BMP, LFTs and CBC with diff wnl.     2.)  Presents with `4-6 month history of rapidly enlarging asymptomatic lesion, now stable in size involving her left abdomen. Patient denies any personal history of skin cancer, but does have history of melanoma in first degree cousin, who died from it.      ADDITIONAL HISTORY:    I have reviewed past medical, social and surgical history, medications and allergies as documented  in the patient's electronic medical record.    Past Medical History:   Diagnosis Date   ??? Anaphylaxis    ??? Anemia    ??? Anxiety    ??? Asthma    ??? Atrial fibrillation (CMS Dx)    ??? Bronchitis    ??? Heart disease    ??? Neurological disease    ??? OSA (obstructive sleep apnea)     states resolved   ??? Osteoporosis    ??? Psychogenic nonepileptic seizure    ??? Seizures (CMS Dx)    ??? Spontaneous pneumothorax     states pneumomediatsium/pneumopericardium, self resolved   ??? Thyroid disease    ??? Ulcer    ??? WPW (Wolff-Parkinson-White syndrome)          ROS:     General: feels well, denies fevers, chills, unintentional weight loss  Skin: no other skin complaints except as noted in HPI    PHYSICAL EXAM:    General: alert, NAD, and well-nourished    Examination was performed of the following: scalp/hair, head/face, conjunctivae/eyelids, gums/teeth/lips, neck, breast/axilla/chest, abdomen, back, right upper extremity, left upper extremity, right lower extremity, left lower extremity, nails/digits, oral mucosa/tongue and genitalia/groin/buttocks    Abnormalities noted include:     1. Involving the vertex and frontal scalp there is partial thinning at and around the part line. No evidence of erythema, scaling or other signs of inflammation. Normal hair pull test.     2. Involving left  abdomen there is a well-demarcated ~63mm dome shaped blue-red hyperkeratotic papule.        ASSESSMENT AND PLAN:    1. 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  Ashby Dawes of diagnosis and anticipatory guidance provided to patient  - Patient's Vit D, thyroid function and hgb levels optimally managed by specialist providers  - If desired, patient may start minoxidil 5% solution/foam twice daily to affected areas; edu re irritation and increased shedding during the first month of use       **Patient is allergic to alcohol, so will make sure she is able to tolerate the foam formulation if she decides to attempt such treatment.     2.  Angiokeratoma, involving left abdomen  - Patient reassured regarding benign nature of diagnosis  - Patient will continue to monitor and contact us in case there are any concerning changes.      This patient was seen/discussed/examined with Dr. Bernell List.    Follow-up in 3 months.    Brayton Layman MD  PGY-2 Dermatology Resident                I believe my conditions(several) are contributing to extreme hair loss.  I'm loosing hand/fist fulls every couple days.  This has lasted 6 months now.   CVID (common variable immunodeficiency) (CMS Dx), Mast cell activation syndrome (CMS Dx), Hypothyroidism, low vit D and anemia with reassuring labs in October and November.     Hyperparathyroidism  Ms. Pidcock has a long history of mast cell activation syndrome and common variable immunodeficiency, and had been treated with long courses of corticosteroid and repeated IVIG.     She has a history of vitamin D deficiency and secondary hyperparathyroidism, which had improved with vitamin D supplementation.  Labs on 06/02/17 showed vitamin D=10.5 and PTH=151pg/mL on 06/02/2017.  She was prescribed ergocalciferol but could not tolerate and was switched to vitamin D3 5000units daily.  Vitamin D=28.3 on 08/25/17.  Labs on 11/10/17 showed vitD=37.7ng/mL, and PTH=79pg/mL.  ??  Labs on 10/19/18 showed Ca=9.2mg /dL, ZOX=0.96EAV/WU, JWJ=191YN/WG, A1c=7.4%.  ??  Labs on 10/09/19 showed  Ca=9.0mg /dL, NFA=2.1H/YQ, MVHQ=46.9GE/XB, MWU=132GM/WN, TSH=7.70uIU/mL, FT4=0.78ng/dL, UU7=25.3GU/YQ.    ??  She was increased to levothyroxine daily on 10/16/19.   ??  She is taking calcium+D3 600mg /1600u BID.  She consumes 1-2 servings of calcium rich foods per day, mostly in the form of daily.     TSH 11/09/19 ; TSH 2.19 FT4 1.02  T3, total  99.1 Vit D 33ng/m  11/22/2019: BMP, LFTs and CBC with diff wnl.

## 2020-02-20 NOTE — Unmapped (Addendum)
It was great to see you today!    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    The condition we discussed today is called Telogen Effluvium and is a temporary, no-scarring hair loss which lasts for up to a 1year and is associated with medications, intense physical or emotional stress.    We have ordered some baseline labwork just to make sure there are no systemic deficiencies which may be leading to hair loss.     On a normal scalp, approximately 80 to 90 percent of follicles are growing at any time. Each day, about 75 follicles shed their hair while the same number enter a new growth phase. In Telogen effluvium an increased number of hairs is  accelerated towards hair shedding phase (up to more than half of the hairs).    1. Minoxidil (Rogaine) foam 5% twice daily.  - Minoxidil promotes hair growth by lengthening the growth phase of hair follicles and causing more follicles to produce hair. The hairs that are produced tend to be larger and thicker.   - The expectation is for preventing hair loss, not regrowing hair (although you may experience some hair regrowth as well).  It takes 4-12 months before the full effect of minoxidil can be appreciated.    - You may experience hair shedding when first starting minoxidil. This effect is temporary and usually improves after 2 months. This occurs because minoxidil stimulates hair follicles to move from resting to growing, causing hairs that were going to fall out anyway to fall out sooner.  - Minoxidil is generally well tolerated, although rarely patients can develop a scalp rash from the medication.     We will see you again in 6 months    In case of any questions or concerns please do not hesitate to reach [place] on My Chart or call the dermatology clinic [phone] or [phone]    Brayton Layman, MD  Resident Physician  Dermatology  [facility]

## 2020-02-25 NOTE — Unmapped (Signed)
PT called and is requesting telehealth for appt on 04/03/20 due to not being vaccinated. Stated that she is allergic and can not get the vaccine and is limiting going out.    PT also requested lab orders be placed so she can get them done prior to appt.    Please advise. PT can be reached at [phone]

## 2020-02-25 NOTE — Unmapped (Signed)
Appt type updated.

## 2020-03-31 MED ORDER — TRUE METRIX GLUCOSE TEST STRIP Strp
ORAL_STRIP | 3 refills | 30.00000 days | Status: AC
Start: 2020-03-31 — End: ?

## 2020-03-31 NOTE — Unmapped (Signed)
Last office visit with this provider: Visit date not found  Last office visit at this location and with whom: 10/18/2019 Orbie Pyo, MD  Next scheduled office visit at this location and with whom: 04/03/2020 Orbie Pyo, MD

## 2020-04-03 ENCOUNTER — Ambulatory Visit: Admit: 2020-04-03 | Discharge: 2020-04-03 | Payer: PRIVATE HEALTH INSURANCE | Attending: "Endocrinology

## 2020-04-03 ENCOUNTER — Ambulatory Visit: Payer: PRIVATE HEALTH INSURANCE | Attending: Student in an Organized Health Care Education/Training Program

## 2020-04-03 DIAGNOSIS — E119 Type 2 diabetes mellitus without complications: Secondary | ICD-10-CM

## 2020-04-03 MED ORDER — ezetimibe (ZETIA) 10 mg tablet
10 | ORAL_TABLET | Freq: Every day | ORAL | 0 refills | Status: AC
Start: 2020-04-03 — End: 2020-05-01

## 2020-04-03 MED ORDER — dulaglutide 4.5 mg/0.5 mL PnIj
4.5 | SUBCUTANEOUS | 11 refills | Status: AC
Start: 2020-04-03 — End: 2021-04-03

## 2020-04-03 NOTE — Unmapped (Signed)
Patient ID:  60 y.o. female who presents for management of osteopenia, diabetes and hypothyroidism    This was a Video visit, including two-way audio and video communication, in lieu of an in-person visit. The patient provided verbal consent to participate in the telehealth visit.   I spent 27 minutes speaking with the patient, conducting an interview, performing a limited exam, and educating the patient on my assessment and plan. I also spent 7 minutes, on the same day as the encounter, preparing to see the patient (eg, review of tests), ordering medications, tests, or procedures, documenting clinical information in the electronic or other health record and performing non-face-to-face activities.    Past History:  Ms. 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       She reported being diagnosed with osteopenia in 2010 on outside DXA scan. Her fracture history includes a fractured tibia and fibula from falling down 2 steps after passing out.   DXA scan on 10/18/17 showed lowest T-score of -1.9 at the L femoral neck, and -0.7, +0.1, and -0.7 at the lumbar spine, L total hip and L 1/3 radius, respectively.     She has a history of vitamin D deficiency and secondary hyperparathyroidism, which had improved with vitamin D supplementation.  Labs on 06/02/17 showed vitamin D=10.5 and PTH=151pg/mL on 06/02/2017.  She was prescribed ergocalciferol but could not tolerate and was switched to vitamin D3 5000units daily.  Vitamin D=28.3 on 08/25/17.  Labs on 11/10/17 showed vitD=37.7ng/mL, and PTH=79pg/mL.  Labs on 10/19/18 showed Ca=9.2mg /dL, ZOX=0.96EAV/WU, JWJ=191YN/WG, A1c=7.4%.    She also has a history of hypothyroidism d/t hashimoto's disease on levothyroxine. She saw Dr. Angelia Mould on 06/29/2017, who performed in-office ultrasound that showed heterogenous thyroid with no discrete nodule, c/w  thyroiditis.     She was diagnosed with prediabetes in 2017, but had rapid weight gain in early 2019 and progressed to Type 2 diabetes with A1c=7.3% on 06/09/17.  So she was started on metformin 500mg  BID. A1c=6.7% on 10/12/17   Outside A1c= 5.5% on 01/31/18    MRI on 12/13/18 showed increased visceral fat and hepatic steatosis.     In 10/2018 she discontinued metformin and titrated trulicity up to 1.5mg  per week.  A1c=6.6% on 05/17/19    Labs on 10/09/19 showed Ca=9.0mg /dL, NFA=2.1H/YQ, MVHQ=46.9GE/XB, MWU=132GM/WN, TSH=7.70uIU/mL, FT4=0.78ng/dL, UU7=25.3GU/YQ.      She was increased to levothyroxine daily on 10/16/19.     DXA scan on 10/09/19 showed lowest T-score of -2.3 at the L femoral neck, and -1.0, -0.1, and -0.4 at the lumbar spine, L total hip, and the L 1/3 radius, respectively.    Her A1c=6.5% on 10/09/19    Interval History:    Since her previous appt on 10/18/19, she increased to dulaglutide 3.0mg  weekly and is tolerating well.  She checks her BGs daily and reports AM BG 90-135mg /dL. She denies having any low blood sugars.   A1c=6.6% on 03/24/20.     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   She gained about 6lbs.  She does yoga daily, and walks the stairs 2-3x per week.       She takes levothyroxine daily.     She is taking calcium+D3 600mg /1600u BID.  She consumes 1-2 servings of calcium rich foods per day, mostly in the form of daily.     She denies having any  falls or fractures.  She still has back pain.     She does not tolerate statins d/t muscle spasms.   She reports that her BP is good when she checks at home.  She checked while on video and showed a result of 134/31mmHg.    PMH:  - Mast cell activation syndrome  - CVID  - osteopenia  - hypothyroidism d/t hashimotos's  - Type 2 diabetes mellitus    Family Hx:  Paternal grandmother - CADw/MI s/p CABG  Maternal grandfather - CADw/MI  Maternal uncle - CADw/MI  Maternal aunt - mastoctytosis, osteoporosis  Mom- thyroid cancer,  hypothyroid, mastocytosis, HLD, osteoporosis  Niece- thyroid cancer, hypothyroid  Nephew- thyroid cancer, hypothyroid  Brother -  Type 1 diabetes, HLD, CADw/MI s/p CABG PCI     Social Hx:  - lives in [place], [place]  - No smoking, EtOH or illicit drugs    Current Outpatient Medications   Medication Sig Dispense Refill   ??? blood-glucose meter Misc Use meter to test blood glucose twice daily 1 each 0   ??? budesonide (PULMICORT) 0.5 mg/2 mL Take 0.5 mg by mouth.     ??? busPIRone (BUSPAR) 10 MG tablet Take 2 tablets (20 mg total) by mouth 3 times a day. 180 tablet 2   ??? cetirizine (ZYRTEC) 10 mg Cap Take 10 mg by mouth daily. 30 capsule 2   ??? cholecalciferol, vitamin D3, 125 mcg (5,000 unit) capsule Take 1 capsule (5,000 Units total) by mouth every Monday, Wednesday, and Friday. 40 capsule 3   ??? cromolyn (GASTROCROM) 100 mg/5 mL solution Take 5 mLs (100 mg total) by mouth 4 times daily before meals and at bedtime. 480 mL 11   ??? diphenhydrAMINE (BENADRYL) 25 mg capsule Take 3-6 capsules (75-150 mg total) by mouth daily. 90 capsule 2   ??? doxepin (SINEQUAN) 10 MG capsule TAKE ONE (1) CAPSULE BY MOUTH AT BEDTIME. 30 capsule 0   ??? dulaglutide 3 mg/0.5 mL PnIj Inject 3 mg subcutaneously every 7 days. 6 mL 3   ??? famotidine (PEPCID) 40 MG tablet Take 40 mg by mouth 2 times a day.     ??? fexofenadine (ALLEGRA) 180 MG tablet Take 1 tablet (180 mg total) by mouth daily. 30 tablet 3   ??? fluticasone propionate (FLONASE) 50 mcg/actuation nasal spray Use 1 spray into each nostril daily. 16 g 1   ??? furosemide (LASIX) 20 MG tablet Take 1 tablet (20 mg total) by mouth daily. 30 tablet 0   ??? hydrOXYchloroQUINE (PLAQUENIL) 200 mg tablet Take by mouth daily with breakfast.     ??? hydrOXYzine HCL (ATARAX) 25 MG tablet Take 1 tablet (25 mg total) by mouth every 8 hours as needed for Anxiety. May take two tablets at bedtime. 120 tablet 2   ??? levalbuterol (XOPENEX) 1.25 mg/3 mL nebulizer solution Inhale 3 mL (1.25 mg total) by nebulization every 6  hours as needed for Wheezing. 72 mL 0   ??? levothyroxine (SYNTHROID) 175 MCG tablet Take 1 tablet (175 mcg total) by mouth daily. 90 tablet 3   ??? methylPREDNISolone (MEDROL DOSEPACK) 4 mg tablet follow package directions 21 each 0   ??? minoxidiL 5 % Foam Apply to scalp once to twice daily. 1 g 11   ??? montelukast (SINGULAIR) 10 mg tablet TAKE ONE TABLET BY MOUTH AT BEDTIME. 30 tablet 0   ??? nebulizers (COMPACT COMPRESSOR NEBULIZER) Misc Please dispense 1 nebulizer with tubing and mouthpiece. 1 each 0   ??? omeprazole (PRILOSEC)  40 MG capsule TAKE 1 CAPSULE BY MOUTH 2 TIMES DAILY BEFORE MEALS 60 capsule 5   ??? onabotulinumtoxinA (BOTOX COSMETIC) 50 unit SolR Inject into the muscle.     ??? triamcinolone (KENALOG) 0.1 % ointment APPLY 2 TIMES DAILY 80 g 11   ??? TRUE METRIX GLUCOSE TEST STRIP Strp USE TO TEST BLOOD SUGAR 2 TIMES DAILY 100 strip 3   ??? TRUEPLUS LANCETS 30 gauge Misc USE TWICE A DAY AS DIRECTED. 100 each 3     No current facility-administered medications for this visit.     Objective:  Physical Exam:  Gen: caucasian female  in NAD, alert, cooperative   Psych: normal speech and mood, appropriate thought content    Remaining exam deferred due to televisit     Labs:  Component      Latest Ref Rng & Units 10/09/2019 11/09/2019 04/02/2020   Creatinine      0.60 - 1.30 mg/dL 1.61 0.96    Glucose      70 - 100 mg/dL 045 (H) 409 (H)    Calcium      8.6 - 10.3 mg/dL 9.0 9.1    Phosphorus      2.1 - 4.7 mg/dL 3.5 4.1    Albumin      3.5 - 5.7 g/dL 4.2 4.3    Calculated Osmolality, Serum      278 - 305 mOsm/kg 305 297    eGFR AA CKD-EPI      See note. 86 89    eGFR NONAA CKD-EPI      See note. 74 77    Cholesterol, Total      0 - 200 mg/dL 811 (H)     Triglycerides, Serum      10 - 149 mg/dL 914 (H)     HDL      60 - 92 mg/dL 38 (L)     LDL Cholesterol      mg/dL 782     Vit D, [id number]-[id number]      30.0 - 100.0 ng/mL 30.9 33.0    PTH      12.0 - 88.0 pg/mL 155.0 (H) 119.0 (H)    Free T4      0.61 - 1.76 ng/dL 5.78 4.69    TSH       0.45 - 4.12 uIU/mL 7.70 (H) 2.19    T3, Total      60.0 - 220.0 ng/dL 62.9 52.8    Hemoglobin A1C       6.5 (H)  6.6     Imaging:      Date LS Left FN Left TH Right FN Right TH 1/3 FA     10/09/19 BMD:0.933g/cm2  T-score: -1.0 SD   Z-score: 0.2 SD BMD:0.589g/cm2   T-score: -2.3 ??SD   Z-score: -1.1 SD BMD: 0.926g/cm2   T-score: -0.1 SD   Z-score: 0.7 SD BMD: 0.638 g/cm2   T-score: -1.9 SD   Z-score: -0.7 SD  BMD: 0.863g/cm2   T-score: -0.7 SD   Z-score: 0.2 SD  BMD: 0.668g/cm2   T-score: -0.4 SD   Z-score: 0.7 SD      10/18/17 BMD:0.970g/cm2  T-score: -0.7 SD   Z-score: 0.5 SD BMD:0.633 g/cm2  T-score: -1.9  SD  Z-score: -0.8 SD BMD:0.953 g/cm2   T-score: 0.1 SD   Z-score: 0.8 SD  BMD: 0.651 g/cm2  T-score: -1.8  SD  Z-score: -0.7 SD BMD: 0.877g/cm2   T-score: -0.5 SD   Z-score: 0.2 SD BMD: 0.650 g/cm2  T-score: -0.7  SD  Z-score: 0.3 SD     Assessment/Plan:  60 y.o. female with osteopenia, hypothyroidism, hyperparathyroidism, and Type 2 diabetes.    Ms. Stumpp has bone density in the osteopenic range that is slightly decreased in the interval between her last 2 DXA scans.  While she has multiple risk factors for low bone density, her estimated risk for fracture by FRAX remains below the threshold for treatment.  Therefore we will not start pharmacologic therapy for her bones at this time.  She is due for another DXA scan in 10/2021.       She has elevated PTH with normal serum calcium levels c/w secondary hyperparathyroidism likely due to inadequate calcium and vitamin D intake/absorption due to her GI issues.  Her vitamin D level is borderline, so we will increase her vitamin D supplement.       She also has hypothyroidism d/t hashimoto's disease.  Her TFTs were in the reference ranged on recent labs.  Therefore we will continue levothyroxine daily.    She has good glycemic control as indicated by her A1c=6.6%.  She is tolerating trulicity 3.0mg  weekly with out adverse effects.  So we will titrate up to  maximal dose of 4.5mg  weekly to increase weight loss benefit.  Her LDL and triglycerides remain, elevated, but is not able to tolerate statins.  Therefore we wills start ezetimibe.     Plan:  - increase dulaglutide to 4.5mg  weekly  - continue levothyroxine daily, for now  - 1200mg  of calcium daily form diet and supplements combined  - start ezetimibe 10mg  daily  - check renal panel, PTH, vitamin D level   - Check lipid panel in 3 months    Follow-up in 6 months.    The past history was reviewed and copied forward (with edits) from a my note written on 10/18/19. I have reviewed and updated the history, physical exam, data, assessment, and plan of the note so that it reflects my current evaluation and management of the patient.

## 2020-04-03 NOTE — Unmapped (Addendum)
Increase Trulicity to 4.5mg  weekly  Continue levothyroxine daily  Start ezetimibe 10mg  daily    Please go to the lab to check lipid, thyroid, and Parathyroid.    Try to get at least of exercise weekly.

## 2020-04-10 ENCOUNTER — Ambulatory Visit
Admit: 2020-04-10 | Discharge: 2020-04-10 | Payer: PRIVATE HEALTH INSURANCE | Attending: Student in an Organized Health Care Education/Training Program

## 2020-04-10 DIAGNOSIS — F411 Generalized anxiety disorder: Secondary | ICD-10-CM

## 2020-04-10 NOTE — Unmapped (Signed)
I saw the patient with resident Dr Erik Obey MD and I was present during the key portion of the  evaluation. We discussed the diagnosis, treatment options, safety and follow up. We reviewed medical history/allergies/medications. Agree with note and plan with the edits I have made.    [name] MD, FAPA  Psychiatrist    [facility]  [facility]  Progress Note  Video Visit     Emily Bonilla  [birth year]  04/10/2020  Total Visit Time: 16:13 - 16:47    This was a Video visit, including two-way audio and video communication, in lieu of an in-person visit. The patient provided verbal consent to participate in the telehealth visit. I spent 34 minutes speaking with the patient, conducting an interview, performing a limited exam, and educating the patient on my assessment and plan. I also spent 10 minutes, on the same day as the encounter, preparing to see the patient (eg, review of tests), ordering medications, tests, or procedures, referring and communicating with other health care professionals  and documenting clinical information in the electronic or other health record.    Chief Complaint:     Chief Complaint   Patient presents with   ??? Follow-up     Interval History:     Emily Bonilla is a 60 y.o. White or Caucasian female with a history of unspecified anxiety disorder, atrial fibrillation, WPW, idiopathic mast cell activation, anemia, asthma, hypothyroidism, GERD, intermittent chronic HA, PNES who [facility] to [facility] on 04/10/2020 for follow up. Last seen by [name], MD on 02/07/2020. Current medications include: buspirone 20 mg TID, hydroxyzine 25 mg TID, 50??mg QHS;??clonazepam??0.5mg  BID (65 tabs).  Pt asked to summarize events since last visit. Has been missing doses of buspirone; would like to cut back on afternoon dose. Hydroxyzine is only taken at night. Continues to take clonazepam twice a day which she feels helps her with anxiety,  sleep. Denies panic attacks.   Continues to have family stressors - mother is in the process of being sent to memory care. Feels she has been dealing with this stress appropriately.   Has 3 tumor lesions (polyps?) on roof of mouth during dentist appointment. Has appointment with ENT scheduled on 3/31. Has not had any anaphylactic episodes since last visit.   No issues with sleep or appetite. Feels refreshed in the AM. Denies SI, HI.     Psychiatric Review of Symptoms:     The patient denies neurovegetative symptoms of depression including: -anhedonia, -decreased energy, -decreased sleep, -guilt/low self-esteem, -decreased concentration, -appetitte changes, -sleep changes, -PMR/PMA, -SI.  The patient denies manic symptoms including: -elevated mood, -increased energy, -decreased need for sleep, -rapid speech, -racing thoughts, -impulsivity, -grandiosity, -severe irritability.  The patient denies psychotic symptoms including: -evidence of delusional thinking, -hallucinations.  Changes to Psychiatric/Social/Substance History:     No updates made    Past Medical and Past Surgical History     Past Medical History:   Diagnosis Date   ??? Anaphylaxis    ??? Anemia    ??? Anxiety    ??? Asthma    ??? Atrial fibrillation (CMS Dx)    ??? Bronchitis    ??? Heart disease    ??? Neurological disease    ??? OSA (obstructive sleep apnea)     states resolved   ??? Osteoporosis    ??? Psychogenic nonepileptic seizure    ??? Seizures (CMS Dx)    ??? Spontaneous pneumothorax     states pneumomediatsium/pneumopericardium,  self resolved   ??? Thyroid disease    ??? Ulcer    ??? WPW (Wolff-Parkinson-White syndrome)        Past Surgical History:   Procedure Laterality Date   ??? BONE MARROW BIOPSY      benign   ??? CARDIAC ELECTROPHYSIOLOGY MAPPING AND ABLATION      attempted x 2, states not successful either time   ??? CHOLECYSTECTOMY  2015   ??? DENTAL EXTRACTION Right 04/25/2017    Procedure: EXTRACTION TOOTH # 31, BRIDGE RESECTIONING;  Surgeon: Gladys Damme, DMD;  Location:  [facility] OR;  Service: Oral Surgery;  Laterality: Right;   ??? FACIAL COSMETIC SURGERY     ??? HYSTERECTOMY  2009   ??? INSERTION CATHETER VASCULAR ACCESS N/A 09/06/2017    Procedure: INSERTION POWER PORT A CATH;  Surgeon: Penni Bombard, MD;  Location: [facility] OR;  Service: General;  Laterality: N/A;   ??? LIVER BIOPSY      benign hepatic adednoma per patient   ??? REMOVAL HARDWARE LEG Left 08/17/2016    Procedure: REMOVAL OF HARDWARE LEFT TIBIA;  Surgeon: Andee Lineman, MD;  Location: [facility] OR;  Service: Orthopedics;  Laterality: Left;   ??? TIBIA FRACTURE SURGERY     ??? TONSILLECTOMY      + adenoids       Allergies     Allergies   Allergen Reactions   ??? Acetaminophen Anaphylaxis     Unknown   ??? Adhesive Tape-Silicones Itching, Rash, Swelling and Anaphylaxis     Unknown   ??? Alcohol Anaphylaxis   ??? Amitriptyline Anaphylaxis   ??? Aspirin Anaphylaxis     Unknown   ??? Benzoic Acid Anaphylaxis   ??? Blue Dye Anaphylaxis     Unknown   ??? Flecainide Shortness Of Breath and Anaphylaxis     Itching, decreased libido, wheezing, flushing, numbness.    ??? Gabapentin Anaphylaxis   ??? Iodinated Contrast Media Anaphylaxis   ??? Isopropyl Alcohol Anaphylaxis   ??? Latex Anaphylaxis and Rash   ??? Levofloxacin Itching and Anaphylaxis   ??? Lidocaine Anaphylaxis   ??? Nsaids (Non-Steroidal Anti-Inflammatory Drug) Anaphylaxis   ??? Onion Anaphylaxis   ??? 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   ??? Paraben Anaphylaxis   ??? Perfume Anaphylaxis   ??? Pregabalin Anaphylaxis   ??? Preservative Anaphylaxis   ??? Quetiapine Anaphylaxis     Mental Status Change  Other reaction(s): Angioedema   ??? Red Dye Anaphylaxis     Unknown   ??? Sulfa (Sulfonamide Antibiotics) Anaphylaxis   ??? Sulfur Anaphylaxis      Pt states: Any alcohol product   ??? Trazodone Rash and Anaphylaxis   ??? Verapamil Anaphylaxis   ??? Yellow Dye Anaphylaxis     Unknown   ??? Green Dye Rash     Other reaction(s): Contact Dermatitis   ??? Onion Extract Other (See Comments)   ??? Doxepin Other (See Comments)     Sedation and sleep paralysis (side effect)   ??? Duloxetine      Other reaction(s): Other (See Comments)  Mental Status Change   ??? Epi E-Z Pen    ??? Hydroxyzine Itching     Unknown   ??? Statins-Hmg-Coa Reductase Inhibitors    ??? Verapamil (Bulk)  Hives     Insomnia, flushing, scattered brain, migraine, itching, anxiety.      Home Medications:     Current Outpatient Medications   Medication Sig   ??? blood-glucose meter Use meter to test blood glucose twice daily   ??? budesonide Take 0.5 mg by mouth.   ??? busPIRone Take 2 tablets (20 mg total) by mouth 3 times a day.   ??? ZyrTEC Take 10 mg by mouth daily.   ??? cholecalciferol (vitamin D3) Take 1 capsule (5,000 Units total) by mouth every Monday, Wednesday, and Friday.   ??? cromolyn Take 5 mLs (100 mg total) by mouth 4 times daily before meals and at bedtime.   ??? diphenhydrAMINE Take 3-6 capsules (75-150 mg total) by mouth daily.   ??? doxepin TAKE ONE (1) CAPSULE BY MOUTH AT BEDTIME.   ??? dulaglutide Inject 4.5 mg subcutaneously every 7 days.   ??? ezetimibe Take 1 tablet (10 mg total) by mouth daily.   ??? famotidine Take 40 mg by mouth 2 times a day.   ??? fexofenadine Take 1 tablet (180 mg total) by mouth daily.   ??? fluticasone propionate Use 1 spray into each nostril daily.   ??? furosemide Take 1 tablet (20 mg total) by mouth daily.   ??? hydrOXYchloroQUINE Take by mouth daily with breakfast.   ??? hydrOXYzine HCL Take 1 tablet (25 mg total) by mouth every 8 hours as needed for Anxiety. May take two tablets at bedtime.   ??? levalbuterol Inhale 3 mL (1.25 mg total) by nebulization every 6 hours as needed for Wheezing.   ??? levothyroxine Take 1 tablet (175 mcg total) by mouth daily.   ??? methylPREDNISolone follow package  directions   ??? minoxidiL Apply to scalp once to twice daily.   ??? montelukast TAKE ONE TABLET BY MOUTH AT BEDTIME.   ??? Compact Compressor Nebulizer Please dispense 1 nebulizer with tubing and mouthpiece.   ??? omeprazole TAKE 1 CAPSULE BY MOUTH 2 TIMES DAILY BEFORE MEALS   ??? onabotulinumtoxinA (cosmetic) Inject into the muscle.   ??? triamcinolone APPLY 2 TIMES DAILY   ??? True Metrix Glucose Test Strip USE TO TEST BLOOD SUGAR 2 TIMES DAILY   ??? TRUEplus Lancets USE TWICE A DAY AS DIRECTED.     No current facility-administered medications for this visit.       ROS:     Denies headache, nausea, vomiting, blurry vision, chest pain, SOB, abdominal pain, diarrhea, constipation, dysuria, dizziness, new rash    Vitals:     There were no vitals filed for this visit.    Wt Readings from Last 3 Encounters:   02/20/20 217 lb (98.4 kg)   02/15/20 217 lb (98.4 kg)   11/09/19 217 lb (98.4 kg)     There is no height or weight on file to calculate BMI.    Physical Exam     Constitutional: She is oriented to person, place, and time. She is well-developed, well-nourished, and in no acute distress.    Head: Normocephalic and atraumatic.   Eyes: EOM are normal.   Pulmonary/Chest: Effort normal. No respiratory distress.   Musculoskeletal: Normal range of motion.   Neurological: She is alert and oriented to person, place, and time.      Mental Status Exam:     Appearance: appears stated age, good eye contact, cooperative with interview  Movement:  no PMR/PMA, no abnormal movements appreciated    Speech: fluent, with normal tone, volume, and rate  Language: appropriate  to interview  Mood/Affect: euthymic/ full range, mood congruent, content appropriate  Thought Process: goal directed, generally linear, no FOI  Associations: no LOA  Thought Content: Denies SI/HI, contracting for safety and making appropriate future plans; no delusions expressed  Perceptions: no evidence of psychotic thoughts; no AH/VH and not RTIS; denies neurovegetative  symptoms  Orientation: alert, oriented to person place and situation  Fund Of Knowledge: grossly normal  Attention and Concentration: normal  Recent and Remote Memory: grossly intact    Insight/Judgment: fairly good/generally good, by recent history    Labs and Imaging:     Recent Results (from the past 336 hour(s))   Hemoglobin A1c    Collection Time: 04/02/20 12:00 AM   Result Value Ref Range    Hemoglobin A1C 6.6      Reviewed labs as noted above.  Risk Assessment:     Risk Assessment:   Risk factors for violence to self/others: unemployed, co-morbid anxiety, chronic medical illness, chronic pain  ??  Protective factors include: female, no previous suicide attempts, life satisfaction, positive social support, denies hopelessness, no history of trauma, no psychosis, no cognitive dysfunction, no substance use, employed, denies SI/HI, future orientation, goal orientation, contracting for safety, sobriety, collateral information is reassuring, close outpatient follow-up, compliant with recommended medications, no access to guns or weapons  ??  Overall the risk:??low??for future dangerousness; however the patient's imminent risk??is low??as the patient now: denies SI/HI, intent or plan, is clinically sober, future oriented, contracting for safety, has outpatient psychiatric follow-up established, has improved social support. As such, the patient does not meet criteria for admission to inpatient psychiatry. They remain appropriate for an outpatient level of care.    Assessment and Plan:     60 y.o.??Caucasian female, history of unspecified anxiety disorder, atrial fibrillation, WPW, idiopathic mast cell activation, anemia, asthma, hypothyroidism, GERD, intermittent chronic HA, PNES, presents for follow up. In a joint effort to limit sedating medication, pt has self decreased clonazapam to BID PRN and reports well controlled anxiety, no adverse effects from this despite several recent acute stressors. Has also self decreased  hydroxyzine to night only and buspirone to BID. Will continue other medications as prescribed. Remains appropriate for outpatient care.??  ??  DSM-V Diagnoses:  -Unspecified anxiety disorder, moderate  -Cluster B personality traits    ICD-9-CM ICD-10-CM    1. GAD (generalized anxiety disorder)  300.02 F41.1 clonazePAM (KLONOPIN) 0.5 MG tablet   2. Somatic symptom disorder  300.82 F45.1    3. 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  #??Unspecified anxiety,??in partial remission  ????????????????????????- Continue Buspirone 20 mg BID for anxiety (960 tabs, 1 refill sent)  ????????????????????????- Continue Hydroxyzine 50mg  QHS for anxiety (no refills sent)  ????????????????????????- Decrease clonazepam??0.5mg  BID PRN (65 tabs ---> 60 tabs, 1 refill sent to pharmacy)??  ????????????????????????- Continue treatment with integrative medicine for biofeedback, massage therapy and acupuncture  ????????????????????????- Self-management: meditation, yoga????????????????????????  - Discussed all prescribed medications including risks/benefits/side effects/alternatives. Patient expressed understanding and agreement with the current treatment plan  - [place]RS report run on??04/10/2020. Last script filled on 04/01/2019. Appropriate and consistent with previous prescriptions.     Controlled Substance Monitoring 09/26/2016 03/21/2018 09/14/2018 03/29/2019 07/12/2019 04/11/2020   OARRS/eKASPER Status Reviewed Reviewed Reviewed Reviewed Reviewed Reviewed   OARRS/eKASPER Consistent with Prescriber Expectation Dyanne Carrel     I have completed the required OARRS/eKASPER documentation for this patient on 04/10/2020.  CINDY  NNEKA CHIDI BOUDREAUX    # Safety:??  - The patient???s imminent risk is low, see risk assessment above. At this time, does not meet criteria for involuntary psychiatric hospitalization and is appropriate for outpatient level of care.  ????????????????????????- Reviewed emergency contact protocol during and after clinic hours  ????????????????????????- Patient expressed understanding of  emergency resources including PES, 911, and the suicide hotline  ??  # Substance use: No active issues  ????????????????????????  # Medical: Continue regular follow up with PCP, sub specialists??     Return to clinic in 3 months  Patient will be discussed in ongoing supervision with [name], MD.    Ronn Melena, MD  PGY-3 Psychiatry  [facility]

## 2020-04-11 MED ORDER — busPIRone (BUSPAR) 10 MG tablet
10 | ORAL_TABLET | Freq: Two times a day (BID) | ORAL | 0 refills | Status: AC
Start: 2020-04-11 — End: 2020-06-19

## 2020-04-11 MED ORDER — clonazePAM (KLONOPIN) 0.5 MG tablet
0.5 | ORAL_TABLET | Freq: Two times a day (BID) | ORAL | 1 refills | Status: AC | PRN
Start: 2020-04-11 — End: 2020-06-19

## 2020-04-17 ENCOUNTER — Ambulatory Visit: Admit: 2020-04-17 | Discharge: 2020-04-17 | Payer: PRIVATE HEALTH INSURANCE

## 2020-04-17 DIAGNOSIS — J3489 Other specified disorders of nose and nasal sinuses: Secondary | ICD-10-CM

## 2020-04-17 MED ORDER — doxycycline (MONODOX) 100 MG capsule
100 | ORAL_CAPSULE | Freq: Two times a day (BID) | ORAL | 0 refills | Status: AC
Start: 2020-04-17 — End: 2020-05-07

## 2020-04-17 MED ORDER — mupirocin (BACTROBAN) 2 % ointment
2 | Freq: Three times a day (TID) | TOPICAL | 3 refills | 7.00000 days | Status: AC
Start: 2020-04-17 — End: ?

## 2020-04-17 NOTE — Unmapped (Signed)
[facility]  Department of Otolaryngology--Head and Neck Surgery  Division of Rhinology, Allergy, and Anterior Skull Base Surgery  Lorretta Harp, MD  [facility]  [address] [place], [place] [address]  Telephone: [phone]      Pt name: Emily Bonilla  Date: 04/17/2020   MRN: [id number]    Chief Complaint:  Chief Complaint   Patient presents with   ??? New Consult     Sinus and throat issues        Referred by: Court Joy, MD    History Present Illness:  This is a 60 y.o. female who presents with increased sinonasal symptoms and sinus infections over the last 3 months.  She reports he had a sinus infection in January which was associated with facial pressure, thick nasal drainage, otorrhea, sore throat and nasal congestion.  She was given a course of Bactrim for 14 days and 2 separate Medrol Dosepaks with improvement although 1 week off this medication her sinus issues return.  She is currently using saline nasal irrigations, intranasal topical steroid sprays, Astelin nasal spray and oral antihistamines.  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 She has never had sinonasal surgery.  She does have a history of allergy and has been tested before.    Separately she reports a metallic taste in her tongue and the burning sensation of her tongue and palate.  She also reports she is aspirating more than normal.  She follows with Dr. Providence Lanius for these issues.    Radiology Findings:   No results found for the past 12 months      Past Medical History:  Past Medical History:   Diagnosis Date   ??? Anaphylaxis    ??? Anemia    ??? Anxiety    ??? Asthma    ??? Atrial fibrillation (CMS Dx)    ??? Bronchitis    ??? Heart disease    ??? Neurological disease    ??? OSA (obstructive sleep apnea)     states resolved   ??? Osteoporosis    ??? Psychogenic nonepileptic seizure    ??? Seizures (CMS Dx)    ??? Spontaneous pneumothorax     states  pneumomediatsium/pneumopericardium, self resolved   ??? Thyroid disease    ??? Ulcer    ??? WPW (Wolff-Parkinson-White syndrome)        Past Surgical History:  Past Surgical History:   Procedure Laterality Date   ??? BONE MARROW BIOPSY      benign   ??? CARDIAC ELECTROPHYSIOLOGY MAPPING AND ABLATION      attempted x 2, states not successful either time   ??? CHOLECYSTECTOMY  2015   ??? DENTAL EXTRACTION Right 04/25/2017    Procedure: EXTRACTION TOOTH # 31, BRIDGE RESECTIONING;  Surgeon: Gladys Damme, DMD;  Location: [facility] OR;  Service: Oral Surgery;  Laterality: Right;   ??? FACIAL COSMETIC SURGERY     ??? HYSTERECTOMY  2009   ??? INSERTION CATHETER VASCULAR ACCESS N/A 09/06/2017    Procedure: INSERTION POWER PORT A CATH;  Surgeon: Penni Bombard, MD;  Location: [facility] OR;  Service: General;  Laterality: N/A;   ??? LIVER BIOPSY      benign hepatic adednoma per patient   ??? REMOVAL HARDWARE LEG Left 08/17/2016    Procedure: REMOVAL OF HARDWARE LEFT TIBIA;  Surgeon: Andee Lineman, MD;  Location: [facility] OR;  Service: Orthopedics;  Laterality: Left;   ??? TIBIA FRACTURE  SURGERY     ??? TONSILLECTOMY      + adenoids       Medications:  Current Outpatient Medications on File Prior to Visit   Medication Sig Dispense Refill   ??? blood-glucose meter Misc Use meter to test blood glucose twice daily 1 each 0   ??? budesonide (PULMICORT) 0.5 mg/2 mL Take 0.5 mg by mouth.     ??? busPIRone (BUSPAR) 10 MG tablet Take 1 tablet (10 mg total) by mouth in the morning and at bedtime. Indications: Generalized Anxiety Disorder 180 tablet 0   ??? cetirizine (ZYRTEC) 10 mg Cap Take 10 mg by mouth daily. 30 capsule 2   ??? cholecalciferol, vitamin D3, 125 mcg (5,000 unit) capsule Take 1 capsule (5,000 Units total) by mouth every Monday, Wednesday, and Friday. 40 capsule 3   ??? [START ON 04/30/2020] clonazePAM (KLONOPIN) 0.5 MG tablet Take 1 tablet (0.5 mg total) by mouth 2 times a day as needed for Anxiety for up to 60 days. 60 tablet 1   ??? cromolyn (GASTROCROM) 100 mg/5 mL solution Take  5 mLs (100 mg total) by mouth 4 times daily before meals and at bedtime. 480 mL 11   ??? diphenhydrAMINE (BENADRYL) 25 mg capsule Take 3-6 capsules (75-150 mg total) by mouth daily. 90 capsule 2   ??? doxepin (SINEQUAN) 10 MG capsule TAKE ONE (1) CAPSULE BY MOUTH AT BEDTIME. 30 capsule 0   ??? dulaglutide 4.5 mg/0.5 mL PnIj Inject 4.5 mg subcutaneously every 7 days. 2 mL 11   ??? ezetimibe (ZETIA) 10 mg tablet Take 1 tablet (10 mg total) by mouth daily. 30 tablet 0   ??? famotidine (PEPCID) 40 MG tablet Take 40 mg by mouth 2 times a day.     ??? fexofenadine (ALLEGRA) 180 MG tablet Take 1 tablet (180 mg total) by mouth daily. 30 tablet 3   ??? fluticasone propionate (FLONASE) 50 mcg/actuation nasal spray Use 1 spray into each nostril daily. 16 g 1   ??? furosemide (LASIX) 20 MG tablet Take 1 tablet (20 mg total) by mouth daily. 30 tablet 0   ??? hydrOXYchloroQUINE (PLAQUENIL) 200 mg tablet Take by mouth daily with breakfast.     ??? hydrOXYzine HCL (ATARAX) 25 MG tablet Take 1 tablet (25 mg total) by mouth every 8 hours as needed for Anxiety. May take two tablets at bedtime. 120 tablet 2   ??? levalbuterol (XOPENEX) 1.25 mg/3 mL nebulizer solution Inhale 3 mL (1.25 mg total) by nebulization every 6 hours as needed for Wheezing. 72 mL 0   ??? levothyroxine (SYNTHROID) 175 MCG tablet Take 1 tablet (175 mcg total) by mouth daily. 90 tablet 3   ??? methylPREDNISolone (MEDROL DOSEPACK) 4 mg tablet follow package directions 21 each 0   ??? minoxidiL 5 % Foam Apply to scalp once to twice daily. 1 g 11   ??? montelukast (SINGULAIR) 10 mg tablet TAKE ONE TABLET BY MOUTH AT BEDTIME. 30 tablet 0   ??? nebulizers (COMPACT COMPRESSOR NEBULIZER) Misc Please dispense 1 nebulizer with tubing and mouthpiece. 1 each 0   ??? omeprazole (PRILOSEC) 40 MG capsule TAKE 1 CAPSULE BY MOUTH 2 TIMES DAILY BEFORE MEALS 60 capsule 5   ??? onabotulinumtoxinA (BOTOX COSMETIC) 50 unit SolR Inject into the muscle.     ??? triamcinolone (KENALOG) 0.1 % ointment APPLY 2 TIMES DAILY 80 g  11   ??? TRUE METRIX GLUCOSE TEST STRIP Strp USE TO TEST BLOOD SUGAR 2 TIMES DAILY 100 strip 3   ???  TRUEPLUS LANCETS 30 gauge Misc USE TWICE A DAY AS DIRECTED. 100 each 3     No current facility-administered medications on file prior to visit.       Medication Allergies:  Allergies   Allergen Reactions   ??? Acetaminophen Anaphylaxis     Unknown   ??? Adhesive Tape-Silicones Itching, Rash, Swelling and Anaphylaxis     Unknown   ??? Alcohol Anaphylaxis   ??? Amitriptyline Anaphylaxis   ??? Aspirin Anaphylaxis     Unknown   ??? Benzoic Acid Anaphylaxis   ??? Blue Dye Anaphylaxis     Unknown   ??? Flecainide Shortness Of Breath and Anaphylaxis     Itching, decreased libido, wheezing, flushing, numbness.    ??? Gabapentin Anaphylaxis   ??? Iodinated Contrast Media Anaphylaxis   ??? Isopropyl Alcohol Anaphylaxis   ??? Latex Anaphylaxis and Rash   ??? Levofloxacin Itching and Anaphylaxis   ??? Lidocaine Anaphylaxis   ??? Nsaids (Non-Steroidal Anti-Inflammatory Drug) Anaphylaxis   ??? Onion Anaphylaxis   ??? 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   ??? Paraben Anaphylaxis   ??? Perfume Anaphylaxis   ??? Pregabalin Anaphylaxis   ??? Preservative Anaphylaxis   ??? Quetiapine Anaphylaxis     Mental Status Change  Other reaction(s): Angioedema   ??? Red Dye Anaphylaxis     Unknown   ??? Sulfa (Sulfonamide Antibiotics) Anaphylaxis   ??? Sulfur Anaphylaxis     Pt states: Any alcohol product   ??? Trazodone Rash and Anaphylaxis   ??? Verapamil Anaphylaxis   ??? Yellow Dye Anaphylaxis     Unknown   ??? Green Dye Rash     Other reaction(s): Contact Dermatitis   ??? Onion Extract Other (See Comments)   ??? Doxepin Other (See Comments)     Sedation and sleep paralysis (side effect)   ??? Duloxetine       Other reaction(s): Other (See Comments)  Mental Status Change   ??? Epi E-Z Pen    ??? Hydroxyzine Itching     Unknown   ??? Statins-Hmg-Coa Reductase Inhibitors    ??? Verapamil (Bulk) Hives     Insomnia, flushing, scattered brain, migraine, itching, anxiety.        Social History:  Social History     Tobacco Use   Smoking Status Never Smoker   Smokeless Tobacco Never Used       Physical Exam:  Vitals:    04/17/20 1321   BP: 141/82   Pulse: 94   SpO2: 92%       General Appearance: well appearing, in no apparent distress  Ears: Pinna normal bilaterally, EAC clear bilaterally, TM clear bilaterally, no perforations noted, hearing grossly intact  Anterior Rhinoscopy: Septum is deviated;  right.  Inferior turbinates with bilateral hypertrophy.  Dry nasal mucosa anteriorly.  Oral cavity: Moist mucous membranes, tongue mobile, palate elevates symmetrically, no visible drainage in the posterior oropharynx      SINUS PROCEDURE NOTE  Procedure: Rigid nasal endoscopy  Pre-procedure diagnosis/Indication for procedure: To evaluate areas not seen on anterior rhinoscopy  Anesthesia: topical 1% phenylephrine & 4% lidocaine   Description: After verbal consent, a 30 degree rigid nasal endoscope was used to examine the left and right nasal cavities. The nasal  valve areas were examined for abnormalities or collapse. The inferior and middle turbinates were evaluated. The middle and superior meatuses, the  sphenoethmoid recesses, and the nasopharynx were examined and inspected for mucopurulence and polyps. The patient tolerated the procedure without complications and was returned to ambulatory status.      Findings:  All structures appeared normal/unremarkable except as noted below    Right septal deviation  Bilateral inferior turbinate hypertrophy  Mild mucosal edema the middle meatus bilaterally  No purulence  Anterior nasal crusting      Impression & Plan:           Possible CRS  Burning tongue  Septal deviation  Mast cell activation  syndrome  Common variable immunodeficiency on IVIG  Nasal crusting    Mupirocin  Doxycycline for  3 weeks   If no improvement may consider nystatin swish and spit  CT scan in 4 weeks  Follow-up after imaging           Number and Complexity of Problems (Level 4)  During this encounter, I addressed 2 or more stable chronic illnesses    Amount and/or Complexity of Data Ordered, Reviewed, or Analyzed (Level 3)  I reviewed 1 external note(s) from: Immunology notes.  I ordered 1 test(s) including:CT scan.        Risk of Complication and/or Morbidity or Mortality of Patient Management (Level  4)  The patient's management entails Moderate risk of complications and/or morbidity or mortality.    Overall LOS:  4        Lorretta Harp, MD  Division of Rhinology, Allergy, and Anterior Skull Base Surgery  Department of Otolaryngology--Head and Neck Surgery  [facility]  [facility]    CC:   Court Joy, MD  Court Joy, MD    **This note was dictated with voice-recognition software**

## 2020-04-24 ENCOUNTER — Ambulatory Visit: Payer: PRIVATE HEALTH INSURANCE

## 2020-04-30 ENCOUNTER — Ambulatory Visit: Payer: PRIVATE HEALTH INSURANCE | Attending: Gastroenterology

## 2020-04-30 NOTE — Unmapped (Signed)
Called patient to let her know we rescheduled her office visit. She can send information via my chart if urgent assistance is needed.   Lvm

## 2020-04-30 NOTE — Unmapped (Signed)
Pt has a telephone appt @ 1045 no one has called her she is leaving at 1230 for a Dental appt please pt

## 2020-04-30 NOTE — Telephone Encounter (Signed)
Patient calling very upset and screaming in the phone stating she has been waiting by the phone for over 2 hours now and no one has called to start her telephone appointment    Patient on appointment desk was scheduled for in office and was marked as no show    Patient states she needs to reschedule to be worked in and cannot wait until June the providers next ava on scheduling end since she has not left her house in 5 years and is not missing a trip to [place] at that time     Requesting a call back to reschedule    Please advise

## 2020-05-01 ENCOUNTER — Ambulatory Visit: Admit: 2020-05-01 | Discharge: 2020-05-01 | Payer: PRIVATE HEALTH INSURANCE

## 2020-05-01 ENCOUNTER — Other Ambulatory Visit: Admit: 2020-05-01 | Payer: PRIVATE HEALTH INSURANCE

## 2020-05-01 DIAGNOSIS — E119 Type 2 diabetes mellitus without complications: Secondary | ICD-10-CM

## 2020-05-01 LAB — RENAL FUNCTION PANEL W/EGFR
Albumin: 4.1 g/dL (ref 3.5–5.7)
Anion Gap: 11 mmol/L (ref 3–16)
BUN: 14 mg/dL (ref 7–25)
CO2: 27 mmol/L (ref 21–33)
Calcium: 9.1 mg/dL (ref 8.6–10.3)
Chloride: 106 mmol/L (ref 98–110)
Creatinine: 0.85 mg/dL (ref 0.60–1.30)
EGFR: 79
Glucose: 83 mg/dL (ref 70–100)
Osmolality, Calculated: 298 mosm/kg (ref 278–305)
Phosphorus: 3.9 mg/dL (ref 2.1–4.7)
Potassium: 4.1 mmol/L (ref 3.5–5.3)
Sodium: 144 mmol/L (ref 133–146)

## 2020-05-01 LAB — LIPID PANEL
Cholesterol, Total: 172 mg/dL (ref 0–200)
HDL: 42 mg/dL — ABNORMAL LOW (ref 60–92)
LDL Cholesterol: 101 mg/dL
Triglycerides: 144 mg/dL (ref 10–149)

## 2020-05-01 LAB — T4, FREE: Free T4: 0.93 ng/dL (ref 0.61–1.76)

## 2020-05-01 LAB — VITAMIN D 25 HYDROXY: Vit D, 25-Hydroxy: 55 ng/mL (ref 30.0–100.0)

## 2020-05-01 LAB — TSH: TSH: 0.91 u[IU]/mL (ref 0.45–4.12)

## 2020-05-01 LAB — PTH, INTACT: PTH: 128 pg/mL — ABNORMAL HIGH (ref 12.0–88.0)

## 2020-05-01 MED ORDER — TRUEPLUS LANCETS 30 gauge Misc
30 | 3 refills | 30.00000 days | Status: AC
Start: 2020-05-01 — End: ?

## 2020-05-01 MED ORDER — ezetimibe (ZETIA) 10 mg tablet
10 | ORAL_TABLET | ORAL | 3 refills | Status: AC
Start: 2020-05-01 — End: 2020-09-24

## 2020-05-01 NOTE — Unmapped (Signed)
lov-04/10/2020

## 2020-05-01 NOTE — Unmapped (Signed)
Last office visit with this provider: 04/03/2020 Orbie Pyo, MD  Last office visit at this location and with whom: 04/03/2020 Orbie Pyo, MD  Next scheduled office visit at this location and with whom: Visit date not found

## 2020-05-01 NOTE — Unmapped (Signed)
[facility]  DIVISION OF OTOLARYNGOLOGY- HEAD & NECK SURGERY      Patient Name: Emily Bonilla  Medical Record Number:  [id number]  Primary Care Physician:  Court Joy, MD   The scribe's documentation has been prepared under my direction and personally reviewed by me in its entirety. I confirm that the note above accurately reflects all work, treatment, procedures, and medical decision making performed by me.       Referring: Dr. Vear Clock    Chief Complaint: Tonsil lesion and palate lesion    HISTORY OF PRESENT ILLNESS  Emily Bonilla is a(n) 60 y.o. female who presents a palatal lesion and tonsil lesion that has been present for some time.  She does not complain of any pain.  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  She is also allergic to multiple drug triggers as well      Patient Active Problem List   Diagnosis   ??? WPW syndrome   ??? Mast cell activation syndrome (CMS Dx)   ??? Multiple allergies   ??? Syncope and collapse   ??? Anxiety and depression   ??? Painful orthopaedic hardware (CMS Dx)   ??? History of asthma   ??? Hypothyroidism due to Hashimoto's thyroiditis   ??? Spells of decreased attentiveness   ??? Seizure (CMS Dx)   ??? Intractable seizures (CMS Dx)   ??? Caries   ??? CVID (common variable immunodeficiency) (CMS Dx)   ??? Type 2 diabetes mellitus without complication, with long-term current use of insulin (CMS Dx)   ??? Hyperthyroidism   ??? Weight gain   ??? Vitamin D deficiency   ??? Hyperparathyroidism (CMS Dx)   ??? Obesity (BMI 30.0-34.9)   ??? Poor venous access   ??? Vertigo, benign positional, unspecified laterality   ??? Tinnitus of both ears   ??? Muscle weakness of lower extremity   ??? Controlled type 2 diabetes mellitus with hyperglycemia, unspecified whether long term insulin use (CMS Dx)     Past Surgical History:   Procedure Laterality Date   ??? BONE MARROW BIOPSY      benign   ??? CARDIAC ELECTROPHYSIOLOGY MAPPING AND ABLATION       attempted x 2, states not successful either time   ??? CHOLECYSTECTOMY  2015   ??? DENTAL EXTRACTION Right 04/25/2017    Procedure: EXTRACTION TOOTH # 31, BRIDGE RESECTIONING;  Surgeon: Gladys Damme, DMD;  Location: [facility] OR;  Service: Oral Surgery;  Laterality: Right;   ??? FACIAL COSMETIC SURGERY     ??? HYSTERECTOMY  2009   ??? INSERTION CATHETER VASCULAR ACCESS N/A 09/06/2017    Procedure: INSERTION POWER PORT A CATH;  Surgeon: Penni Bombard, MD;  Location: [facility] OR;  Service: General;  Laterality: N/A;   ??? LIVER BIOPSY      benign hepatic adednoma per patient   ??? REMOVAL HARDWARE LEG Left 08/17/2016    Procedure: REMOVAL OF HARDWARE LEFT TIBIA;  Surgeon: Andee Lineman, MD;  Location: [facility] OR;  Service: Orthopedics;  Laterality: Left;   ??? TIBIA FRACTURE SURGERY     ??? TONSILLECTOMY      + adenoids     Family History   Problem Relation Age of Onset   ??? Kidney failure Mother    ??? Aneurysm Mother      Social History     Socioeconomic History   ??? Marital status: Divorced     Spouse name: Not on file   ???  Number of children: Not on file   ??? Years of education: Not on file   ??? Highest education level: Not on file   Occupational History   ??? Not on file   Tobacco Use   ??? Smoking status: Never Smoker   ??? Smokeless tobacco: Never Used   Vaping Use   ??? Vaping Use: Not on file   Substance and Sexual Activity   ??? Alcohol use: No     Comment: Denies (11/15/2016)   ??? Drug use: No     Comment: Denies (11/15/2016)   ??? Sexual activity: Not Currently   Other Topics Concern   ??? Caffeine Use Yes     Comment: 1-2 cups daily   ??? Occupational Exposure No   ??? Exercise Yes     Comment: walking and resistance bands daily   ??? Seat Belt Yes   Social History Narrative   ??? Not on file     Social Determinants of Health     Financial Resource Strain: Not on file   Physical Activity: Not on file   Stress: Not on file   Social Connections: Not on file   Housing Stability: Not on file       I have reviewed the past, family and social history that the patient filled  out on 05/01/2020    Pain    Pain Score: Zero (02/20/2020 10:08 AM)       She denies presence of pain.  Pain will be reassessed at next visit.        DRUG/FOOD ALLERGIES: Acetaminophen, Adhesive tape-silicones, Alcohol, Amitriptyline, Aspirin, Benzoic acid, Blue dye, Flecainide, Gabapentin, Iodinated contrast media, Isopropyl alcohol, Latex, Levofloxacin, Lidocaine, Nsaids (non-steroidal anti-inflammatory drug), Onion, Other, Paraben, Perfume, Pregabalin, Preservative, Quetiapine, Red dye, Sulfa (sulfonamide antibiotics), Sulfur, Trazodone, Verapamil, Yellow dye, Green dye, Onion extract, Doxepin, Duloxetine, Epi e-z pen, Hydroxyzine, Statins-hmg-coa reductase inhibitors, and Verapamil (bulk)    CURRENT MEDICATIONS  Prior to Admission medications    Medication Sig Start Date End Date Taking? Authorizing Provider   azelastine (ASTELIN) 137 mcg (0.1 %) nasal spray Use into each nostril. 04/01/20  Yes Historical Provider, MD   EPINEPHrine (EPI-PEN) 0.3 mg/0.3 mL AtIn injection Inject into the muscle. 09/25/19  Yes Historical Provider, MD   blood-glucose meter Misc Use meter to test blood glucose twice daily 06/29/17   Loyal Jacobson, CNP   budesonide (PULMICORT) 0.5 mg/2 mL Take 0.5 mg by mouth.    Historical Provider, MD   busPIRone (BUSPAR) 10 MG tablet Take 1 tablet (10 mg total) by mouth in the morning and at bedtime. Indications: Generalized Anxiety Disorder 04/11/20 07/10/20  Ronn Melena, MD   cetirizine (ZYRTEC) 10 mg Cap Take 10 mg by mouth daily. 10/19/18   Norvel Richards, MD   cholecalciferol, vitamin D3, 125 mcg (5,000 unit) capsule Take 1 capsule (5,000 Units total) by mouth every Monday, Wednesday, and Friday. 10/19/19   Orbie Pyo, MD   clonazePAM (KLONOPIN) 0.5 MG tablet Take 1 tablet (0.5 mg total) by mouth 2 times a day as needed for Anxiety for up to 60 days. 04/30/20 06/29/20  Ronn Melena, MD   cromolyn (GASTROCROM) 100 mg/5 mL solution Take 5 mLs (100 mg total) by mouth 4 times  daily before meals and at bedtime. 01/26/19   Desmond Dike, MD   diphenhydrAMINE (BENADRYL) 25 mg capsule Take 3-6 capsules (75-150 mg total) by mouth daily. 10/19/18   Heepke Laren Everts, MD   doxepin (SINEQUAN) 10 MG  capsule TAKE ONE (1) CAPSULE BY MOUTH AT BEDTIME. 02/22/19   Desmond Dike, MD   doxycycline (MONODOX) 100 MG capsule Take 1 capsule (100 mg total) by mouth 2 times a day. 04/17/20   Lorretta Harp, MD   dulaglutide 4.5 mg/0.5 mL PnIj Inject 4.5 mg subcutaneously every 7 days. 04/03/20 04/03/21  Orbie Pyo, MD   ezetimibe (ZETIA) 10 mg tablet TAKE ONE TABLET BY MOUTH EVERY DAY 05/01/20   Orbie Pyo, MD   famotidine (PEPCID) 40 MG tablet Take 40 mg by mouth 2 times a day.    Historical Provider, MD   fexofenadine (ALLEGRA) 180 MG tablet Take 1 tablet (180 mg total) by mouth daily. 10/19/18   Heepke Laren Everts, MD   fluticasone propionate (FLONASE) 50 mcg/actuation nasal spray Use 1 spray into each nostril daily. 10/19/18   Heepke Laren Everts, MD   furosemide (LASIX) 20 MG tablet Take 1 tablet (20 mg total) by mouth daily. 07/19/19   Court Joy, MD   hydrOXYchloroQUINE (PLAQUENIL) 200 mg tablet Take by mouth daily with breakfast.    Historical Provider, MD   hydrOXYzine HCL (ATARAX) 25 MG tablet Take 1 tablet (25 mg total) by mouth every 8 hours as needed for Anxiety. May take two tablets at bedtime. 07/12/19   Franklyn L. Boothe, MD   levalbuterol (XOPENEX) 1.25 mg/3 mL nebulizer solution Inhale 3 mL (1.25 mg total) by nebulization every 6 hours as needed for Wheezing. 10/19/18   Heepke Laren Everts, MD   levothyroxine (SYNTHROID) 175 MCG tablet Take 1 tablet (175 mcg total) by mouth daily. 10/12/19   Orbie Pyo, MD   methylPREDNISolone (MEDROL DOSEPACK) 4 mg tablet follow package directions 11/23/19   Rene Paci, MD   minoxidiL 5 % Foam Apply to scalp once to twice daily. 02/20/20   Brayton Layman, MD   montelukast (SINGULAIR) 10 mg tablet TAKE ONE TABLET BY MOUTH AT  BEDTIME. 02/26/19   Court Joy, MD   mupirocin (BACTROBAN) 2 % ointment Apply topically 3 times a day. 04/17/20   Lorretta Harp, MD   nebulizers (COMPACT COMPRESSOR NEBULIZER) Misc Please dispense 1 nebulizer with tubing and mouthpiece. 10/19/18   Heepke Laren Everts, MD   omeprazole (PRILOSEC) 40 MG capsule TAKE 1 CAPSULE BY MOUTH 2 TIMES DAILY BEFORE MEALS 10/12/19   Roxy Horseman, CNP   onabotulinumtoxinA (BOTOX COSMETIC) 50 unit SolR Inject into the muscle.    Historical Provider, MD   triamcinolone (KENALOG) 0.1 % ointment APPLY 2 TIMES DAILY 12/27/17   Ilona Dubuske V, DO   TRUE METRIX GLUCOSE TEST STRIP Strp USE TO TEST BLOOD SUGAR 2 TIMES DAILY 03/31/20   Orbie Pyo, MD   TRUEPLUS LANCETS 30 gauge Misc USE TWICE A DAY AS DIRECTED. 05/01/20   Orbie Pyo, MD   ezetimibe (ZETIA) 10 mg tablet Take 1 tablet (10 mg total) by mouth daily. 04/03/20 05/01/20  Orbie Pyo, MD   TRUEPLUS LANCETS 30 gauge Misc USE TWICE A DAY AS DIRECTED. 08/13/19 05/01/20  Orbie Pyo, MD       PHYSICAL EXAM  LMP  (LMP Unknown)      GENERAL: No Acute Distress, Conversant,  No Hoarseness  EYES: EOMI, Anti-icteric  NOSE: No epistaxis, nasal mucosa within normal limits, no purulent drainage  EARS: Normal external canal appearance, EAC patent bilaterally  FACE: 1/6 House-Brackmann Scale, symmetric, sensation equal bilaterally  ORAL CAVITY: No masses or lesions palpated, uvula is midline, moist mucous membranes,  Small  area of her right soft palate that is a prominent salivary gland,.  On her left superior pole of her tonsil there appears to be regrowth of some lymphoid tissue there as well.  No concerns for malignancy.  NECK: Normal range of motion, no thyromegaly, trachea is midline  CHEST: Normal respiratory effort, no retractions, breathing comfortably  SKIN: No rashes, normal appearing skin, no evidence of skin lesions/tumors  PSYCH: Appropriate affect, alert and oriented to person, place and  time  EXTREMITIES: No peripheral edema, normal strength  LUNGS: Normal respiratory effort, no intercostal retractions      PROCEDURE    Flexible Laryngoscopy   Estimated Blood Loss: None    Procedure:   After obtaining consent, the patient was placed in the examination chair in the upright position.  Decongestant and topical anesthetic was sprayed in the After allowing adequate time for hemostatic effect, the flexible 4 mm laryngoscope was passed via the     Nasal Septum: normal;   Nasal Findings: normal;   Nasopharynx: normal   Eustachian Tube: normal   Oropharynx: normal   Base of Tongue: normal   Epiglottis: normal   True Vocal Cord normal   False Vocal Cord: normal   True Vocal Cord Movement: normal   Hypopharynx Mucosa: normal  Arytenoids: normal     * Patient tolerated the procedure well with no complications   * Patient was instructed not to eat for 30 minutes following procedure.   * Patient was instructed that they may notice minor bleeding.    Attestation:   I was present for the entire viewing, including introduction and removal of the scope.                                    ASSESSMENT/PLAN  Lauria is a very pleasant 60 y.o. female with 2 lesions in her oral cavity    1) oral cavity lesions: These lesions are of low suspicion.  I am not concerned about malignancy.  Her endoscopic exam today appeared to be benign.  I am not concerned of any malignancy in the head and neck aerodigestive tract at this time.  Follow-up as needed.    External notes/medical records independently reviewed, labs and imaging independently reviewed, medical management and tests to be discussed/communicated to referring MD.         Number and Complexity of Problems (Level 4)  During this encounter, I addressed 1 undiagnosed new problem with uncertain prognosis    Amount and/or Complexity of Data Ordered, Reviewed, or Analyzed (Level 2)  I reviewed 1 external note(s) from:.        Risk of Complication and/or Morbidity or Mortality  of Patient Management (Level  3)  The patient's management entails Low risk of complications and/or morbidity or mortality.    Overall LOS:  3

## 2020-05-03 MED ORDER — hydrOXYzine HCL (ATARAX) 25 MG tablet
25 | ORAL_TABLET | ORAL | 0 refills | Status: AC
Start: 2020-05-03 — End: 2020-06-19

## 2020-05-07 ENCOUNTER — Ambulatory Visit: Admit: 2020-05-07 | Payer: PRIVATE HEALTH INSURANCE | Attending: Gastroenterology

## 2020-05-07 DIAGNOSIS — K219 Gastro-esophageal reflux disease without esophagitis: Secondary | ICD-10-CM

## 2020-05-07 MED ORDER — famotidine (PEPCID) 40 MG tablet
40 | ORAL_TABLET | Freq: Two times a day (BID) | ORAL | 3 refills | Status: AC
Start: 2020-05-07 — End: ?

## 2020-05-07 MED ORDER — cromolyn (GASTROCROM) 100 mg/5 mL solution
100 | Freq: Four times a day (QID) | ORAL | 11 refills | 24.00000 days | Status: AC
Start: 2020-05-07 — End: ?

## 2020-05-07 MED ORDER — omeprazole (PRILOSEC) 40 MG capsule
40 | ORAL_CAPSULE | Freq: Two times a day (BID) | ORAL | 3 refills | Status: AC
Start: 2020-05-07 — End: 2020-08-05

## 2020-05-07 NOTE — Unmapped (Signed)
GASTROENTEROLOGY AMBULATORY CONSULTATION NOTE    HISTORY OF PRESENT ILLNESS:    The patient is a 60 y.o. female with a history of systemic mastocytosis/mast cell activation syndrome, common variable immunodeficiency syndrome, history of cardiac arrest and anaphylaxis, GERD, status post cholecystectomy, focal hepatic adenoma, fatty liver, chronic diarrhea, has an appointment today for follow-up.  The patient overall is doing well.  She is on omeprazole 40 mg by mouth twice a day and Pepcid 40 mg by mouth twice a day.  She also reports that her diarrhea is better.  She is also using hyoscyamine 0.375 mg by mouth twice a day as needed for abdominal cramps and diarrhea.  She is still complaining from abdominal obesity.  CT scan of the abdomen without contrast did not show any GI abnormality but mostly she had abdominal fat.  She was treated empirically with budesonide for her chronic diarrhea but that helped her diarrhea but increased her bloating and abdominal distention.    She is being evaluated with genetics for inherited genetic diseases that can explain her mast cell disorder.  Denies any rectal bleeding or melena.  She is on Cromolyn 100 mg by mouth 4 times a day.    Follow up visit 10/24/19:  On Prilosec 40 BID and Famotidine 40 mg po BID.  Reflux is controlled.  Rarely has diarrhea but feels is a lot better.  Denies any abdominal pain, nausea or vomiting.    Follow up visit 05/07/20:  The patient has an appointment for follow up and reports that she has been having diarrhea with 4 PM a day. Feels bloated.  Famotidine and Omeprazole are moderately controlling her acid reflux. S    Prior GI workup:  06/2017 had negative stool culture, H.pylori and stool calprotectin.  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  ??  Procedure History:  EGD- over 20+ years ago per patient showing inflammation with  negative eosinophils. No records available.  Colonoscopy- 8+ years ago per patient shows inflammation, was having colonoscopies every year for a while then every 5 years due to CVID and mast cell disorders. No records available.   ??     Past Medical History:   Diagnosis Date   ??? Anaphylaxis    ??? Anemia    ??? Anxiety    ??? Asthma    ??? Atrial fibrillation (CMS Dx)    ??? Bronchitis    ??? Heart disease    ??? Neurological disease    ??? OSA (obstructive sleep apnea)     states resolved   ??? Osteoporosis    ??? Psychogenic nonepileptic seizure    ??? Seizures (CMS Dx)    ??? Spontaneous pneumothorax     states pneumomediatsium/pneumopericardium, self resolved   ??? Thyroid disease    ??? Ulcer    ??? WPW (Wolff-Parkinson-White syndrome)        Past Surgical History:   Procedure Laterality Date   ??? BONE MARROW BIOPSY      benign   ??? CARDIAC ELECTROPHYSIOLOGY MAPPING AND ABLATION      attempted x 2, states not successful either time   ??? CHOLECYSTECTOMY  2015   ??? DENTAL EXTRACTION Right 04/25/2017    Procedure: EXTRACTION TOOTH # 31, BRIDGE RESECTIONING;  Surgeon: Gladys Damme, DMD;  Location: [facility] OR;  Service: Oral Surgery;  Laterality: Right;   ??? FACIAL COSMETIC SURGERY     ???  HYSTERECTOMY  2009   ??? INSERTION CATHETER VASCULAR ACCESS N/A 09/06/2017    Procedure: INSERTION POWER PORT A CATH;  Surgeon: Penni Bombard, MD;  Location: [facility] OR;  Service: General;  Laterality: N/A;   ??? LIVER BIOPSY      benign hepatic adednoma per patient   ??? REMOVAL HARDWARE LEG Left 08/17/2016    Procedure: REMOVAL OF HARDWARE LEFT TIBIA;  Surgeon: Andee Lineman, MD;  Location: [facility] OR;  Service: Orthopedics;  Laterality: Left;   ??? TIBIA FRACTURE SURGERY     ??? TONSILLECTOMY      + adenoids       Family History:   Family History   Problem Relation Age of Onset   ??? Kidney failure Mother    ??? Aneurysm Mother        Social History     Socioeconomic History   ??? Marital status: Divorced     Spouse name: Not on file   ??? Number of children: Not on file   ??? Years of education:  Not on file   ??? Highest education level: Not on file   Occupational History   ??? Not on file   Tobacco Use   ??? Smoking status: Never Smoker   ??? Smokeless tobacco: Never Used   Vaping Use   ??? Vaping Use: Not on file   Substance and Sexual Activity   ??? Alcohol use: No     Comment: Denies (11/15/2016)   ??? Drug use: No     Comment: Denies (11/15/2016)   ??? Sexual activity: Not Currently   Other Topics Concern   ??? Caffeine Use Yes     Comment: 1-2 cups daily   ??? Occupational Exposure No   ??? Exercise Yes     Comment: walking and resistance bands daily   ??? Seat Belt Yes   Social History Narrative   ??? Not on file     Social Determinants of Health     Financial Resource Strain: Not on file   Food Insecurity: Not on file   Physical Activity: Not on file   Stress: Not on file   Social Connections: Not on file   Housing Stability: Not on file       Current Medications:      Current Outpatient Medications:   ???  azelastine (ASTELIN) 137 mcg (0.1 %) nasal spray, Use into each nostril., Disp: , Rfl:   ???  blood-glucose meter Misc, Use meter to test blood glucose twice daily, Disp: 1 each, Rfl: 0  ???  budesonide (PULMICORT) 0.5 mg/2 mL, Take 0.5 mg by mouth., Disp: , Rfl:   ???  busPIRone (BUSPAR) 10 MG tablet, Take 1 tablet (10 mg total) by mouth in the morning and at bedtime. Indications: Generalized Anxiety Disorder, Disp: 180 tablet, Rfl: 0  ???  cetirizine (ZYRTEC) 10 mg Cap, Take 10 mg by mouth daily., Disp: 30 capsule, Rfl: 2  ???  cholecalciferol, vitamin D3, 125 mcg (5,000 unit) capsule, Take 1 capsule (5,000 Units total) by mouth every Monday, Wednesday, and Friday., Disp: 40 capsule, Rfl: 3  ???  clonazePAM (KLONOPIN) 0.5 MG tablet, Take 1 tablet (0.5 mg total) by mouth 2 times a day as needed for Anxiety for up to 60 days., Disp: 60 tablet, Rfl: 1  ???  diphenhydrAMINE (BENADRYL) 25 mg capsule, Take 3-6 capsules (75-150 mg total) by mouth daily., Disp: 90 capsule, Rfl: 2  ???  doxepin (SINEQUAN) 10 MG capsule, TAKE ONE (1) CAPSULE BY  MOUTH AT BEDTIME., Disp: 30 capsule, Rfl: 0  ???  dulaglutide 4.5 mg/0.5 mL PnIj, Inject 4.5 mg subcutaneously every 7 days., Disp: 2 mL, Rfl: 11  ???  EPINEPHrine (EPI-PEN) 0.3 mg/0.3 mL AtIn injection, Inject into the muscle., Disp: , Rfl:   ???  ezetimibe (ZETIA) 10 mg tablet, TAKE ONE TABLET BY MOUTH EVERY DAY, Disp: 30 tablet, Rfl: 3  ???  fexofenadine (ALLEGRA) 180 MG tablet, Take 1 tablet (180 mg total) by mouth daily., Disp: 30 tablet, Rfl: 3  ???  fluticasone propionate (FLONASE) 50 mcg/actuation nasal spray, Use 1 spray into each nostril daily., Disp: 16 g, Rfl: 1  ???  furosemide (LASIX) 20 MG tablet, Take 1 tablet (20 mg total) by mouth daily., Disp: 30 tablet, Rfl: 0  ???  hydrOXYchloroQUINE (PLAQUENIL) 200 mg tablet, Take by mouth daily with breakfast., Disp: , Rfl:   ???  hydrOXYzine HCL (ATARAX) 25 MG tablet, TAKE 1 TABLET BY MOUTH EVERY 8 HOURS AS NEEDED FOR ITCHING. MAY TAKE ADDITIONAL 25MG  AT BEDTIME., Disp: 120 tablet, Rfl: 0  ???  levalbuterol (XOPENEX) 1.25 mg/3 mL nebulizer solution, Inhale 3 mL (1.25 mg total) by nebulization every 6 hours as needed for Wheezing., Disp: 72 mL, Rfl: 0  ???  levothyroxine (SYNTHROID) 175 MCG tablet, Take 1 tablet (175 mcg total) by mouth daily., Disp: 90 tablet, Rfl: 3  ???  montelukast (SINGULAIR) 10 mg tablet, TAKE ONE TABLET BY MOUTH AT BEDTIME., Disp: 30 tablet, Rfl: 0  ???  mupirocin (BACTROBAN) 2 % ointment, Apply topically 3 times a day., Disp: 15 g, Rfl: 3  ???  nebulizers (COMPACT COMPRESSOR NEBULIZER) Misc, Please dispense 1 nebulizer with tubing and mouthpiece., Disp: 1 each, Rfl: 0  ???  onabotulinumtoxinA (BOTOX COSMETIC) 50 unit SolR, Inject into the muscle., Disp: , Rfl:   ???  triamcinolone (KENALOG) 0.1 % ointment, APPLY 2 TIMES DAILY, Disp: 80 g, Rfl: 11  ???  TRUE METRIX GLUCOSE TEST STRIP Strp, USE TO TEST BLOOD SUGAR 2 TIMES DAILY, Disp: 100 strip, Rfl: 3  ???  TRUEPLUS LANCETS 30 gauge Misc, USE TWICE A DAY AS DIRECTED., Disp: 100 each, Rfl: 3  ???  cromolyn (GASTROCROM) 100  mg/5 mL solution, Take 10 mLs (200 mg total) by mouth 4 times daily before meals and at bedtime., Disp: 480 mL, Rfl: 11  ???  famotidine (PEPCID) 40 MG tablet, Take 1 tablet (40 mg total) by mouth 2 times a day., Disp: 180 tablet, Rfl: 3  ???  omeprazole (PRILOSEC) 40 MG capsule, Take 1 capsule (40 mg total) by mouth 2 times a day before meals., Disp: 180 capsule, Rfl: 3    Allergies:  Acetaminophen, Adhesive tape-silicones, Alcohol, Amitriptyline, Aspirin, Benzoic acid, Blue dye, Flecainide, Gabapentin, Iodinated contrast media, Isopropyl alcohol, Latex, Levofloxacin, Lidocaine, Nsaids (non-steroidal anti-inflammatory drug), Onion, Other, Paraben, Perfume, Pregabalin, Preservative, Quetiapine, Red dye, Sulfa (sulfonamide antibiotics), Sulfur, Trazodone, Verapamil, Yellow dye, Green dye, Onion extract, Doxepin, Duloxetine, Epi e-z pen, Hydroxyzine, Statins-hmg-coa reductase inhibitors, and Verapamil (bulk)    Vitals:    LMP  (LMP Unknown)     PHYSICAL EXAM:    Can not be performed over the phone.      DATA:     Lab Results   Component Value Date    WBC 14.6 (H) 11/23/2019    RBC 5.26 (H) 11/23/2019    HGB 13.9 11/23/2019    HCT 43.8 11/23/2019    PLT 378 11/23/2019    MCV 83.4 11/23/2019    MCH  26.5 (L) 11/23/2019    MCHC 31.8 (L) 11/23/2019    RDW 15.9 (H) 11/23/2019    NRBC 0 11/23/2019    LYMPHOPCT 16.1 11/23/2019    MONOPCT 9.7 11/23/2019    EOSPCT 8.6 (H) 11/23/2019    BASOPCT 0.2 11/23/2019    MONOSABS 1,416 (H) 11/23/2019    LYMPHSABS 2,351 11/23/2019    EOSABS 1,256 (H) 11/23/2019    BASOSABS 29 11/23/2019     Lab Results   Component Value Date    NA 144 05/01/2020    K 4.1 05/01/2020    CL 106 05/01/2020    CO2 27 05/01/2020    BUN 14 05/01/2020    CREATININE 0.85 05/01/2020    CALCIUM 9.1 05/01/2020    GLUCOSE 83 05/01/2020     Lab Results   Component Value Date    ALKPHOS 127 (H) 11/23/2019    ALT 28 11/23/2019    AST 31 11/23/2019    PROT 7.7 11/23/2019    BILITOT 0.4 11/23/2019     No results found for:  PROTIME, INR, WARFARIN  No results found for: LABA1C  Lab Results   Component Value Date    TSH 0.91 05/01/2020     Lab Results   Component Value Date    VITAMINB12 589 05/17/2019     No results found for: FOLATE  Lab Results   Component Value Date    IRON 73 06/09/2017     Lab Results   Component Value Date    LABIRON 19.5 06/09/2017     Lab Results   Component Value Date    TIBC 375 06/09/2017     Lab Results   Component Value Date    FERRITIN 46.0 06/09/2017     No results found for: ANATITER, ANAPATTRN1, ANA    IMPRESSION/RECOMMENDATIONS:    1.  Epigastric pain likely due to GERD/gastritis is moderately controlled :  Increased acidity of the stomach and GERD is one of the GI manifestations of systemic mastocytosis.  Continue with omeprazole 40 mg by mouth twice a day and Pepcid 40 mg by mouth twice a day.  I would continue omeprazole and Pepcid at the current doses.  Antireflux lifestyle.    2.  Chronic diarrhea, now recently uncontrolled:  Off hyoscyamine due feeling sick.  This can be potentially related to systemic mastocytosis/mast cell activation syndrome.  I will check C. difficile, enteric pathogens, stool ova and parasites.  I will increase cromolyn from 100 mg p.o. 4 times daily to 200 mg p.o. 3 times daily with meals and at bedtime.  KUB to assess chronic stool burden.    3.   Abdominal obesity:  MRI of the abdomen did not show any intra-abdominal mass/lesion but noted to have fatty liver and 1 cm liver cyst versus hemangioma.  She was counseled about weight loss and dietary changes.  Likely has metabolic syndrome.    RTC in 2 months.  The diagnoses and the plan of care was discussed with the patient.     Electronically signed by Glenna Fellows, MD on 05/07/2020 at 3:54 PM   This was a Phone conversation, in lieu of an in-person visit. The patient provided verbal consent to participate in the telehealth visit.   I spent 15 minutes speaking with the patient, conducting an interview, performing a limited  exam, and educating the patient on my assessment and plan. I also spent 12 minutes, on the same day as the encounter, ordering medications, tests, or procedures,  documenting clinical information in the electronic or other health record and independently interpreting results and communicating results to the patient/family/caregiver.  This note was completely edited, written and reviewed by me and consists of information cut and pasted from the my most recent visit, my smart phrases and other Epic tools. I have personally reviewed all aspects of this note to at least include reviewing this patient's chart and problem list, updating the history, physical exam, lab and procedure results, and assessment and plan as detailed above and below.  As such this visit note reflects my current evaluation and management for this patient.  Answers for HPI/ROS submitted by the patient on 05/02/2020  Decreased Appetite: No  Chills: No  Fatigue : Yes  Fever: Yes  Insomnia: No  Weight loss: No  Blurred vision: No  Double vision: No  Change in color vision: No  Chest Pain: No  Palpitations: Yes  Difficulty Breathing: No  Cough: No  Abdominal pain: Yes  Heart burn: Yes  Nausea: Yes  Vomiting: No  Constipation: No  Diarrhea: Yes  Blood in stool / on toilet paper: No  Black stool: No  Tarry stool: No  Dry Mouth: Yes  Arthritis: Yes  Neck pain: Yes  Muscle aching: Yes  Muscle weakness: Yes  Thyroid disorder: Yes  Excessive thirst: No  Flushing: Yes  Feeling too hot: Yes  Feeling too cold: No  Injection site reaction: No  Hair loss: Yes  Rash: Yes  Itching: Yes  Dry skin: Yes  Increased Sweating: No  Headaches: Yes  Dizziness: Yes  Memory impairment : No  Suicidal thoughts: No  Nervousness / Anxious: Yes  Depression: No  Difficulty concentrating: No  Mood alterations: No  Irritability: No  Abnormal bruising: No  Abnormal bleeding: No  Swollen lymph nodes: Yes

## 2020-05-15 ENCOUNTER — Ambulatory Visit: Payer: PRIVATE HEALTH INSURANCE | Attending: Student in an Organized Health Care Education/Training Program

## 2020-05-16 ENCOUNTER — Inpatient Hospital Stay: Admit: 2020-05-16 | Discharge: 2020-05-20 | Payer: PRIVATE HEALTH INSURANCE | Attending: Gastroenterology

## 2020-05-16 ENCOUNTER — Ambulatory Visit: Admit: 2020-05-16 | Payer: PRIVATE HEALTH INSURANCE | Attending: Family

## 2020-05-16 DIAGNOSIS — K529 Noninfective gastroenteritis and colitis, unspecified: Secondary | ICD-10-CM

## 2020-05-16 DIAGNOSIS — G43719 Chronic migraine without aura, intractable, without status migrainosus: Secondary | ICD-10-CM

## 2020-05-16 MED ORDER — diphenhydrAMINE (BENADRYL) 50 mg/mL injection
50 | INTRAMUSCULAR | Status: AC
Start: 2020-05-16 — End: 2020-05-16
  Administered 2020-05-16: 15:00:00 50 via INTRAMUSCULAR

## 2020-05-16 MED ORDER — sodium chloride 0.9%
INTRAMUSCULAR | Status: AC
Start: 2020-05-16 — End: 2020-05-16

## 2020-05-16 MED ORDER — botulinum toxin A (BOTOX) injection 200 Units
100 | Freq: Once | INTRAMUSCULAR | Status: AC
Start: 2020-05-16 — End: 2020-05-16
  Administered 2020-05-16: 15:00:00 185 [IU] via INTRAMUSCULAR

## 2020-05-16 MED ORDER — botulinum toxin A (BOTOX) 100 unit injection
100 | INTRAMUSCULAR | Status: AC
Start: 2020-05-16 — End: 2020-05-16

## 2020-05-16 MED FILL — DIPHENHYDRAMINE 50 MG/ML INJECTION SOLUTION: 50 50 mg/mL | INTRAMUSCULAR | Qty: 1

## 2020-05-16 MED FILL — SODIUM CHLORIDE 0.9 % INJECTION SOLUTION: INTRAMUSCULAR | Qty: 10

## 2020-05-16 MED FILL — BOTOX 100 UNIT INJECTION: 100 100 unit | INTRAMUSCULAR | Qty: 2

## 2020-05-16 NOTE — Unmapped (Signed)
MRN:  [id number]  Date: 05/16/2020   Emily Bonilla is a 60 y.o. female who comes in for scheduled Botox injection.      BOTOX PREEMPT PROTOCOL     The risks, benefits and anticipated outcomes of the procedure, the risks and benefits of the alternatives to the procedure, and the roles and tasks of the personnel to be involved, were discussed with the patient.     The side effects of onabotulinum toxin used for the treatment of chronic migraine have been described to the patient in detail.  These include: ptosis, neck pain, neck weakness, anaphylaxis, cellulitis, systemic spread (death, respiratory failure, dysarthria, dysphagia, diplopia), blood born illnesses (HIV, hepatitis, mad cow disease).   The patient is aware that any side effects related to muscle paralysis could last for 3 months.     The patient has given written informed consent to the procedure and agrees to proceed.Yes     Treatment # 12   Dilution: 5 units/0.1 ml (200 U of botox was mixed with 4 ccs of bacteriostatic saline.)  Indication: Chronic Migraine        Injection Sites:  0.1 ccs of this solution was injected into the following:  Muscle    Fixed Site/Fixed Dose  L      R   Corrugator??????????????????????????????????????10 Units divided in 2 sites??????????????????????????????????????????  ??  Procerus ??????????????????????????????????????????5 Units in 1 site ????????????????????????????????????????????????????????????????????????  ??  Frontalis????????????????????????????????????????????20 Units divided in 4 sites????????????????????????????????????????????  ??  Temporalis ??????????????????????????????????40 Units divided in 8 sites ????????????????????????????????????????  ??  Occipitalis ??????????????????????????????????????30 Units divided in 6 sites ????????????????????????????????????????  ??  Cervical PSPs??????????????????????????20 Units divided in 4 sites??????????????????????????????????????????  ??  Trapezius????????????????????????????????????????30 Units divided in 6 sites???????????????????????????????????? 30??units extra??in 6 bilateral divided doses  ????????????????????????????????????????????????????????????????????????????????????????????????????????????????????????????????????????????????????????????????????????????????????????????  ???????????????????????????????????????????????????????????????????????????????????????????????????????????????????????????????????????????????????????????????????????????????????? ??????  ??  ??  Total Units used:185  Total Units wasted:15??   Patient did tolerate procedure.     Change in Treatment Plan?no    RTC: 3 Months, For Botox and PRN    Stefan Church, CNP

## 2020-05-22 ENCOUNTER — Inpatient Hospital Stay: Admit: 2020-05-22 | Payer: PRIVATE HEALTH INSURANCE

## 2020-05-22 ENCOUNTER — Other Ambulatory Visit: Admit: 2020-05-22 | Payer: PRIVATE HEALTH INSURANCE

## 2020-05-22 ENCOUNTER — Ambulatory Visit: Admit: 2020-05-22 | Discharge: 2020-05-22 | Payer: PRIVATE HEALTH INSURANCE

## 2020-05-22 DIAGNOSIS — J342 Deviated nasal septum: Secondary | ICD-10-CM

## 2020-05-22 DIAGNOSIS — K529 Noninfective gastroenteritis and colitis, unspecified: Secondary | ICD-10-CM

## 2020-05-22 DIAGNOSIS — J0191 Acute recurrent sinusitis, unspecified: Secondary | ICD-10-CM

## 2020-05-22 LAB — ENTERIC PATHOGEN PANEL
Campylobacter Group (C. ecoli, C. jejuni, C. lari): NOT DETECTED
Norovirus: NOT DETECTED
Rotavirus: NOT DETECTED
Salmonella species: NOT DETECTED
Shiga toxin 1: NOT DETECTED
Shiga toxin 2: NOT DETECTED
Shigella species: NOT DETECTED
Vibrio Group (Vibrio cholerae, Vibrio parahaemolyticus): NOT DETECTED
Yersinia enterocolitica: NOT DETECTED

## 2020-05-22 LAB — OVA AND PARASITE COMPREHENSIVE W/ GIARDIA/CRYPTO

## 2020-05-22 LAB — GIARDIA CRYPTOSPORIDIUM ANTIGENS
Cryptosporidium Ag: NEGATIVE
Giardia Ag: NEGATIVE

## 2020-05-22 MED ORDER — doxycycline (MONODOX) 100 MG capsule
100 | ORAL_CAPSULE | Freq: Two times a day (BID) | ORAL | 1 refills | Status: AC
Start: 2020-05-22 — End: ?

## 2020-05-22 NOTE — Unmapped (Signed)
[facility]  Department of Otolaryngology--Head and Neck Surgery  Division of Rhinology, Allergy, and Anterior Skull Base Surgery  Lorretta Harp, MD  [facility]  [address] [place], [place] [address]  Telephone: [phone]      Pt name: Emily Bonilla  Date: 05/22/2020   MRN: [id number]    Chief Complaint:  Chief Complaint   Patient presents with   ??? CT Results        Referred by: Referral, Self    History Present Illness:  This is a 60 y.o. female who presents with increased sinonasal symptoms and sinus infections over the last 3 months.  She reports he had a sinus infection in January which was associated with facial pressure, thick nasal drainage, otorrhea, sore throat and nasal congestion.  She was given a course of Bactrim for 14 days and 2 separate Medrol Dosepaks with improvement although 1 week off this medication her sinus issues return.  She is currently using saline nasal irrigations, intranasal topical steroid sprays, Astelin nasal spray and oral antihistamines.  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 She has never had sinonasal surgery.  She does have a history of allergy and has been tested before.    Separately she reports a metallic taste in her tongue and the burning sensation of her tongue and palate.  She also reports she is aspirating more than normal.  She follows with Dr. Providence Lanius for these issues.    Interval 05/22/2020   Here for routine follow up.  Reports she feels significantly better after the 3-week course of doxycycline.  No longer having nasal crusting or congestion.  She still has some nasal obstruction which is worse on the right side when she lays down.  This is currently not impacting her quality of life.  Her natural IVIG levels are increasing over time which she is very excited about.  Her tryptase are still high.    Radiology Findings:   CT sinus -05/22/2020  1. ??Clear  paranasal sinuses. No significant mucosal thickening, destructive process, or expansile lesion is visible.   2. ??Leftward nasal septal deviation.    I personally reviewed this imaging today and agree with the above findings.    Past Medical History:  Past Medical History:   Diagnosis Date   ??? Anaphylaxis    ??? Anemia    ??? Anxiety    ??? Asthma    ??? Atrial fibrillation (CMS Dx)    ??? Bronchitis    ??? Heart disease    ??? Neurological disease    ??? OSA (obstructive sleep apnea)     states resolved   ??? Osteoporosis    ??? Psychogenic nonepileptic seizure    ??? Seizures (CMS Dx)    ??? Spontaneous pneumothorax     states pneumomediatsium/pneumopericardium, self resolved   ??? Thyroid disease    ??? Ulcer    ??? WPW (Wolff-Parkinson-White syndrome)        Past Surgical History:  Past Surgical History:   Procedure Laterality Date   ??? BONE MARROW BIOPSY      benign   ??? CARDIAC ELECTROPHYSIOLOGY MAPPING AND ABLATION      attempted x 2, states not successful either time   ??? CHOLECYSTECTOMY  2015   ??? DENTAL EXTRACTION Right 04/25/2017    Procedure: EXTRACTION TOOTH # 31, BRIDGE RESECTIONING;  Surgeon: Gladys Damme, DMD;  Location: [facility] OR;  Service: Oral  Surgery;  Laterality: Right;   ??? FACIAL COSMETIC SURGERY     ??? HYSTERECTOMY  2009   ??? INSERTION CATHETER VASCULAR ACCESS N/A 09/06/2017    Procedure: INSERTION POWER PORT A CATH;  Surgeon: Penni Bombard, MD;  Location: [facility] OR;  Service: General;  Laterality: N/A;   ??? LIVER BIOPSY      benign hepatic adednoma per patient   ??? REMOVAL HARDWARE LEG Left 08/17/2016    Procedure: REMOVAL OF HARDWARE LEFT TIBIA;  Surgeon: Andee Lineman, MD;  Location: [facility] OR;  Service: Orthopedics;  Laterality: Left;   ??? TIBIA FRACTURE SURGERY     ??? TONSILLECTOMY      + adenoids       Medications:  Current Outpatient Medications on File Prior to Visit   Medication Sig Dispense Refill   ??? azelastine (ASTELIN) 137 mcg (0.1 %) nasal spray Use into each nostril.     ??? blood-glucose meter Misc Use meter to test blood glucose  twice daily 1 each 0   ??? budesonide (PULMICORT) 0.5 mg/2 mL Take 0.5 mg by mouth.     ??? busPIRone (BUSPAR) 10 MG tablet Take 1 tablet (10 mg total) by mouth in the morning and at bedtime. Indications: Generalized Anxiety Disorder 180 tablet 0   ??? cetirizine (ZYRTEC) 10 mg Cap Take 10 mg by mouth daily. 30 capsule 2   ??? cholecalciferol, vitamin D3, 125 mcg (5,000 unit) capsule Take 1 capsule (5,000 Units total) by mouth every Monday, Wednesday, and Friday. 40 capsule 3   ??? clonazePAM (KLONOPIN) 0.5 MG tablet Take 1 tablet (0.5 mg total) by mouth 2 times a day as needed for Anxiety for up to 60 days. 60 tablet 1   ??? cromolyn (GASTROCROM) 100 mg/5 mL solution Take 10 mLs (200 mg total) by mouth 4 times daily before meals and at bedtime. 480 mL 11   ??? diphenhydrAMINE (BENADRYL) 25 mg capsule Take 3-6 capsules (75-150 mg total) by mouth daily. 90 capsule 2   ??? doxepin (SINEQUAN) 10 MG capsule TAKE ONE (1) CAPSULE BY MOUTH AT BEDTIME. 30 capsule 0   ??? dulaglutide 4.5 mg/0.5 mL PnIj Inject 4.5 mg subcutaneously every 7 days. 2 mL 11   ??? EPINEPHrine (EPI-PEN) 0.3 mg/0.3 mL AtIn injection Inject into the muscle.     ??? ezetimibe (ZETIA) 10 mg tablet TAKE ONE TABLET BY MOUTH EVERY DAY 30 tablet 3   ??? famotidine (PEPCID) 40 MG tablet Take 1 tablet (40 mg total) by mouth 2 times a day. 180 tablet 3   ??? fexofenadine (ALLEGRA) 180 MG tablet Take 1 tablet (180 mg total) by mouth daily. 30 tablet 3   ??? fluticasone propionate (FLONASE) 50 mcg/actuation nasal spray Use 1 spray into each nostril daily. 16 g 1   ??? furosemide (LASIX) 20 MG tablet Take 1 tablet (20 mg total) by mouth daily. 30 tablet 0   ??? hydrOXYchloroQUINE (PLAQUENIL) 200 mg tablet Take by mouth daily with breakfast.     ??? hydrOXYzine HCL (ATARAX) 25 MG tablet TAKE 1 TABLET BY MOUTH EVERY 8 HOURS AS NEEDED FOR ITCHING. MAY TAKE ADDITIONAL 25MG  AT BEDTIME. 120 tablet 0   ??? levalbuterol (XOPENEX) 1.25 mg/3 mL nebulizer solution Inhale 3 mL (1.25 mg total) by nebulization  every 6 hours as needed for Wheezing. 72 mL 0   ??? levothyroxine (SYNTHROID) 175 MCG tablet Take 1 tablet (175 mcg total) by mouth daily. 90 tablet 3   ??? montelukast (SINGULAIR) 10 mg tablet TAKE  ONE TABLET BY MOUTH AT BEDTIME. 30 tablet 0   ??? mupirocin (BACTROBAN) 2 % ointment Apply topically 3 times a day. 15 g 3   ??? nebulizers (COMPACT COMPRESSOR NEBULIZER) Misc Please dispense 1 nebulizer with tubing and mouthpiece. 1 each 0   ??? omeprazole (PRILOSEC) 40 MG capsule Take 1 capsule (40 mg total) by mouth 2 times a day before meals. 180 capsule 3   ??? onabotulinumtoxinA (BOTOX COSMETIC) 50 unit SolR Inject into the muscle.     ??? triamcinolone (KENALOG) 0.1 % ointment APPLY 2 TIMES DAILY 80 g 11   ??? TRUE METRIX GLUCOSE TEST STRIP Strp USE TO TEST BLOOD SUGAR 2 TIMES DAILY 100 strip 3   ??? TRUEPLUS LANCETS 30 gauge Misc USE TWICE A DAY AS DIRECTED. 100 each 3     No current facility-administered medications on file prior to visit.       Medication Allergies:  Allergies   Allergen Reactions   ??? Acetaminophen Anaphylaxis     Unknown   ??? Adhesive Tape-Silicones Itching, Rash, Swelling and Anaphylaxis     Unknown   ??? Alcohol Anaphylaxis   ??? Amitriptyline Anaphylaxis   ??? Aspirin Anaphylaxis     Unknown   ??? Benzoic Acid Anaphylaxis   ??? Blue Dye Anaphylaxis     Unknown   ??? Flecainide Shortness Of Breath and Anaphylaxis     Itching, decreased libido, wheezing, flushing, numbness.    ??? Gabapentin Anaphylaxis   ??? Iodinated Contrast Media Anaphylaxis   ??? Isopropyl Alcohol Anaphylaxis   ??? Latex Anaphylaxis and Rash   ??? Levofloxacin Itching and Anaphylaxis   ??? Lidocaine Anaphylaxis   ??? Nsaids (Non-Steroidal Anti-Inflammatory Drug) Anaphylaxis   ??? Onion Anaphylaxis   ??? 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  HAS MAST CELL ACTIVATION SYNDROME, SO PATIENT HAS ANAPHYLACTIC REACTIONS IF BODY  RECOGNIZES TRIGGERS  CANNOT TOLERATE ANY DYES, PRESERVATIVES, PARABENS, ETC.   ??? Paraben Anaphylaxis   ??? Perfume Anaphylaxis   ??? Pregabalin Anaphylaxis   ??? Preservative Anaphylaxis   ??? Quetiapine Anaphylaxis     Mental Status Change  Other reaction(s): Angioedema   ??? Red Dye Anaphylaxis     Unknown   ??? Sulfa (Sulfonamide Antibiotics) Anaphylaxis   ??? Sulfur Anaphylaxis     Pt states: Any alcohol product   ??? Trazodone Rash and Anaphylaxis   ??? Verapamil Anaphylaxis   ??? Yellow Dye Anaphylaxis     Unknown   ??? Green Dye Rash     Other reaction(s): Contact Dermatitis   ??? Onion Extract Other (See Comments)   ??? Doxepin Other (See Comments)     Sedation and sleep paralysis (side effect)   ??? Duloxetine      Other reaction(s): Other (See Comments)  Mental Status Change   ??? Epi E-Z Pen    ??? Hydroxyzine Itching     Unknown   ??? Statins-Hmg-Coa Reductase Inhibitors    ??? Verapamil (Bulk) Hives     Insomnia, flushing, scattered brain, migraine, itching, anxiety.        Social History:  Social History     Tobacco Use   Smoking Status Never Smoker   Smokeless Tobacco Never Used       Physical Exam:  Vitals:    05/22/20 0933   BP: 130/75   Pulse: 88       General  Appearance: well appearing, in no apparent distress  Ears: Pinna normal bilaterally, EAC clear bilaterally, TM clear bilaterally, no perforations noted, hearing grossly intact  Anterior Rhinoscopy: Septum is deviated;  right.  Inferior turbinates with bilateral hypertrophy.  Dry nasal mucosa resolved  Oral cavity: Moist mucous membranes, tongue mobile, palate elevates symmetrically, no visible drainage in the posterior oropharynx      SINUS PROCEDURE NOTE -deferred today, exam below from last appointment  Procedure: Rigid nasal endoscopy  Pre-procedure diagnosis/Indication for procedure: To evaluate areas not seen on anterior rhinoscopy  Anesthesia: topical 1% phenylephrine & 4% lidocaine   Description: After verbal consent, a 30 degree rigid nasal endoscope was used to  examine the left and right nasal cavities. The nasal valve areas were examined for abnormalities or collapse. The inferior and middle turbinates were evaluated. The middle and superior meatuses, the  sphenoethmoid recesses, and the nasopharynx were examined and inspected for mucopurulence and polyps. The patient tolerated the procedure without complications and was returned to ambulatory status.      Findings:  All structures appeared normal/unremarkable except as noted below    Right septal deviation  Bilateral inferior turbinate hypertrophy  Mild mucosal edema the middle meatus bilaterally  No purulence  Anterior nasal crusting      Impression & Plan:           Recurrent acute rhinosinusitis with frequent flares secondary to her common variable immune deficiency  Septal deviation  Mast cell activation syndrome  Common variable immunodeficiency on IVIG    Continue Flonase and Astelin nasal spray  Mupirocin  Prescription provided for doxycycline for 3 weeks in the event of her next flare  Follow-up in 4 months or sooner with interval exacerbations           Number and Complexity of Problems (Level 4)  During this encounter, I addressed 2 or more stable chronic illnesses    Amount and/or Complexity of Data Ordered, Reviewed, or Analyzed (Level 4)  I independently interpreted the following test(s): CT scan.        Risk of Complication and/or Morbidity or Mortality of Patient Management (Level  4)  The patient's management entails Moderate risk of complications and/or morbidity or mortality.    Overall LOS:  4        Lorretta Harp, MD  Division of Rhinology, Allergy, and Anterior Skull Base Surgery  Department of Otolaryngology--Head and Neck Surgery  [facility]  [facility]    CC:   Court Joy, MD  Referral, Self    **This note was dictated with voice-recognition software**

## 2020-06-19 ENCOUNTER — Ambulatory Visit
Admit: 2020-06-19 | Discharge: 2020-06-19 | Payer: PRIVATE HEALTH INSURANCE | Attending: Student in an Organized Health Care Education/Training Program

## 2020-06-19 DIAGNOSIS — F411 Generalized anxiety disorder: Secondary | ICD-10-CM

## 2020-06-19 MED ORDER — clonazePAM (KLONOPIN) 0.5 MG tablet
0.5 | ORAL_TABLET | Freq: Two times a day (BID) | ORAL | 0 refills | Status: AC
Start: 2020-06-19 — End: 2020-07-19

## 2020-06-19 MED ORDER — busPIRone (BUSPAR) 10 MG tablet
10 | ORAL_TABLET | Freq: Two times a day (BID) | ORAL | 0 refills | Status: AC
Start: 2020-06-19 — End: 2020-08-19

## 2020-06-19 NOTE — Unmapped (Signed)
[facility]  [facility]  Progress Note     Emily Bonilla  [birth year]  06/19/2020  Face to Face Time: 13:18 - 13:55    This was a Video visit, including two-way audio and video communication, in lieu of an in-person visit. The patient provided verbal consent to participate in the telehealth visit.   I spent 29 minutes speaking with the patient, conducting an interview, performing a limited exam, and educating the patient on my assessment and plan. I also spent 12 minutes, on the same day as the encounter, preparing to see the patient (eg, review of tests), ordering medications, tests, or procedures, referring and communicating with other health care professionals  and documenting clinical information in the electronic or other health record.    Chief Complaint:     Chief Complaint   Patient presents with   ??? Follow-up     Interval History:     Emily Bonilla is a 60 y.o. White or Caucasian female with a history of unspecified anxiety disorder, atrial fibrillation, WPW, idiopathic mast cell activation, anemia, asthma, hypothyroidism, GERD, intermittent chronic HA, PNES who presents to [facility] on 06/19/2020 for follow up. Last seen by me on 04/10/2020 where clonazepam was decreased to 60 tablets (from 65 tabs) and buspirone was also decreased to BID dosing. All other medications kept the same.     Updates:  - Mother passed away recently - describes much family drama during her mother;s passing  - Making summer plans with daughter in [place] - plans to be away until early July   - Requests her clonazepam to scheduled instead of PRN as she will not be able to obtain her medications for her vacation until they are scheduled. Only requests this for 1 month supply.     Anxiety:  - Self increased her buspirone to BID since passing of mother  - Complains of poor sleep since her mother passed away - describes these as sad dreams. Is able to fall asleep right away however.  -  No difficulties with appetite/significant weight gain or loss  - Denies depressive episodes. Denies suicidal/homicidal thinking or behaviors.     Psychiatric Review of Symptoms:     The patient denies neurovegetative symptoms of depression including: -anhedonia, -decreased energy, -decreased sleep, -guilt/low self-esteem, -decreased concentration, -appetitte changes, -sleep changes, -PMR/PMA, -SI.    The patient denies manic symptoms including: -elevated mood, -increased energy, -decreased need for sleep, -rapid speech, -racing thoughts, -impulsivity, -grandiosity, -severe irritability.    The patient denies psychotic symptoms including: -evidence of delusional thinking, -hallucinations.    Changes to Psychiatric/Social/Substance History:     No updates made    Past Medical and Past Surgical History     Past Medical History:   Diagnosis Date   ??? Anaphylaxis    ??? Anemia    ??? Anxiety    ??? Asthma    ??? Atrial fibrillation (CMS Dx)    ??? Bronchitis    ??? Heart disease    ??? Neurological disease    ??? OSA (obstructive sleep apnea)     states resolved   ??? Osteoporosis    ??? Psychogenic nonepileptic seizure    ??? Seizures (CMS Dx)    ??? Spontaneous pneumothorax     states pneumomediatsium/pneumopericardium, self resolved   ??? Thyroid disease    ??? Ulcer    ??? WPW (Wolff-Parkinson-White syndrome)        Past Surgical History:   Procedure Laterality  Date   ??? BONE MARROW BIOPSY      benign   ??? CARDIAC ELECTROPHYSIOLOGY MAPPING AND ABLATION      attempted x 2, states not successful either time   ??? CHOLECYSTECTOMY  2015   ??? DENTAL EXTRACTION Right 04/25/2017    Procedure: EXTRACTION TOOTH # 31, BRIDGE RESECTIONING;  Surgeon: Gladys Damme, DMD;  Location: [facility] OR;  Service: Oral Surgery;  Laterality: Right;   ??? FACIAL COSMETIC SURGERY     ??? HYSTERECTOMY  2009   ??? INSERTION CATHETER VASCULAR ACCESS N/A 09/06/2017    Procedure: INSERTION POWER PORT A CATH;  Surgeon: Penni Bombard, MD;  Location: [facility] OR;  Service: General;  Laterality: N/A;   ???  LIVER BIOPSY      benign hepatic adednoma per patient   ??? REMOVAL HARDWARE LEG Left 08/17/2016    Procedure: REMOVAL OF HARDWARE LEFT TIBIA;  Surgeon: Andee Lineman, MD;  Location: [facility] OR;  Service: Orthopedics;  Laterality: Left;   ??? TIBIA FRACTURE SURGERY     ??? TONSILLECTOMY      + adenoids       Allergies     Allergies   Allergen Reactions   ??? Acetaminophen Anaphylaxis     Unknown   ??? Adhesive Tape-Silicones Itching, Rash, Swelling and Anaphylaxis     Unknown   ??? Alcohol Anaphylaxis   ??? Amitriptyline Anaphylaxis   ??? Aspirin Anaphylaxis     Unknown   ??? Benzoic Acid Anaphylaxis   ??? Blue Dye Anaphylaxis     Unknown   ??? Flecainide Shortness Of Breath and Anaphylaxis     Itching, decreased libido, wheezing, flushing, numbness.    ??? Gabapentin Anaphylaxis   ??? Iodinated Contrast Media Anaphylaxis   ??? Isopropyl Alcohol Anaphylaxis   ??? Latex Anaphylaxis and Rash   ??? Levofloxacin Itching and Anaphylaxis   ??? Lidocaine Anaphylaxis   ??? Nsaids (Non-Steroidal Anti-Inflammatory Drug) Anaphylaxis   ??? Onion Anaphylaxis   ??? 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   ??? Paraben Anaphylaxis   ??? Perfume Anaphylaxis   ??? Pregabalin Anaphylaxis   ??? Preservative Anaphylaxis   ??? Quetiapine Anaphylaxis     Mental Status Change  Other reaction(s): Angioedema   ??? Red Dye Anaphylaxis     Unknown   ??? Sulfa (Sulfonamide Antibiotics) Anaphylaxis   ??? Sulfur Anaphylaxis     Pt states: Any alcohol product   ??? Trazodone Rash and Anaphylaxis   ??? Verapamil Anaphylaxis   ??? Yellow Dye Anaphylaxis     Unknown   ??? Green Dye Rash     Other reaction(s): Contact Dermatitis   ??? Onion Extract Other (See Comments)   ??? Doxepin Other (See Comments)     Sedation and  sleep paralysis (side effect)   ??? Duloxetine      Other reaction(s): Other (See Comments)  Mental Status Change   ??? Epi E-Z Pen    ??? Hydroxyzine Itching     Unknown   ??? Statins-Hmg-Coa Reductase Inhibitors    ??? Verapamil (Bulk) Hives     Insomnia, flushing, scattered brain, migraine, itching, anxiety.      Home Medications:     Current Outpatient Medications   Medication Sig   ???  azelastine Use into each nostril.   ??? blood-glucose meter Use meter to test blood glucose twice daily   ??? budesonide Take 0.5 mg by mouth.   ??? busPIRone Take 1 tablet (10 mg total) by mouth in the morning and at bedtime. Indications: Generalized Anxiety Disorder   ??? ZyrTEC Take 10 mg by mouth daily.   ??? cholecalciferol (vitamin D3) Take 1 capsule (5,000 Units total) by mouth every Monday, Wednesday, and Friday.   ??? clonazePAM Take 1 tablet (0.5 mg total) by mouth 2 times a day as needed for Anxiety for up to 60 days.   ??? cromolyn Take 10 mLs (200 mg total) by mouth 4 times daily before meals and at bedtime.   ??? diphenhydrAMINE Take 3-6 capsules (75-150 mg total) by mouth daily.   ??? doxepin TAKE ONE (1) CAPSULE BY MOUTH AT BEDTIME.   ??? doxycycline Take 1 capsule (100 mg total) by mouth 2 times a day.   ??? dulaglutide Inject 4.5 mg subcutaneously every 7 days.   ??? EPINEPHrine Inject into the muscle.   ??? ezetimibe TAKE ONE TABLET BY MOUTH EVERY DAY   ??? famotidine Take 1 tablet (40 mg total) by mouth 2 times a day.   ??? fexofenadine Take 1 tablet (180 mg total) by mouth daily.   ??? fluticasone propionate Use 1 spray into each nostril daily.   ??? furosemide Take 1 tablet (20 mg total) by mouth daily.   ??? hydrOXYchloroQUINE Take by mouth daily with breakfast.   ??? hydrOXYzine HCL TAKE 1 TABLET BY MOUTH EVERY 8 HOURS AS NEEDED FOR ITCHING. MAY TAKE ADDITIONAL 25MG  AT BEDTIME.   ??? levalbuterol Inhale 3 mL (1.25 mg total) by nebulization every 6 hours as needed for Wheezing.   ??? levothyroxine Take 1 tablet (175 mcg total) by mouth daily.   ??? montelukast  TAKE ONE TABLET BY MOUTH AT BEDTIME.   ??? mupirocin Apply topically 3 times a day.   ??? Compact Compressor Nebulizer Please dispense 1 nebulizer with tubing and mouthpiece.   ??? omeprazole Take 1 capsule (40 mg total) by mouth 2 times a day before meals.   ??? onabotulinumtoxinA (cosmetic) Inject into the muscle.   ??? triamcinolone APPLY 2 TIMES DAILY   ??? True Metrix Glucose Test Strip USE TO TEST BLOOD SUGAR 2 TIMES DAILY   ??? TRUEplus Lancets USE TWICE A DAY AS DIRECTED.     No current facility-administered medications for this visit.       ROS:     Denies headache, nausea, vomiting, blurry vision, chest pain, SOB, abdominal pain, diarrhea, constipation, dysuria, dizziness, new rash    Vitals:     There were no vitals filed for this visit.    Wt Readings from Last 3 Encounters:   05/22/20 220 lb 3.2 oz (99.9 kg)   05/16/20 219 lb (99.3 kg)   05/01/20 219 lb (99.3 kg)     There is no height or weight on file to calculate BMI.    Physical Exam     Constitutional: She is oriented to person, place, and time. She is well-developed, well-nourished, and in no acute distress.    Head: Normocephalic and atraumatic.   Eyes: EOM are normal.   Neck: Normal range of motion.   Pulmonary/Chest: Effort normal. No respiratory distress.   Musculoskeletal: Normal range of motion.   Neurological: She is alert and oriented to person, place, and time.     Mental Status Exam:     Appearance:  Limited due to video visit. Appears stated age, good eye contact, cooperative with interview  Movement: Limited due to video visit, no abnormal movements appreciated    Speech: fluent, with normal tone, volume, and rate  Language: appropriate to interview  Mood/Affect: euthymic/ full range, mood congruent, content appropriate  Thought Process: goal directed, generally linear, no FOI  Associations: no LOA  Thought Content: Denies SI/HI, contracting for safety and making appropriate future plans; no delusions expressed  Perceptions: no evidence of psychotic  thoughts; no AH/VH and not RTIS; denies neurovegetative symptoms  Orientation: alert, oriented to person place and situation  Fund Of Knowledge: grossly normal  Attention and Concentration: normal  Recent and Remote Memory: grossly intact    Insight/Judgment: fairly good/generally good, by recent history    Labs and Imaging:     No results found for this or any previous visit (from the past 336 hour(s)).    Risk Assessment:     Risk Assessment:   Risk factors for violence to self/others: unemployed, co-morbid anxiety, chronic medical illness, chronic pain  ??  Protective factors include: female, no previous suicide attempts, life satisfaction, positive social support, denies hopelessness, no history of trauma, no psychosis, no cognitive dysfunction, no substance use, employed, denies SI/HI, future orientation, goal orientation, contracting for safety, sobriety, collateral information is reassuring, close outpatient follow-up, compliant with recommended medications, no access to guns or weapons  ??  Overall the risk:??low??for future dangerousness; however the patient's imminent risk??is low??as the patient now: denies SI/HI, intent or plan, is clinically sober, future oriented, contracting for safety, has outpatient psychiatric follow-up established, has improved social support. As such, the patient does not meet criteria for admission to inpatient psychiatry. They remain appropriate for an outpatient level of care.    Assessment and Plan:     60 y.o.??Caucasian female, history of unspecified anxiety disorder, atrial fibrillation, WPW, idiopathic mast cell activation, anemia, asthma, hypothyroidism, GERD, intermittent chronic HA, PNES, presents for follow up. Reports increase in anxiety - mostly appropriate and in the setting of grief. Has also self increased buspirone 20 mg BID (previously 10 mg BID) with positive response noted in anxiety. Is making appropriate plans, future and goal oriented - looking forward to an  upcoming vacation with daughter.     In a joint effort to limit sedating medication, pt has self decreased clonazapam to BID PRN and reports well controlled anxiety, no adverse effects from this despite several recent acute stressors. Will continue other medications as prescribed. Remains appropriate for outpatient care.??    #??Unspecified anxiety,??in partial remission:   - CONTINUE clonazepam??0.5mg ??BID??(30 day refill - switched from BID PRN --> PRN to allow for dispense during vacation. Will switch back to PRN for future refills - 60??tabs, 0 refills sent to pharmacy)  ????????????????????????- Continue Buspirone 20 mg BID for anxiety??(60 tabs, 1 refill sent)  ????????????????????????- Continue Hydroxyzine 50mg  QHS for anxiety??(no refills sent)  ????????????????????????- Continue treatment with integrative medicine for biofeedback, massage therapy and acupuncture  ????????????????????????- Self-management: meditation, yoga????????????????????????  - Discussed all prescribed medications including risks/benefits/side effects/alternatives. Patient expressed understanding and agreement with the current treatment plan  - OARRS report run on??06/19/2020. Appropriate and consistent with previous prescriptions.     # Safety:??  - The patient???s imminent risk is low, see risk assessment above. At this time, does not meet criteria for involuntary psychiatric hospitalization and is appropriate for outpatient level of care.  ????????????????????????- Reviewed emergency contact protocol during and after clinic hours  ????????????????????????- Patient expressed  understanding of emergency resources including PES, 911, and the suicide hotline  ??  # Substance use: No active issues  ????????????????????????  # Medical: Continue regular follow up with PCP, sub specialists??  ??  Return to clinic in??3 months  Patient will be discussed in ongoing supervision with [name], MD.    Ronn Melena, MD  PGY-3 Psychiatry  [facility]

## 2020-06-20 MED ORDER — clonazePAM (KLONOPIN) 0.5 MG tablet
0.5 | ORAL_TABLET | Freq: Two times a day (BID) | ORAL | 1 refills | Status: AC | PRN
Start: 2020-06-20 — End: 2020-08-19

## 2020-06-27 MED ORDER — furosemide (LASIX) 20 MG tablet
20 | ORAL_TABLET | Freq: Every day | ORAL | 3 refills | Status: AC
Start: 2020-06-27 — End: ?

## 2020-06-27 NOTE — Unmapped (Signed)
Last visit 02/06/2020 Court Joy, MD  Future Appointments   Date Time Provider Department Center   08/04/2020 10:30 AM Carolynne Edouard, MS [facility]   08/04/2020 11:00 AM Geoffery Spruce [place], [place] [facility]NT [facility]   08/04/2020  3:15 PM Glenna Fellows, MD [facility] GAST [facility]   08/08/2020  2:00 PM Stefan Church, CNP [facility] NEUR [facility]   09/25/2020 10:15 AM Lorretta Harp, MD [facility] ENT [facility]     Requested Prescriptions     Pending Prescriptions Disp Refills   ??? furosemide (LASIX) 20 MG tablet 90 tablet 3     Sig: Take 1 tablet (20 mg total) by mouth daily.       Pain Flowsheet (Urine Drug/OARRS-eKASPER/Narcotic) 03/21/2018 09/14/2018 03/29/2019 07/12/2019 04/11/2020   OARRS/eKASPER Status Reviewed Reviewed Reviewed Reviewed Reviewed   OARRS/eKASPER Consistent with Prescriber Expectation Hazel Sams        Allergies and pharmacy verified.     Forwarding to provider for approval or denial

## 2020-07-28 NOTE — Unmapped (Signed)
Last office visit with this provider: 04/03/2020 Orbie Pyo, MD  Last office visit at this location and with whom: 04/03/2020 Orbie Pyo, MD  Next scheduled office visit at this location and with whom: Visit date not found

## 2020-07-29 MED ORDER — levothyroxine (SYNTHROID) 175 MCG tablet
175 | ORAL_TABLET | ORAL | 3 refills | Status: AC
Start: 2020-07-29 — End: ?

## 2020-08-04 ENCOUNTER — Ambulatory Visit: Admit: 2020-08-04 | Discharge: 2020-08-11 | Payer: PRIVATE HEALTH INSURANCE | Attending: Gastroenterology

## 2020-08-04 ENCOUNTER — Ambulatory Visit: Admit: 2020-08-04 | Discharge: 2020-08-04 | Payer: PRIVATE HEALTH INSURANCE | Attending: Family

## 2020-08-04 ENCOUNTER — Ambulatory Visit: Admit: 2020-08-04 | Discharge: 2020-08-04 | Payer: PRIVATE HEALTH INSURANCE | Attending: Audiologist

## 2020-08-04 DIAGNOSIS — H9313 Tinnitus, bilateral: Secondary | ICD-10-CM

## 2020-08-04 DIAGNOSIS — K529 Noninfective gastroenteritis and colitis, unspecified: Secondary | ICD-10-CM

## 2020-08-04 NOTE — Unmapped (Signed)
Audiogram ordered by Lorre Munroe, CNP     Patient reports worsening of tinnitus both ears; dizziness still present, unchanged    Audio results -   Pure tone thresholds (125-8k Hz) are low normal to mild SNHL (sensorineural hearing loss) both ears   Excellent word recognition score (96%) in quiet on the right; excellent word recognition score (100%) in quiet on the left  Tympanometry shows normal middle ear pressure and compliance (Type A) both ears  Reflexes (ipsilateral and contralateral) are present both ears, elevated contralateral probe right at 1 and 2 kHz  Recommend:  Per paige  See scanned audiogram

## 2020-08-04 NOTE — Unmapped (Signed)
CHIEF COMPLAINT:  Chief Complaint   Patient presents with   ??? Hearing Loss   ??? Tinnitus       HISTORY OF PRESENT ILLNESS:  Emily Bonilla is a 60 y.o.  White or Caucasian female who presents today with increasing hearing loss and tinnitus over the past 14 months. She states when she has a mast cell flare she notices an increase in her hearing loss and tinnitus.  These symptoms have become overwhelming to her every day life worse at night. She denies any aggravating  factors 14 months ago. Non pulsatile. She has tried masking at night.     History of trauma to ear/ head or prior history of ear surgery:No  Hearing loss:Yes  Tinnitus:Yes  Otorrhea:No  Aural fullness:No  Otalgia: No  Headaches/ migraines: Yes  Vision changes: Yes  Family history of hearing loss: No  History of noise exposure:No  Dizziness/Vertigo:No    PAST MEDICAL HISTORY:    has a past medical history of Anaphylaxis, Anemia, Anxiety, Asthma, Atrial fibrillation (CMS Dx), Bronchitis, Heart disease, Neurological disease, OSA (obstructive sleep apnea), Osteoporosis, Psychogenic nonepileptic seizure, Seizures (CMS Dx), Spontaneous pneumothorax, Thyroid disease, Ulcer, and WPW (Wolff-Parkinson-White syndrome).     SURGICAL HISTORY:   Past Surgical History:   Procedure Laterality Date   ??? BONE MARROW BIOPSY      benign   ??? CARDIAC ELECTROPHYSIOLOGY MAPPING AND ABLATION      attempted x 2, states not successful either time   ??? CHOLECYSTECTOMY  2015   ??? DENTAL EXTRACTION Right 04/25/2017    Procedure: EXTRACTION TOOTH # 31, BRIDGE RESECTIONING;  Surgeon: Gladys Damme, DMD;  Location: [facility] OR;  Service: Oral Surgery;  Laterality: Right;   ??? FACIAL COSMETIC SURGERY     ??? HYSTERECTOMY  2009   ??? INSERTION CATHETER VASCULAR ACCESS N/A 09/06/2017    Procedure: INSERTION POWER PORT A CATH;  Surgeon: Penni Bombard, MD;  Location: [facility] OR;  Service: General;  Laterality: N/A;   ??? LIVER BIOPSY      benign hepatic adednoma per patient   ??? REMOVAL HARDWARE LEG Left 08/17/2016     Procedure: REMOVAL OF HARDWARE LEFT TIBIA;  Surgeon: Andee Lineman, MD;  Location: [facility] OR;  Service: Orthopedics;  Laterality: Left;   ??? TIBIA FRACTURE SURGERY     ??? TONSILLECTOMY      + adenoids        FAMILY HISTORY:  family history includes Aneurysm in her mother; Kidney failure in her mother.    SOCIAL HISTORY:   reports that she has never smoked. She has never used smokeless tobacco. She reports that she does not drink alcohol and does not use drugs.    MEDICATIONS:  Outpatient Encounter Medications as of 08/04/2020   Medication Sig Dispense Refill   ??? azelastine (ASTELIN) 137 mcg (0.1 %) nasal spray Use into each nostril.     ??? blood-glucose meter Misc Use meter to test blood glucose twice daily 1 each 0   ??? budesonide (PULMICORT) 0.5 mg/2 mL Take 0.5 mg by mouth.     ??? busPIRone (BUSPAR) 10 MG tablet Take 2 tablets (20 mg total) by mouth in the morning and at bedtime. Indications: Generalized Anxiety Disorder 360 tablet 0   ??? cetirizine (ZYRTEC) 10 mg Cap Take 10 mg by mouth daily. 30 capsule 2   ??? cholecalciferol, vitamin D3, 125 mcg (5,000 unit) capsule Take 1 capsule (5,000 Units total) by mouth every Monday, Wednesday, and Friday. 40 capsule 3   ???  clonazePAM (KLONOPIN) 0.5 MG tablet Take 1 tablet (0.5 mg total) by mouth 2 times a day as needed for Anxiety for up to 60 days. 30 tablet 1   ??? cromolyn (GASTROCROM) 100 mg/5 mL solution Take 10 mLs (200 mg total) by mouth 4 times daily before meals and at bedtime. 480 mL 11   ??? diphenhydrAMINE (BENADRYL) 25 mg capsule Take 3-6 capsules (75-150 mg total) by mouth daily. 90 capsule 2   ??? doxepin (SINEQUAN) 10 MG capsule TAKE ONE (1) CAPSULE BY MOUTH AT BEDTIME. 30 capsule 0   ??? doxycycline (MONODOX) 100 MG capsule Take 1 capsule (100 mg total) by mouth 2 times a day. 30 capsule 1   ??? dulaglutide 4.5 mg/0.5 mL PnIj Inject 4.5 mg subcutaneously every 7 days. 2 mL 11   ??? EPINEPHrine (EPI-PEN) 0.3 mg/0.3 mL AtIn injection Inject into the muscle.     ???  ezetimibe (ZETIA) 10 mg tablet TAKE ONE TABLET BY MOUTH EVERY DAY 30 tablet 3   ??? famotidine (PEPCID) 40 MG tablet Take 1 tablet (40 mg total) by mouth 2 times a day. 180 tablet 3   ??? fexofenadine (ALLEGRA) 180 MG tablet Take 1 tablet (180 mg total) by mouth daily. 30 tablet 3   ??? fluticasone propionate (FLONASE) 50 mcg/actuation nasal spray Use 1 spray into each nostril daily. 16 g 1   ??? furosemide (LASIX) 20 MG tablet Take 1 tablet (20 mg total) by mouth daily. 90 tablet 3   ??? hydrOXYchloroQUINE (PLAQUENIL) 200 mg tablet Take by mouth daily with breakfast.     ??? levalbuterol (XOPENEX) 1.25 mg/3 mL nebulizer solution Inhale 3 mL (1.25 mg total) by nebulization every 6 hours as needed for Wheezing. 72 mL 0   ??? levothyroxine (SYNTHROID) 175 MCG tablet TAKE 1 TABLET BY MOUTH ONCE DAILY. 90 tablet 3   ??? montelukast (SINGULAIR) 10 mg tablet TAKE ONE TABLET BY MOUTH AT BEDTIME. 30 tablet 0   ??? mupirocin (BACTROBAN) 2 % ointment Apply topically 3 times a day. 15 g 3   ??? nebulizers (COMPACT COMPRESSOR NEBULIZER) Misc Please dispense 1 nebulizer with tubing and mouthpiece. 1 each 0   ??? omeprazole (PRILOSEC) 40 MG capsule Take 1 capsule (40 mg total) by mouth 2 times a day before meals. 180 capsule 3   ??? onabotulinumtoxinA (BOTOX COSMETIC) 50 unit SolR Inject into the muscle.     ??? triamcinolone (KENALOG) 0.1 % ointment APPLY 2 TIMES DAILY 80 g 11   ??? TRUE METRIX GLUCOSE TEST STRIP Strp USE TO TEST BLOOD SUGAR 2 TIMES DAILY 100 strip 3   ??? TRUEPLUS LANCETS 30 gauge Misc USE TWICE A DAY AS DIRECTED. 100 each 3   ??? [EXPIRED] clonazePAM (KLONOPIN) 0.5 MG tablet Take 1 tablet (0.5 mg total) by mouth in the morning and at bedtime for 30 days. 60 tablet 0   ??? [DISCONTINUED] levothyroxine (SYNTHROID) 175 MCG tablet Take 1 tablet (175 mcg total) by mouth daily. 90 tablet 3     No facility-administered encounter medications on file as of 08/04/2020.        ALLERGIES:  Acetaminophen, Adhesive tape-silicones, Alcohol, Amitriptyline,  Aspirin, Benzoic acid, Blue dye, Flecainide, Gabapentin, Iodinated contrast media, Isopropyl alcohol, Latex, Levofloxacin, Lidocaine, Nsaids (non-steroidal anti-inflammatory drug), Onion, Other, Paraben, Perfume, Pregabalin, Preservative, Quetiapine, Red dye, Sulfa (sulfonamide antibiotics), Sulfur, Trazodone, Verapamil, Yellow dye, Green dye, Onion extract, Doxepin, Duloxetine, Epi e-z pen, Hydroxyzine, Statins-hmg-coa reductase inhibitors, and Verapamil (bulk)    PHYSICAL EXAM:  Physical  Exam   Constitutional: Pt. is oriented to person, place, and time. Vital signs are normal. Pt appears well-developed and well-nourished.  Head: Normocephalic and atraumatic.   Eyes: Conjunctivae, EOM and lids are normal. Pupils are equal, round, and reactive to light. No foreign bodies found.   Neck: Trachea normal and normal range of motion. Neck supple. Carotid bruit is not present. No mass present.   Pulmonary/Chest: Effort normal. No respiratory distress.   Musculoskeletal: Normal range of motion.   Lymphadenopathy: No cervical adenopathy  Neurological: Alert and oriented to person, place, and time. Normal strength. Coordination and gait normal.   Skin: Skin is warm and dry. No rash noted. No erythema.   Psychiatric: Normal mood and affect. Speech is normal and behavior is normal. Judgment and thought content normal. Cognition and memory are normal.     Otomicroscopy:  Left ear:  EAC normal. TM flat, intact without retraction. Middle ear space is clear.   Right ear:  EAC normal. TM flat, intact without retraction. Middle ear space is clear.     AUDIO: 08/04/2020 AD: SRT 20 WRS 96% AS: SRT 20 WRS 100% (mild decrease since 2020)    VNG:     RADIOLOGY: CT Head 10/2015-Normal brain.   2. Negative for acute??cervical spine fracture. Moderately severe   cervical degenerative disc disease          ASSESSMENT & PLAN:  Emily Bonilla is a 60 y.o.  White or Caucasian female with SNHL and tinnitus. Audiograms, PCP and Endo notes  reviewed  -Noise protection  -HAE  -I told the patient the importance of using masking techniques.  A tinnitus pamphlet was given.  The patient should avoid being in a quiet room.  I also discussed use of antioxidants (MVI).  If the tinnitus worsens, the patient should also consider stress reduction, biofeedback, psychological counseling, use of a tinnitus masker, or tinnitus-retraining therapy.  -Referral to Acupuncture with Crawford Givens   RTC/TH in 3-4 months

## 2020-08-04 NOTE — Unmapped (Signed)
GASTROENTEROLOGY AMBULATORY CONSULTATION NOTE    HISTORY OF PRESENT ILLNESS:    The patient is a 60 y.o. female with a history of systemic mastocytosis/mast cell activation syndrome, common variable immunodeficiency syndrome, history of cardiac arrest and anaphylaxis, GERD, status post cholecystectomy, focal hepatic adenoma, fatty liver, chronic diarrhea, has an appointment today for follow-up.  The patient overall is doing well.  She is on omeprazole 40 mg by mouth twice a day and Pepcid 40 mg by mouth twice a day.  She also reports that her diarrhea is better.  She is also using hyoscyamine 0.375 mg by mouth twice a day as needed for abdominal cramps and diarrhea.  She is still complaining from abdominal obesity.  CT scan of the abdomen without contrast did not show any GI abnormality but mostly she had abdominal fat.  She was treated empirically with budesonide for her chronic diarrhea but that helped her diarrhea but increased her bloating and abdominal distention.    She is being evaluated with genetics for inherited genetic diseases that can explain her mast cell disorder.  Denies any rectal bleeding or melena.  She is on Cromolyn 100 mg by mouth 4 times a day.    Follow up visit 10/24/19:  On Prilosec 40 BID and Famotidine 40 mg po BID.  Reflux is controlled.  Rarely has diarrhea but feels is a lot better.  Denies any abdominal pain, nausea or vomiting.    Follow up visit 05/07/20:  The patient has an appointment for follow up and reports that she has been having diarrhea with 4 PM a day. Feels bloated.  Famotidine and Omeprazole are moderately controlling her acid reflux.     Follow-up visit August 04 2020:  The patient has an appointment for follow-up over the phone.  She reports increasing her cromolyn sodium to 200 mg p.o. 3 times daily has helped a lot her GI symptoms.  She reports that diarrhea has resolved and acid reflux is well controlled.  Denies any abdominal pain, nausea vomiting diarrhea  constipation, rectal bleeding or melena.  She reports that she lost some weight and hoping to lose additional 20 pounds prior to cutting down on omeprazole.    Prior GI workup:  06/2017 had negative stool culture, H.pylori and stool calprotectin.  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  ??  Procedure History:  EGD- over 20+ years ago per patient showing inflammation with negative eosinophils. No records available.  Colonoscopy- 8+ years ago per patient shows inflammation, was having colonoscopies every year for a while then every 5 years due to CVID and mast cell disorders. No records available.   ??     Past Medical History:   Diagnosis Date   ??? Anaphylaxis    ??? Anemia    ??? Anxiety    ??? Asthma    ??? Atrial fibrillation (CMS Dx)    ??? Bronchitis    ??? Heart disease    ??? Neurological disease    ??? OSA (obstructive sleep apnea)     states resolved   ??? Osteoporosis    ??? Psychogenic nonepileptic seizure    ??? Seizures (CMS Dx)    ??? Spontaneous pneumothorax     states pneumomediatsium/pneumopericardium, self resolved   ??? Thyroid disease    ??? Ulcer    ??? WPW (Wolff-Parkinson-White syndrome)        Past Surgical History:  Procedure Laterality Date   ??? BONE MARROW BIOPSY      benign   ??? CARDIAC ELECTROPHYSIOLOGY MAPPING AND ABLATION      attempted x 2, states not successful either time   ??? CHOLECYSTECTOMY  2015   ??? DENTAL EXTRACTION Right 04/25/2017    Procedure: EXTRACTION TOOTH # 31, BRIDGE RESECTIONING;  Surgeon: Gladys Damme, DMD;  Location: [facility] OR;  Service: Oral Surgery;  Laterality: Right;   ??? FACIAL COSMETIC SURGERY     ??? HYSTERECTOMY  2009   ??? INSERTION CATHETER VASCULAR ACCESS N/A 09/06/2017    Procedure: INSERTION POWER PORT A CATH;  Surgeon: Penni Bombard, MD;  Location: [facility] OR;  Service: General;  Laterality: N/A;   ??? LIVER BIOPSY      benign hepatic adednoma per patient   ??? REMOVAL HARDWARE LEG  Left 08/17/2016    Procedure: REMOVAL OF HARDWARE LEFT TIBIA;  Surgeon: Andee Lineman, MD;  Location: [facility] OR;  Service: Orthopedics;  Laterality: Left;   ??? TIBIA FRACTURE SURGERY     ??? TONSILLECTOMY      + adenoids       Family History:   Family History   Problem Relation Age of Onset   ??? Kidney failure Mother    ??? Aneurysm Mother        Social History     Socioeconomic History   ??? Marital status: Divorced     Spouse name: Not on file   ??? Number of children: Not on file   ??? Years of education: Not on file   ??? Highest education level: Not on file   Occupational History   ??? Not on file   Tobacco Use   ??? Smoking status: Never Smoker   ??? Smokeless tobacco: Never Used   Vaping Use   ??? Vaping Use: Not on file   Substance and Sexual Activity   ??? Alcohol use: No     Comment: Denies (11/15/2016)   ??? Drug use: No     Comment: Denies (11/15/2016)   ??? Sexual activity: Not Currently   Other Topics Concern   ??? Caffeine Use Yes     Comment: 1-2 cups daily   ??? Occupational Exposure No   ??? Exercise Yes     Comment: walking and resistance bands daily   ??? Seat Belt Yes   Social History Narrative   ??? Not on file     Social Determinants of Health     Financial Resource Strain: Not on file   Food Insecurity: Not on file   Physical Activity: Not on file   Stress: Not on file   Social Connections: Not on file   Housing Stability: Not on file       Current Medications:      Current Outpatient Medications:   ???  azelastine (ASTELIN) 137 mcg (0.1 %) nasal spray, Use into each nostril., Disp: , Rfl:   ???  blood-glucose meter Misc, Use meter to test blood glucose twice daily, Disp: 1 each, Rfl: 0  ???  budesonide (PULMICORT) 0.5 mg/2 mL, Take 0.5 mg by mouth., Disp: , Rfl:   ???  busPIRone (BUSPAR) 10 MG tablet, Take 2 tablets (20 mg total) by mouth in the morning and at bedtime. Indications: Generalized Anxiety Disorder, Disp: 360 tablet, Rfl: 0  ???  cetirizine (ZYRTEC) 10 mg Cap, Take 10 mg by mouth daily., Disp: 30 capsule, Rfl: 2  ???   cholecalciferol, vitamin D3, 125 mcg (5,000 unit) capsule, Take  1 capsule (5,000 Units total) by mouth every Monday, Wednesday, and Friday., Disp: 40 capsule, Rfl: 3  ???  clonazePAM (KLONOPIN) 0.5 MG tablet, Take 1 tablet (0.5 mg total) by mouth 2 times a day as needed for Anxiety for up to 60 days., Disp: 30 tablet, Rfl: 1  ???  cromolyn (GASTROCROM) 100 mg/5 mL solution, Take 10 mLs (200 mg total) by mouth 4 times daily before meals and at bedtime., Disp: 480 mL, Rfl: 11  ???  diphenhydrAMINE (BENADRYL) 25 mg capsule, Take 3-6 capsules (75-150 mg total) by mouth daily., Disp: 90 capsule, Rfl: 2  ???  doxepin (SINEQUAN) 10 MG capsule, TAKE ONE (1) CAPSULE BY MOUTH AT BEDTIME., Disp: 30 capsule, Rfl: 0  ???  doxycycline (MONODOX) 100 MG capsule, Take 1 capsule (100 mg total) by mouth 2 times a day., Disp: 30 capsule, Rfl: 1  ???  dulaglutide 4.5 mg/0.5 mL PnIj, Inject 4.5 mg subcutaneously every 7 days., Disp: 2 mL, Rfl: 11  ???  EPINEPHrine (EPI-PEN) 0.3 mg/0.3 mL AtIn injection, Inject into the muscle., Disp: , Rfl:   ???  ezetimibe (ZETIA) 10 mg tablet, TAKE ONE TABLET BY MOUTH EVERY DAY, Disp: 30 tablet, Rfl: 3  ???  famotidine (PEPCID) 40 MG tablet, Take 1 tablet (40 mg total) by mouth 2 times a day., Disp: 180 tablet, Rfl: 3  ???  fexofenadine (ALLEGRA) 180 MG tablet, Take 1 tablet (180 mg total) by mouth daily., Disp: 30 tablet, Rfl: 3  ???  fluticasone propionate (FLONASE) 50 mcg/actuation nasal spray, Use 1 spray into each nostril daily., Disp: 16 g, Rfl: 1  ???  furosemide (LASIX) 20 MG tablet, Take 1 tablet (20 mg total) by mouth daily., Disp: 90 tablet, Rfl: 3  ???  hydrOXYchloroQUINE (PLAQUENIL) 200 mg tablet, Take by mouth daily with breakfast., Disp: , Rfl:   ???  levalbuterol (XOPENEX) 1.25 mg/3 mL nebulizer solution, Inhale 3 mL (1.25 mg total) by nebulization every 6 hours as needed for Wheezing., Disp: 72 mL, Rfl: 0  ???  levothyroxine (SYNTHROID) 175 MCG tablet, TAKE 1 TABLET BY MOUTH ONCE DAILY., Disp: 90 tablet, Rfl: 3  ???   montelukast (SINGULAIR) 10 mg tablet, TAKE ONE TABLET BY MOUTH AT BEDTIME., Disp: 30 tablet, Rfl: 0  ???  mupirocin (BACTROBAN) 2 % ointment, Apply topically 3 times a day., Disp: 15 g, Rfl: 3  ???  nebulizers (COMPACT COMPRESSOR NEBULIZER) Misc, Please dispense 1 nebulizer with tubing and mouthpiece., Disp: 1 each, Rfl: 0  ???  omeprazole (PRILOSEC) 40 MG capsule, Take 1 capsule (40 mg total) by mouth 2 times a day before meals., Disp: 180 capsule, Rfl: 3  ???  onabotulinumtoxinA (BOTOX COSMETIC) 50 unit SolR, Inject into the muscle., Disp: , Rfl:   ???  triamcinolone (KENALOG) 0.1 % ointment, APPLY 2 TIMES DAILY, Disp: 80 g, Rfl: 11  ???  TRUE METRIX GLUCOSE TEST STRIP Strp, USE TO TEST BLOOD SUGAR 2 TIMES DAILY, Disp: 100 strip, Rfl: 3  ???  TRUEPLUS LANCETS 30 gauge Misc, USE TWICE A DAY AS DIRECTED., Disp: 100 each, Rfl: 3    Allergies:  Acetaminophen, Adhesive tape-silicones, Alcohol, Amitriptyline, Aspirin, Benzoic acid, Blue dye, Flecainide, Gabapentin, Iodinated contrast media, Isopropyl alcohol, Latex, Levofloxacin, Lidocaine, Nsaids (non-steroidal anti-inflammatory drug), Onion, Other, Paraben, Perfume, Pregabalin, Preservative, Quetiapine, Red dye, Sulfa (sulfonamide antibiotics), Sulfur, Trazodone, Verapamil, Yellow dye, Green dye, Onion extract, Doxepin, Duloxetine, Epi e-z pen, Hydroxyzine, Statins-hmg-coa reductase inhibitors, and Verapamil (bulk)    Vitals:    LMP  (  LMP Unknown)     PHYSICAL EXAM:    Cannot be performed over the phone.      DATA:     Lab Results   Component Value Date    WBC 14.6 (H) 11/23/2019    RBC 5.26 (H) 11/23/2019    HGB 13.9 11/23/2019    HCT 43.8 11/23/2019    PLT 378 11/23/2019    MCV 83.4 11/23/2019    MCH 26.5 (L) 11/23/2019    MCHC 31.8 (L) 11/23/2019    RDW 15.9 (H) 11/23/2019    NRBC 0 11/23/2019    LYMPHOPCT 16.1 11/23/2019    MONOPCT 9.7 11/23/2019    EOSPCT 8.6 (H) 11/23/2019    BASOPCT 0.2 11/23/2019    MONOSABS 1,416 (H) 11/23/2019    LYMPHSABS 2,351 11/23/2019    EOSABS 1,256  (H) 11/23/2019    BASOSABS 29 11/23/2019     Lab Results   Component Value Date    NA 144 05/01/2020    K 4.1 05/01/2020    CL 106 05/01/2020    CO2 27 05/01/2020    BUN 14 05/01/2020    CREATININE 0.85 05/01/2020    CALCIUM 9.1 05/01/2020    GLUCOSE 83 05/01/2020     Lab Results   Component Value Date    ALKPHOS 127 (H) 11/23/2019    ALT 28 11/23/2019    AST 31 11/23/2019    PROT 7.7 11/23/2019    BILITOT 0.4 11/23/2019     No results found for: PROTIME, INR, WARFARIN  No results found for: LABA1C  Lab Results   Component Value Date    TSH 0.91 05/01/2020     Lab Results   Component Value Date    VITAMINB12 589 05/17/2019     No results found for: FOLATE  Lab Results   Component Value Date    IRON 73 06/09/2017     Lab Results   Component Value Date    LABIRON 19.5 06/09/2017     Lab Results   Component Value Date    TIBC 375 06/09/2017     Lab Results   Component Value Date    FERRITIN 46.0 06/09/2017     No results found for: ANATITER, ANAPATTRN1, ANA    IMPRESSION/RECOMMENDATIONS:    1.  Epigastric pain likely due to GERD/gastritis is well controlled :  Increased acidity of the stomach and GERD is one of the GI manifestations of systemic mastocytosis.  Continue with omeprazole 40 mg by mouth twice a day and Pepcid 40 mg by mouth twice a day.   It appears the patient's symptoms of acid reflux improved with increasing cromolyn sodium to 200 mg p.o. 3 times daily.  The patient would like to lose 20 pounds prior to cutting down on omeprazole.  I will plan to taper down omeprazole to once a day only and continue with famotidine 40 mg p.o. twice daily.  Antireflux lifestyle.    2.  Chronic diarrhea, is better controlled now:  Off hyoscyamine due feeling sick.  This can be potentially related to systemic mastocytosis/mast cell activation syndrome.  Stool enteric pathogens, ova and parasites came back negative,  C. difficile was canceled due to formed stool.  Symptoms are better since increasing cromolyn from 100 mg  p.o. 4 times daily to 200 mg p.o. 3 times daily with meals and at bedtime.    3.   Abdominal obesity:  MRI of the abdomen did not show any intra-abdominal mass/lesion but noted to  have fatty liver and 1 cm liver cyst versus hemangioma.  She was counseled about weight loss and dietary changes.  Likely has metabolic syndrome.  The patient lost 10 pounds since April 2022.  She is working on trying to lose additional 20 pounds.    RTC in 3 months.    The diagnoses and the plan of care was discussed with the patient.     Electronically signed by Glenna Fellows, MD on 08/04/2020 at 3:21 PM   This was a Phone conversation, in lieu of an in-person visit. The patient provided verbal consent to participate in the telehealth visit.   I spent 10 minutes speaking with the patient, conducting an interview, performing a limited exam, and educating the patient on my assessment and plan. I also spent 6 minutes, on the same day as the encounter, documenting clinical information in the electronic or other health record.  This note was completely edited, written and reviewed by me and consists of information cut and pasted from the my most recent visit, my smart phrases and other Epic tools. I have personally reviewed all aspects of this note to at least include reviewing this patient's chart and problem list, updating the history, physical exam, lab and procedure results, and assessment and plan as detailed above and below.  As such this visit note reflects my current evaluation and management for this patient.    Answers for HPI/ROS submitted by the patient on 07/28/2020  Decreased Appetite: No  Chills: No  Fatigue : Yes  Fever: No  Insomnia: No  Weight loss: No  Blurred vision: No  Double vision: No  Change in color vision: No  Chest Pain: No  Palpitations: No  Difficulty Breathing: No  Cough: No  Abdominal pain: No  Heart burn: No  Nausea: No  Vomiting: No  Constipation: No  Diarrhea: No  Blood in stool / on toilet paper: No  Black  stool: No  Tarry stool: No  Dry Mouth: No  Arthritis: Yes  Neck pain: Yes  Muscle aching: Yes  Muscle weakness: Yes  Thyroid disorder: Yes  Excessive thirst: No  Flushing: Yes  Feeling too hot: Yes  Feeling too cold: No  Injection site reaction: No  Hair loss: Yes  Rash: Yes  Itching: Yes  Dry skin: No  Increased Sweating: No  Headaches: Yes  Dizziness: No  Memory impairment : Yes  Suicidal thoughts: No  Nervousness / Anxious: Yes  Depression: No  Difficulty concentrating: Yes  Mood alterations: No  Irritability: Yes  Abnormal bruising: No  Abnormal bleeding: No  Swollen lymph nodes: No

## 2020-08-06 NOTE — Unmapped (Signed)
I changed visit to a video visit and routed to Dr Blake Divine to enter lab orders

## 2020-08-06 NOTE — Unmapped (Signed)
Phoned and let Emily Bonilla know that visit has been changed to a video visit and lab orders are in

## 2020-08-06 NOTE — Telephone Encounter (Signed)
Patient requesting next appointment on 9/1 to be via mychart virtual     Also requesting all labs needed to be entered into chart so she can have drawn on 7/26 when she is at hospital for another appointment    Please advise

## 2020-08-08 ENCOUNTER — Ambulatory Visit: Admit: 2020-08-08 | Payer: PRIVATE HEALTH INSURANCE | Attending: Family

## 2020-08-08 DIAGNOSIS — G43909 Migraine, unspecified, not intractable, without status migrainosus: Secondary | ICD-10-CM

## 2020-08-08 MED ORDER — botulinum toxin A (BOTOX) 100 unit injection
100 | INTRAMUSCULAR | Status: AC
Start: 2020-08-08 — End: 2020-08-08

## 2020-08-08 MED ORDER — UNABLE TO FIND
0 refills | Status: AC
Start: 2020-08-08 — End: ?

## 2020-08-08 MED ORDER — botulinum toxin A (BOTOX) injection 200 Units
100 | Freq: Once | INTRAMUSCULAR | Status: AC
Start: 2020-08-08 — End: 2020-08-08
  Administered 2020-08-08: 22:00:00 185 [IU] via INTRAMUSCULAR

## 2020-08-08 MED FILL — BOTOX 100 UNIT INJECTION: 100 100 unit | INTRAMUSCULAR | Qty: 2

## 2020-08-08 NOTE — Unmapped (Signed)
MRN:  [id number]  Date: 08/08/2020   Emily Bonilla is a 60 y.o. female who comes in for scheduled Botox injection.      BOTOX PREEMPT PROTOCOL     The risks, benefits and anticipated outcomes of the procedure, the risks and benefits of the alternatives to the procedure, and the roles and tasks of the personnel to be involved, were discussed with the patient.     The side effects of onabotulinum toxin used for the treatment of chronic migraine have been described to the patient in detail.  These include: ptosis, neck pain, neck weakness, anaphylaxis, cellulitis, systemic spread (death, respiratory failure, dysarthria, dysphagia, diplopia), blood born illnesses (HIV, hepatitis, mad cow disease).   The patient is aware that any side effects related to muscle paralysis could last for 3 months.     The patient has given written informed consent to the procedure and agrees to proceed.Yes     Treatment # 13   Dilution: 5 units/0.1 ml (200 U of botox was mixed with 4 ccs of bacteriostatic saline.)  Indication: Chronic Migraine        Injection Sites:  0.1 ccs of this solution was injected into the following:  Muscle    Fixed Site/Fixed Dose  L      R   Corrugator??????????????????????????????????????10 Units divided in 2 sites??????????????????????????????????????????  ??  Procerus ??????????????????????????????????????????5 Units in 1 site ????????????????????????????????????????????????????????????????????????  ??  Frontalis????????????????????????????????????????????20 Units divided in 4 sites????????????????????????????????????????????  ??  Temporalis ??????????????????????????????????40 Units divided in 8 sites ????????????????????????????????????????  ??  Occipitalis ??????????????????????????????????????30 Units divided in 6 sites ????????????????????????????????????????  ??  Cervical PSPs??????????????????????????20 Units divided in 4 sites??????????????????????????????????????????  ??  Trapezius????????????????????????????????????????30 Units divided in 6 sites???????????????????????????????????? 30??units extra??in 6 bilateral divided doses  ????????????????????????????????????????????????????????????????????????????????????????????????????????????????????????????????????????????????????????????????????????????????????????????  ???????????????????????????????????????????????????????????????????????????????????????????????????????????????????????????????????????????????????????????????????????????????????? ??????  ??  ??  Total Units used:185  Total Units wasted:15??   Patient did tolerate procedure.     Change in Treatment Plan?no    RTC: 3 Months, For Botox and PRN    Stefan Church, CNP

## 2020-08-12 ENCOUNTER — Other Ambulatory Visit: Admit: 2020-08-12 | Payer: PRIVATE HEALTH INSURANCE

## 2020-08-12 ENCOUNTER — Ambulatory Visit: Admit: 2020-08-12 | Discharge: 2020-08-12 | Attending: Audiologist-Hearing Aid Fitter

## 2020-08-12 DIAGNOSIS — E119 Type 2 diabetes mellitus without complications: Secondary | ICD-10-CM

## 2020-08-12 DIAGNOSIS — H9313 Tinnitus, bilateral: Secondary | ICD-10-CM

## 2020-08-12 LAB — VITAMIN D 25 HYDROXY: Vit D, 25-Hydroxy: 61.5 ng/mL (ref 30.0–100.0)

## 2020-08-12 LAB — RENAL FUNCTION PANEL W/EGFR
Albumin: 4.3 g/dL (ref 3.5–5.7)
Anion Gap: 8 mmol/L (ref 3–16)
BUN: 8 mg/dL (ref 7–25)
CO2: 29 mmol/L (ref 21–33)
Calcium: 9.3 mg/dL (ref 8.6–10.3)
Chloride: 106 mmol/L (ref 98–110)
Creatinine: 0.92 mg/dL (ref 0.60–1.30)
EGFR: 72
Glucose: 79 mg/dL (ref 70–100)
Osmolality, Calculated: 293 mosm/kg (ref 278–305)
Phosphorus: 4.1 mg/dL (ref 2.1–4.7)
Potassium: 4.6 mmol/L (ref 3.5–5.3)
Sodium: 143 mmol/L (ref 133–146)

## 2020-08-12 LAB — LIPID PANEL
Cholesterol, Total: 188 mg/dL (ref 0–200)
HDL: 38 mg/dL — ABNORMAL LOW (ref 60–92)
LDL Cholesterol: 109 mg/dL
Triglycerides: 204 mg/dL — ABNORMAL HIGH (ref 10–149)

## 2020-08-12 LAB — PTH, INTACT: PTH: 127 pg/mL — ABNORMAL HIGH (ref 12.0–88.0)

## 2020-08-12 LAB — HEMOGLOBIN A1C: Hemoglobin A1C: 6.1 % — ABNORMAL HIGH (ref 4.0–5.6)

## 2020-08-12 NOTE — Unmapped (Addendum)
heariang aid eval.  Patient has [employer], dual plan.  Patient has many health problems.  Has hearaing loss and tinnitus.  Loss is mild, outside medicaid fitting range.  However, she states that due to health issues that she has many things covered.  I explained that I can write a letter requesting a prior authorization based on her tinnitus.  The tinnitus is causing her anxiety and exacerbating other issues.  TRQ score is 69 which indicates a significant impairment.  If any worse, would recommend psychological intervention.  Will ask PT to sign LMN.  Will attach letter which includes tinnitus survey scores.    If approved will do   widex moment 110R color 115 w/ open domes, medium and size 1 receivers, medium power.

## 2020-08-14 ENCOUNTER — Ambulatory Visit: Payer: PRIVATE HEALTH INSURANCE | Attending: Student in an Organized Health Care Education/Training Program

## 2020-08-19 NOTE — Unmapped (Signed)
Last visit with Provider: 07/03/2019 Genella Mech, MA  Next appointment in this department: 09/11/2020 Nicholaus Bloom, MD

## 2020-08-19 NOTE — Unmapped (Signed)
[place] reviewed 08/19/2020. Consistent with expected

## 2020-08-20 NOTE — Unmapped (Signed)
I see that the [place] was reviewed but the Rx was not refilled. Please refill medication.

## 2020-08-21 MED ORDER — busPIRone (BUSPAR) 10 MG tablet
10 | ORAL_TABLET | Freq: Two times a day (BID) | ORAL | 0 refills | Status: AC
Start: 2020-08-21 — End: 2020-09-11

## 2020-08-21 MED ORDER — clonazePAM (KLONOPIN) 0.5 MG tablet
0.5 | ORAL_TABLET | Freq: Two times a day (BID) | ORAL | 1 refills | Status: AC | PRN
Start: 2020-08-21 — End: 2020-09-11

## 2020-08-21 MED ORDER — clonazePAM (KLONOPIN) 0.5 MG tablet
0.5 | ORAL_TABLET | Freq: Two times a day (BID) | ORAL | 1 refills | Status: AC | PRN
Start: 2020-08-21 — End: 2020-08-21

## 2020-08-21 NOTE — Unmapped (Signed)
Last visit with Provider:06/19/20 with Dr. Erik Obey, 08/14/20 with Dr. Boone Master  Next appointment in this department: 09/11/2020 Emily Bloom, MD      Pharmacy needs a new script for 60 count on the clonazepam

## 2020-08-25 NOTE — Unmapped (Signed)
Spoke with pt , appt made for a physical for Thursday  , pt had not had one if a while, reviewed symptoms with pt for when to go to ED if needed

## 2020-08-28 ENCOUNTER — Observation Stay: Admit: 2020-08-28 | Payer: PRIVATE HEALTH INSURANCE

## 2020-08-28 ENCOUNTER — Ambulatory Visit: Admit: 2020-08-28 | Payer: PRIVATE HEALTH INSURANCE

## 2020-08-28 ENCOUNTER — Observation Stay
Admit: 2020-08-28 | Discharge: 2020-08-29 | Disposition: A | Discharge status: Home / Self Care | Payer: PRIVATE HEALTH INSURANCE | Source: Ambulatory Visit

## 2020-08-28 DIAGNOSIS — E1159 Type 2 diabetes mellitus with other circulatory complications: Secondary | ICD-10-CM

## 2020-08-28 LAB — RENAL FUNCTION PANEL W/EGFR
Albumin: 3.9 g/dL (ref 3.5–5.7)
Albumin: 4 g/dL (ref 3.5–5.7)
Anion Gap: 11 mmol/L (ref 3–16)
Anion Gap: 10 mmol/L (ref 3–16)
BUN: 9 mg/dL (ref 7–25)
BUN: 8 mg/dL (ref 7–25)
CO2: 26 mmol/L (ref 21–33)
CO2: 26 mmol/L (ref 21–33)
Calcium: 8.5 mg/dL (ref 8.6–10.3)
Calcium: 8.4 mg/dL (ref 8.6–10.3)
Chloride: 106 mmol/L (ref 98–110)
Chloride: 108 mmol/L (ref 98–110)
Creatinine: 0.85 mg/dL (ref 0.60–1.30)
Creatinine: 0.84 mg/dL (ref 0.60–1.30)
EGFR: 79
EGFR: 80
Glucose: 76 mg/dL (ref 70–100)
Glucose: 109 mg/dL (ref 70–100)
Osmolality, Calculated: 293 mOsm/kg (ref 278–305)
Osmolality, Calculated: 297 mOsm/kg (ref 278–305)
Phosphorus: 4.1 mg/dL (ref 2.1–4.7)
Phosphorus: 2.7 mg/dL (ref 2.1–4.7)
Potassium: 4.1 mmol/L (ref 3.5–5.3)
Potassium: 3.7 mmol/L (ref 3.5–5.3)
Sodium: 143 mmol/L (ref 133–146)
Sodium: 144 mmol/L (ref 133–146)

## 2020-08-28 LAB — HIGH SENSITIVITY TROPONIN
High Sensitivity Troponin: 3 ng/L (ref 0–14)
High Sensitivity Troponin: 3 ng/L (ref 0–14)

## 2020-08-28 LAB — CBC
Hematocrit: 39.3 % (ref 35.0–45.0)
Hemoglobin: 12.9 g/dL (ref 11.7–15.5)
MCH: 27.2 pg (ref 27.0–33.0)
MCHC: 32.9 g/dL (ref 32.0–36.0)
MCV: 82.5 fL (ref 80.0–100.0)
MPV: 9.2 fL (ref 7.5–11.5)
Platelets: 304 10*3/uL (ref 140–400)
RBC: 4.76 10*6/uL (ref 3.80–5.10)
RDW: 14.2 % (ref 11.0–15.0)
WBC: 11 10*3/uL (ref 3.8–10.8)

## 2020-08-28 LAB — MAGNESIUM
Magnesium: 2 mg/dL (ref 1.5–2.5)
Magnesium: 1.9 mg/dL (ref 1.5–2.5)

## 2020-08-28 LAB — PROTIME-INR
INR: 0.9 (ref 0.9–1.1)
Protime: 12.5 seconds (ref 12.1–15.1)

## 2020-08-28 LAB — POC GLU MONITORING DEVICE: POC Glucose Monitoring Device: 203 mg/dL (ref 70–100)

## 2020-08-28 MED ORDER — potassium chloride (KLOR-CON M20) CR tablet 40 mEq
20 | Freq: Once | ORAL | Status: AC
Start: 2020-08-28 — End: 2020-08-28
  Administered 2020-08-28: 21:00:00 40 meq via ORAL

## 2020-08-28 MED ORDER — EPINEPHrine 1 mg/mL injection
1 | Freq: Once | INTRAMUSCULAR | Status: AC
Start: 2020-08-28 — End: 2020-08-28
  Administered 2020-08-29: 01:00:00 0.3 mg via INTRAMUSCULAR

## 2020-08-28 MED ORDER — EPINEPHrine 1 mg/mL injection
1 | Freq: Once | INTRAMUSCULAR | Status: AC | PRN
Start: 2020-08-28 — End: 2020-08-28

## 2020-08-28 MED ORDER — doxepin (SINEQUAN) capsule 10 mg
10 | Freq: Every evening | ORAL | Status: AC
Start: 2020-08-28 — End: 2020-08-29
  Administered 2020-08-29: 01:00:00 10 mg via ORAL

## 2020-08-28 MED ORDER — NON FORMULARY 40 mg
Freq: Two times a day (BID) | Status: AC
Start: 2020-08-28 — End: 2020-08-28

## 2020-08-28 MED ORDER — omeprazolePRILOSECcapsule40mg
40 | Freq: Two times a day (BID) | ORAL | Status: AC
Start: 2020-08-28 — End: 2020-08-29
  Administered 2020-08-29 (×2): 40 mg via ORAL

## 2020-08-28 MED ORDER — budesonide (PULMICORT) solution 0.5 mg
0.5 | Freq: Two times a day (BID) | ORAL | Status: AC
Start: 2020-08-28 — End: 2020-08-29

## 2020-08-28 MED ORDER — cholecalciferol (vitamin D3) capsule 5,000 Units
125 | ORAL | Status: AC
Start: 2020-08-28 — End: 2020-08-29
  Administered 2020-08-29: 12:00:00 5000 [IU] via ORAL

## 2020-08-28 MED ORDER — diphenhydrAMINEBENADRYLcapsule25mg
25 | Freq: Four times a day (QID) | ORAL | Status: AC | PRN
Start: 2020-08-28 — End: 2020-08-29

## 2020-08-28 MED ORDER — diphenhydrAMINE (BENADRYL) injection 100 mg
50 | Freq: Once | INTRAMUSCULAR | Status: AC
Start: 2020-08-28 — End: 2020-08-28
  Administered 2020-08-28: 15:00:00 50 mg via INTRAVENOUS

## 2020-08-28 MED ORDER — fexofenadine (ALLEGRA) tablet 180 mg
180 | Freq: Every day | ORAL | Status: AC
Start: 2020-08-28 — End: 2020-08-29

## 2020-08-28 MED ORDER — EPINEPHrine 1 mg/mL injection
1 | INTRAMUSCULAR | Status: AC | PRN
Start: 2020-08-28 — End: 2020-08-29

## 2020-08-28 MED ORDER — diphenhydrAMINE (BENADRYL) capsule 100 mg
50 | Freq: Three times a day (TID) | ORAL | Status: AC
Start: 2020-08-28 — End: 2020-08-28

## 2020-08-28 MED ORDER — methylPREDNISolone sod suc(PF) (SOLU-medrol) SolR 125 mg
125 | Freq: Once | INTRAMUSCULAR | Status: AC
Start: 2020-08-28 — End: 2020-08-28
  Administered 2020-08-28: 15:00:00 125 mg via INTRAVENOUS

## 2020-08-28 MED ORDER — hydrOXYchloroQUINE (PLAQUENIL) tablet 400 mg
200 | Freq: Every evening | ORAL | Status: AC
Start: 2020-08-28 — End: 2020-08-29
  Administered 2020-08-29: 01:00:00 400 mg via ORAL

## 2020-08-28 MED ORDER — diphenhydrAMINE (BENADRYL) injection 100 mg
50 | Freq: Once | INTRAMUSCULAR | Status: AC
Start: 2020-08-28 — End: 2020-08-28
  Administered 2020-08-29: 02:00:00 100 mg via INTRAVENOUS

## 2020-08-28 MED ORDER — diphenhydrAMINE (BENADRYL) injection 25 mg
50 | Freq: Four times a day (QID) | INTRAMUSCULAR | Status: AC | PRN
Start: 2020-08-28 — End: 2020-08-29

## 2020-08-28 MED ORDER — omeprazole (PRILOSEC) capsule 40 mg
20 | Freq: Two times a day (BID) | ORAL | Status: AC
Start: 2020-08-28 — End: 2020-08-28

## 2020-08-28 MED ORDER — heparin (porcine) injection 5,000 Units
5000 | Freq: Three times a day (TID) | INTRAMUSCULAR | Status: AC
Start: 2020-08-28 — End: 2020-08-29

## 2020-08-28 MED ORDER — doxycycline (MONODOX) capsule 100 mg
100 | Freq: Two times a day (BID) | ORAL | Status: AC
Start: 2020-08-28 — End: 2020-08-29
  Administered 2020-08-28 – 2020-08-29 (×3): 100 mg via ORAL

## 2020-08-28 MED ORDER — famotidine (PF) (PEPCID) injection 40 mg
20 | Freq: Once | INTRAVENOUS | Status: AC
Start: 2020-08-28 — End: 2020-08-28
  Administered 2020-08-28: 15:00:00 40 mg via INTRAVENOUS

## 2020-08-28 MED ORDER — montelukast (SINGULAIR) tablet 10 mg
10 | Freq: Every evening | ORAL | Status: AC
Start: 2020-08-28 — End: 2020-08-29
  Administered 2020-08-29: 01:00:00 10 mg via ORAL

## 2020-08-28 MED ORDER — hydrOXYzine HCL (ATARAX) tablet 25 mg
25 | Freq: Every evening | ORAL | Status: AC
Start: 2020-08-28 — End: 2020-08-29
  Administered 2020-08-29: 01:00:00 25 mg via ORAL

## 2020-08-28 MED ORDER — ezetimibe (ZETIA) tablet 10 mg
10 | Freq: Every day | ORAL | Status: AC
Start: 2020-08-28 — End: 2020-08-29
  Administered 2020-08-29 (×2): 10 mg via ORAL

## 2020-08-28 MED ORDER — clonazePAM (klonoPIN) tablet 0.5 mg
1 | Freq: Two times a day (BID) | ORAL | Status: AC | PRN
Start: 2020-08-28 — End: 2020-08-29
  Administered 2020-08-29 (×2): 0.5 mg via ORAL

## 2020-08-28 MED ORDER — diphenhydrAMINE (BENADRYL) capsule 50 mg
25 | Freq: Three times a day (TID) | ORAL | Status: AC
Start: 2020-08-28 — End: 2020-08-29
  Administered 2020-08-29 (×2): 50 mg via ORAL

## 2020-08-28 MED ORDER — levothyroxine (SYNTHROID) tablet 175 mcg
175 | Freq: Every day | ORAL | Status: AC
Start: 2020-08-28 — End: 2020-08-29
  Administered 2020-08-29: 12:00:00 175 ug via ORAL

## 2020-08-28 MED ORDER — fluticasone propionate (FLONASE) 50 mcg/actuation nasal spray 1 spray
50 | Freq: Every day | NASAL | Status: AC
Start: 2020-08-28 — End: 2020-08-29
  Administered 2020-08-29: 12:00:00 1 via NASAL

## 2020-08-28 MED ORDER — busPIRone (BUSPAR) tablet 20 mg
10 | Freq: Three times a day (TID) | ORAL | Status: AC
Start: 2020-08-28 — End: 2020-08-29
  Administered 2020-08-28 – 2020-08-29 (×4): 20 mg via ORAL

## 2020-08-28 MED ORDER — furosemide (LASIX) tablet 20 mg
40 | Freq: Every day | ORAL | Status: AC
Start: 2020-08-28 — End: 2020-08-29
  Administered 2020-08-29: 12:00:00 20 mg via ORAL

## 2020-08-28 MED ORDER — diphenhydrAMINE (BENADRYL) injection 50 mg
50 | Freq: Four times a day (QID) | INTRAMUSCULAR | Status: AC | PRN
Start: 2020-08-28 — End: 2020-08-29

## 2020-08-28 MED ORDER — NON FORMULARY 180 mg
Freq: Every evening | Status: AC
Start: 2020-08-28 — End: 2020-08-28

## 2020-08-28 MED ORDER — albuterol (PROVENTIL) nebulizer solution 2.5 mg
2.5 | RESPIRATORY_TRACT | Status: AC | PRN
Start: 2020-08-28 — End: 2020-08-29
  Administered 2020-08-29: 02:00:00 2.5 mg via RESPIRATORY_TRACT

## 2020-08-28 MED ORDER — EPINEPHrine (EPI-PEN) injection AtIn 0.3 mg
0.3 | INTRAMUSCULAR | Status: AC | PRN
Start: 2020-08-28 — End: 2020-08-29

## 2020-08-28 MED ORDER — triamcinolone (KENALOG) 0.1 % ointment
0.1 | TOPICAL | Status: AC | PRN
Start: 2020-08-28 — End: 2020-08-29

## 2020-08-28 MED ORDER — cetirizine (ZYRTEC) tablet 10 mg
10 | Freq: Every day | ORAL | Status: AC
Start: 2020-08-28 — End: 2020-08-29
  Administered 2020-08-29: 12:00:00 10 mg via ORAL

## 2020-08-28 MED ORDER — NON FORMULARY 200 mg
Freq: Four times a day (QID) | Status: AC
Start: 2020-08-28 — End: 2020-08-28

## 2020-08-28 MED ORDER — mupirocin (BACTROBAN) 2 % ointment
2 | Freq: Three times a day (TID) | TOPICAL | Status: AC
Start: 2020-08-28 — End: 2020-08-29
  Administered 2020-08-29 (×3): 1 via TOPICAL

## 2020-08-28 MED ORDER — famotidine (PEPCID) tablet 40 mg
20 | Freq: Two times a day (BID) | ORAL | Status: AC
Start: 2020-08-28 — End: 2020-08-29
  Administered 2020-08-29 (×2): 40 mg via ORAL

## 2020-08-28 MED ORDER — albuterol (PROVENTIL) 90 mcg/actuation inhaler 2 puff
90 | RESPIRATORY_TRACT | Status: AC | PRN
Start: 2020-08-28 — End: 2020-08-29

## 2020-08-28 MED FILL — SOLU-MEDROL (PF) 125 MG/2 ML SOLUTION FOR INJECTION: 125 125 mg/2 mL | INTRAMUSCULAR | Qty: 2

## 2020-08-28 MED FILL — ZETIA 10 MG TABLET: 10 10 mg | ORAL | Qty: 1

## 2020-08-28 MED FILL — FAMOTIDINE 20 MG TABLET: 20 20 MG | ORAL | Qty: 2

## 2020-08-28 MED FILL — ALBUTEROL SULFATE 2.5 MG/3 ML (0.083 %) SOLUTION FOR NEBULIZATION: 2.5 2.5 mg /3 mL (0.083 %) | RESPIRATORY_TRACT | Qty: 3

## 2020-08-28 MED FILL — MUPIROCIN 2 % TOPICAL OINTMENT: 2 2 % | TOPICAL | Qty: 1

## 2020-08-28 MED FILL — DOXEPIN 10 MG CAPSULE: 10 10 MG | ORAL | Qty: 1

## 2020-08-28 MED FILL — OMEPRAZOLE 20 MG CAPSULE,DELAYED RELEASE: 20 20 MG | ORAL | Qty: 2

## 2020-08-28 MED FILL — PULMICORT 0.5 MG/2 ML SUSPENSION FOR NEBULIZATION: 0.5 0.5 mg/2 mL | RESPIRATORY_TRACT | Qty: 2

## 2020-08-28 MED FILL — DOXYCYCLINE MONOHYDRATE 100 MG CAPSULE: 100 100 MG | ORAL | Qty: 1

## 2020-08-28 MED FILL — FAMOTIDINE (PF) 20 MG/2 ML INTRAVENOUS SOLUTION: 20 20 mg/2 mL | INTRAVENOUS | Qty: 4

## 2020-08-28 MED FILL — BUSPIRONE 10 MG TABLET: 10 10 MG | ORAL | Qty: 2

## 2020-08-28 MED FILL — DIPHENHYDRAMINE 25 MG CAPSULE: 25 25 mg | ORAL | Qty: 2

## 2020-08-28 MED FILL — VENTOLIN HFA 90 MCG/ACTUATION AEROSOL INHALER: 90 90 mcg/actuation | RESPIRATORY_TRACT | Qty: 8

## 2020-08-28 MED FILL — ADRENALIN 1 MG/ML (1 ML) INJECTION SOLUTION: 1 1 mg/mL (1 mL) | INTRAMUSCULAR | Qty: 1

## 2020-08-28 MED FILL — OMEPRAZOLE 40 MG CAPSULE,DELAYED RELEASE: 40 40 MG | ORAL | Qty: 1

## 2020-08-28 MED FILL — HYDROXYZINE HCL 25 MG TABLET: 25 25 MG | ORAL | Qty: 1

## 2020-08-28 MED FILL — CETIRIZINE 10 MG TABLET: 10 10 MG | ORAL | Qty: 1

## 2020-08-28 MED FILL — TRIAMCINOLONE ACETONIDE 0.1 % TOPICAL OINTMENT: 0.1 0.1 % | TOPICAL | Qty: 15

## 2020-08-28 MED FILL — SYNTHROID 175 MCG TABLET: 175 175 mcg | ORAL | Qty: 1

## 2020-08-28 MED FILL — DIPHENHYDRAMINE 50 MG CAPSULE: 50 50 MG | ORAL | Qty: 2

## 2020-08-28 MED FILL — CLONAZEPAM 1 MG TABLET: 1 1 MG | ORAL | Qty: 1

## 2020-08-28 MED FILL — HEPARIN (PORCINE) 5,000 UNIT/ML INJECTION SOLUTION: 5000 5,000 unit/mL | INTRAMUSCULAR | Qty: 1

## 2020-08-28 MED FILL — HYDROXYCHLOROQUINE 200 MG TABLET: 200 200 mg | ORAL | Qty: 2

## 2020-08-28 MED FILL — DIPHENHYDRAMINE 50 MG/ML INJECTION SOLUTION: 50 50 mg/mL | INTRAMUSCULAR | Qty: 2

## 2020-08-28 MED FILL — MONTELUKAST 10 MG TABLET: 10 10 mg | ORAL | Qty: 1

## 2020-08-28 MED FILL — KLOR-CON M20 MEQ TABLET,EXTENDED RELEASE: 20 20 mEq | ORAL | Qty: 2

## 2020-08-28 MED FILL — CHOLECALCIFEROL (VITAMIN D3) 125 MCG (5,000 UNIT) CAPSULE: 125 125 mcg (5,000 unit) | ORAL | Qty: 1

## 2020-08-28 MED FILL — FLUTICASONE PROPIONATE 50 MCG/ACTUATION NASAL SPRAY,SUSPENSION: 50 50 mcg/actuation | NASAL | Qty: 16

## 2020-08-28 MED FILL — FUROSEMIDE 40 MG TABLET: 40 40 MG | ORAL | Qty: 1

## 2020-08-28 NOTE — Unmapped (Signed)
Problem: Glucose Imbalance related to diabetes disease process  Goal: Clinical indication of glucose balance is achieved  Outcome: Progressing  Goal: Patient's discharge needs are met  Outcome: Progressing     Problem: Knowledge deficit related to self-management of chronic disease  Goal: Patient/family/caregiver demonstrates understanding of disease process, treatment plan, medications, and discharge instructions  Outcome: Progressing     Problem: Potential for imbalanced nutrition related to metabolic effect of diabetes  Goal: Patient's nutritional needs will be met  Outcome: Progressing     Problem: Risk for ineffective therapeutic regimen management related to insulin pump  Goal: Blood glucose is within target range  Outcome: Progressing     Problem: Hemodynamic Status  Goal: Patient's vitals signs are stable  Description: Assess and monitor patient's heart rate, rhythm, respiratory rate, peripheral pulses, capillary refill, color, body temperature, intake and output, labs and physical activity tolerance.   Observe for signs of chest pain (note location, duration, severity, radiation and associated symptoms such as diaphoresis, nausea, indigestion).  Monitor for signs and symptoms of heart failure (eg. shortness of breath, edema of feet/ankles/legs, rapid irregular heart rate, coughing, wheezing, white/pink blood tinged sputum, sudden weight gain, chest pain). Collaborate with interdisciplinary team and initiate plan and interventions as ordered.  Outcome: Progressing

## 2020-08-28 NOTE — Unmapped (Signed)
Internal Medicine - Cross Cover Short Note:    I received a call from Nursing staff on patient: Makaiah Terwilliger.   - Pt reports that housekeeping who had strong perfume triggered her into anaphylaxis w/ airway obstruction and wheezing. Reported she felt her throat close up. Pt had at that time received 1 epi pen that RN had provided. Rapid Response was called and pharmacy provided 2nd epi pen. She had albuterol neb improving her wheezing. During this time her vitals were: BP soft 130s/33, fluids were started however then she reports that the cold fluids triggered her into a third anaphylaxis, another 3rd epi pen was delivered with resolution of symptoms. Pt will be monitored in the CVR. If another anaphylaxis episodes occurs, will move to high level of care. MICU fellow and nursing supervisor aware.     - ENT called to assess   - VBG demonstrating resp alk   - trop <3   - EKG looks stable    Vanetta Shawl, MD  08/28/2020, 10:44 PM  [facility] Internal Medicine Team

## 2020-08-28 NOTE — Unmapped (Signed)
[facility]  Spiritual Care  Clinical Encounter    PATIENT NAME: Emily Bonilla  ROOM:CVR04/UCVR-04    Religious Affiliation:Greek Orthodox     Referral From: Page  Visit : Initial  Visited With: Patient  Visit Type: Rapid Response  Care Provided: Active Listening, Prayer              Follow-up Needed: No  available upon request at any time. Pt expressed the helpfulness of prayer in her life; please contact us at any time if it is felt that pt would benefit from a visit.    Those present were educated on chaplain services and availability.    Please page our service at [phone] as needs arise for patient and/or family.

## 2020-08-28 NOTE — Unmapped (Signed)
Department of Internal Medicine   AOD Rapid Response Note    Patient: Kenita Bines  MRN: [id number]  Room: CVR02/UCVR-02  Attending: Rickey Primus, DO  Primary Team: [facility] MED - [facility] TEAM     Event Narrative     Rapid response was called on Everette Parcher at approximately 9:35PM. Patient admitted for ANGINA.  Response was called due to concern for anaphylactic event & respiratory distress  Patient's vitals were notable for:   BP: 110/33-->129/70  HR: ~115  RR: 30s  Spo2 of 96 on RA    Brief Exam  Constitutional:  Alert & conversing with examiner- in severe distress; appears SOB  HEENT: Conjunctivae normal. Non icteric; normocephalic, atraumatic  Cardiovascular: Tachycardic, regular rhthym;  no murmurs, rubs or gallops   Pulmonary: B/l breath sounds ausculted; No wheezing or rales; on RA; in respiratory distress  Abdominal: Abdomen is soft, non tender, non distended   Skin: skin is warm and dry. No diaphoresis  Neurological: No focal deficit present. CN II-XII grossly intact. Able to move all extremities b/l; sensation intact in all extremities        Assessment & Plan     #Dyspnea  #?Idiopathic anaphylaxis episode  Pt had significant respiratory distress. 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 She self adminstered personal epinephrine ~10 mins prior to rapid response with some resolution in sx. However upon arrival, she was in significant distress, which required additional 0.3mg  epinephrine. She also received albuterol treatment. Her sx improve and she was able to converse well. However, a few minutes later she then rebounded and received additional epinephrine at 9:56 PM. Her sx improved after that and she remained stable.    Interventions:   - Two 0.3mg  IM epinephrine was given with improvement.  - bolus IVF  - RT gave abluterol nuberulizer  - IV benadryl    The following were ordered:   - Labs: VBG with metabolic alkalosis--not concerned for retention.  -  EKG: stable    Disposition & Plan  Discussed with MICU fellow and nursing supervisor.  - Remain in CVR. Room was changed as she reports her sx were triggered by perfume.  - If she has additional event, will transfer to [facility] for closer evaluation  - If future IVF are needed, recommend warmed fluids as she reports she is sometimes triggered by cold fluids.

## 2020-08-28 NOTE — Unmapped (Signed)
Department of Internal Medicine  History & Physical    Patient: Emily Bonilla  MRN: [id number]    Chief Complaint     Dyspnea on exertion and chest pressure    History of Present Illness     Emily Bonilla is a 60 y.o. 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 Alpha Gula with chronic onset exertional dyspnea with concern for angina.    She began experiencing dyspnea on exertion in March 2022 when she began regularly exercising by walking up flights of stairs or, when she couldn't tolerate that, walking on non-inclined surfaces. This dyspnea would cause her to have to stop after 1 flight of stairs or approximately 50 yards due to inability to catch her breath or severe substernal chest pressure that would at times radiate to her left arm. She has noticed occasional episodes of this dyspnea and chest pressure at rest. Over the past 3 weeks has had multiple episodes of waking up gasping for air, not relieved by changing her position. Occasionally experiences palpitations in setting of known WPW with reported unsuccessful ablation earlier in her life and paroxsymal atrial fibrillation. She initially attributed her shortness of breath to deconditioning since she just began exercising again in March however was concerned when over time when she would continue exercising without symptomatic improvement.    She was seen at her primary care physician's office earlier today for a wellness visit that was converted to an acute visit for chest pain and dyspnea on exertion. Due to extensive allergy history an EKG was not able to be  obtained with concern for severe allergy to the office's EKG leads. She was directly admitted to [facility] for further evaluation and cardiac work-up.     Of note, remote history of WPW diagnosis with unsuccessful ablation x2. No prior stress tests available through EMR/care everywhere. She reports undergoing left heart catheterization in the past, unclear of result, denies stent placement.  Reports previous stress tests inconclusive due to limited by comorbid allergy and hypersensitivity reactions, unable to view any stress test reports in EMR. Previously had seen a cardiologist at [facility] in 2017 for paroxysmal atrial fibrillation and recent syncope at the time. Not placed on anticoagulation at this time due to CHADSVASC score of 1. Last TTE on record on 07/01/2015 with EF of 65%, normal left atrium, trace mitral regurgitation and grade II diastolic dysfunction. Syncope determined to be due to noxious stimuli.  Prior EKGs with prolonged QT interval. Reported history of cardiac arrest in the midst of an anaphylactic reaction, unable to find documentation regarding this event. Has a significant family history of early cardiac death in her materal grandfather and maternal uncle and severe cardiovascular disease in her brother. Does not smoke tobacco, consume alcohol or use recreational substances.    Additionally she follows with Dr. Grover Canavan at [facility] for her mast cell activation syndrome, chronic urticaria and idiopathic anaphylaxis. She is on a home regimen that has been providing relief in the recent months. Allergies are extensive per patient and EMR with listing including latex, tape adhesive, alcohol swabs and alcohol based products (hand sanitizer, perfumes, etc.), EKG leads, aspirin, IV contrast, and numerous medications as listed.  Review of Systems     Review of Systems   Constitutional: Positive for malaise/fatigue. Negative for chills, diaphoresis, fever and weight loss.   HENT: Positive for  congestion and tinnitus (Chronic, unchanged). Negative for hearing loss, nosebleeds and sore throat.    Eyes: Negative for blurred vision, double vision and redness.   Respiratory: Positive for shortness of breath (With exertion). Negative for cough, sputum production, wheezing and stridor.    Cardiovascular: Positive for chest pain (March 2022 onset, with exertion, described as pressure), palpitations and PND. Negative for orthopnea and leg swelling.   Gastrointestinal: Negative for abdominal pain, constipation, diarrhea, heartburn, nausea and vomiting.   Genitourinary: Negative for dysuria, frequency and urgency.   Musculoskeletal: Negative for back pain, falls, joint pain, myalgias and neck pain.   Skin: Positive for itching and rash.        Chronic, currently controlled on outpatient regimen   Neurological: Negative for dizziness, tingling, tremors, loss of consciousness, weakness and headaches.   Psychiatric/Behavioral: Positive for depression (Chronic, stable). Negative for substance abuse. The patient is nervous/anxious (Chronic, more anxious recently).      Past Medical and Social History     Past Medical History:   Diagnosis Date   ??? Anaphylaxis    ??? Anemia    ??? Anxiety    ??? Asthma    ??? Atrial fibrillation (CMS Dx)    ??? Bronchitis    ??? Heart disease    ??? Neurological disease    ??? OSA (obstructive sleep apnea)     states resolved   ??? Osteoporosis    ??? Psychogenic nonepileptic seizure    ??? Seizures (CMS Dx)    ??? Spontaneous pneumothorax     states pneumomediatsium/pneumopericardium, self resolved   ??? Thyroid disease    ??? Ulcer    ??? WPW (Wolff-Parkinson-White syndrome)      Past Surgical History:   Procedure Laterality Date   ??? BONE MARROW BIOPSY      benign   ??? CARDIAC ELECTROPHYSIOLOGY MAPPING AND ABLATION      attempted x 2, states not successful either time   ??? CHOLECYSTECTOMY  2015   ??? DENTAL EXTRACTION Right 04/25/2017    Procedure: EXTRACTION TOOTH # 31, BRIDGE RESECTIONING;  Surgeon: Gladys Damme, DMD;   Location: [facility] OR;  Service: Oral Surgery;  Laterality: Right;   ??? FACIAL COSMETIC SURGERY     ??? HYSTERECTOMY  2009   ??? INSERTION CATHETER VASCULAR ACCESS N/A 09/06/2017    Procedure: INSERTION POWER PORT A CATH;  Surgeon: Penni Bombard, MD;  Location: [facility] OR;  Service: General;  Laterality: N/A;   ??? LIVER BIOPSY      benign hepatic adednoma per patient   ??? REMOVAL HARDWARE LEG Left 08/17/2016    Procedure: REMOVAL OF HARDWARE LEFT TIBIA;  Surgeon: Andee Lineman, MD;  Location: [facility] OR;  Service: Orthopedics;  Laterality: Left;   ??? TIBIA FRACTURE SURGERY     ??? TONSILLECTOMY      + adenoids     Family History   Problem Relation Age of Onset   ??? Kidney failure Mother    ??? Aneurysm Mother      Social History     Tobacco Use   ??? Smoking status: Never Smoker   ??? Smokeless tobacco: Never Used   Vaping Use   ??? Vaping Use: Never used   Substance Use Topics   ??? Alcohol use: No   ??? Drug use: No  Home Medications     Prior to Admission medications as of 08/28/20 1509   Medication Sig Taking?   azelastine (ASTELIN) 137 mcg (0.1 %) nasal spray Use into each nostril. Yes   budesonide (PULMICORT) 0.5 mg/2 mL Take 0.5 mg by mouth. Yes   busPIRone (BUSPAR) 10 MG tablet Take 2 tablets (20 mg total) by mouth in the morning and at bedtime. Indications: Generalized Anxiety Disorder Yes   cetirizine (ZYRTEC) 10 mg Cap Take 10 mg by mouth daily. Yes   cholecalciferol, vitamin D3, 125 mcg (5,000 unit) capsule Take 1 capsule (5,000 Units total) by mouth every Monday, Wednesday, and Friday. Yes   clonazePAM (KLONOPIN) 0.5 MG tablet Take 1 tablet (0.5 mg total) by mouth 2 times a day as needed for Anxiety for up to 60 days. Yes   cromolyn (GASTROCROM) 100 mg/5 mL solution Take 10 mLs (200 mg total) by mouth 4 times daily before meals and at bedtime. Yes   diphenhydrAMINE (BENADRYL) 25 mg capsule Take 3-6 capsules (75-150 mg total) by mouth daily. Yes   doxepin (SINEQUAN) 10 MG capsule TAKE ONE (1) CAPSULE BY MOUTH AT BEDTIME. Yes    dulaglutide 4.5 mg/0.5 mL PnIj Inject 4.5 mg subcutaneously every 7 days. Yes   EPINEPHrine (EPI-PEN) 0.3 mg/0.3 mL AtIn injection Inject into the muscle. Yes   ezetimibe (ZETIA) 10 mg tablet TAKE ONE TABLET BY MOUTH EVERY DAY Yes   famotidine (PEPCID) 40 MG tablet Take 1 tablet (40 mg total) by mouth 2 times a day. Yes   fexofenadine (ALLEGRA) 180 MG tablet Take 1 tablet (180 mg total) by mouth daily. Yes   fluticasone propionate (FLONASE) 50 mcg/actuation nasal spray Use 1 spray into each nostril daily. Yes   furosemide (LASIX) 20 MG tablet Take 1 tablet (20 mg total) by mouth daily. Yes   hydrOXYchloroQUINE (PLAQUENIL) 200 mg tablet Take by mouth daily with breakfast. Yes   hydrOXYzine HCL (ATARAX) 25 MG tablet 25 mg. Once daily at nighttime Yes   levothyroxine (SYNTHROID) 175 MCG tablet TAKE 1 TABLET BY MOUTH ONCE DAILY. Yes   montelukast (SINGULAIR) 10 mg tablet TAKE ONE TABLET BY MOUTH AT BEDTIME. Yes   mupirocin (BACTROBAN) 2 % ointment Apply topically 3 times a day. Yes   omeprazole (PRILOSEC) 40 MG capsule Take by mouth. Twice a day Yes   onabotulinumtoxinA (BOTOX COSMETIC) 50 unit SolR Inject into the muscle. Yes   triamcinolone (KENALOG) 0.1 % ointment APPLY 2 TIMES DAILY Yes   blood-glucose meter Misc Use meter to test blood glucose twice daily    doxycycline (MONODOX) 100 MG capsule Take 1 capsule (100 mg total) by mouth 2 times a day.    levalbuterol (XOPENEX) 1.25 mg/3 mL nebulizer solution Inhale 3 mL (1.25 mg total) by nebulization every 6 hours as needed for Wheezing.    nebulizers (COMPACT COMPRESSOR NEBULIZER) Misc Please dispense 1 nebulizer with tubing and mouthpiece.    TRUE METRIX GLUCOSE TEST STRIP Strp USE TO TEST BLOOD SUGAR 2 TIMES DAILY    TRUEPLUS LANCETS 30 gauge Misc USE TWICE A DAY AS DIRECTED.    UNABLE TO FIND Rx for Therapeutic Matrress for prevention of migraine d/t cervicogenic trigger from poor muscle alignment of back and neck with regular mattress.  She gets regular  physical therapy and it helps somewhat but therapeutic mattress is recommended. She also gets Botox injections every 3 months but if a therapeutic mattress can help reduce her headache burden, this may be able to be  stretched out to every 4 months or so.  She has severe Mast Cell Disorder and being in the office for the injections can flare her Mast Cell and cause severe reactions just from the environment.        Allergies   Allergen Reactions   ??? Acetaminophen Anaphylaxis     Unknown   ??? Adhesive Tape-Silicones Itching, Rash, Swelling and Anaphylaxis     Unknown   ??? Alcohol Anaphylaxis   ??? Amitriptyline Anaphylaxis   ??? Aspirin Anaphylaxis     Unknown   ??? Benzoic Acid Anaphylaxis   ??? Blue Dye Anaphylaxis     Unknown   ??? Flecainide Shortness Of Breath and Anaphylaxis     Itching, decreased libido, wheezing, flushing, numbness.    ??? Gabapentin Anaphylaxis   ??? Iodinated Contrast Media Anaphylaxis   ??? Isopropyl Alcohol Anaphylaxis   ??? Latex Anaphylaxis and Rash   ??? Levofloxacin Itching and Anaphylaxis   ??? Lidocaine Anaphylaxis   ??? Nsaids (Non-Steroidal Anti-Inflammatory Drug) Anaphylaxis   ??? Onion Anaphylaxis   ??? 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   ??? Paraben Anaphylaxis   ??? Perfume Anaphylaxis   ??? Pregabalin Anaphylaxis   ??? Preservative Anaphylaxis   ??? Quetiapine Anaphylaxis     Mental Status Change  Other reaction(s): Angioedema   ??? Red Dye Anaphylaxis     Unknown   ??? Sulfa (Sulfonamide Antibiotics) Anaphylaxis   ??? Sulfur Anaphylaxis     Pt states: Any alcohol product   ??? Trazodone Rash and Anaphylaxis   ??? Verapamil Anaphylaxis   ??? Yellow Dye Anaphylaxis     Unknown   ??? Green Dye Rash     Other reaction(s): Contact  Dermatitis   ??? Onion Extract Other (See Comments)   ??? Doxepin Other (See Comments)     Sedation and sleep paralysis (side effect)   ??? Duloxetine      Other reaction(s): Other (See Comments)  Mental Status Change   ??? Epi E-Z Pen    ??? Hydroxyzine Itching     Unknown   ??? Statins-Hmg-Coa Reductase Inhibitors    ??? Verapamil (Bulk) Hives     Insomnia, flushing, scattered brain, migraine, itching, anxiety.      Physical Exam     Temp:  [97 ??F (36.1 ??C)-97.2 ??F (36.2 ??C)] 97 ??F (36.1 ??C)  Heart Rate:  [73-83] 83  Resp:  [14-18] 16  BP: (126-143)/(69-84) 132/77    Physical Exam  Constitutional:       General: She is not in acute distress.     Appearance: Normal appearance. She is obese. She is not ill-appearing or diaphoretic.   HENT:      Head: Normocephalic and atraumatic.      Right Ear: External ear normal.      Left Ear: External ear normal.      Nose: Nose normal.      Mouth/Throat:      Mouth: Mucous membranes are moist.      Pharynx: Oropharynx is clear.   Eyes:      General: No scleral icterus.     Extraocular Movements: Extraocular movements intact.      Conjunctiva/sclera: Conjunctivae normal.   Cardiovascular:  Rate and Rhythm: Normal rate and regular rhythm.      Pulses: Normal pulses.      Heart sounds: Normal heart sounds.   Pulmonary:      Effort: Pulmonary effort is normal. No respiratory distress.      Breath sounds: Normal breath sounds.   Abdominal:      General: Abdomen is flat. Bowel sounds are normal. There is no distension.      Palpations: Abdomen is soft.      Tenderness: There is no abdominal tenderness.   Musculoskeletal:         General: No swelling. Normal range of motion.      Cervical back: Normal range of motion and neck supple.   Skin:     General: Skin is warm and dry.      Findings: Rash (Bilateral forearm urticaria, appear dry and some healed) present.   Neurological:      General: No focal deficit present.      Mental Status: She is alert and oriented to person, place, and time.       Motor: No weakness.   Psychiatric:         Mood and Affect: Mood normal.         Behavior: Behavior normal.         Thought Content: Thought content normal.           Diagnostic Studies     Labs: I have personally reviewed labs. Hgb normal. WBCs minimally elevated at 11.0 in the setting of known pro-inflammatory disease. Platelets normal. Electrolytes normal. Renal function normal. No coagulopathy. Troponins negative x2.     08/28/2020 @ 1157 - EKG   Ventricular Rate: ??72 ??BPM   Atrial Rate: ??72 ??BPM   P-R Interval: ??152 ??ms   QRS Duration: ??94 ??ms   QT: ??478 ??ms   QTc: ??523 ??ms   P Axis: ??36 ??degrees   R Axis: ??20 ??degrees   T Axis: ??94 ??degrees   Normal sinus rhythm, T wave inversion in anterior leads, prolonged QT    08/28/2020 @ 1607 - EKG  Ventricular Rate: ??86 ??BPM   Atrial Rate: ??86 ??BPM   P-R Interval: ??146 ??ms   QRS Duration: ??84 ??ms   QT: ??408 ??ms   QTc: ??488 ??ms   P Axis: ??33 ??degrees   R Axis: ??16 ??degrees   T Axis: ??122 ??degrees   Normal sinus rhythm, ST segment change and lateral T wave inversion, non-specific T wave change in inferior leads    I have personally reviewed imaging studies.     08/28/2020 CXR - Portable   Impression:  No acute cardiopulmonary disease    08/28/2020 TTE  Study Conclusions:  - Left ventricle: The cavity size is normal. Wall thickness was increased in a pattern of mild LVH.   ????Systolic function was normal. Wall motion was normal; there were no regional wall motion   ????abnormalities. Doppler parameters are consistent with abnormal left ventricular relaxation (grade   ????1 diastolic dysfunction).   - Mitral valve: Mild regurgitation.   - Left atrium: The atrium is mildly dilated.   - Right ventricle: Systolic function was normal.   - Pericardium, extracardiac: A trivial pericardial effusion is identified. Features were not   ????consistent with tamponade physiology.     Impressions: ??No previous study was available for comparison.    Assessment & Plan     Emily Bonilla is a 60  y.o. with a history of WPW (unsuccessful  ablation previously x2) , paroxysmal atrial fibrillation (not on anticoagulation), common variable immunodeficiency disease, mast cell activation syndrome, chronic urticaria, recurrent anaphylaxis with past episode of cardiac arrest, T2DM on Trulicity, GERD, Hashimoto thyroiditis, hypoparathyroidism, anxiety, and depression directly admitted to the hospital  by her primary care physician Dr. Alpha Gula with chronic onset exertional dyspnea with concern for angina.    #Chronic dyspnea on exertion with associated chest pressure  #Paroxysmal atrial fibrillation (not on anticoagulation)  #Mitral regurgitation   #Hx of WPW (remote history of unsuccessful ablation x2)  Differential includes stable angina vs heart failure due valvular heart disease vs arrhythmia. Direct admission in the setting of complex comorbid hypersensitivities. Initial EKG with mild T wave changes compared to prior, troponins negative x2. CXR without acute cardiopulmonary disease. TTE with normal systolic function, grade I diastolic dysfunction, mild LVH, no regional wall motion abnormalities, mild LA dilation, mild MR and trace pericardial effusion without evidence of tamponade. Cardiology aware, recommended serial troponins and EKGs to elucidate what further work-up needs to be completed. No anemia. No coagulopathy.  - CHADS2VASC score of 2 (female, T2DM) with 2.2% per year stroke risk.    Plan:  - Serial EKGs and high sensitivity troponins  - Continuous telemetry   - Cardiology consult  - Consider anticoagulation for paroxysmal atrial fibrillation now with an increased risk    #Systemic mastocytosis/mast cell activation syndrome  #Chronic urticaria  #Idiopathic anaphylaxis  #Hx of CVID  Significant history of allergic reaction and anaphylaxis to hospital based products. Continued home regimen. S/p IV benadryl, solumedrol and pepcid x1 initially on admission. No current allergic reactions noted. Betadine  preferred for skin prep. Documented allergies to aspirin, IV contrast and additional anti-hypertensive medications. Follows with Dr. Grover Canavan at [facility]. Current home medication regimen has been successful at controlling the majority of her symptoms outpatient. Current outpatient regimen: diphenhydramine PO 50-150 mg TID, cetirizine 10 mg daily (with additional PM dose as needed), fexofenadine 180 mg nightly, famotidine 40 mg BID, hydroxyzine 25 mg nightly, doxepin 10 mg nightly, cromolyn 200 mg TID, montelukast 10 mg nightly, and hydroxychloroquine. States that while on this regimen, her use of epinephrine has decreased from several times per week, to 1-2 times per month.   Plan:  - Allergy and immunology consulted   - Continue home oral antihistamine and mast cell stabilizer regimen as listed above   - If develops urticaria with skin adhesives - able to give additional dose of PO antihistamine, low dose benadryl IV if unable to tolerate PO   - If plan for invasive procedure while inpatient - pre-medicate with prednisone 50mg  PO 13 hours prior, 7 hours prior, and 1 hour prior to surgery    - If plan for invasive procedure today ONLY (08/28/2020) then the prior administration of solumedrol can serve as the prophylaxis 13 hours prior -> would only need to complete the additional doses of prednisone 50mg  PO 7 hours and 1 hour prior to surgery  - Use betadine for skin pre-treatment  - Emergency epi-pen PRN - patient has four in her purse, pharmacy aware, can use patient supply  - No alcohol-related products, avoid strong odors  - Communication for proper precaution shared with the rest of the care team  ??  #T2DM  No acute complaints. Last A1c 6.1 on 7/26. Continue weekly dulaglutide (Wednesdays). No hyperglycemia this admission thus far.  - LDSSI if necessary  ??  #GERD  No acute complaints.   - Continue home famotidine and omeprazole  ??  #  Hypothyroidism  No acute complaints.   - Continue home levothyroxine  175 mcg daily  ??  #Anxiety & Depression  No acute complaints. Followed by psychiatry  - Continue home Buspar 20mg  BID and Conazepam 0.5mg  BID PRN  ??  #Hypertriglyceridemia  No acute complaints  - Continue home Zetia 10mg  daily    Orders Placed This Encounter   Procedures   ??? Diet regular     DVT prophylaxis: Subcutaneous heparin (prophylaxis)  Code Status: Full Code      Chima Astorino, DO   Internal Medicine, PGY-1  08/28/2020 4:40 PM

## 2020-08-28 NOTE — Unmapped (Signed)
SBAR: PATIENT HANDOFF MODEL    Situation:  Patient in office for eval of chest pain.  PCP, Dr. Tiana Loft is admitting for further work up   Background:  Patient Active Problem List   Diagnosis   ??? WPW syndrome   ??? Mast cell activation syndrome (CMS Dx)   ??? Multiple allergies   ??? Syncope and collapse   ??? Anxiety and depression   ??? Painful orthopaedic hardware (CMS Dx)   ??? History of asthma   ??? Hypothyroidism due to Hashimoto's thyroiditis   ??? Spells of decreased attentiveness   ??? Seizure (CMS Dx)   ??? Intractable seizures (CMS Dx)   ??? Caries   ??? CVID (common variable immunodeficiency) (CMS Dx)   ??? Type 2 diabetes mellitus without complication, with long-term current use of insulin (CMS Dx)   ??? Hyperthyroidism   ??? Weight gain   ??? Vitamin D deficiency   ??? Hyperparathyroidism (CMS Dx)   ??? Obesity (BMI 30.0-34.9)   ??? Poor venous access   ??? Vertigo, benign positional, unspecified laterality   ??? Tinnitus of both ears   ??? Muscle weakness of lower extremity   ??? Controlled type 2 diabetes mellitus with hyperglycemia, unspecified whether long term insulin use (CMS Dx)     Allergies   Allergen Reactions   ??? Acetaminophen Anaphylaxis     Unknown   ??? Adhesive Tape-Silicones Itching, Rash, Swelling and Anaphylaxis     Unknown   ??? Alcohol Anaphylaxis   ??? Amitriptyline Anaphylaxis   ??? Aspirin Anaphylaxis     Unknown   ??? Benzoic Acid Anaphylaxis   ??? Blue Dye Anaphylaxis     Unknown   ??? Flecainide Shortness Of Breath and Anaphylaxis     Itching, decreased libido, wheezing, flushing, numbness.    ??? Gabapentin Anaphylaxis   ??? Iodinated Contrast Media Anaphylaxis   ??? Isopropyl Alcohol Anaphylaxis   ??? Latex Anaphylaxis and Rash   ??? Levofloxacin Itching and Anaphylaxis   ??? Lidocaine Anaphylaxis   ??? Nsaids (Non-Steroidal Anti-Inflammatory Drug) Anaphylaxis   ??? Onion Anaphylaxis   ??? Other Anaphylaxis     HAS MAST CELL ACTIVATION SYNDROME, SO PATIENT HAS ANAPHYLACTIC REACTIONS IF BODY RECOGNIZES TRIGGERS  CANNOT TOLERATE ANY DYES,  PRESERVATIVES, PARABENS, ETC.  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   ??? Paraben Anaphylaxis   ??? Perfume Anaphylaxis   ??? Pregabalin Anaphylaxis   ??? Preservative Anaphylaxis   ??? Quetiapine Anaphylaxis     Mental Status Change  Other reaction(s): Angioedema   ??? Red Dye Anaphylaxis     Unknown   ??? Sulfa (Sulfonamide Antibiotics) Anaphylaxis   ??? Sulfur Anaphylaxis     Pt states: Any alcohol product   ??? Trazodone Rash and Anaphylaxis   ??? Verapamil Anaphylaxis   ??? Yellow Dye Anaphylaxis     Unknown   ??? Green Dye Rash     Other reaction(s): Contact Dermatitis   ??? Onion Extract Other (See Comments)   ??? Doxepin Other (See Comments)     Sedation and sleep paralysis (side effect)   ??? Duloxetine      Other reaction(s): Other (See Comments)  Mental Status Change   ??? Epi E-Z Pen    ??? Hydroxyzine Itching     Unknown   ??? Statins-Hmg-Coa Reductase Inhibitors    ??? Verapamil (Bulk) Hives     Insomnia, flushing, scattered brain, migraine, itching,  anxiety.        Current Outpatient Medications:   ???  azelastine (ASTELIN) 137 mcg (0.1 %) nasal spray, Use into each nostril., Disp: , Rfl:   ???  blood-glucose meter Misc, Use meter to test blood glucose twice daily, Disp: 1 each, Rfl: 0  ???  budesonide (PULMICORT) 0.5 mg/2 mL, Take 0.5 mg by mouth., Disp: , Rfl:   ???  busPIRone (BUSPAR) 10 MG tablet, Take 2 tablets (20 mg total) by mouth in the morning and at bedtime. Indications: Generalized Anxiety Disorder, Disp: 360 tablet, Rfl: 0  ???  cetirizine (ZYRTEC) 10 mg Cap, Take 10 mg by mouth daily., Disp: 30 capsule, Rfl: 2  ???  cholecalciferol, vitamin D3, 125 mcg (5,000 unit) capsule, Take 1 capsule (5,000 Units total) by mouth every Monday, Wednesday, and Friday., Disp: 40 capsule, Rfl: 3  ???  clonazePAM (KLONOPIN) 0.5 MG tablet, Take 1 tablet (0.5 mg total) by mouth 2 times a day as needed for Anxiety for up to 60 days.,  Disp: 60 tablet, Rfl: 1  ???  cromolyn (GASTROCROM) 100 mg/5 mL solution, Take 10 mLs (200 mg total) by mouth 4 times daily before meals and at bedtime., Disp: 480 mL, Rfl: 11  ???  diphenhydrAMINE (BENADRYL) 25 mg capsule, Take 3-6 capsules (75-150 mg total) by mouth daily., Disp: 90 capsule, Rfl: 2  ???  doxepin (SINEQUAN) 10 MG capsule, TAKE ONE (1) CAPSULE BY MOUTH AT BEDTIME., Disp: 30 capsule, Rfl: 0  ???  doxycycline (MONODOX) 100 MG capsule, Take 1 capsule (100 mg total) by mouth 2 times a day., Disp: 30 capsule, Rfl: 1  ???  dulaglutide 4.5 mg/0.5 mL PnIj, Inject 4.5 mg subcutaneously every 7 days., Disp: 2 mL, Rfl: 11  ???  EPINEPHrine (EPI-PEN) 0.3 mg/0.3 mL AtIn injection, Inject into the muscle., Disp: , Rfl:   ???  ezetimibe (ZETIA) 10 mg tablet, TAKE ONE TABLET BY MOUTH EVERY DAY, Disp: 30 tablet, Rfl: 3  ???  famotidine (PEPCID) 40 MG tablet, Take 1 tablet (40 mg total) by mouth 2 times a day., Disp: 180 tablet, Rfl: 3  ???  fexofenadine (ALLEGRA) 180 MG tablet, Take 1 tablet (180 mg total) by mouth daily., Disp: 30 tablet, Rfl: 3  ???  fluticasone propionate (FLONASE) 50 mcg/actuation nasal spray, Use 1 spray into each nostril daily., Disp: 16 g, Rfl: 1  ???  furosemide (LASIX) 20 MG tablet, Take 1 tablet (20 mg total) by mouth daily., Disp: 90 tablet, Rfl: 3  ???  hydrOXYchloroQUINE (PLAQUENIL) 200 mg tablet, Take by mouth daily with breakfast., Disp: , Rfl:   ???  levalbuterol (XOPENEX) 1.25 mg/3 mL nebulizer solution, Inhale 3 mL (1.25 mg total) by nebulization every 6 hours as needed for Wheezing., Disp: 72 mL, Rfl: 0  ???  levothyroxine (SYNTHROID) 175 MCG tablet, TAKE 1 TABLET BY MOUTH ONCE DAILY., Disp: 90 tablet, Rfl: 3  ???  montelukast (SINGULAIR) 10 mg tablet, TAKE ONE TABLET BY MOUTH AT BEDTIME., Disp: 30 tablet, Rfl: 0  ???  mupirocin (BACTROBAN) 2 % ointment, Apply topically 3 times a day., Disp: 15 g, Rfl: 3  ???  nebulizers (COMPACT COMPRESSOR NEBULIZER) Misc, Please dispense 1 nebulizer with tubing and mouthpiece.,  Disp: 1 each, Rfl: 0  ???  onabotulinumtoxinA (BOTOX COSMETIC) 50 unit SolR, Inject into the muscle., Disp: , Rfl:   ???  triamcinolone (KENALOG) 0.1 % ointment, APPLY 2 TIMES DAILY, Disp: 80 g, Rfl: 11  ???  TRUE METRIX GLUCOSE TEST STRIP Strp,  USE TO TEST BLOOD SUGAR 2 TIMES DAILY, Disp: 100 strip, Rfl: 3  ???  TRUEPLUS LANCETS 30 gauge Misc, USE TWICE A DAY AS DIRECTED., Disp: 100 each, Rfl: 3  ???  UNABLE TO FIND, Rx for Therapeutic Matrress for prevention of migraine d/t cervicogenic trigger from poor muscle alignment of back and neck with regular mattress.  She gets regular physical therapy and it helps somewhat but therapeutic mattress is recommended. She also gets Botox injections every 3 months but if a therapeutic mattress can help reduce her headache burden, this may be able to be stretched out to every 4 months or so.  She has severe Mast Cell Disorder and being in the office for the injections can flare her Mast Cell and cause severe reactions just from the environment., Disp: 1 each, Rfl: 0      Assessment:  BP 126/69 (BP Location: Left arm, Patient Position: Sitting, BP Cuff Size: Regular)    Pulse 82    Temp 97.2 ??F (36.2 ??C) (Temporal)    Ht 5' 7 (1.702 m)    Wt 201 lb (91.2 kg)    LMP  (LMP Unknown)    SpO2 97%    BMI 31.48 kg/m??       Recommendations:  Admit to hospital for continued workup.

## 2020-08-28 NOTE — Unmapped (Signed)
[facility]  DIVISION OF OTOLARYNGOLOGY- HEAD & NECK SURGERY  GENERAL INPATIENT CONSULTATION    Patient Name: Emily Bonilla  Medical Record Number:  [id number]  Requesting Attending: Rickey Primus, DO  Consulting Attending: Ellery Plunk, MD  Service Requesting Consult: ED.  Primary Care Physician:  Court Joy, MD  Date of Admission: 08/28/2020  Date of Consultation: 08/28/2020    ASSESSMENT  Emily Bonilla is a 60 y.o. female with PMHx of anxiety, depression, CVID, mast cell activation syndrome, and complex history of recurrent idiopathic anaphylaxis, admitted on 08/28/20 for progressive angina. A rapid response was called after patient reported anaphylaxis with airway obstruction and wheezing, stable vitals, no swelling, no stridor.  She remained stable on RA without desats, change of color, facial swelling, tongue swelling, or increased WOB. ENT was later consulted for airway management. Upon ENT arrival, the patient was asymptomatic, without any lip, tongue, or OP swelling.    PLAN  -no airway concerns at this time  -will hold flexible scope at this time due to absence of upper airway symptoms and because patient reports multiple adverse reactions from simple medical procedures  -airway watch, CMU  -medical management of allergy per Medicine  -ENT will continue to monitor patient's airway overnight    This assessment and plan, and all management questions were discussed with the above mentioned ENT attending who agreed.    REASON FOR CONSULT:  Advice requested regarding     ADMISSION DIAGNOSIS:  <principal problem not specified>    HISTORY OF PRESENT ILLNESS  [name] is a 60 y.o. female with PMHx of anxiety, depression, CVID, mast cell activation syndrome, DM2, obesity, hypothyroidism, hypoparathyroidism, seizures, painful orthopaedic hardware, admitted on 08/28/20 for chest pain and dyspnea on exertion during her annual wellness visit with IM. A rapid response was called after  patient reported anaphylaxis with airway obstruction and wheezing, stable vitals, no swelling, nos stridor. Patient received 2 epi pens, benadryl, albuterol, and H2 blockers. After fluids were started, patient reported that cold fluids triggered her into another anaphylactic reaction for which she received a 3rd epi pen. She remained stable on RA without desats, change of color, facial swelling, tongue swelling, increased WOB.     Patient has significant history of chronic urticaria, recurrent idiopathic anaphylaxis with reported past episode of cardiac arrest, and many episodes of acute and diffuse skin rashes, and contact dermatitis. Triggers are multiple, from wearing a face mask to odors, alcohol wipes, and different types of tapes and stickers. Patient reports having had LOC, seizures with loss of urine in stores due to unknown triggers. She is currently on multiple meds including diphenhydramine PO 50-150 mg TID, cetirizine 10 mg daily, fexofenadine 180 mg nightly, famotidine 40 mg BID, hydroxyzine 25 mg nightly, doxepin 10 mg nightly, cromolyn 200 mg TID, montelukast 10 mg nightly, and hydroxychloroquine which have improved the frequency of her attacks (from 8 episodes per week to 3 per month currently).     Patient denies history of acute SOB, stridor requiring intubation, tongue swelling, dysphagia or dysphonia. She has never been scope and has never seen an ENT for her anaphylaxis episodes.    Patient Active Problem List   Diagnosis   ??? WPW syndrome   ??? Mast cell activation syndrome (CMS Dx)   ??? Multiple allergies   ??? Syncope and collapse   ??? Anxiety and depression   ??? Painful orthopaedic hardware (CMS Dx)   ??? History of asthma   ??? Hypothyroidism due to Hashimoto's  thyroiditis   ??? Spells of decreased attentiveness   ??? Seizure (CMS Dx)   ??? Intractable seizures (CMS Dx)   ??? Caries   ??? CVID (common variable immunodeficiency) (CMS Dx)   ??? Type 2 diabetes mellitus without complication, with long-term current  use of insulin (CMS Dx)   ??? Hyperthyroidism   ??? Weight gain   ??? Vitamin D deficiency   ??? Hyperparathyroidism (CMS Dx)   ??? Obesity (BMI 30.0-34.9)   ??? Poor venous access   ??? Vertigo, benign positional, unspecified laterality   ??? Tinnitus of both ears   ??? Muscle weakness of lower extremity   ??? Controlled type 2 diabetes mellitus with hyperglycemia, unspecified whether long term insulin use (CMS Dx)     Past Surgical History:   Procedure Laterality Date   ??? BONE MARROW BIOPSY      benign   ??? CARDIAC ELECTROPHYSIOLOGY MAPPING AND ABLATION      attempted x 2, states not successful either time   ??? CHOLECYSTECTOMY  2015   ??? DENTAL EXTRACTION Right 04/25/2017    Procedure: EXTRACTION TOOTH # 31, BRIDGE RESECTIONING;  Surgeon: Gladys Damme, DMD;  Location: [facility] OR;  Service: Oral Surgery;  Laterality: Right;   ??? FACIAL COSMETIC SURGERY     ??? HYSTERECTOMY  2009   ??? INSERTION CATHETER VASCULAR ACCESS N/A 09/06/2017    Procedure: INSERTION POWER PORT A CATH;  Surgeon: Penni Bombard, MD;  Location: [facility] OR;  Service: General;  Laterality: N/A;   ??? LIVER BIOPSY      benign hepatic adednoma per patient   ??? REMOVAL HARDWARE LEG Left 08/17/2016    Procedure: REMOVAL OF HARDWARE LEFT TIBIA;  Surgeon: Andee Lineman, MD;  Location: [facility] OR;  Service: Orthopedics;  Laterality: Left;   ??? TIBIA FRACTURE SURGERY     ??? TONSILLECTOMY      + adenoids     Family History   Problem Relation Age of Onset   ??? Kidney failure Mother    ??? Aneurysm Mother      Social History     Socioeconomic History   ??? Marital status: Divorced     Spouse name: Not on file   ??? Number of children: Not on file   ??? Years of education: Not on file   ??? Highest education level: Not on file   Occupational History   ??? Not on file   Tobacco Use   ??? Smoking status: Never Smoker   ??? Smokeless tobacco: Never Used   Vaping Use   ??? Vaping Use: Never used   Substance and Sexual Activity   ??? Alcohol use: No   ??? Drug use: No   ??? Sexual activity: Not Currently   Other Topics Concern   ???  Caffeine Use Yes     Comment: 1-2 Cups Coffee Daily   ??? Occupational Exposure No   ??? Exercise Yes     Comment: walking and resistance bands daily   ??? Seat Belt Yes   Social History Narrative   ??? Not on file     Social Determinants of Health     Financial Resource Strain: Not on file   Physical Activity: Not on file   Stress: Not on file   Social Connections: Not on file   Housing Stability: Not on file       DRUG/FOOD ALLERGIES: Acetaminophen, Adhesive tape-silicones, Alcohol, Amitriptyline, Aspirin, Benzoic acid, Blue dye, Flecainide, Gabapentin, Iodinated contrast media, Isopropyl alcohol, Latex, Levofloxacin, Lidocaine, Nsaids (  non-steroidal anti-inflammatory drug), Onion, Other, Paraben, Perfume, Pregabalin, Preservative, Quetiapine, Red dye, Sulfa (sulfonamide antibiotics), Sulfur, Trazodone, Verapamil, Yellow dye, Green dye, Onion extract, Doxepin, Duloxetine, Epi e-z pen, Hydroxyzine, Statins-hmg-coa reductase inhibitors, and Verapamil (bulk)      CURRENT MEDICATIONS  Prior to Admission medications    Medication Sig Start Date End Date Taking? Authorizing Provider   azelastine (ASTELIN) 137 mcg (0.1 %) nasal spray Use into each nostril. 04/01/20  Yes Historical Provider, MD   budesonide (PULMICORT) 0.5 mg/2 mL Take 0.5 mg by mouth.   Yes Historical Provider, MD   busPIRone (BUSPAR) 10 MG tablet Take 2 tablets (20 mg total) by mouth in the morning and at bedtime. Indications: Generalized Anxiety Disorder 08/21/20 11/19/20 Yes Nicholaus Bloom, MD   cetirizine (ZYRTEC) 10 mg Cap Take 10 mg by mouth daily. 10/19/18  Yes Heepke Laren Everts, MD   cholecalciferol, vitamin D3, 125 mcg (5,000 unit) capsule Take 1 capsule (5,000 Units total) by mouth every Monday, Wednesday, and Friday. 10/19/19  Yes Orbie Pyo, MD   clonazePAM (KLONOPIN) 0.5 MG tablet Take 1 tablet (0.5 mg total) by mouth 2 times a day as needed for Anxiety for up to 60 days. 08/21/20 10/20/20 Yes Nicholaus Bloom, MD   cromolyn (GASTROCROM) 100 mg/5 mL  solution Take 10 mLs (200 mg total) by mouth 4 times daily before meals and at bedtime. 05/07/20  Yes Hany Al-Khedr, MD   diphenhydrAMINE (BENADRYL) 25 mg capsule Take 3-6 capsules (75-150 mg total) by mouth daily. 10/19/18  Yes Heepke Laren Everts, MD   doxepin (SINEQUAN) 10 MG capsule TAKE ONE (1) CAPSULE BY MOUTH AT BEDTIME. 02/22/19  Yes Desmond Dike, MD   dulaglutide 4.5 mg/0.5 mL PnIj Inject 4.5 mg subcutaneously every 7 days. 04/03/20 04/03/21 Yes Orbie Pyo, MD   EPINEPHrine (EPI-PEN) 0.3 mg/0.3 mL AtIn injection Inject into the muscle. 09/25/19  Yes Historical Provider, MD   ezetimibe (ZETIA) 10 mg tablet TAKE ONE TABLET BY MOUTH EVERY DAY 05/01/20  Yes Orbie Pyo, MD   famotidine (PEPCID) 40 MG tablet Take 1 tablet (40 mg total) by mouth 2 times a day. 05/07/20  Yes Hany Al-Khedr, MD   fexofenadine (ALLEGRA) 180 MG tablet Take 1 tablet (180 mg total) by mouth daily. 10/19/18  Yes Heepke Laren Everts, MD   fluticasone propionate (FLONASE) 50 mcg/actuation nasal spray Use 1 spray into each nostril daily. 10/19/18  Yes Heepke Laren Everts, MD   furosemide (LASIX) 20 MG tablet Take 1 tablet (20 mg total) by mouth daily. 06/27/20  Yes Court Joy, MD   hydrOXYchloroQUINE (PLAQUENIL) 200 mg tablet Take by mouth daily with breakfast.   Yes Historical Provider, MD   hydrOXYzine HCL (ATARAX) 25 MG tablet 25 mg. Once daily at nighttime 09/07/19  Yes Historical Provider, MD   levothyroxine (SYNTHROID) 175 MCG tablet TAKE 1 TABLET BY MOUTH ONCE DAILY. 07/29/20  Yes Orbie Pyo, MD   montelukast (SINGULAIR) 10 mg tablet TAKE ONE TABLET BY MOUTH AT BEDTIME. 02/26/19  Yes Court Joy, MD   mupirocin (BACTROBAN) 2 % ointment Apply topically 3 times a day. 04/17/20  Yes Lorretta Harp, MD   omeprazole (PRILOSEC) 40 MG capsule Take by mouth. Twice a day 03/27/20  Yes Historical Provider, MD   onabotulinumtoxinA (BOTOX COSMETIC) 50 unit SolR Inject into the muscle.   Yes Historical Provider, MD    triamcinolone (KENALOG) 0.1 % ointment APPLY 2 TIMES DAILY 12/27/17  Yes Ilona Dubuske V, DO   blood-glucose  meter Misc Use meter to test blood glucose twice daily 06/29/17   Loyal Jacobson, CNP   doxycycline (MONODOX) 100 MG capsule Take 1 capsule (100 mg total) by mouth 2 times a day. 05/22/20   Lorretta Harp, MD   levalbuterol (XOPENEX) 1.25 mg/3 mL nebulizer solution Inhale 3 mL (1.25 mg total) by nebulization every 6 hours as needed for Wheezing. 10/19/18   Heepke Laren Everts, MD   nebulizers (COMPACT COMPRESSOR NEBULIZER) Misc Please dispense 1 nebulizer with tubing and mouthpiece. 10/19/18   Heepke Laren Everts, MD   TRUE METRIX GLUCOSE TEST STRIP Strp USE TO TEST BLOOD SUGAR 2 TIMES DAILY 03/31/20   Orbie Pyo, MD   TRUEPLUS LANCETS 30 gauge Misc USE TWICE A DAY AS DIRECTED. 05/01/20   Orbie Pyo, MD   UNABLE TO FIND Rx for Therapeutic Matrress for prevention of migraine d/t cervicogenic trigger from poor muscle alignment of back and neck with regular mattress.  She gets regular physical therapy and it helps somewhat but therapeutic mattress is recommended. She also gets Botox injections every 3 months but if a therapeutic mattress can help reduce her headache burden, this may be able to be stretched out to every 4 months or so.  She has severe Mast Cell Disorder and being in the office for the injections can flare her Mast Cell and cause severe reactions just from the environment. 08/08/20   Stefan Church, CNP          Inpatient Meds:  Scheduled:  ??? budesonide  0.5 mg Oral BID   ??? busPIRone  20 mg Oral TID   ??? cetirizine  10 mg Oral Daily 0900   ??? [START ON 08/29/2020] cholecalciferol (vitamin D3)  5,000 Units Oral Once per day on Mon Wed Fri   ??? diphenhydrAMINE  50 mg Oral TID   ??? doxepin  10 mg Oral Nightly (2100)   ??? doxycycline  100 mg Oral BID   ??? ezetimibe  10 mg Oral Daily 0900   ??? famotidine  40 mg Oral BID   ??? [START ON 08/29/2020] fexofenadine  180 mg Oral DAILY 2100   ???  fluticasone propionate  1 spray Each Nare Daily 0900   ??? furosemide  20 mg Oral Daily 0900   ??? heparin  5,000 Units Subcutaneous 3 times per day   ??? hydrOXYchloroQUINE  400 mg Oral Nightly (2100)   ??? hydrOXYzine HCL  25 mg Oral Nightly (2100)   ??? levothyroxine  175 mcg Oral Daily 0900   ??? montelukast  10 mg Oral Nightly (2100)   ??? mupirocin   Topical TID   ??? omeprazole  40 mg Oral BID     ZOX:[id number], albuterol, clonazePAM, diphenhydrAMINE, diphenhydrAMINE, diphenhydrAMINE, EPINEPHrine, EPINEPHrine, EPINEPHrine, triamcinolone    REVIEW OF SYSTEMS  The following systems were reviewed and revealed the following in addition to any already discussed in the HPI:    Negative except per HPI.    PHYSICAL EXAM  BP 132/77    Pulse 83    Temp 97 ??F (36.1 ??C) (Temporal)    Resp 16    Ht 5' 7 (1.702 m)    Wt 203 lb (92.1 kg)    LMP  (LMP Unknown)    SpO2 95%    BMI 31.79 kg/m??     GENERAL: No Acute Distress, Alert and Oriented x 3, breathing quietly and comfortably on RA, sleeping on ENT's arrival, no stridor, no stertor, no cough, no wheezing, no retractions  EYES: Anti-icteric, no eyelid edema, no epiphora, no conjunctivitis   NOSE: No epistaxis, nasal mucosa within normal limits, no purulent drainage, no rhinorrhea, no nasal congestion  FACE: 1/6 House-Brackmann Scale, symmetric, sensation equal bilaterally, no facial swelling, no lip swelling, no skin rash, no flushed cheeks  ORAL CAVITY/OROPHARYNX: No masses or lesions palpated, uvula is midline, moist mucous membranes, pharyngeal walls symmetric, no tongue swelling, intact tongue protraction, no OP swelling, uvula is crisp  NECK: Normal range of motion, no neck masses, no neck swelling, no erythema  CHEST: Normal respiratory effort, no retractions on RA    LABORATORY  Past 24 hour labs:   Lab Results   Component Value Date    WBC 11.0 (H) 08/28/2020    HGB 12.9 08/28/2020    HCT 39.3 08/28/2020    MCV 82.5 08/28/2020    PLT 304 08/28/2020     Lab Results   Component  Value Date    GLUCOSE 109 (H) 08/28/2020    BUN 8 08/28/2020    CREATININE 0.84 08/28/2020    K 3.7 08/28/2020    NA 144 08/28/2020    CL 108 08/28/2020    CALCIUM 8.4 (L) 08/28/2020     Lab Results   Component Value Date    MG 1.9 08/28/2020     Lab Results   Component Value Date    PHOS 2.7 08/28/2020     Lab Results   Component Value Date    ALKPHOS 127 (H) 11/23/2019    ALT 28 11/23/2019    AST 31 11/23/2019    BILITOT 0.4 11/23/2019    ALBUMIN 4.0 08/28/2020    BILIDIRECT 0.0 11/23/2019    PROT 7.7 11/23/2019     No results found for: LDH  No results found for: PTT  No results found for: AMYLASE  Lab Results   Component Value Date    LIPASE 46 11/23/2019       RADIOLOGY  X-ray Portable Chest   Final Result   IMPRESSION:    No acute cardiopulmonary disease. No significant change compared to 09/06/2017.      Report Verified by: Glean Salvo, MD at 08/28/2020 12:55 PM EDT

## 2020-08-28 NOTE — Unmapped (Signed)
Allergy and Immunology Inpatient Consultation Note    Requesting Provider: Rickey Primus, DO  Consulting Attending: Dr. Harley Hallmark  Date of Admission: 08/28/2020  Date of consultation: 08/28/2020      Reason for Admission: ANGINA  Reason for Consultation: Mast cell activation syndrome    History of Present Illness:      Emily Bonilla is a 60 y.o. 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 Allergy & immunology service is consulted for medication management in the setting of patient's numerous listed allergies.     Patient follows with Dr. Grover Canavan at [facility] for diagnoses of mast cell activation syndrome (MCAS), chronic urticaria, and idiopathic anaphylaxis. Per discussion with the patient, she is well controlled on her medication regimen which includes multiple antihistamines, mast cell stabilizers, and hydroxychloroquine.     Patient states she is currently using diphenhydramine PO 50-150 mg TID, cetirizine 10 mg daily (with additional PM dose as needed), fexofenadine 180 mg nightly, famotidine 40 mg BID, hydroxyzine 25 mg nightly, doxepin 10 mg nightly, cromolyn 200 mg TID, montelukast 10 mg nightly, and hydroxychloroquine. States that while on this regimen, her use of epinephrine has decreased from several times per week, to 1-2 times per month.     Regarding her listed allergies to adhesives, alcohol swab pads, and EKG stickers, patient states she has had anaphylaxis-like reactions in the past, but these reactions do not occur every time she is exposed to the material. She did receive diphenhydramine, famotidine, and methylprednisolone today as prophylaxis prior to IV placement, which she tolerated well. She states that she has previously had urticaria at the site of EKG stickers, but had no issues with EKG stickers today.     I spoke with Dr. Grover Canavan  over the phone today, who was able to provide background on patients diagnosis as well as guidance for inpatient management while she undergoes cardiac evaluation.       Family History:  Noncontributory    Review of Systems:  GENERAL: denies unintended weight changes, fever or fatigue   EYES: denies changes in vision, watery or itchy eyes   ENT: denies changes in hearing or dental problems   CARDIOVASCULAR: denies chest pain, palpitations or edema  LUNGS: denies shortness of breath, wheezing, cough or difficulty breathing   ABDOMEN: denies abdominal pain, nausea, vomiting or diarrhea  GU: denies dysuria or hematuria  HEME/ONC: denies abnormal bleeding or bruising  MSK: denies muscle weakness, joint stiffness or joint pain   ENDO: denies heat or cold intolerance  NEURO: denies seizures, headaches or focal neurological deficits  PSYCH: denies anxiety or depression   SKIN: denies rash and pruritus     Past Medical History:  Past Medical History:   Diagnosis Date   ??? Anaphylaxis    ??? Anemia    ??? Anxiety    ??? Asthma    ??? Atrial fibrillation (CMS Dx)    ??? Bronchitis    ??? Heart disease    ??? Neurological disease    ??? OSA (obstructive sleep apnea)     states resolved   ??? Osteoporosis    ??? Psychogenic nonepileptic seizure    ??? Seizures (CMS Dx)    ??? Spontaneous pneumothorax     states pneumomediatsium/pneumopericardium, self resolved   ??? Thyroid disease    ??? Ulcer    ??? WPW (Wolff-Parkinson-White syndrome)  Past Surgical History:  Past Surgical History:   Procedure Laterality Date   ??? BONE MARROW BIOPSY      benign   ??? CARDIAC ELECTROPHYSIOLOGY MAPPING AND ABLATION      attempted x 2, states not successful either time   ??? CHOLECYSTECTOMY  2015   ??? DENTAL EXTRACTION Right 04/25/2017    Procedure: EXTRACTION TOOTH # 31, BRIDGE RESECTIONING;  Surgeon: Gladys Damme, DMD;  Location: [facility] OR;  Service: Oral Surgery;  Laterality: Right;   ??? FACIAL COSMETIC SURGERY     ??? HYSTERECTOMY  2009   ??? INSERTION CATHETER VASCULAR ACCESS N/A  09/06/2017    Procedure: INSERTION POWER PORT A CATH;  Surgeon: Penni Bombard, MD;  Location: [facility] OR;  Service: General;  Laterality: N/A;   ??? LIVER BIOPSY      benign hepatic adednoma per patient   ??? REMOVAL HARDWARE LEG Left 08/17/2016    Procedure: REMOVAL OF HARDWARE LEFT TIBIA;  Surgeon: Andee Lineman, MD;  Location: [facility] OR;  Service: Orthopedics;  Laterality: Left;   ??? TIBIA FRACTURE SURGERY     ??? TONSILLECTOMY      + adenoids       Family History:  Family History   Problem Relation Age of Onset   ??? Kidney failure Mother    ??? Aneurysm Mother        Social History:  Social History     Socioeconomic History   ??? Marital status: Divorced     Spouse name: Not on file   ??? Number of children: Not on file   ??? Years of education: Not on file   ??? Highest education level: Not on file   Occupational History   ??? Not on file   Tobacco Use   ??? Smoking status: Never Smoker   ??? Smokeless tobacco: Never Used   Vaping Use   ??? Vaping Use: Never used   Substance and Sexual Activity   ??? Alcohol use: No   ??? Drug use: No   ??? Sexual activity: Not Currently   Other Topics Concern   ??? Caffeine Use Yes     Comment: 1-2 Cups Coffee Daily   ??? Occupational Exposure No   ??? Exercise Yes     Comment: walking and resistance bands daily   ??? Seat Belt Yes   Social History Narrative   ??? Not on file     Social Determinants of Health     Financial Resource Strain: Not on file   Physical Activity: Not on file   Stress: Not on file   Social Connections: Not on file   Housing Stability: Not on file       Inpatient/Current Medications:  ??? budesonide  0.5 mg Oral BID   ??? busPIRone  20 mg Oral TID   ??? cetirizine  10 mg Oral Daily 0900   ??? [START ON 08/29/2020] cholecalciferol (vitamin D3)  5,000 Units Oral Once per day on Mon Wed Fri   ??? diphenhydrAMINE  100 mg Oral TID   ??? doxepin  10 mg Oral Nightly (2100)   ??? doxycycline  100 mg Oral BID   ??? EPINEPHrine  0.3 mg Intramuscular Once   ??? ezetimibe  10 mg Oral Daily 0900   ??? famotidine  40 mg Oral BID   ???  fluticasone propionate  1 spray Each Nare Daily 0900   ??? furosemide  20 mg Oral Daily 0900   ??? heparin  5,000 Units Subcutaneous 3 times per  day   ??? hydrOXYchloroQUINE  400 mg Oral Nightly (2100)   ??? hydrOXYzine HCL  25 mg Oral Nightly (2100)   ??? levothyroxine  175 mcg Oral Daily 0900   ??? montelukast  10 mg Oral Nightly (2100)   ??? mupirocin   Topical TID   ??? NON FORMULARY 180 mg  180 mg Oral Nightly (2100)   ??? NON FORMULARY 200 mg  200 mg Oral Q6H   ??? NON FORMULARY 40 mg  40 mg Oral BIDN       Outpatient Medications:  No current facility-administered medications on file prior to encounter.     Current Outpatient Medications on File Prior to Encounter   Medication Sig Dispense Refill   ??? azelastine (ASTELIN) 137 mcg (0.1 %) nasal spray Use into each nostril.     ??? budesonide (PULMICORT) 0.5 mg/2 mL Take 0.5 mg by mouth.     ??? busPIRone (BUSPAR) 10 MG tablet Take 2 tablets (20 mg total) by mouth in the morning and at bedtime. Indications: Generalized Anxiety Disorder 360 tablet 0   ??? cetirizine (ZYRTEC) 10 mg Cap Take 10 mg by mouth daily. 30 capsule 2   ??? cholecalciferol, vitamin D3, 125 mcg (5,000 unit) capsule Take 1 capsule (5,000 Units total) by mouth every Monday, Wednesday, and Friday. 40 capsule 3   ??? clonazePAM (KLONOPIN) 0.5 MG tablet Take 1 tablet (0.5 mg total) by mouth 2 times a day as needed for Anxiety for up to 60 days. 60 tablet 1   ??? cromolyn (GASTROCROM) 100 mg/5 mL solution Take 10 mLs (200 mg total) by mouth 4 times daily before meals and at bedtime. 480 mL 11   ??? diphenhydrAMINE (BENADRYL) 25 mg capsule Take 3-6 capsules (75-150 mg total) by mouth daily. 90 capsule 2   ??? doxepin (SINEQUAN) 10 MG capsule TAKE ONE (1) CAPSULE BY MOUTH AT BEDTIME. 30 capsule 0   ??? dulaglutide 4.5 mg/0.5 mL PnIj Inject 4.5 mg subcutaneously every 7 days. 2 mL 11   ??? EPINEPHrine (EPI-PEN) 0.3 mg/0.3 mL AtIn injection Inject into the muscle.     ??? ezetimibe (ZETIA) 10 mg tablet TAKE ONE TABLET BY MOUTH EVERY DAY 30  tablet 3   ??? famotidine (PEPCID) 40 MG tablet Take 1 tablet (40 mg total) by mouth 2 times a day. 180 tablet 3   ??? fexofenadine (ALLEGRA) 180 MG tablet Take 1 tablet (180 mg total) by mouth daily. 30 tablet 3   ??? fluticasone propionate (FLONASE) 50 mcg/actuation nasal spray Use 1 spray into each nostril daily. 16 g 1   ??? furosemide (LASIX) 20 MG tablet Take 1 tablet (20 mg total) by mouth daily. 90 tablet 3   ??? hydrOXYchloroQUINE (PLAQUENIL) 200 mg tablet Take by mouth daily with breakfast.     ??? hydrOXYzine HCL (ATARAX) 25 MG tablet 25 mg. Once daily at nighttime     ??? levothyroxine (SYNTHROID) 175 MCG tablet TAKE 1 TABLET BY MOUTH ONCE DAILY. 90 tablet 3   ??? montelukast (SINGULAIR) 10 mg tablet TAKE ONE TABLET BY MOUTH AT BEDTIME. 30 tablet 0   ??? mupirocin (BACTROBAN) 2 % ointment Apply topically 3 times a day. 15 g 3   ??? omeprazole (PRILOSEC) 40 MG capsule Take by mouth. Twice a day     ??? onabotulinumtoxinA (BOTOX COSMETIC) 50 unit SolR Inject into the muscle.     ??? triamcinolone (KENALOG) 0.1 % ointment APPLY 2 TIMES DAILY 80 g 11   ??? blood-glucose meter  Misc Use meter to test blood glucose twice daily 1 each 0   ??? doxycycline (MONODOX) 100 MG capsule Take 1 capsule (100 mg total) by mouth 2 times a day. 30 capsule 1   ??? levalbuterol (XOPENEX) 1.25 mg/3 mL nebulizer solution Inhale 3 mL (1.25 mg total) by nebulization every 6 hours as needed for Wheezing. 72 mL 0   ??? nebulizers (COMPACT COMPRESSOR NEBULIZER) Misc Please dispense 1 nebulizer with tubing and mouthpiece. 1 each 0   ??? TRUE METRIX GLUCOSE TEST STRIP Strp USE TO TEST BLOOD SUGAR 2 TIMES DAILY 100 strip 3   ??? TRUEPLUS LANCETS 30 gauge Misc USE TWICE A DAY AS DIRECTED. 100 each 3   ??? UNABLE TO FIND Rx for Therapeutic Matrress for prevention of migraine d/t cervicogenic trigger from poor muscle alignment of back and neck with regular mattress.  She gets regular physical therapy and it helps somewhat but therapeutic mattress is recommended. She also gets  Botox injections every 3 months but if a therapeutic mattress can help reduce her headache burden, this may be able to be stretched out to every 4 months or so.  She has severe Mast Cell Disorder and being in the office for the injections can flare her Mast Cell and cause severe reactions just from the environment. 1 each 0       Physical Exam:   There were no vitals filed for this visit.   GENERAL APPEARANCE: Well developed, well nourished, in no acute distress, alert and oriented.  HEENT: Normal turbinates, no evidence of swelling or rhinorrhea. No masses, polyps or pus. No septal perforation. Normal external auditory canal, tympanic membrane is gray with normal light reflex.  NECK: Supple and symmetric.  LUNGS: Auscultation of the lungs revealed normal breath sounds, no wheezes or crackles.  CARDIOVASCULAR: Regular rate and rhythm without any murmurs, rubs or gallops.  ABDOMEN: Soft and nontender with normal bowel sounds.  LYMPH NODES:  No lymphadenopathy.  EXTREMITIES:  No cyanosis, clubbing or edema.  NEUROLOGIC: No focal neurological deficits noted.  PSYCH: Appropriate mood and behavior.  SKIN: Inspection of the skin reveals no rashes, ulcerations or petechia.      Assessment:     # Mast cell disorder, diagnosed as mast cell activation syndrome  # Chronic urticaria  # Idiopathic anaphylaxis  Patient has been stable on her current outpatient regimen of diphenhydramine PO 50-150 mg TID, cetirizine 10 mg daily (with additional PM dose as needed), fexofenadine 180 mg nightly, famotidine 40 mg BID, hydroxyzine 25 mg nightly, doxepin 10 mg nightly, cromolyn 200 mg TID, montelukast 10 mg nightly, and hydroxychloroquine, although does report breakthrough symptoms of urticaria, as well as episodes of idiopathic anaphylaxis for which she self-administers epinephrine 1-2x per month. Would recommend continuing patient's outpatient regimen of oral medications. Regarding patient's listed allergies to adhesives/alcohol prep  pads/EKG stickers, her reactions are more likely to be related to her underlying chronic urticaria rather than true anaphylaxis. We do not recommend any additional pre-medication or prophylaxis prior to contact with these materials. It is unclear at this time if patient is to undergo any invasive procedures such as coronary angiogram or stress test while she is here. If she is to undergo any invasive procedure, would recommend pre-medication with standard regimen for patients with MCAS prior to antigen exposure, with prednisone 50 mg PO 13 hours, 7 hours, and 1 hour prior to procedure. If she were to have any procedure in the next 24 hours, would count the dose  of solumedrol that she received 08/28/20 as the 13 hour prior dose, and only administer two additional doses of prednisone. We discussed this case with patient's outpatient allergy/immunology provider Dr. Grover Canavan who agrees with our general plan.    Recommendations:    - Continue home oral antihistamine and mast cell stabilizer regimen   - Diphenhydramine PO 50 mg TID   - Cetirizine 10 mg daily   - Fexofenadine 180 mg nightly   - Famotidine 40 mg BID   - Hydroxyzine 25 mg nightly   - Doxepin 10 mg nightly   - Cromolyn 200 mg TID   - Montelukast 10 mg nightly  - OK for additional dose of antihistamine as needed, such as with urticaria to skin adhesives.  - If patient to undergo any invasive procedure while inpatient, would treat with standard pre-medication regimen for patients with mast cell activation prior to antigen exposure:             - Prednisone 50 mg PO - 13 hours prior to surgery, 7 hours prior and 1 hour prior to surgery.        Allergy and Immunology attending, Dr. Harley Hallmark has reviewed this case with me and interviewed and examined the patient and agrees with the above plan. Our recommendations have been communicated to the primary team.     Thank you for this consultation.    Maryruth Hancock, MD  Allergy & Immunology PGY4  12:22 PM 08/28/2020

## 2020-08-28 NOTE — Unmapped (Signed)
[facility]  Department of Medicine  DIRECT ADMIT CALL NOTE    Location     Clinic    History of Present Illness     Emily Bonilla is a 60 y.o. female with a past medical history of systemic mastocytosis/mast cell activation syndrome, common variable immunodeficiency syndrome, history of cardiac arrest and anaphylaxis, intermittent asthma, GERD, status post cholecystectomy, focal hepatic adenoma, fatty liver, chronic diarrhea, anxiety, depression, hypothyroidism, non-epileptic seizures, hypoparathyroidism, remote history of WPW who was called for direct admission by PCP, Dr. Alpha Gula due to concerns for angina.    Patient presented to clinic today for annual wellness visit. She reports that she has been experiencing shortness of breath with exertion with associated substernal pressure which radiates to the left arm. Has been ongoing since March of 2022.     Patient has significant history of mast cell activation syndrome with severe allergies and history of anaphylaxis.   EKG not obtained in clinic due to reported history of silicone allergy and inability to reach manufacturer to discuss components of EKG lead stickers. Patient is currently chest pain free and asymptomatic. Last chest pain occurred 3 days ago.     Objective     See EMR    Assessment / Plan     Emily Bonilla is a 60 y.o. female being admitted for evaluation of chest pain.  Direct admit accepted to Internal Medicine med/surg floor status.     Will likely need:   - Admission Labs  - Cardiology consult   - EKG if able pending investigation of if EKG stickers have silicone components  - Consider Allergy Consult if needed pending review of allergies   - NPO pending cardiology recommendations     Level of Care: floor    Hazle Quant, DO  08/28/2020 9:13 AM

## 2020-08-28 NOTE — Unmapped (Signed)
Responded as part of the rapid response team to room CVR02/UCVR-02.  Assisted the with the respiratory needs of the patient Emily Bonilla.    Given alb treatment, MD at [facility].

## 2020-08-28 NOTE — Unmapped (Signed)
At approximately 2120, pt began wheezing. Pt reports this was triggered by housekeeping's strong scent of perfume. RN gave 1 Epi pen to patient. Epi pen was successful in stopping wheezing. MD called to bedside to further assess patient. Patient began to wheeze again. Rapid response called to bedside.

## 2020-08-28 NOTE — Unmapped (Signed)
[facility]  Faculty Practice Acute Visit  Subjective:   Pre-visit planning done, including pre-visit huddle with nursing and MA staff prior to session.     Patient ID: Emily Bonilla is a 60 y.o. lady with a history of anxiety and depression, CVID (common variable immunodeficiency), mast cell activation syndrome, obesity, hypothyroidism, hypoparathyroidism, seizures, painful orthopaedic hardware, diabetes mellitus 2, here for an annual wellness visit, which had to be converted to an acute visit for chest pain and dyspnea on exertion.     HPI   Patient states she has been able to lose weight by swimming with her grand-children.  She is on Trulicity and exercising.     Patient states she has been getting substernal chest pressure gets shortness of breath when she climbs stairs after the 16th or 17th step. Has been going on since March 2022, but has been getting worse recently.  No syncopal or presyncopal episodes.  She has a very strong family history of CAD (brief history below).  Patient states she has limited her activity to prevent the episodes of chest pressure and dyspnea.  Last episode 3 days ago.  She also has a history of WPW.  FHx:   Brother: 5x CABG at 27, now has CHF  MGF: died from MI at age 14  MUncle: died from MI at age 43   MAunt: pacer age 81  Other family members further removed with early CAD.    Diabetes mellitus 2 with obesity: currently on dulaglutide 4.5 mg weekly, which she has been taking regularly with no issues.   Lab Results   Component Value Date    HGBA1C 6.1 (H) 08/12/2020    HGBA1C 6.6 04/02/2020    HGBA1C 6.5 (H) 10/09/2019      Hypothyroidism: currently on levothyroxine 175 mcg daily, which she has been taking regularly with no issues.   Lab Results   Component Value Date    TSH 0.91 05/01/2020    T3TOTAL 99.1 11/09/2019    FREET4 0.93 05/01/2020    THYROIDAB 747.0 (H) 06/09/2017      Anxiety and depression: followed and managed by psychiatry (help greatly  appreciated).  Currently on Buspar 20 mg BID, as well as clonazepam 0.5 mg BID prn    Allergies: numerous allergies with anaphylaxis.  Patient currently followed by allergy medicine (help greatly appreciated).  She is on Cetrizine 10 mg daily, as well as astelin 137 mcg nasal spray, pulmicort, as well as Epi-Pen on hand.      Hypertriglyceridemia: on Zetia 10 mg daily, which she has been taking regularly with no issues.   Lab Results   Component Value Date    CHOLTOT 188 08/12/2020    TRIG 204 (H) 08/12/2020    HDL 38 (L) 08/12/2020    LDL 109 08/12/2020      CVID (common variable immunodeficiency):     Mast cell activation syndrome: currently on hydroxychloroquine 200 mg daily, which have reduced her allergic episodes significantly.      Health Maintenance Due   Topic Date Due   ??? Hepatitis C Screening (MyChart)  Never done   ??? Depression Monitoring (PHQ-9)  Never done   ??? Annual Medicare Wellness Visit  Never done   ??? Comprehensive Physical Exam  Never done   ??? Immunization: COVID-19 (1) Never done   ??? Immunization: Pneumococcal (1 - PCV) Never done   ??? HIV Screening  Never done   ??? Immunization: DTaP/Tdap/Td (1 - Tdap) Never done   ???  Immunization: Hepatitis B (1 of 3 - Risk 3-dose series) Never done   ??? Immunization: Zoster (1 of 2) Never done   ??? Colorectal Cancer Screening (MyChart)  Never done   ??? Diabetic Foot Exam (MyChart)  Never done   ??? Mammogram (MyChart)  06/01/2019   ??? Diabetic Eye Exam (MyChart)  11/10/2019   - mammogram  - DM eye  - DM foot  - HCV, HIV  - CRC screening  - Prevnar 20      Depression Screening:  Little interest or pleasure in doing things: Not at all  Feeling down, depressed, or hopeless: Not at all      The following portions of the patient's history were reviewed and updated as appropriate: allergies, current medications, past family history, past medical history, past social history, past surgical history and problem list.        Review of Systems  As above, otherwise negative.    Objective:    Physical Exam  Vitals and nursing note reviewed.   Constitutional:       General: She is not in acute distress.     Appearance: Normal appearance. She is not ill-appearing, toxic-appearing or diaphoretic.   HENT:      Head: Normocephalic and atraumatic.   Cardiovascular:      Rate and Rhythm: Normal rate and regular rhythm.      Pulses: Normal pulses.      Heart sounds: Normal heart sounds. No murmur heard.  Pulmonary:      Effort: Pulmonary effort is normal. No respiratory distress.      Breath sounds: Normal breath sounds. No wheezing or rhonchi.   Neurological:      General: No focal deficit present.      Mental Status: She is alert and oriented to person, place, and time. Mental status is at baseline.      Cranial Nerves: No cranial nerve deficit.      Coordination: Coordination normal.      Gait: Gait normal.   Psychiatric:         Mood and Affect: Mood normal.         Behavior: Behavior normal.         Thought Content: Thought content normal.         Judgment: Judgment normal.       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   Given new chest pain, progressively worsening nature to a point where she has had to limit her activity, and concerning family history for premature CAD, and numerous allergies which will likely require pre-medication prior to IV contrast, will direct admit patient to the [facility] for likely LHC/angiogram.  We were unable to get an ECG as the manufacturer of the adhesives is not open (located in Central Standard Time and will open at 9AM EST) - will need to verify contacts have no silicone.      I spoke to bed-board and our Horticulturist, commercial, as well as the cardiology fellow.  Patient will be evaluated by the cardiology consult team and will likely be able to get an angiogram later today vs tomorrow.  We will keep patient NPO until it is determined if she can get  LHC today vs tomorrow.   - will admit to Internal Medicine with Cardiology consults for further workup and management.   - will need premedication - Benadryl prior to, during and  after procedure.     Inpatient/house staff and attendings are all encouraged to call me with any questions or concerns.   Court Joy, MD     Mast cell activation syndrome: Assessed as improved on current regimen of hydroxychloroquine.    - continue current regimen    CVID - common variable immunodeficiency: stable  - continue current regimen    Diabetes mellitus 2 with obesity: Assessed as improved glycemic control.   - continue dulaglutide 4.5 mg weekly.     Patient understands his/her medication(s). Proper use, potential side effects of these medications,  as well as barriers to using these medications were reviewed and addressed, as were over-the-counter medications and supplements during encounter. Response and side effects to current medications also reviewed.     This note was (at least in part) dictated using Dragon speech recognition software.  I reviewed the document in it's entirety.  Please excuse any errors.       Court Joy, MD, FACP  Associate Professor of Clinical Medicine  [facility]  [facility] [place] [facility]  Department of Internal Medicine  08/28/2020   9:10 AM     *[place] is currently under a State of Emergency for the COVID-19 Pandemic*

## 2020-08-29 ENCOUNTER — Observation Stay: Admit: 2020-08-29 | Payer: PRIVATE HEALTH INSURANCE

## 2020-08-29 DIAGNOSIS — R0600 Dyspnea, unspecified: Principal | ICD-10-CM

## 2020-08-29 LAB — RENAL FUNCTION PANEL W/EGFR
Albumin: 3.8 g/dL (ref 3.5–5.7)
Anion Gap: 8 mmol/L (ref 3–16)
BUN: 10 mg/dL (ref 7–25)
CO2: 23 mmol/L (ref 21–33)
Calcium: 8.8 mg/dL (ref 8.6–10.3)
Chloride: 109 mmol/L (ref 98–110)
Creatinine: 0.71 mg/dL (ref 0.60–1.30)
EGFR: >90
Glucose: 129 mg/dL (ref 70–100)
Osmolality, Calculated: 291 mOsm/kg (ref 278–305)
Phosphorus: 2.9 mg/dL (ref 2.1–4.7)
Potassium: 4.9 mmol/L (ref 3.5–5.3)
Sodium: 140 mmol/L (ref 133–146)

## 2020-08-29 LAB — CBC
Hematocrit: 35.1 % (ref 35.0–45.0)
Hemoglobin: 11.7 g/dL (ref 11.7–15.5)
MCH: 27.2 pg (ref 27.0–33.0)
MCHC: 33.3 g/dL (ref 32.0–36.0)
MCV: 81.9 fL (ref 80.0–100.0)
MPV: 8.5 fL (ref 7.5–11.5)
Platelets: 281 10*3/uL (ref 140–400)
RBC: 4.28 10*6/uL (ref 3.80–5.10)
RDW: 14.3 % (ref 11.0–15.0)
WBC: 13 10*3/uL (ref 3.8–10.8)

## 2020-08-29 LAB — VENOUS BLOOD GAS, LINE/SYRINGE
%HBO2-Line Draw: 92.7 % (ref 40.0–70.0)
Base Excess-Line Draw: -3.9 mmol/L (ref <=3.0)
CO2 Content-Line Draw: 19 mmol/L (ref 25–29)
Carboxyhgb-Line Draw: 1 %
HCO3-Line Draw: 18 mmol/L (ref 24–28)
Methemoglobin-Line Draw: 0.5 % (ref 0.0–1.5)
PCO2-Line Draw: 24 mm Hg (ref 41–51)
PH-Line Draw: 7.48 (ref 7.32–7.42)
PO2-Line Draw: 61 mm Hg (ref 25–40)
Reduced Hemoglobin-Line Draw: 5.8 % (ref 0.0–5.0)

## 2020-08-29 LAB — BASIC METABOLIC PANEL
Anion Gap: 8 mmol/L (ref 3–16)
BUN: 10 mg/dL (ref 7–25)
CO2: 27 mmol/L (ref 21–33)
Calcium: 8.6 mg/dL (ref 8.6–10.3)
Chloride: 109 mmol/L (ref 98–110)
Creatinine: 0.71 mg/dL (ref 0.60–1.30)
EGFR: >90
Glucose: 114 mg/dL (ref 70–100)
Osmolality, Calculated: 298 mOsm/kg (ref 278–305)
Potassium: 3.7 mmol/L (ref 3.5–5.3)
Sodium: 144 mmol/L (ref 133–146)

## 2020-08-29 LAB — MAGNESIUM: Magnesium: 2.1 mg/dL (ref 1.5–2.5)

## 2020-08-29 LAB — HIGH SENSITIVITY TROPONIN: High Sensitivity Troponin: <3 ng/L (ref 0–14)

## 2020-08-29 MED ORDER — fluticasone propionate (FLONASE) 50 mcg/actuation nasal spray 1 spray
50 | Freq: Once | NASAL | Status: AC
Start: 2020-08-29 — End: 2020-08-29
  Administered 2020-08-29: 09:00:00 1 via NASAL

## 2020-08-29 MED ORDER — budesonide (PULMICORT) nebulizer solution 0.5 mg
0.5 | Freq: Two times a day (BID) | RESPIRATORY_TRACT | Status: AC
Start: 2020-08-29 — End: 2020-08-29

## 2020-08-29 MED ORDER — EPINEPHrine (EPI-PEN) 0.3 mg/0.3 mL AtIn injection
0.3 | Freq: Once | INTRAMUSCULAR | 0 refills | 30.00000 days | Status: AC
Start: 2020-08-29 — End: 2020-08-30
  Filled 2020-08-29: qty 1, 1d supply, fill #0

## 2020-08-29 MED FILL — CLONAZEPAM 1 MG TABLET: 1 1 MG | ORAL | Qty: 1

## 2020-08-29 MED FILL — ADRENALIN 1 MG/ML (1 ML) INJECTION SOLUTION: 1 1 mg/mL (1 mL) | INTRAMUSCULAR | Qty: 1

## 2020-08-29 MED FILL — PULMICORT 0.5 MG/2 ML SUSPENSION FOR NEBULIZATION: 0.5 0.5 mg/2 mL | RESPIRATORY_TRACT | Qty: 2

## 2020-08-29 MED FILL — OMEPRAZOLE 40 MG CAPSULE,DELAYED RELEASE: 40 40 MG | ORAL | Qty: 1

## 2020-08-29 MED FILL — HEPARIN (PORCINE) 5,000 UNIT/ML INJECTION SOLUTION: 5000 5,000 unit/mL | INTRAMUSCULAR | Qty: 1

## 2020-08-29 MED FILL — ALBUTEROL SULFATE 2.5 MG/3 ML (0.083 %) SOLUTION FOR NEBULIZATION: 2.5 2.5 mg /3 mL (0.083 %) | RESPIRATORY_TRACT | Qty: 3

## 2020-08-29 MED FILL — FAMOTIDINE 20 MG TABLET: 20 20 MG | ORAL | Qty: 2

## 2020-08-29 MED FILL — DIPHENHYDRAMINE 25 MG CAPSULE: 25 25 mg | ORAL | Qty: 2

## 2020-08-29 MED FILL — FUROSEMIDE 40 MG TABLET: 40 40 MG | ORAL | Qty: 1

## 2020-08-29 NOTE — Unmapped (Signed)
[facility] advised during [facility] team rounds that patient may be ready for discharge today.  No IMM letter needed as patient is in observation status at this time.        Emily Bonilla [facility] Coordinator Assistant  [phone]

## 2020-08-29 NOTE — Unmapped (Signed)
[facility]  Department of Cardiovascular Health and Diseases  General Cardiology Consultation Note    Referring Physician: Rickey Primus, DO    Reason for Consult: CP  Assessment & Recommendations   Emily Bonilla is a 60 y.o. female presenting for cardiac evaluation of CP. Pt has had negative troponin x2 and recent TTE w/o regional WMAs. EKG did show changes from prior (2018); however, these are stable. No concern for ACS. On review of her TTE, she has a severely enlarged RV concerning for pulm HTN. We are concerned that her primary issue is potentially her lungs, especially given her hx of pneumopericardium and pneumomediastinum. Will need further pulm eval w/ CT chest w/o and PFTs. Plan for repeat TTE to assess RV and rule out restrictive/constrictive process. Will pursue outpt CMR for cardiomyopathy eval.     Active Cardiac Problems  #DOE   #CP  -troponin 3 and <3  -EKG has changes from 2018 (T wave inversions in anterior leads) but stable  -review of TTE shows thickened RV so concern for pulm HTN    Chronic Cardiac Problems  #pAF  -CHADS2VASC 1 (previously had A1C>6.5 but not on current treatment, most recent A1C 6.1%)  -no AC indicated at this time    PLAN:  -please order for inpatient a TTE specifying the following:   -RV diastolic assessment   -constriction/restriction testing   -Pulse wave and continuous wave of RVOT and LVOT with respirometer  -please order a CT chest w/o to further evaluate for pulmonary etiology   -please have updated PFTs ordered as an outpatient  -please order outpatient cardiac MRI w/ contrast (patient is agreeable as she has had MRI w/ GAD in the past, please make note she will need pretreatment)    Code Status: Full Code    Patient seen and discussed with the attending cardiologist, Dr. Tiburcio Pea.  Recommendations are considered preliminary until co-signed by the attending cardiologist. Follow up appointments with [facility] can be scheduled by calling  [phone]. Thank you for involving [place] in their care. Please call with any questions.     Hershal Coria, MD  Fellow, Cardiovascular Health and Disease  08/29/2020 at 10:03 AM    Thank you for allowing [place] to participate in the care of your patient.  Please contact us should you have further questions or concerns.  At the conclusion of the encounter, all questions were answered to the patient???s satisfaction.  Comments and concerns were taken and addressed again to the patient???s satisfaction.  After discussion of all of the issues, the patient was able to verbalize and read back understanding of the discussed topics.  After satisfactory read back and comprehension, the encounter concluded.     Encounter Time 45 minutes.    History of Present Illness   62 yr F PMH MDD/GAD, CVID, Mast cell activation syndrome, pre diabetes (A1C 6.1%), pAF (CHADS2VASC 1), hx WPW s/p 2 failed ablations, and obesity presents via her PCP appt for CP that has been ongoing for several months.     Per the pt since March she has had worsening DOE and chest pressure. Thsi si worsened by exertion and has progressed to the point that she has stopped exercises and chores that will cause further exacerbation of her symptoms. She states during these episodes she would experience air hunger and that it would take 20-30 minutes for her to recover.     Pt directly admitted to medicine team and initial evaluation demonstrated neg troponin  x2, EKG w/ subtle changes from prior, and TTE that showed preserved systolic function and no regional WMAs. On review of her echo, we appreciated a severely enlarged RV prompting a concern for pulm HTN and further pulm workup.      All laboratory results were reviewed.   All radiographic images and reports were reviewed.   All laboratory and imaging results were discussed with the patient in detail.    Past Medical History  Past Medical History:   Diagnosis Date   ??? Anaphylaxis    ??? Anemia    ??? Anxiety    ??? Asthma    ???  Atrial fibrillation (CMS Dx)    ??? Bronchitis    ??? Heart disease    ??? Neurological disease    ??? OSA (obstructive sleep apnea)     states resolved   ??? Osteoporosis    ??? Psychogenic nonepileptic seizure    ??? Seizures (CMS Dx)    ??? Spontaneous pneumothorax     states pneumomediatsium/pneumopericardium, self resolved   ??? Thyroid disease    ??? Ulcer    ??? WPW (Wolff-Parkinson-White syndrome)        Past Surgical History  Past Surgical History:   Procedure Laterality Date   ??? BONE MARROW BIOPSY      benign   ??? CARDIAC ELECTROPHYSIOLOGY MAPPING AND ABLATION      attempted x 2, states not successful either time   ??? CHOLECYSTECTOMY  2015   ??? DENTAL EXTRACTION Right 04/25/2017    Procedure: EXTRACTION TOOTH # 31, BRIDGE RESECTIONING;  Surgeon: Gladys Damme, DMD;  Location: [facility] OR;  Service: Oral Surgery;  Laterality: Right;   ??? FACIAL COSMETIC SURGERY     ??? HYSTERECTOMY  2009   ??? INSERTION CATHETER VASCULAR ACCESS N/A 09/06/2017    Procedure: INSERTION POWER PORT A CATH;  Surgeon: Penni Bombard, MD;  Location: [facility] OR;  Service: General;  Laterality: N/A;   ??? LIVER BIOPSY      benign hepatic adednoma per patient   ??? REMOVAL HARDWARE LEG Left 08/17/2016    Procedure: REMOVAL OF HARDWARE LEFT TIBIA;  Surgeon: Andee Lineman, MD;  Location: [facility] OR;  Service: Orthopedics;  Laterality: Left;   ??? TIBIA FRACTURE SURGERY     ??? TONSILLECTOMY      + adenoids        Social History  Social History     Tobacco Use   ??? Smoking status: Never Smoker   ??? Smokeless tobacco: Never Used   Substance Use Topics   ??? Alcohol use: No       Family History  Family History   Problem Relation Age of Onset   ??? Kidney failure Mother    ??? Aneurysm Mother        Allergy:  Allergies   Allergen Reactions   ??? Acetaminophen Anaphylaxis     Unknown   ??? Adhesive Tape-Silicones Itching, Rash, Swelling and Anaphylaxis     Unknown   ??? Alcohol Anaphylaxis   ??? Amitriptyline Anaphylaxis   ??? Aspirin Anaphylaxis     Unknown   ??? Benzoic Acid Anaphylaxis   ??? Blue Dye Anaphylaxis      Unknown   ??? Flecainide Shortness Of Breath and Anaphylaxis     Itching, decreased libido, wheezing, flushing, numbness.    ??? Gabapentin Anaphylaxis   ??? Iodinated Contrast Media Anaphylaxis   ??? Isopropyl Alcohol Anaphylaxis   ??? Latex Anaphylaxis and Rash   ??? Levofloxacin Itching and Anaphylaxis   ???  Lidocaine Anaphylaxis   ??? Nsaids (Non-Steroidal Anti-Inflammatory Drug) Anaphylaxis   ??? Onion Anaphylaxis   ??? 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   ??? Paraben Anaphylaxis   ??? Perfume Anaphylaxis   ??? Pregabalin Anaphylaxis   ??? Preservative Anaphylaxis   ??? Quetiapine Anaphylaxis     Mental Status Change  Other reaction(s): Angioedema   ??? Red Dye Anaphylaxis     Unknown   ??? Sulfa (Sulfonamide Antibiotics) Anaphylaxis   ??? Sulfur Anaphylaxis     Pt states: Any alcohol product   ??? Trazodone Rash and Anaphylaxis   ??? Verapamil Anaphylaxis   ??? Yellow Dye Anaphylaxis     Unknown   ??? Green Dye Rash     Other reaction(s): Contact Dermatitis   ??? Onion Extract Other (See Comments)   ??? Doxepin Other (See Comments)     Sedation and sleep paralysis (side effect)   ??? Duloxetine      Other reaction(s): Other (See Comments)  Mental Status Change   ??? Epi E-Z Pen    ??? Hydroxyzine Itching     Unknown   ??? Statins-Hmg-Coa Reductase Inhibitors    ??? Verapamil (Bulk) Hives     Insomnia, flushing, scattered brain, migraine, itching, anxiety.        Outpatient Medications  Medications Prior to Admission   Medication Sig Dispense Refill Last Dose   ??? azelastine (ASTELIN) 137 mcg (0.1 %) nasal spray Use into each nostril.   08/27/2020 at Unknown time   ??? budesonide (PULMICORT) 0.5 mg/2 mL Take 0.5 mg by mouth.   08/27/2020 at Unknown time   ??? busPIRone  (BUSPAR) 10 MG tablet Take 2 tablets (20 mg total) by mouth in the morning and at bedtime. Indications: Generalized Anxiety Disorder 360 tablet 0 08/28/2020 at Unknown time   ??? cetirizine (ZYRTEC) 10 mg Cap Take 10 mg by mouth daily. 30 capsule 2 08/28/2020 at Unknown time   ??? cholecalciferol, vitamin D3, 125 mcg (5,000 unit) capsule Take 1 capsule (5,000 Units total) by mouth every Monday, Wednesday, and Friday. 40 capsule 3 08/28/2020 at Unknown time   ??? clonazePAM (KLONOPIN) 0.5 MG tablet Take 1 tablet (0.5 mg total) by mouth 2 times a day as needed for Anxiety for up to 60 days. 60 tablet 1 08/28/2020 at Unknown time   ??? cromolyn (GASTROCROM) 100 mg/5 mL solution Take 10 mLs (200 mg total) by mouth 4 times daily before meals and at bedtime. 480 mL 11 08/28/2020 at Unknown time   ??? diphenhydrAMINE (BENADRYL) 25 mg capsule Take 3-6 capsules (75-150 mg total) by mouth daily. 90 capsule 2 08/28/2020 at Unknown time   ??? doxepin (SINEQUAN) 10 MG capsule TAKE ONE (1) CAPSULE BY MOUTH AT BEDTIME. 30 capsule 0 08/27/2020 at Unknown time   ??? dulaglutide 4.5 mg/0.5 mL PnIj Inject 4.5 mg subcutaneously every 7 days. 2 mL 11 Past Week at Unknown time   ??? EPINEPHrine (EPI-PEN) 0.3 mg/0.3 mL AtIn injection Inject into the muscle.   Past Week at Unknown time   ??? ezetimibe (ZETIA) 10 mg tablet TAKE ONE TABLET BY MOUTH EVERY DAY 30 tablet 3 08/27/2020 at Unknown time   ??? famotidine (PEPCID) 40 MG tablet Take 1 tablet (40 mg  total) by mouth 2 times a day. 180 tablet 3 08/28/2020 at Unknown time   ??? fexofenadine (ALLEGRA) 180 MG tablet Take 1 tablet (180 mg total) by mouth daily. 30 tablet 3 08/27/2020 at Unknown time   ??? fluticasone propionate (FLONASE) 50 mcg/actuation nasal spray Use 1 spray into each nostril daily. 16 g 1 08/27/2020 at Unknown time   ??? furosemide (LASIX) 20 MG tablet Take 1 tablet (20 mg total) by mouth daily. 90 tablet 3 08/28/2020 at Unknown time   ??? hydrOXYchloroQUINE (PLAQUENIL) 200 mg tablet Take by mouth daily with  breakfast.   08/28/2020 at Unknown time   ??? hydrOXYzine HCL (ATARAX) 25 MG tablet 25 mg. Once daily at nighttime      ??? levothyroxine (SYNTHROID) 175 MCG tablet TAKE 1 TABLET BY MOUTH ONCE DAILY. 90 tablet 3 08/28/2020 at Unknown time   ??? montelukast (SINGULAIR) 10 mg tablet TAKE ONE TABLET BY MOUTH AT BEDTIME. 30 tablet 0 08/27/2020 at Unknown time   ??? mupirocin (BACTROBAN) 2 % ointment Apply topically 3 times a day. 15 g 3 Past Week at Unknown time   ??? omeprazole (PRILOSEC) 40 MG capsule Take by mouth. Twice a day   08/28/2020 at Unknown time   ??? onabotulinumtoxinA (BOTOX COSMETIC) 50 unit SolR Inject into the muscle.   Past Month at Unknown time   ??? triamcinolone (KENALOG) 0.1 % ointment APPLY 2 TIMES DAILY 80 g 11 Past Week at Unknown time   ??? blood-glucose meter Misc Use meter to test blood glucose twice daily 1 each 0    ??? doxycycline (MONODOX) 100 MG capsule Take 1 capsule (100 mg total) by mouth 2 times a day. 30 capsule 1 More than a month at Unknown time   ??? levalbuterol (XOPENEX) 1.25 mg/3 mL nebulizer solution Inhale 3 mL (1.25 mg total) by nebulization every 6 hours as needed for Wheezing. 72 mL 0 More than a month at Unknown time   ??? nebulizers (COMPACT COMPRESSOR NEBULIZER) Misc Please dispense 1 nebulizer with tubing and mouthpiece. 1 each 0    ??? TRUE METRIX GLUCOSE TEST STRIP Strp USE TO TEST BLOOD SUGAR 2 TIMES DAILY 100 strip 3    ??? TRUEPLUS LANCETS 30 gauge Misc USE TWICE A DAY AS DIRECTED. 100 each 3    ??? UNABLE TO FIND Rx for Therapeutic Matrress for prevention of migraine d/t cervicogenic trigger from poor muscle alignment of back and neck with regular mattress.  She gets regular physical therapy and it helps somewhat but therapeutic mattress is recommended. She also gets Botox injections every 3 months but if a therapeutic mattress can help reduce her headache burden, this may be able to be stretched out to every 4 months or so.  She has severe Mast Cell Disorder and being in the office for the  injections can flare her Mast Cell and cause severe reactions just from the environment. 1 each 0        Inpatient Medications  Scheduled Meds:  ??? budesonide  0.5 mg Nebulization RT BID   ??? busPIRone  20 mg Oral TID   ??? cetirizine  10 mg Oral Daily 0900   ??? cholecalciferol (vitamin D3)  5,000 Units Oral Once per day on Mon Wed Fri   ??? diphenhydrAMINE  50 mg Oral TID   ??? doxepin  10 mg Oral Nightly (2100)   ??? doxycycline  100 mg Oral BID   ??? ezetimibe  10 mg Oral Daily 0900   ??? famotidine  40 mg Oral BID   ??? fexofenadine  180 mg Oral DAILY 2100   ??? fluticasone propionate  1 spray Each Nare Daily 0900   ??? furosemide  20 mg Oral Daily 0900   ??? heparin  5,000 Units Subcutaneous 3 times per day   ??? hydrOXYchloroQUINE  400 mg Oral Nightly (2100)   ??? hydrOXYzine HCL  25 mg Oral Nightly (2100)   ??? levothyroxine  175 mcg Oral Daily 0900   ??? montelukast  10 mg Oral Nightly (2100)   ??? mupirocin   Topical TID   ??? omeprazole  40 mg Oral BID     Continuous Infusions:  PRN Meds:.albuterol, albuterol, clonazePAM, diphenhydrAMINE, diphenhydrAMINE, diphenhydrAMINE, EPINEPHrine, EPINEPHrine, EPINEPHrine, triamcinolone    Review of Systems     ROS   -12 pt ros performed and negative except stated in above HPI    Objective     Temp:  [97 ??F (36.1 ??C)-98 ??F (36.7 ??C)] 97.9 ??F (36.6 ??C)  Heart Rate:  [73-115] 80  Resp:  [13-31] 13  BP: (107-144)/(33-115) 132/76    Intake/Output Summary (Last 24 hours) at 08/29/2020 1003  Last data filed at 08/28/2020 1843  Gross per 24 hour   Intake 360 ml   Output 500 ml   Net -140 ml     I/O last 3 completed shifts:  In: 360 [P.O.:360]  Out: 500 [Urine:500]  Wt Readings from Last 5 Encounters:   08/28/20 203 lb (92.1 kg)   08/28/20 201 lb (91.2 kg)   08/08/20 205 lb 3.2 oz (93.1 kg)   08/04/20 210 lb (95.3 kg)   05/22/20 220 lb 3.2 oz (99.9 kg)     Date 08/28/20 0700 - 08/29/20 0659 08/29/20 0700 - 08/30/20 0659   Shift 0700-1459 1500-2259 2300-0659 24 Hour Total 0700-1459 1500-2259 2300-0659 24 Hour  Total   INTAKE   P.O.  360  360         P.O.  360  360       Shift Total(mL/kg)  360(3.9)  360(3.9)       OUTPUT   Urine(mL/kg/hr)  500(0.7)  500(0.2)         Urine  500  500       Shift Total(mL/kg)  500(5.4)  500(5.4)       Weight (kg) 92.1 92.1 92.1 92.1 92.1 92.1 92.1 92.1         Physical Exam  General: NAD, conversational on RA  Neuro: AAOx3, no gross motor or sensory deficits  Psych: appropriate affect  HEENT: anicteric, EOMI, no jvd appreciated, prior port scars  CV: RRR, S1/S2, systolic murmur best heard in LLSB which increases w/ inspiration  Pulm: CTAB  GI: +bs, soft, non distended, non ttp  GU: defer  Skin: no rashes, lesions, ulcers  Ext: no LE edema, warm to touch    Labs:           Lab 08/29/20  0719 08/28/20  1221   WBC 13.0* 11.0*   HEMOGLOBIN 11.7 12.9   HEMATOCRIT 35.1 39.3   PLATELETS 281 304   MEAN CORPUSCULAR VOLUME 81.9 82.5   INR  --  0.9         Lab 08/29/20  0719 08/29/20  0358 08/28/20  1450 08/28/20  1221   SODIUM 144 140 144 143   POTASSIUM 3.7 4.9 3.7 4.1   CHLORIDE 109 109 108 106   CO2 27 23 26 26    BUN 10 10 8  9  CREATININE 0.71 0.71 0.84 0.85   GLUCOSE 114* 129* 109* 76   CALCIUM 8.6 8.8 8.4* 8.5*   PHOSPHORUS  --  2.9 2.7 4.1   MAGNESIUM  --  2.1 1.9 2.0     No results for input(s): HDL, TRIG in the last 72 hours.    Invalid input(s): CHOL, LDLCALC]    Cardiac Evaluation     EKG: normal EKG, normal sinus rhythm, nonspecific ST and T waves changes.    Last Echocardiogram  Results for orders placed during the hospital encounter of 08/28/20    Echo 2D Complete (TTE)    Narrative  * [facility]*  [address]  [place], [place] [address]  [phone]    Transthoracic Echocardiography    Patient:    Chaelyn, Bunyan  MR #:       [id number]  Account:  Study Date: 08/28/2020  Gender:     F  Age:        38  DOB:        May 08, 1960  Room:       [facility]    PERFORMING   Claretta Fraise, MD  SONOGRAPHER  Macswords, Henrene Pastor, Marissa  REFERRING     Jama, Marissa  ATTENDING    Matesic, Sterling Big    -------------------------------------------------------------------    Procedure:ECHO 2D COMPLETE (TTE)              Order: Accession Number:[id number]    -------------------------------------------------------------------  Indications:      Chest Pain, unspecified.    -------------------------------------------------------------------  Risk factors:  systemic mastocytosis/mast cell activation syndrome,  common variable immunodeficiency syndrome, cardiac arrest and  anaphylaxis, type 2 diabetes mellitus, intermittent asthma, GERD,  focal hepatic adenoma, fatty liver, anxiety, depression,  hashimoto's thyroiditis, non-epileptic seizures, hypoparathyroidism,  and remote history of WPW who was called for direct admission by  PCP, Dr. Alpha Gula, with chest pain concerning for angina.    -------------------------------------------------------------------  Study data:  Height: 67in. 170.2cm. Weight: 200.6lb. 91.2kg. No  prior study was available for comparison.  Study status:  Routine.  Procedure:  Transthoracic echocardiography. Image quality was fair.  Scanning was performed from the parasternal, apical, and subcostal  acoustic windows.          Transthoracic echocardiography. M-mode,  complete 2D, 3D, strain, complete spectral Doppler, and color  Doppler.  Birthdate:  Patient birthdate: Jun 17, 1960.  Age:  Patient  is 60yr old.  Sex:  Birth gender: female.  Body mass index:  BMI:  31.5kg/m^2.  Body surface area:    BSA: 2.93m^2.  Blood pressure:  143/74  Patient status:  Inpatient.  Study date:  Study date:  08/28/2020. Study time: 02:01 PM.  Location:  Bedside.    -------------------------------------------------------------------  Study Conclusions    - Left ventricle: The cavity size is normal. Wall thickness was increased in a pattern of mild LVH.  Systolic function was normal. Wall motion was normal; there were no regional wall  motion  abnormalities. Doppler parameters are consistent with abnormal left ventricular relaxation (grade  1 diastolic dysfunction).  - Mitral valve: Mild regurgitation.  - Left atrium: The atrium is mildly dilated.  - Right ventricle: Systolic function was normal.  - Pericardium, extracardiac: A trivial pericardial effusion is identified. Features were not  consistent with tamponade physiology.    Impressions:  No previous study was available for comparison.    -------------------------------------------------------------------  Cardiac Anatomy  Left ventricle:    - The cavity size is normal. Wall thickness was increased in a pattern of mild LVH. Systolic  function was normal. Wall motion was normal; there were no regional wall motion abnormalities.    - Wall motion score: 1.00.    - Doppler parameters are consistent with abnormal left ventricular relaxation (grade 1 diastolic  dysfunction).    Aorta:   Aortic root:    - The aortic root is normal in size.    Aortic valve:    - Trileaflet; normal thickness leaflets. Mobility was not restricted.    Doppler:    - Transvalvular velocity is within the normal range. There is no stenosis. No regurgitation.    Mitral valve:    - Structurally normal valve. Mobility was not restricted.    Doppler:    - Transvalvular velocity is within the normal range. There is no evidence for stenosis. Mild  regurgitation.    Left atrium:    - The atrium is mildly dilated.    Pulmonary artery:    - The main pulmonary artery was normal-sized. Systolic pressure could not be accurately estimated.    Systemic veins:  Inferior vena cava: The vessel was normal in size. The  respirophasic diameter changes were in the normal range (>= 50%),  consistent with normal central venous pressure.  Right ventricle:    - The cavity size is normal. Wall thickness is normal. Systolic function was normal.    Pulmonic valve:    Doppler:    - Transvalvular velocity is within the normal range. There is no  evidence for stenosis. No  regurgitation.    Tricuspid valve:    - Structurally normal valve.    Doppler:    - Transvalvular velocity is within the normal range. Trivial regurgitation.    Right atrium:    - The atrium is at the upper limits of normal in size.    Pericardium:    - A trivial pericardial effusion is identified.    Doppler:  Features were not consistent with tamponade physiology.  Systemic veins:  Inferior vena cava:    - The vessel was normal in size. The respirophasic diameter changes were in the normal range (>=  50%), consistent with normal central venous pressure.  -    -------------------------------------------------------------------  Measurements    Left ventricle                  Value           Ref  GLS, 2D                         -17.40 %        ----------  EDD, LAX               (N)      4.8    cm       3.8 - 5.2  ESD, LAX               (N)      3.3    cm       2.2 - 3.5  EDD/bsa, LAX           (N)      2.4    cm/m^2   2.3 - 3.1  ESD/bsa, LAX           (N)      1.6    cm/m^2   1.3 -  2.1  FS, LAX                (N)      31     %        27 - 45  FS, LAX chord          (N)      31     %        27 - 45  ESD major ax, A4C               6.7    cm       ----------  ESD/bsa major ax, A4C           3.3    cm/m^2   ----------  EDD minor ax, A4C               6.7    cm       ----------  EDD/bsa minor ax, A4C           3.3    cm/m^2   ----------  EDA, A4C                        26.5   cm^2     ----------  ESA, A4C                        14.9   cm^2     ----------  FAC, A4C                        44     %        ----------  EDD major ax, A2C               8.3    cm       ----------  EDD/bsa major ax, A2C           4.1    cm/m^2   ----------  EDA, A2C                        28.9   cm^2     ----------  ESA, A2C                        14.7   cm^2     ----------  FAC, A2C                        49     %        ----------  ESD                    (N)      3.3    cm       2.2 - 3.5  ESD/bsa                (N)       1.6    cm/m^2   1.3 - 2.1  PW, ED                 (H)      1.0    cm       0.6 - 0.9  IVS/PW, ED                      1               ----------  EDV                    (H)      108    ml       46 - 106  ESV                    (H)      44     ml       14 - 42  EF                     (N)      59     %        54 - 74  SV                              49     ml       ----------  EDV/bsa                (N)      53     ml/m^2   29 - 61  ESV/bsa                (N)      22     ml/m^2   8 - 24  SV/bsa                          24     ml/m^2   ----------  EDV, 1-p A2C           (N)      83     ml       41 - 133  ESV, 1-p A2C           (N)      53     ml       10 - 54  EF, 1-p A2C            (N)      64     %        52 - 76  SV, 1-p A2C                     64     ml       ----------  EDV/bsa, 1-p A2C       (N)      41     ml/m^2   26 - 74  ESV/bsa, 1-p A2C       (N)      26     ml/m^2   6 - 30  SV/bsa, 1-p A2C                 31.5   ml/m^2   ----------  EDV, 1-p A4C           (N)      71     ml       48 - 140  ESV, 1-p A4C           (N)      31     ml       12 - 60  EF, 1-p A4C            (N)      56     %  46 - 78  SV, 1-p A4C                     75     ml       ----------  EDV/bsa, 1-p A4C       (N)      35     ml/m^2   30 - 82  ESV/bsa, 1-p A4C       (N)      15     ml/m^2   7 - 35  SV/bsa, 1-p A4C                 37     ml/m^2   ----------  EDV, 2-p               (N)      77     ml       46 - 106  ESV, 2-p               (N)      31     ml       14 - 42  EF, 2-p                (N)      60     %        54 - 74  SV, 2-p                         46     ml       ----------  EDV/bsa, 2-p           (N)      38     ml/m^2   29 - 61  ESV/bsa, 2-p           (N)      15     ml/m^2   8 - 24  SV/bsa, 2-p                     19.6   ml/m^2   ----------  E', lat ann, TDI       (L)      5.1    cm/sec   >=10.0  E/e', lat ann, TDI              13              ----------  A', lat ann, TDI                9.0    cm/sec    ----------  E'/a', lat ann, TDI             0.57            ----------  S', lat ann, TDI                7.0    cm/sec   ----------  E', med ann, TDI       (N)      37.5   cm/sec   >=7.0  E/e', med ann, TDI              2               ----------  A', med ann, TDI                10.7   cm/sec   ----------  E'/a', med ann, TDI             3.5             ----------  S', med ann, TDI                8.1    cm/sec   ----------  E', avg, TDI                    21.3   cm/sec   ----------  E/e', avg, TDI         (N)      3               <=14    LVOT                            Value           Ref  Diam, S                         2.0    cm       ----------  Area                            3.1    cm^2     ----------  Peak vel, S                     0.91   m/sec    ----------  Peak grad, S                    3      mm Hg    ----------  SV                              49     ml       ----------  SV/bsa                          24     ml/m^2   ----------    Ventricular septum              Value           Ref  IVS, ED                (H)      1.0    cm       0.6 - 0.9    Right ventricle                 Value           Ref  EDD minor ax, A4C base (L)      2.4    cm       2.5 - 4.1  TAPSE, MM              (N)      2.4    cm       1.7 - 3.1  S' lateral             (N)      11.3   cm/sec   6.0 - 13.4    RVOT  Value           Ref  Peak v, S                       0.66   m/sec    ----------    Left atrium                     Value           Ref  AP dim, ES             (H)      4.0    cm       2.7 - 3.8  AP dim index           (N)      2.0    cm/m^2   1.5 - 2.3  Area ES, A4C           (N)      17     cm^2     <=20  Area ES, A2C                    16     cm^2     ----------  SI dim, A2C                     4.6    cm       ----------  Vol, ES, 1-p A4C       (N)      45     ml       22 - 52  Vol/bsa, ES, 1-p A4C   (N)      22     ml/m^2   11 - 40  Vol, ES, 1-p A2C       (N)      49     ml       22 - 52  Vol/bsa, ES,  1-p A2C   (N)      24     ml/m^2   13 - 40  Vol, ES, 2-p                    51     ml       ----------  Vol/bsa, ES, 2-p       (N)      25     ml/m^2   16 - 34    Right atrium                    Value           Ref  Area, ES, A4C          (L)      7      cm^2     10 - 18    Aortic valve                    Value           Ref  Peak v, S                       1.7    m/sec    ----------  Peak grad, S                    11     mm Hg    ----------  LVOT/AV, Vpeak ratio            0.54            ----------  AVA, Vmax                       1.7    cm^2     ----------  AVA/bsa, Vmax                   0.84   cm^2/m^2 ----------    Mitral valve                    Value           Ref  Peak E                          0.67   m/sec    ----------  Peak A                          1.04   m/sec    ----------  Decel slope                     256    cm/s^2   ----------  Decel time                      263    ms       ----------  PHT                             77     ms       ----------  Peak E/A ratio                  0.6             ----------  MVA, PHT                        2.9    cm^2     ----------  MVA/bsa, PHT                    1.41   cm^2/m^2 ----------    Pulmonic valve                  Value           Ref  Peak v, S                       0.7    m/sec    ----------    Aortic root                     Value           Ref  Root diam              (N)      3.3    cm       <4.2  S-T junct diam, ED     (N)      2.6    cm       2.0 - 3.2  S-T junct diam/bsa, ED (N)      1.3    cm/m^2   1.1 - 1.9    Ascending aorta  Value           Ref  AAo AP diam, S                  3.1    cm       ----------  AAo AP diam/bsa, S              1.5    cm/m^2   ----------    Inferior vena cava              Value           Ref  Diam                            1.5    cm       ----------    Legend:  (L)  and  (H)  mark values outside specified reference range.    (N)  marks values inside specified reference range.    Reviewed and confirmed  by    Doreene Eland, MD  2022-08-11T16:30:45      Last Stress test:  No results found for this or any previous visit.       Last Cardiac Catheterization  No results found for this or any previous visit.      Last Cardiac MRI  No results found for this or any previous visit.      Emergency Contact Information     Emergency Contact: Extended Emergency Contact Information  Primary Emergency Contact: Cephus Richer [place]  Mobile Phone: [phone]  Relation: Daughter

## 2020-08-29 NOTE — Unmapped (Signed)
D/C instructions reviewed with patient. Written copy given. Patient verbalized understanding. Medication delivered per pharmacy. PIV removed. Patient stated she is calling an uber to take her home. Patient agreed to go to [facility] to wait for ride. Patient pending transport for D/C.

## 2020-08-29 NOTE — Unmapped (Signed)
Pt D/C with all belongings.

## 2020-08-29 NOTE — Unmapped (Signed)
[facility]  Case Management/Social Work Department Brief Assessment    Re-Admission within 30 days     N/A    Patient Information   Admission diagnosis: Dyspnea on exertion   Demographic verified and updated as needed    Support Tour manager (POA or Next of Kin):  Baxter Kail    Phone #: [phone]   Relationship: Daughter     Living Arrangements Prior to Hospitalization      Resides in independent living @ [facility]. Total independence in all ADLs. Active with [facility] as well. Has life alert. Prepares her own meals and utilizes her [place] tx.       Community Resources Prior to Hospitalization     N/A    Discharge Plan   Anticipated discharge plan: Will discharge today after tests are completed and read. Will return to [facility]. Able to call [facility] transport on her own for ride home.     Anticipated discharge date: 8/12    Transportation at Discharge: [facility]    Please contact CM/SW for any further discharge planning needs.    Patient/Family aware and taking part in the discharge plan.  Patient/family educated that once post-acute care needs have been identified, a provider list applicable to the identified post-acute care needs as well as the insurance provider will be provided, and patient/family have the freedom to choose their provider(s); financial interest(s) are disclosed as appropriate.     Please contact SW should additional needs arise.     Janit Pagan, MSW, [facility]  Care Coordination  Phone: [phone]

## 2020-08-29 NOTE — Unmapped (Signed)
[facility]  Department of Internal Medicine  Inpatient Discharge Summary    Patient: Emily Bonilla   MRN: [id number]   CSN: [id number]    Date of Admission: 08/28/2020  Date of Discharge: 08/29/2020  Attending Physician: Harrison Mons Matesic, DO     Diagnoses Present on Admission     Past Medical History:   Diagnosis Date   ??? Anaphylaxis    ??? Anemia    ??? Anxiety    ??? Asthma    ??? Atrial fibrillation (CMS Dx)    ??? Bronchitis    ??? Heart disease    ??? Neurological disease    ??? OSA (obstructive sleep apnea)     states resolved   ??? Osteoporosis    ??? Psychogenic nonepileptic seizure    ??? Seizures (CMS Dx)    ??? Spontaneous pneumothorax     states pneumomediatsium/pneumopericardium, self resolved   ??? Thyroid disease    ??? Ulcer    ??? WPW (Wolff-Parkinson-White syndrome)       Discharge Diagnoses     Active Hospital Problems    Diagnosis Date Noted   ??? Dyspnea on exertion [R06.00] 08/29/2020      Resolved Hospital Problems   No resolved problems to display.       Operations/Procedures Performed (include dates)     Surgeries: None        Lines/Drains/Airways:  Patient Lines/Drains/Airways Status     Active Line / PIV Line     Name Placement date Placement time Site Days    Port A Cath 05/25/18 Right Chest 05/25/18  1526  Chest  827    Peripheral IV 08/28/20 Posterior;Right Hand 08/28/20  1122  Hand  1                Notable Imaging Studies:  X-ray Portable Chest    Result Date: 08/28/2020  IMPRESSION: No acute cardiopulmonary disease. No significant change compared to 09/06/2017. Report Verified by: Glean Salvo, MD at 08/28/2020 12:55 PM EDT    TTE 08/28/20:   LVEF systolic function normal  G1DD  Mild mitral valve regurg  Trivial pericardial effusion without tamponade physiology  Mild left atrial dilation  RV thickening    CT Chest 08/29/20:    TTE 08/29/20 to evaluate for RV Disease for constriction or restiction    Other Procedures:  EKG: NSR with lateral ST depressions in V2, V3, V4, mildy worse from previous,  but stable during admission      Consulting Services (include reason)     Cardiology    Allergies     Acetaminophen  Adhesive Tape-Silicones  Alcohol  Amitriptyline  Aspirin  Benzoic Acid  Blue Dye  Flecainide  Gabapentin  Iodinated Contrast Media  Isopropyl Alcohol  Latex  Levofloxacin  Lidocaine  Nsaids (Non-Steroidal Anti-Inflammatory Drug)  Onion  Other  Paraben  Perfume  Pregabalin  Preservative  Quetiapine  Red Dye  Sulfa (Sulfonamide Antibiotics)  Sulfur  Trazodone  Verapamil  Yellow Dye  Green Dye  Onion Extract  Doxepin  Duloxetine  Epi E-Z Pen  Hydroxyzine  Statins-Hmg-Coa Reductase Inhibitors  Verapamil (Bulk)    Discharge Medications     Current Discharge Medication List      START taking these medications    Details   !! EPINEPHrine (EPI-PEN) 0.3 mg/0.3 mL AtIn injection Inject 0.3 mLs (0.3 mg total) into the muscle once for 1 dose.  Qty: 1 each, Refills: 0       !! -  Potential duplicate medications found. Please discuss with provider.      CONTINUE these medications which have NOT CHANGED    Details   azelastine (ASTELIN) 137 mcg (0.1 %) nasal spray Use into each nostril.      budesonide (PULMICORT) 0.5 mg/2 mL Take 0.5 mg by mouth.      busPIRone (BUSPAR) 10 MG tablet Take 2 tablets (20 mg total) by mouth in the morning and at bedtime. Indications: Generalized Anxiety Disorder  Qty: 360 tablet, Refills: 0      cetirizine (ZYRTEC) 10 mg Cap Take 10 mg by mouth daily.  Qty: 30 capsule, Refills: 2    Associated Diagnoses: Mixed rhinitis      cholecalciferol, vitamin D3, 125 mcg (5,000 unit) capsule Take 1 capsule (5,000 Units total) by mouth every Monday, Wednesday, and Friday.  Qty: 40 capsule, Refills: 3    Associated Diagnoses: Hyperparathyroidism (CMS Dx); Vitamin D deficiency      clonazePAM (KLONOPIN) 0.5 MG tablet Take 1 tablet (0.5 mg total) by mouth 2 times a day as needed for Anxiety for up to 60 days.  Qty: 60 tablet, Refills: 1    Associated Diagnoses: Panic disorder      cromolyn  (GASTROCROM) 100 mg/5 mL solution Take 10 mLs (200 mg total) by mouth 4 times daily before meals and at bedtime.  Qty: 480 mL, Refills: 11    Associated Diagnoses: Mixed rhinitis      diphenhydrAMINE (BENADRYL) 25 mg capsule Take 3-6 capsules (75-150 mg total) by mouth daily.  Qty: 90 capsule, Refills: 2    Associated Diagnoses: Mixed rhinitis      doxepin (SINEQUAN) 10 MG capsule TAKE ONE (1) CAPSULE BY MOUTH AT BEDTIME.  Qty: 30 capsule, Refills: 0    Associated Diagnoses: Mixed rhinitis      dulaglutide 4.5 mg/0.5 mL PnIj Inject 4.5 mg subcutaneously every 7 days.  Qty: 2 mL, Refills: 11    Associated Diagnoses: Type 2 diabetes mellitus without complication, without long-term current use of insulin (CMS Dx); Familial hypercholesterolemia      !! EPINEPHrine (EPI-PEN) 0.3 mg/0.3 mL AtIn injection Inject into the muscle.      ezetimibe (ZETIA) 10 mg tablet TAKE ONE TABLET BY MOUTH EVERY DAY  Qty: 30 tablet, Refills: 3    Associated Diagnoses: Type 2 diabetes mellitus without complication, without long-term current use of insulin (CMS Dx); Familial hypercholesterolemia      famotidine (PEPCID) 40 MG tablet Take 1 tablet (40 mg total) by mouth 2 times a day.  Qty: 180 tablet, Refills: 3    Associated Diagnoses: Chronic GERD      fexofenadine (ALLEGRA) 180 MG tablet Take 1 tablet (180 mg total) by mouth daily.  Qty: 30 tablet, Refills: 3    Associated Diagnoses: Mixed rhinitis      fluticasone propionate (FLONASE) 50 mcg/actuation nasal spray Use 1 spray into each nostril daily.  Qty: 16 g, Refills: 1    Associated Diagnoses: Mixed rhinitis      furosemide (LASIX) 20 MG tablet Take 1 tablet (20 mg total) by mouth daily.  Qty: 90 tablet, Refills: 3      hydrOXYchloroQUINE (PLAQUENIL) 200 mg tablet Take by mouth daily with breakfast.      hydrOXYzine HCL (ATARAX) 25 MG tablet 25 mg. Once daily at nighttime      levothyroxine (SYNTHROID) 175 MCG tablet TAKE 1 TABLET BY MOUTH ONCE DAILY.  Qty: 90 tablet, Refills: 3     Associated Diagnoses: Hypothyroidism due  to Hashimoto's thyroiditis      montelukast (SINGULAIR) 10 mg tablet TAKE ONE TABLET BY MOUTH AT BEDTIME.  Qty: 30 tablet, Refills: 0    Associated Diagnoses: Mixed rhinitis      mupirocin (BACTROBAN) 2 % ointment Apply topically 3 times a day.  Qty: 15 g, Refills: 3      omeprazole (PRILOSEC) 40 MG capsule Take by mouth. Twice a day      onabotulinumtoxinA (BOTOX COSMETIC) 50 unit SolR Inject into the muscle.      triamcinolone (KENALOG) 0.1 % ointment APPLY 2 TIMES DAILY  Qty: 80 g, Refills: 11    Associated Diagnoses: Allergic contact dermatitis due to metals      blood-glucose meter Misc Use meter to test blood glucose twice daily  Qty: 1 each, Refills: 0    Comments: Please fill with whatever insurance will cover, and test strips to match      doxycycline (MONODOX) 100 MG capsule Take 1 capsule (100 mg total) by mouth 2 times a day.  Qty: 30 capsule, Refills: 1      levalbuterol (XOPENEX) 1.25 mg/3 mL nebulizer solution Inhale 3 mL (1.25 mg total) by nebulization every 6 hours as needed for Wheezing.  Qty: 72 mL, Refills: 0    Associated Diagnoses: Mild intermittent asthma without complication      nebulizers (COMPACT COMPRESSOR NEBULIZER) Misc Please dispense 1 nebulizer with tubing and mouthpiece.  Qty: 1 each, Refills: 0    Associated Diagnoses: Mild intermittent asthma without complication      TRUE METRIX GLUCOSE TEST STRIP Strp USE TO TEST BLOOD SUGAR 2 TIMES DAILY  Qty: 100 strip, Refills: 3    Associated Diagnoses: Type 2 diabetes mellitus without complication, without long-term current use of insulin (CMS Dx)      TRUEPLUS LANCETS 30 gauge Misc USE TWICE A DAY AS DIRECTED.  Qty: 100 each, Refills: 3      UNABLE TO FIND Rx for Therapeutic Matrress for prevention of migraine d/t cervicogenic trigger from poor muscle alignment of back and neck with regular mattress.  She gets regular physical therapy and it helps somewhat but therapeutic mattress is recommended.  She also gets Botox injections every 3 months but if a therapeutic mattress can help reduce her headache burden, this may be able to be stretched out to every 4 months or so.  She has severe Mast Cell Disorder and being in the office for the injections can flare her Mast Cell and cause severe reactions just from the environment.  Qty: 1 each, Refills: 0    Associated Diagnoses: Intractable chronic migraine without aura and without status migrainosus       !! - Potential duplicate medications found. Please discuss with provider.          Reason for Admission     Lorette Peterkin is a 60 y.o. 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    Hospital Course     Active Hospital Problems    Diagnosis Date Noted   ??? Dyspnea on exertion [R06.00] 08/29/2020      Resolved Hospital Problems   No resolved problems to display.     #Chronic dyspnea on exertion with associated chest  pressure  #Paroxysmal atrial fibrillation (not on anticoagulation)  #Mitral regurgitation   #Hx of WPW (remote history of unsuccessful ablation x2)  Patient presented from PCP clinic due to several months of dyspnea on exertion. She has a significant allergy history, especially with adhesives, alcohol wipes, perfumes, and plastics. Given this, she was admitted to [facility] directly for chest pain evaluation under monitoring for allergic reactions. CXR negative. Troponins 3 and <3 on repeat. She did not have any episodes of chest pain. She was pre-treated with IV benadryl and solumedrol for EKG adhesive strips and EKG was collected without episodes of urticaria or  anaphylaxis. EKG showed V2, V3, and V4 ST depressions that appear similar to previous EKG from Jun 09, 2016, but perhaps more pronounced. TTE showed normal EF with G1DD, asymmetric RV hypertrophy when compared to LV. Given this, cardiology was consulted and recommended monitoring. Cardiology was concerned for underlying autoimmune component for cardiac muscle changes with constrictive vs restrictive pathology. Given her allergy risks, they stated the repeat echo with RV diagnostic testing would be safer than catheterization. Additionally they recommended non-con CT chest to evaluate for underlying lung pathology and cardiac MRI with contrast as outpatient. She completed repeat TTE and CT chest prior to discharge. She did not experience adverse reactions to EKG or CT scan procedure. She will follow-up with cardiology as outpatient and will obtain a cardiac MRI and PFTs for further evaluation of cardiac disease.  ??  #Systemic mastocytosis/mast cell activation syndrome  #Chronic urticaria  #Idiopathic anaphylaxis  #Hx of CVID  Patient with a significant history of allergic reaction and anaphylaxis to hospital based products. Follows with Dr. Grover Canavan at [facility]. She stated that her condition was under remarkable control on admission and was denying significant reaction on the time of admission.  High risk triggers were discussed with patient and all healthcare staff were alerted of needs for patient safety. Betadine was used in place of alcohol based skin products. Documented allergies to aspirin, IV contrast and additional anti-hypertensive medications.     Current home medication regimen has been successful at controlling the majority of her symptoms outpatient. Current outpatient regimen: diphenhydramine PO 50-150 mg TID, cetirizine 10 mg daily (with additional PM dose as needed), fexofenadine 180 mg nightly, famotidine 40 mg BID, hydroxyzine 25 mg nightly, doxepin 10 mg nightly, cromolyn 200 mg TID, montelukast  10 mg nightly, and hydroxychloroquine. States that while on this regimen, her use of epinephrine has decreased from several times per week, to 1-2 times per month.     Allergy/Immunology was consulted on admission for assistance. They stated that patient does have legitimate triggers but anaphylaxis seems to be a strange reaction for several of her triggers. Likely many of her symptoms may better be described by urticaria. Overnight hospital staff entered room while she was sleeping and she started having presumed anaphylactic reaction to staff's perfume requiring epi-pen use. Rapid response was called due to failure of 2nd epi-pen to administer. She received albuterol nebs for wheezing. She additionally required solumedrol and IV benadryl. She returned to baseline quickly. ENT was consulted and found no acute issues. She had no further episodes of anaphylaxis during the admission and was discharged with plan to follow-up with allergy to develop better plan in future for future healthcare needs and potential cardiac medication needs based on many reported allergies to cardiac meds.   ??  #T2DM  Managed on LDSSI while admitted    #GERD  Continued home famotidine and omeprazole  ??  #Hypothyroidism  Continued home levothyroxine 175 mcg daily  ??  #Anxiety & Depression  Continued home Buspar 20mg  BID and Conazepam 0.5mg  BID PRN  ??  #Hypertriglyceridemia  Continued home Zetia 10mg  daily      Condition on Discharge     1. Functional Status: mildly impaired   Describe limitations, if any: dyspnea on exertion of 60 yards or more    2. Mental Status: Alert/Oriented   Describe limitations, if any: none    3. Diet / Tube Feeding / TPN:  Diet Orders  Report         Diet regular starting at 08/11 1147        As listed above    4. Respiratory / Lines & Tubes / Wounds:  None required    5. Discharge Physical Exam:  BP 120/76    Pulse 68    Temp 97.9 ??F (36.6 ??C) (Oral)    Resp 18    Ht 5' 7 (1.702 m)    Wt 203 lb (92.1 kg)    LMP   (LMP Unknown)    SpO2 97%    BMI 31.79 kg/m??      Physical Exam  Constitutional:       General: She is not in acute distress.     Appearance: Normal appearance. She is not ill-appearing.   HENT:      Head: Normocephalic and atraumatic.      Right Ear: External ear normal.      Left Ear: External ear normal.      Nose: Nose normal.      Mouth/Throat:      Mouth: Mucous membranes are moist.      Pharynx: Oropharynx is clear.   Eyes:      General: No scleral icterus.     Conjunctiva/sclera: Conjunctivae normal.   Cardiovascular:      Rate and Rhythm: Normal rate and regular rhythm.      Pulses: Normal pulses.      Heart sounds: Normal heart sounds.   Pulmonary:      Effort: Pulmonary effort is normal.      Breath sounds: Normal breath sounds. No wheezing, rhonchi or rales.   Abdominal:      General: Abdomen is flat. Bowel sounds are normal. There is no distension.      Palpations: Abdomen is soft.      Tenderness: There is no abdominal tenderness.   Musculoskeletal:         General: No swelling or deformity. Normal range of motion.      Cervical back: Normal range of motion and neck supple.   Skin:     General: Skin is warm and dry.      Findings: Lesion and rash (Chronic urticarial lesions on arms, stable, unchanged this admission) present.   Neurological:      General: No focal deficit present.      Mental Status: She is oriented to person, place, and time.      Motor: No weakness.   Psychiatric:         Mood and Affect: Mood normal.         Behavior: Behavior normal.         Thought Content: Thought content normal.           Core Measure Documentation (As Applicable)     Most Recent Wt: Weight: 203 lb (92.1 kg)      Disposition     Home with assistance  Follow-Up Appointments     Future Appointments   Date Time Provider Department Center   09/11/2020  1:45 PM Nicholaus Bloom, MD [facility] PS[facility] BUR BUR   09/18/2020  8:30 AM Orbie Pyo, MD [facility] MAB MAB   10/31/2020  2:00 PM Stefan Church, CNP [facility] NEUR GNI UCGNI    11/10/2020 10:45 AM Glenna Fellows, MD [facility] GAST [facility]         Patient Instructions / Follow-Up Items for Receiving Physician     1. Follow up with cardiology as outpatient  2. Obtain Cardiac MRI with contrast, require pre-treatment  3. Obtain PFTs as outpatient  3. Follow-up with allergy regarding future treatment plans.    Signed:    Belynda Pagaduan, DO   08/29/2020, 5:12 PM

## 2020-08-29 NOTE — Unmapped (Signed)
Department of Internal Medicine  Daily Progress Note  Emily Bonilla  08/29/2020    Chief Complaint / Reason for Follow-Up     Dyspnea on exertion       Interval History / Subjective     Overnight with rapid response called related to triggered anaphylaxis from possible perfume inhalation and subsequent IV fluid administration. Epinephrine x3 administered in total, albuterol nebulizer with relief of symptoms. This morning she has sinus congestion however feels well otherwise. No additional episodes of anaphylaxis. Earlier in the evening yesterday had mild chest discomfort while at rest prior to the episode of anaphylaxis, resolved spontaneously. No additional concerns this morning.    Review of Systems (Focused)     Endorses stable tinnitus, sinus congestion since yesterday evening, intermittent chest discomfort while at rest    Denies fever, chills, headache, shortness of breath, abdominal pain or rashes     Physical Exam     Temp:  [97 ??F (36.1 ??C)-98 ??F (36.7 ??C)] 97.9 ??F (36.6 ??C)  Heart Rate:  [73-115] 80  Resp:  [13-31] 13  BP: (107-144)/(33-115) 132/76    Physical Exam  Constitutional:       General: She is not in acute distress.     Appearance: Normal appearance. She is not ill-appearing.   HENT:      Head: Normocephalic and atraumatic.      Mouth/Throat:      Mouth: Mucous membranes are moist.      Pharynx: Oropharynx is clear.   Eyes:      Conjunctiva/sclera: Conjunctivae normal.   Cardiovascular:      Rate and Rhythm: Normal rate and regular rhythm.      Pulses: Normal pulses.      Heart sounds: Normal heart sounds.      Comments: Telemetry with normal sinus rhythm  Pulmonary:      Effort: Pulmonary effort is normal.      Breath sounds: Normal breath sounds. No wheezing.   Abdominal:      General: Abdomen is flat.      Palpations: Abdomen is soft.      Tenderness: There is no abdominal tenderness.   Musculoskeletal:         General: Normal range of motion.      Cervical back: Normal range of motion and  neck supple.   Skin:     General: Skin is warm and dry.      Findings: Lesion (Stable lesions on forearms seen on admission PE) present. No rash.   Neurological:      General: No focal deficit present.      Mental Status: She is oriented to person, place, and time.   Psychiatric:         Mood and Affect: Mood normal.         Behavior: Behavior normal.         Diagnostic Studies     Labs: I have personally reviewed labs. No anemia. Mild leukocytosis in chronic systemic inflammatory conditions without clinical features of infection. Platelets normal. Electrolytes normal. Renal function normal. Mild hyperglycemia in setting of known diabetes. All troponins negative.     No new imaging studies to review.    Assessment & Plan     Emily Bonilla is a 60 y.o. 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 Alpha Gula with chronic onset exertional dyspnea with concern for angina admitted for observation and further cardiac workup.    #Chronic dyspnea on exertion with associated chest pressure  #Paroxysmal atrial fibrillation (not on anticoagulation)  #Mitral regurgitation   #Hx of WPW (remote history of unsuccessful ablation x2)  Differential includes autoimmune pericarditis vs pulmonary HTN. Unlikely to be CAD with ischemia. Direct admission in the setting of complex comorbid hypersensitivities. Initial EKG with mild T wave changes compared to prior, troponins negative x2. CXR without acute cardiopulmonary disease. TTE with normal systolic function, grade I diastolic dysfunction, mild LVH, no regional wall motion abnormalities, mild LA  dilation, mild MR and trace pericardial effusion without evidence of tamponade. No anemia. No coagulopathy. Has been in normal sinus rhythm for duration of her admission.  Plan:  - Cardiology consulted, appreciate recommendations   - TTE to assess RV diastolic function, constriction/restriction testing and pulse wave and continuous wave of RVOT and LVOT with respirometer today   - CT chest without contrast today to evaluate for pulmonary etiology   - Coordination of outpatient PFTs and MRI with gadolinium with proper pre-treatment   - No anticoagulation at this time  - Continuous telemetry   ??  #Systemic mastocytosis/mast cell activation syndrome  #Chronic urticaria  #Idiopathic anaphylaxis  #Hx of CVID  Significant history of allergic reaction and anaphylaxis to hospital based products. Continued home regimen. S/p IV benadryl, solumedrol and pepcid x1 initially on admission. No current allergic reactions noted. Betadine preferred for skin prep. Documented allergies to aspirin, IV contrast and additional anti-hypertensive medications. Follows with Dr. Grover Canavan at [facility]. Current home medication regimen has been successful at controlling the majority of her symptoms outpatient. Current outpatient regimen: diphenhydramine PO 50-150 mg TID, cetirizine 10 mg daily (with additional PM dose as needed), fexofenadine 180 mg nightly, famotidine 40 mg BID, hydroxyzine 25 mg nightly, doxepin 10 mg nightly, cromolyn 200 mg TID, montelukast 10 mg nightly, and hydroxychloroquine. States that while on this regimen, her use of epinephrine has decreased from several times per week, to 1-2 times per month.   Plan:  - Allergy and immunology consulted              - Continue home oral antihistamine and mast cell stabilizer regimen as listed above              - If develops urticaria with skin adhesives - able to give additional dose of PO antihistamine, low dose benadryl IV if unable to tolerate PO              - If plan  for invasive procedure while inpatient - pre-medicate with prednisone 50mg  PO 13 hours prior, 7 hours prior, and 1 hour prior to surgery  - Use betadine for skin pre-treatment  - Emergency epi-pen PRN - patient has four in her purse, pharmacy aware, can use patient supply  - No alcohol-related products, avoid strong odors  - Communication for proper precaution shared with the rest of the care team  ??  #T2DM  No acute complaints. Last A1c 6.1 on 7/26. Continue weekly dulaglutide (Wednesdays). No hyperglycemia this admission thus far.  - LDSSI if necessary  ??  #GERD  No acute complaints.   - Continue home famotidine and omeprazole  ??  #Hypothyroidism  No acute complaints.   - Continue home levothyroxine 175 mcg daily  ??  #  Anxiety & Depression  No acute complaints. Followed by psychiatry  - Continue home Buspar 20mg  BID and Conazepam 0.5mg  BID PRN  ??  #Hypertriglyceridemia  No acute complaints  - Continue home Zetia 10mg  daily    Orders Placed This Encounter   Procedures   ??? Diet regular     DVT prophylaxis: Subcutaneous heparin (prophylaxis)  Code Status: Full Code      Ramiya Delahunty, DO   Internal Medicine, PGY-1  08/29/2020 11:45 AM

## 2020-08-29 NOTE — Unmapped (Signed)
Problem: Glucose Imbalance related to diabetes disease process  Goal: Clinical indication of glucose balance is achieved  08/29/2020 1711 by Barbaraann Faster, RN  Outcome: Adequate for Discharge  08/29/2020 0728 by Barbaraann Faster, RN  Outcome: Progressing  Goal: Patient's discharge needs are met  08/29/2020 1711 by Barbaraann Faster, RN  Outcome: Adequate for Discharge  08/29/2020 0728 by Barbaraann Faster, RN  Outcome: Progressing     Problem: Knowledge deficit related to self-management of chronic disease  Goal: Patient/family/caregiver demonstrates understanding of disease process, treatment plan, medications, and discharge instructions  08/29/2020 1711 by Barbaraann Faster, RN  Outcome: Adequate for Discharge  08/29/2020 0728 by Barbaraann Faster, RN  Outcome: Progressing     Problem: Potential for imbalanced nutrition related to metabolic effect of diabetes  Goal: Patient's nutritional needs will be met  08/29/2020 1711 by Barbaraann Faster, RN  Outcome: Adequate for Discharge  08/29/2020 0728 by Barbaraann Faster, RN  Outcome: Progressing     Problem: Risk for ineffective therapeutic regimen management related to insulin pump  Goal: Blood glucose is within target range  08/29/2020 1711 by Barbaraann Faster, RN  Outcome: Adequate for Discharge  08/29/2020 0728 by Barbaraann Faster, RN  Outcome: Progressing     Problem: Acute Pain  Description: Patient's pain progressing toward patient's stated pain goal  Goal: Patient displays improved well-being such as baseline levels for pulse, BP, respirations and relaxed muscle tone or body posture  Outcome: Adequate for Discharge  Goal: Patient will manage pain with the appropriate technique/intervention  Description: Assess and monitor patient's pain using appropriate pain scale. Collaborate with interdisciplinary team and initiate plan and interventions as ordered.  Re-assess patient's pain level 30-60 minutes after pain management intervention.  Outcome: Adequate for Discharge  Goal: Patient will reduce or eliminate use  of analgesics  Outcome: Adequate for Discharge  Goal: Patients pain is managed to allow active participation in daily activities  Outcome: Adequate for Discharge  Goal: Patient verbalizes a reduction in pain level  Outcome: Adequate for Discharge  Goal: Discharge Pain Management Plan (Acute Pain)  Outcome: Adequate for Discharge

## 2020-08-29 NOTE — Unmapped (Signed)
Brief ENT Progress Note    The patient was seen and evaluated this morning on AM rounds. She remains stable with no concerns from an airway standpoint. No noisy breathing, stridor, or swelling of the lip, tongue, or oropharynx. Recent history of air hunger on exertion does not appear to be related to her upper airway. We prefer to defer scoping her at this time given her present stability and history of adverse reactions to simple medical procedures. ENT will continue to follow this patient while she is in house, and please page [place] [phone]) with any acute airway concerns.    Hulan Fess, MD  ENT Resident

## 2020-08-29 NOTE — Unmapped (Signed)
Responded to referral from chaplain.  Provided spiritual care through listening and pastoral presence.  No additional needs stated at this time.  Chaplains can be paged at [phone] if additional needs develop.    Roberts Bon, Staff Chaplain  MDiv, B[facility]

## 2020-09-01 NOTE — Unmapped (Signed)
Patient called to get HDF with the office for September. Patient can be reached at [phone]

## 2020-09-01 NOTE — Unmapped (Signed)
Called and scheduled patient for [facility] for September.

## 2020-09-01 NOTE — Unmapped (Signed)
Pt is calling to let [name] Know that she was approved for her hearing aids as of 08/25/20 PA #[id number]

## 2020-09-11 ENCOUNTER — Ambulatory Visit
Admit: 2020-09-11 | Discharge: 2020-09-11 | Payer: PRIVATE HEALTH INSURANCE | Attending: Student in an Organized Health Care Education/Training Program

## 2020-09-11 DIAGNOSIS — F410 Panic disorder [episodic paroxysmal anxiety] without agoraphobia: Secondary | ICD-10-CM

## 2020-09-11 MED ORDER — busPIRone (BUSPAR) 10 MG tablet
10 | ORAL_TABLET | Freq: Two times a day (BID) | ORAL | 0 refills | Status: AC
Start: 2020-09-11 — End: 2020-12-10

## 2020-09-11 MED ORDER — hydrOXYzine HCL (ATARAX) 25 MG tablet
25 | ORAL_TABLET | Freq: Every evening | ORAL | 3 refills | Status: AC | PRN
Start: 2020-09-11 — End: ?

## 2020-09-11 MED ORDER — clonazePAM (KLONOPIN) 0.5 MG tablet
0.5 | ORAL_TABLET | Freq: Two times a day (BID) | ORAL | 2 refills | Status: AC | PRN
Start: 2020-09-11 — End: 2020-12-10

## 2020-09-11 NOTE — Unmapped (Signed)
[facility]  [facility]  Progress Note     Emily Bonilla  [birth year]  09/11/2020    Face to Face Time: 30    This was a Video visit, including two-way audio and video communication, in lieu of an in-person visit. The patient provided verbal consent to participate in the telehealth visit.     I spent 30 minutes speaking with the patient, conducting an interview, performing a limited exam, and educating the patient on my assessment and plan. I also spent 10 minutes, on the same day as the encounter, preparing to see the patient (eg, review of tests), ordering medications, tests, or procedures, referring and communicating with other health care professionals  and documenting clinical information in the electronic or other health record.    Chief Complaint:     Anxiety    Interval History:     Emily Bonilla is a 60 y.o. White or Caucasian female with a history of unspecified anxiety disorder, mast cell activation syndrome, atrial fibrillation, WPW, anemia, asthma, hypothyroidism, GERD, intermittent chronic HA, PNES who presents to [facility] on 09/11/2020 for follow up. Last seen on 06/19/2020 where no changes were made to psychiatric medications    Today, [name] expressed that her anxiety is still present though stable. She was able to go on vacation x 3 weeks with her family for the first time in ~5 years which was very meaningful for her. Since I was meeting her for the first time she explained her ongoing medical concerns, and explained how mast cell activation syndrome has affected her life. She has been dealing with sudden medical issues, passing out, seizures, severe allergic reactions for her whole life. Does feel that in the past year things have been generally better. Also recently saw an otolaryngologist regarding tinnitius and felt significant improvement in her anxiety when she had a hearing aid on that alleviated it.      Psychiatric Review of Symptoms:      The patient denies neurovegetative symptoms of depression including: -anhedonia, -decreased energy, -decreased sleep, -guilt/low self-esteem, -decreased concentration, -appetitte changes, -sleep changes, -PMR/PMA, -SI.    The patient denies manic symptoms including: -elevated mood, -increased energy, -decreased need for sleep, -rapid speech, -racing thoughts, -impulsivity, -grandiosity, -severe irritability.    The patient denies psychotic symptoms including: -evidence of delusional thinking, -hallucinations.    Changes to Psychiatric/Social/Substance History:     No updates made    Past Medical and Past Surgical History     Past Medical History:   Diagnosis Date   ??? Anaphylaxis    ??? Anemia    ??? Anxiety    ??? Asthma    ??? Atrial fibrillation (CMS Dx)    ??? Bronchitis    ??? Heart disease    ??? Neurological disease    ??? OSA (obstructive sleep apnea)     states resolved   ??? Osteoporosis    ??? Psychogenic nonepileptic seizure    ??? Seizures (CMS Dx)    ??? Spontaneous pneumothorax     states pneumomediatsium/pneumopericardium, self resolved   ??? Thyroid disease    ??? Ulcer    ??? WPW (Wolff-Parkinson-White syndrome)        Past Surgical History:   Procedure Laterality Date   ??? BONE MARROW BIOPSY      benign   ??? CARDIAC ELECTROPHYSIOLOGY MAPPING AND ABLATION      attempted x 2, states not successful either time   ??? CHOLECYSTECTOMY  2015   ???  DENTAL EXTRACTION Right 04/25/2017    Procedure: EXTRACTION TOOTH # 31, BRIDGE RESECTIONING;  Surgeon: Gladys Damme, DMD;  Location: [facility] OR;  Service: Oral Surgery;  Laterality: Right;   ??? FACIAL COSMETIC SURGERY     ??? HYSTERECTOMY  2009   ??? INSERTION CATHETER VASCULAR ACCESS N/A 09/06/2017    Procedure: INSERTION POWER PORT A CATH;  Surgeon: Penni Bombard, MD;  Location: [facility] OR;  Service: General;  Laterality: N/A;   ??? LIVER BIOPSY      benign hepatic adednoma per patient   ??? REMOVAL HARDWARE LEG Left 08/17/2016    Procedure: REMOVAL OF HARDWARE LEFT TIBIA;  Surgeon: Andee Lineman, MD;   Location: [facility] OR;  Service: Orthopedics;  Laterality: Left;   ??? TIBIA FRACTURE SURGERY     ??? TONSILLECTOMY      + adenoids       Allergies     Allergies   Allergen Reactions   ??? Acetaminophen Anaphylaxis     Unknown   ??? Adhesive Tape-Silicones Itching, Rash, Swelling and Anaphylaxis     Unknown   ??? Alcohol Anaphylaxis   ??? Amitriptyline Anaphylaxis   ??? Aspirin Anaphylaxis     Unknown   ??? Benzoic Acid Anaphylaxis   ??? Blue Dye Anaphylaxis     Unknown   ??? Flecainide Shortness Of Breath and Anaphylaxis     Itching, decreased libido, wheezing, flushing, numbness.    ??? Gabapentin Anaphylaxis   ??? Iodinated Contrast Media Anaphylaxis   ??? Isopropyl Alcohol Anaphylaxis   ??? Latex Anaphylaxis and Rash   ??? Levofloxacin Itching and Anaphylaxis   ??? Lidocaine Anaphylaxis   ??? Nsaids (Non-Steroidal Anti-Inflammatory Drug) Anaphylaxis   ??? Onion Anaphylaxis   ??? 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   ??? Paraben Anaphylaxis   ??? Perfume Anaphylaxis   ??? Pregabalin Anaphylaxis   ??? Preservative Anaphylaxis   ??? Quetiapine Anaphylaxis     Mental Status Change  Other reaction(s): Angioedema   ??? Red Dye Anaphylaxis     Unknown   ??? Sulfa (Sulfonamide Antibiotics) Anaphylaxis   ??? Sulfur Anaphylaxis     Pt states: Any alcohol product   ??? Trazodone Rash and Anaphylaxis   ??? Verapamil Anaphylaxis   ??? Yellow Dye Anaphylaxis     Unknown   ??? Green Dye Rash     Other reaction(s): Contact Dermatitis   ??? Onion Extract Other (See Comments)   ??? Doxepin Other (See Comments)     Sedation and sleep paralysis (side effect)   ??? Duloxetine      Other reaction(s): Other (See Comments)  Mental Status Change   ??? Epi E-Z Pen    ??? Hydroxyzine Itching     Unknown   ???  Statins-Hmg-Coa Reductase Inhibitors    ??? Verapamil (Bulk) Hives     Insomnia, flushing, scattered brain, migraine, itching, anxiety.      Home Medications:     Current Outpatient Medications   Medication Sig   ??? azelastine Use into each nostril.   ??? blood-glucose meter Use meter to test blood glucose twice daily   ??? budesonide Take 0.5 mg by mouth.   ??? busPIRone Take 2 tablets (20 mg total) by mouth in the morning and  at bedtime. Indications: Generalized Anxiety Disorder   ??? ZyrTEC Take 10 mg by mouth daily.   ??? cholecalciferol (vitamin D3) Take 1 capsule (5,000 Units total) by mouth every Monday, Wednesday, and Friday.   ??? clonazePAM Take 1 tablet (0.5 mg total) by mouth 2 times a day as needed for Anxiety for up to 60 days.   ??? cromolyn Take 10 mLs (200 mg total) by mouth 4 times daily before meals and at bedtime.   ??? diphenhydrAMINE Take 3-6 capsules (75-150 mg total) by mouth daily.   ??? doxepin TAKE ONE (1) CAPSULE BY MOUTH AT BEDTIME.   ??? doxycycline Take 1 capsule (100 mg total) by mouth 2 times a day.   ??? dulaglutide Inject 4.5 mg subcutaneously every 7 days.   ??? EPINEPHrine Inject into the muscle.   ??? ezetimibe TAKE ONE TABLET BY MOUTH EVERY DAY   ??? famotidine Take 1 tablet (40 mg total) by mouth 2 times a day.   ??? fexofenadine Take 1 tablet (180 mg total) by mouth daily.   ??? fluticasone propionate Use 1 spray into each nostril daily.   ??? furosemide Take 1 tablet (20 mg total) by mouth daily.   ??? hydrOXYchloroQUINE Take by mouth daily with breakfast.   ??? hydrOXYzine HCL 25 mg. Once daily at nighttime   ??? levalbuterol Inhale 3 mL (1.25 mg total) by nebulization every 6 hours as needed for Wheezing.   ??? levothyroxine TAKE 1 TABLET BY MOUTH ONCE DAILY.   ??? montelukast TAKE ONE TABLET BY MOUTH AT BEDTIME.   ??? mupirocin Apply topically 3 times a day.   ??? Compact Compressor Nebulizer Please dispense 1 nebulizer with tubing and mouthpiece.   ??? omeprazole Take by mouth. Twice a day   ??? onabotulinumtoxinA (cosmetic)  Inject into the muscle.   ??? triamcinolone APPLY 2 TIMES DAILY   ??? True Metrix Glucose Test Strip USE TO TEST BLOOD SUGAR 2 TIMES DAILY   ??? TRUEplus Lancets USE TWICE A DAY AS DIRECTED.   ??? UNABLE TO FIND Rx for Therapeutic Matrress for prevention of migraine d/t cervicogenic trigger from poor muscle alignment of back and neck with regular mattress.  She gets regular physical therapy and it helps somewhat but therapeutic mattress is recommended. She also gets Botox injections every 3 months but if a therapeutic mattress can help reduce her headache burden, this may be able to be stretched out to every 4 months or so.  She has severe Mast Cell Disorder and being in the office for the injections can flare her Mast Cell and cause severe reactions just from the environment.     No current facility-administered medications for this visit.       ROS:     Denies headache, nausea, vomiting, blurry vision, chest pain, SOB, abdominal pain, diarrhea, constipation, dysuria, dizziness, new rash    Vitals:     There were no vitals filed for this visit.    Wt Readings from Last 3 Encounters:   08/28/20 203 lb (92.1 kg)   08/28/20 201 lb (91.2 kg)   08/08/20 205 lb 3.2 oz (93.1 kg)     There is no height or weight on file to calculate BMI.    Physical Exam     Constitutional: She is oriented to person, place, and time. She is well-developed, well-nourished, and in no acute distress.    Head: Normocephalic and atraumatic.   Eyes: EOM are normal.   Neck: Normal range of motion.  Pulmonary/Chest: Effort normal. No respiratory distress.   Musculoskeletal: Normal range of motion.   Neurological: She is alert and oriented to person, place, and time.     Mental Status Exam:     Appearance: Limited due to video visit. Appears stated age, good eye contact, cooperative with interview  Movement: Limited due to video visit, no abnormal movements appreciated    Speech: fluent, with normal tone, volume, and rate  Language: appropriate to  interview  Mood/Affect: euthymic/ full range, mood congruent, content appropriate  Thought Process: goal directed, generally linear, no FOI  Associations: no LOA  Thought Content: Denies SI/HI, contracting for safety and making appropriate future plans; no delusions expressed  Perceptions: no evidence of psychotic thoughts; no AH/VH and not RTIS; denies neurovegetative symptoms  Orientation: alert, oriented to person place and situation  Fund Of Knowledge: grossly normal  Attention and Concentration: normal  Recent and Remote Memory: grossly intact    Insight/Judgment: fairly good/generally good, by recent history    Labs and Imaging:     Recent Results (from the past 336 hour(s))   Renal Function Panel w/EGFR    Collection Time: 08/28/20  2:50 PM   Result Value Ref Range    Sodium 144 133 - 146 mmol/L    Potassium 3.7 3.5 - 5.3 mmol/L    Chloride 108 98 - 110 mmol/L    CO2 26 21 - 33 mmol/L    Anion Gap 10 3 - 16 mmol/L    BUN 8 7 - 25 mg/dL    Creatinine 1.61 0.96 - 1.30 mg/dL    Glucose 045 (H) 70 - 100 mg/dL    Calcium 8.4 (L) 8.6 - 10.3 mg/dL    Phosphorus 2.7 2.1 - 4.7 mg/dL    Albumin 4.0 3.5 - 5.7 g/dL    Osmolality, Calculated 297 278 - 305 mOsm/kg    EGFR 80    Magnesium    Collection Time: 08/28/20  2:50 PM   Result Value Ref Range    Magnesium 1.9 1.5 - 2.5 mg/dL   High Sensitivity Troponin    Collection Time: 08/28/20  3:23 PM   Result Value Ref Range    High Sensitivity Troponin 3 0 - 14 ng/L   POC Glucose Monitoring Device    Collection Time: 08/28/20  7:54 PM   Result Value Ref Range    POC Glucose Monitoring Device 203 (H) 70 - 100 mg/dL   Venous Blood Gas, Line/Syringe    Collection Time: 08/28/20  9:59 PM   Result Value Ref Range    PH-Line Draw 7.48 (H) 7.32 - 7.42    PCO2-Line Draw 24 (L) 41 - 51 mm Hg    PO2-Line Draw 61 (H) 25 - 40 mm Hg    HCO3-Line Draw 18 (L) 24 - 28 mmol/L    CO2 Content-Line Draw 19 (L) 25 - 29 mmol/L    Base Excess-Line Draw -3.9 (L) -2.0 - 3.0 mmol/L    %HBO2-Line Draw  92.7 (H) 40.0 - 70.0 %    Carboxyhgb-Line Draw 1.0 %    Methemoglobin-Line Draw 0.5 0.0 - 1.5 %    Reduced Hemoglobin-Line Draw 5.8 (H) 0.0 - 5.0 %   High Sensitivity Troponin    Collection Time: 08/28/20 10:15 PM   Result Value Ref Range    High Sensitivity Troponin <3 0 - 14 ng/L   Renal Function Panel w/EGFR    Collection Time: 08/29/20  3:58 AM   Result Value Ref Range  Sodium 140 133 - 146 mmol/L    Potassium 4.9 3.5 - 5.3 mmol/L    Chloride 109 98 - 110 mmol/L    CO2 23 21 - 33 mmol/L    Anion Gap 8 3 - 16 mmol/L    BUN 10 7 - 25 mg/dL    Creatinine 1.61 0.96 - 1.30 mg/dL    Glucose 045 (H) 70 - 100 mg/dL    Calcium 8.8 8.6 - 40.9 mg/dL    Phosphorus 2.9 2.1 - 4.7 mg/dL    Albumin 3.8 3.5 - 5.7 g/dL    Osmolality, Calculated 291 278 - 305 mOsm/kg    EGFR >90    Magnesium    Collection Time: 08/29/20  3:58 AM   Result Value Ref Range    Magnesium 2.1 1.5 - 2.5 mg/dL   CBC, AM    Collection Time: 08/29/20  7:19 AM   Result Value Ref Range    WBC 13.0 (H) 3.8 - 10.8 10E3/uL    RBC 4.28 3.80 - 5.10 10E6/uL    Hemoglobin 11.7 11.7 - 15.5 g/dL    Hematocrit 81.1 91.4 - 45.0 %    MCV 81.9 80.0 - 100.0 fL    MCH 27.2 27.0 - 33.0 pg    MCHC 33.3 32.0 - 36.0 g/dL    RDW 78.2 95.6 - 21.3 %    Platelets 281 140 - 400 10E3/uL    MPV 8.5 7.5 - 11.5 fL   Basic Metabolic Panel    Collection Time: 08/29/20  7:19 AM   Result Value Ref Range    Sodium 144 133 - 146 mmol/L    Potassium 3.7 3.5 - 5.3 mmol/L    Chloride 109 98 - 110 mmol/L    CO2 27 21 - 33 mmol/L    Anion Gap 8 3 - 16 mmol/L    BUN 10 7 - 25 mg/dL    Creatinine 0.86 5.78 - 1.30 mg/dL    Glucose 469 (H) 70 - 100 mg/dL    Calcium 8.6 8.6 - 62.9 mg/dL    Osmolality, Calculated 298 278 - 305 mOsm/kg    EGFR >90        Risk Assessment:     Risk Assessment:   Risk factors for violence to self/others: unemployed, co-morbid anxiety, chronic medical illness, chronic pain  ??  Protective factors include: female, no previous suicide attempts, life satisfaction, positive  social support, denies hopelessness, no history of trauma, no psychosis, no cognitive dysfunction, no substance use, employed, denies SI/HI, future orientation, goal orientation, contracting for safety, sobriety, collateral information is reassuring, close outpatient follow-up, compliant with recommended medications, no access to guns or weapons  ??  Overall the risk:??low??for future dangerousness; however the patient's imminent risk??is low??as the patient now: denies SI/HI, intent or plan, is clinically sober, future oriented, contracting for safety, has outpatient psychiatric follow-up established, has improved social support. As such, the patient does not meet criteria for admission to inpatient psychiatry. They remain appropriate for an outpatient level of care.    Assessment and Plan:     60 y.o.??Caucasian female, history of unspecified anxiety disorder, atrial fibrillation, WPW, idiopathic mast cell activation, anemia, asthma, hypothyroidism, GERD, intermittent chronic HA, PNES, presents for follow up. Reports increase in anxiety - mostly appropriate and in the setting of grief. Has also self increased buspirone 20 mg BID (previously 10 mg BID) with positive response noted in anxiety. Is making appropriate plans, future and goal oriented - looking forward to  an upcoming vacation with daughter. Has had improved anxiety with imiproving medical stability--her anxiety is both pathological and also directly related to her physical health. Our goal is to wean anxiety medications while maintaining stability.    #??Unspecified anxiety,??in partial remission:   - CONTINUE clonazepam??0.5mg ??BID??PRN  ????????????????????????- Continue Buspirone 20 mg BID for anxiety??  ????????????????????????- Continue Hydroxyzine 50mg  QHS for anxiety??  ????????????????????????- Continue treatment with integrative medicine for biofeedback, massage therapy and acupuncture  ????????????????????????- Self-management: meditation, yoga????????????????????????  - Discussed all prescribed medications  including risks/benefits/side effects/alternatives. Patient expressed understanding and agreement with the current treatment plan  - [place]AR[place] report run on??09/11/2020. Appropriate and consistent with previous prescriptions.     # Safety:??  - The patient???s imminent risk is low, see risk assessment above. At this time, does not meet criteria for involuntary psychiatric hospitalization and is appropriate for outpatient level of care.  ????????????????????????- Reviewed emergency contact protocol during and after clinic hours  ????????????????????????- Patient expressed understanding of emergency resources including PES, 911, and the suicide hotline  ??  # Substance use: No active issues  ????????????????????????  # Medical: Continue regular follow up with PCP, sub specialists??  ??  Return to clinic in??3 months    Nicholaus Bloom, MD      The HPI, ROS, physical exam, test results and assessment & plan were reviewed and copied forward (with edits) from a note written by Dr. Erik Obey on 06/19/2020. I have reviewed and updated the history, physical exam, data, assessment, and plan of the note so that it reflects my evaluation and management of the patient.

## 2020-09-12 ENCOUNTER — Inpatient Hospital Stay
Admit: 2020-09-12 | Payer: PRIVATE HEALTH INSURANCE | Attending: Student in an Organized Health Care Education/Training Program

## 2020-09-12 DIAGNOSIS — R0600 Dyspnea, unspecified: Secondary | ICD-10-CM

## 2020-09-12 NOTE — Unmapped (Signed)
Pt called regarding this encounter    Pt states she completed the MRI without contrast and states ???This is an expensive test to not have done correctly, I want to speak with someone now. I am really frustrated over this???    Pt requests a call back at [phone]

## 2020-09-12 NOTE — Unmapped (Signed)
Somer asked to speak to MD regarding MRI test advised during admittance by him and hospitalist.Marylon informed that IV benadryl and a steroid would be administered prior to testing.No orders for IV in epic.Shekela stated concerns and frustrations.Imaging department will attempt test with no contrast.    Lyly asked for a returned call asap or asked that MD.Zuzek call MRI department to clarify

## 2020-09-12 NOTE — Unmapped (Signed)
Patient is very upset and wants to speak to nurse today.    Patient can be reached at [phone].

## 2020-09-15 ENCOUNTER — Inpatient Hospital Stay
Admit: 2020-09-15 | Payer: PRIVATE HEALTH INSURANCE | Attending: Student in an Organized Health Care Education/Training Program

## 2020-09-15 DIAGNOSIS — R0600 Dyspnea, unspecified: Secondary | ICD-10-CM

## 2020-09-15 LAB — PFT13-PULMONARY FUNCTION TEST
DLCO%: 114
DLCO: 28.13
FEV1%: 109
FEV1/FVC EXP: 78
FEV1/FVC: 75
FEV1: 3.02
FVC%: 113
FVC: 4.05
RV%: 92
RV: 1.81
TLC%: 105
TLC: 5.83
VC%: 112
VC: 4.02

## 2020-09-15 NOTE — Unmapped (Signed)
Spoke to Production designer, theatre/television/film. Placed patient on for Wednesday with Dr. Welton Flakes. Patient notified.

## 2020-09-15 NOTE — Unmapped (Signed)
Pt called stating she is needing orders for Pre IV push medication for the MRI with Contrast.     Pt is very upset that she has not had a call back from staff.     Pt can be reached at [phone]

## 2020-09-17 ENCOUNTER — Ambulatory Visit
Admit: 2020-09-17 | Discharge: 2021-02-26 | Payer: PRIVATE HEALTH INSURANCE | Attending: Student in an Organized Health Care Education/Training Program

## 2020-09-17 DIAGNOSIS — R0789 Other chest pain: Secondary | ICD-10-CM

## 2020-09-17 NOTE — Unmapped (Addendum)
Please call and schedule your stress test. This will help see if there are areas of low blood flow to your heart.    Follow up in 3 months.

## 2020-09-17 NOTE — Unmapped (Addendum)
[facility]  New Patient Visit    Chief Complaint: hospital follow-up, chest pain    HPI:   Emily Bonilla is a 60 y.o. female with past medical hx of WPW (unsuccessful ablation previously x2) , paroxysmal atrial fibrillation (not on anticoagulation), common variable immunodeficiency disease,??mast cell activation syndrome, chronic urticaria, recurrent anaphylaxis with??reported??past episode of cardiac arrest, T2DM on Trulicity, GERD, Hashimoto thyroiditis, hypoparathyroidism, anxiety, and depression. Cardiology consulted for chest pain.     Patient was recently admitted to the hospital 8/11-8/12 for exertional dyspnea and chest pain. Patient had TTE with showed normal EF and asymmetric RV hypertrophy. Cardiology was consulted and recommended outpatient cardiac MRI for further evaluation of RV. Patient had PFTs that were normal and CT chest with mild ground glass opacity in the left apex. CT chest also with moderate coronary artery calcifications.     Patient continues to feel shortness of breath with walking up stairs. She said her shortness of breath and chest pain started in March. She feels chest pressure when getting up stairs. Patient has been trying to be more active, but is limited by her shortness of breath.  Has been feeling fluttering in chest for 30 years and had ablations in the past for her WPW. She has been waking up in the middle of the night with shortness of breath for past 6 weeks.       Review of system:    - HENT: no congestion or HA    - Cardiac: Positive for chest pain and palpitations     - Resp: Positive for SOB, no cough    - Allergy/Immunology: many instances in the past of anaphylaxis, most recently had one in the hospital because of perfume.    - GI: no abdominal pain, n/v/c/d    - GU: no burning or increased urination       ASSESSMENT AND PLAN:  Emily Bonilla is a 60 y.o. female  with past medical hx of WPW (unsuccessful ablation previously x2) , paroxysmal atrial  fibrillation (not on anticoagulation), common variable immunodeficiency disease,??mast cell activation syndrome, seen by cardiology service for chest pain. Cardiac problems being addressed are:    #Chest pain  Patient recently in the hospital for chest pain. Troponins were negative and no ischemic changes on EKG. TTE showed EF 55-60% with no wall motion abnormalities. Inpatient team recommended cardiac MRI for concerns of RV disease and restrictive/constrictive process given enlarged RV on previous echo. They also recommended CT chest and PFTs, which have resulted as relatively normal and likely not causing her shortness of breath. Patient had cardiac MRI without contrast that showed normal LV and RV size and function. 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 Recommended to have contrast portion to better evaluate mitral annular disjunction.   At this time, most concerning issue is patient's chest pain and shortness of breath with exertion that is limiting her activities. Given her strong family history of CAD and her hyperlipidemia, patient has intermediate pre-test probability of CAD.   - NM stress test to evaluate CAD  - Will hold off on contrast portion of MRI for evaluation of mitral valve dysfunction and prioritize CAD evaluation at this time.   - Patient reported having allergy with metoprolol, so will not add this to medication regimen.    Risks and benefits of new therapies added and new invasive and non-invasive procedures/diagnostic testing planned discussed with and understood by the patient/family who agree  with the above plan.      Case was discussed with attending cardiologist Dr. Narda Amber    RTC: 3 months    Past Medical and Sugical History:    Past Medical History:   Diagnosis Date   ??? Anaphylaxis    ??? Anemia    ??? Anxiety    ??? Asthma    ??? Atrial fibrillation (CMS Dx)    ??? Bronchitis    ??? Heart disease    ??? Neurological disease    ??? OSA  (obstructive sleep apnea)     states resolved   ??? Osteoporosis    ??? Psychogenic nonepileptic seizure    ??? Seizures (CMS Dx)    ??? Spontaneous pneumothorax     states pneumomediatsium/pneumopericardium, self resolved   ??? Thyroid disease    ??? Ulcer    ??? WPW (Wolff-Parkinson-White syndrome)      Patient Active Problem List    Diagnosis Date Noted   ??? Dyspnea on exertion 08/29/2020   ??? Controlled type 2 diabetes mellitus with hyperglycemia, unspecified whether long term insulin use (CMS Dx) 02/06/2020   ??? Muscle weakness of lower extremity 06/28/2019   ??? Vertigo, benign positional, unspecified laterality 03/20/2018   ??? Tinnitus of both ears 03/20/2018   ??? Type 2 diabetes mellitus without complication, with long-term current use of insulin (CMS Dx) 06/29/2017   ??? Hyperthyroidism 06/29/2017   ??? Weight gain 06/29/2017   ??? Vitamin D deficiency 06/29/2017   ??? Hyperparathyroidism (CMS Dx) 06/29/2017   ??? Obesity (BMI 30.0-34.9) 06/29/2017   ??? Poor venous access 06/29/2017   ??? CVID (common variable immunodeficiency) (CMS Dx) 06/06/2017   ??? Caries 04/08/2017   ??? Intractable seizures (CMS Dx) 10/28/2016   ??? Spells of decreased attentiveness    ??? Seizure (CMS Dx)    ??? Hypothyroidism due to Hashimoto's thyroiditis 08/05/2016   ??? History of asthma    ??? Painful orthopaedic hardware (CMS Dx) 06/24/2016   ??? Anxiety and depression 03/31/2016   ??? 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 Multiple allergies 03/30/2016   ??? Syncope and collapse 03/30/2016      Testing   No resolved problems to display.       Social History:     Social History     Socioeconomic History   ??? Marital status: Divorced     Spouse name: Not on file   ??? Number of children: Not on file   ??? Years of education: Not on file   ??? Highest education level: Not on file   Occupational History   ??? Not on file   Tobacco Use   ??? Smoking status: Never Smoker   ??? Smokeless tobacco: Never Used   Vaping Use   ??? Vaping Use: Never used    Substance and Sexual Activity   ??? Alcohol use: No   ??? Drug use: No   ??? Sexual activity: Not Currently   Other Topics Concern   ??? Caffeine Use Yes     Comment: 1-2 Cups Coffee Daily   ??? Occupational Exposure No   ??? Exercise Yes     Comment: walking and resistance bands daily   ??? Seat Belt Yes   Social History Narrative   ??? Not on file     Social Determinants of Health     Financial Resource Strain: Not on file   Physical Activity: Not on file   Stress: Not on  file   Social Connections: Not on file   Housing Stability: Not on file       Family History:   Family History   Problem Relation Age of Onset   ??? Kidney failure Mother    ??? Aneurysm Mother        Allergies:   Allergies   Allergen Reactions   ??? Acetaminophen Anaphylaxis     Unknown   ??? Adhesive Tape-Silicones Itching, Rash, Swelling and Anaphylaxis     Unknown   ??? Alcohol Anaphylaxis   ??? Amitriptyline Anaphylaxis   ??? Aspirin Anaphylaxis     Unknown   ??? Benzoic Acid Anaphylaxis   ??? Blue Dye Anaphylaxis     Unknown   ??? Flecainide Shortness Of Breath and Anaphylaxis     Itching, decreased libido, wheezing, flushing, numbness.    ??? Gabapentin Anaphylaxis   ??? Iodinated Contrast Media Anaphylaxis   ??? Isopropyl Alcohol Anaphylaxis   ??? Latex Anaphylaxis and Rash   ??? Levofloxacin Itching and Anaphylaxis   ??? Lidocaine Anaphylaxis   ??? Nsaids (Non-Steroidal Anti-Inflammatory Drug) Anaphylaxis   ??? Onion Anaphylaxis   ??? 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   ??? Paraben Anaphylaxis   ??? Perfume Anaphylaxis   ??? Pregabalin Anaphylaxis   ??? Preservative Anaphylaxis   ??? Quetiapine Anaphylaxis     Mental Status Change  Other reaction(s): Angioedema   ??? Red Dye Anaphylaxis     Unknown    ??? Sulfa (Sulfonamide Antibiotics) Anaphylaxis   ??? Sulfur Anaphylaxis     Pt states: Any alcohol product   ??? Trazodone Rash and Anaphylaxis   ??? Verapamil Anaphylaxis   ??? Yellow Dye Anaphylaxis     Unknown   ??? Green Dye Rash     Other reaction(s): Contact Dermatitis   ??? Onion Extract Other (See Comments)   ??? Doxepin Other (See Comments)     Sedation and sleep paralysis (side effect)   ??? Duloxetine      Other reaction(s): Other (See Comments)  Mental Status Change   ??? Epi E-Z Pen    ??? Hydroxyzine Itching     Unknown   ??? Statins-Hmg-Coa Reductase Inhibitors    ??? Verapamil (Bulk) Hives     Insomnia, flushing, scattered brain, migraine, itching, anxiety.        Current cardiac medications:   Zetia 10 mg  PO lasix 20 mg   Trulicity      Physical examination:    - There were no vitals filed for this visit.    -   Wt Readings from Last 3 Encounters:   08/28/20 203 lb (92.1 kg)   08/28/20 201 lb (91.2 kg)   08/08/20 205 lb 3.2 oz (93.1 kg)       - has been stable    GENERAL - Patient AAOx4 in no acute distress  HEENT - EOMI, PERRL, no scleral icterus. Oral mucosa pink and moist  NECK - Supple. No lymphadenopathy. No JVD.  CARDIOVASCULAR - RR, no murmur/rubs/gallops.  No lower extremity edema  PULSES - Pedal pulses 2+ bilaterally and symmetric  LUNGS - CTA bilaterally, good breath sounds. No wheezes or crackles  ABDOMEN - Soft, non-tender, non-distended. Normal BS.   SKIN: small  areas of healed urticaria on arms    Labs:  Lab Results   Component Value Date    GLUCOSE 114 (H) 08/29/2020    BUN 10 08/29/2020    CREATININE 0.71 08/29/2020    K 3.7 08/29/2020    PHOS 2.9 08/29/2020    ALBUMIN 3.8 08/29/2020    EGFR >90 08/29/2020     Lab Results   Component Value Date    WBC 13.0 (H) 08/29/2020    HGB 11.7 08/29/2020    HCT 35.1 08/29/2020    MCV 81.9 08/29/2020    PLT 281 08/29/2020     Lab Results   Component Value Date    TSH 0.91 05/01/2020     Lab Results   Component Value Date    CKTOTAL 108 05/31/2017    TROPONINI <0.04  05/29/2016     Lab Results   Component Value Date    CHOLTOT 188 08/12/2020    TRIG 204 (H) 08/12/2020    HDL 38 (L) 08/12/2020    LDL 109 08/12/2020       Cardiac workup:  EKG:  08/28/20: NSE, mild ST depression V2-V4    Last Echocardiogram:  Results for orders placed during the hospital encounter of 08/28/20    Echo 2D Complete (TTE)    Narrative  * [facility]*  [address]  [place], [place] [address]  [phone]    Transthoracic Echocardiography    Patient:    Edmonia, Gonser  MR #:       [id number]  Account:  Study Date: 08/28/2020  Gender:     F  Age:        77  DOB:        March 08, 1960  Room:       [facility]    PERFORMING   Claretta Fraise, MD  SONOGRAPHER  Macswords, Henrene Pastor, Marissa  REFERRING    Jama, Marissa  ATTENDING    Matesic, Sterling Big    -------------------------------------------------------------------    Procedure:ECHO 2D COMPLETE (TTE)              Order: Accession Number:[id number]    -------------------------------------------------------------------  Indications:      Chest Pain, unspecified.    -------------------------------------------------------------------  Risk factors:  systemic mastocytosis/mast cell activation syndrome,  common variable immunodeficiency syndrome, cardiac arrest and  anaphylaxis, type 2 diabetes mellitus, intermittent asthma, GERD,  focal hepatic adenoma, fatty liver, anxiety, depression,  hashimoto's thyroiditis, non-epileptic seizures, hypoparathyroidism,  and remote history of WPW who was called for direct admission by  PCP, Dr. Alpha Gula, with chest pain concerning for angina.    -------------------------------------------------------------------  Study data:  Height: 67in. 170.2cm. Weight: 200.6lb. 91.2kg. No  prior study was available for comparison.  Study status:  Routine.  Procedure:  Transthoracic echocardiography. Image quality was fair.  Scanning was performed from the  parasternal, apical, and subcostal  acoustic windows.          Transthoracic echocardiography. M-mode,  complete 2D, 3D, strain, complete spectral Doppler, and color  Doppler.  Birthdate:  Patient birthdate: May 06, 1960.  Age:  Patient  is 60yr old.  Sex:  Birth gender: female.  Body mass index:  BMI:  31.5kg/m^2.  Body surface area:    BSA: 2.33m^2.  Blood pressure:  143/74  Patient status:  Inpatient.  Study date:  Study date:  08/28/2020. Study time: 02:01 PM.  Location:  Bedside.    -------------------------------------------------------------------  Study Conclusions    - Left ventricle: The cavity size is normal. Wall thickness was increased in a pattern of mild LVH.  Systolic function was normal. Wall motion was normal; there were no regional wall motion  abnormalities. Doppler parameters are consistent with abnormal left ventricular relaxation (grade  1 diastolic dysfunction).  - Mitral valve: Mild regurgitation.  - Left atrium: The atrium is mildly dilated.  - Right ventricle: Systolic function was normal.  - Pericardium, extracardiac: A trivial pericardial effusion is identified. Features were not  consistent with tamponade physiology.    Impressions:  No previous study was available for comparison.    -------------------------------------------------------------------  Cardiac Anatomy    Left ventricle:    - The cavity size is normal. Wall thickness was increased in a pattern of mild LVH. Systolic  function was normal. Wall motion was normal; there were no regional wall motion abnormalities.    - Wall motion score: 1.00.    - Doppler parameters are consistent with abnormal left ventricular relaxation (grade 1 diastolic  dysfunction).    Aorta:   Aortic root:    - The aortic root is normal in size.    Aortic valve:    - Trileaflet; normal thickness leaflets. Mobility was not restricted.    Doppler:    - Transvalvular velocity is within the normal range. There is no stenosis. No regurgitation.    Mitral  valve:    - Structurally normal valve. Mobility was not restricted.    Doppler:    - Transvalvular velocity is within the normal range. There is no evidence for stenosis. Mild  regurgitation.    Left atrium:    - The atrium is mildly dilated.    Pulmonary artery:    - The main pulmonary artery was normal-sized. Systolic pressure could not be accurately estimated.    Systemic veins:  Inferior vena cava: The vessel was normal in size. The  respirophasic diameter changes were in the normal range (>= 50%),  consistent with normal central venous pressure.  Right ventricle:    - The cavity size is normal. Wall thickness is normal. Systolic function was normal.    Pulmonic valve:    Doppler:    - Transvalvular velocity is within the normal range. There is no evidence for stenosis. No  regurgitation.    Tricuspid valve:    - Structurally normal valve.    Doppler:    - Transvalvular velocity is within the normal range. Trivial regurgitation.    Right atrium:    - The atrium is at the upper limits of normal in size.    Pericardium:    - A trivial pericardial effusion is identified.    Doppler:  Features were not consistent with tamponade physiology.  Systemic veins:  Inferior vena cava:    - The vessel was normal in size. The respirophasic diameter changes were in the normal range (>=  50%), consistent with normal central venous pressure.  -    -------------------------------------------------------------------  Measurements    Left ventricle                  Value           Ref  GLS, 2D                         -17.40 %        ----------  EDD, LAX               (  N)      4.8    cm       3.8 - 5.2  ESD, LAX               (N)      3.3    cm       2.2 - 3.5  EDD/bsa, LAX           (N)      2.4    cm/m^2   2.3 - 3.1  ESD/bsa, LAX           (N)      1.6    cm/m^2   1.3 - 2.1  FS, LAX                (N)      31     %        27 - 45  FS, LAX chord          (N)      31     %        27 - 45  ESD major ax, A4C               6.7    cm        ----------  ESD/bsa major ax, A4C           3.3    cm/m^2   ----------  EDD minor ax, A4C               6.7    cm       ----------  EDD/bsa minor ax, A4C           3.3    cm/m^2   ----------  EDA, A4C                        26.5   cm^2     ----------  ESA, A4C                        14.9   cm^2     ----------  FAC, A4C                        44     %        ----------  EDD major ax, A2C               8.3    cm       ----------  EDD/bsa major ax, A2C           4.1    cm/m^2   ----------  EDA, A2C                        28.9   cm^2     ----------  ESA, A2C                        14.7   cm^2     ----------  FAC, A2C                        49     %        ----------  ESD                    (N)  3.3    cm       2.2 - 3.5  ESD/bsa                (N)      1.6    cm/m^2   1.3 - 2.1  PW, ED                 (H)      1.0    cm       0.6 - 0.9  IVS/PW, ED                      1               ----------  EDV                    (H)      108    ml       46 - 106  ESV                    (H)      44     ml       14 - 42  EF                     (N)      59     %        54 - 74  SV                              49     ml       ----------  EDV/bsa                (N)      53     ml/m^2   29 - 61  ESV/bsa                (N)      22     ml/m^2   8 - 24  SV/bsa                          24     ml/m^2   ----------  EDV, 1-p A2C           (N)      83     ml       41 - 133  ESV, 1-p A2C           (N)      53     ml       10 - 54  EF, 1-p A2C            (N)      64     %        52 - 76  SV, 1-p A2C                     64     ml       ----------  EDV/bsa, 1-p A2C       (N)      41     ml/m^2   26 - 74  ESV/bsa, 1-p A2C       (N)      26     ml/m^2   6 - 30  SV/bsa, 1-p A2C                 31.5   ml/m^2   ----------  EDV, 1-p A4C           (N)      71     ml       48 - 140  ESV, 1-p A4C           (N)      31     ml       12 - 60  EF, 1-p A4C            (N)      56     %        46 - 78  SV, 1-p A4C                     75     ml        ----------  EDV/bsa, 1-p A4C       (N)      35     ml/m^2   30 - 82  ESV/bsa, 1-p A4C       (N)      15     ml/m^2   7 - 35  SV/bsa, 1-p A4C                 37     ml/m^2   ----------  EDV, 2-p               (N)      77     ml       46 - 106  ESV, 2-p               (N)      31     ml       14 - 42  EF, 2-p                (N)      60     %        54 - 74  SV, 2-p                         46     ml       ----------  EDV/bsa, 2-p           (N)      38     ml/m^2   29 - 61  ESV/bsa, 2-p           (N)      15     ml/m^2   8 - 24  SV/bsa, 2-p                     19.6   ml/m^2   ----------  E', lat ann, TDI       (L)      5.1    cm/sec   >=10.0  E/e', lat ann, TDI              13              ----------  A', lat ann, TDI                9.0    cm/sec   ----------  E'/a', lat ann, TDI  0.57            ----------  S', lat ann, TDI                7.0    cm/sec   ----------  E', med ann, TDI       (N)      37.5   cm/sec   >=7.0  E/e', med ann, TDI              2               ----------  A', med ann, TDI                10.7   cm/sec   ----------  E'/a', med ann, TDI             3.5             ----------  S', med ann, TDI                8.1    cm/sec   ----------  E', avg, TDI                    21.3   cm/sec   ----------  E/e', avg, TDI         (N)      3               <=14    LVOT                            Value           Ref  Diam, S                         2.0    cm       ----------  Area                            3.1    cm^2     ----------  Peak vel, S                     0.91   m/sec    ----------  Peak grad, S                    3      mm Hg    ----------  SV                              49     ml       ----------  SV/bsa                          24     ml/m^2   ----------    Ventricular septum              Value           Ref  IVS, ED                (H)      1.0    cm       0.6 - 0.9    Right ventricle  Value           Ref  EDD minor ax, A4C base (L)      2.4    cm       2.5 - 4.1  TAPSE, MM               (N)      2.4    cm       1.7 - 3.1  S' lateral             (N)      11.3   cm/sec   6.0 - 13.4    RVOT                            Value           Ref  Peak v, S                       0.66   m/sec    ----------    Left atrium                     Value           Ref  AP dim, ES             (H)      4.0    cm       2.7 - 3.8  AP dim index           (N)      2.0    cm/m^2   1.5 - 2.3  Area ES, A4C           (N)      17     cm^2     <=20  Area ES, A2C                    16     cm^2     ----------  SI dim, A2C                     4.6    cm       ----------  Vol, ES, 1-p A4C       (N)      45     ml       22 - 52  Vol/bsa, ES, 1-p A4C   (N)      22     ml/m^2   11 - 40  Vol, ES, 1-p A2C       (N)      49     ml       22 - 52  Vol/bsa, ES, 1-p A2C   (N)      24     ml/m^2   13 - 40  Vol, ES, 2-p                    51     ml       ----------  Vol/bsa, ES, 2-p       (N)      25     ml/m^2   16 - 34    Right atrium                    Value           Ref  Area, ES,  A4C          (L)      7      cm^2     10 - 18    Aortic valve                    Value           Ref  Peak v, S                       1.7    m/sec    ----------  Peak grad, S                    11     mm Hg    ----------  LVOT/AV, Vpeak ratio            0.54            ----------  AVA, Vmax                       1.7    cm^2     ----------  AVA/bsa, Vmax                   0.84   cm^2/m^2 ----------    Mitral valve                    Value           Ref  Peak E                          0.67   m/sec    ----------  Peak A                          1.04   m/sec    ----------  Decel slope                     256    cm/s^2   ----------  Decel time                      263    ms       ----------  PHT                             77     ms       ----------  Peak E/A ratio                  0.6             ----------  MVA, PHT                        2.9    cm^2     ----------  MVA/bsa, PHT                    1.41   cm^2/m^2 ----------    Pulmonic valve                   Value           Ref  Peak v, S  0.7    m/sec    ----------    Aortic root                     Value           Ref  Root diam              (N)      3.3    cm       <4.2  S-T junct diam, ED     (N)      2.6    cm       2.0 - 3.2  S-T junct diam/bsa, ED (N)      1.3    cm/m^2   1.1 - 1.9    Ascending aorta                 Value           Ref  AAo AP diam, S                  3.1    cm       ----------  AAo AP diam/bsa, S              1.5    cm/m^2   ----------    Inferior vena cava              Value           Ref  Diam                            1.5    cm       ----------    Legend:  (L)  and  (H)  mark values outside specified reference range.    (N)  marks values inside specified reference range.    Reviewed and confirmed by    Doreene Eland, MD  2022-08-11T16:30:45      Last Stress test:  No results found for this or any previous visit.       Last Cardiac MRI test:  09/12/2020  IMPRESSIONS:     Overall there is normal right and left ventricular size, function, and wall motion.     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     Recommend patient returns for contrasted portion of study to risk stratify presence of mitral annular disjunction.     1. The left ventricular size is normal. The left ventricular ejection fraction is 57 % by Simpson's method. Global left ventricular function is normal. There are no regional wall motion abnormalities of the left ventricular wall.     2. There is no evidence of increased iron deposition within the myocardium (T2*myocardium = 33 msec; if < 20 msec, suggestive of iron-overloading of the myocardium).     3. The right ventricular size is normal. Global right ventricular function is normal.     4. Left atrial size is normal. Right atrial size is normal.     5. Velocity-encoded phase contrast Imaging in (2D/4D) was performed. The Qp/Qs is 1.1 by phase contrast imaging and  is not consistent with evidence of shunt.     6. Velocity-encoded phase contrast Imaging in (2D/4D) was performed.   Peak velocity at the aortic valve is 1.3 corresponding to a peak gradient of 6.76 mmHg. There is trivial aortic regurgitation. The calculated  aortic regurgitant fraction is 1.69 %.   There is mild mitral regurgitation. The calculated mitral regurgitant fraction is 18.06 %. There is mild prolapse of the posterior mitral valve leaflet and evidence of mitral annular disjunction.   There is ??tricuspid regurgitation. The calculated tricuspid regurgitant fraction is 3.13 %. The calculated pulmonic regurgitant fraction is 0.0 %.     7. 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        Last Cath:  No results found for this or any previous visit.

## 2020-09-18 ENCOUNTER — Ambulatory Visit: Admit: 2020-09-18 | Discharge: 2020-09-18 | Payer: PRIVATE HEALTH INSURANCE | Attending: "Endocrinology

## 2020-09-18 DIAGNOSIS — M8580 Other specified disorders of bone density and structure, unspecified site: Secondary | ICD-10-CM

## 2020-09-18 NOTE — Unmapped (Addendum)
Switch to calcium citrate supplements.    - check calcium and PTH 2-3 weeks after    - if PTH is still elevated, we wills start calcitriol.     Continue Trulicity 4.5mg  weekly  Continue levothyroxine daily  Continue ezetimibe 10mg  daily    DXA scan in 1  year    Follow-up in 1 year.

## 2020-09-18 NOTE — Unmapped (Signed)
Patient ID:  60 y.o. female who presents for management of osteopenia, diabetes and hypothyroidism    This was a Video visit, including two-way audio and video communication, in lieu of an in-person visit. The patient provided verbal consent to participate in the telehealth visit.   I spent 35 minutes speaking with the patient, conducting an interview, performing a limited exam, and educating the patient on my assessment and plan. I also spent 10 minutes, on the same day as the encounter, preparing to see the patient (eg, review of tests), ordering medications, tests, or procedures, documenting clinical information in the electronic or other health record and performing non-face-to-face activities.    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       She reported being diagnosed with osteopenia in 2010 on outside DXA scan. Her fracture history includes a fractured L tibia and fibula in 2017 from falling down 2 steps after passing out.   DXA scan on 10/18/17 showed lowest T-score of -1.9 at the L femoral neck, and -0.7, +0.1, and -0.7 at the lumbar spine, L total hip and L 1/3 radius, respectively.     She has a history of vitamin D deficiency and secondary hyperparathyroidism, which had improved with vitamin D supplementation.  Labs on 06/02/17 showed vitamin D=10.5 and PTH=151pg/mL on 06/02/2017.  She was prescribed ergocalciferol but could not tolerate and was switched to vitamin D3 5000units daily.  Vitamin D=28.3 on 08/25/17.  Labs on 11/10/17 showed vitD=37.7ng/mL, and PTH=79pg/mL.  Labs on 10/19/18 showed Ca=9.2mg /dL, ZOX=0.96EAV/WU, JWJ=191YN/WG, A1c=7.4%.    She also has a history of hypothyroidism d/t hashimoto's disease on levothyroxine. She saw Dr. Angelia Mould on 06/29/2017, who performed in-office ultrasound that showed heterogenous thyroid with no discrete  nodule, c/w thyroiditis.     She was diagnosed with prediabetes in 2017, but had rapid weight gain in early 2019 and progressed to Type 2 diabetes with A1c=7.3% on 06/09/17.  So she was started on metformin 500mg  BID. A1c=6.7% on 10/12/17   Outside A1c= 5.5% on 01/31/18    MRI on 12/13/18 showed increased visceral fat and hepatic steatosis.     In 10/2018 she discontinued metformin and titrated trulicity up to 1.5mg  per week.  A1c=6.6% on 05/17/19    Labs on 10/09/19 showed Ca=9.0mg /dL, NFA=2.1H/YQ, MVHQ=46.9GE/XB, MWU=132GM/WN, TSH=7.70uIU/mL, FT4=0.78ng/dL, UU7=25.3GU/YQ.      She was increased to levothyroxine daily on 10/16/19.     DXA scan on 10/09/19 showed lowest T-score of -2.3 at the L femoral neck, and -1.0, -0.1, and -0.4 at the lumbar spine, L total hip, and the L 1/3 radius, respectively.    Her A1c=6.5% on 10/09/19    She increased to dulaglutide 3.0mg  weekly in 10/2019.   A1c=6.6% on 03/24/20.     Interval History:    Since her previous appt on 04/03/20, she increased trulicity to 4.5mg  weekly and is tolerating it well. She denies having GI symptoms.   She started ezetimibe 10mg  daily, and tolerates well.  She denies having myalgias.     She tried to increase her physical activity but was limited by dyspnea. She was hospitalized 08/28/20-08/29/20 for dyspnea, and was found to have some valvular disease and CAD.     She has lost 17lbs. She is trying to restrict her eating to 8hrs per day.  She checks her BG daily and reports AM  BGs of 70-100mg /dL.   A1=6.1% on 08/12/20    She continues to take levothyroxine daily.     She is taking calcium+D3 600mg /1600u BID with food.  She consumes 1-2 servings of calcium rich foods per day, mostly in the form of daily.     Labs on 08/12/20 showed Ca=9.3mg /dL, WGNF=6.$OZHYQMVHQIONGEXB_MWUXLKGMWNUUVOZDGUYQIHKVQQVZDGLO$$VFIEPPIRJJOACZYS_AYTKZSWFUXNATFTDDUKGURKYHCWCBJSE$ /dL, GBT=5.1V/Lyman, YWVP=71.0GY/IR, SWN=462VO/JJ, Tchol=188mg /dL, Trig=204mg /dL, HDL=38mg /dL, and LDL=109mg /dL.     She denies having any falls or fractures.      She reported previously having a reaction  to aspirin, and is concerned about fish oil for histamine content.     PMH:  - Mast cell activation syndrome  - CVID  - osteopenia  - hypothyroidism d/t hashimotos's  - Type 2 diabetes mellitus    Family Hx:  Paternal grandmother - CADw/MI s/p CABG  Maternal grandfather - CADw/MI  Maternal uncle - CADw/MI  Maternal aunt - mastoctytosis, osteoporosis  Mom- thyroid cancer, hypothyroid, mastocytosis, HLD, osteoporosis  Niece- thyroid cancer, hypothyroid  Nephew- thyroid cancer, hypothyroid  Brother -  Type 1 diabetes, HLD, CADw/MI s/p CABG PCI     Social Hx:  - lives in [place], [place]  - No smoking, EtOH or illicit drugs    Current Outpatient Medications   Medication Sig Dispense Refill   ??? azelastine (ASTELIN) 137 mcg (0.1 %) nasal spray Use into each nostril.     ??? blood-glucose meter Misc Use meter to test blood glucose twice daily 1 each 0   ??? budesonide (PULMICORT) 0.5 mg/2 mL Take 0.5 mg by mouth.     ??? busPIRone (BUSPAR) 10 MG tablet Take 2 tablets (20 mg total) by mouth in the morning and at bedtime. Indications: Generalized Anxiety Disorder 360 tablet 0   ??? cetirizine (ZYRTEC) 10 mg Cap Take 10 mg by mouth daily. 30 capsule 2   ??? cholecalciferol, vitamin D3, 125 mcg (5,000 unit) capsule Take 1 capsule (5,000 Units total) by mouth every Monday, Wednesday, and Friday. 40 capsule 3   ??? clonazePAM (KLONOPIN) 0.5 MG tablet Take 1 tablet (0.5 mg total) by mouth 2 times a day as needed for Anxiety. 60 tablet 2   ??? cromolyn (GASTROCROM) 100 mg/5 mL solution Take 10 mLs (200 mg total) by mouth 4 times daily before meals and at bedtime. 480 mL 11   ??? diphenhydrAMINE (BENADRYL) 25 mg capsule Take 3-6 capsules (75-150 mg total) by mouth daily. 90 capsule 2   ??? doxepin (SINEQUAN) 10 MG capsule TAKE ONE (1) CAPSULE BY MOUTH AT BEDTIME. 30 capsule 0   ??? doxycycline (MONODOX) 100 MG capsule Take 1 capsule (100 mg total) by mouth 2 times a day. 30 capsule 1   ??? dulaglutide 4.5 mg/0.5 mL PnIj Inject 4.5 mg subcutaneously every  7 days. 2 mL 11   ??? EPINEPHrine (EPI-PEN) 0.3 mg/0.3 mL AtIn injection Inject into the muscle.     ??? ezetimibe (ZETIA) 10 mg tablet TAKE ONE TABLET BY MOUTH EVERY DAY 30 tablet 3   ??? famotidine (PEPCID) 40 MG tablet Take 1 tablet (40 mg total) by mouth 2 times a day. 180 tablet 3   ??? fexofenadine (ALLEGRA) 180 MG tablet Take 1 tablet (180 mg total) by mouth daily. 30 tablet 3   ??? fluticasone propionate (FLONASE) 50 mcg/actuation nasal spray Use 1 spray into each nostril daily. 16 g 1   ??? furosemide (LASIX) 20 MG tablet Take 1 tablet (20 mg total) by mouth daily. 90 tablet 3   ??? hydrOXYchloroQUINE (PLAQUENIL) 200 mg tablet  Take by mouth daily with breakfast.     ??? hydrOXYzine HCL (ATARAX) 25 MG tablet Take 2 tablets (50 mg total) by mouth at bedtime as needed for Itching. Once daily at nighttime 30 tablet 3   ??? levalbuterol (XOPENEX) 1.25 mg/3 mL nebulizer solution Inhale 3 mL (1.25 mg total) by nebulization every 6 hours as needed for Wheezing. 72 mL 0   ??? levothyroxine (SYNTHROID) 175 MCG tablet TAKE 1 TABLET BY MOUTH ONCE DAILY. 90 tablet 3   ??? montelukast (SINGULAIR) 10 mg tablet TAKE ONE TABLET BY MOUTH AT BEDTIME. 30 tablet 0   ??? mupirocin (BACTROBAN) 2 % ointment Apply topically 3 times a day. 15 g 3   ??? nebulizers (COMPACT COMPRESSOR NEBULIZER) Misc Please dispense 1 nebulizer with tubing and mouthpiece. 1 each 0   ??? omeprazole (PRILOSEC) 40 MG capsule Take by mouth. Twice a day     ??? onabotulinumtoxinA (BOTOX COSMETIC) 50 unit SolR Inject into the muscle.     ??? triamcinolone (KENALOG) 0.1 % ointment APPLY 2 TIMES DAILY 80 g 11   ??? TRUE METRIX GLUCOSE TEST STRIP Strp USE TO TEST BLOOD SUGAR 2 TIMES DAILY 100 strip 3   ??? TRUEPLUS LANCETS 30 gauge Misc USE TWICE A DAY AS DIRECTED. 100 each 3   ??? UNABLE TO FIND Rx for Therapeutic Matrress for prevention of migraine d/t cervicogenic trigger from poor muscle alignment of back and neck with regular mattress.  She gets regular physical therapy and it helps somewhat  but therapeutic mattress is recommended. She also gets Botox injections every 3 months but if a therapeutic mattress can help reduce her headache burden, this may be able to be stretched out to every 4 months or so.  She has severe Mast Cell Disorder and being in the office for the injections can flare her Mast Cell and cause severe reactions just from the environment. 1 each 0     No current facility-administered medications for this visit.     Objective:  Physical Exam:  Gen: caucasian female  in NAD, alert, cooperative   Psych: normal speech and mood, appropriate thought content    Remaining exam deferred due to televisit     Labs:  Component      Latest Ref Rng & Units 04/02/2020 05/01/2020 08/12/2020 08/29/2020   Creatinine      0.60 - 1.30 mg/dL  0.98 1.19 1.47   Glucose      70 - 100 mg/dL  83 79 829 (H)   Calcium      8.6 - 10.3 mg/dL  9.1 9.3 8.8   Phosphorus      2.1 - 4.7 mg/dL  3.9 4.1 2.9   Albumin      3.5 - 5.7 g/dL  4.1 4.3 3.8   Calculated Osmolality, Serum      278 - 305 mOsm/kg  298 293 291   EGFR        79 72 >90   Cholesterol, Total      0 - 200 mg/dL   562    Triglycerides, Serum      10 - 149 mg/dL   130 (H)    HDL      60 - 92 mg/dL   38 (L)    LDL Cholesterol      mg/dL   865    Hemoglobin H8I      4.0 - 5.6 % 6.6  6.1 (H)    Free T4  0.61 - 1.76 ng/dL  1.61     TSH      0.96 - 4.12 uIU/mL  0.91     Vit D, 25-Hydroxy      30.0 - 100.0 ng/mL  55.0 61.5    PTH      12.0 - 88.0 pg/mL  128.0 (H) 127.0 (H)    Magnesium      1.5 - 2.5 mg/dL    2.1       Imaging:      Date LS Left FN Left TH Right FN Right TH 1/3 FA     10/09/19 BMD:0.933g/cm2  T-score: -1.0 SD   Z-score: 0.2 SD BMD:0.589g/cm2   T-score: -2.3 ??SD   Z-score: -1.1 SD BMD: 0.926g/cm2   T-score: -0.1 SD   Z-score: 0.7 SD BMD: 0.638 g/cm2   T-score: -1.9 SD   Z-score: -0.7 SD  BMD: 0.863g/cm2   T-score: -0.7 SD   Z-score: 0.2 SD  BMD: 0.668g/cm2   T-score: -0.4 SD   Z-score: 0.7 SD      10/18/17 BMD:0.970g/cm2  T-score: -0.7 SD    Z-score: 0.5 SD BMD:0.633 g/cm2  T-score: -1.9  SD  Z-score: -0.8 SD BMD:0.953 g/cm2   T-score: 0.1 SD   Z-score: 0.8 SD  BMD: 0.651 g/cm2  T-score: -1.8  SD  Z-score: -0.7 SD BMD: 0.877g/cm2   T-score: -0.5 SD   Z-score: 0.2 SD BMD: 0.650 g/cm2  T-score: -0.7  SD  Z-score: 0.3 SD     Assessment/Plan:  60 y.o. female with osteopenia, hypothyroidism, hyperparathyroidism, and Type 2 diabetes.    Emily Bonilla has bone density in the osteopenic range and distant hx of tibia fracture in 2017.  While she has multiple risk factors for low bone density, her estimated risk for fracture by FRAX remains below the threshold for treatment.  Therefore we will not start pharmacologic therapy for her bones at this time.  She is due for another DXA scan in 10/2021.       She has elevated PTH with normal serum calcium levels c/w secondary hyperparathyroidism likely due to inadequate calcium absorption.  She was advised to switch her supplement to calcium citrate to improve absorption.  We will recheck labs in 2-3 weeks, and if PTH is not improve we will start calcitriol.       She also has hypothyroidism d/t hashimoto's disease.  Her TFTs were in the reference ranged on recent labs.  Therefore we will continue levothyroxine daily.    She has good glycemic control as indicated by her A1c=6.6%.  She is tolerating trulicity 4.5mg  weekly with out adverse effects and even had some success with weight loss.  Therefore we will continue the current dose.  Her LDL and triglycerides have improved, but is not optimal especially in like of her significant family history of cardiovascular disease.  We discussed other lipid lowering agents, but she has not tolerated statins in the past, is concerned about histamine content of fish oils, and had a mast cell reaction to aspirin in the past.  Therefore we will not add any agents at this time.     Plan:  Switch to calcium citrate supplements.    - check calcium and PTH 2-3 weeks after    - if  PTH is still elevated, start calcitriol.     Continue Trulicity 4.5mg  weekly  Continue levothyroxine daily  Continue ezetimibe 10mg  daily    DXA scan in 1  year    Follow-up  in 1 year.    The past history was reviewed and copied forward (with edits) from a my note written on 04/03/20. I have reviewed and updated the history, physical exam, data, assessment, and plan of the note so that it reflects my current evaluation and management of the patient.

## 2020-09-24 MED ORDER — ezetimibe (ZETIA) 10 mg tablet
10 | ORAL_TABLET | Freq: Every day | ORAL | 3 refills | Status: AC
Start: 2020-09-24 — End: 2020-09-26

## 2020-09-25 ENCOUNTER — Ambulatory Visit: Payer: PRIVATE HEALTH INSURANCE

## 2020-09-25 NOTE — Unmapped (Signed)
Patient is coming for a PET on Monday.  She is going to [facility] here.  She will get registered in the main lobby and then go to the radiology dept.  The PET staff will take her back to the PET dept.  Patient is to be caffeine free 24 hrs before the test.  She is to be NPO 6 hrs before the test, but is diabetic.  She will bring juice with her.  She has had many reactions in the past mast cell reactions.  She is concerned even the stress of the PET may cause her to have a reaction.  She will reach out to Dr. Welton Flakes about whether she needs solumedrol before coming.  Patient verbalizes understanding of instructions.

## 2020-09-26 MED ORDER — ezetimibe (ZETIA) 10 mg tablet
10 | ORAL_TABLET | Freq: Every day | ORAL | 3 refills | Status: AC
Start: 2020-09-26 — End: ?

## 2020-09-26 MED ORDER — ezetimibe (ZETIA) 10 mg tablet
10 | ORAL_TABLET | ORAL | 11 refills | Status: AC
Start: 2020-09-26 — End: 2020-09-26

## 2020-09-26 NOTE — Unmapped (Signed)
Last office visit with this provider: 09/18/2020 Vincent Waishun Fong, MD  Last office visit at this location and with whom: 09/18/2020 Vincent Waishun Fong, MD  Next scheduled office visit at this location and with whom: Visit date not found

## 2020-09-26 NOTE — Unmapped (Signed)
Last office visit with this provider: 09/18/2020 Orbie Pyo, MD  Last office visit at this location and with whom: 09/18/2020 Orbie Pyo, MD  Next scheduled office visit at this location and with whom: Visit date not found

## 2020-09-29 ENCOUNTER — Inpatient Hospital Stay
Admit: 2020-09-29 | Payer: PRIVATE HEALTH INSURANCE | Attending: Student in an Organized Health Care Education/Training Program

## 2020-09-29 ENCOUNTER — Ambulatory Visit: Payer: PRIVATE HEALTH INSURANCE | Attending: Audiologist-Hearing Aid Fitter

## 2020-09-29 ENCOUNTER — Inpatient Hospital Stay
Admit: 2020-09-29 | Discharge: 2020-09-29 | Disposition: A | Discharge status: Home / Self Care | Payer: PRIVATE HEALTH INSURANCE

## 2020-09-29 DIAGNOSIS — R0789 Other chest pain: Secondary | ICD-10-CM

## 2020-09-29 DIAGNOSIS — R0600 Dyspnea, unspecified: Secondary | ICD-10-CM

## 2020-09-29 LAB — BASIC METABOLIC PANEL
Anion Gap: 13 mmol/L (ref 3–16)
BUN: 14 mg/dL (ref 7–25)
CO2: 23 mmol/L (ref 21–33)
Calcium: 9.6 mg/dL (ref 8.6–10.3)
Chloride: 105 mmol/L (ref 98–110)
Creatinine: 0.96 mg/dL (ref 0.60–1.30)
EGFR: 68
Glucose: 108 mg/dL (ref 70–100)
Osmolality, Calculated: 293 mOsm/kg (ref 278–305)
Potassium: 5 mmol/L (ref 3.5–5.3)
Sodium: 141 mmol/L (ref 133–146)

## 2020-09-29 LAB — DIFFERENTIAL
Basophils Absolute: 83 /uL (ref 0–200)
Basophils Relative: 0.7 % (ref 0.0–1.0)
Eosinophils Absolute: 177 /uL (ref 15–500)
Eosinophils Relative: 1.5 % (ref 0.0–8.0)
Lymphocytes Absolute: 3469 /uL (ref 850–3900)
Lymphocytes Relative: 29.4 % (ref 15.0–45.0)
Monocytes Absolute: 696 /uL (ref 200–950)
Monocytes Relative: 5.9 % (ref 0.0–12.0)
Neutrophils Absolute: 7375 /uL (ref 1500–7800)
Neutrophils Relative: 62.5 % (ref 40.0–80.0)
nRBC: 0 /100 WBC (ref 0–0)

## 2020-09-29 LAB — CBC
Hematocrit: 42 % (ref 35.0–45.0)
Hemoglobin: 14 g/dL (ref 11.7–15.5)
MCH: 27.3 pg (ref 27.0–33.0)
MCHC: 33.3 g/dL (ref 32.0–36.0)
MCV: 82.1 fL (ref 80.0–100.0)
MPV: 8.9 fL (ref 7.5–11.5)
Platelets: 333 10*3/uL (ref 140–400)
RBC: 5.11 10*6/uL (ref 3.80–5.10)
RDW: 14.5 % (ref 11.0–15.0)
WBC: 11.8 10*3/uL (ref 3.8–10.8)

## 2020-09-29 LAB — ED BLOOD GAS PANEL, VENOUS
%HBO2, Venous: 21.3 % (ref 40.0–70.0)
Base Excess, Ven: 2.7 mmol/L (ref <=3.0)
CO2 Content, Venous: 28 mmol/L (ref 25–29)
Carboxyhemoglobin, Venous: 0.8 % (ref 0.0–2.0)
Free Calcium, WB: 4.56 mg/dL (ref 4.50–5.30)
Glucose: 113 mg/dL (ref 70–100)
HCO3, Ven: 27 mmol/L (ref 24–28)
Hematocrit. Blood Gas Panel: 46 % (ref 35–45)
Hemoglobin, Blood Gas Panel: 15 g/dL (ref 12.0–16.0)
Lactate, Ven: 2.3 mmol/L (ref 0.5–1.6)
Methemoglobin, Venous: 0.5 % (ref 0.0–1.5)
Potassium: 4.9 mmol/L (ref 3.5–5.3)
Reduced hemoglobin, Venous: 77.4 % (ref 0.0–5.0)
Sodium: 141 mmol/L (ref 136–146)
pCO2, Ven: 38 mm Hg (ref 41–51)
pH, Ven: 7.45 (ref 7.32–7.42)
pO2, Ven: 17 mm Hg (ref 25–40)

## 2020-09-29 LAB — BLOOD BANK SAMPLE - NO ORDERS

## 2020-09-29 LAB — B NATRIURETIC PEPTIDE: BNP: 7 pg/mL (ref 0–100)

## 2020-09-29 LAB — HIGH SENSITIVITY TROPONIN: High Sensitivity Troponin: 4 ng/L (ref 0–14)

## 2020-09-29 LAB — MAGNESIUM: Magnesium: 2.3 mg/dL (ref 1.5–2.5)

## 2020-09-29 MED ORDER — rubidium-82 chloride (Rb-82) injection 36.33 millicurie
Freq: Once | INTRAVENOUS | Status: AC | PRN
Start: 2020-09-29 — End: 2020-09-29
  Administered 2020-09-29: 16:00:00 36.33 via INTRAVENOUS

## 2020-09-29 MED ORDER — aminophylline 250 mg/10 mL injection 50-100 mg
250 | Freq: Once | INTRAVENOUS | Status: AC | PRN
Start: 2020-09-29 — End: 2020-09-29
  Administered 2020-09-29: 16:00:00 100 mg via INTRAVENOUS

## 2020-09-29 MED ORDER — aminophylline 250 mg/10 mL injection
250 | INTRAVENOUS | Status: AC
Start: 2020-09-29 — End: 2020-09-29

## 2020-09-29 MED ORDER — diphenhydrAMINE (BENADRYL) 50 mg/mL injection
50 | INTRAMUSCULAR | Status: AC
Start: 2020-09-29 — End: 2020-09-30

## 2020-09-29 MED ORDER — methylPREDNISolone sod suc(PF) (SOLU-medrol) SolR 125 mg
125 | Freq: Once | INTRAMUSCULAR | Status: AC | PRN
Start: 2020-09-29 — End: 2020-09-29

## 2020-09-29 MED ORDER — diphenhydrAMINE (BENADRYL) injection 50 mg
50 | Freq: Once | INTRAMUSCULAR | Status: AC | PRN
Start: 2020-09-29 — End: 2020-09-29

## 2020-09-29 MED ORDER — EPINEPHrine 1 mg/mL injection
1 | Freq: Once | INTRAMUSCULAR | Status: AC
Start: 2020-09-29 — End: 2020-09-29
  Administered 2020-09-29: 16:00:00 0.3 mg via INTRAMUSCULAR

## 2020-09-29 MED ORDER — sodium chloride flush 5 mL
Freq: Once | INTRAMUSCULAR | Status: AC
Start: 2020-09-29 — End: 2020-09-29
  Administered 2020-09-29: 16:00:00 5 mL via INTRAVENOUS

## 2020-09-29 MED ORDER — diphenhydrAMINE (BENADRYL) 50 mg/mL injection
50 | INTRAMUSCULAR | Status: AC
Start: 2020-09-29 — End: 2020-09-29

## 2020-09-29 MED ORDER — albuterol (PROVENTIL) nebulizer solution 5 mg
2.5 | Freq: Once | RESPIRATORY_TRACT | Status: AC
Start: 2020-09-29 — End: 2020-09-29
  Administered 2020-09-29: 16:00:00 5 mg via RESPIRATORY_TRACT

## 2020-09-29 MED ORDER — methylPREDNISolone sod suc(PF) (SOLU-medrol) SolR 125 mg
125 | Freq: Once | INTRAMUSCULAR | Status: AC
Start: 2020-09-29 — End: 2020-09-29
  Administered 2020-09-29: 15:00:00 125 mg via INTRAVENOUS

## 2020-09-29 MED ORDER — methylPREDNISolone sod suc(PF) (SOLU-medrol) SolR 125 mg
125 | Freq: Once | INTRAMUSCULAR | Status: AC
Start: 2020-09-29 — End: 2020-09-30

## 2020-09-29 MED ORDER — methylPREDNISolone sod suc(PF) (SOLU-medrol) 125 mg/2 mL SolR
125 | INTRAMUSCULAR | Status: AC
Start: 2020-09-29 — End: 2020-09-29

## 2020-09-29 MED ORDER — methylPREDNISolone sod suc(PF) (SOLU-medrol) SolR 125 mg
125 | Freq: Once | INTRAMUSCULAR | Status: AC
Start: 2020-09-29 — End: 2020-09-29
  Administered 2020-09-29: 16:00:00 125 mg via INTRAVENOUS

## 2020-09-29 MED ORDER — regadenoson (LEXISCAN) 0.4 mg/5 mL syringe
0.4 | INTRAVENOUS | Status: AC
Start: 2020-09-29 — End: 2020-09-29

## 2020-09-29 MED ORDER — famotidine (PF) (PEPCID) injection 20 mg
20 | Freq: Once | INTRAVENOUS | Status: AC
Start: 2020-09-29 — End: 2020-09-29
  Administered 2020-09-29: 16:00:00 20 mg via INTRAVENOUS

## 2020-09-29 MED ORDER — regadenoson (LEXISCAN) 0.4 mg/5 mL syringe 0.4 mg
0.4 | Freq: Once | INTRAVENOUS | Status: AC
Start: 2020-09-29 — End: 2020-09-29
  Administered 2020-09-29: 16:00:00 0.4 mg via INTRAVENOUS

## 2020-09-29 MED ORDER — diphenhydrAMINE (BENADRYL) injection 50 mg
50 | Freq: Once | INTRAMUSCULAR | Status: AC
Start: 2020-09-29 — End: 2020-09-29
  Administered 2020-09-29: 15:00:00 50 mg via INTRAVENOUS

## 2020-09-29 MED ORDER — diphenhydrAMINE (BENADRYL) injection 50 mg
50 | Freq: Once | INTRAMUSCULAR | Status: AC
Start: 2020-09-29 — End: 2020-09-30

## 2020-09-29 MED ORDER — rubidium-82 chloride (Rb-82) injection 36.34 millicurie
Freq: Once | INTRAVENOUS | Status: AC | PRN
Start: 2020-09-29 — End: 2020-09-29
  Administered 2020-09-29: 15:00:00 36.34 via INTRAVENOUS

## 2020-09-29 MED ORDER — diphenhydrAMINE (BENADRYL) injection 25 mg
50 | Freq: Once | INTRAMUSCULAR | Status: AC
Start: 2020-09-29 — End: 2020-09-29
  Administered 2020-09-29: 16:00:00 25 mg via INTRAVENOUS

## 2020-09-29 MED FILL — SOLU-MEDROL (PF) 125 MG/2 ML SOLUTION FOR INJECTION: 125 125 mg/2 mL | INTRAMUSCULAR | Qty: 2

## 2020-09-29 MED FILL — DIPHENHYDRAMINE 50 MG/ML INJECTION SOLUTION: 50 50 mg/mL | INTRAMUSCULAR | Qty: 1

## 2020-09-29 MED FILL — AMINOPHYLLINE 250 MG/10 ML INTRAVENOUS SOLUTION: 250 250 mg/10 mL | INTRAVENOUS | Qty: 10

## 2020-09-29 MED FILL — LEXISCAN 0.4 MG/5 ML INTRAVENOUS SYRINGE: 0.4 0.4 mg/5 mL | INTRAVENOUS | Qty: 5

## 2020-09-29 NOTE — Unmapped (Signed)
Department of Internal Medicine   AOD Rapid Response Note    Patient: Emily Bonilla  MRN: [id number]  Room: SR2A/SR2AU  Attending: Areatha Keas, MD  Primary Team:      Event Narrative     Rapid response & code blue was called on Emily Bonilla at approximately 12:28 PM. Patient admitted for No admission diagnoses are documented for this encounter.Marland Kitchen    Response was called due to shortness of breath/respiratory distress.  Patients vitals were notable for hypoxic on Venturi mask.  Exam was notable for patient in acute respiratory distress with wheezing audible without stethoscope, increased work of breathing with use of accessory respiratory muscle use. Patient was awake, alert, had pulse.     Assessment & Plan     #Acute respiratory distress:  #Mast cell activation syndrome:  Patient in significant acute respiratory distress. ICU MD already at bedside upon my arrival; patient was already being actively transferred to SRU in ED upon my arrival. On Venturi mask, Benadryl administered en route to ED. ED MDs in SRU immediately began stabilizing the patient with IV steroids, may need airway protection.    Disposition: SRU in ED    Humberto Seals, MD  Internal Medicine Resident  PGY-3  AOD

## 2020-09-29 NOTE — Unmapped (Addendum)
Stress test ordered :[facility] PET Nuclear Stress Test    Lexiscan    Ordering physician : Dr Pete Pelt    Attending/Fellow : Dr Corrie Dandy          Conducted by : Kaylyn Layer, RN    Consent obtained : Yes    Last meal : 4 Hours    Caffeine last ingested : 48 Hours    Patient on B-Blocker/Calcium channel blocker/Nitro : No      Patient information :    60 y.o.     female     Please note that all the stress test vitals are documented in [facility].    There is no height or weight on file to calculate BMI.     Hysterectomy    Activity :  3 - Walking on a flat surface for one or two blocks.    Indications : assessment of acute chest pain    Risk factors:diabetes mellitus, dyslipidemia, family history of premature cardiovascular disease, hypertension and obesity (BMI >= 30 kg/m2)    Previous MI: No  Previous stents or CABG : No  History of CHF : No  Asthma/COPD : Yes  TIA/CVA : No  PVD/Claudication : No  Sleep apnea: No    Allergies   Allergen Reactions   ??? Acetaminophen Anaphylaxis     Unknown   ??? Adhesive Tape-Silicones Itching, Rash, Swelling and Anaphylaxis     Unknown   ??? Alcohol Anaphylaxis   ??? Amitriptyline Anaphylaxis   ??? Aspirin Anaphylaxis     Unknown   ??? Benzoic Acid Anaphylaxis   ??? Blue Dye Anaphylaxis     Unknown   ??? Flecainide Shortness Of Breath and Anaphylaxis     Itching, decreased libido, wheezing, flushing, numbness.    ??? Gabapentin Anaphylaxis   ??? Iodinated Contrast Media Anaphylaxis   ??? Isopropyl Alcohol Anaphylaxis   ??? Latex Anaphylaxis and Rash   ??? Levofloxacin Itching and Anaphylaxis   ??? Lidocaine Anaphylaxis   ??? Nsaids (Non-Steroidal Anti-Inflammatory Drug) Anaphylaxis   ??? Onion Anaphylaxis   ??? 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  HAS MAST CELL ACTIVATION SYNDROME, SO PATIENT HAS ANAPHYLACTIC REACTIONS IF BODY RECOGNIZES TRIGGERS  CANNOT TOLERATE ANY DYES,  PRESERVATIVES, PARABENS, ETC.   ??? Paraben Anaphylaxis   ??? Perfume Anaphylaxis   ??? Pregabalin Anaphylaxis   ??? Preservative Anaphylaxis   ??? Quetiapine Anaphylaxis     Mental Status Change  Other reaction(s): Angioedema   ??? Red Dye Anaphylaxis     Unknown   ??? Sulfa (Sulfonamide Antibiotics) Anaphylaxis   ??? Sulfur Anaphylaxis     Pt states: Any alcohol product   ??? Trazodone Rash and Anaphylaxis   ??? Verapamil Anaphylaxis   ??? Yellow Dye Anaphylaxis     Unknown   ??? Green Dye Rash     Other reaction(s): Contact Dermatitis   ??? Onion Extract Other (See Comments)   ??? Doxepin Other (See Comments)     Sedation and sleep paralysis (side effect)   ??? Duloxetine      Other reaction(s): Other (See Comments)  Mental Status Change   ??? Epi E-Z Pen    ??? Hydroxyzine Itching     Unknown   ??? Statins-Hmg-Coa Reductase Inhibitors    ??? Verapamil (Bulk) Hives     Insomnia, flushing, scattered brain, migraine, itching, anxiety.  Current Outpatient Medications on File Prior to Encounter   Medication Sig Dispense Refill   ??? azelastine (ASTELIN) 137 mcg (0.1 %) nasal spray Use into each nostril.     ??? blood-glucose meter Misc Use meter to test blood glucose twice daily 1 each 0   ??? budesonide (PULMICORT) 0.5 mg/2 mL Take 0.5 mg by mouth.     ??? busPIRone (BUSPAR) 10 MG tablet Take 2 tablets (20 mg total) by mouth in the morning and at bedtime. Indications: Generalized Anxiety Disorder 360 tablet 0   ??? cetirizine (ZYRTEC) 10 mg Cap Take 10 mg by mouth daily. 30 capsule 2   ??? cholecalciferol, vitamin D3, 125 mcg (5,000 unit) capsule Take 1 capsule (5,000 Units total) by mouth every Monday, Wednesday, and Friday. 40 capsule 3   ??? clonazePAM (KLONOPIN) 0.5 MG tablet Take 1 tablet (0.5 mg total) by mouth 2 times a day as needed for Anxiety. 60 tablet 2   ??? cromolyn (GASTROCROM) 100 mg/5 mL solution Take 10 mLs (200 mg total) by mouth 4 times daily before meals and at bedtime. 480 mL 11   ??? diphenhydrAMINE (BENADRYL) 25 mg capsule Take 3-6  capsules (75-150 mg total) by mouth daily. 90 capsule 2   ??? doxepin (SINEQUAN) 10 MG capsule TAKE ONE (1) CAPSULE BY MOUTH AT BEDTIME. 30 capsule 0   ??? doxycycline (MONODOX) 100 MG capsule Take 1 capsule (100 mg total) by mouth 2 times a day. 30 capsule 1   ??? dulaglutide 4.5 mg/0.5 mL PnIj Inject 4.5 mg subcutaneously every 7 days. 2 mL 11   ??? EPINEPHrine (EPI-PEN) 0.3 mg/0.3 mL AtIn injection Inject into the muscle.     ??? ezetimibe (ZETIA) 10 mg tablet Take 1 tablet (10 mg total) by mouth daily. 90 tablet 3   ??? famotidine (PEPCID) 40 MG tablet Take 1 tablet (40 mg total) by mouth 2 times a day. 180 tablet 3   ??? fexofenadine (ALLEGRA) 180 MG tablet Take 1 tablet (180 mg total) by mouth daily. 30 tablet 3   ??? fluticasone propionate (FLONASE) 50 mcg/actuation nasal spray Use 1 spray into each nostril daily. 16 g 1   ??? furosemide (LASIX) 20 MG tablet Take 1 tablet (20 mg total) by mouth daily. 90 tablet 3   ??? hydrOXYchloroQUINE (PLAQUENIL) 200 mg tablet Take by mouth daily with breakfast.     ??? hydrOXYzine HCL (ATARAX) 25 MG tablet Take 2 tablets (50 mg total) by mouth at bedtime as needed for Itching. Once daily at nighttime 30 tablet 3   ??? levalbuterol (XOPENEX) 1.25 mg/3 mL nebulizer solution Inhale 3 mL (1.25 mg total) by nebulization every 6 hours as needed for Wheezing. 72 mL 0   ??? levothyroxine (SYNTHROID) 175 MCG tablet TAKE 1 TABLET BY MOUTH ONCE DAILY. 90 tablet 3   ??? montelukast (SINGULAIR) 10 mg tablet TAKE ONE TABLET BY MOUTH AT BEDTIME. 30 tablet 0   ??? mupirocin (BACTROBAN) 2 % ointment Apply topically 3 times a day. 15 g 3   ??? nebulizers (COMPACT COMPRESSOR NEBULIZER) Misc Please dispense 1 nebulizer with tubing and mouthpiece. 1 each 0   ??? omeprazole (PRILOSEC) 40 MG capsule Take by mouth. Twice a day     ??? onabotulinumtoxinA (BOTOX COSMETIC) 50 unit SolR Inject into the muscle.     ??? triamcinolone (KENALOG) 0.1 % ointment APPLY 2 TIMES DAILY 80 g 11   ??? TRUE METRIX GLUCOSE TEST STRIP Strp USE TO TEST  BLOOD SUGAR 2 TIMES  DAILY 100 strip 3   ??? TRUEPLUS LANCETS 30 gauge Misc USE TWICE A DAY AS DIRECTED. 100 each 3   ??? UNABLE TO FIND Rx for Therapeutic Matrress for prevention of migraine d/t cervicogenic trigger from poor muscle alignment of back and neck with regular mattress.  She gets regular physical therapy and it helps somewhat but therapeutic mattress is recommended. She also gets Botox injections every 3 months but if a therapeutic mattress can help reduce her headache burden, this may be able to be stretched out to every 4 months or so.  She has severe Mast Cell Disorder and being in the office for the injections can flare her Mast Cell and cause severe reactions just from the environment. 1 each 0     No current facility-administered medications on file prior to encounter.         Clinical History : 60 yo female in for evaluation of chest pain. No C/O chest pain at this time.      Follow up Appointment :   Appropriate phone number to contact pt if positive results is [phone] .  If you do not have an appointment or hear from your physician within 1 week, please call your physicians office for results.  Pt verbalized understanding.      Pre-Exercise    Murmurs : No  Pulmonary Examination : Clear on auscultation.    Post Exercise    Murmurs : No  Pulmonary Examination : Clear    Post stress Comments: Pt identified by arm band, name and DOB and Timeout with all personal present.  Lexiscan 0.4 mg given IVP per protocol. Baseline EKG reviewed by Dr Maxcine Ham and ok to proceed with procedure.  Resting HR 80 and resting BP 131/79.  Stress HR 99 and stress BP 114/63.  No significant EKG changes noted.  Ending HR 84 and BP 118/57, no other symptoms verbalized.  Pt tolerated well post test and was pretreated before test with Solumedral 125 mg IV and Benadryl 50 mg IV per Dr Pete Pelt.

## 2020-09-29 NOTE — Unmapped (Signed)
Patient presents to the Emergency Department with report of wheezing and trouble breathing during a procedure in PET scan.  Patient is awake, A&OX4.  Patient speaks in broken sentences, but able to answer all questions appropriately.  Patient reports tightness in chest due to trouble breathing.  Skin warm and dry.  Respirations slightly labored.  Patient placed on cardiac monitor and continuous pulse oximetry.  ST noted without ectopy.  Patient received Solu-medrol, Benadryl, and Epi subcutaneous prior to arrival.  ED MD and RT at bedside for assessment, evaluation, and treatment.  Duonebs started per RT.  Patient tolerated all treatment well.

## 2020-09-29 NOTE — Unmapped (Signed)
Wheezing, shortness of breath [place] ED Note    Date of Service: 09/29/2020  Reason for Visit: Shortness of Breath and Wheezing      Patient History     HPI  Emily Bonilla is a 60 y.o. female with a past medical history significant for WPW, atrial fibrillation, seizures, [facility] cell degranulation syndrome [facility] presenting to the emergency department with concerns for possible anaphylaxis.  The patient was had a stress test today.  However after starting the stress test she had significant shortness of breath, wheezing, and concern for difficulties breathing.  Patient was given 25 mg IM Benadryl due to inability to obtain IV access and was brought here for further evaluation and management.    Other than stated above, no additional aggravating or alleviating factors are noted.    Past Medical History:   Diagnosis Date   ??? Anaphylaxis    ??? Anemia    ??? Anxiety    ??? Asthma    ??? Atrial fibrillation (CMS Dx)    ??? Bronchitis    ??? Heart disease    ??? Neurological disease    ??? OSA (obstructive sleep apnea)     states resolved   ??? Osteoporosis    ??? Psychogenic nonepileptic seizure    ??? Seizures (CMS Dx)    ??? Spontaneous pneumothorax     states pneumomediatsium/pneumopericardium, self resolved   ??? Thyroid disease    ??? Ulcer    ??? WPW (Wolff-Parkinson-White syndrome)      Past Surgical History:   Procedure Laterality Date   ??? BONE MARROW BIOPSY      benign   ??? CARDIAC ELECTROPHYSIOLOGY MAPPING AND ABLATION      attempted x 2, states not successful either time   ??? CHOLECYSTECTOMY  2015   ??? DENTAL EXTRACTION Right 04/25/2017    Procedure: EXTRACTION TOOTH # 31, BRIDGE RESECTIONING;  Surgeon: Gladys Damme, DMD;  Location: [facility] OR;  Service: Oral Surgery;  Laterality: Right;   ??? FACIAL COSMETIC SURGERY     ??? HYSTERECTOMY  2009   ??? INSERTION CATHETER VASCULAR ACCESS N/A 09/06/2017    Procedure: INSERTION POWER PORT A CATH;  Surgeon: Penni Bombard, MD;  Location: [facility] OR;  Service: General;  Laterality: N/A;   ??? LIVER  BIOPSY      benign hepatic adednoma per patient   ??? REMOVAL HARDWARE LEG Left 08/17/2016    Procedure: REMOVAL OF HARDWARE LEFT TIBIA;  Surgeon: Andee Lineman, MD;  Location: [facility] OR;  Service: Orthopedics;  Laterality: Left;   ??? TIBIA FRACTURE SURGERY     ??? TONSILLECTOMY      + adenoids     Patient  reports that she has never smoked. She has never used smokeless tobacco. She reports that she does not drink alcohol and does not use drugs.  Discharge Medication List as of 09/29/2020  2:47 PM      CONTINUE these medications which have NOT CHANGED    Details   azelastine (ASTELIN) 137 mcg (0.1 %) nasal spray Use into each nostril., Starting Tue 04/01/2020, Historical Med      blood-glucose meter Misc Use meter to test blood glucose twice daily, Normal, Disp-1 each, R-0      budesonide (PULMICORT) 0.5 mg/2 mL Take 0.5 mg by mouth., Historical Med      busPIRone (BUSPAR) 10 MG tablet Take 2 tablets (20 mg total) by mouth in the morning and at bedtime. Indications: Generalized Anxiety Disorder, Starting Thu 09/11/2020, Until  Wed 12/10/2020, Normal, Disp-360 tablet, R-0      cetirizine (ZYRTEC) 10 mg Cap Take 10 mg by mouth daily., Starting Thu 10/19/2018, Normal, Disp-30 capsule, R-2      cholecalciferol, vitamin D3, 125 mcg (5,000 unit) capsule Take 1 capsule (5,000 Units total) by mouth every Monday, Wednesday, and Friday., Starting Fri 10/19/2019, Normal, Disp-40 capsule, R-3      clonazePAM (KLONOPIN) 0.5 MG tablet Take 1 tablet (0.5 mg total) by mouth 2 times a day as needed for Anxiety., Starting Thu 09/11/2020, Until Wed 12/10/2020 at 2359, Normal, Disp-60 tablet, R-2      cromolyn (GASTROCROM) 100 mg/5 mL solution Take 10 mLs (200 mg total) by mouth 4 times daily before meals and at bedtime., Starting Wed 05/07/2020, Normal, Disp-480 mL, R-11      diphenhydrAMINE (BENADRYL) 25 mg capsule Take 3-6 capsules (75-150 mg total) by mouth daily., Starting Thu 10/19/2018, Normal, Disp-90 capsule, R-2      doxepin (SINEQUAN) 10  MG capsule TAKE ONE (1) CAPSULE BY MOUTH AT BEDTIME., Normal, Disp-30 capsule, R-0      doxycycline (MONODOX) 100 MG capsule Take 1 capsule (100 mg total) by mouth 2 times a day., Starting Thu 05/22/2020, Normal, Disp-30 capsule, R-1      dulaglutide 4.5 mg/0.5 mL PnIj Inject 4.5 mg subcutaneously every 7 days., Starting Thu 04/03/2020, Until Fri 04/03/2021, Normal, Disp-2 mL, R-11      EPINEPHrine (EPI-PEN) 0.3 mg/0.3 mL AtIn injection Inject into the muscle., Starting Tue 09/25/2019, Historical Med      ezetimibe (ZETIA) 10 mg tablet Take 1 tablet (10 mg total) by mouth daily., Starting Fri 09/26/2020, Normal, Disp-90 tablet, R-3      famotidine (PEPCID) 40 MG tablet Take 1 tablet (40 mg total) by mouth 2 times a day., Starting Wed 05/07/2020, Normal, Disp-180 tablet, R-3      fexofenadine (ALLEGRA) 180 MG tablet Take 1 tablet (180 mg total) by mouth daily., Starting Thu 10/19/2018, Normal, Disp-30 tablet, R-3      fluticasone propionate (FLONASE) 50 mcg/actuation nasal spray Use 1 spray into each nostril daily., Starting Thu 10/19/2018, Normal, Disp-16 g, R-1      furosemide (LASIX) 20 MG tablet Take 1 tablet (20 mg total) by mouth daily., Starting Fri 06/27/2020, Normal, Disp-90 tablet, R-3      hydrOXYchloroQUINE (PLAQUENIL) 200 mg tablet Take by mouth daily with breakfast., Historical Med      hydrOXYzine HCL (ATARAX) 25 MG tablet Take 2 tablets (50 mg total) by mouth at bedtime as needed for Itching. Once daily at nighttime, Starting Thu 09/11/2020, Normal, Disp-30 tablet, R-3      levalbuterol (XOPENEX) 1.25 mg/3 mL nebulizer solution Inhale 3 mL (1.25 mg total) by nebulization every 6 hours as needed for Wheezing., Starting Thu 10/19/2018, Normal, Disp-72 mL, R-0      levothyroxine (SYNTHROID) 175 MCG tablet TAKE 1 TABLET BY MOUTH ONCE DAILY., Normal, Disp-90 tablet, R-3      montelukast (SINGULAIR) 10 mg tablet TAKE ONE TABLET BY MOUTH AT BEDTIME., Normal, Disp-30 tablet, R-0      mupirocin (BACTROBAN) 2 % ointment  Apply topically 3 times a day., Starting Thu 04/17/2020, Normal, Disp-15 g, R-3      nebulizers (COMPACT COMPRESSOR NEBULIZER) Misc Please dispense 1 nebulizer with tubing and mouthpiece., Print, Disp-1 each, R-0      omeprazole (PRILOSEC) 40 MG capsule Take by mouth. Twice a day, Starting Thu 03/27/2020, Historical Med      onabotulinumtoxinA (BOTOX COSMETIC) 50 unit  SolR Inject into the muscle., Historical Med      triamcinolone (KENALOG) 0.1 % ointment APPLY 2 TIMES DAILY, Normal, Disp-80 g, R-11      TRUE METRIX GLUCOSE TEST STRIP Strp USE TO TEST BLOOD SUGAR 2 TIMES DAILY, Normal, Disp-100 strip, R-3      TRUEPLUS LANCETS 30 gauge Misc USE TWICE A DAY AS DIRECTED., Normal, Disp-100 each, R-3      UNABLE TO FIND Rx for Therapeutic Matrress for prevention of migraine d/t cervicogenic trigger from poor muscle alignment of back and neck with regular mattress.  She gets regular physical therapy and it helps somewhat but therapeutic mattress is recommended. She also  gets Botox injections every 3 months but if a therapeutic mattress can help reduce her headache burden, this may be able to be stretched out to every 4 months or so.  She has severe Mast Cell Disorder and being in the office for the injections can flare  her Mast Cell and cause severe reactions just from the environment., Print, Disp-1 each, R-0             Allergies:   Allergies as of 09/29/2020 - Fully Reviewed 09/29/2020   Allergen Reaction Noted   ??? Acetaminophen Anaphylaxis 11/17/2015   ??? Adhesive tape-silicones Itching, Rash, Swelling, and Anaphylaxis 09/27/2015   ??? Alcohol Anaphylaxis 10/10/2017   ??? Amitriptyline Anaphylaxis 01/04/2017   ??? Aspirin Anaphylaxis 09/27/2015   ??? Benzoic acid Anaphylaxis 07/20/2017   ??? Blue dye Anaphylaxis 11/17/2015   ??? Flecainide Shortness Of Breath and Anaphylaxis 02/06/2016   ??? Gabapentin Anaphylaxis 11/17/2015   ??? Iodinated contrast media Anaphylaxis 11/07/2015   ??? Isopropyl alcohol Anaphylaxis 11/07/2015   ??? Latex  Anaphylaxis and Rash 09/27/2015   ??? Levofloxacin Itching and Anaphylaxis 11/07/2015   ??? Lidocaine Anaphylaxis 09/27/2015   ??? Nsaids (non-steroidal anti-inflammatory drug) Anaphylaxis 11/07/2015   ??? Onion Anaphylaxis 11/17/2015   ??? Other Anaphylaxis 09/27/2015   ??? Paraben Anaphylaxis 07/20/2017   ??? Perfume Anaphylaxis 11/07/2015   ??? Pregabalin Anaphylaxis 09/27/2015   ??? Preservative Anaphylaxis 10/10/2017   ??? Quetiapine Anaphylaxis 11/07/2015   ??? Red dye Anaphylaxis 11/07/2015   ??? Sulfa (sulfonamide antibiotics) Anaphylaxis 07/20/2017   ??? Sulfur Anaphylaxis 11/25/2015   ??? Trazodone Rash and Anaphylaxis 11/07/2015   ??? Verapamil Anaphylaxis 07/20/2017   ??? Yellow dye Anaphylaxis 11/07/2015   ??? Green dye Rash 07/20/2017   ??? Onion extract Other (See Comments) 07/20/2017   ??? Doxepin Other (See Comments) 11/17/2015   ??? Duloxetine  11/17/2015   ??? Epi e-z pen  01/19/1996   ??? Hydroxyzine Itching 11/07/2015   ??? Statins-hmg-coa reductase inhibitors  10/10/2017   ??? Verapamil (bulk) Hives 02/06/2016       All nursing notes and triage notes were appropriately reviewed in the course of the creation of this note.     Review of Systems     Positive for wheezing, shortness of breath  Negative for fevers, chills, lightheadedness, chest pain, abdominal pain, weakness, numbness, tingling    A complete review of systems was performed and is otherwise negative.    Physical Exam     Vitals:    09/29/20 1220 09/29/20 1243 09/29/20 1307 09/29/20 1449   BP: 142/70 108/74 124/69 122/66   BP Location: Right arm      Patient Position: Lying      Pulse: 92 97 105 99   Resp: 20 18 16 18    Temp:       TempSrc:  SpO2: 99% 99% 97% 94%     General: Chronically ill-appearing with increased work of breathing.  Eyes:  Pupils reactive. No discharge from eyes   ENT:  No discharge from nose. OP clear. MMM  Neck:  Supple, trachea midline  Pulmonary:    Increased respiratory rate with upper airway sounds, no stridor or wheezing, otherwise CTAB.  Able to  speak in full sentences.   Cardiac:   Tachycardic, regular rhythm. No murmurs  Abdomen:  Soft. Non-tender. Non-distended  Musculoskeletal:  No long bone deformity.   Vascular:  Extremities warm and perfused. Normal pulses in all 4 extremities  Skin:  Dry, no rashes  Extremities:  No peripheral edema  Neuro:  Alert. Moves all four extremities to command. No focal deficit  Diagnostic Studies     Labs:  Labs Reviewed   BASIC METABOLIC PANEL - Abnormal; Notable for the following components:       Result Value    Glucose 108 (*)     All other components within normal limits   ED BLOOD GAS PANEL, VENOUS - Abnormal; Notable for the following components:    pH, Ven 7.45 (*)     pCO2, Ven 38 (*)     pO2, Ven 17 (*)     %HBO2, Venous 21.3 (*)     Reduced hemoglobin, Venous 77.4 (*)     Hematocrit. Blood Gas Panel 46.0 (*)     Glucose 113 (*)     Lactate, Ven 2.3 (*)     All other components within normal limits   CBC - Abnormal; Notable for the following components:    WBC 11.8 (*)     RBC 5.11 (*)     All other components within normal limits   B NATRIURETIC PEPTIDE    Narrative:     The presence of high concentrations of biotin may cause falsely lowered BNP results. Biotin interference may be seen if an individual is taking >5 mg biotin per day. Interpret BNP results in the context of the patient's clinical presentation.   DIFFERENTIAL   HIGH SENSITIVITY TROPONIN   MAGNESIUM   BLOOD BANK SAMPLE - NO ORDERS       Radiology:  No orders to display       EKG:  No EKG Performed  Emergency Department Procedures       ED Course and MDM     Roza Creamer is a 60 y.o. female with a history and presentation as described above in HPI.  The patient was evaluated by myself and the ED Attending Physician, Dr. Jobie Quaker. All management and disposition plans were discussed and agreed upon.    Upon presentation, the patient had increased work of breathing although was able to talk in complete sentences when arriving to the ED.  Due to  concern for possible anaphylactic reaction versus her mast cell granulation disease, the patient was given 1 round of IM epi along with an additional 25 mg IV Benadryl in the setting of 50 mg IM being given in route to the emergency department.  The patient was additionally given IV famotidine and Solu-Medrol with rapid and significant improvement in her symptoms.  On examination there was low concern for airway compromise in the setting of no stridor and clear breath sounds bilaterally, but with the patient having potential wheezes she was given a dose of albuterol.  The patient was never hypoxic in the emergency department or hypotensive, therefore continued measures for anaphylaxis were stopped at this time.  The patient was monitored for several hours without recurrence of her symptoms.  Is unlikely that this was due to anaphylaxis with the rapid improvement, no swelling or urticaria, and no obvious airway issues or blood pressure issues.  Additionally the patient had no GI symptoms including no vomiting, nausea, or diarrhea.  The patient already had multiple EpiPen's to go home with and was felt safe for discharge home without additional ones.  Is recommended the patient follow-up with her primary care provider within the next several days.    Medications received during this ED visit:  Medications   methylPREDNISolone sod suc(PF) (SOLU-medrol) SolR 125 mg (125 mg Intravenous Given 09/29/20 1217)   EPINEPHrine 1 mg/mL injection (0.3 mg Intramuscular Given 09/29/20 1213)   diphenhydrAMINE (BENADRYL) injection 25 mg (25 mg Intravenous Given 09/29/20 1213)   famotidine (PF) (PEPCID) injection 20 mg (20 mg Intravenous Given 09/29/20 1215)   albuterol (PROVENTIL) nebulizer solution 5 mg (5 mg Nebulization Given 09/29/20 1219)       At this time, the patient was deemed appropriate for discharge. My customary discharge instructions, including strict return precautions for new or worsening symptoms concerning to the  patient, were provided. All of patient's questions were answered satisfactorily, and she was subsequently sent home in stable condition.    Impression     1. Dyspnea, unspecified type         Plan   Discharge:  The patient is to be discharged home in stable/improved condition. Workup, treatment and diagnosis were discussed with the patient and/or family members; the patient agrees to the plan and all questions were addressed and answered. The patient is instructed to return to the emergency department should her symptoms worsen or any concern she believes warrants acute physician evaluation.        Rene Paci, MD, PGY-3  [facility]    This note was dictated using a voice recognition software which occasionally leads to inadvertent typographical errors.     Rene Paci, MD  Resident  09/29/20 [phone]

## 2020-09-29 NOTE — Unmapped (Signed)
Pt was being walked out post test per Madelaine Bhat, CNT and got down hallway from PET and started having breathing issues of shortness of breath with expiratory wheezing noted. Called a Rapid response code, tried restarting IV x3 but it blew and team arrived and pt was then transferred to ED. Also tried to give pt her epi pen but she refused and stated needed Benadryl and did get Benadryl 50 mg IV out of [facility] but was not given due to not able to re-establish IV.

## 2020-09-29 NOTE — Unmapped (Signed)
ED Attending Attestation Note    Date of service:  09/29/2020    This patient was seen by the resident physician.  I have seen and examined the patient, agree with the workup, evaluation, management and diagnosis. The care plan has been discussed and I concur.     My assessment reveals a 60 y.o. female with a past medical history of atrial fibrillation, WPW, mast cell degranulation syndrome who presents from stress test today for shortness of breath and wheezing.  Patient was having stress test performed and started develop increasing shortness of breath and wheezing.  She says that this feels consistent with prior episodes of her mast cell degranulation syndrome.  Rapid response was called.  She received 50 mg of IM Benadryl in transit to the ED.  She denies chest pain.  No rashes or swelling noted.    On exam patient is afebrile, hemodynamically stable, with labored breathing and audible wheezing.  No appreciable edema.  She has normal phonation when she speaks.  No urticaria.  Trachea is midline.  She has breath sounds bilaterally.    Upon arrival in the [facility]U 0.3 mg IM epinephrine was administered in addition to Pepcid, Solu-Medrol, and additional Benadryl.  Patient much improved after these interventions.    CRITICAL CARE TIME    I have seen and examined this patient and provided 30 minutes of critical care time exclusive of separately billed procedures and treating other patients.  Critical care was necessary to treat or prevent imminent or life threatening deterioration of the following condition(s): anaphylaxis    Critical care time was spent personally by me on the following activities: blood draw for specimens, evaluation of patient's resonse to treatment, ordering and performing treatments and interventions, ordering and review of laboratory studies, re-evaluation of patient's condition and review of old charts.

## 2020-09-29 NOTE — Unmapped (Signed)
Bed: A10U  Expected date:   Expected time:   Means of arrival:   Comments:  sru

## 2020-09-29 NOTE — Unmapped (Signed)
Responded as part of the rapid response team to room SR2A/SR2AU.  Assisted the with the respiratory needs of the patient Emily Bonilla.

## 2020-09-29 NOTE — Unmapped (Addendum)
Thank for trusting [place] to take care of you today.  Please return the emergency department if you have new or worsening symptoms, chest pain, shortness of breath, passout, or develop new numbness, weakness, or tingling.  Please follow-up with your primary care provider within the next week for emergency department follow-up.  There are numbers been provided in these discharge instructions.

## 2020-09-30 ENCOUNTER — Ambulatory Visit: Admit: 2020-09-30 | Discharge: 2020-09-30 | Payer: PRIVATE HEALTH INSURANCE | Attending: Audiologist

## 2020-09-30 ENCOUNTER — Inpatient Hospital Stay: Admit: 2020-09-30 | Payer: PRIVATE HEALTH INSURANCE

## 2020-09-30 DIAGNOSIS — Z1231 Encounter for screening mammogram for malignant neoplasm of breast: Secondary | ICD-10-CM

## 2020-09-30 DIAGNOSIS — H903 Sensorineural hearing loss, bilateral: Secondary | ICD-10-CM

## 2020-09-30 NOTE — Unmapped (Signed)
Called and reviewed patient's stress test.  She is followed by Dr. Pete Pelt (cardiology fellow, help appreciated), and will call the fellow's clinic for a follow up visit.

## 2020-09-30 NOTE — Unmapped (Signed)
Yolette Hable was seen today for [facility].       Aid Make and Model # :   ??  Right:  Widex Moment 220  Serial #:  [id number]  ??  Left:  Widex Moment 220  Serial #:  [id number]  ??  Warranty Expiration Date:  Repair:  10/07/23  Warranty Expiration date:  L&D:  10/07/23  ??  Battery Size:  Rechargeable  30 Day Return Deadline: 10/30/2020  ??  Chart Notes:   Fitting:    Adaptation set at step: 100%  Programs Established: Zen+ (Zen Aqua, Zen Aqua with noise, Zen noise)  Volume Control: Active  Startup delay: None  ??  All areas of care reviewed including  Insertion and removal, push button function, tinnitus programming/tinnitus management strategies, cleaning and maintenance (wax guards/tools), and rechargeable care.  ??  Provided with:   Cases for the devices, charging base, instruments for cleaning, wax guards, and instruction booklets and/or getting started guide.    ??  Payment:  Bill to [place]  ??  Recommended:   ??  Patient to return to office in 2 weeks time for follow up, or PRN for any problems  Patient will call to schedule hearing aid check as she is will be going out of town.

## 2020-09-30 NOTE — Unmapped (Signed)
Patient scheduled with Dr. Richardine Service on 9/22.

## 2020-09-30 NOTE — Unmapped (Signed)
PT had some testing done that came back abnormal.  PCP called and told her she needs to be seen right away.  Please Advise   [phone]

## 2020-09-30 NOTE — Unmapped (Signed)
Called Emily Bonilla to discuss PET stress test results. Discussed how there is decreased perfusion that is concerning for coronary disease in at least 2 arteries (RCA and LCx). Explained that typically we manage stable coronary disease with medical management and possibly stents if patient continues to have symptoms despite medical management. Spoke to patient about my concerns that we will not be able to optimize medical management due to her many allergies and mast cell activation syndrome. For instance, Emily Bonilla mentioned to me that she is worried about taking beta blockers because she heard it can lower the efficacy of her epi pen. She thinks she would be okay with nitrates and she had anaphylaxis with verapamil.   - Patient has an appointment with Dr. Richardine Service on 9/22 to further discuss medical management vs possible diagnostic coronary angiogram.   - Would appreciate Allergy/Immunology's input on medications that would be safe to use for CAD.       Patient was discussed with attending physician, Dr. Narda Amber.     Pete Pelt, MD  Cardiology Fellow  P: [phone]

## 2020-10-01 NOTE — Unmapped (Signed)
Called pt , did tell pt that the order are in and gave # to call and make appt

## 2020-10-06 ENCOUNTER — Inpatient Hospital Stay: Admit: 2020-10-06 | Payer: PRIVATE HEALTH INSURANCE | Attending: Diagnostic Radiology

## 2020-10-06 DIAGNOSIS — N6311 Unspecified lump in the right breast, upper outer quadrant: Secondary | ICD-10-CM

## 2020-10-09 ENCOUNTER — Ambulatory Visit: Admit: 2020-10-09 | Discharge: 2020-11-03 | Payer: PRIVATE HEALTH INSURANCE

## 2020-10-09 DIAGNOSIS — Z7689 Persons encountering health services in other specified circumstances: Secondary | ICD-10-CM

## 2020-10-09 MED ORDER — predniSONE (DELTASONE) 20 MG tablet
20 | ORAL_TABLET | Freq: Every day | ORAL | 0 refills | Status: AC
Start: 2020-10-09 — End: ?

## 2020-10-09 MED ORDER — clopidogreL (PLAVIX) 75 mg tablet
75 | ORAL_TABLET | Freq: Every day | ORAL | 3 refills | Status: AC
Start: 2020-10-09 — End: ?

## 2020-10-09 NOTE — Unmapped (Addendum)
Follow up in clinic with Dr. Welton Flakes.     An order has been put in for a Coronary CT. We will work on getting this scheduled for you.     A prescription for prednisone has been provided. You will need to take 20 mg each day for 5 days prior to your CT scan. Please ensure you are taking Benadryl per your Med list with prednisone.     Start taking Plavix 75 mg daily.

## 2020-10-09 NOTE — Unmapped (Signed)
[facility]  New Patient Visit    Chief Complaint: chest pain    HPI:     Emily Bonilla is a 60 y.o. female with past medical hx of WPW (unsuccessful ablation previously x2) , paroxysmal atrial fibrillation (not on anticoagulation), common variable immunodeficiency disease,??mast cell activation syndrome, chronic urticaria, recurrent anaphylaxis with??reported??past episode of cardiac arrest, T2DM on Trulicity, GERD, Hashimoto thyroiditis, hypoparathyroidism, anxiety, and depression.     Recent chest pain evaluation, abnormal stress test  Her history is always complicated by anaphylaxis    Patient continues to feel shortness of breath with walking up stairs. She said her shortness of breath and chest pain started in March. She feels chest pressure when getting up stairs. Patient has been trying to be more active, but is limited by her shortness of breath.      Has been feeling fluttering in chest for 30 years and had ablations in the past for her WPW. She has been waking up in the middle of the night with shortness of breath for past 6 weeks.     Past Medical and Sugical History:    Past Medical History:   Diagnosis Date   ??? Anaphylaxis    ??? Anemia    ??? Anxiety    ??? Asthma    ??? Atrial fibrillation (CMS Dx)    ??? Bronchitis    ??? Heart disease    ??? Neurological disease    ??? OSA (obstructive sleep apnea)     states resolved   ??? Osteoporosis    ??? Psychogenic nonepileptic seizure    ??? Seizures (CMS Dx)    ??? Spontaneous pneumothorax     states pneumomediatsium/pneumopericardium, self resolved   ??? Thyroid disease    ??? Ulcer    ??? WPW (Wolff-Parkinson-White syndrome)      Patient Active Problem List    Diagnosis Date Noted   ??? Dyspnea on exertion 08/29/2020   ??? Controlled type 2 diabetes mellitus with hyperglycemia, unspecified whether long term insulin use (CMS Dx) 02/06/2020   ??? Muscle weakness of lower extremity 06/28/2019   ??? Vertigo, benign positional, unspecified laterality 03/20/2018   ???  Tinnitus of both ears 03/20/2018   ??? Type 2 diabetes mellitus without complication, with long-term current use of insulin (CMS Dx) 06/29/2017   ??? Hyperthyroidism 06/29/2017   ??? Weight gain 06/29/2017   ??? Vitamin D deficiency 06/29/2017   ??? Hyperparathyroidism (CMS Dx) 06/29/2017   ??? Obesity (BMI 30.0-34.9) 06/29/2017   ??? Poor venous access 06/29/2017   ??? CVID (common variable immunodeficiency) (CMS Dx) 06/06/2017   ??? Caries 04/08/2017   ??? Intractable seizures (CMS Dx) 10/28/2016   ??? Spells of decreased attentiveness    ??? Seizure (CMS Dx)    ??? Hypothyroidism due to Hashimoto's thyroiditis 08/05/2016   ??? History of asthma    ??? Painful orthopaedic hardware (CMS Dx) 06/24/2016   ??? Anxiety and depression 03/31/2016   ??? 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 Multiple allergies 03/30/2016   ??? Syncope and collapse 03/30/2016      Testing   No resolved problems to display.       Social History:     Social History     Socioeconomic History   ??? Marital status: Divorced     Spouse name: Not on file   ??? Number of children: Not on file   ??? Years of education: Not on file   ???  Highest education level: Not on file   Occupational History   ??? Not on file   Tobacco Use   ??? Smoking status: Never Smoker   ??? Smokeless tobacco: Never Used   ??? Tobacco comment: Denies (09/29/2020)   Vaping Use   ??? Vaping Use: Never used   Substance and Sexual Activity   ??? Alcohol use: No     Comment: Denies (09/29/2020)   ??? Drug use: No     Comment: Denise (09/29/2020)   ??? Sexual activity: Not Currently   Other Topics Concern   ??? Caffeine Use Yes     Comment: 1-2 Cups Coffee Daily   ??? Occupational Exposure No   ??? Exercise Yes     Comment: walking and resistance bands daily   ??? Seat Belt Yes   Social History Narrative   ??? Not on file     Social Determinants of Health     Financial Resource Strain: Not on file   Physical Activity: Not on file   Stress: Not on file   Social Connections: Not on file   Housing Stability:  Not on file       Family History:   Family History   Problem Relation Age of Onset   ??? Kidney failure Mother    ??? Aneurysm Mother        Allergies:   Allergies   Allergen Reactions   ??? Acetaminophen Anaphylaxis     Unknown   ??? Adhesive Tape-Silicones Itching, Rash, Swelling and Anaphylaxis     Unknown   ??? Alcohol Anaphylaxis   ??? Aminophylline Anaphylaxis     Medium reaction   ??? Amitriptyline Anaphylaxis   ??? Aspirin Anaphylaxis     Unknown   ??? Benzoic Acid Anaphylaxis   ??? Blue Dye Anaphylaxis     Unknown   ??? Flecainide Shortness Of Breath and Anaphylaxis     Itching, decreased libido, wheezing, flushing, numbness.    ??? Gabapentin Anaphylaxis   ??? Iodinated Contrast Media Anaphylaxis   ??? Isopropyl Alcohol Anaphylaxis   ??? Latex Anaphylaxis and Rash   ??? Levofloxacin Itching and Anaphylaxis   ??? Lidocaine Anaphylaxis   ??? Nsaids (Non-Steroidal Anti-Inflammatory Drug) Anaphylaxis   ??? Onion Anaphylaxis   ??? 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   ??? Paraben Anaphylaxis   ??? Perfume Anaphylaxis   ??? Pregabalin Anaphylaxis   ??? Preservative Anaphylaxis   ??? Quetiapine Anaphylaxis     Mental Status Change  Other reaction(s): Angioedema   ??? Red Dye Anaphylaxis     Unknown   ??? Sulfa (Sulfonamide Antibiotics) Anaphylaxis   ??? Sulfur Anaphylaxis     Pt states: Any alcohol product   ??? Trazodone Rash and Anaphylaxis   ??? Verapamil Anaphylaxis   ??? Yellow Dye Anaphylaxis     Unknown   ??? Green Dye Rash     Other reaction(s): Contact Dermatitis   ??? Onion Extract Other (See Comments)   ??? Doxepin Other (See Comments)     Sedation and sleep paralysis (side effect)   ??? Duloxetine      Other reaction(s): Other (See Comments)  Mental Status Change   ??? Epi  E-Z Pen    ??? Hydroxyzine Itching  Unknown   ??? Statins-Hmg-Coa Reductase Inhibitors    ??? Verapamil (Bulk) Hives     Insomnia, flushing, scattered brain, migraine, itching, anxiety.        Physical examination:    -   Vitals:    10/09/20 1418   BP: 100/65   Pulse: 81   Resp: 16   SpO2: 98%       -   Wt Readings from Last 3 Encounters:   10/09/20 197 lb 9.6 oz (89.6 kg)   09/17/20 202 lb 3.2 oz (91.7 kg)   08/28/20 203 lb (92.1 kg)       GENERAL - Patient AAOx4 in no acute distress  HEENT - EOMI, PERRL, no scleral icterus. Oral mucosa pink and moist  NECK - Supple. No lymphadenopathy. No JVD.  CARDIOVASCULAR - RR, no murmur/rubs/gallops.  No lower extremity edema  PULSES - Pedal pulses 2+ bilaterally and symmetric  LUNGS - CTA bilaterally, good breath sounds. No wheezes or crackles  ABDOMEN - Soft, non-tender, non-distended. Normal BS.   SKIN: small areas of healed urticaria on arms    Labs:  Lab Results   Component Value Date    GLUCOSE 108 (H) 09/29/2020    GLUCOSE 113 (H) 09/29/2020    BUN 14 09/29/2020    CREATININE 0.96 09/29/2020    K 5.0 09/29/2020    K 4.9 09/29/2020    PHOS 2.9 08/29/2020    ALBUMIN 3.8 08/29/2020    EGFR 68 09/29/2020     Lab Results   Component Value Date    WBC 11.8 (H) 09/29/2020    HGB 14.0 09/29/2020    HCT 42.0 09/29/2020    MCV 82.1 09/29/2020    PLT 333 09/29/2020     Lab Results   Component Value Date    TSH 0.91 05/01/2020     Lab Results   Component Value Date    CKTOTAL 108 05/31/2017    TROPONINI <0.04 05/29/2016     Lab Results   Component Value Date    CHOLTOT 188 08/12/2020    TRIG 204 (H) 08/12/2020    HDL 38 (L) 08/12/2020    LDL 109 08/12/2020       Cardiac workup:  EKG:  08/28/20: NSE, mild ST depression V2-V4    Last Echocardiogram:  Results for orders placed during the hospital encounter of 08/28/20    Echo 2D Complete (TTE)    Narrative  * [facility]*  [address]  [place], [place] [address]  [phone]    Transthoracic  Echocardiography    Patient:    Winnell, Bento  MR #:       [id number]  Account:  Study Date: 08/28/2020  Gender:     F  Age:        67  DOB:        March 29, 1960  Room:       [facility]    PERFORMING   Claretta Fraise, MD  SONOGRAPHER  Macswords, Henrene Pastor, Marissa  REFERRING    Jama, Marissa  ATTENDING    Matesic, Sterling Big    -------------------------------------------------------------------    Procedure:ECHO 2D COMPLETE (TTE)              Order: Accession Number:[id number]    -------------------------------------------------------------------  Indications:      Chest Pain, unspecified.    -------------------------------------------------------------------  Risk factors:  systemic mastocytosis/mast cell activation syndrome,  common variable immunodeficiency syndrome, cardiac arrest and  anaphylaxis, type 2 diabetes mellitus, intermittent asthma, GERD,  focal hepatic adenoma, fatty liver, anxiety, depression,  hashimoto's thyroiditis, non-epileptic seizures, hypoparathyroidism,  and remote history of WPW who was called for direct admission by  PCP, Dr. Alpha Gula, with chest pain concerning for angina.    -------------------------------------------------------------------  Study data:  Height: 67in. 170.2cm. Weight: 200.6lb. 91.2kg. No  prior study was available for comparison.  Study status:  Routine.  Procedure:  Transthoracic echocardiography. Image quality was fair.  Scanning was performed from the parasternal, apical, and subcostal  acoustic windows.          Transthoracic echocardiography. M-mode,  complete 2D, 3D, strain, complete spectral Doppler, and color  Doppler.  Birthdate:  Patient birthdate: 1960-05-08.  Age:  Patient  is 60yr old.  Sex:  Birth gender: female.  Body mass index:  BMI:  31.5kg/m^2.  Body surface area:    BSA: 2.31m^2.  Blood pressure:  143/74  Patient status:  Inpatient.  Study date:  Study date:  08/28/2020. Study time: 02:01 PM.   Location:  Bedside.    -------------------------------------------------------------------  Study Conclusions    - Left ventricle: The cavity size is normal. Wall thickness was increased in a pattern of mild LVH.  Systolic function was normal. Wall motion was normal; there were no regional wall motion  abnormalities. Doppler parameters are consistent with abnormal left ventricular relaxation (grade  1 diastolic dysfunction).  - Mitral valve: Mild regurgitation.  - Left atrium: The atrium is mildly dilated.  - Right ventricle: Systolic function was normal.  - Pericardium, extracardiac: A trivial pericardial effusion is identified. Features were not  consistent with tamponade physiology.    Impressions:  No previous study was available for comparison.    -------------------------------------------------------------------  Cardiac Anatomy    Left ventricle:    - The cavity size is normal. Wall thickness was increased in a pattern of mild LVH. Systolic  function was normal. Wall motion was normal; there were no regional wall motion abnormalities.    - Wall motion score: 1.00.    - Doppler parameters are consistent with abnormal left ventricular relaxation (grade 1 diastolic  dysfunction).    Aorta:   Aortic root:    - The aortic root is normal in size.    Aortic valve:    - Trileaflet; normal thickness leaflets. Mobility was not restricted.    Doppler:    - Transvalvular velocity is within the normal range. There is no stenosis. No regurgitation.    Mitral valve:    - Structurally normal valve. Mobility was not restricted.    Doppler:    - Transvalvular velocity is within the normal range. There is no evidence for stenosis. Mild  regurgitation.    Left atrium:    - The atrium is mildly dilated.    Pulmonary artery:    - The main pulmonary artery was normal-sized. Systolic pressure could not be accurately estimated.    Systemic veins:  Inferior vena cava: The vessel was normal in size. The  respirophasic diameter changes  were in the normal range (>= 50%),  consistent with normal central venous pressure.  Right ventricle:    - The cavity size is normal. Wall thickness is normal. Systolic function was normal.    Pulmonic valve:    Doppler:    - Transvalvular velocity is within the normal range. There is no evidence for stenosis. No  regurgitation.    Tricuspid valve:    -  Structurally normal valve.    Doppler:    - Transvalvular velocity is within the normal range. Trivial regurgitation.    Right atrium:    - The atrium is at the upper limits of normal in size.    Pericardium:    - A trivial pericardial effusion is identified.    Doppler:  Features were not consistent with tamponade physiology.  Systemic veins:  Inferior vena cava:    - The vessel was normal in size. The respirophasic diameter changes were in the normal range (>=  50%), consistent with normal central venous pressure.  -    -------------------------------------------------------------------  Measurements    Left ventricle                  Value           Ref  GLS, 2D                         -17.40 %        ----------  EDD, LAX               (N)      4.8    cm       3.8 - 5.2  ESD, LAX               (N)      3.3    cm       2.2 - 3.5  EDD/bsa, LAX           (N)      2.4    cm/m^2   2.3 - 3.1  ESD/bsa, LAX           (N)      1.6    cm/m^2   1.3 - 2.1  FS, LAX                (N)      31     %        27 - 45  FS, LAX chord          (N)      31     %        27 - 45  ESD major ax, A4C               6.7    cm       ----------  ESD/bsa major ax, A4C           3.3    cm/m^2   ----------  EDD minor ax, A4C               6.7    cm       ----------  EDD/bsa minor ax, A4C           3.3    cm/m^2   ----------  EDA, A4C                        26.5   cm^2     ----------  ESA, A4C                        14.9   cm^2     ----------  FAC, A4C                        44     %        ----------  EDD major ax, A2C               8.3    cm       ----------  EDD/bsa major ax, A2C           4.1     cm/m^2   ----------  EDA, A2C                        28.9   cm^2     ----------  ESA, A2C                        14.7   cm^2     ----------  FAC, A2C                        49     %        ----------  ESD                    (N)      3.3    cm       2.2 - 3.5  ESD/bsa                (N)      1.6    cm/m^2   1.3 - 2.1  PW, ED                 (H)      1.0    cm       0.6 - 0.9  IVS/PW, ED                      1               ----------  EDV                    (H)      108    ml       46 - 106  ESV                    (H)      44     ml       14 - 42  EF                     (N)      59     %        54 - 74  SV                              49     ml       ----------  EDV/bsa                (N)      53     ml/m^2   29 - 61  ESV/bsa                (N)      22     ml/m^2   8 - 24  SV/bsa                          24     ml/m^2   ----------  EDV, 1-p A2C           (  N)      83     ml       41 - 133  ESV, 1-p A2C           (N)      53     ml       10 - 54  EF, 1-p A2C            (N)      64     %        52 - 76  SV, 1-p A2C                     64     ml       ----------  EDV/bsa, 1-p A2C       (N)      41     ml/m^2   26 - 74  ESV/bsa, 1-p A2C       (N)      26     ml/m^2   6 - 30  SV/bsa, 1-p A2C                 31.5   ml/m^2   ----------  EDV, 1-p A4C           (N)      71     ml       48 - 140  ESV, 1-p A4C           (N)      31     ml       12 - 60  EF, 1-p A4C            (N)      56     %        46 - 78  SV, 1-p A4C                     75     ml       ----------  EDV/bsa, 1-p A4C       (N)      35     ml/m^2   30 - 82  ESV/bsa, 1-p A4C       (N)      15     ml/m^2   7 - 35  SV/bsa, 1-p A4C                 37     ml/m^2   ----------  EDV, 2-p               (N)      77     ml       46 - 106  ESV, 2-p               (N)      31     ml       14 - 42  EF, 2-p                (N)      60     %        54 - 74  SV, 2-p                         46     ml       ----------  EDV/bsa, 2-p           (  N)      38     ml/m^2   29 -  61  ESV/bsa, 2-p           (N)      15     ml/m^2   8 - 24  SV/bsa, 2-p                     19.6   ml/m^2   ----------  E', lat ann, TDI       (L)      5.1    cm/sec   >=10.0  E/e', lat ann, TDI              13              ----------  A', lat ann, TDI                9.0    cm/sec   ----------  E'/a', lat ann, TDI             0.57            ----------  S', lat ann, TDI                7.0    cm/sec   ----------  E', med ann, TDI       (N)      37.5   cm/sec   >=7.0  E/e', med ann, TDI              2               ----------  A', med ann, TDI                10.7   cm/sec   ----------  E'/a', med ann, TDI             3.5             ----------  S', med ann, TDI                8.1    cm/sec   ----------  E', avg, TDI                    21.3   cm/sec   ----------  E/e', avg, TDI         (N)      3               <=14    LVOT                            Value           Ref  Diam, S                         2.0    cm       ----------  Area                            3.1    cm^2     ----------  Peak vel, S                     0.91   m/sec    ----------  Peak grad, S  3      mm Hg    ----------  SV                              49     ml       ----------  SV/bsa                          24     ml/m^2   ----------    Ventricular septum              Value           Ref  IVS, ED                (H)      1.0    cm       0.6 - 0.9    Right ventricle                 Value           Ref  EDD minor ax, A4C base (L)      2.4    cm       2.5 - 4.1  TAPSE, MM              (N)      2.4    cm       1.7 - 3.1  S' lateral             (N)      11.3   cm/sec   6.0 - 13.4    RVOT                            Value           Ref  Peak v, S                       0.66   m/sec    ----------    Left atrium                     Value           Ref  AP dim, ES             (H)      4.0    cm       2.7 - 3.8  AP dim index           (N)      2.0    cm/m^2   1.5 - 2.3  Area ES, A4C           (N)      17     cm^2     <=20  Area ES, A2C                     16     cm^2     ----------  SI dim, A2C                     4.6    cm       ----------  Vol, ES, 1-p A4C       (N)      45     ml       22 - 52  Vol/bsa, ES, 1-p A4C   (N)      22     ml/m^2   11 - 40  Vol, ES, 1-p A2C       (N)      49     ml       22 - 52  Vol/bsa, ES, 1-p A2C   (N)      24     ml/m^2   13 - 40  Vol, ES, 2-p                    51     ml       ----------  Vol/bsa, ES, 2-p       (N)      25     ml/m^2   16 - 34    Right atrium                    Value           Ref  Area, ES, A4C          (L)      7      cm^2     10 - 18    Aortic valve                    Value           Ref  Peak v, S                       1.7    m/sec    ----------  Peak grad, S                    11     mm Hg    ----------  LVOT/AV, Vpeak ratio            0.54            ----------  AVA, Vmax                       1.7    cm^2     ----------  AVA/bsa, Vmax                   0.84   cm^2/m^2 ----------    Mitral valve                    Value           Ref  Peak E                          0.67   m/sec    ----------  Peak A                          1.04   m/sec    ----------  Decel slope                     256    cm/s^2   ----------  Decel time                      263    ms       ----------  PHT  77     ms       ----------  Peak E/A ratio                  0.6             ----------  MVA, PHT                        2.9    cm^2     ----------  MVA/bsa, PHT                    1.41   cm^2/m^2 ----------    Pulmonic valve                  Value           Ref  Peak v, S                       0.7    m/sec    ----------    Aortic root                     Value           Ref  Root diam              (N)      3.3    cm       <4.2  S-T junct diam, ED     (N)      2.6    cm       2.0 - 3.2  S-T junct diam/bsa, ED (N)      1.3    cm/m^2   1.1 - 1.9    Ascending aorta                 Value           Ref  AAo AP diam, S                  3.1    cm       ----------  AAo AP diam/bsa, S              1.5    cm/m^2    ----------    Inferior vena cava              Value           Ref  Diam                            1.5    cm       ----------    Legend:  (L)  and  (H)  mark values outside specified reference range.    (N)  marks values inside specified reference range.    Reviewed and confirmed by    Doreene Eland, MD  2022-08-11T16:30:45      Last Stress test:  No results found for this or any previous visit.       Last Cardiac MRI test:  09/12/2020  IMPRESSIONS:     Overall there is normal right and left ventricular size, function, and wall motion.     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     Recommend patient returns for contrasted portion of study to risk stratify presence of mitral  annular disjunction.     1. The left ventricular size is normal. The left ventricular ejection fraction is 57 % by Simpson's method. Global left ventricular function is normal. There are no regional wall motion abnormalities of the left ventricular wall.     2. There is no evidence of increased iron deposition within the myocardium (T2*myocardium = 33 msec; if < 20 msec, suggestive of iron-overloading of the myocardium).     3. The right ventricular size is normal. Global right ventricular function is normal.     4. Left atrial size is normal. Right atrial size is normal.     5. Velocity-encoded phase contrast Imaging in (2D/4D) was performed. The Qp/Qs is 1.1 by phase contrast imaging and is not consistent with evidence of shunt.     6. Velocity-encoded phase contrast Imaging in (2D/4D) was performed.   Peak velocity at the aortic valve is 1.3 corresponding to a peak gradient of 6.76 mmHg. There is trivial aortic regurgitation. The calculated aortic regurgitant fraction is 1.69 %.   There is mild mitral regurgitation. The calculated mitral regurgitant fraction is 18.06 %. There is mild prolapse of the posterior mitral valve leaflet and evidence of  mitral annular disjunction.   There is ??tricuspid regurgitation. The calculated tricuspid regurgitant fraction is 3.13 %. The calculated pulmonic regurgitant fraction is 0.0 %.     7. 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      Imp)    1. Chest pain  2. Abnormal stress test  3. Very significant history of Immunologic disorder/ Anaphylaxis/ mast cell disorder  4. Anxiety  5. DM  6. Hx of arrythmias/ WPW/ ablations in past  7. Mixed hyperlipidemia  8 Polypharmacy      Plan)    Considering her significant history of mast cell disorder, and anaphylaxis issues, I would like to consider Coronary CT angiogram first to see if any significant issues    I discussed issues related to cath, risks, benefit    She is also in process of relocation to [place]    She tells she cannot take asa    Will start plavix  Arrange CCTA ( she will need anaphylaxis treatment)    Continue zetia ( side effects from statins)    Follow-up with results    RTC: follow-up Dr Welton Flakes      Time spent 60 minutes    Collier Flowers MD  [facility]  Interventional Cardiology    Answers for HPI/ROS submitted by the patient on 10/06/2020  Appetite Loss: No  Chills: Yes  Excessive sweating: Yes  Fever: No  Fatigue: Yes  Night sweats: No  Weight gain: No  Weight loss: Yes  Blurred vision: Yes  Discharge: No  Double vision: Yes  Eye pain : No  Photophobia/Light sensitive: Yes  Redness: No  Left eye vision loss: No  Right eye vision loss: No  Visual disturbances (i.e. blurred vision): Yes  Visual halos: No  Cough: No  Coughing blood: No  Shortness of breath: Yes  Sleep disturbances caused by breathing complications: Yes  Snoring: No  Production of sputum: No  Wheezing: No  Changes in nail beds: Yes  Skin discoloration: Yes  Skin dryness: Yes  Skin flushing: Yes  Itching: Yes  Poor wound healing: Yes  Rash: Yes  Skin cancer: No  Lesions: No  Unusual hair distribution: Yes  Congestion: No  Ear discharge: Yes  Ear pain: Yes  Hearing loss: Yes  Hoarseness:  Yes  Nosebleeds: No  Painful to swallow: No  Sore throat: No  Stridor: No  Tinnitus (ringing in ear): Yes  Chest pain: Yes  Pain in your leg that occurs when you walk or exercise.: No  Bluish/grayish skin discoloration of mucous membranes: No  Unable to catch your breath during physical activity.: Yes  Irregular heartbeats: Yes  Leg swelling: No  Near-fainting: Yes  Shortness of breath while laying down. : Yes  Palpitations: Yes  Waking up in the night due to shortness of breath. : Yes  Fainting or loss of consciousness: Yes  Intolerance of cold : Yes  Intolerance of heat: Yes  Excessive thirst or drinking of fluids.: No  Excessive hunger: No  Excessive urination: No  Adenopathy (swollen lymph nodes): No  Bleeding: No  Prone to bruising and bleeding easily : Yes  Arthritis: No  Back pain: Yes  Falls: Yes  Gout: Yes  Joint pain: Yes  Joint swelling: No  Muscle cramps: Yes  Muscle weakness: Yes  Myalgia (muscle pain/soreness): Yes  Neck pain: Yes  Stiffness: No  Abdominal bloating: Yes  Abdominal pain: No  Anorexia: No  Changes in bowel habit: No  Bowel incontinence: No  Constipation: No  Diarrhea: Yes  Difficulty swallowing food or liquid: No  Excessive appetite: No  Flatus (gas): Yes  Heartburn: Yes  Vomiting blood : No  Blood in stool: No  Hemorrhoids: Yes  Jaundice: No  Black tarry stool: No  Nausea: No  Vomiting: No  Bladder incontinence: No  Reduced sex drive: No  Discomfort or painful urination: No  Flank pain: No  Frequent urination: No  Genital sore: No  Blood in urine: No  Difficulty starting or maintaining a urine stream: No  Incomplete emptying: No  Heavy mentruel bleeding: No  Missed menses: No  Frequently waking up at night to urinate: No  Non-menstrual bleeding: No  Pelvic pain: Yes  Urgency to urinate: No  Inability to speak due to damaged vocal cords. : No  Brief paralysis: Yes  Difficulty concentrating: Yes  Disturbances in coordination: Yes  Daytime sleepiness: Yes  Dizziness: Yes  Weakness in  specific regions of the body: No  Generalized weakness (weakness throughout the body) : No  Headaches: Yes  Light-headedness: Yes  Loss of balance: Yes  Numbness: No  Tingling sensations: No  Seizures: Yes  Sensory changes: No  Tremors: No  Vertigo: Yes  Altered mental status : No  Depression: Yes  Hallucinations: No  Hypervigilance (state of increased alertness and anxiety) : Yes  Insomnia: No  Memory loss: Yes  Nervous or anxious: Yes  Substance abuse: No  Thoughts of suicide: No  Thoughts of violence: No  Environmental allergies: Yes  HIV exposure: No  Hives: Yes  Persistent infections: Yes

## 2020-10-09 NOTE — Unmapped (Signed)
Reviewed AVS with patient and noted I would contact when I could set up CCTA with Anesthesia presence and follow up. Discussed that Patient would have to do 5 days of PO prednisone prior to imaging per Dr. Richardine Service. Patient notes she will be relocated to [place] by then and she will just establish with cardiology in [place]. She notes she will pick up plavix if cleared by her immunologist. RN discussed with patient she could call office if plans change. Patient verbalized understanding.

## 2020-10-15 ENCOUNTER — Ambulatory Visit: Payer: PRIVATE HEALTH INSURANCE | Attending: Student in an Organized Health Care Education/Training Program

## 2020-10-17 NOTE — Unmapped (Signed)
Pt called she is moving to [place]. She needs a detailed referral for Oncology and detailed Ov notes sent to Dr. Cori Razor via fax @ [phone] ASAP

## 2020-10-17 NOTE — Unmapped (Signed)
Please advise on which OV would be best , also when referral is done please advise I can print and fax as requested

## 2020-10-20 NOTE — Unmapped (Addendum)
LM for pt to call office ,Please advise on office visit and referral

## 2020-10-20 NOTE — Unmapped (Signed)
Patient returning call to [facility]. RN.     Patient can be reached at [phone].

## 2020-10-20 NOTE — Unmapped (Signed)
Patient is requesting an update.    Patient can be reached at [phone].

## 2020-10-20 NOTE — Unmapped (Signed)
Pt returning call , she has appt tomorrow with a local Larita Fife) MD internal med , she will transfer care to the local PCP from [place] , # fir medical record given to pt to be able to get record. Pt wanted to make sure to let MD know thank you for the care in the past 5 years ,

## 2020-10-31 ENCOUNTER — Ambulatory Visit: Payer: PRIVATE HEALTH INSURANCE | Attending: Family

## 2020-11-10 ENCOUNTER — Ambulatory Visit: Payer: PRIVATE HEALTH INSURANCE | Attending: Gastroenterology

## 2020-12-24 ENCOUNTER — Ambulatory Visit: Payer: PRIVATE HEALTH INSURANCE | Attending: Student in an Organized Health Care Education/Training Program

## 2021-05-06 NOTE — Unmapped (Signed)
Last office visit was Visit date not found  No future appointments.       Upcoming Health Maintenance     Hepatitis C Screening (MyChart) (Once)  Overdue - never done    Depression Monitoring (PHQ-9) (Every 3 Months)  Overdue - never done    Annual Medicare Wellness Visit (Yearly)  Overdue - never done    Comprehensive Physical Exam (Yearly)  Overdue - never done    Immunization: COVID-19 (1)  Overdue - never done    Immunization: Pneumococcal (1 - PCV)  Overdue - never done    HIV Screening (Once)  Overdue - never done    Immunization: DTaP/Tdap/Td (1 - Tdap)  Overdue - never done    Immunization: Zoster (1 of 2)  Overdue - never done    Colorectal Cancer Screening (MyChart) (Colonoscopy - Every 10 Years)  Overdue - never done    Diabetic Foot Exam (MyChart) (Yearly)  Overdue - never done    Diabetic Eye Exam (MyChart) (Yearly)  Overdue since 11/10/2019    Urine Albumin/Creatinine Ratio (Yearly)  Overdue since 10/08/2020    Hemoglobin A1C Monitoring (MyChart) (Every 6 Months)  Overdue since 02/12/2021    Thyroid Function/TSH (MyChart) (Yearly)  Due since 05/01/2021    Renal Function/GFR (Yearly)  Next due on 09/29/2021    Mammogram (MyChart) (Every 2 Years)  Next due on 10/07/2022    Osteoporosis Screening (DXA Scan) (Every 5 Years)  Ordered on 09/18/2020    Lipid Panel (Every 5 Years)  Next due on 08/12/2025    Immunization: Influenza (MyChart)   Discontinued
